# Patient Record
Sex: Female | Born: 1945 | Race: White | Hispanic: No | Marital: Married | Smoking: Former smoker
Health system: Southern US, Community
[De-identification: ages and names within clinical notes are randomized; demographics above are authoritative.]

## PROBLEM LIST (undated history)

## (undated) ENCOUNTER — Ambulatory Visit (HOSPITAL_COMMUNITY): Admission: EM | Discharge status: Absent without leave | Payer: Medicare Other

## (undated) DIAGNOSIS — L719 Rosacea, unspecified: Secondary | ICD-10-CM

## (undated) DIAGNOSIS — F419 Anxiety disorder, unspecified: Secondary | ICD-10-CM

## (undated) DIAGNOSIS — I499 Cardiac arrhythmia, unspecified: Secondary | ICD-10-CM

## (undated) DIAGNOSIS — E039 Hypothyroidism, unspecified: Secondary | ICD-10-CM

## (undated) DIAGNOSIS — R269 Unspecified abnormalities of gait and mobility: Secondary | ICD-10-CM

## (undated) DIAGNOSIS — D759 Disease of blood and blood-forming organs, unspecified: Secondary | ICD-10-CM

## (undated) DIAGNOSIS — I4891 Unspecified atrial fibrillation: Secondary | ICD-10-CM

## (undated) DIAGNOSIS — I10 Essential (primary) hypertension: Secondary | ICD-10-CM

## (undated) DIAGNOSIS — C50919 Malignant neoplasm of unspecified site of unspecified female breast: Secondary | ICD-10-CM

## (undated) DIAGNOSIS — D649 Anemia, unspecified: Secondary | ICD-10-CM

## (undated) DIAGNOSIS — J45909 Unspecified asthma, uncomplicated: Secondary | ICD-10-CM

## (undated) DIAGNOSIS — J349 Unspecified disorder of nose and nasal sinuses: Secondary | ICD-10-CM

## (undated) DIAGNOSIS — C787 Secondary malignant neoplasm of liver and intrahepatic bile duct: Secondary | ICD-10-CM

## (undated) DIAGNOSIS — J189 Pneumonia, unspecified organism: Secondary | ICD-10-CM

## (undated) DIAGNOSIS — K759 Inflammatory liver disease, unspecified: Secondary | ICD-10-CM

## (undated) DIAGNOSIS — M1990 Unspecified osteoarthritis, unspecified site: Secondary | ICD-10-CM

## (undated) DIAGNOSIS — F32A Depression, unspecified: Secondary | ICD-10-CM

## (undated) HISTORY — PX: COLONOSCOPY: SHX174

## (undated) HISTORY — PX: APPENDECTOMY: SHX54

## (undated) HISTORY — PX: THYROIDECTOMY: SHX17

## (undated) HISTORY — DX: Unspecified abnormalities of gait and mobility: R26.9

## (undated) HISTORY — PX: EYE SURGERY: SHX253

## (undated) HISTORY — DX: Secondary malignant neoplasm of liver and intrahepatic bile duct: C78.7

## (undated) HISTORY — PX: ABDOMINAL HYSTERECTOMY: SHX81

## (undated) HISTORY — PX: BREAST LUMPECTOMY: SHX2

## (undated) HISTORY — DX: Rosacea, unspecified: L71.9

## (undated) HISTORY — PX: TONSILLECTOMY: SUR1361

## (undated) HISTORY — DX: Unspecified atrial fibrillation: I48.91

## (undated) MED FILL — Trastuzumab-anns For IV Soln 150 MG: INTRAVENOUS | Qty: 18 | Status: AC

## (undated) MED FILL — Trastuzumab-anns For IV Soln 420 MG: INTRAVENOUS | Qty: 18 | Status: AC

---

## 1988-01-21 DIAGNOSIS — C541 Malignant neoplasm of endometrium: Secondary | ICD-10-CM

## 1988-01-21 HISTORY — DX: Malignant neoplasm of endometrium: C54.1

## 1997-10-22 DIAGNOSIS — S42409A Unspecified fracture of lower end of unspecified humerus, initial encounter for closed fracture: Secondary | ICD-10-CM | POA: Insufficient documentation

## 1997-10-22 HISTORY — DX: Unspecified fracture of lower end of unspecified humerus, initial encounter for closed fracture: S42.409A

## 2001-02-16 ENCOUNTER — Encounter: Admission: RE | Admit: 2001-02-16 | Discharge: 2001-02-16 | Payer: Self-pay | Admitting: Specialist

## 2001-02-16 ENCOUNTER — Encounter: Payer: Self-pay | Admitting: Specialist

## 2002-08-30 ENCOUNTER — Encounter (INDEPENDENT_AMBULATORY_CARE_PROVIDER_SITE_OTHER): Payer: Self-pay | Admitting: *Deleted

## 2002-08-30 ENCOUNTER — Encounter: Admission: RE | Admit: 2002-08-30 | Discharge: 2002-08-30 | Payer: Self-pay | Admitting: *Deleted

## 2002-08-30 ENCOUNTER — Other Ambulatory Visit: Admission: RE | Admit: 2002-08-30 | Discharge: 2002-08-30 | Payer: Self-pay | Admitting: Radiology

## 2002-09-13 ENCOUNTER — Encounter: Admission: RE | Admit: 2002-09-13 | Discharge: 2002-09-13 | Payer: Self-pay | Admitting: *Deleted

## 2002-09-13 ENCOUNTER — Encounter (INDEPENDENT_AMBULATORY_CARE_PROVIDER_SITE_OTHER): Payer: Self-pay | Admitting: *Deleted

## 2002-09-13 ENCOUNTER — Ambulatory Visit (HOSPITAL_BASED_OUTPATIENT_CLINIC_OR_DEPARTMENT_OTHER): Admission: RE | Admit: 2002-09-13 | Discharge: 2002-09-13 | Payer: Self-pay | Admitting: *Deleted

## 2002-09-27 ENCOUNTER — Ambulatory Visit: Admission: RE | Admit: 2002-09-27 | Discharge: 2002-10-08 | Payer: Self-pay | Admitting: Radiation Oncology

## 2002-10-10 ENCOUNTER — Encounter (HOSPITAL_COMMUNITY): Payer: Self-pay | Admitting: Oncology

## 2002-10-10 ENCOUNTER — Ambulatory Visit (HOSPITAL_COMMUNITY): Admission: RE | Admit: 2002-10-10 | Discharge: 2002-10-10 | Payer: Self-pay | Admitting: Oncology

## 2002-10-16 ENCOUNTER — Encounter (INDEPENDENT_AMBULATORY_CARE_PROVIDER_SITE_OTHER): Payer: Self-pay | Admitting: Cardiology

## 2002-10-16 ENCOUNTER — Ambulatory Visit: Admission: RE | Admit: 2002-10-16 | Discharge: 2002-10-16 | Payer: Self-pay | Admitting: Oncology

## 2002-10-17 ENCOUNTER — Encounter (HOSPITAL_COMMUNITY): Payer: Self-pay | Admitting: Oncology

## 2002-10-17 ENCOUNTER — Ambulatory Visit (HOSPITAL_COMMUNITY): Admission: RE | Admit: 2002-10-17 | Discharge: 2002-10-17 | Payer: Self-pay | Admitting: Oncology

## 2002-10-24 DIAGNOSIS — C50919 Malignant neoplasm of unspecified site of unspecified female breast: Secondary | ICD-10-CM | POA: Insufficient documentation

## 2003-01-03 ENCOUNTER — Ambulatory Visit: Admission: RE | Admit: 2003-01-03 | Discharge: 2003-02-12 | Payer: Self-pay | Admitting: Radiation Oncology

## 2003-01-29 ENCOUNTER — Encounter: Admission: RE | Admit: 2003-01-29 | Discharge: 2003-01-29 | Payer: Self-pay | Admitting: Radiation Oncology

## 2004-02-26 ENCOUNTER — Encounter: Admission: RE | Admit: 2004-02-26 | Discharge: 2004-02-26 | Payer: Self-pay | Admitting: *Deleted

## 2004-05-05 ENCOUNTER — Ambulatory Visit (HOSPITAL_COMMUNITY): Admission: RE | Admit: 2004-05-05 | Discharge: 2004-05-05 | Payer: Self-pay | Admitting: Oncology

## 2004-07-13 ENCOUNTER — Ambulatory Visit: Payer: Self-pay | Admitting: Oncology

## 2004-09-10 ENCOUNTER — Ambulatory Visit: Payer: Self-pay | Admitting: Oncology

## 2004-11-16 ENCOUNTER — Ambulatory Visit: Payer: Self-pay | Admitting: Oncology

## 2005-01-14 ENCOUNTER — Ambulatory Visit: Payer: Self-pay | Admitting: Oncology

## 2005-03-08 ENCOUNTER — Encounter: Admission: RE | Admit: 2005-03-08 | Discharge: 2005-03-08 | Payer: Self-pay | Admitting: Internal Medicine

## 2005-03-21 ENCOUNTER — Encounter: Admission: RE | Admit: 2005-03-21 | Discharge: 2005-03-21 | Payer: Self-pay | Admitting: Specialist

## 2005-03-21 ENCOUNTER — Ambulatory Visit: Payer: Self-pay | Admitting: Oncology

## 2005-05-24 ENCOUNTER — Ambulatory Visit: Payer: Self-pay | Admitting: Oncology

## 2005-07-26 ENCOUNTER — Ambulatory Visit: Payer: Self-pay | Admitting: Oncology

## 2005-09-05 ENCOUNTER — Ambulatory Visit: Payer: Self-pay | Admitting: Oncology

## 2005-09-27 ENCOUNTER — Ambulatory Visit: Payer: Self-pay | Admitting: Oncology

## 2005-11-09 ENCOUNTER — Ambulatory Visit: Payer: Self-pay | Admitting: Oncology

## 2005-11-29 ENCOUNTER — Ambulatory Visit: Payer: Self-pay | Admitting: Oncology

## 2005-12-06 ENCOUNTER — Ambulatory Visit: Payer: Self-pay | Admitting: Oncology

## 2006-02-01 ENCOUNTER — Ambulatory Visit: Payer: Self-pay | Admitting: Oncology

## 2006-02-21 ENCOUNTER — Ambulatory Visit: Payer: Self-pay | Admitting: Oncology

## 2006-03-01 ENCOUNTER — Ambulatory Visit: Payer: Self-pay | Admitting: Oncology

## 2006-03-10 LAB — CBC WITH DIFFERENTIAL/PLATELET
BASO%: 0.2 % (ref 0.0–2.0)
Basophils Absolute: 0 10*3/uL (ref 0.0–0.1)
EOS%: 2.5 % (ref 0.0–7.0)
Eosinophils Absolute: 0.2 10*3/uL (ref 0.0–0.5)
HCT: 39.9 % (ref 34.8–46.6)
HGB: 13.5 g/dL (ref 11.6–15.9)
LYMPH%: 33 % (ref 14.0–48.0)
MCH: 31.3 pg (ref 26.0–34.0)
MCHC: 33.8 g/dL (ref 32.0–36.0)
MCV: 92.8 fL (ref 81.0–101.0)
MONO#: 0.6 10*3/uL (ref 0.1–0.9)
MONO%: 8.9 % (ref 0.0–13.0)
NEUT#: 3.6 10*3/uL (ref 1.5–6.5)
NEUT%: 55.4 % (ref 39.6–76.8)
Platelets: 166 10*3/uL (ref 145–400)
RBC: 4.3 10*6/uL (ref 3.70–5.32)
RDW: 12.9 % (ref 11.3–14.5)
WBC: 6.5 10*3/uL (ref 3.9–10.0)
lymph#: 2.1 10*3/uL (ref 0.9–3.3)

## 2006-03-10 LAB — COMPREHENSIVE METABOLIC PANEL
ALT: 19 U/L (ref 0–40)
AST: 25 U/L (ref 0–37)
Albumin: 4 g/dL (ref 3.5–5.2)
Alkaline Phosphatase: 56 U/L (ref 39–117)
BUN: 10 mg/dL (ref 6–23)
CO2: 29 mEq/L (ref 19–32)
Calcium: 8.5 mg/dL (ref 8.4–10.5)
Chloride: 98 mEq/L (ref 96–112)
Creatinine, Ser: 0.89 mg/dL (ref 0.40–1.20)
Glucose, Bld: 88 mg/dL (ref 70–99)
Potassium: 4.5 mEq/L (ref 3.5–5.3)
Sodium: 137 mEq/L (ref 135–145)
Total Bilirubin: 0.4 mg/dL (ref 0.3–1.2)
Total Protein: 6.4 g/dL (ref 6.0–8.3)

## 2006-03-10 LAB — LACTATE DEHYDROGENASE: LDH: 230 U/L (ref 94–250)

## 2006-03-10 LAB — CEA: CEA: 2.7 ng/mL (ref 0.0–5.0)

## 2006-03-10 LAB — CANCER ANTIGEN 27.29: CA 27.29: 40 U/mL — ABNORMAL HIGH (ref 0–39)

## 2006-03-13 ENCOUNTER — Encounter: Admission: RE | Admit: 2006-03-13 | Discharge: 2006-03-13 | Payer: Self-pay | Admitting: Internal Medicine

## 2006-05-16 ENCOUNTER — Ambulatory Visit: Payer: Self-pay | Admitting: Oncology

## 2006-06-15 ENCOUNTER — Encounter: Admission: RE | Admit: 2006-06-15 | Discharge: 2006-06-15 | Payer: Self-pay | Admitting: Specialist

## 2006-07-18 ENCOUNTER — Ambulatory Visit: Payer: Self-pay | Admitting: Oncology

## 2006-09-22 ENCOUNTER — Ambulatory Visit: Payer: Self-pay | Admitting: Oncology

## 2006-11-07 ENCOUNTER — Ambulatory Visit: Payer: Self-pay | Admitting: Oncology

## 2007-03-21 ENCOUNTER — Encounter: Admission: RE | Admit: 2007-03-21 | Discharge: 2007-03-21 | Payer: Self-pay | Admitting: Internal Medicine

## 2007-04-10 ENCOUNTER — Inpatient Hospital Stay (HOSPITAL_COMMUNITY): Admission: RE | Admit: 2007-04-10 | Discharge: 2007-04-14 | Payer: Self-pay | Admitting: Specialist

## 2007-05-17 ENCOUNTER — Ambulatory Visit: Payer: Self-pay | Admitting: Oncology

## 2007-07-27 ENCOUNTER — Ambulatory Visit: Payer: Self-pay | Admitting: Oncology

## 2007-10-25 ENCOUNTER — Ambulatory Visit: Payer: Self-pay | Admitting: Oncology

## 2008-04-16 ENCOUNTER — Encounter: Admission: RE | Admit: 2008-04-16 | Discharge: 2008-04-16 | Payer: Self-pay | Admitting: Internal Medicine

## 2008-04-30 ENCOUNTER — Ambulatory Visit: Payer: Self-pay | Admitting: Oncology

## 2008-09-19 ENCOUNTER — Encounter: Admission: RE | Admit: 2008-09-19 | Discharge: 2008-09-19 | Payer: Self-pay | Admitting: Specialist

## 2008-10-18 ENCOUNTER — Ambulatory Visit (HOSPITAL_COMMUNITY): Admission: RE | Admit: 2008-10-18 | Discharge: 2008-10-18 | Payer: Self-pay | Admitting: Specialist

## 2009-04-21 ENCOUNTER — Encounter: Admission: RE | Admit: 2009-04-21 | Discharge: 2009-04-21 | Payer: Self-pay | Admitting: Oncology

## 2009-08-22 HISTORY — PX: LUMBAR SPINE SURGERY: SHX701

## 2009-08-22 HISTORY — PX: THORACIC SPINE SURGERY: SHX802

## 2010-04-30 ENCOUNTER — Encounter: Admission: RE | Admit: 2010-04-30 | Discharge: 2010-04-30 | Payer: Self-pay | Admitting: Oncology

## 2010-09-12 ENCOUNTER — Encounter: Payer: Self-pay | Admitting: Internal Medicine

## 2010-09-12 ENCOUNTER — Encounter: Payer: Self-pay | Admitting: Specialist

## 2010-12-31 ENCOUNTER — Other Ambulatory Visit: Payer: Self-pay | Admitting: *Deleted

## 2010-12-31 DIAGNOSIS — M8580 Other specified disorders of bone density and structure, unspecified site: Secondary | ICD-10-CM

## 2011-01-04 ENCOUNTER — Ambulatory Visit
Admission: RE | Admit: 2011-01-04 | Discharge: 2011-01-04 | Disposition: A | Discharge status: Home / Self Care | Payer: Medicare Other | Source: Ambulatory Visit | Attending: *Deleted | Admitting: *Deleted

## 2011-01-04 ENCOUNTER — Other Ambulatory Visit: Payer: Self-pay | Admitting: Internal Medicine

## 2011-01-04 DIAGNOSIS — M8580 Other specified disorders of bone density and structure, unspecified site: Secondary | ICD-10-CM

## 2011-01-04 NOTE — Op Note (Signed)
Karen Allison, WHITEHAIR              ACCOUNT NO.:  [id number]   MEDICAL RECORD NO.:  [id number]          PATIENT TYPE:  INP   LOCATION:  5036                         FACILITY:  [facility]   PHYSICIAN:  Kerrin Champagne, M.D.   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   PROCEDURE:  1. Central laminectomy L4-5 and L5-S1.  2. Right L4 and right L5 foraminotomy.  3. Repair of dural tear right L5-S1 lateral thecal sac using 4-0      Nurolon and Tisseel.   SURGEON:  Kerrin Champagne, M.D.   ASSISTANT:  Boneta Lucks, CRNA   ANESTHESIA:  General via orotracheal intubation.   ANESTHESIOLOGIST:  Kaylyn Layer. Michelle Piper, M.D.   FINDINGS:  Severe lumbar spinal stenosis centrally at L4-5, moderate L5-  S1 with severe right lateral recess stenosis L5-S1 secondary to  hypertrophic changes involving the right L5-S1 facet and pressing upon  the L5 nerve root, neural foramen and lateral recess, and the lateral  aspect of the thecal sac at L5-S1 level.   SPECIMENS:  None.   ESTIMATED BLOOD LOSS:  200 cc.   COMPLICATIONS:  Dural tear, right, L5-S1 lateral aspect of the thecal  sac, primarily a bleb with arachnoid bulging significant, no significant  CSF leak.  Repaired with 4-0 Nurolon and Tisseel.  Location was just  below the right L5 neural foramen within the lateral recess entry point.  The patient returned to the PACU in good condition.   HISTORY OF PRESENT ILLNESS:  The patient is a 65 year old female whom I  have been followed for several years for the problem of worsening  neurogenic claudication associated with severe lumbar spinal stenosis at  L4-5 and L5-S1 at the lower end of a collapsing degenerative scoliosis.  She has been seen by several surgeons who have evaluated her and have  suggested long segment thoracolumbar fusion with decompression.  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  The primary portion of her  spinal stenosis and collapsing degenerative curve is in the mid to upper  lumbar segments so that a decompression is to be carried out primarily  with the lower 2 lumbar segments without fusion in the hopes of allowing  return of function and decreasing morbidity associated with the  procedure.   INTRAOPERATIVE FINDINGS:  As above, the patient did  have a dural tear on  the right side while decompressing the lateral recess.  This occurred  over the lateral aspect of the thecal sac, primarily a bleb in its  configuration repaired with 4-0 Nurolon and Tisseel.   PROCEDURE:  After adequate general anesthesia with the patient in a  prone position, and the Wilson frame was used as well as standard  reverse Skytron.  All pressure points were well padded.  She had a Foley  catheter placed prior to being turned into the prone position.  Intubation was achieved without difficulty.  TED hose and PAS stockings  were used to try to prevent deep venous thrombosis.  Standard prep with  DuraPrep solution from the mid-thoracic level to the mid-sacral segment.  Draped in the usual manner.  A Vi-drape was used.  Standard preoperative  antibiotics and Ancef given.   The patient underwent placement of the spinal  needles at the expected L4  and L5 levels.  The spinal needles were found to be present at the L3  level and at the L4 level.  The incision was then varied inferiorly  slightly, and the incision was a total of 3 to 3-1/2 inches in length  through the skin and subcutaneous layers down to the lumbodorsal fascia  incising both sides of the spinous processes of L3, L4, L5 and S1.  Clamps then were placed on the spinous process of L3 and L4.  Intraoperative lateral radiograph identified these levels.  They were  marked subscapularly with cautery to continue with our identification  throughout the remainder of the case.  Electrocautery and Cobbs then  used to elevate the paralumbar muscles off the posterior aspect of the  posterior elements extending from L3 to L4 to L5 and S1 both sides.  Bleeders were controlled using electrocautery.   A Boss-McCullough retractor inserted.  Leksell rongeur then used to  remove inferior 1/3 of the spinous process of L3, the entire spinous  process of L4 and of L5, thinning the central portions of the lamina to  L4 and L5 and resecting inferior portions bilaterally of the lamina of  L5 and of L4 on both sides.  A high-speed bur was used to further thin  the posterior elements as the lamina was quite thick on both sides.  This was then thinned and a 3-mm Kerrison was then introduced beneath  the lamina of L5 bilaterally and used to perform an opening in the  central lamina.  This was done using loupe magnification and a head  lamp.   We continued superiorly to the superior aspect of the L5 lamina and then  to the L4-5 level, to the L3-4 level.  Care was taken during this  portion of the procedure not to use large Kerrisons.  Three mm and 2-mm  Kerrisons were used for the initial debridement and the initial central  laminectomy.  The residual portions of the ligamentum flavum at the L5-  S1 level were then resected centrally on both sides off the medial   aspect of the facets.  Osteotomes were then used to perform partial  facetectomy on both the right and the left side in order to further  decompress the lateral recess on the right side and left side.  This was  continued superiorly to the L4 level.  Again central laminectomy was  carried superiorly using 3 and 2-mm Kerrisons.  The ligamentum flavum  was then debrided at the L4-5 level off the medial aspects of the  facets  on both sides, found to be quite hypertrophic and pressing upon the  thecal sac centrally.  At the L3-4 level the central laminectomy was  continued upwards, and the ligamentum flavum was debrided medially off  the medial aspect of the facet at the L3-4 level on both sides  completing the decompression centrally.  Gelfoam thrombin-soaked was  used for hemostasis as well as bone wax applied to the cancellous bone  surfaces.   Foraminotomy was then performed on the right side at the L3-4 level, at  the L4-5 level and L5-S1.  Foraminotomy was performed over the S1 nerve  root and then the lateral recess decompressed on the right side at L5-S1  during the process of this removing the medial aspect of the facet and  hypertrophic ligamentum flavum a dural rent occurred involving primarily  the dura mater along the right side between both the L5 and the S1 nerve  roots.  This involved primarily the outer layers of the dura.  It did  not appear to involve any neural elements.  This was repaired by  bringing in the operating room microscope.  Nurolon 4-0 stay stitches  were then placed distal and proximal to the dural opening and then  sutures in individual simple fashion using 4-0 Nurolon were used to  close the dural tear in the dura mater.  This completed then, Valsalva  was performed at 30 mmHg and no leakage was found to be present.   The continuation of the foraminotomy was then carried out on the right  side using the 2-mm and 3-mm Kerrison over the right L5 nerve root   resecting the superior articular process of S1 as it impinged on the L5  nerve root.  When decompression was completed, a hockey stick neural  probe could be passed out over the posterior aspect of the L5 nerve root  indicating that it was well decompressed.  Continuing up the right side  to the L4-5 level then, the foraminotomy was performed over the right L4  nerve root decompressing the lateral recess at the L4-5 level and then  resecting the superior articular process of L5 with the neural foramen  decompressing the right L4 nerve root such that a hockey stick neural  probe could be easily passed out the neural foramen over the L4 nerve  root indicating patency of the neural foramen well decompressed.  At L3-4 the lateral recess was further decompressed.  Bone wax was  applied to the cancellous bone surfaces.  Excess bone wax was removed.  Gelfoam irrigation was performed carefully.   Careful hemostasis was obtained using thrombin-soaked Gelfoam.  All  Gelfoam was then removed.  There was no bleeding evident.  Tisseel was  then applied to the posterior right side of the thecal sac at the area  of the dural tear extending from the neural foramen of L5 to the mid-  area between the S1 nerve root and the L5 nerve root, and over the  posterior aspect of the dural sac.  Note that normal pulsation was  occurring in the CSF fluid at this point.  Following application of  Tisseel, again Valsalva was performed and there was no evidence of leak  present.   This completed the surgical decompression and repair of the dural tear  present.  The Boss- McCullough retractor was removed.  Bleeding was  controlled using regular electrocautery.  When the area was totally dry,  then the paralumbar muscles were reapproximated in  the midline with  interrupted #1 Vicryl sutures.  The lumbodorsal fascia was approximated  in the midline with interrupted simple and figure-of-eight sutures of #1  Vicryl.  The  deep subcutaneous layers were approximated with interrupted  #1 and #0 Vicryl sutures, the more superficial layers with interrupted 2-  0 Vicryl sutures, and the skin was closed with a running subcuticular  stitch of 4-0 Vicryl.  Dermabond was then applied, 4 x 4's and ABD pad  fixed to the skin with Ibufix tape.  The patient was then returned to a  supine position, reactivated, extubated and returned to the recovery  room in satisfactory condition.   Note that all instrument and sponge counts were correct.  Note the  operating room microscope was used during the final portions of the  procedure including foraminotomy as well as repair of dural tear of  right L5-S1.      Kerrin Champagne, M.D.  Electronically Signed     JEN/MEDQ  D:  04/10/2007  T:  04/11/2007  Job:  [id number]

## 2011-01-07 NOTE — Discharge Summary (Signed)
Karen Allison, Karen Allison              ACCOUNT NO.:  [id number]   MEDICAL RECORD NO.:  [id number]          PATIENT TYPE:  INP   LOCATION:  5036                         FACILITY:  [facility]   PHYSICIAN:  Kerrin Champagne, M.D.   DATE OF BIRTH:  1945/10/02   DATE OF ADMISSION:  04/10/2007  DATE OF DISCHARGE:  04/14/2007                               DISCHARGE SUMMARY   ADMISSION DIAGNOSES:  1. Severe lumbar spinal stenosis, L4-5, L5-S1 with right L4 and right      L5 neural foraminal stenosis.  2. Breast cancer status post lumpectomy with metastatic disease of the      liver.  3. Uterine cancer status post hysterectomy.  4. Status post stab total thyroidectomy.  5. Hypertension.  6. Chronic pain syndrome  7. Migraine headaches.  8. Depression  9. Osteopenia.  10.Gastroesophageal reflux disease.  11.Urinary retention.   DISCHARGE DIAGNOSES:  1. Severe lumbar spinal stenosis, L4-5 and L5-S1, with right L4 and L5      neural foraminal stenosis with lateral recess  Stenosis, severe, with right-sided L5-S1 dural tear, right lateral L5-S1  thecal sac just below the L5 neural foramen, lateral aspect of the  thecal sac involving primarily the dura mater not the arachnoid layer.  1. Breast cancer status post lumpectomy with metastatic disease of the      liver.  2. Uterine cancer status post hysterectomy.  3. Status post stab total thyroidectomy.  4. Hypertension.  5. Chronic pain syndrome  6. Migraine headaches.  7. Depression  8. Osteopenia.  9. Gastroesophageal reflux disease.  10.Urinary retention.  11.Posthemorrhagic anemia.   PROCEDURE:  On April 10, 2007, the patient underwent central  laminectomy, L4-5 and L5-S1.  Right L4 and right L5 foraminotomy.  Repair of dural tear, right L5-S1, lateral thecal sac using 4-0 Nurolon  and Tisseel, performed by Dr. Otelia Sergeant under general anesthesia.   CONSULTATIONS:  None.   BRIEF HISTORY:  The patient is a 65 year old female with worsening  neurogenic claudication associated with severe lumbar spinal stenosis at  L4-5 and L5-S1 at the lower end of a collapsing degenerative scoliosis.  The patient has been evaluated and has also been advised that a long  segment lower columnar fusion with decompression may be helpful for her  problems.  However, she did not desire long segment fusion.  She has  been experiencing problems with incontinence and increasing severe right  leg pain with weakness in the right leg and foot.  It was felt that she  would benefit from a decompression primarily at the lower two lumbar  segments without fusion in hopes of allowing return of function and  decreasing morbidity associated with the procedure.  The patient wished  to proceed and was admitted for this procedure.   BRIEF HOSPITAL COURSE:  The patient tolerated the procedure under  general anesthesia without complications.  The patient was started on  physical therapy for ambulation and gait training on the first  postoperative day.  She also received occupational therapies for ADLs.  The patient's hemoglobin and hematocrit postoperatively were 10 and 30,  respectively.  Values continued to drift downward to the lowest value of  9.2 and 29, respectively.  She was placed on Trinsicon and did not  require blood transfusion.  The patient's dressing was changed on the  second postoperative day, and her wound was found to be healing without  drainage, erythema, or edema throughout the hospital stay.  The patient  was able to have a bowel movement on the first postoperative day, and  her diet was advanced after this.  No nausea or vomiting during the  hospital stay, and she did tolerate a regular diet.  Foley catheter was  discontinued, and the patient was able to void without difficulty.  Prior to discharge home, the patient was seen by the physical therapist  for ambulating stairs.  She was able to do so independently.  All goals  were met from  occupational and physical therapy prior to discharge.  She  was discharged to home in stable condition on April 14, 2007.   PERTINENT LABORATORY VALUES:  Hemoglobin and hematocrit, as stated  above.  Chemistry studies on admission were within normal limits.  Postoperatively, BUN 5, remaining values normal.  On April 12, 2007,  CO2 33, BUN 3, remaining values within normal limits.   Chest x-ray on admission with no active cardiopulmonary disease,  thoracic spondylosis noted.   PLAN:  The patient was discharged to home.  She had all of the necessary  DME available at her home and therefore did not need any further items  ordered.  She was instructed to change her dressing daily.  She will be  allowed to shower.  No lifting for 6 weeks, no driving for 2 weeks.  She  is to avoid bending, lifting, or twisting.  She will resume a regular  diet.  Follow up with Dr. Otelia Sergeant 2 weeks from the date of surgery.   DISCHARGE MEDICATIONS:  1. OxyContin 20 mg p.o. q.12h., #60.  2. OxyIR 5 mg 1 every 4-6 hours as needed for breakthrough pain, #60.  3. Robaxin 500 mg 1 every 8 hours as needed for spasm, #90 with 3      refills.   The patient received a medication reconciliation form and will continue  on all of her home medications as taken prior to admission.  She also  received a prescription for Trinsicon 1 p.o. daily.  All questions  encouraged and answered.      Wende Neighbors, P.A.      Kerrin Champagne, M.D.  Electronically Signed   SMV/MEDQ  D:  06/25/2007  T:  06/26/2007  Job:  [id number]

## 2011-03-18 ENCOUNTER — Other Ambulatory Visit: Payer: Self-pay | Admitting: Orthopedic Surgery

## 2011-03-18 DIAGNOSIS — M4712 Other spondylosis with myelopathy, cervical region: Secondary | ICD-10-CM

## 2011-03-18 DIAGNOSIS — M48061 Spinal stenosis, lumbar region without neurogenic claudication: Secondary | ICD-10-CM

## 2011-03-24 ENCOUNTER — Ambulatory Visit
Admission: RE | Admit: 2011-03-24 | Discharge: 2011-03-24 | Disposition: A | Discharge status: Home / Self Care | Payer: BLUE CROSS/BLUE SHIELD | Source: Ambulatory Visit | Attending: Orthopedic Surgery | Admitting: Orthopedic Surgery

## 2011-03-24 ENCOUNTER — Ambulatory Visit
Admission: RE | Admit: 2011-03-24 | Discharge: 2011-03-24 | Disposition: A | Discharge status: Home / Self Care | Payer: Medicare Other | Source: Ambulatory Visit | Attending: Orthopedic Surgery | Admitting: Orthopedic Surgery

## 2011-03-24 DIAGNOSIS — M4712 Other spondylosis with myelopathy, cervical region: Secondary | ICD-10-CM

## 2011-03-24 DIAGNOSIS — M48061 Spinal stenosis, lumbar region without neurogenic claudication: Secondary | ICD-10-CM

## 2011-05-16 ENCOUNTER — Other Ambulatory Visit: Payer: Self-pay | Admitting: Oncology

## 2011-05-16 DIAGNOSIS — Z1231 Encounter for screening mammogram for malignant neoplasm of breast: Secondary | ICD-10-CM

## 2011-05-17 ENCOUNTER — Ambulatory Visit: Payer: Medicare Other

## 2011-06-03 LAB — BASIC METABOLIC PANEL
BUN: 3 — ABNORMAL LOW
BUN: 5 — ABNORMAL LOW
CO2: 27
CO2: 33 — ABNORMAL HIGH
Calcium: 7.9 — ABNORMAL LOW
Calcium: 8.1 — ABNORMAL LOW
Chloride: 102
Chloride: 104
Creatinine, Ser: 0.69
Creatinine, Ser: 0.77
GFR calc Af Amer: >60
GFR calc Af Amer: >60
GFR calc non Af Amer: >60
GFR calc non Af Amer: >60
Glucose, Bld: 117 — ABNORMAL HIGH
Glucose, Bld: 129 — ABNORMAL HIGH
Potassium: 3.9
Potassium: 4.1
Sodium: 135
Sodium: 138

## 2011-06-03 LAB — HEMOGLOBIN AND HEMATOCRIT, BLOOD
HCT: 26.7 — ABNORMAL LOW
HCT: 26.9 — ABNORMAL LOW
HCT: 30 — ABNORMAL LOW
HCT: 33.4 — ABNORMAL LOW
Hemoglobin: 10.2 — ABNORMAL LOW
Hemoglobin: 11.3 — ABNORMAL LOW
Hemoglobin: 9.2 — ABNORMAL LOW
Hemoglobin: 9.3 — ABNORMAL LOW

## 2011-06-06 ENCOUNTER — Ambulatory Visit
Admission: RE | Admit: 2011-06-06 | Discharge: 2011-06-06 | Disposition: A | Discharge status: Home / Self Care | Payer: Medicare Other | Source: Ambulatory Visit | Attending: Oncology | Admitting: Oncology

## 2011-06-06 DIAGNOSIS — Z1231 Encounter for screening mammogram for malignant neoplasm of breast: Secondary | ICD-10-CM

## 2011-06-06 LAB — CBC
HCT: 40.1
Hemoglobin: 13.6
MCHC: 33.9
MCV: 90.6
Platelets: 187
RBC: 4.43
RDW: 12.7
WBC: 6

## 2011-06-06 LAB — BASIC METABOLIC PANEL
BUN: 8
CO2: 31
Calcium: 9.3
Chloride: 104
Creatinine, Ser: 0.79
GFR calc Af Amer: >60
GFR calc non Af Amer: >60
Glucose, Bld: 90
Potassium: 5
Sodium: 140

## 2011-06-06 LAB — HEPATIC FUNCTION PANEL
ALT: 21
AST: 31
Albumin: 3.8
Alkaline Phosphatase: 75
Bilirubin, Direct: <0.1
Total Bilirubin: 0.7
Total Protein: 6.7

## 2011-06-06 LAB — TYPE AND SCREEN
ABO/RH(D): O NEG
Antibody Screen: NEGATIVE

## 2011-06-06 LAB — ABO/RH: ABO/RH(D): O NEG

## 2011-06-20 ENCOUNTER — Other Ambulatory Visit: Payer: Self-pay | Admitting: Neurology

## 2011-06-20 DIAGNOSIS — Z853 Personal history of malignant neoplasm of breast: Secondary | ICD-10-CM

## 2011-06-20 DIAGNOSIS — R269 Unspecified abnormalities of gait and mobility: Secondary | ICD-10-CM

## 2011-06-20 DIAGNOSIS — M545 Low back pain, unspecified: Secondary | ICD-10-CM

## 2011-06-23 ENCOUNTER — Ambulatory Visit
Admission: RE | Admit: 2011-06-23 | Discharge: 2011-06-23 | Disposition: A | Discharge status: Home / Self Care | Payer: Medicare Other | Source: Ambulatory Visit | Attending: Neurology | Admitting: Neurology

## 2011-06-23 DIAGNOSIS — R269 Unspecified abnormalities of gait and mobility: Secondary | ICD-10-CM

## 2011-06-23 DIAGNOSIS — M545 Low back pain, unspecified: Secondary | ICD-10-CM

## 2011-06-23 DIAGNOSIS — Z853 Personal history of malignant neoplasm of breast: Secondary | ICD-10-CM

## 2011-08-23 HISTORY — PX: ORIF HUMERUS FRACTURE: SHX2126

## 2011-08-26 DIAGNOSIS — C50919 Malignant neoplasm of unspecified site of unspecified female breast: Secondary | ICD-10-CM | POA: Diagnosis not present

## 2011-08-26 DIAGNOSIS — C787 Secondary malignant neoplasm of liver and intrahepatic bile duct: Secondary | ICD-10-CM | POA: Diagnosis not present

## 2011-08-26 DIAGNOSIS — Z5111 Encounter for antineoplastic chemotherapy: Secondary | ICD-10-CM | POA: Diagnosis not present

## 2011-08-30 DIAGNOSIS — N390 Urinary tract infection, site not specified: Secondary | ICD-10-CM | POA: Diagnosis not present

## 2011-08-30 DIAGNOSIS — E0789 Other specified disorders of thyroid: Secondary | ICD-10-CM | POA: Diagnosis not present

## 2011-08-30 DIAGNOSIS — IMO0002 Reserved for concepts with insufficient information to code with codable children: Secondary | ICD-10-CM | POA: Diagnosis not present

## 2011-08-30 DIAGNOSIS — I10 Essential (primary) hypertension: Secondary | ICD-10-CM | POA: Diagnosis not present

## 2011-08-30 DIAGNOSIS — Z79899 Other long term (current) drug therapy: Secondary | ICD-10-CM | POA: Diagnosis not present

## 2011-08-30 DIAGNOSIS — F329 Major depressive disorder, single episode, unspecified: Secondary | ICD-10-CM | POA: Diagnosis not present

## 2011-09-12 DIAGNOSIS — C549 Malignant neoplasm of corpus uteri, unspecified: Secondary | ICD-10-CM | POA: Diagnosis not present

## 2011-09-16 DIAGNOSIS — C50919 Malignant neoplasm of unspecified site of unspecified female breast: Secondary | ICD-10-CM | POA: Diagnosis not present

## 2011-09-16 DIAGNOSIS — Z5111 Encounter for antineoplastic chemotherapy: Secondary | ICD-10-CM | POA: Diagnosis not present

## 2011-09-16 DIAGNOSIS — C787 Secondary malignant neoplasm of liver and intrahepatic bile duct: Secondary | ICD-10-CM | POA: Diagnosis not present

## 2011-09-21 DIAGNOSIS — M48061 Spinal stenosis, lumbar region without neurogenic claudication: Secondary | ICD-10-CM | POA: Diagnosis not present

## 2011-09-21 DIAGNOSIS — M545 Low back pain, unspecified: Secondary | ICD-10-CM | POA: Diagnosis not present

## 2011-09-21 DIAGNOSIS — M542 Cervicalgia: Secondary | ICD-10-CM | POA: Diagnosis not present

## 2011-09-21 DIAGNOSIS — IMO0002 Reserved for concepts with insufficient information to code with codable children: Secondary | ICD-10-CM | POA: Diagnosis not present

## 2011-09-29 DIAGNOSIS — E0789 Other specified disorders of thyroid: Secondary | ICD-10-CM | POA: Diagnosis not present

## 2011-10-07 DIAGNOSIS — C787 Secondary malignant neoplasm of liver and intrahepatic bile duct: Secondary | ICD-10-CM | POA: Diagnosis not present

## 2011-10-07 DIAGNOSIS — C50919 Malignant neoplasm of unspecified site of unspecified female breast: Secondary | ICD-10-CM | POA: Diagnosis not present

## 2011-10-07 DIAGNOSIS — Z5111 Encounter for antineoplastic chemotherapy: Secondary | ICD-10-CM | POA: Diagnosis not present

## 2011-10-11 DIAGNOSIS — N3020 Other chronic cystitis without hematuria: Secondary | ICD-10-CM | POA: Diagnosis not present

## 2011-10-11 DIAGNOSIS — N319 Neuromuscular dysfunction of bladder, unspecified: Secondary | ICD-10-CM | POA: Diagnosis not present

## 2011-10-11 DIAGNOSIS — N393 Stress incontinence (female) (male): Secondary | ICD-10-CM | POA: Diagnosis not present

## 2011-10-14 DIAGNOSIS — Z5111 Encounter for antineoplastic chemotherapy: Secondary | ICD-10-CM | POA: Diagnosis not present

## 2011-10-14 DIAGNOSIS — M47817 Spondylosis without myelopathy or radiculopathy, lumbosacral region: Secondary | ICD-10-CM | POA: Diagnosis not present

## 2011-10-14 DIAGNOSIS — C787 Secondary malignant neoplasm of liver and intrahepatic bile duct: Secondary | ICD-10-CM | POA: Diagnosis not present

## 2011-10-14 DIAGNOSIS — M949 Disorder of cartilage, unspecified: Secondary | ICD-10-CM | POA: Diagnosis not present

## 2011-10-14 DIAGNOSIS — Z981 Arthrodesis status: Secondary | ICD-10-CM | POA: Diagnosis not present

## 2011-10-14 DIAGNOSIS — M549 Dorsalgia, unspecified: Secondary | ICD-10-CM | POA: Diagnosis not present

## 2011-10-14 DIAGNOSIS — M899 Disorder of bone, unspecified: Secondary | ICD-10-CM | POA: Diagnosis not present

## 2011-10-14 DIAGNOSIS — C50919 Malignant neoplasm of unspecified site of unspecified female breast: Secondary | ICD-10-CM | POA: Diagnosis not present

## 2011-10-21 DIAGNOSIS — C50919 Malignant neoplasm of unspecified site of unspecified female breast: Secondary | ICD-10-CM | POA: Diagnosis not present

## 2011-10-21 DIAGNOSIS — Z5111 Encounter for antineoplastic chemotherapy: Secondary | ICD-10-CM | POA: Diagnosis not present

## 2011-10-21 DIAGNOSIS — C787 Secondary malignant neoplasm of liver and intrahepatic bile duct: Secondary | ICD-10-CM | POA: Diagnosis not present

## 2011-10-28 DIAGNOSIS — I369 Nonrheumatic tricuspid valve disorder, unspecified: Secondary | ICD-10-CM | POA: Diagnosis not present

## 2011-10-28 DIAGNOSIS — I059 Rheumatic mitral valve disease, unspecified: Secondary | ICD-10-CM | POA: Diagnosis not present

## 2011-10-28 DIAGNOSIS — C50919 Malignant neoplasm of unspecified site of unspecified female breast: Secondary | ICD-10-CM | POA: Diagnosis not present

## 2011-10-28 DIAGNOSIS — Z01810 Encounter for preprocedural cardiovascular examination: Secondary | ICD-10-CM | POA: Diagnosis not present

## 2011-11-11 DIAGNOSIS — Z5111 Encounter for antineoplastic chemotherapy: Secondary | ICD-10-CM | POA: Diagnosis not present

## 2011-11-11 DIAGNOSIS — C787 Secondary malignant neoplasm of liver and intrahepatic bile duct: Secondary | ICD-10-CM | POA: Diagnosis not present

## 2011-11-11 DIAGNOSIS — C50919 Malignant neoplasm of unspecified site of unspecified female breast: Secondary | ICD-10-CM | POA: Diagnosis not present

## 2011-11-16 DIAGNOSIS — D1801 Hemangioma of skin and subcutaneous tissue: Secondary | ICD-10-CM | POA: Diagnosis not present

## 2011-11-16 DIAGNOSIS — L570 Actinic keratosis: Secondary | ICD-10-CM | POA: Diagnosis not present

## 2011-11-16 DIAGNOSIS — B351 Tinea unguium: Secondary | ICD-10-CM | POA: Diagnosis not present

## 2011-12-02 DIAGNOSIS — Z5111 Encounter for antineoplastic chemotherapy: Secondary | ICD-10-CM | POA: Diagnosis not present

## 2011-12-02 DIAGNOSIS — C50919 Malignant neoplasm of unspecified site of unspecified female breast: Secondary | ICD-10-CM | POA: Diagnosis not present

## 2011-12-02 DIAGNOSIS — C787 Secondary malignant neoplasm of liver and intrahepatic bile duct: Secondary | ICD-10-CM | POA: Diagnosis not present

## 2011-12-23 DIAGNOSIS — C50919 Malignant neoplasm of unspecified site of unspecified female breast: Secondary | ICD-10-CM | POA: Diagnosis not present

## 2011-12-23 DIAGNOSIS — Z5111 Encounter for antineoplastic chemotherapy: Secondary | ICD-10-CM | POA: Diagnosis not present

## 2011-12-23 DIAGNOSIS — C787 Secondary malignant neoplasm of liver and intrahepatic bile duct: Secondary | ICD-10-CM | POA: Diagnosis not present

## 2011-12-29 DIAGNOSIS — IMO0002 Reserved for concepts with insufficient information to code with codable children: Secondary | ICD-10-CM | POA: Diagnosis not present

## 2011-12-29 DIAGNOSIS — I10 Essential (primary) hypertension: Secondary | ICD-10-CM | POA: Diagnosis not present

## 2011-12-29 DIAGNOSIS — F329 Major depressive disorder, single episode, unspecified: Secondary | ICD-10-CM | POA: Diagnosis not present

## 2011-12-29 DIAGNOSIS — E039 Hypothyroidism, unspecified: Secondary | ICD-10-CM | POA: Diagnosis not present

## 2012-01-04 DIAGNOSIS — R1011 Right upper quadrant pain: Secondary | ICD-10-CM | POA: Diagnosis not present

## 2012-01-04 DIAGNOSIS — R110 Nausea: Secondary | ICD-10-CM | POA: Diagnosis not present

## 2012-01-04 DIAGNOSIS — N390 Urinary tract infection, site not specified: Secondary | ICD-10-CM | POA: Diagnosis not present

## 2012-01-16 DIAGNOSIS — C787 Secondary malignant neoplasm of liver and intrahepatic bile duct: Secondary | ICD-10-CM | POA: Diagnosis not present

## 2012-01-16 DIAGNOSIS — Z5111 Encounter for antineoplastic chemotherapy: Secondary | ICD-10-CM | POA: Diagnosis not present

## 2012-01-16 DIAGNOSIS — C50919 Malignant neoplasm of unspecified site of unspecified female breast: Secondary | ICD-10-CM | POA: Diagnosis not present

## 2012-01-25 DIAGNOSIS — Z09 Encounter for follow-up examination after completed treatment for conditions other than malignant neoplasm: Secondary | ICD-10-CM | POA: Diagnosis not present

## 2012-01-25 DIAGNOSIS — C800 Disseminated malignant neoplasm, unspecified: Secondary | ICD-10-CM | POA: Diagnosis not present

## 2012-01-25 DIAGNOSIS — M4802 Spinal stenosis, cervical region: Secondary | ICD-10-CM | POA: Diagnosis not present

## 2012-01-25 DIAGNOSIS — C7800 Secondary malignant neoplasm of unspecified lung: Secondary | ICD-10-CM | POA: Diagnosis not present

## 2012-01-25 DIAGNOSIS — C50919 Malignant neoplasm of unspecified site of unspecified female breast: Secondary | ICD-10-CM | POA: Diagnosis not present

## 2012-02-03 DIAGNOSIS — C787 Secondary malignant neoplasm of liver and intrahepatic bile duct: Secondary | ICD-10-CM | POA: Diagnosis not present

## 2012-02-03 DIAGNOSIS — Z5111 Encounter for antineoplastic chemotherapy: Secondary | ICD-10-CM | POA: Diagnosis not present

## 2012-02-03 DIAGNOSIS — C50919 Malignant neoplasm of unspecified site of unspecified female breast: Secondary | ICD-10-CM | POA: Diagnosis not present

## 2012-02-24 DIAGNOSIS — Z5111 Encounter for antineoplastic chemotherapy: Secondary | ICD-10-CM | POA: Diagnosis not present

## 2012-02-24 DIAGNOSIS — C787 Secondary malignant neoplasm of liver and intrahepatic bile duct: Secondary | ICD-10-CM | POA: Diagnosis not present

## 2012-02-24 DIAGNOSIS — C50919 Malignant neoplasm of unspecified site of unspecified female breast: Secondary | ICD-10-CM | POA: Diagnosis not present

## 2012-03-16 DIAGNOSIS — C50919 Malignant neoplasm of unspecified site of unspecified female breast: Secondary | ICD-10-CM | POA: Diagnosis not present

## 2012-03-16 DIAGNOSIS — Z5111 Encounter for antineoplastic chemotherapy: Secondary | ICD-10-CM | POA: Diagnosis not present

## 2012-03-16 DIAGNOSIS — C787 Secondary malignant neoplasm of liver and intrahepatic bile duct: Secondary | ICD-10-CM | POA: Diagnosis not present

## 2012-04-03 DIAGNOSIS — E039 Hypothyroidism, unspecified: Secondary | ICD-10-CM | POA: Diagnosis not present

## 2012-04-03 DIAGNOSIS — R5381 Other malaise: Secondary | ICD-10-CM | POA: Diagnosis not present

## 2012-04-03 DIAGNOSIS — I10 Essential (primary) hypertension: Secondary | ICD-10-CM | POA: Diagnosis not present

## 2012-04-03 DIAGNOSIS — F329 Major depressive disorder, single episode, unspecified: Secondary | ICD-10-CM | POA: Diagnosis not present

## 2012-04-03 DIAGNOSIS — R5383 Other fatigue: Secondary | ICD-10-CM | POA: Diagnosis not present

## 2012-04-03 DIAGNOSIS — E785 Hyperlipidemia, unspecified: Secondary | ICD-10-CM | POA: Diagnosis not present

## 2012-04-06 DIAGNOSIS — C787 Secondary malignant neoplasm of liver and intrahepatic bile duct: Secondary | ICD-10-CM | POA: Diagnosis not present

## 2012-04-06 DIAGNOSIS — C50919 Malignant neoplasm of unspecified site of unspecified female breast: Secondary | ICD-10-CM | POA: Diagnosis not present

## 2012-04-06 DIAGNOSIS — Z5111 Encounter for antineoplastic chemotherapy: Secondary | ICD-10-CM | POA: Diagnosis not present

## 2012-04-27 DIAGNOSIS — C50919 Malignant neoplasm of unspecified site of unspecified female breast: Secondary | ICD-10-CM | POA: Diagnosis not present

## 2012-04-27 DIAGNOSIS — C787 Secondary malignant neoplasm of liver and intrahepatic bile duct: Secondary | ICD-10-CM | POA: Diagnosis not present

## 2012-05-07 DIAGNOSIS — R040 Epistaxis: Secondary | ICD-10-CM | POA: Diagnosis not present

## 2012-05-07 DIAGNOSIS — I10 Essential (primary) hypertension: Secondary | ICD-10-CM | POA: Diagnosis not present

## 2012-05-16 DIAGNOSIS — Z5111 Encounter for antineoplastic chemotherapy: Secondary | ICD-10-CM | POA: Diagnosis not present

## 2012-05-16 DIAGNOSIS — C787 Secondary malignant neoplasm of liver and intrahepatic bile duct: Secondary | ICD-10-CM | POA: Diagnosis not present

## 2012-05-16 DIAGNOSIS — C50919 Malignant neoplasm of unspecified site of unspecified female breast: Secondary | ICD-10-CM | POA: Diagnosis not present

## 2012-05-29 DIAGNOSIS — Z23 Encounter for immunization: Secondary | ICD-10-CM | POA: Diagnosis not present

## 2012-05-31 DIAGNOSIS — R040 Epistaxis: Secondary | ICD-10-CM | POA: Diagnosis not present

## 2012-06-11 DIAGNOSIS — E039 Hypothyroidism, unspecified: Secondary | ICD-10-CM | POA: Diagnosis not present

## 2012-06-11 DIAGNOSIS — I10 Essential (primary) hypertension: Secondary | ICD-10-CM | POA: Diagnosis not present

## 2012-06-11 DIAGNOSIS — F329 Major depressive disorder, single episode, unspecified: Secondary | ICD-10-CM | POA: Diagnosis not present

## 2012-06-11 DIAGNOSIS — M5137 Other intervertebral disc degeneration, lumbosacral region: Secondary | ICD-10-CM | POA: Diagnosis not present

## 2012-06-12 DIAGNOSIS — C50919 Malignant neoplasm of unspecified site of unspecified female breast: Secondary | ICD-10-CM | POA: Diagnosis not present

## 2012-06-12 DIAGNOSIS — C787 Secondary malignant neoplasm of liver and intrahepatic bile duct: Secondary | ICD-10-CM | POA: Diagnosis not present

## 2012-06-12 DIAGNOSIS — Z5111 Encounter for antineoplastic chemotherapy: Secondary | ICD-10-CM | POA: Diagnosis not present

## 2012-06-19 DIAGNOSIS — H18419 Arcus senilis, unspecified eye: Secondary | ICD-10-CM | POA: Diagnosis not present

## 2012-06-19 DIAGNOSIS — H2510 Age-related nuclear cataract, unspecified eye: Secondary | ICD-10-CM | POA: Diagnosis not present

## 2012-06-26 ENCOUNTER — Other Ambulatory Visit: Payer: Self-pay | Admitting: Internal Medicine

## 2012-06-26 DIAGNOSIS — Z1231 Encounter for screening mammogram for malignant neoplasm of breast: Secondary | ICD-10-CM

## 2012-07-06 DIAGNOSIS — Z5111 Encounter for antineoplastic chemotherapy: Secondary | ICD-10-CM | POA: Diagnosis not present

## 2012-07-06 DIAGNOSIS — C787 Secondary malignant neoplasm of liver and intrahepatic bile duct: Secondary | ICD-10-CM | POA: Diagnosis not present

## 2012-07-06 DIAGNOSIS — C50919 Malignant neoplasm of unspecified site of unspecified female breast: Secondary | ICD-10-CM | POA: Diagnosis not present

## 2012-07-08 ENCOUNTER — Emergency Department (HOSPITAL_COMMUNITY)
Admission: EM | Admit: 2012-07-08 | Discharge: 2012-07-08 | Disposition: A | Discharge status: Home Health | Payer: Medicare Other | Attending: Emergency Medicine | Admitting: Emergency Medicine

## 2012-07-08 ENCOUNTER — Encounter (HOSPITAL_COMMUNITY): Payer: Self-pay | Admitting: *Deleted

## 2012-07-08 ENCOUNTER — Emergency Department (HOSPITAL_COMMUNITY): Payer: Medicare Other

## 2012-07-08 DIAGNOSIS — E0500 Thyrotoxicosis with diffuse goiter without thyrotoxic crisis or storm: Secondary | ICD-10-CM | POA: Diagnosis not present

## 2012-07-08 DIAGNOSIS — Z853 Personal history of malignant neoplasm of breast: Secondary | ICD-10-CM | POA: Insufficient documentation

## 2012-07-08 DIAGNOSIS — W1809XA Striking against other object with subsequent fall, initial encounter: Secondary | ICD-10-CM | POA: Insufficient documentation

## 2012-07-08 DIAGNOSIS — S42213A Unspecified displaced fracture of surgical neck of unspecified humerus, initial encounter for closed fracture: Secondary | ICD-10-CM | POA: Insufficient documentation

## 2012-07-08 DIAGNOSIS — Z79899 Other long term (current) drug therapy: Secondary | ICD-10-CM | POA: Insufficient documentation

## 2012-07-08 DIAGNOSIS — S59909A Unspecified injury of unspecified elbow, initial encounter: Secondary | ICD-10-CM | POA: Diagnosis not present

## 2012-07-08 DIAGNOSIS — S42209A Unspecified fracture of upper end of unspecified humerus, initial encounter for closed fracture: Secondary | ICD-10-CM | POA: Diagnosis not present

## 2012-07-08 DIAGNOSIS — Y929 Unspecified place or not applicable: Secondary | ICD-10-CM | POA: Insufficient documentation

## 2012-07-08 DIAGNOSIS — I10 Essential (primary) hypertension: Secondary | ICD-10-CM | POA: Insufficient documentation

## 2012-07-08 DIAGNOSIS — M25539 Pain in unspecified wrist: Secondary | ICD-10-CM | POA: Diagnosis not present

## 2012-07-08 DIAGNOSIS — S6990XA Unspecified injury of unspecified wrist, hand and finger(s), initial encounter: Secondary | ICD-10-CM | POA: Diagnosis not present

## 2012-07-08 DIAGNOSIS — S4980XA Other specified injuries of shoulder and upper arm, unspecified arm, initial encounter: Secondary | ICD-10-CM | POA: Diagnosis not present

## 2012-07-08 DIAGNOSIS — S59919A Unspecified injury of unspecified forearm, initial encounter: Secondary | ICD-10-CM | POA: Diagnosis not present

## 2012-07-08 DIAGNOSIS — Y9301 Activity, walking, marching and hiking: Secondary | ICD-10-CM | POA: Insufficient documentation

## 2012-07-08 HISTORY — DX: Essential (primary) hypertension: I10

## 2012-07-08 HISTORY — DX: Malignant neoplasm of unspecified site of unspecified female breast: C50.919

## 2012-07-08 MED ORDER — IBUPROFEN 400 MG PO TABS
600.0000 mg | ORAL_TABLET | Freq: Once | ORAL | Status: AC
  Administered 2012-07-08: 600 mg via ORAL
  Filled 2012-07-08: qty 1

## 2012-07-08 MED ORDER — ONDANSETRON 4 MG PO TBDP
ORAL_TABLET | ORAL | Status: AC
  Administered 2012-07-08: 4 mg
  Filled 2012-07-08: qty 1

## 2012-07-08 MED ORDER — DOCUSATE SODIUM 100 MG PO CAPS: 100.0000 mg | ORAL_CAPSULE | Freq: Two times a day (BID) | ORAL | Status: DC

## 2012-07-08 MED ORDER — OXYCODONE-ACETAMINOPHEN 5-325 MG PO TABS: 1.0000 | ORAL_TABLET | ORAL | Status: DC | PRN

## 2012-07-08 MED ORDER — OXYCODONE-ACETAMINOPHEN 5-325 MG PO TABS
2.0000 | ORAL_TABLET | Freq: Once | ORAL | Status: AC
  Administered 2012-07-08: 2 via ORAL
  Filled 2012-07-08: qty 2

## 2012-07-08 NOTE — ED Notes (Signed)
Pt back to xray for wrist film. Ice pack applied to left shoulder

## 2012-07-08 NOTE — ED Notes (Signed)
Assisted to [facility]. Pt states she feels much better.

## 2012-07-08 NOTE — Progress Notes (Signed)
Orthopedic Tech Progress Note Patient Details:  Karen Allison Mar 15, 1946 [id number]  Ortho Devices Type of Ortho Device: Sling immobilizer Ortho Device/Splint Location: LEFT SLING IMMOBILIZER Ortho Device/Splint Interventions: Application   Cammer, Mickie Bail 07/08/2012, 1:23 PM

## 2012-07-08 NOTE — ED Notes (Signed)
Pt reports falling this am and hitting left shoulder on the door, having left arm and shoulder pain. Cms intact at triage.

## 2012-07-08 NOTE — ED Provider Notes (Signed)
History  This chart was scribed for Raeford Razor, MD by Ladona Ridgel Day, ED scribe. This patient was seen in room TR05C/TR05C and the patient's care was started at 1203.   CSN: [id number]  Arrival date & time 07/08/12  1203   First MD Initiated Contact with Patient 07/08/12 1302      Chief Complaint  Patient presents with  . Fall  . Shoulder Pain   Patient is a 66 y.o. female presenting with fall, shoulder pain, and shoulder injury. The history is provided by the patient. No language interpreter was used.  Fall The accident occurred 1 to 2 hours ago. The fall occurred while walking. She landed on a hard floor. There was no blood loss. The point of impact was the left knee, right knee and left shoulder. The pain is present in the left shoulder. The pain is moderate. She was ambulatory at the scene. There was no entrapment after the fall. Associated symptoms include numbness (numbness/tingling distal of left shoulder). Pertinent negatives include no fever, no abdominal pain, no nausea, no vomiting, no headaches and no loss of consciousness. The symptoms are aggravated by use of the injured limb. She has tried nothing for the symptoms.  Shoulder Pain Pertinent negatives include no chest pain, no abdominal pain, no headaches and no shortness of breath.  Shoulder Injury This is a new problem. The current episode started 1 to 2 hours ago. The problem occurs constantly. The problem has not changed since onset.Pertinent negatives include no chest pain, no abdominal pain, no headaches and no shortness of breath. Exacerbated by: use of injured limb. Nothing relieves the symptoms. She has tried nothing for the symptoms.    Past Medical History  Diagnosis Date  . Breast cancer   . Graves disease   . Hypertension     Past Surgical History  Procedure Date  . Lumpectomy   . Appendectomy   . Tonsillectomy   . Thyroidectomy   . Back surgery     History reviewed. No pertinent family  history.  History  Substance Use Topics  . Smoking status: Not on file  . Smokeless tobacco: Not on file  . Alcohol Use: No    OB History    Grav Para Term Preterm Abortions TAB SAB Ect Mult Living                  Review of Systems  Constitutional: Negative for fever and chills.  HENT: Negative for neck pain.   Respiratory: Negative for shortness of breath.   Cardiovascular: Negative for chest pain.  Gastrointestinal: Negative for nausea, vomiting and abdominal pain.  Musculoskeletal: Negative for back pain.       Left upper arm pain, bruising and splinting. Left wrist pain  Neurological: Positive for numbness (numbness/tingling distal of left shoulder). Negative for loss of consciousness, syncope, weakness and headaches.  All other systems reviewed and are negative.    Allergies  Review of patient's allergies indicates no known allergies.  Home Medications   Current Outpatient Rx  Name  Route  Sig  Dispense  Refill  . ATENOLOL 25 MG PO TABS   Oral   Take 25 mg by mouth daily.         . B COMPLEX PO   Oral   Take 1 capsule by mouth daily.         . BUPROPION HCL ER (XL) 150 MG PO TB24   Oral   Take 150 mg by mouth daily.         Marland Kitchen  CALCIUM PO   Oral   Take 2 tablets by mouth daily.         Marland Kitchen VITAMIN D 2000 UNITS PO CAPS   Oral   Take 1 capsule by mouth daily.         Marland Kitchen DIAZEPAM 10 MG PO TABS   Oral   Take 10 mg by mouth at bedtime as needed. For sleep         . OMEGA-3 FATTY ACIDS 1000 MG PO CAPS   Oral   Take 1 g by mouth daily.         Marland Kitchen FLUOXETINE HCL 20 MG PO CAPS   Oral   Take 20 mg by mouth daily.         Marland Kitchen GABAPENTIN 600 MG PO TABS   Oral   Take 600 mg by mouth 4 (four) times daily - after meals and at bedtime.         Marland Kitchen HYDROCODONE-ACETAMINOPHEN 7.5-325 MG PO TABS   Oral   Take 1 tablet by mouth every 6 (six) hours as needed. For pain         . KRILL OIL PO   Oral   Take 1 capsule by mouth daily.         Marland Kitchen  LEVOTHYROXINE SODIUM 50 MCG PO TABS   Oral   Take 50 mcg by mouth daily.         Marland Kitchen METHOCARBAMOL 500 MG PO TABS   Oral   Take 500 mg by mouth 4 (four) times daily.         . ADULT MULTIVITAMIN W/MINERALS CH   Oral   Take 1 tablet by mouth daily.           Triage Vitals: BP 140/62  Pulse 67  Temp 98.4 F (36.9 C) (Oral)  Resp 18  SpO2 97%  Physical Exam  Nursing note and vitals reviewed. Constitutional: She appears well-developed and well-nourished. No distress.  HENT:  Head: Normocephalic and atraumatic.  Eyes: Conjunctivae normal are normal. Right eye exhibits no discharge. Left eye exhibits no discharge.  Neck: Neck supple.  Cardiovascular: Normal rate, regular rhythm and normal heart sounds.  Exam reveals no gallop and no friction rub.   No murmur heard. Pulmonary/Chest: Effort normal and breath sounds normal. No respiratory distress.  Abdominal: Soft. She exhibits no distension. There is no tenderness.  Musculoskeletal: She exhibits tenderness. She exhibits no edema.       Deformity to proximal left humerus/shoulder with ecchymosis to left anterior shoulder. NVI distally. Tenderness to her left wrist. Right knee with no bony pain and no pain with ROM. Right calf is smaller in appearance than left calf.   Neurological: She is alert.  Skin: Skin is warm and dry.  Psychiatric: She has a normal mood and affect. Her behavior is normal. Thought content normal.    ED Course  Procedures (including critical care time) DIAGNOSTIC STUDIES: Oxygen Saturation is 97% on room air, normal by my interpretation.    COORDINATION OF CARE: At 115 PM Discussed treatment plan with patient which includes left shoulder X-ray, percocet, sling immobilizer. Patient agrees.   Labs Reviewed - No data to display Dg Wrist Complete Left  07/08/2012  *RADIOLOGY REPORT*  Clinical Data: Fall and left wrist pain.  LEFT WRIST - COMPLETE 3+ VIEW  Comparison: None.  Findings: Four views of the  wrist demonstrate severe joint space narrowing and degenerative changes at the first carpometacarpal joint.  There are also  extensive degenerative changes involving the thumb MCP joint and IP joint.  The carpal bones are intact.  No evidence for a wrist fracture.  IMPRESSION: No acute bony abnormality involving the wrist.  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   Original Report Authenticated By: Richarda Overlie, M.D.    Dg Shoulder Left  07/08/2012  *RADIOLOGY REPORT*  Clinical Data: Fall.  Left shoulder pain.  Bruising.  LEFT SHOULDER - 2+ VIEW  Comparison: None.  Findings: Comminuted proximal left humerus fracture is present. This is probably a four part fracture.  The shoulders internally rotated at the time of imaging.  The humeral head appears located on the scapular Y view.  Distal clavicle is intact.  Scapula grossly appears normal.  IMPRESSION: Comminuted proximal left humerus fracture, likely 4 part.   Original Report Authenticated By: Andreas Newport, M.D.      1. Fx humeral neck       MDM  66yF with L shoulder pain. Imaging significant for proximal L humerus fx. Closed injury. NVI intact. Plan sling and ortho FU.   I personally preformed the services scribed in my presence. The recorded information has been reviewed & is accurate. Raeford Razor, MD.          Raeford Razor, MD 07/08/12 [phone]

## 2012-07-08 NOTE — ED Notes (Signed)
Med given for c/o nausea during application of sling. States pain is better unless she is moving

## 2012-07-12 DIAGNOSIS — S42209A Unspecified fracture of upper end of unspecified humerus, initial encounter for closed fracture: Secondary | ICD-10-CM | POA: Diagnosis not present

## 2012-07-12 DIAGNOSIS — S42253A Displaced fracture of greater tuberosity of unspecified humerus, initial encounter for closed fracture: Secondary | ICD-10-CM | POA: Diagnosis not present

## 2012-07-12 DIAGNOSIS — M48061 Spinal stenosis, lumbar region without neurogenic claudication: Secondary | ICD-10-CM | POA: Diagnosis not present

## 2012-07-23 DIAGNOSIS — S42209A Unspecified fracture of upper end of unspecified humerus, initial encounter for closed fracture: Secondary | ICD-10-CM | POA: Diagnosis not present

## 2012-07-23 DIAGNOSIS — S42253A Displaced fracture of greater tuberosity of unspecified humerus, initial encounter for closed fracture: Secondary | ICD-10-CM | POA: Diagnosis not present

## 2012-07-23 DIAGNOSIS — M25539 Pain in unspecified wrist: Secondary | ICD-10-CM | POA: Diagnosis not present

## 2012-07-26 DIAGNOSIS — F329 Major depressive disorder, single episode, unspecified: Secondary | ICD-10-CM | POA: Diagnosis not present

## 2012-07-26 DIAGNOSIS — IMO0002 Reserved for concepts with insufficient information to code with codable children: Secondary | ICD-10-CM | POA: Diagnosis not present

## 2012-07-26 DIAGNOSIS — C50919 Malignant neoplasm of unspecified site of unspecified female breast: Secondary | ICD-10-CM | POA: Diagnosis not present

## 2012-07-26 DIAGNOSIS — S42309D Unspecified fracture of shaft of humerus, unspecified arm, subsequent encounter for fracture with routine healing: Secondary | ICD-10-CM | POA: Diagnosis not present

## 2012-07-26 DIAGNOSIS — IMO0001 Reserved for inherently not codable concepts without codable children: Secondary | ICD-10-CM | POA: Diagnosis not present

## 2012-07-27 DIAGNOSIS — C50919 Malignant neoplasm of unspecified site of unspecified female breast: Secondary | ICD-10-CM | POA: Diagnosis not present

## 2012-07-27 DIAGNOSIS — C787 Secondary malignant neoplasm of liver and intrahepatic bile duct: Secondary | ICD-10-CM | POA: Diagnosis not present

## 2012-07-27 DIAGNOSIS — Z5111 Encounter for antineoplastic chemotherapy: Secondary | ICD-10-CM | POA: Diagnosis not present

## 2012-07-30 DIAGNOSIS — F329 Major depressive disorder, single episode, unspecified: Secondary | ICD-10-CM | POA: Diagnosis not present

## 2012-07-30 DIAGNOSIS — C50919 Malignant neoplasm of unspecified site of unspecified female breast: Secondary | ICD-10-CM | POA: Diagnosis not present

## 2012-07-30 DIAGNOSIS — IMO0002 Reserved for concepts with insufficient information to code with codable children: Secondary | ICD-10-CM | POA: Diagnosis not present

## 2012-07-30 DIAGNOSIS — S42309D Unspecified fracture of shaft of humerus, unspecified arm, subsequent encounter for fracture with routine healing: Secondary | ICD-10-CM | POA: Diagnosis not present

## 2012-07-30 DIAGNOSIS — IMO0001 Reserved for inherently not codable concepts without codable children: Secondary | ICD-10-CM | POA: Diagnosis not present

## 2012-08-01 DIAGNOSIS — IMO0001 Reserved for inherently not codable concepts without codable children: Secondary | ICD-10-CM | POA: Diagnosis not present

## 2012-08-01 DIAGNOSIS — C50919 Malignant neoplasm of unspecified site of unspecified female breast: Secondary | ICD-10-CM | POA: Diagnosis not present

## 2012-08-01 DIAGNOSIS — S42309D Unspecified fracture of shaft of humerus, unspecified arm, subsequent encounter for fracture with routine healing: Secondary | ICD-10-CM | POA: Diagnosis not present

## 2012-08-01 DIAGNOSIS — F329 Major depressive disorder, single episode, unspecified: Secondary | ICD-10-CM | POA: Diagnosis not present

## 2012-08-01 DIAGNOSIS — IMO0002 Reserved for concepts with insufficient information to code with codable children: Secondary | ICD-10-CM | POA: Diagnosis not present

## 2012-08-02 DIAGNOSIS — S42309D Unspecified fracture of shaft of humerus, unspecified arm, subsequent encounter for fracture with routine healing: Secondary | ICD-10-CM | POA: Diagnosis not present

## 2012-08-02 DIAGNOSIS — F329 Major depressive disorder, single episode, unspecified: Secondary | ICD-10-CM | POA: Diagnosis not present

## 2012-08-02 DIAGNOSIS — C50919 Malignant neoplasm of unspecified site of unspecified female breast: Secondary | ICD-10-CM | POA: Diagnosis not present

## 2012-08-02 DIAGNOSIS — IMO0002 Reserved for concepts with insufficient information to code with codable children: Secondary | ICD-10-CM | POA: Diagnosis not present

## 2012-08-02 DIAGNOSIS — IMO0001 Reserved for inherently not codable concepts without codable children: Secondary | ICD-10-CM | POA: Diagnosis not present

## 2012-08-06 ENCOUNTER — Ambulatory Visit: Payer: Medicare Other

## 2012-08-06 DIAGNOSIS — S42209A Unspecified fracture of upper end of unspecified humerus, initial encounter for closed fracture: Secondary | ICD-10-CM | POA: Diagnosis not present

## 2012-08-07 DIAGNOSIS — F329 Major depressive disorder, single episode, unspecified: Secondary | ICD-10-CM | POA: Diagnosis not present

## 2012-08-07 DIAGNOSIS — IMO0002 Reserved for concepts with insufficient information to code with codable children: Secondary | ICD-10-CM | POA: Diagnosis not present

## 2012-08-07 DIAGNOSIS — IMO0001 Reserved for inherently not codable concepts without codable children: Secondary | ICD-10-CM | POA: Diagnosis not present

## 2012-08-07 DIAGNOSIS — C50919 Malignant neoplasm of unspecified site of unspecified female breast: Secondary | ICD-10-CM | POA: Diagnosis not present

## 2012-08-07 DIAGNOSIS — S42309D Unspecified fracture of shaft of humerus, unspecified arm, subsequent encounter for fracture with routine healing: Secondary | ICD-10-CM | POA: Diagnosis not present

## 2012-08-09 DIAGNOSIS — S42309D Unspecified fracture of shaft of humerus, unspecified arm, subsequent encounter for fracture with routine healing: Secondary | ICD-10-CM | POA: Diagnosis not present

## 2012-08-09 DIAGNOSIS — IMO0001 Reserved for inherently not codable concepts without codable children: Secondary | ICD-10-CM | POA: Diagnosis not present

## 2012-08-09 DIAGNOSIS — IMO0002 Reserved for concepts with insufficient information to code with codable children: Secondary | ICD-10-CM | POA: Diagnosis not present

## 2012-08-09 DIAGNOSIS — C50919 Malignant neoplasm of unspecified site of unspecified female breast: Secondary | ICD-10-CM | POA: Diagnosis not present

## 2012-08-09 DIAGNOSIS — F329 Major depressive disorder, single episode, unspecified: Secondary | ICD-10-CM | POA: Diagnosis not present

## 2012-08-10 DIAGNOSIS — C50919 Malignant neoplasm of unspecified site of unspecified female breast: Secondary | ICD-10-CM | POA: Diagnosis not present

## 2012-08-10 DIAGNOSIS — F329 Major depressive disorder, single episode, unspecified: Secondary | ICD-10-CM | POA: Diagnosis not present

## 2012-08-10 DIAGNOSIS — IMO0001 Reserved for inherently not codable concepts without codable children: Secondary | ICD-10-CM | POA: Diagnosis not present

## 2012-08-10 DIAGNOSIS — IMO0002 Reserved for concepts with insufficient information to code with codable children: Secondary | ICD-10-CM | POA: Diagnosis not present

## 2012-08-10 DIAGNOSIS — S42309D Unspecified fracture of shaft of humerus, unspecified arm, subsequent encounter for fracture with routine healing: Secondary | ICD-10-CM | POA: Diagnosis not present

## 2012-08-14 DIAGNOSIS — S42309D Unspecified fracture of shaft of humerus, unspecified arm, subsequent encounter for fracture with routine healing: Secondary | ICD-10-CM | POA: Diagnosis not present

## 2012-08-14 DIAGNOSIS — F329 Major depressive disorder, single episode, unspecified: Secondary | ICD-10-CM | POA: Diagnosis not present

## 2012-08-14 DIAGNOSIS — C50919 Malignant neoplasm of unspecified site of unspecified female breast: Secondary | ICD-10-CM | POA: Diagnosis not present

## 2012-08-14 DIAGNOSIS — IMO0002 Reserved for concepts with insufficient information to code with codable children: Secondary | ICD-10-CM | POA: Diagnosis not present

## 2012-08-14 DIAGNOSIS — IMO0001 Reserved for inherently not codable concepts without codable children: Secondary | ICD-10-CM | POA: Diagnosis not present

## 2012-08-16 DIAGNOSIS — IMO0002 Reserved for concepts with insufficient information to code with codable children: Secondary | ICD-10-CM | POA: Diagnosis not present

## 2012-08-16 DIAGNOSIS — C50919 Malignant neoplasm of unspecified site of unspecified female breast: Secondary | ICD-10-CM | POA: Diagnosis not present

## 2012-08-16 DIAGNOSIS — F329 Major depressive disorder, single episode, unspecified: Secondary | ICD-10-CM | POA: Diagnosis not present

## 2012-08-16 DIAGNOSIS — IMO0001 Reserved for inherently not codable concepts without codable children: Secondary | ICD-10-CM | POA: Diagnosis not present

## 2012-08-16 DIAGNOSIS — S42309D Unspecified fracture of shaft of humerus, unspecified arm, subsequent encounter for fracture with routine healing: Secondary | ICD-10-CM | POA: Diagnosis not present

## 2012-08-17 DIAGNOSIS — C787 Secondary malignant neoplasm of liver and intrahepatic bile duct: Secondary | ICD-10-CM | POA: Diagnosis not present

## 2012-08-17 DIAGNOSIS — Z5111 Encounter for antineoplastic chemotherapy: Secondary | ICD-10-CM | POA: Diagnosis not present

## 2012-08-17 DIAGNOSIS — C50919 Malignant neoplasm of unspecified site of unspecified female breast: Secondary | ICD-10-CM | POA: Diagnosis not present

## 2012-08-20 DIAGNOSIS — S42309D Unspecified fracture of shaft of humerus, unspecified arm, subsequent encounter for fracture with routine healing: Secondary | ICD-10-CM | POA: Diagnosis not present

## 2012-08-20 DIAGNOSIS — IMO0002 Reserved for concepts with insufficient information to code with codable children: Secondary | ICD-10-CM | POA: Diagnosis not present

## 2012-08-20 DIAGNOSIS — IMO0001 Reserved for inherently not codable concepts without codable children: Secondary | ICD-10-CM | POA: Diagnosis not present

## 2012-08-20 DIAGNOSIS — C50919 Malignant neoplasm of unspecified site of unspecified female breast: Secondary | ICD-10-CM | POA: Diagnosis not present

## 2012-08-20 DIAGNOSIS — F329 Major depressive disorder, single episode, unspecified: Secondary | ICD-10-CM | POA: Diagnosis not present

## 2012-08-24 DIAGNOSIS — F329 Major depressive disorder, single episode, unspecified: Secondary | ICD-10-CM | POA: Diagnosis not present

## 2012-08-24 DIAGNOSIS — S42309D Unspecified fracture of shaft of humerus, unspecified arm, subsequent encounter for fracture with routine healing: Secondary | ICD-10-CM | POA: Diagnosis not present

## 2012-08-24 DIAGNOSIS — IMO0001 Reserved for inherently not codable concepts without codable children: Secondary | ICD-10-CM | POA: Diagnosis not present

## 2012-08-24 DIAGNOSIS — IMO0002 Reserved for concepts with insufficient information to code with codable children: Secondary | ICD-10-CM | POA: Diagnosis not present

## 2012-08-24 DIAGNOSIS — C50919 Malignant neoplasm of unspecified site of unspecified female breast: Secondary | ICD-10-CM | POA: Diagnosis not present

## 2012-08-27 DIAGNOSIS — IMO0001 Reserved for inherently not codable concepts without codable children: Secondary | ICD-10-CM | POA: Diagnosis not present

## 2012-08-27 DIAGNOSIS — S42309D Unspecified fracture of shaft of humerus, unspecified arm, subsequent encounter for fracture with routine healing: Secondary | ICD-10-CM | POA: Diagnosis not present

## 2012-08-27 DIAGNOSIS — C50919 Malignant neoplasm of unspecified site of unspecified female breast: Secondary | ICD-10-CM | POA: Diagnosis not present

## 2012-08-27 DIAGNOSIS — IMO0002 Reserved for concepts with insufficient information to code with codable children: Secondary | ICD-10-CM | POA: Diagnosis not present

## 2012-08-27 DIAGNOSIS — F329 Major depressive disorder, single episode, unspecified: Secondary | ICD-10-CM | POA: Diagnosis not present

## 2012-08-29 DIAGNOSIS — S42209A Unspecified fracture of upper end of unspecified humerus, initial encounter for closed fracture: Secondary | ICD-10-CM | POA: Diagnosis not present

## 2012-08-29 DIAGNOSIS — S42253A Displaced fracture of greater tuberosity of unspecified humerus, initial encounter for closed fracture: Secondary | ICD-10-CM | POA: Diagnosis not present

## 2012-08-30 DIAGNOSIS — IMO0001 Reserved for inherently not codable concepts without codable children: Secondary | ICD-10-CM | POA: Diagnosis not present

## 2012-08-30 DIAGNOSIS — IMO0002 Reserved for concepts with insufficient information to code with codable children: Secondary | ICD-10-CM | POA: Diagnosis not present

## 2012-08-30 DIAGNOSIS — C50919 Malignant neoplasm of unspecified site of unspecified female breast: Secondary | ICD-10-CM | POA: Diagnosis not present

## 2012-08-30 DIAGNOSIS — S42309D Unspecified fracture of shaft of humerus, unspecified arm, subsequent encounter for fracture with routine healing: Secondary | ICD-10-CM | POA: Diagnosis not present

## 2012-08-30 DIAGNOSIS — F329 Major depressive disorder, single episode, unspecified: Secondary | ICD-10-CM | POA: Diagnosis not present

## 2012-09-04 DIAGNOSIS — C50919 Malignant neoplasm of unspecified site of unspecified female breast: Secondary | ICD-10-CM | POA: Diagnosis not present

## 2012-09-04 DIAGNOSIS — S42309D Unspecified fracture of shaft of humerus, unspecified arm, subsequent encounter for fracture with routine healing: Secondary | ICD-10-CM | POA: Diagnosis not present

## 2012-09-04 DIAGNOSIS — IMO0001 Reserved for inherently not codable concepts without codable children: Secondary | ICD-10-CM | POA: Diagnosis not present

## 2012-09-04 DIAGNOSIS — IMO0002 Reserved for concepts with insufficient information to code with codable children: Secondary | ICD-10-CM | POA: Diagnosis not present

## 2012-09-04 DIAGNOSIS — F329 Major depressive disorder, single episode, unspecified: Secondary | ICD-10-CM | POA: Diagnosis not present

## 2012-09-05 DIAGNOSIS — M4120 Other idiopathic scoliosis, site unspecified: Secondary | ICD-10-CM | POA: Diagnosis not present

## 2012-09-05 DIAGNOSIS — C787 Secondary malignant neoplasm of liver and intrahepatic bile duct: Secondary | ICD-10-CM | POA: Diagnosis not present

## 2012-09-05 DIAGNOSIS — IMO0002 Reserved for concepts with insufficient information to code with codable children: Secondary | ICD-10-CM | POA: Diagnosis not present

## 2012-09-05 DIAGNOSIS — Z981 Arthrodesis status: Secondary | ICD-10-CM | POA: Diagnosis not present

## 2012-09-05 DIAGNOSIS — R52 Pain, unspecified: Secondary | ICD-10-CM | POA: Diagnosis not present

## 2012-09-05 DIAGNOSIS — C50919 Malignant neoplasm of unspecified site of unspecified female breast: Secondary | ICD-10-CM | POA: Diagnosis not present

## 2012-09-05 DIAGNOSIS — C801 Malignant (primary) neoplasm, unspecified: Secondary | ICD-10-CM | POA: Diagnosis not present

## 2012-09-06 DIAGNOSIS — IMO0001 Reserved for inherently not codable concepts without codable children: Secondary | ICD-10-CM | POA: Diagnosis not present

## 2012-09-06 DIAGNOSIS — F329 Major depressive disorder, single episode, unspecified: Secondary | ICD-10-CM | POA: Diagnosis not present

## 2012-09-06 DIAGNOSIS — IMO0002 Reserved for concepts with insufficient information to code with codable children: Secondary | ICD-10-CM | POA: Diagnosis not present

## 2012-09-06 DIAGNOSIS — C50919 Malignant neoplasm of unspecified site of unspecified female breast: Secondary | ICD-10-CM | POA: Diagnosis not present

## 2012-09-06 DIAGNOSIS — S42309D Unspecified fracture of shaft of humerus, unspecified arm, subsequent encounter for fracture with routine healing: Secondary | ICD-10-CM | POA: Diagnosis not present

## 2012-09-07 DIAGNOSIS — Z5111 Encounter for antineoplastic chemotherapy: Secondary | ICD-10-CM | POA: Diagnosis not present

## 2012-09-07 DIAGNOSIS — C50919 Malignant neoplasm of unspecified site of unspecified female breast: Secondary | ICD-10-CM | POA: Diagnosis not present

## 2012-09-07 DIAGNOSIS — C787 Secondary malignant neoplasm of liver and intrahepatic bile duct: Secondary | ICD-10-CM | POA: Diagnosis not present

## 2012-09-11 DIAGNOSIS — S42293A Other displaced fracture of upper end of unspecified humerus, initial encounter for closed fracture: Secondary | ICD-10-CM | POA: Diagnosis not present

## 2012-09-11 DIAGNOSIS — M6281 Muscle weakness (generalized): Secondary | ICD-10-CM | POA: Diagnosis not present

## 2012-09-13 DIAGNOSIS — M6281 Muscle weakness (generalized): Secondary | ICD-10-CM | POA: Diagnosis not present

## 2012-09-13 DIAGNOSIS — S42293A Other displaced fracture of upper end of unspecified humerus, initial encounter for closed fracture: Secondary | ICD-10-CM | POA: Diagnosis not present

## 2012-09-14 DIAGNOSIS — C50919 Malignant neoplasm of unspecified site of unspecified female breast: Secondary | ICD-10-CM | POA: Diagnosis not present

## 2012-09-14 DIAGNOSIS — C801 Malignant (primary) neoplasm, unspecified: Secondary | ICD-10-CM | POA: Diagnosis not present

## 2012-09-14 DIAGNOSIS — C787 Secondary malignant neoplasm of liver and intrahepatic bile duct: Secondary | ICD-10-CM | POA: Diagnosis not present

## 2012-09-20 DIAGNOSIS — M6281 Muscle weakness (generalized): Secondary | ICD-10-CM | POA: Diagnosis not present

## 2012-09-20 DIAGNOSIS — S42293A Other displaced fracture of upper end of unspecified humerus, initial encounter for closed fracture: Secondary | ICD-10-CM | POA: Diagnosis not present

## 2012-09-21 DIAGNOSIS — S42253A Displaced fracture of greater tuberosity of unspecified humerus, initial encounter for closed fracture: Secondary | ICD-10-CM | POA: Diagnosis not present

## 2012-09-21 DIAGNOSIS — S42209A Unspecified fracture of upper end of unspecified humerus, initial encounter for closed fracture: Secondary | ICD-10-CM | POA: Diagnosis not present

## 2012-09-24 DIAGNOSIS — S42293A Other displaced fracture of upper end of unspecified humerus, initial encounter for closed fracture: Secondary | ICD-10-CM | POA: Diagnosis not present

## 2012-09-24 DIAGNOSIS — M6281 Muscle weakness (generalized): Secondary | ICD-10-CM | POA: Diagnosis not present

## 2012-09-25 DIAGNOSIS — M549 Dorsalgia, unspecified: Secondary | ICD-10-CM | POA: Diagnosis not present

## 2012-09-25 DIAGNOSIS — R300 Dysuria: Secondary | ICD-10-CM | POA: Diagnosis not present

## 2012-09-25 DIAGNOSIS — E039 Hypothyroidism, unspecified: Secondary | ICD-10-CM | POA: Diagnosis not present

## 2012-09-25 DIAGNOSIS — F329 Major depressive disorder, single episode, unspecified: Secondary | ICD-10-CM | POA: Diagnosis not present

## 2012-09-26 DIAGNOSIS — S42293A Other displaced fracture of upper end of unspecified humerus, initial encounter for closed fracture: Secondary | ICD-10-CM | POA: Diagnosis not present

## 2012-09-26 DIAGNOSIS — M6281 Muscle weakness (generalized): Secondary | ICD-10-CM | POA: Diagnosis not present

## 2012-09-28 DIAGNOSIS — C787 Secondary malignant neoplasm of liver and intrahepatic bile duct: Secondary | ICD-10-CM | POA: Diagnosis not present

## 2012-09-28 DIAGNOSIS — C50919 Malignant neoplasm of unspecified site of unspecified female breast: Secondary | ICD-10-CM | POA: Diagnosis not present

## 2012-09-28 DIAGNOSIS — Z5111 Encounter for antineoplastic chemotherapy: Secondary | ICD-10-CM | POA: Diagnosis not present

## 2012-10-01 DIAGNOSIS — M6281 Muscle weakness (generalized): Secondary | ICD-10-CM | POA: Diagnosis not present

## 2012-10-01 DIAGNOSIS — S42293A Other displaced fracture of upper end of unspecified humerus, initial encounter for closed fracture: Secondary | ICD-10-CM | POA: Diagnosis not present

## 2012-10-03 DIAGNOSIS — R339 Retention of urine, unspecified: Secondary | ICD-10-CM | POA: Diagnosis not present

## 2012-10-03 DIAGNOSIS — N3946 Mixed incontinence: Secondary | ICD-10-CM | POA: Diagnosis not present

## 2012-10-03 DIAGNOSIS — R3911 Hesitancy of micturition: Secondary | ICD-10-CM | POA: Diagnosis not present

## 2012-10-08 DIAGNOSIS — S42293A Other displaced fracture of upper end of unspecified humerus, initial encounter for closed fracture: Secondary | ICD-10-CM | POA: Diagnosis not present

## 2012-10-08 DIAGNOSIS — M6281 Muscle weakness (generalized): Secondary | ICD-10-CM | POA: Diagnosis not present

## 2012-10-09 DIAGNOSIS — S42293A Other displaced fracture of upper end of unspecified humerus, initial encounter for closed fracture: Secondary | ICD-10-CM | POA: Diagnosis not present

## 2012-10-09 DIAGNOSIS — M6281 Muscle weakness (generalized): Secondary | ICD-10-CM | POA: Diagnosis not present

## 2012-10-15 DIAGNOSIS — M6281 Muscle weakness (generalized): Secondary | ICD-10-CM | POA: Diagnosis not present

## 2012-10-15 DIAGNOSIS — S42293A Other displaced fracture of upper end of unspecified humerus, initial encounter for closed fracture: Secondary | ICD-10-CM | POA: Diagnosis not present

## 2012-10-17 DIAGNOSIS — M6281 Muscle weakness (generalized): Secondary | ICD-10-CM | POA: Diagnosis not present

## 2012-10-17 DIAGNOSIS — S42293A Other displaced fracture of upper end of unspecified humerus, initial encounter for closed fracture: Secondary | ICD-10-CM | POA: Diagnosis not present

## 2012-10-18 DIAGNOSIS — N3946 Mixed incontinence: Secondary | ICD-10-CM | POA: Diagnosis not present

## 2012-10-18 DIAGNOSIS — R3911 Hesitancy of micturition: Secondary | ICD-10-CM | POA: Diagnosis not present

## 2012-10-18 DIAGNOSIS — R339 Retention of urine, unspecified: Secondary | ICD-10-CM | POA: Diagnosis not present

## 2012-10-19 DIAGNOSIS — C787 Secondary malignant neoplasm of liver and intrahepatic bile duct: Secondary | ICD-10-CM | POA: Diagnosis not present

## 2012-10-19 DIAGNOSIS — Z5111 Encounter for antineoplastic chemotherapy: Secondary | ICD-10-CM | POA: Diagnosis not present

## 2012-10-19 DIAGNOSIS — C50919 Malignant neoplasm of unspecified site of unspecified female breast: Secondary | ICD-10-CM | POA: Diagnosis not present

## 2012-10-22 DIAGNOSIS — H269 Unspecified cataract: Secondary | ICD-10-CM | POA: Diagnosis not present

## 2012-10-22 DIAGNOSIS — H2510 Age-related nuclear cataract, unspecified eye: Secondary | ICD-10-CM | POA: Diagnosis not present

## 2012-10-29 DIAGNOSIS — R339 Retention of urine, unspecified: Secondary | ICD-10-CM | POA: Diagnosis not present

## 2012-10-29 DIAGNOSIS — N3946 Mixed incontinence: Secondary | ICD-10-CM | POA: Diagnosis not present

## 2012-10-29 DIAGNOSIS — R3911 Hesitancy of micturition: Secondary | ICD-10-CM | POA: Diagnosis not present

## 2012-10-30 DIAGNOSIS — H2510 Age-related nuclear cataract, unspecified eye: Secondary | ICD-10-CM | POA: Diagnosis not present

## 2012-11-09 DIAGNOSIS — Z5111 Encounter for antineoplastic chemotherapy: Secondary | ICD-10-CM | POA: Diagnosis not present

## 2012-11-09 DIAGNOSIS — I059 Rheumatic mitral valve disease, unspecified: Secondary | ICD-10-CM | POA: Diagnosis not present

## 2012-11-09 DIAGNOSIS — I369 Nonrheumatic tricuspid valve disorder, unspecified: Secondary | ICD-10-CM | POA: Diagnosis not present

## 2012-11-09 DIAGNOSIS — C50919 Malignant neoplasm of unspecified site of unspecified female breast: Secondary | ICD-10-CM | POA: Diagnosis not present

## 2012-11-09 DIAGNOSIS — C787 Secondary malignant neoplasm of liver and intrahepatic bile duct: Secondary | ICD-10-CM | POA: Diagnosis not present

## 2012-11-12 DIAGNOSIS — H2510 Age-related nuclear cataract, unspecified eye: Secondary | ICD-10-CM | POA: Diagnosis not present

## 2012-11-12 DIAGNOSIS — H269 Unspecified cataract: Secondary | ICD-10-CM | POA: Diagnosis not present

## 2012-11-22 ENCOUNTER — Other Ambulatory Visit: Payer: Self-pay | Admitting: Oncology

## 2012-11-22 DIAGNOSIS — M8580 Other specified disorders of bone density and structure, unspecified site: Secondary | ICD-10-CM

## 2012-11-26 DIAGNOSIS — R339 Retention of urine, unspecified: Secondary | ICD-10-CM | POA: Diagnosis not present

## 2012-11-26 DIAGNOSIS — R3911 Hesitancy of micturition: Secondary | ICD-10-CM | POA: Diagnosis not present

## 2012-11-26 DIAGNOSIS — N3946 Mixed incontinence: Secondary | ICD-10-CM | POA: Diagnosis not present

## 2012-11-30 DIAGNOSIS — C50919 Malignant neoplasm of unspecified site of unspecified female breast: Secondary | ICD-10-CM | POA: Diagnosis not present

## 2012-11-30 DIAGNOSIS — C787 Secondary malignant neoplasm of liver and intrahepatic bile duct: Secondary | ICD-10-CM | POA: Diagnosis not present

## 2012-11-30 DIAGNOSIS — Z5111 Encounter for antineoplastic chemotherapy: Secondary | ICD-10-CM | POA: Diagnosis not present

## 2012-12-03 DIAGNOSIS — K921 Melena: Secondary | ICD-10-CM | POA: Diagnosis not present

## 2012-12-21 DIAGNOSIS — C50919 Malignant neoplasm of unspecified site of unspecified female breast: Secondary | ICD-10-CM | POA: Diagnosis not present

## 2012-12-21 DIAGNOSIS — C787 Secondary malignant neoplasm of liver and intrahepatic bile duct: Secondary | ICD-10-CM | POA: Diagnosis not present

## 2012-12-21 DIAGNOSIS — Z5111 Encounter for antineoplastic chemotherapy: Secondary | ICD-10-CM | POA: Diagnosis not present

## 2012-12-24 ENCOUNTER — Ambulatory Visit: Payer: Medicare Other

## 2012-12-24 ENCOUNTER — Other Ambulatory Visit: Payer: Medicare Other

## 2013-01-08 DIAGNOSIS — E079 Disorder of thyroid, unspecified: Secondary | ICD-10-CM | POA: Diagnosis not present

## 2013-01-08 DIAGNOSIS — F329 Major depressive disorder, single episode, unspecified: Secondary | ICD-10-CM | POA: Diagnosis not present

## 2013-01-08 DIAGNOSIS — K5900 Constipation, unspecified: Secondary | ICD-10-CM | POA: Diagnosis not present

## 2013-01-08 DIAGNOSIS — K648 Other hemorrhoids: Secondary | ICD-10-CM | POA: Diagnosis not present

## 2013-01-08 DIAGNOSIS — Z79899 Other long term (current) drug therapy: Secondary | ICD-10-CM | POA: Diagnosis not present

## 2013-01-08 DIAGNOSIS — Z1211 Encounter for screening for malignant neoplasm of colon: Secondary | ICD-10-CM | POA: Diagnosis not present

## 2013-01-08 DIAGNOSIS — K921 Melena: Secondary | ICD-10-CM | POA: Diagnosis not present

## 2013-01-08 DIAGNOSIS — K6389 Other specified diseases of intestine: Secondary | ICD-10-CM | POA: Diagnosis not present

## 2013-01-11 DIAGNOSIS — C787 Secondary malignant neoplasm of liver and intrahepatic bile duct: Secondary | ICD-10-CM | POA: Diagnosis not present

## 2013-01-11 DIAGNOSIS — C50919 Malignant neoplasm of unspecified site of unspecified female breast: Secondary | ICD-10-CM | POA: Diagnosis not present

## 2013-01-11 DIAGNOSIS — Z5111 Encounter for antineoplastic chemotherapy: Secondary | ICD-10-CM | POA: Diagnosis not present

## 2013-01-28 DIAGNOSIS — S42253A Displaced fracture of greater tuberosity of unspecified humerus, initial encounter for closed fracture: Secondary | ICD-10-CM | POA: Diagnosis not present

## 2013-01-28 DIAGNOSIS — S42209A Unspecified fracture of upper end of unspecified humerus, initial encounter for closed fracture: Secondary | ICD-10-CM | POA: Diagnosis not present

## 2013-01-29 ENCOUNTER — Ambulatory Visit
Admission: RE | Admit: 2013-01-29 | Discharge: 2013-01-29 | Disposition: A | Discharge status: Home / Self Care | Payer: Medicare Other | Source: Ambulatory Visit | Attending: Oncology | Admitting: Oncology

## 2013-01-29 ENCOUNTER — Ambulatory Visit
Admission: RE | Admit: 2013-01-29 | Discharge: 2013-01-29 | Disposition: A | Discharge status: Home / Self Care | Payer: Medicare Other | Source: Ambulatory Visit | Attending: Internal Medicine | Admitting: Internal Medicine

## 2013-01-29 DIAGNOSIS — M8580 Other specified disorders of bone density and structure, unspecified site: Secondary | ICD-10-CM

## 2013-01-29 DIAGNOSIS — Z1231 Encounter for screening mammogram for malignant neoplasm of breast: Secondary | ICD-10-CM

## 2013-01-29 DIAGNOSIS — M949 Disorder of cartilage, unspecified: Secondary | ICD-10-CM | POA: Diagnosis not present

## 2013-01-29 DIAGNOSIS — M899 Disorder of bone, unspecified: Secondary | ICD-10-CM | POA: Diagnosis not present

## 2013-01-29 DIAGNOSIS — M810 Age-related osteoporosis without current pathological fracture: Secondary | ICD-10-CM | POA: Diagnosis not present

## 2013-02-01 DIAGNOSIS — C787 Secondary malignant neoplasm of liver and intrahepatic bile duct: Secondary | ICD-10-CM | POA: Diagnosis not present

## 2013-02-01 DIAGNOSIS — Z5111 Encounter for antineoplastic chemotherapy: Secondary | ICD-10-CM | POA: Diagnosis not present

## 2013-02-01 DIAGNOSIS — C50919 Malignant neoplasm of unspecified site of unspecified female breast: Secondary | ICD-10-CM | POA: Diagnosis not present

## 2013-02-05 ENCOUNTER — Other Ambulatory Visit: Payer: Self-pay | Admitting: Oncology

## 2013-02-21 DIAGNOSIS — Z5111 Encounter for antineoplastic chemotherapy: Secondary | ICD-10-CM | POA: Diagnosis not present

## 2013-02-21 DIAGNOSIS — C50919 Malignant neoplasm of unspecified site of unspecified female breast: Secondary | ICD-10-CM | POA: Diagnosis not present

## 2013-02-21 DIAGNOSIS — C787 Secondary malignant neoplasm of liver and intrahepatic bile duct: Secondary | ICD-10-CM | POA: Diagnosis not present

## 2013-02-21 DIAGNOSIS — M810 Age-related osteoporosis without current pathological fracture: Secondary | ICD-10-CM | POA: Diagnosis not present

## 2013-03-05 DIAGNOSIS — Z09 Encounter for follow-up examination after completed treatment for conditions other than malignant neoplasm: Secondary | ICD-10-CM | POA: Diagnosis not present

## 2013-03-05 DIAGNOSIS — C50919 Malignant neoplasm of unspecified site of unspecified female breast: Secondary | ICD-10-CM | POA: Diagnosis not present

## 2013-03-05 DIAGNOSIS — Z789 Other specified health status: Secondary | ICD-10-CM | POA: Diagnosis not present

## 2013-03-05 DIAGNOSIS — C801 Malignant (primary) neoplasm, unspecified: Secondary | ICD-10-CM | POA: Diagnosis not present

## 2013-03-05 DIAGNOSIS — S42309D Unspecified fracture of shaft of humerus, unspecified arm, subsequent encounter for fracture with routine healing: Secondary | ICD-10-CM | POA: Diagnosis not present

## 2013-03-05 DIAGNOSIS — C229 Malignant neoplasm of liver, not specified as primary or secondary: Secondary | ICD-10-CM | POA: Diagnosis not present

## 2013-03-05 DIAGNOSIS — C787 Secondary malignant neoplasm of liver and intrahepatic bile duct: Secondary | ICD-10-CM | POA: Diagnosis not present

## 2013-03-05 DIAGNOSIS — C7800 Secondary malignant neoplasm of unspecified lung: Secondary | ICD-10-CM | POA: Diagnosis not present

## 2013-03-05 DIAGNOSIS — S42309A Unspecified fracture of shaft of humerus, unspecified arm, initial encounter for closed fracture: Secondary | ICD-10-CM | POA: Diagnosis not present

## 2013-03-06 DIAGNOSIS — S42209A Unspecified fracture of upper end of unspecified humerus, initial encounter for closed fracture: Secondary | ICD-10-CM | POA: Diagnosis not present

## 2013-03-11 DIAGNOSIS — Z9189 Other specified personal risk factors, not elsewhere classified: Secondary | ICD-10-CM | POA: Insufficient documentation

## 2013-03-13 DIAGNOSIS — Z124 Encounter for screening for malignant neoplasm of cervix: Secondary | ICD-10-CM | POA: Diagnosis not present

## 2013-03-13 DIAGNOSIS — Z01419 Encounter for gynecological examination (general) (routine) without abnormal findings: Secondary | ICD-10-CM | POA: Diagnosis not present

## 2013-03-15 DIAGNOSIS — C50919 Malignant neoplasm of unspecified site of unspecified female breast: Secondary | ICD-10-CM | POA: Diagnosis not present

## 2013-03-15 DIAGNOSIS — M810 Age-related osteoporosis without current pathological fracture: Secondary | ICD-10-CM | POA: Diagnosis not present

## 2013-03-15 DIAGNOSIS — C787 Secondary malignant neoplasm of liver and intrahepatic bile duct: Secondary | ICD-10-CM | POA: Diagnosis not present

## 2013-03-15 DIAGNOSIS — Z5111 Encounter for antineoplastic chemotherapy: Secondary | ICD-10-CM | POA: Diagnosis not present

## 2013-03-25 DIAGNOSIS — E039 Hypothyroidism, unspecified: Secondary | ICD-10-CM | POA: Diagnosis not present

## 2013-03-25 DIAGNOSIS — I10 Essential (primary) hypertension: Secondary | ICD-10-CM | POA: Diagnosis not present

## 2013-03-25 DIAGNOSIS — F329 Major depressive disorder, single episode, unspecified: Secondary | ICD-10-CM | POA: Diagnosis not present

## 2013-04-05 DIAGNOSIS — Z5111 Encounter for antineoplastic chemotherapy: Secondary | ICD-10-CM | POA: Diagnosis not present

## 2013-04-05 DIAGNOSIS — C787 Secondary malignant neoplasm of liver and intrahepatic bile duct: Secondary | ICD-10-CM | POA: Diagnosis not present

## 2013-04-05 DIAGNOSIS — M810 Age-related osteoporosis without current pathological fracture: Secondary | ICD-10-CM | POA: Diagnosis not present

## 2013-04-05 DIAGNOSIS — C50919 Malignant neoplasm of unspecified site of unspecified female breast: Secondary | ICD-10-CM | POA: Diagnosis not present

## 2013-04-15 DIAGNOSIS — IMO0002 Reserved for concepts with insufficient information to code with codable children: Secondary | ICD-10-CM | POA: Diagnosis not present

## 2013-04-26 DIAGNOSIS — C50919 Malignant neoplasm of unspecified site of unspecified female breast: Secondary | ICD-10-CM | POA: Diagnosis not present

## 2013-04-26 DIAGNOSIS — C787 Secondary malignant neoplasm of liver and intrahepatic bile duct: Secondary | ICD-10-CM | POA: Diagnosis not present

## 2013-04-26 DIAGNOSIS — Z5111 Encounter for antineoplastic chemotherapy: Secondary | ICD-10-CM | POA: Diagnosis not present

## 2013-04-26 DIAGNOSIS — M810 Age-related osteoporosis without current pathological fracture: Secondary | ICD-10-CM | POA: Diagnosis not present

## 2013-05-03 DIAGNOSIS — E2749 Other adrenocortical insufficiency: Secondary | ICD-10-CM

## 2013-05-03 DIAGNOSIS — E039 Hypothyroidism, unspecified: Secondary | ICD-10-CM

## 2013-05-03 DIAGNOSIS — R5381 Other malaise: Secondary | ICD-10-CM | POA: Diagnosis not present

## 2013-05-03 HISTORY — DX: Other adrenocortical insufficiency: E27.49

## 2013-05-03 HISTORY — DX: Hypothyroidism, unspecified: E03.9

## 2013-05-08 DIAGNOSIS — I10 Essential (primary) hypertension: Secondary | ICD-10-CM | POA: Diagnosis not present

## 2013-05-08 DIAGNOSIS — R040 Epistaxis: Secondary | ICD-10-CM | POA: Diagnosis not present

## 2013-05-09 DIAGNOSIS — Z23 Encounter for immunization: Secondary | ICD-10-CM | POA: Diagnosis not present

## 2013-05-17 DIAGNOSIS — C50919 Malignant neoplasm of unspecified site of unspecified female breast: Secondary | ICD-10-CM | POA: Diagnosis not present

## 2013-05-17 DIAGNOSIS — C787 Secondary malignant neoplasm of liver and intrahepatic bile duct: Secondary | ICD-10-CM | POA: Diagnosis not present

## 2013-05-17 DIAGNOSIS — M810 Age-related osteoporosis without current pathological fracture: Secondary | ICD-10-CM | POA: Diagnosis not present

## 2013-05-28 DIAGNOSIS — N390 Urinary tract infection, site not specified: Secondary | ICD-10-CM | POA: Diagnosis not present

## 2013-06-07 DIAGNOSIS — M810 Age-related osteoporosis without current pathological fracture: Secondary | ICD-10-CM | POA: Diagnosis not present

## 2013-06-07 DIAGNOSIS — C787 Secondary malignant neoplasm of liver and intrahepatic bile duct: Secondary | ICD-10-CM | POA: Diagnosis not present

## 2013-06-07 DIAGNOSIS — Z0389 Encounter for observation for other suspected diseases and conditions ruled out: Secondary | ICD-10-CM | POA: Diagnosis not present

## 2013-06-07 DIAGNOSIS — I369 Nonrheumatic tricuspid valve disorder, unspecified: Secondary | ICD-10-CM | POA: Diagnosis not present

## 2013-06-07 DIAGNOSIS — Z5111 Encounter for antineoplastic chemotherapy: Secondary | ICD-10-CM | POA: Diagnosis not present

## 2013-06-07 DIAGNOSIS — I495 Sick sinus syndrome: Secondary | ICD-10-CM | POA: Diagnosis not present

## 2013-06-07 DIAGNOSIS — C50919 Malignant neoplasm of unspecified site of unspecified female breast: Secondary | ICD-10-CM | POA: Diagnosis not present

## 2013-06-11 ENCOUNTER — Other Ambulatory Visit: Payer: Self-pay | Admitting: Neurosurgery

## 2013-06-11 DIAGNOSIS — M549 Dorsalgia, unspecified: Secondary | ICD-10-CM | POA: Diagnosis not present

## 2013-06-11 DIAGNOSIS — M542 Cervicalgia: Secondary | ICD-10-CM

## 2013-06-11 DIAGNOSIS — M546 Pain in thoracic spine: Secondary | ICD-10-CM

## 2013-06-21 ENCOUNTER — Ambulatory Visit
Admission: RE | Admit: 2013-06-21 | Discharge: 2013-06-21 | Disposition: A | Discharge status: Home / Self Care | Payer: Medicare Other | Source: Ambulatory Visit | Attending: Neurosurgery | Admitting: Neurosurgery

## 2013-06-21 DIAGNOSIS — M546 Pain in thoracic spine: Secondary | ICD-10-CM

## 2013-06-21 DIAGNOSIS — M549 Dorsalgia, unspecified: Secondary | ICD-10-CM | POA: Diagnosis not present

## 2013-06-21 DIAGNOSIS — R209 Unspecified disturbances of skin sensation: Secondary | ICD-10-CM | POA: Diagnosis not present

## 2013-06-21 DIAGNOSIS — M542 Cervicalgia: Secondary | ICD-10-CM

## 2013-06-25 DIAGNOSIS — I499 Cardiac arrhythmia, unspecified: Secondary | ICD-10-CM | POA: Diagnosis not present

## 2013-06-28 DIAGNOSIS — M810 Age-related osteoporosis without current pathological fracture: Secondary | ICD-10-CM | POA: Diagnosis not present

## 2013-06-28 DIAGNOSIS — C787 Secondary malignant neoplasm of liver and intrahepatic bile duct: Secondary | ICD-10-CM | POA: Diagnosis not present

## 2013-06-28 DIAGNOSIS — C50919 Malignant neoplasm of unspecified site of unspecified female breast: Secondary | ICD-10-CM | POA: Diagnosis not present

## 2013-06-28 DIAGNOSIS — Z5111 Encounter for antineoplastic chemotherapy: Secondary | ICD-10-CM | POA: Diagnosis not present

## 2013-07-08 ENCOUNTER — Other Ambulatory Visit: Payer: Self-pay | Admitting: Neurosurgery

## 2013-07-08 DIAGNOSIS — M549 Dorsalgia, unspecified: Secondary | ICD-10-CM

## 2013-07-08 DIAGNOSIS — E663 Overweight: Secondary | ICD-10-CM | POA: Diagnosis not present

## 2013-07-09 DIAGNOSIS — N390 Urinary tract infection, site not specified: Secondary | ICD-10-CM | POA: Diagnosis not present

## 2013-07-19 DIAGNOSIS — C50919 Malignant neoplasm of unspecified site of unspecified female breast: Secondary | ICD-10-CM | POA: Diagnosis not present

## 2013-07-19 DIAGNOSIS — M810 Age-related osteoporosis without current pathological fracture: Secondary | ICD-10-CM | POA: Diagnosis not present

## 2013-07-19 DIAGNOSIS — C787 Secondary malignant neoplasm of liver and intrahepatic bile duct: Secondary | ICD-10-CM | POA: Diagnosis not present

## 2013-07-19 DIAGNOSIS — Z5111 Encounter for antineoplastic chemotherapy: Secondary | ICD-10-CM | POA: Diagnosis not present

## 2013-07-22 ENCOUNTER — Ambulatory Visit
Admission: RE | Admit: 2013-07-22 | Discharge: 2013-07-22 | Disposition: A | Discharge status: Home / Self Care | Payer: Medicare Other | Source: Ambulatory Visit | Attending: Neurosurgery | Admitting: Neurosurgery

## 2013-07-22 DIAGNOSIS — M549 Dorsalgia, unspecified: Secondary | ICD-10-CM | POA: Diagnosis not present

## 2013-07-22 MED ORDER — GADOBENATE DIMEGLUMINE 529 MG/ML IV SOLN
15.0000 mL | Freq: Once | INTRAVENOUS | Status: AC | PRN
  Administered 2013-07-22: 15 mL via INTRAVENOUS

## 2013-08-06 DIAGNOSIS — Z23 Encounter for immunization: Secondary | ICD-10-CM | POA: Diagnosis not present

## 2013-08-07 DIAGNOSIS — J189 Pneumonia, unspecified organism: Secondary | ICD-10-CM | POA: Diagnosis not present

## 2013-08-07 DIAGNOSIS — M549 Dorsalgia, unspecified: Secondary | ICD-10-CM | POA: Diagnosis not present

## 2013-08-20 DIAGNOSIS — J189 Pneumonia, unspecified organism: Secondary | ICD-10-CM | POA: Diagnosis not present

## 2013-08-20 DIAGNOSIS — R1011 Right upper quadrant pain: Secondary | ICD-10-CM | POA: Diagnosis not present

## 2013-08-20 DIAGNOSIS — M549 Dorsalgia, unspecified: Secondary | ICD-10-CM | POA: Diagnosis not present

## 2013-08-23 DIAGNOSIS — C787 Secondary malignant neoplasm of liver and intrahepatic bile duct: Secondary | ICD-10-CM | POA: Diagnosis not present

## 2013-08-23 DIAGNOSIS — J189 Pneumonia, unspecified organism: Secondary | ICD-10-CM | POA: Diagnosis not present

## 2013-08-23 DIAGNOSIS — Z5111 Encounter for antineoplastic chemotherapy: Secondary | ICD-10-CM | POA: Diagnosis not present

## 2013-08-23 DIAGNOSIS — R079 Chest pain, unspecified: Secondary | ICD-10-CM | POA: Diagnosis not present

## 2013-08-23 DIAGNOSIS — C50919 Malignant neoplasm of unspecified site of unspecified female breast: Secondary | ICD-10-CM | POA: Diagnosis not present

## 2013-08-23 DIAGNOSIS — M542 Cervicalgia: Secondary | ICD-10-CM | POA: Diagnosis not present

## 2013-08-23 DIAGNOSIS — E669 Obesity, unspecified: Secondary | ICD-10-CM | POA: Diagnosis not present

## 2013-09-13 DIAGNOSIS — C787 Secondary malignant neoplasm of liver and intrahepatic bile duct: Secondary | ICD-10-CM | POA: Diagnosis not present

## 2013-09-13 DIAGNOSIS — Z5111 Encounter for antineoplastic chemotherapy: Secondary | ICD-10-CM | POA: Diagnosis not present

## 2013-09-13 DIAGNOSIS — C50919 Malignant neoplasm of unspecified site of unspecified female breast: Secondary | ICD-10-CM | POA: Diagnosis not present

## 2013-09-13 DIAGNOSIS — M810 Age-related osteoporosis without current pathological fracture: Secondary | ICD-10-CM | POA: Diagnosis not present

## 2013-10-02 DIAGNOSIS — Z09 Encounter for follow-up examination after completed treatment for conditions other than malignant neoplasm: Secondary | ICD-10-CM | POA: Diagnosis not present

## 2013-10-02 DIAGNOSIS — C50919 Malignant neoplasm of unspecified site of unspecified female breast: Secondary | ICD-10-CM | POA: Diagnosis not present

## 2013-10-02 DIAGNOSIS — M810 Age-related osteoporosis without current pathological fracture: Secondary | ICD-10-CM | POA: Diagnosis not present

## 2013-10-02 DIAGNOSIS — C779 Secondary and unspecified malignant neoplasm of lymph node, unspecified: Secondary | ICD-10-CM | POA: Diagnosis not present

## 2013-10-02 DIAGNOSIS — C787 Secondary malignant neoplasm of liver and intrahepatic bile duct: Secondary | ICD-10-CM | POA: Diagnosis not present

## 2013-10-04 DIAGNOSIS — Z5111 Encounter for antineoplastic chemotherapy: Secondary | ICD-10-CM | POA: Diagnosis not present

## 2013-10-04 DIAGNOSIS — C787 Secondary malignant neoplasm of liver and intrahepatic bile duct: Secondary | ICD-10-CM | POA: Diagnosis not present

## 2013-10-04 DIAGNOSIS — C50919 Malignant neoplasm of unspecified site of unspecified female breast: Secondary | ICD-10-CM | POA: Diagnosis not present

## 2013-10-04 DIAGNOSIS — M810 Age-related osteoporosis without current pathological fracture: Secondary | ICD-10-CM | POA: Diagnosis not present

## 2013-10-25 DIAGNOSIS — M810 Age-related osteoporosis without current pathological fracture: Secondary | ICD-10-CM | POA: Diagnosis not present

## 2013-10-25 DIAGNOSIS — C787 Secondary malignant neoplasm of liver and intrahepatic bile duct: Secondary | ICD-10-CM | POA: Diagnosis not present

## 2013-10-25 DIAGNOSIS — Z5111 Encounter for antineoplastic chemotherapy: Secondary | ICD-10-CM | POA: Diagnosis not present

## 2013-10-25 DIAGNOSIS — C50919 Malignant neoplasm of unspecified site of unspecified female breast: Secondary | ICD-10-CM | POA: Diagnosis not present

## 2013-11-07 DIAGNOSIS — F329 Major depressive disorder, single episode, unspecified: Secondary | ICD-10-CM | POA: Diagnosis not present

## 2013-11-07 DIAGNOSIS — IMO0002 Reserved for concepts with insufficient information to code with codable children: Secondary | ICD-10-CM | POA: Diagnosis not present

## 2013-11-07 DIAGNOSIS — F3289 Other specified depressive episodes: Secondary | ICD-10-CM | POA: Diagnosis not present

## 2013-11-07 DIAGNOSIS — R079 Chest pain, unspecified: Secondary | ICD-10-CM | POA: Diagnosis not present

## 2013-11-15 DIAGNOSIS — C787 Secondary malignant neoplasm of liver and intrahepatic bile duct: Secondary | ICD-10-CM | POA: Diagnosis not present

## 2013-11-15 DIAGNOSIS — M810 Age-related osteoporosis without current pathological fracture: Secondary | ICD-10-CM | POA: Diagnosis not present

## 2013-11-15 DIAGNOSIS — C50919 Malignant neoplasm of unspecified site of unspecified female breast: Secondary | ICD-10-CM | POA: Diagnosis not present

## 2013-11-15 DIAGNOSIS — Z5111 Encounter for antineoplastic chemotherapy: Secondary | ICD-10-CM | POA: Diagnosis not present

## 2013-11-19 DIAGNOSIS — R030 Elevated blood-pressure reading, without diagnosis of hypertension: Secondary | ICD-10-CM | POA: Diagnosis not present

## 2013-11-19 DIAGNOSIS — Z6829 Body mass index (BMI) 29.0-29.9, adult: Secondary | ICD-10-CM | POA: Diagnosis not present

## 2013-11-19 DIAGNOSIS — M542 Cervicalgia: Secondary | ICD-10-CM | POA: Diagnosis not present

## 2013-11-19 DIAGNOSIS — M549 Dorsalgia, unspecified: Secondary | ICD-10-CM | POA: Diagnosis not present

## 2013-12-06 DIAGNOSIS — M810 Age-related osteoporosis without current pathological fracture: Secondary | ICD-10-CM | POA: Diagnosis not present

## 2013-12-06 DIAGNOSIS — C50919 Malignant neoplasm of unspecified site of unspecified female breast: Secondary | ICD-10-CM | POA: Diagnosis not present

## 2013-12-06 DIAGNOSIS — C787 Secondary malignant neoplasm of liver and intrahepatic bile duct: Secondary | ICD-10-CM | POA: Diagnosis not present

## 2013-12-06 DIAGNOSIS — Z5111 Encounter for antineoplastic chemotherapy: Secondary | ICD-10-CM | POA: Diagnosis not present

## 2013-12-24 ENCOUNTER — Other Ambulatory Visit: Payer: Self-pay

## 2013-12-27 DIAGNOSIS — C787 Secondary malignant neoplasm of liver and intrahepatic bile duct: Secondary | ICD-10-CM | POA: Diagnosis not present

## 2013-12-27 DIAGNOSIS — Z5111 Encounter for antineoplastic chemotherapy: Secondary | ICD-10-CM | POA: Diagnosis not present

## 2013-12-27 DIAGNOSIS — M810 Age-related osteoporosis without current pathological fracture: Secondary | ICD-10-CM | POA: Diagnosis not present

## 2013-12-27 DIAGNOSIS — C50919 Malignant neoplasm of unspecified site of unspecified female breast: Secondary | ICD-10-CM | POA: Diagnosis not present

## 2014-01-17 DIAGNOSIS — M810 Age-related osteoporosis without current pathological fracture: Secondary | ICD-10-CM | POA: Diagnosis not present

## 2014-01-17 DIAGNOSIS — C787 Secondary malignant neoplasm of liver and intrahepatic bile duct: Secondary | ICD-10-CM | POA: Diagnosis not present

## 2014-01-17 DIAGNOSIS — Z5111 Encounter for antineoplastic chemotherapy: Secondary | ICD-10-CM | POA: Diagnosis not present

## 2014-01-17 DIAGNOSIS — C50919 Malignant neoplasm of unspecified site of unspecified female breast: Secondary | ICD-10-CM | POA: Diagnosis not present

## 2014-02-06 ENCOUNTER — Other Ambulatory Visit: Payer: Self-pay

## 2014-02-07 DIAGNOSIS — Z5111 Encounter for antineoplastic chemotherapy: Secondary | ICD-10-CM | POA: Diagnosis not present

## 2014-02-07 DIAGNOSIS — C50919 Malignant neoplasm of unspecified site of unspecified female breast: Secondary | ICD-10-CM | POA: Diagnosis not present

## 2014-02-07 DIAGNOSIS — C787 Secondary malignant neoplasm of liver and intrahepatic bile duct: Secondary | ICD-10-CM | POA: Diagnosis not present

## 2014-02-07 DIAGNOSIS — M810 Age-related osteoporosis without current pathological fracture: Secondary | ICD-10-CM | POA: Diagnosis not present

## 2014-02-11 ENCOUNTER — Other Ambulatory Visit: Payer: Self-pay

## 2014-02-11 DIAGNOSIS — Z1231 Encounter for screening mammogram for malignant neoplasm of breast: Secondary | ICD-10-CM

## 2014-02-11 DIAGNOSIS — M810 Age-related osteoporosis without current pathological fracture: Secondary | ICD-10-CM

## 2014-02-13 ENCOUNTER — Other Ambulatory Visit: Payer: Self-pay | Admitting: Oncology

## 2014-02-13 DIAGNOSIS — F3289 Other specified depressive episodes: Secondary | ICD-10-CM | POA: Diagnosis not present

## 2014-02-13 DIAGNOSIS — I499 Cardiac arrhythmia, unspecified: Secondary | ICD-10-CM | POA: Diagnosis not present

## 2014-02-13 DIAGNOSIS — E039 Hypothyroidism, unspecified: Secondary | ICD-10-CM | POA: Diagnosis not present

## 2014-02-13 DIAGNOSIS — F329 Major depressive disorder, single episode, unspecified: Secondary | ICD-10-CM | POA: Diagnosis not present

## 2014-02-13 DIAGNOSIS — M810 Age-related osteoporosis without current pathological fracture: Secondary | ICD-10-CM

## 2014-02-13 DIAGNOSIS — IMO0002 Reserved for concepts with insufficient information to code with codable children: Secondary | ICD-10-CM | POA: Diagnosis not present

## 2014-02-19 ENCOUNTER — Other Ambulatory Visit: Payer: Medicare Other

## 2014-02-19 ENCOUNTER — Ambulatory Visit: Payer: Medicare Other

## 2014-02-28 DIAGNOSIS — C50919 Malignant neoplasm of unspecified site of unspecified female breast: Secondary | ICD-10-CM | POA: Diagnosis not present

## 2014-02-28 DIAGNOSIS — Z5111 Encounter for antineoplastic chemotherapy: Secondary | ICD-10-CM | POA: Diagnosis not present

## 2014-02-28 DIAGNOSIS — M810 Age-related osteoporosis without current pathological fracture: Secondary | ICD-10-CM | POA: Diagnosis not present

## 2014-02-28 DIAGNOSIS — C787 Secondary malignant neoplasm of liver and intrahepatic bile duct: Secondary | ICD-10-CM | POA: Diagnosis not present

## 2014-03-05 DIAGNOSIS — R935 Abnormal findings on diagnostic imaging of other abdominal regions, including retroperitoneum: Secondary | ICD-10-CM | POA: Diagnosis not present

## 2014-03-05 DIAGNOSIS — C787 Secondary malignant neoplasm of liver and intrahepatic bile duct: Secondary | ICD-10-CM | POA: Diagnosis not present

## 2014-03-05 DIAGNOSIS — C50919 Malignant neoplasm of unspecified site of unspecified female breast: Secondary | ICD-10-CM | POA: Diagnosis not present

## 2014-03-05 DIAGNOSIS — R918 Other nonspecific abnormal finding of lung field: Secondary | ICD-10-CM | POA: Diagnosis not present

## 2014-03-06 ENCOUNTER — Encounter (INDEPENDENT_AMBULATORY_CARE_PROVIDER_SITE_OTHER): Payer: Self-pay

## 2014-03-06 ENCOUNTER — Ambulatory Visit
Admission: RE | Admit: 2014-03-06 | Discharge: 2014-03-06 | Disposition: A | Discharge status: Home / Self Care | Payer: Medicare Other | Source: Ambulatory Visit | Attending: Oncology | Admitting: Oncology

## 2014-03-06 ENCOUNTER — Ambulatory Visit
Admission: RE | Admit: 2014-03-06 | Discharge: 2014-03-06 | Disposition: A | Discharge status: Home / Self Care | Payer: Medicare Other | Source: Ambulatory Visit

## 2014-03-06 DIAGNOSIS — M5020 Other cervical disc displacement, unspecified cervical region: Secondary | ICD-10-CM | POA: Diagnosis not present

## 2014-03-06 DIAGNOSIS — M48061 Spinal stenosis, lumbar region without neurogenic claudication: Secondary | ICD-10-CM | POA: Diagnosis not present

## 2014-03-06 DIAGNOSIS — M810 Age-related osteoporosis without current pathological fracture: Secondary | ICD-10-CM

## 2014-03-06 DIAGNOSIS — Z1231 Encounter for screening mammogram for malignant neoplasm of breast: Secondary | ICD-10-CM

## 2014-03-07 ENCOUNTER — Other Ambulatory Visit: Payer: Self-pay | Admitting: Neurosurgery

## 2014-03-19 ENCOUNTER — Ambulatory Visit
Admission: RE | Admit: 2014-03-19 | Discharge: 2014-03-19 | Disposition: A | Discharge status: Home / Self Care | Payer: Medicare Other | Source: Ambulatory Visit | Attending: Oncology | Admitting: Oncology

## 2014-03-19 ENCOUNTER — Other Ambulatory Visit (HOSPITAL_COMMUNITY): Payer: Medicare Other

## 2014-03-19 DIAGNOSIS — M949 Disorder of cartilage, unspecified: Secondary | ICD-10-CM | POA: Diagnosis not present

## 2014-03-19 DIAGNOSIS — M48061 Spinal stenosis, lumbar region without neurogenic claudication: Secondary | ICD-10-CM | POA: Diagnosis not present

## 2014-03-19 DIAGNOSIS — M899 Disorder of bone, unspecified: Secondary | ICD-10-CM | POA: Diagnosis not present

## 2014-03-21 DIAGNOSIS — M810 Age-related osteoporosis without current pathological fracture: Secondary | ICD-10-CM | POA: Diagnosis not present

## 2014-03-21 DIAGNOSIS — C787 Secondary malignant neoplasm of liver and intrahepatic bile duct: Secondary | ICD-10-CM | POA: Diagnosis not present

## 2014-03-21 DIAGNOSIS — Z5111 Encounter for antineoplastic chemotherapy: Secondary | ICD-10-CM | POA: Diagnosis not present

## 2014-03-21 DIAGNOSIS — C50919 Malignant neoplasm of unspecified site of unspecified female breast: Secondary | ICD-10-CM | POA: Diagnosis not present

## 2014-03-27 ENCOUNTER — Ambulatory Visit (HOSPITAL_COMMUNITY): Admission: RE | Admit: 2014-03-27 | Payer: Medicare Other | Source: Ambulatory Visit | Admitting: Neurosurgery

## 2014-03-27 ENCOUNTER — Encounter (HOSPITAL_COMMUNITY): Admission: RE | Payer: Self-pay | Source: Ambulatory Visit

## 2014-03-27 SURGERY — ANTERIOR CERVICAL DECOMPRESSION/DISCECTOMY FUSION 1 LEVEL
Anesthesia: General

## 2014-03-28 DIAGNOSIS — I10 Essential (primary) hypertension: Secondary | ICD-10-CM | POA: Diagnosis not present

## 2014-03-28 DIAGNOSIS — R040 Epistaxis: Secondary | ICD-10-CM | POA: Diagnosis not present

## 2014-03-28 DIAGNOSIS — J342 Deviated nasal septum: Secondary | ICD-10-CM | POA: Diagnosis not present

## 2014-04-07 DIAGNOSIS — N390 Urinary tract infection, site not specified: Secondary | ICD-10-CM | POA: Diagnosis not present

## 2014-04-07 DIAGNOSIS — E039 Hypothyroidism, unspecified: Secondary | ICD-10-CM | POA: Diagnosis not present

## 2014-04-07 DIAGNOSIS — Z01818 Encounter for other preprocedural examination: Secondary | ICD-10-CM | POA: Diagnosis not present

## 2014-04-07 DIAGNOSIS — I10 Essential (primary) hypertension: Secondary | ICD-10-CM | POA: Diagnosis not present

## 2014-04-07 DIAGNOSIS — IMO0002 Reserved for concepts with insufficient information to code with codable children: Secondary | ICD-10-CM | POA: Diagnosis not present

## 2014-04-11 DIAGNOSIS — I369 Nonrheumatic tricuspid valve disorder, unspecified: Secondary | ICD-10-CM | POA: Diagnosis not present

## 2014-04-11 DIAGNOSIS — I359 Nonrheumatic aortic valve disorder, unspecified: Secondary | ICD-10-CM | POA: Diagnosis not present

## 2014-04-11 DIAGNOSIS — C787 Secondary malignant neoplasm of liver and intrahepatic bile duct: Secondary | ICD-10-CM | POA: Diagnosis not present

## 2014-04-11 DIAGNOSIS — Z5111 Encounter for antineoplastic chemotherapy: Secondary | ICD-10-CM | POA: Diagnosis not present

## 2014-04-11 DIAGNOSIS — C50919 Malignant neoplasm of unspecified site of unspecified female breast: Secondary | ICD-10-CM | POA: Diagnosis not present

## 2014-04-11 DIAGNOSIS — N390 Urinary tract infection, site not specified: Secondary | ICD-10-CM | POA: Diagnosis not present

## 2014-04-21 ENCOUNTER — Other Ambulatory Visit (HOSPITAL_COMMUNITY): Payer: Self-pay | Admitting: Specialist

## 2014-04-22 ENCOUNTER — Encounter (HOSPITAL_COMMUNITY): Payer: Self-pay | Admitting: Pharmacy Technician

## 2014-04-22 NOTE — Pre-Procedure Instructions (Signed)
ALAZIA CROCKET  04/22/2014   Your procedure is scheduled on:  Tuesday, September 15th  Report to [facility] Admitting at 530 AM.  Call this number if you have problems the morning of surgery: [phone]   Remember:   Do not eat food or drink liquids after midnight.   Take these medicines the morning of surgery with A SIP OF WATER: neurontin, synthroid, hydrocodone if needed, atenolol if needed   Do not wear jewelry, make-up or nail polish.  Do not wear lotions, powders, or perfumes. You may wear deodorant.  Do not shave 48 hours prior to surgery. Men may shave face and neck.  Do not bring valuables to the hospital.  [facility] is not responsible  for any belongings or valuables.               Contacts, dentures or bridgework may not be worn into surgery.  Leave suitcase in the car. After surgery it may be brought to your room.  For patients admitted to the hospital, discharge time is determined by your  treatment team.               Patients discharged the day of surgery will not be allowed to drive home.  Please read over the following fact sheets that you were given: Pain Booklet, Coughing and Deep Breathing, MRSA Information and Surgical Site Infection Prevention [facility] - Preparing for Surgery  Before surgery, you can play an important role.  Because skin is not sterile, your skin needs to be as free of germs as possible.  You can reduce the number of germs on you skin by washing with CHG (chlorahexidine gluconate) soap before surgery.  CHG is an antiseptic cleaner which kills germs and bonds with the skin to continue killing germs even after washing.  Please DO NOT use if you have an allergy to CHG or antibacterial soaps.  If your skin becomes reddened/irritated stop using the CHG and inform your nurse when you arrive at Short Stay.  Do not shave (including legs and underarms) for at least 48 hours prior to the first CHG shower.  You may shave your  face.  Please follow these instructions carefully:   1.  Shower with CHG Soap the night before surgery and the morning of Surgery.  2.  If you choose to wash your hair, wash your hair first as usual with your normal shampoo.  3.  After you shampoo, rinse your hair and body thoroughly to remove the shampoo.  4.  Use CHG as you would any other liquid soap.  You can apply CHG directly to the skin and wash gently with scrungie or a clean washcloth.  5.  Apply the CHG Soap to your body ONLY FROM THE NECK DOWN.  Do not use on open wounds or open sores.  Avoid contact with your eyes, ears, mouth and genitals (private parts).  Wash genitals (private parts) with your normal soap.  6.  Wash thoroughly, paying special attention to the area where your surgery will be performed.  7.  Thoroughly rinse your body with warm water from the neck down.  8.  DO NOT shower/wash with your normal soap after using and rinsing off the CHG Soap.  9.  Pat yourself dry with a clean towel.            10.  Wear clean pajamas.            11.  Place clean sheets  on your bed the night of your first shower and do not sleep with pets.  Day of Surgery  Do not apply any lotions/deoderants the morning of surgery.  Please wear clean clothes to the hospital/surgery center.

## 2014-04-23 ENCOUNTER — Encounter (HOSPITAL_COMMUNITY)
Admission: RE | Admit: 2014-04-23 | Discharge: 2014-04-23 | Disposition: A | Discharge status: Home / Self Care | Payer: Medicare Other | Source: Ambulatory Visit | Attending: Specialist | Admitting: Specialist

## 2014-04-23 ENCOUNTER — Ambulatory Visit (HOSPITAL_COMMUNITY)
Admission: RE | Admit: 2014-04-23 | Discharge: 2014-04-23 | Disposition: A | Discharge status: Home / Self Care | Payer: Medicare Other | Source: Ambulatory Visit | Attending: Anesthesiology | Admitting: Anesthesiology

## 2014-04-23 ENCOUNTER — Encounter (HOSPITAL_COMMUNITY): Payer: Self-pay

## 2014-04-23 DIAGNOSIS — Z01818 Encounter for other preprocedural examination: Secondary | ICD-10-CM | POA: Diagnosis not present

## 2014-04-23 HISTORY — DX: Pneumonia, unspecified organism: J18.9

## 2014-04-23 HISTORY — DX: Inflammatory liver disease, unspecified: K75.9

## 2014-04-23 HISTORY — DX: Hypothyroidism, unspecified: E03.9

## 2014-04-23 HISTORY — DX: Disease of blood and blood-forming organs, unspecified: D75.9

## 2014-04-23 HISTORY — DX: Unspecified osteoarthritis, unspecified site: M19.90

## 2014-04-23 LAB — COMPREHENSIVE METABOLIC PANEL
ALT: 16 U/L (ref 0–35)
AST: 25 U/L (ref 0–37)
Albumin: 3.9 g/dL (ref 3.5–5.2)
Alkaline Phosphatase: 68 U/L (ref 39–117)
Anion gap: 11 (ref 5–15)
BUN: 11 mg/dL (ref 6–23)
CO2: 28 mEq/L (ref 19–32)
Calcium: 9 mg/dL (ref 8.4–10.5)
Chloride: 101 mEq/L (ref 96–112)
Creatinine, Ser: 0.78 mg/dL (ref 0.50–1.10)
GFR calc Af Amer: >90 mL/min (ref >90)
GFR calc non Af Amer: 83 mL/min — ABNORMAL LOW (ref >90)
Glucose, Bld: 96 mg/dL (ref 70–99)
Potassium: 4.4 mEq/L (ref 3.7–5.3)
Sodium: 140 mEq/L (ref 137–147)
Total Bilirubin: 0.3 mg/dL (ref 0.3–1.2)
Total Protein: 7.1 g/dL (ref 6.0–8.3)

## 2014-04-23 LAB — URINALYSIS, ROUTINE W REFLEX MICROSCOPIC
Bilirubin Urine: NEGATIVE
Glucose, UA: NEGATIVE mg/dL
Hgb urine dipstick: NEGATIVE
Ketones, ur: NEGATIVE mg/dL
Leukocytes, UA: NEGATIVE
Nitrite: NEGATIVE
Protein, ur: NEGATIVE mg/dL
Specific Gravity, Urine: 1.012 (ref 1.005–1.030)
Urobilinogen, UA: 0.2 mg/dL (ref 0.0–1.0)
pH: 5.5 (ref 5.0–8.0)

## 2014-04-23 LAB — CBC
HCT: 39.4 % (ref 36.0–46.0)
Hemoglobin: 13.2 g/dL (ref 12.0–15.0)
MCH: 30.4 pg (ref 26.0–34.0)
MCHC: 33.5 g/dL (ref 30.0–36.0)
MCV: 90.8 fL (ref 78.0–100.0)
Platelets: 184 10*3/uL (ref 150–400)
RBC: 4.34 MIL/uL (ref 3.87–5.11)
RDW: 12.6 % (ref 11.5–15.5)
WBC: 7.7 10*3/uL (ref 4.0–10.5)

## 2014-04-23 LAB — SURGICAL PCR SCREEN
MRSA, PCR: POSITIVE — AB
Staphylococcus aureus: POSITIVE — AB

## 2014-04-23 NOTE — Progress Notes (Signed)
Dr Otho Ket office made aware that patient's nasal swab was positive for MRSA and Staph. Patient notified and that a script was called to [phone]. Patient verbalized instructions

## 2014-04-23 NOTE — Progress Notes (Signed)
Dr Otho Ket office made aware that orders need to be signed in Epic for patient's PAT appointment.

## 2014-04-29 DIAGNOSIS — N390 Urinary tract infection, site not specified: Secondary | ICD-10-CM | POA: Diagnosis not present

## 2014-05-05 MED ORDER — CEFAZOLIN SODIUM-DEXTROSE 2-3 GM-% IV SOLR
2.0000 g | INTRAVENOUS | Status: AC
  Administered 2014-05-06: 2 g via INTRAVENOUS
  Filled 2014-05-05: qty 50

## 2014-05-06 ENCOUNTER — Inpatient Hospital Stay (HOSPITAL_COMMUNITY): Payer: Medicare Other | Admitting: Anesthesiology

## 2014-05-06 ENCOUNTER — Encounter (HOSPITAL_COMMUNITY): Payer: Medicare Other | Admitting: Anesthesiology

## 2014-05-06 ENCOUNTER — Encounter (HOSPITAL_COMMUNITY): Payer: Self-pay | Admitting: *Deleted

## 2014-05-06 ENCOUNTER — Inpatient Hospital Stay (HOSPITAL_COMMUNITY): Payer: Medicare Other

## 2014-05-06 ENCOUNTER — Encounter (HOSPITAL_COMMUNITY)
Admission: RE | Disposition: A | Discharge status: Home / Self Care | Payer: Medicare Other | Source: Ambulatory Visit | Attending: Specialist

## 2014-05-06 ENCOUNTER — Inpatient Hospital Stay (HOSPITAL_COMMUNITY)
Admission: RE | Admit: 2014-05-06 | Discharge: 2014-05-07 | DRG: 473 | Disposition: A | Discharge status: Home / Self Care | Payer: Medicare Other | Source: Ambulatory Visit | Attending: Specialist | Admitting: Specialist

## 2014-05-06 DIAGNOSIS — M4802 Spinal stenosis, cervical region: Secondary | ICD-10-CM

## 2014-05-06 DIAGNOSIS — R21 Rash and other nonspecific skin eruption: Secondary | ICD-10-CM | POA: Diagnosis present

## 2014-05-06 DIAGNOSIS — Z79899 Other long term (current) drug therapy: Secondary | ICD-10-CM

## 2014-05-06 DIAGNOSIS — M5020 Other cervical disc displacement, unspecified cervical region: Secondary | ICD-10-CM

## 2014-05-06 DIAGNOSIS — M4180 Other forms of scoliosis, site unspecified: Secondary | ICD-10-CM | POA: Diagnosis present

## 2014-05-06 DIAGNOSIS — M1990 Unspecified osteoarthritis, unspecified site: Secondary | ICD-10-CM | POA: Diagnosis not present

## 2014-05-06 DIAGNOSIS — IMO0002 Reserved for concepts with insufficient information to code with codable children: Secondary | ICD-10-CM | POA: Diagnosis not present

## 2014-05-06 DIAGNOSIS — Z853 Personal history of malignant neoplasm of breast: Secondary | ICD-10-CM

## 2014-05-06 DIAGNOSIS — Z87891 Personal history of nicotine dependence: Secondary | ICD-10-CM | POA: Diagnosis not present

## 2014-05-06 DIAGNOSIS — M5380 Other specified dorsopathies, site unspecified: Secondary | ICD-10-CM | POA: Diagnosis present

## 2014-05-06 DIAGNOSIS — M48061 Spinal stenosis, lumbar region without neurogenic claudication: Secondary | ICD-10-CM | POA: Diagnosis not present

## 2014-05-06 DIAGNOSIS — E039 Hypothyroidism, unspecified: Secondary | ICD-10-CM | POA: Diagnosis present

## 2014-05-06 DIAGNOSIS — R269 Unspecified abnormalities of gait and mobility: Secondary | ICD-10-CM | POA: Diagnosis not present

## 2014-05-06 DIAGNOSIS — I129 Hypertensive chronic kidney disease with stage 1 through stage 4 chronic kidney disease, or unspecified chronic kidney disease: Secondary | ICD-10-CM | POA: Diagnosis present

## 2014-05-06 DIAGNOSIS — N882 Stricture and stenosis of cervix uteri: Secondary | ICD-10-CM | POA: Diagnosis present

## 2014-05-06 DIAGNOSIS — M539 Dorsopathy, unspecified: Secondary | ICD-10-CM | POA: Diagnosis not present

## 2014-05-06 DIAGNOSIS — N189 Chronic kidney disease, unspecified: Secondary | ICD-10-CM | POA: Diagnosis present

## 2014-05-06 DIAGNOSIS — C50919 Malignant neoplasm of unspecified site of unspecified female breast: Secondary | ICD-10-CM | POA: Diagnosis not present

## 2014-05-06 DIAGNOSIS — M47812 Spondylosis without myelopathy or radiculopathy, cervical region: Secondary | ICD-10-CM

## 2014-05-06 HISTORY — PX: ANTERIOR CERVICAL DECOMP/DISCECTOMY FUSION: SHX1161

## 2014-05-06 HISTORY — DX: Unspecified disorder of nose and nasal sinuses: J34.9

## 2014-05-06 HISTORY — DX: Spondylosis without myelopathy or radiculopathy, cervical region: M47.812

## 2014-05-06 HISTORY — DX: Spinal stenosis, cervical region: M48.02

## 2014-05-06 HISTORY — DX: Other cervical disc displacement, unspecified cervical region: M50.20

## 2014-05-06 SURGERY — ANTERIOR CERVICAL DECOMPRESSION/DISCECTOMY FUSION 1 LEVEL
Anesthesia: General | Site: Spine Cervical

## 2014-05-06 MED ORDER — GELATIN ABSORBABLE 100 EX MISC
CUTANEOUS | Status: DC | PRN
  Administered 2014-05-06: 09:00:00 via TOPICAL

## 2014-05-06 MED ORDER — SODIUM CHLORIDE 0.9 % IJ SOLN
3.0000 mL | INTRAMUSCULAR | Status: DC | PRN
  Administered 2014-05-07: 3 mL via INTRAVENOUS

## 2014-05-06 MED ORDER — HYDROCODONE-ACETAMINOPHEN 5-325 MG PO TABS
1.0000 | ORAL_TABLET | ORAL | Status: DC | PRN
  Administered 2014-05-06 – 2014-05-07 (×2): 2 via ORAL
  Filled 2014-05-06 (×2): qty 2

## 2014-05-06 MED ORDER — METHOCARBAMOL 1000 MG/10ML IJ SOLN: 500.0000 mg | Freq: Four times a day (QID) | INTRAVENOUS | Status: DC | PRN

## 2014-05-06 MED ORDER — SCOPOLAMINE 1 MG/3DAYS TD PT72
1.0000 | MEDICATED_PATCH | TRANSDERMAL | Status: DC
  Administered 2014-05-06: 1.5 mg via TRANSDERMAL
  Filled 2014-05-06: qty 1

## 2014-05-06 MED ORDER — CELECOXIB 200 MG PO CAPS
200.0000 mg | ORAL_CAPSULE | Freq: Every day | ORAL | Status: DC
  Administered 2014-05-06 – 2014-05-07 (×2): 200 mg via ORAL
  Filled 2014-05-06 (×2): qty 1

## 2014-05-06 MED ORDER — ONDANSETRON HCL 4 MG/2ML IJ SOLN
INTRAMUSCULAR | Status: AC
  Administered 2014-05-06: 12:00:00
  Filled 2014-05-06: qty 2

## 2014-05-06 MED ORDER — DEXMEDETOMIDINE HCL IN NACL 200 MCG/50ML IV SOLN
INTRAVENOUS | Status: AC
  Filled 2014-05-06: qty 50

## 2014-05-06 MED ORDER — GLYCOPYRROLATE 0.2 MG/ML IJ SOLN
INTRAMUSCULAR | Status: AC
  Filled 2014-05-06: qty 1

## 2014-05-06 MED ORDER — 0.9 % SODIUM CHLORIDE (POUR BTL) OPTIME
TOPICAL | Status: DC | PRN
  Administered 2014-05-06: 1000 mL

## 2014-05-06 MED ORDER — LIDOCAINE HCL (CARDIAC) 20 MG/ML IV SOLN
INTRAVENOUS | Status: DC | PRN
  Administered 2014-05-06: 100 mg via INTRAVENOUS

## 2014-05-06 MED ORDER — SODIUM CHLORIDE 0.9 % IJ SOLN: 3.0000 mL | Freq: Two times a day (BID) | INTRAMUSCULAR | Status: DC

## 2014-05-06 MED ORDER — BISACODYL 10 MG RE SUPP: 10.0000 mg | Freq: Every day | RECTAL | Status: DC | PRN

## 2014-05-06 MED ORDER — LEVOTHYROXINE SODIUM 50 MCG PO TABS
50.0000 ug | ORAL_TABLET | Freq: Every day | ORAL | Status: DC
  Administered 2014-05-07: 50 ug via ORAL
  Filled 2014-05-06 (×2): qty 1

## 2014-05-06 MED ORDER — METHOCARBAMOL 500 MG PO TABS
ORAL_TABLET | ORAL | Status: AC
  Administered 2014-05-06: 13:00:00
  Filled 2014-05-06: qty 1

## 2014-05-06 MED ORDER — ACETAMINOPHEN 650 MG RE SUPP: 650.0000 mg | RECTAL | Status: DC | PRN

## 2014-05-06 MED ORDER — NEOSTIGMINE METHYLSULFATE 10 MG/10ML IV SOLN
INTRAVENOUS | Status: DC | PRN
  Administered 2014-05-06: 3 mg via INTRAVENOUS

## 2014-05-06 MED ORDER — DEXAMETHASONE SODIUM PHOSPHATE 4 MG/ML IJ SOLN
INTRAMUSCULAR | Status: DC | PRN
  Administered 2014-05-06: 8 mg via INTRAVENOUS

## 2014-05-06 MED ORDER — ZOLPIDEM TARTRATE 5 MG PO TABS: 5.0000 mg | ORAL_TABLET | Freq: Every evening | ORAL | Status: DC | PRN

## 2014-05-06 MED ORDER — CEFAZOLIN SODIUM 1-5 GM-% IV SOLN
1.0000 g | Freq: Three times a day (TID) | INTRAVENOUS | Status: AC
  Administered 2014-05-06 (×2): 1 g via INTRAVENOUS
  Filled 2014-05-06 (×3): qty 50

## 2014-05-06 MED ORDER — GABAPENTIN 600 MG PO TABS: 600.0000 mg | ORAL_TABLET | Freq: Three times a day (TID) | ORAL | Status: DC

## 2014-05-06 MED ORDER — MIDAZOLAM HCL 2 MG/2ML IJ SOLN
INTRAMUSCULAR | Status: AC
  Filled 2014-05-06: qty 2

## 2014-05-06 MED ORDER — FENTANYL CITRATE 0.05 MG/ML IJ SOLN
INTRAMUSCULAR | Status: AC
  Filled 2014-05-06: qty 5

## 2014-05-06 MED ORDER — OXYCODONE-ACETAMINOPHEN 5-325 MG PO TABS
1.0000 | ORAL_TABLET | ORAL | Status: DC | PRN
  Administered 2014-05-06 (×2): 2 via ORAL
  Administered 2014-05-07: 1 via ORAL
  Administered 2014-05-07: 2 via ORAL
  Administered 2014-05-07: 1 via ORAL
  Filled 2014-05-06: qty 1
  Filled 2014-05-06 (×2): qty 2
  Filled 2014-05-06: qty 1

## 2014-05-06 MED ORDER — EPHEDRINE SULFATE 50 MG/ML IJ SOLN
INTRAMUSCULAR | Status: AC
Start: 2014-05-06 — End: 2014-05-06
  Filled 2014-05-06: qty 1

## 2014-05-06 MED ORDER — OXYCODONE-ACETAMINOPHEN 5-325 MG PO TABS: 1.0000 | ORAL_TABLET | ORAL | Status: DC | PRN

## 2014-05-06 MED ORDER — FENTANYL CITRATE 0.05 MG/ML IJ SOLN
INTRAMUSCULAR | Status: DC | PRN
  Administered 2014-05-06 (×4): 50 ug via INTRAVENOUS
  Administered 2014-05-06: 100 ug via INTRAVENOUS

## 2014-05-06 MED ORDER — ONDANSETRON HCL 4 MG/2ML IJ SOLN
4.0000 mg | INTRAMUSCULAR | Status: DC | PRN
  Administered 2014-05-06: 4 mg via INTRAVENOUS

## 2014-05-06 MED ORDER — PHENOL 1.4 % MT LIQD: 1.0000 | OROMUCOSAL | Status: DC | PRN

## 2014-05-06 MED ORDER — PROPOFOL 10 MG/ML IV BOLUS
INTRAVENOUS | Status: DC | PRN
  Administered 2014-05-06: 110 mg via INTRAVENOUS

## 2014-05-06 MED ORDER — CEFAZOLIN SODIUM-DEXTROSE 2-3 GM-% IV SOLR: 2.0000 g | INTRAVENOUS | Status: DC

## 2014-05-06 MED ORDER — METHOCARBAMOL 500 MG PO TABS
500.0000 mg | ORAL_TABLET | Freq: Four times a day (QID) | ORAL | Status: DC
  Administered 2014-05-06 – 2014-05-07 (×2): 500 mg via ORAL
  Filled 2014-05-06 (×5): qty 1

## 2014-05-06 MED ORDER — SODIUM CHLORIDE 0.9 % IV SOLN: 250.0000 mL | INTRAVENOUS | Status: DC

## 2014-05-06 MED ORDER — PROMETHAZINE HCL 25 MG PO TABS: 25.0000 mg | ORAL_TABLET | Freq: Four times a day (QID) | ORAL | Status: DC | PRN

## 2014-05-06 MED ORDER — KCL IN DEXTROSE-NACL 20-5-0.45 MEQ/L-%-% IV SOLN
INTRAVENOUS | Status: DC
  Filled 2014-05-06 (×3): qty 1000

## 2014-05-06 MED ORDER — DOCUSATE SODIUM 100 MG PO CAPS: 100.0000 mg | ORAL_CAPSULE | Freq: Two times a day (BID) | ORAL | Status: DC | PRN

## 2014-05-06 MED ORDER — GABAPENTIN 600 MG PO TABS
600.0000 mg | ORAL_TABLET | Freq: Three times a day (TID) | ORAL | Status: DC
  Administered 2014-05-06 – 2014-05-07 (×4): 600 mg via ORAL
  Filled 2014-05-06 (×7): qty 1

## 2014-05-06 MED ORDER — FENTANYL CITRATE 0.05 MG/ML IJ SOLN
25.0000 ug | INTRAMUSCULAR | Status: DC | PRN
  Administered 2014-05-06 (×3): 50 ug via INTRAVENOUS

## 2014-05-06 MED ORDER — MEPERIDINE HCL 25 MG/ML IJ SOLN: 6.2500 mg | INTRAMUSCULAR | Status: DC | PRN

## 2014-05-06 MED ORDER — VANCOMYCIN HCL IN DEXTROSE 1-5 GM/200ML-% IV SOLN
1000.0000 mg | Freq: Once | INTRAVENOUS | Status: AC
  Administered 2014-05-06: 1000 mg via INTRAVENOUS
  Filled 2014-05-06 (×2): qty 200

## 2014-05-06 MED ORDER — PROMETHAZINE HCL 25 MG/ML IJ SOLN: 6.2500 mg | INTRAMUSCULAR | Status: DC | PRN

## 2014-05-06 MED ORDER — THROMBIN 20000 UNITS EX SOLR
CUTANEOUS | Status: AC
  Filled 2014-05-06: qty 20000

## 2014-05-06 MED ORDER — KCL IN DEXTROSE-NACL 20-5-0.45 MEQ/L-%-% IV SOLN
INTRAVENOUS | Status: AC
Start: 2014-05-06 — End: 2014-05-07
  Filled 2014-05-06: qty 1000

## 2014-05-06 MED ORDER — ADULT MULTIVITAMIN W/MINERALS CH
1.0000 | ORAL_TABLET | Freq: Every day | ORAL | Status: DC
  Filled 2014-05-06: qty 1

## 2014-05-06 MED ORDER — HYDROCODONE-ACETAMINOPHEN 7.5-325 MG PO TABS: 1.0000 | ORAL_TABLET | Freq: Four times a day (QID) | ORAL | Status: DC | PRN

## 2014-05-06 MED ORDER — PANTOPRAZOLE SODIUM 40 MG IV SOLR
40.0000 mg | Freq: Every day | INTRAVENOUS | Status: DC
  Administered 2014-05-06: 40 mg via INTRAVENOUS
  Filled 2014-05-06 (×2): qty 40

## 2014-05-06 MED ORDER — FENTANYL CITRATE 0.05 MG/ML IJ SOLN
INTRAMUSCULAR | Status: AC
  Administered 2014-05-06: 13:00:00
  Filled 2014-05-06: qty 2

## 2014-05-06 MED ORDER — BUPIVACAINE HCL 0.5 % IJ SOLN
INTRAMUSCULAR | Status: DC | PRN
  Administered 2014-05-06: 6 mL

## 2014-05-06 MED ORDER — MORPHINE SULFATE 2 MG/ML IJ SOLN
1.0000 mg | INTRAMUSCULAR | Status: DC | PRN
  Administered 2014-05-06: 2 mg via INTRAVENOUS
  Filled 2014-05-06: qty 1

## 2014-05-06 MED ORDER — PHENYLEPHRINE 40 MCG/ML (10ML) SYRINGE FOR IV PUSH (FOR BLOOD PRESSURE SUPPORT)
PREFILLED_SYRINGE | INTRAVENOUS | Status: AC
  Filled 2014-05-06: qty 10

## 2014-05-06 MED ORDER — SENNOSIDES-DOCUSATE SODIUM 8.6-50 MG PO TABS
1.0000 | ORAL_TABLET | Freq: Every evening | ORAL | Status: DC | PRN
Start: 2014-05-06 — End: 2014-05-07

## 2014-05-06 MED ORDER — FLEET ENEMA 7-19 GM/118ML RE ENEM: 1.0000 | ENEMA | Freq: Once | RECTAL | Status: AC | PRN

## 2014-05-06 MED ORDER — ONDANSETRON HCL 4 MG/2ML IJ SOLN
INTRAMUSCULAR | Status: AC
  Filled 2014-05-06: qty 2

## 2014-05-06 MED ORDER — ACETAMINOPHEN 325 MG PO TABS: 650.0000 mg | ORAL_TABLET | ORAL | Status: DC | PRN

## 2014-05-06 MED ORDER — LACTATED RINGERS IV SOLN
INTRAVENOUS | Status: DC | PRN
  Administered 2014-05-06 (×2): via INTRAVENOUS

## 2014-05-06 MED ORDER — CHLORHEXIDINE GLUCONATE 4 % EX LIQD
60.0000 mL | Freq: Once | CUTANEOUS | Status: DC
  Filled 2014-05-06: qty 60

## 2014-05-06 MED ORDER — LIDOCAINE HCL (CARDIAC) 20 MG/ML IV SOLN
INTRAVENOUS | Status: AC
  Filled 2014-05-06: qty 5

## 2014-05-06 MED ORDER — SODIUM CHLORIDE 0.9 % IJ SOLN
INTRAMUSCULAR | Status: AC
  Filled 2014-05-06: qty 10

## 2014-05-06 MED ORDER — DIAZEPAM 5 MG PO TABS: 10.0000 mg | ORAL_TABLET | Freq: Every evening | ORAL | Status: DC | PRN

## 2014-05-06 MED ORDER — EPHEDRINE SULFATE 50 MG/ML IJ SOLN
INTRAMUSCULAR | Status: DC | PRN
  Administered 2014-05-06: 5 mg via INTRAVENOUS
  Administered 2014-05-06: 10 mg via INTRAVENOUS
  Administered 2014-05-06 (×2): 5 mg via INTRAVENOUS

## 2014-05-06 MED ORDER — ATENOLOL 12.5 MG HALF TABLET
12.5000 mg | ORAL_TABLET | Freq: Every day | ORAL | Status: DC | PRN
  Filled 2014-05-06: qty 1

## 2014-05-06 MED ORDER — MENTHOL 3 MG MT LOZG
1.0000 | LOZENGE | OROMUCOSAL | Status: DC | PRN
  Administered 2014-05-06: 3 mg via ORAL
  Filled 2014-05-06: qty 9

## 2014-05-06 MED ORDER — ONDANSETRON HCL 4 MG/2ML IJ SOLN
INTRAMUSCULAR | Status: DC | PRN
  Administered 2014-05-06: 4 mg via INTRAVENOUS

## 2014-05-06 MED ORDER — ROCURONIUM BROMIDE 100 MG/10ML IV SOLN
INTRAVENOUS | Status: DC | PRN
  Administered 2014-05-06: 10 mg via INTRAVENOUS
  Administered 2014-05-06: 40 mg via INTRAVENOUS

## 2014-05-06 MED ORDER — ROCURONIUM BROMIDE 50 MG/5ML IV SOLN
INTRAVENOUS | Status: AC
  Filled 2014-05-06: qty 1

## 2014-05-06 MED ORDER — SUCCINYLCHOLINE CHLORIDE 20 MG/ML IJ SOLN
INTRAMUSCULAR | Status: AC
  Filled 2014-05-06: qty 1

## 2014-05-06 MED ORDER — FENTANYL CITRATE 0.05 MG/ML IJ SOLN
INTRAMUSCULAR | Status: AC
  Administered 2014-05-06: 12:00:00
  Filled 2014-05-06: qty 2

## 2014-05-06 MED ORDER — OXYCODONE-ACETAMINOPHEN 5-325 MG PO TABS
ORAL_TABLET | ORAL | Status: AC
  Administered 2014-05-06: 13:00:00
  Filled 2014-05-06: qty 2

## 2014-05-06 MED ORDER — METHOCARBAMOL 500 MG PO TABS
500.0000 mg | ORAL_TABLET | Freq: Four times a day (QID) | ORAL | Status: DC | PRN
  Administered 2014-05-06: 500 mg via ORAL

## 2014-05-06 MED ORDER — PROPOFOL 10 MG/ML IV BOLUS
INTRAVENOUS | Status: AC
  Filled 2014-05-06: qty 20

## 2014-05-06 MED ORDER — ACETAMINOPHEN 10 MG/ML IV SOLN
1000.0000 mg | INTRAVENOUS | Status: AC
  Administered 2014-05-06: 1000 mg via INTRAVENOUS
  Filled 2014-05-06: qty 100

## 2014-05-06 MED ORDER — GLYCOPYRROLATE 0.2 MG/ML IJ SOLN
INTRAMUSCULAR | Status: DC | PRN
  Administered 2014-05-06: 0.4 mg via INTRAVENOUS

## 2014-05-06 MED ORDER — DEXMEDETOMIDINE HCL 200 MCG/2ML IV SOLN
INTRAVENOUS | Status: DC | PRN
  Administered 2014-05-06: 4 ug via INTRAVENOUS

## 2014-05-06 MED ORDER — GABAPENTIN 600 MG PO TABS
1200.0000 mg | ORAL_TABLET | Freq: Every day | ORAL | Status: DC
  Administered 2014-05-06: 1200 mg via ORAL
  Filled 2014-05-06 (×3): qty 2

## 2014-05-06 MED ORDER — MIDAZOLAM HCL 5 MG/5ML IJ SOLN
INTRAMUSCULAR | Status: DC | PRN
  Administered 2014-05-06: 1 mg via INTRAVENOUS

## 2014-05-06 MED ORDER — BUPIVACAINE-EPINEPHRINE (PF) 0.5% -1:200000 IJ SOLN
INTRAMUSCULAR | Status: AC
  Filled 2014-05-06: qty 30

## 2014-05-06 MED ORDER — DEXTROSE 5 % IV SOLN
INTRAVENOUS | Status: DC | PRN
  Administered 2014-05-06: 07:00:00 via INTRAVENOUS

## 2014-05-06 SURGICAL SUPPLY — 59 items
BIT DRILL SRG 14X2.2XFLT CHK (BIT) ×1 IMPLANT
BIT DRL SRG 14X2.2XFLT CHK (BIT) ×1
BLADE SURG ROTATE 9660 (MISCELLANEOUS) IMPLANT
BUR ROUND FLUTED 4 SOFT TCH (BURR) IMPLANT
BUR SABER RD CUTTING 3.0 (BURR) ×2 IMPLANT
CANISTER SUCTION 2500CC (MISCELLANEOUS) ×2 IMPLANT
COLLAR CERV LO CONTOUR FIRM DE (SOFTGOODS) IMPLANT
CORDS BIPOLAR (ELECTRODE) ×2 IMPLANT
COVER SURGICAL LIGHT HANDLE (MISCELLANEOUS) ×2 IMPLANT
DERMABOND ADVANCED (GAUZE/BANDAGES/DRESSINGS) ×1
DERMABOND ADVANCED .7 DNX12 (GAUZE/BANDAGES/DRESSINGS) ×1 IMPLANT
DRAIN TLS ROUND 10FR (DRAIN) IMPLANT
DRAPE C-ARM 42X72 X-RAY (DRAPES) IMPLANT
DRAPE MICROSCOPE LEICA (MISCELLANEOUS) ×2 IMPLANT
DRAPE POUCH INSTRU U-SHP 10X18 (DRAPES) ×2 IMPLANT
DRAPE SURG 17X23 STRL (DRAPES) ×6 IMPLANT
DRILL BIT SKYLINE 14MM (BIT) ×1
DRSG MEPILEX BORDER 4X4 (GAUZE/BANDAGES/DRESSINGS) ×2 IMPLANT
DURAPREP 6ML APPLICATOR 50/CS (WOUND CARE) ×2 IMPLANT
ELECT BLADE 4.0 EZ CLEAN MEGAD (MISCELLANEOUS)
ELECT COATED BLADE 2.86 ST (ELECTRODE) ×2 IMPLANT
ELECT REM PT RETURN 9FT ADLT (ELECTROSURGICAL) ×2
ELECTRODE BLDE 4.0 EZ CLN MEGD (MISCELLANEOUS) IMPLANT
ELECTRODE REM PT RTRN 9FT ADLT (ELECTROSURGICAL) ×1 IMPLANT
GLOVE BIOGEL PI IND STRL 7.5 (GLOVE) ×1 IMPLANT
GLOVE BIOGEL PI INDICATOR 7.5 (GLOVE) ×1
GLOVE ECLIPSE 7.0 STRL STRAW (GLOVE) ×2 IMPLANT
GLOVE ECLIPSE 9.0 STRL (GLOVE) ×2 IMPLANT
GLOVE SURG 8.5 LATEX PF (GLOVE) ×2 IMPLANT
GOWN STRL REUS W/ TWL LRG LVL3 (GOWN DISPOSABLE) ×2 IMPLANT
GOWN STRL REUS W/TWL 2XL LVL3 (GOWN DISPOSABLE) ×2 IMPLANT
GOWN STRL REUS W/TWL LRG LVL3 (GOWN DISPOSABLE) ×2
HEAD HALTER (SOFTGOODS) ×2 IMPLANT
KIT BASIN OR (CUSTOM PROCEDURE TRAY) ×2 IMPLANT
KIT ROOM TURNOVER OR (KITS) ×2 IMPLANT
NEEDLE SPNL 20GX3.5 QUINCKE YW (NEEDLE) ×4 IMPLANT
NS IRRIG 1000ML POUR BTL (IV SOLUTION) ×2 IMPLANT
PACK ORTHO CERVICAL (CUSTOM PROCEDURE TRAY) ×2 IMPLANT
PAD ARMBOARD 7.5X6 YLW CONV (MISCELLANEOUS) ×4 IMPLANT
PATTIES SURGICAL .5 X.5 (GAUZE/BANDAGES/DRESSINGS) IMPLANT
PIN DISTRACTION 14 (PIN) ×4 IMPLANT
PLATE SKYLINE 12MM (Plate) ×2 IMPLANT
SCREW SKYLINE VARIABLE LG (Screw) ×2 IMPLANT
SCREW VAR SELF TAP SKYLINE 14M (Screw) ×6 IMPLANT
SPACER ACF 7MM (Bone Implant) ×2 IMPLANT
SPONGE INTESTINAL PEANUT (DISPOSABLE) IMPLANT
SPONGE LAP 4X18 X RAY DECT (DISPOSABLE) IMPLANT
SPONGE SURGIFOAM ABS GEL 100 (HEMOSTASIS) ×2 IMPLANT
SUT ETHILON 4 0 PS 2 18 (SUTURE) ×2 IMPLANT
SUT VIC AB 2-0 CT1 27 (SUTURE) ×2
SUT VIC AB 2-0 CT1 TAPERPNT 27 (SUTURE) ×2 IMPLANT
SUT VIC AB 3-0 FS2 27 (SUTURE) IMPLANT
SUT VICRYL 4-0 PS2 18IN ABS (SUTURE) ×2 IMPLANT
SYR 20CC LL (SYRINGE) ×2 IMPLANT
SYR CONTROL 10ML LL (SYRINGE) ×2 IMPLANT
SYSTEM CHEST DRAIN TLS 7FR (DRAIN) ×2 IMPLANT
TOWEL OR 17X24 6PK STRL BLUE (TOWEL DISPOSABLE) ×2 IMPLANT
TOWEL OR 17X26 10 PK STRL BLUE (TOWEL DISPOSABLE) ×2 IMPLANT
WATER STERILE IRR 1000ML POUR (IV SOLUTION) ×2 IMPLANT

## 2014-05-06 NOTE — Discharge Instructions (Signed)
No lifting greater than 10 lbs. No overhead use of arms. °Avoid bending,and twisting neck. °Walk in house for first week them may start to get out slowly increasing distance up to one mile by 3 weeks post op. °Keep incision dry for 3 days, may then bathe and wet incision using a [place] collar when showering. °Call if any fevers >101, chills, or increasing numbness or weakness or increased swelling or drainage. ° °

## 2014-05-06 NOTE — Transfer of Care (Signed)
Immediate Anesthesia Transfer of Care Note  Patient: Karen Allison  Procedure(s) Performed: Procedure(s): ANTERIOR CERVICAL DISCECTOMY FUSION C3-4 with transgraft, local bone graft, plate and screws (N/A)  Patient Location: PACU  Anesthesia Type:General  Level of Consciousness: awake, oriented and patient cooperative  Airway & Oxygen Therapy: Patient Spontanous Breathing and Patient connected to nasal cannula oxygen  Post-op Assessment: Report given to PACU RN and Post -op Vital signs reviewed and stable  Post vital signs: Reviewed  Complications: No apparent anesthesia complications

## 2014-05-06 NOTE — H&P (Signed)
Karen Allison is an 68 y.o. female.   Chief Complaint: Increasing falls and abnormal gait. 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 The patient has urinary frequency due to recent urinary tract infection has been treated with Cipro. 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 Her examination demonstrates positive Hoffman sign the upper extremities with hyperreflexia in the biceps jerk and triceps,  knee with diminished reflex as well as diminished ankle reflexes to changes on the lower spine from severe lumbar stenosis and scoliosis with post surgical Changes, complicated lumbar surgery with hematoma causing cauda equina syndrome. Her course has been one of worsening symptoms of ataxia and loss of coordination of fine motor in the upper extremities due to cervical stenosis. She is able to control Voiding with occasional accidents, can not ambulate greater than 150 feet without assistance.  Past Medical History  Diagnosis Date  . Breast cancer   . Hypertension   . Pneumonia   . Hypothyroidism   . Chronic kidney disease     UTI off Cipro 7 days  . Arthritis   . Blood dyscrasia     bleeds and bruises easily  . Hepatitis      Hep A years ago  . Nose trouble     right nostril will hemorrhage at times    Past Surgical History  Procedure Laterality Date  . Lumpectomy    . Appendectomy    . Tonsillectomy    . Thyroidectomy    . Back surgery    . Abdominal hysterectomy    . Eye surgery Bilateral     cataracts  . Colonoscopy    . Orif humerus fracture Left 2013    History reviewed. No pertinent family history. Social History:  reports that she quit smoking about 52 years ago. Her smoking  use included Cigarettes. She smoked 0.00 packs per day. She has never used smokeless tobacco. She reports that she does not drink alcohol or use illicit drugs.  Allergies: No Known Allergies  Medications Prior to Admission  Medication Sig Dispense Refill  . atenolol (TENORMIN) 50 MG tablet Take 12.5 mg by mouth daily as needed (palpitations).       . Calcium Carbonate-Vitamin D (CALTRATE 600+D PO) Take 1 tablet by mouth 2 (two) times daily.      . celecoxib (CELEBREX) 200 MG capsule Take 200 mg by mouth daily.       . cholecalciferol (VITAMIN D) 1000 UNITS tablet Take 1,000 Units by mouth daily.      Marland Kitchen conjugated estrogens (PREMARIN) vaginal cream Place 0.5 Applicatorfuls vaginally 2 (two) times a week. 1/2 application vaginally two times a week.      . diazepam (VALIUM) 10 MG tablet Take 10 mg by mouth at bedtime.       . docusate sodium (COLACE) 100 MG capsule Take 100 mg by mouth 2 (two) times daily as needed for mild constipation.      . gabapentin (NEURONTIN) 600 MG tablet Take 600-1,200 mg by mouth 4 (four) times daily - after meals and at bedtime. 1 tablet (600 mg) 3 times a day and 2 tablets(1200mg ) at bedtime      . HYDROcodone-acetaminophen (NORCO) 7.5-325 MG per tablet Take 1  tablet by mouth every 6 (six) hours as needed (pain). For pain      . levothyroxine (SYNTHROID, LEVOTHROID) 50 MCG tablet Take 50 mcg by mouth daily.      . methocarbamol (ROBAXIN) 500 MG tablet Take 500 mg by mouth 4 (four) times daily.      . Multiple Vitamin (MULTIVITAMIN WITH MINERALS) TABS Take 1 tablet by mouth daily.      . promethazine (PHENERGAN) 25 MG tablet Take 25 mg by mouth every 6 (six) hours as needed for nausea or vomiting.      . trastuzumab (HERCEPTIN) 440 MG injection 440 mg by Intravenous (Continuous Infusion) route every 21 ( twenty-one) days. infusion every 3 weeks      . zolendronic acid (ZOMETA) 4 MG/5ML injection Inject 4 mg into the vein every 6 (six) months.         No results found  for this or any previous visit (from the past 48 hour(s)). No results found.  Review of Systems  Constitutional: Negative.  Negative for fever, chills, weight loss, malaise/fatigue and diaphoresis.  HENT: Negative for congestion, ear discharge, ear pain, hearing loss, nosebleeds, sore throat and tinnitus.   Eyes: Negative for blurred vision, double vision, photophobia, pain, discharge and redness.  Respiratory: Negative for cough, hemoptysis, sputum production, shortness of breath, wheezing and stridor.   Cardiovascular: Positive for leg swelling. Negative for chest pain, palpitations, orthopnea, claudication and PND.  Gastrointestinal: Negative for heartburn, nausea, vomiting, abdominal pain, diarrhea, constipation, blood in stool and melena.  Genitourinary: Positive for dysuria, urgency and frequency. Negative for hematuria and flank pain.  Musculoskeletal: Positive for falls and neck pain. Negative for back pain and joint pain.  Neurological: Positive for dizziness, tingling and sensory change. Negative for speech change, focal weakness, seizures, loss of consciousness, weakness and headaches.  Endo/Heme/Allergies: Negative for environmental allergies and polydipsia. Does not bruise/bleed easily.  Psychiatric/Behavioral: Negative for depression, suicidal ideas, hallucinations, memory loss and substance abuse. The patient is not nervous/anxious and does not have insomnia.     Blood pressure 125/42, pulse 54, temperature 97.7 F (36.5 C), resp. rate 18, height 5\' 1"  (1.549 m), weight 67.586 kg (149 lb), SpO2 98.00%. Physical Exam  Constitutional: She is oriented to person, place, and time. She appears well-developed and well-nourished. She appears distressed.  HENT:  Head: Normocephalic and atraumatic.  Right Ear: External ear normal.  Left Ear: External ear normal.  Nose: Nose normal.  Mouth/Throat: Oropharynx is clear and moist. No oropharyngeal exudate.  Eyes: Conjunctivae and EOM are  normal. Pupils are equal, round, and reactive to light. Right eye exhibits no discharge. Left eye exhibits no discharge.  Neck: No JVD present. No tracheal deviation present. No thyromegaly present.  Cardiovascular: Normal rate, regular rhythm, normal heart sounds and intact distal pulses.  Exam reveals no gallop and no friction rub.   No murmur heard. Respiratory: Effort normal and breath sounds normal. No stridor. No respiratory distress. She has no wheezes. She has no rales. She exhibits no tenderness.  GI: Soft. Bowel sounds are normal. She exhibits no distension and no mass. There is no tenderness. There is no rebound and no guarding.  Musculoskeletal: She exhibits no edema and no tenderness.  Lymphadenopathy:    She has no cervical adenopathy.  Neurological: She is alert and oriented to person, place, and time. She displays abnormal reflex. No cranial nerve deficit. She exhibits abnormal muscle tone. Coordination abnormal.  Skin: Skin is warm and dry. No rash noted.  She is not diaphoretic. No erythema. No pallor.  Psychiatric: She has a normal mood and affect. Her behavior is normal. Judgment and thought content normal.    Orthopedic examination:  68 year old right-handed female she is awake alert and oriented x4. Clinical exam shows diminished range of motion of the cervical spine lateral bending rotation by about 40% both sides. Extension of the neck is nil for impending chin 2-1/2 finger breaths and sternum. Upper sham the motor shows some weakness and shoulder abduction is 5 minus of 5 bilaterally biceps strength also slightly decreased at 5-5 left side greater than right. She has a positive Hoffman sign both upper extremities. Hyperreflexia is present at the biceps triceps and brachial radialis. Patient has hyperreflexia secondary to C3-4 stenosis. Hyperreflexia in the lower extremities is noted. MRI scan was done October of 2014 that demonstrated cervical stenosis localizing to the C3-4  level with myelomalacia changes. 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 Breast Cancer, stage IV, has undergone chemotherapy and radiation therapy. Kidney insufficiency. 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 MRSA positive nasal swab.  Plan: OR for C3-4 ACDF.Marland Kitchen  Plan: OR for C3-4 ACDF for cervical stenosis.  Loa Idler E 05/06/2014, 7:48 AM

## 2014-05-06 NOTE — Anesthesia Postprocedure Evaluation (Signed)
  Anesthesia Post-op Note  Patient: Karen Allison  Procedure(s) Performed: Procedure(s): ANTERIOR CERVICAL DISCECTOMY FUSION C3-4 with transgraft, local bone graft, plate and screws (N/A)  Patient Location: PACU  Anesthesia Type:General  Level of Consciousness: awake and alert   Airway and Oxygen Therapy: Patient Spontanous Breathing  Post-op Pain: mild  Post-op Assessment: Post-op Vital signs reviewed  Post-op Vital Signs: Reviewed and stable  Last Vitals:  Filed Vitals:   05/06/14 0624  BP: 125/42  Pulse: 54  Temp: 36.5 C  Resp: 18    Complications: No apparent anesthesia complications

## 2014-05-06 NOTE — Anesthesia Procedure Notes (Signed)
Procedure Name: Intubation Date/Time: 05/06/2014 7:48 AM Performed by: Jenne Campus Pre-anesthesia Checklist: Patient identified, Emergency Drugs available, Suction available, Patient being monitored and Timeout performed Patient Re-evaluated:Patient Re-evaluated prior to inductionOxygen Delivery Method: Circle system utilized Preoxygenation: Pre-oxygenation with 100% oxygen Intubation Type: IV induction Ventilation: Mask ventilation without difficulty Tube type: Oral Tube size: 7.0 mm Number of attempts: 1 Airway Equipment and Method: Stylet and Video-laryngoscopy Placement Confirmation: positive ETCO2,  CO2 detector and breath sounds checked- equal and bilateral Secured at: 21 cm Tube secured with: Tape Dental Injury: Teeth and Oropharynx as per pre-operative assessment

## 2014-05-06 NOTE — Anesthesia Preprocedure Evaluation (Addendum)
Anesthesia Evaluation  Patient identified by MRN, date of birth, ID band Patient awake    Reviewed: Allergy & Precautions, H&P , NPO status , Patient's Chart, lab work & pertinent test results  Airway Mallampati: II  Neck ROM: Full    Dental  (+) Dental Advisory Given, Teeth Intact   Pulmonary former smoker (quit 1963),  breath sounds clear to auscultation        Cardiovascular hypertension, Pt. on medications Rhythm:Regular Rate:Normal  ECHO EF 55%10 years ago   Neuro/Psych    GI/Hepatic   Endo/Other    Renal/GU      Musculoskeletal Osteoporosis   Abdominal (+)  Abdomen: soft.    Peds  Hematology   Anesthesia Other Findings   Reproductive/Obstetrics                         Anesthesia Physical Anesthesia Plan  ASA: III  Anesthesia Plan: General   Post-op Pain Management:    Induction: Intravenous  Airway Management Planned: Oral ETT and Video Laryngoscope Planned  Additional Equipment:   Intra-op Plan:   Post-operative Plan: Extubation in OR  Informed Consent: I have reviewed the patients History and Physical, chart, labs and discussed the procedure including the risks, benefits and alternatives for the proposed anesthesia with the patient or authorized representative who has indicated his/her understanding and acceptance.     Plan Discussed with:   Anesthesia Plan Comments:         Anesthesia Quick Evaluation

## 2014-05-06 NOTE — Brief Op Note (Signed)
05/06/2014  10:06 AM  PATIENT:  Georgia Dom  68 y.o. female  PRE-OPERATIVE DIAGNOSIS:  C3-4 Cervical Stenosis  POST-OPERATIVE DIAGNOSIS:  C3-4 Cervical Stenosis  PROCEDURE:  Procedure(s): ANTERIOR CERVICAL DISCECTOMY FUSION C3-4 with transgraft, local bone graft, plate and screws (N/A)  SURGEON:  Surgeon(s) and Role:    * Jessy Oto, MD - Primary  PHYSICIAN ASSISTANT:Sheila Lynnae Sandhoff, PA-C   ANESTHESIA:   local and general  EBL:  Total I/O In: 1200 [I.V.:1200] Out: -   BLOOD ADMINISTERED:none  DRAINS: (7 french) TLS Drain(s) to suction in the left anterior neck   LOCAL MEDICATIONS USED:  MARCAINE    and Amount: 10 ml  SPECIMEN:  No Specimen  DISPOSITION OF SPECIMEN:  N/A  COUNTS:  YES  TOURNIQUET:  * No tourniquets in log *  DICTATION: .Dragon Dictation  PLAN OF CARE: Admit for overnight observation  PATIENT DISPOSITION:  PACU - hemodynamically stable.   Delay start of Pharmacological VTE agent (>24hrs) due to surgical blood loss or risk of bleeding: yes

## 2014-05-06 NOTE — Progress Notes (Signed)
Patient states her right upper lip feels sore. It is a little puffy. Cold compress was applied.

## 2014-05-06 NOTE — Progress Notes (Signed)
Orthopedic Tech Progress Note Patient Details:  Karen Allison Apr 21, 1946 [id number]  Ortho Devices Type of Ortho Device: Soft collar Ortho Device/Splint Interventions: Application   Irish Elders 05/06/2014, 11:08 AM

## 2014-05-06 NOTE — Op Note (Signed)
05/06/2014  10:11 AM  PATIENT:  Karen Allison  68 y.o. 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 Tresa Moore.    COMPLICATIONS:  None.  EBL: <50cc  DRAINS: 7 French TLS left anterior neck, sewn in place.     COMPONENTS: 4m Depuy slimline cervical locking plate and 3 x 126RSscrews, left L4 134mrevision screw. 56m42mordotic ACDF allograft bone graft.  PROCEDURE:The patient was met in the holding area, and the appropriate left C3-4 cervical level identified and marked with an "x" and my initials. The patient was then transported to OR and was placed on the operative table in a supine position head supported on the well padded [place]field horseshoe. The patient was then placed under general anesthesia without difficulty intubated atraumaticly.  Cervical spine was positioned with a [place] horseshoe and 5 pound cervical halter traction. All pressure points well padded and semi-beach chair position. Arm holder both arms. Standard prep with DuraPrep solution the anterior cervical spine chest. Draped in the usual manner. Iodine vi drape was used. Standard timeout protocol was carried out identifying the patient procedure side of the procedure and level. The skin the left neck was infiltrated with Marcaine with epinephrine total of 10cc. This at the level of expected C3-4 incision and also along a skin crease in line with the patients lines of Langer. Incision transverse at the C3-4 level and carried down to the level of the platysma. Then was  carried down to the anterior aspect of the sternocleidomastoid muscle. The interval between the trachea and esophagus medially and the carotid sheath laterally was developed as a Metzenbaum scissors and blunt dissection exposing the anterior aspect of cervical spine at the C3-4 level. The prevertebral fascia anterior to the cervical was cauterized with bipolar and teased across the midline with a KitArt therapistn 18-gauge spinal needle was placed with sheath intact allowing only a centimeter to extend into the C3-4 disc and observed on lateral C arm imaging at the C3-4 level. Handheld Cloward retraction of the soft tissues while identifying the level at C3-4 and also while removing a portion of the anterior aspect of the disc with15 blade scalpel and pituitary.  Medial border of the longus collie muscles was carefully elevated bilaterally and self-retaining retractors were introduced the foot of the blade beneath the medial border of the longus colli muscles. Soft tissue overlying the anterior borders of the disc space at C3-4 level carefully debridement of soft tissue back to bony edges. The anterior lip osteophytes were then resected using rongeur. This bone was preserved for later bone grafting purposes. The disc space was then first prepared using loupe magnification and headlight with resection of degenerative disc annulus anteriorly and posteriorly and cartilaginous endplates using micro-curettes pituitaries and a high-speed bur. The operating room microscope was draped sterilely and brought into the field. Under the operating room microscope and posterior aspect of the disc was excised using micro-curettes pituitary rongeur and times per. Posterior lip osteophytes were drilled using a high-speed bur and a carefully  resected with 1 and 2 mm Kerrison foraminotomy performed over both C4 nerve roots using 1 and 2 mm Kerrisons disc herniation material was noted centrally and into the right foramen some of  which represented reflected portion of the patient's annulus into the neuroforamen most of the disc appeared to be calcified annulus and disc osteophyte. Following decompression of the spinal cord and both C4 nerve roots, irrigation was carried out. The endplates of the inferior aspect of C3 and superior aspect of C4 were carefully prepared using a high-speed bur to parallel. The disc space was then sounded utilized and a precontoured sounder for the transgraft implant. Surrounding was carried up to a 29m implant. 7 mm lordotic ACDF spacer was felt to provide best fit for the C3-4 disc space. The ACDF permanent lordotic allograft implant was then brought onto the field. It was then packed with local bone graft that had been harvested previously. The implant was then carefully placed over the intervertebral disc space at C3-4 level. Care taken to ensure that no bone or soft tissue debris within the disc space that could be retropulsed with insertion of the cage and bone graft. The implant was then impacted into place with the head placed in longitudinal cervical traction. The implant was felt to be in excellent position alignment. Cervical distraction instrumentation was removed and the screw post holes coated with wax for hemostasis. Length of the plate was then chosen using plate applied to a from the edge of the lower cervical vertebral border of C4 the upper cervical border of C3.  A 151mlimeter plate was chosen. 14 mm screws were then placed into the C3 locking the plate in place. Additional 14 mm screws were then placed in the C4 level cervical traction had been released prior to screw placement. Intraoperative lateral radiograph demonstrated the plates and screws in good position  With the left C4 screw 2 mm long impinging on the ventral cervical canal. This left C4 screw was exchanged for a 1258mevision screw and repeat C-arm demonstrated the plates and screws in good alignment with no impingment on the  cervical canal. The graft appeared in good position alignment.   This point irrigation was carried out at cervical incision site.The esophagus examined at the cervical level and found to be normal. Irrigation was again carried out there was no active bleeding present. A 7 french TLS drain placed over the left side of the cervical plate and anterior cervical spine exiting inferior to the incision and sewn in place with a 4-O Nylon suture.The incisions were then closed by approximating the deep subcutaneous layers the platysma layer with interrupted 3-0 Vicryl suture and the superficial fascia overlying the sternocleidomastoid muscle with interrupted 3-0 Vicryl sutures. The subcutaneous layers were approximated with interrupted 3-0 Vicryl sutures as were the superficial layers. The skin was closed with a running subcutaneous stitch of 4-0 Vicryl at the operative C3-4 transverse incision site. Skin was approximated with Dermabond. Mepilex bandage was applied. A soft cervical collar was then applied to the cervical traction released on the cervical spine and drapes were removed. Patient was then reactivated extubated and returned to recovery room in satisfactory condition.   ShePhillips Hay-C perform the duties of assistant surgeon during this case. She was present from the beginning of the case to the end of the case assisting in transfer the patient from his stretcher to the OR table and back to the stretcher at the end of the case. Assisted in careful  suction of the anterior cervical discectomy site and delicate neural structures operating under the operating room microscope. She performed closure of the incision from the fascia to the skin applying the dressing.    Karen Allison E 05/06/2014, 10:11 AM

## 2014-05-06 NOTE — Interval H&P Note (Signed)
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 After consideration of risks, benefits and other options for treatment, the patient has consented to  Procedure(s): ANTERIOR CERVICAL DISCECTOMY FUSION C3-4 with transgraft, local bone graft, plate and screws (N/A) as a surgical intervention .  The patient's history has been reviewed, patient examined, no change in status, stable for surgery.  I have reviewed the patient's chart and labs.  Questions were answered to the patient's satisfaction.     Karen Allison

## 2014-05-07 DIAGNOSIS — M47812 Spondylosis without myelopathy or radiculopathy, cervical region: Secondary | ICD-10-CM | POA: Diagnosis present

## 2014-05-07 DIAGNOSIS — Z87891 Personal history of nicotine dependence: Secondary | ICD-10-CM | POA: Diagnosis not present

## 2014-05-07 DIAGNOSIS — M5380 Other specified dorsopathies, site unspecified: Secondary | ICD-10-CM | POA: Diagnosis present

## 2014-05-07 DIAGNOSIS — Z853 Personal history of malignant neoplasm of breast: Secondary | ICD-10-CM | POA: Diagnosis not present

## 2014-05-07 DIAGNOSIS — M4180 Other forms of scoliosis, site unspecified: Secondary | ICD-10-CM | POA: Diagnosis present

## 2014-05-07 DIAGNOSIS — I129 Hypertensive chronic kidney disease with stage 1 through stage 4 chronic kidney disease, or unspecified chronic kidney disease: Secondary | ICD-10-CM | POA: Diagnosis present

## 2014-05-07 DIAGNOSIS — R21 Rash and other nonspecific skin eruption: Secondary | ICD-10-CM | POA: Diagnosis present

## 2014-05-07 DIAGNOSIS — R269 Unspecified abnormalities of gait and mobility: Secondary | ICD-10-CM | POA: Diagnosis present

## 2014-05-07 DIAGNOSIS — N189 Chronic kidney disease, unspecified: Secondary | ICD-10-CM | POA: Diagnosis present

## 2014-05-07 DIAGNOSIS — N882 Stricture and stenosis of cervix uteri: Secondary | ICD-10-CM | POA: Diagnosis present

## 2014-05-07 DIAGNOSIS — Z79899 Other long term (current) drug therapy: Secondary | ICD-10-CM | POA: Diagnosis not present

## 2014-05-07 DIAGNOSIS — E039 Hypothyroidism, unspecified: Secondary | ICD-10-CM | POA: Diagnosis present

## 2014-05-07 MED FILL — Thrombin For Soln 20000 Unit: CUTANEOUS | Qty: 1 | Status: AC

## 2014-05-07 NOTE — Progress Notes (Signed)
Patient provided with discharge instructions and follow up information. She is going home with her husband at this time. No equipment or further needs at this time.

## 2014-05-07 NOTE — Discharge Summary (Signed)
Physician Discharge Summary  Patient ID: Karen Allison MRN: [id number] DOB/AGE: [birth year] 68 y.o.  Admit date: 05/06/2014 Discharge date: 05/07/2014  Admission Diagnoses:  Spinal stenosis in cervical region C3-4  Discharge Diagnoses:  Principal Problem:   Spinal stenosis in cervical region Active Problems:   Cervical spondylosis without myelopathy   HNP (herniated nucleus pulposus), cervical at C3-4  Past Medical History  Diagnosis Date  . Breast cancer   . Hypertension   . Pneumonia   . Hypothyroidism   . Chronic kidney disease     UTI off Cipro 7 days  . Arthritis   . Blood dyscrasia     bleeds and bruises easily  . Hepatitis      Hep A years ago  . Nose trouble     right nostril will hemorrhage at times    Surgeries: Procedure(s): ANTERIOR CERVICAL DISCECTOMY FUSION C3-4 with transgraft, local bone graft, plate and screws on 8/67/6720   Consultants (if any):  none  Discharged Condition: Improved  Hospital Course: Karen Allison is an 68 y.o. female who was admitted 05/06/2014 with a diagnosis of Spinal stenosis in cervical region and went to the operating room on 05/06/2014 and underwent the above named procedures.    She was given perioperative antibiotics:  Anti-infectives   Start     Dose/Rate Route Frequency Ordered Stop   05/06/14 1530  ceFAZolin (ANCEF) IVPB 1 g/50 mL premix     1 g 100 mL/hr over 30 Minutes Intravenous Every 8 hours 05/06/14 1506 05/07/14 0000   05/06/14 0630  vancomycin (VANCOCIN) IVPB 1000 mg/200 mL premix     1,000 mg 200 mL/hr over 60 Minutes Intravenous  Once 05/06/14 0550 05/06/14 0825   05/06/14 0600  ceFAZolin (ANCEF) IVPB 2 g/50 mL premix     2 g 100 mL/hr over 30 Minutes Intravenous On call to O.R. 05/05/14 1409 05/06/14 0746   05/06/14 0552  ceFAZolin (ANCEF) IVPB 2 g/50 mL premix  Status:  Discontinued     2 g 100 mL/hr over 30 Minutes Intravenous On call to O.R. 05/06/14 0552 05/06/14 1459    Pt had positive nasal  swab for MRSA pre op.  Above antibiotics utilized.  Contact precautions utilized post op. Drain removed and no active bleeding from wound.  Pt ambulatory and eating a regular diet.   Discharged to home in stable condition with husband. Soft cervical collar fit appropriately and pt will wear at all times.  [place] collar given for use in shower. Pain well controlled with po meds.  Mild sore throat treated and relieved with Cepacol lozenges.    She was given sequential compression devices, early ambulation DVT prophylaxis.  She benefited maximally from the hospital stay and there were no complications.    Recent vital signs:  Filed Vitals:   05/07/14 0615  BP: 104/53  Pulse: 63  Temp: 97.6 F (36.4 C)  Resp: 16    Recent laboratory studies:  Lab Results  Component Value Date   HGB 13.2 04/23/2014   HGB 9.3* 04/13/2007   HGB 9.2* 04/12/2007   Lab Results  Component Value Date   WBC 7.7 04/23/2014   PLT 184 04/23/2014   No results found for this basename: INR   Lab Results  Component Value Date   NA 140 04/23/2014   K 4.4 04/23/2014   CL 101 04/23/2014   CO2 28 04/23/2014   BUN 11 04/23/2014   CREATININE 0.78 04/23/2014   GLUCOSE 96 04/23/2014  Discharge Medications:     Medication List         atenolol 50 MG tablet  Commonly known as:  TENORMIN  Take 12.5 mg by mouth daily as needed (palpitations).     CALTRATE 600+D PO  Take 1 tablet by mouth 2 (two) times daily.     CELEBREX 200 MG capsule  Generic drug:  celecoxib  Take 200 mg by mouth daily.     cholecalciferol 1000 UNITS tablet  Commonly known as:  VITAMIN D  Take 1,000 Units by mouth daily.     conjugated estrogens vaginal cream  Commonly known as:  PREMARIN  Place 0.5 Applicatorfuls vaginally 2 (two) times a week. 1/2 application vaginally two times a week.     diazepam 10 MG tablet  Commonly known as:  VALIUM  Take 10 mg by mouth at bedtime.     docusate sodium 100 MG capsule  Commonly known as:  COLACE   Take 100 mg by mouth 2 (two) times daily as needed for mild constipation.     gabapentin 600 MG tablet  Commonly known as:  NEURONTIN  Take 600-1,200 mg by mouth 4 (four) times daily - after meals and at bedtime. 1 tablet (600 mg) 3 times a day and 2 tablets(1200mg ) at bedtime     HERCEPTIN 440 MG injection  Generic drug:  trastuzumab  440 mg by Intravenous (Continuous Infusion) route every 21 ( twenty-one) days. infusion every 3 weeks     HYDROcodone-acetaminophen 7.5-325 MG per tablet  Commonly known as:  NORCO  Take 1 tablet by mouth every 6 (six) hours as needed (pain). For pain     levothyroxine 50 MCG tablet  Commonly known as:  SYNTHROID, LEVOTHROID  Take 50 mcg by mouth daily.     methocarbamol 500 MG tablet  Commonly known as:  ROBAXIN  Take 500 mg by mouth 4 (four) times daily.     multivitamin with minerals Tabs tablet  Take 1 tablet by mouth daily.     oxyCODONE-acetaminophen 5-325 MG per tablet  Commonly known as:  ROXICET  Take 1-2 tablets by mouth every 4 (four) hours as needed for severe pain.     promethazine 25 MG tablet  Commonly known as:  PHENERGAN  Take 25 mg by mouth every 6 (six) hours as needed for nausea or vomiting.     zolendronic acid 4 MG/5ML injection  Commonly known as:  ZOMETA  Inject 4 mg into the vein every 6 (six) months.        Diagnostic Studies: Dg Chest 2 View  04/23/2014   CLINICAL DATA:  68 year old female preoperative study for cervical spine surgery. Initial encounter.  EXAM: CHEST  2 VIEW  COMPARISON:  04/10/2007.  FINDINGS: Interval lumbar fusion hardware placement. Stable lung volumes, mild eventration of the right hemidiaphragm. Stable cardiac size at the upper limits of normal. Other mediastinal contours are within normal limits. Visualized tracheal air column is within normal limits. Chronic postoperative changes to the left breast. No pneumothorax, pulmonary edema, pleural effusion or acute pulmonary opacity. Very advanced  and progressed degenerative changes at the left glenohumeral joint.  IMPRESSION: No acute cardiopulmonary abnormality.   Electronically Signed   By: Lars Pinks M.D.   On: 04/23/2014 14:45   Dg Cervical Spine 2-3 Views  05/06/2014   CLINICAL DATA:  Anterior cervical disc fusion of C3-4.  EXAM: DG C-ARM 61-120 MIN; CERVICAL SPINE - 2-3 VIEW  COMPARISON:  MRI of June 21, 2013.  FINDINGS: Two intraoperative fluoroscopic images of the cervical spine were submitted for review. These demonstrate the patient to be status post surgical anterior fusion of C3-4. Good alignment of vertebral bodies is noted.  IMPRESSION: Status post surgical anterior fusion of C3-4.   Electronically Signed   By: Sabino Dick M.D.   On: 05/06/2014 10:16   Dg C-arm 1-60 Min  05/06/2014   CLINICAL DATA:  Anterior cervical disc fusion of C3-4.  EXAM: DG C-ARM 61-120 MIN; CERVICAL SPINE - 2-3 VIEW  COMPARISON:  MRI of June 21, 2013.  FINDINGS: Two intraoperative fluoroscopic images of the cervical spine were submitted for review. These demonstrate the patient to be status post surgical anterior fusion of C3-4. Good alignment of vertebral bodies is noted.  IMPRESSION: Status post surgical anterior fusion of C3-4.   Electronically Signed   By: Sabino Dick M.D.   On: 05/06/2014 10:16    Disposition: 06-Home-Health Care Svc      Discharge Instructions   Call MD / Call 911    Complete by:  As directed   If you experience chest pain or shortness of breath, CALL 911 and be transported to the hospital emergency room.  If you develope a fever above 101 F, pus (white drainage) or increased drainage or redness at the wound, or calf pain, call your surgeon's office.     Constipation Prevention    Complete by:  As directed   Drink plenty of fluids.  Prune juice may be helpful.  You may use a stool softener, such as Colace (over the counter) 100 mg twice a day.  Use MiraLax (over the counter) for constipation as needed.     Diet - low  sodium heart healthy    Complete by:  As directed      Discharge instructions    Complete by:  As directed   No lifting greater than 10 lbs. No overhead use of arms. Avoid bending,and twisting neck. Walk in house for first week them may start to get out slowly increasing distance up to one mile by 3 weeks post op. Keep incision dry for 3 days, may then bathe and wet incision using a [place] collar when showering. Call if any fevers >101, chills, or increasing numbness or weakness or increased swelling or drainage. Wear soft collar at all times. Plastic collar for showers     Driving restrictions    Complete by:  As directed   No driving     Increase activity slowly as tolerated    Complete by:  As directed      Lifting restrictions    Complete by:  As directed   No lifting for 12 weeks           Follow-up Information   Follow up with NITKA,JAMES E, MD. Schedule an appointment as soon as possible for a visit in 2 weeks.   Specialty:  Orthopedic Surgery   Contact information:   [place] [address] [phone]        Signed: Epimenio Foot 05/07/2014, 8:42 AM

## 2014-05-07 NOTE — Progress Notes (Signed)
Subjective: 1 Day Post-Op Procedure(s) (LRB): ANTERIOR CERVICAL DISCECTOMY FUSION C3-4 with transgraft, local bone graft, plate and screws (N/A) Patient reports pain as mild.  Mild sore throat.  Ate breakfast.  Voiding well.  OOB to bathroom.  Objective: Vital signs in last 24 hours: Temp:  [97.4 F (36.3 C)-98.1 F (36.7 C)] 97.6 F (36.4 C) (09/16 0615) Pulse Rate:  [51-73] 63 (09/16 0615) Resp:  [10-20] 16 (09/16 0615) BP: (104-140)/(48-69) 104/53 mmHg (09/16 0615) SpO2:  [91 %-100 %] 100 % (09/16 0615)  Intake/Output from previous day: 09/15 0701 - 09/16 0700 In: 3390 [P.O.:480; I.V.:2910] Out: 1000 [Urine:1000] Intake/Output this shift: Total I/O In: 175 [I.V.:175] Out: -   No results found for this basename: HGB,  in the last 72 hours No results found for this basename: WBC, RBC, HCT, PLT,  in the last 72 hours No results found for this basename: NA, K, CL, CO2, BUN, CREATININE, GLUCOSE, CALCIUM,  in the last 72 hours No results found for this basename: LABPT, INR,  in the last 72 hours  Neurovascular intact Sensation intact distally Incision: no drainage from incision.  drain removed without difficulty.  small amount of bloody drainage from drain site.  Assessment/Plan: 1 Day Post-Op Procedure(s) (LRB): ANTERIOR CERVICAL DISCECTOMY FUSION C3-4 with transgraft, local bone graft, plate and screws (N/A) Up with therapy then home with husband. Soft collar at all times rx on chart. OV 2 weeks.  Angelik Walls M 05/07/2014, 8:27 AM

## 2014-05-07 NOTE — Evaluation (Signed)
Occupational Therapy Evaluation Patient Details Name: Karen Allison MRN: [id number] DOB: May 21, 1946 Today's Date: 05/07/2014    History of Present Illness 68 y.o. female s/p ANTERIOR CERVICAL DISCECTOMY FUSION C3-4.   Clinical Impression   Pt admitted with the above diagnoses and presents with below problem list. Pt will benefit from continued acute OT to address the below listed deficits and maximize independence with basic ADLs prior to d/c home with spouse. PTA pt was mod I with ADLs. Pt currently at min guard level for LB ADLs.       Follow Up Recommendations  Supervision/Assistance - 24 hour;No OT follow up    Equipment Recommendations  None recommended by OT;Other (comment) (Pt has recommended equipment)    Recommendations for Other Services       Precautions / Restrictions Precautions Precautions: Cervical Precaution Comments: Reviewed cervical precautions Required Braces or Orthoses: Cervical Brace Cervical Brace: Soft collar;At all times Restrictions Weight Bearing Restrictions: Yes      Mobility Bed Mobility Overal bed mobility: Modified Independent             General bed mobility comments: HOB elevated, used rails; has adjustable bed with rail at home  Transfers Overall transfer level: Needs assistance Equipment used: Rolling walker (2 wheeled) Transfers: Sit to/from Stand Sit to Stand: Min guard         General transfer comment: cues for hand placement and to keep rw in front of her at all times including while pivoting so rw is in fromt of her when she goes to sit down     Balance Overall balance assessment: Needs assistance Sitting-balance support: No upper extremity supported;Feet supported Sitting balance-Leahy Scale: Fair     Standing balance support: Bilateral upper extremity supported;During functional activity Standing balance-Leahy Scale: Fair                              ADL Overall ADL's : Needs  assistance/impaired Eating/Feeding: Set up;Sitting   Grooming: Set up;Sitting;Standing   Upper Body Bathing: Sitting;Set up   Lower Body Bathing: Min guard;With adaptive equipment;Sit to/from stand   Upper Body Dressing : Set up;Sitting   Lower Body Dressing: Min guard;With adaptive equipment;Sit to/from stand   Toilet Transfer: Min guard;Ambulation;RW;Comfort height toilet;Grab bars   Toileting- Clothing Manipulation and Hygiene: Min guard;Sit to/from stand   Tub/ Shower Transfer: Min guard;Ambulation;3 in 1;Shower seat;Rolling walker   Functional mobility during ADLs: Min guard;Rolling walker General ADL Comments: Educated on techniques and AE for safe completion of ADLs with cervical precautions. Educated on safety tips for home to prevent falls (no throw rugs, put up dogs while ambulating or put bell on dog collar, bag on rw). Educated on use of reacher to compensate for no bending and overhead reach.     Vision                     Perception     Praxis      Pertinent Vitals/Pain Pain Assessment: 0-10 Pain Score: 2  Pain Location: neck Pain Descriptors / Indicators: Aching Pain Intervention(s): Limited activity within patient's tolerance;Monitored during session     Hand Dominance Right   Extremity/Trunk Assessment Upper Extremity Assessment Upper Extremity Assessment: Overall WFL for tasks assessed   Lower Extremity Assessment Lower Extremity Assessment: Defer to PT evaluation       Communication Communication Communication: No difficulties   Cognition Arousal/Alertness: Awake/alert Behavior During Therapy: [facility] for tasks  assessed/performed Overall Cognitive Status: Within Functional Limits for tasks assessed                     General Comments       Exercises       Shoulder Instructions      Home Living Family/patient expects to be discharged to:: Private residence Living Arrangements: Spouse/significant other Available Help at  Discharge: Family;Available 24 hours/day Type of Home: House Home Access: Stairs to enter [employer] of Steps: 2 Entrance Stairs-Rails: Left Home Layout: Two level;Able to live on main level with bedroom/bathroom     Bathroom Shower/Tub: Occupational psychologist: Standard Bathroom Accessibility: Yes How Accessible: Accessible via walker Home Equipment: [place] - 4 wheels;Walker - 2 wheels;Cane - quad;Bedside commode;Shower seat;Shower seat - built Hotel manager: Reacher        Prior Functioning/Environment Level of Independence: Independent with assistive device(s)        Comments: rollator for ambulation    OT Diagnosis: Acute pain   OT Problem List: Decreased activity tolerance;Impaired balance (sitting and/or standing);Decreased knowledge of use of DME or AE;Decreased knowledge of precautions;Pain   OT Treatment/Interventions: Self-care/ADL training;Therapeutic exercise;DME and/or AE instruction;Therapeutic activities;Patient/family education;Balance training    OT Goals(Current goals can be found in the care plan section) Acute Rehab OT Goals Patient Stated Goal: not stated OT Goal Formulation: With patient Time For Goal Achievement: 05/14/14 Potential to Achieve Goals: Good ADL Goals Pt Will Perform Grooming: with modified independence;sitting;standing Pt Will Perform Lower Body Bathing: with modified independence;sit to/from stand Pt Will Perform Lower Body Dressing: with modified independence;sit to/from stand;with adaptive equipment Pt Will Transfer to Toilet: with modified independence;ambulating;bedside commode Pt Will Perform Toileting - Clothing Manipulation and hygiene: with modified independence;sit to/from stand  OT Frequency: Min 2X/week   Barriers to D/C:            Co-evaluation              End of Session Equipment Utilized During Treatment: Rolling walker;Cervical collar  Activity Tolerance:  Patient tolerated treatment well Patient left: in bed;with call bell/phone within reach   Time: 1211-1235 OT Time Calculation (min): 24 min Charges:  OT General Charges $OT Visit: 1 Procedure OT Evaluation $Initial OT Evaluation Tier I: 1 Procedure OT Treatments $Self Care/Home Management : 8-22 mins G-Codes:    Hortencia Pilar 06/03/14, 1:24 PM

## 2014-05-07 NOTE — Evaluation (Signed)
Physical Therapy Evaluation Patient Details Name: Karen Allison MRN: [id number] DOB: 06/03/1946 Today's Date: 05/07/2014   History of Present Illness  68 y.o. female s/p ANTERIOR CERVICAL DISCECTOMY FUSION C3-4.  Clinical Impression  Patient evaluated by Physical Therapy with no further acute PT needs identified. All education has been completed and the patient has no further questions. Recommend outpatient neuro rehab due to history of frequent falls with LE weakness and diminished sensation bilaterally. She will have 24 hour care at home from family as needed. See below for any follow-up Physial Therapy or equipment needs. PT is signing off. Thank you for this referral.     Follow Up Recommendations Outpatient PT;Other (comment) (Neuro rehab)    Equipment Recommendations  None recommended by PT    Recommendations for Other Services OT consult     Precautions / Restrictions Precautions Precautions: Cervical Precaution Comments: Reviewed cervical precautions Required Braces or Orthoses: Cervical Brace Cervical Brace: Soft collar;At all times Restrictions Weight Bearing Restrictions: Yes      Mobility  Bed Mobility Overal bed mobility: Modified Independent                Transfers Overall transfer level: Needs assistance Equipment used: Rolling walker (2 wheeled) Transfers: Sit to/from Stand Sit to Stand: Min guard         General transfer comment: Min guard for safety with VC for hand placement. Performed from lowest bed setting and low table mat. Pt using posterior knees for support on mat table and required several attempts to safely perform this task; Practiced x 5 with instructions for positioning and hand placement.  Ambulation/Gait Ambulation/Gait assistance: Min guard Ambulation Distance (Feet): 65 Feet (x2) Assistive device: Rolling walker (2 wheeled) Gait Pattern/deviations: Step-through pattern;Decreased stride length;Ataxic;Trendelenburg;Narrow  base of support   Gait velocity interpretation: Below normal speed for age/gender General Gait Details: Educated on safe DME use with a rolling walker. Prominent left trendelenburg present with intermittent cues to correct this. No loss of balance noted with use of rolling walker.  Stairs Stairs: Yes Stairs assistance: Min guard Stair Management: One rail Left;Step to pattern;Sideways;Forwards Number of Stairs: 3 (x2) General stair comments: Educated on safe stair navigation with rail on left similar to home environment. She was able to safely complete this task with extra time. Practiced forwards and sideways approach. She has no further questions.  Wheelchair Mobility    Modified Rankin (Stroke Patients Only)       Balance Overall balance assessment: Needs assistance;History of Falls Sitting-balance support: No upper extremity supported;Feet supported Sitting balance-Leahy Scale: Fair     Standing balance support: No upper extremity supported;During functional activity Standing balance-Leahy Scale: Fair                               Pertinent Vitals/Pain Pain Assessment: 0-10 Pain Score: 2  Pain Location: "Nerve pain that goes to my feet" Pain Descriptors / Indicators: Constant Pain Intervention(s): Limited activity within patient's tolerance;Monitored during session;Repositioned    Home Living Family/patient expects to be discharged to:: Private residence Living Arrangements: Spouse/significant other Available Help at Discharge: Family;Available 24 hours/day Type of Home: House Home Access: Stairs to enter Entrance Stairs-Rails: Left Entrance Stairs-Number of Steps: 2 Home Layout: Two level;Able to live on main level with bedroom/bathroom Home Equipment: Walker - 4 wheels;Walker - 2 wheels;Cane - quad;Bedside commode;Shower seat;Shower seat - built in (3-in-1)      Prior Function Level of Independence:  Independent with assistive device(s)          Comments: rollator for ambulation     Hand Dominance   Dominant Hand: Right    Extremity/Trunk Assessment   Upper Extremity Assessment: Defer to OT evaluation           Lower Extremity Assessment: LLE deficits/detail   LLE Deficits / Details: MMT: hip flexion 4/5, knee extension 4-/5, knee flexion 4+/5, dorsiflexion 4-/5, hallux extension 4-/5.  Cervical / Trunk Assessment: Other exceptions (In soft collar)  Communication   Communication: No difficulties  Cognition Arousal/Alertness: Awake/alert Behavior During Therapy: WFL for tasks assessed/performed Overall Cognitive Status: Within Functional Limits for tasks assessed                      General Comments General comments (skin integrity, edema, etc.): Pt reports long standing weakness and decreased sensation in bilateral LEs Lt>Rt. Reports these symptoms worsened just prior to cervical surgery. Pts LLE mildy edematous and pt reports this is baseline although slightly more edematous since surgery. Reviewed cervical precautions and required cues intermittently throughout therapy session. Time spent discussing d/c planning and role of PT for balance deficits.    Exercises        Assessment/Plan    PT Assessment All further PT needs can be met in the next venue of care  PT Diagnosis Difficulty walking;Abnormality of gait;Generalized weakness;Acute pain   PT Problem List Decreased strength;Decreased range of motion;Decreased activity tolerance;Decreased balance;Decreased mobility;Decreased coordination;Decreased knowledge of use of DME;Decreased knowledge of precautions;Impaired sensation;Pain  PT Treatment Interventions     PT Goals (Current goals can be found in the Care Plan section) Acute Rehab PT Goals Patient Stated Goal: Return home and get back to her water aerobics PT Goal Formulation: No goals set, d/c therapy    Frequency     Barriers to discharge        Co-evaluation                End of Session Equipment Utilized During Treatment: Cervical collar Activity Tolerance: Patient tolerated treatment well Patient left: in bed;with call bell/phone within reach Nurse Communication: Mobility status    Functional Assessment Tool Used: Clinical observation Functional Limitation: Mobility: Walking and moving around Mobility: Walking and Moving Around Current Status ([id number]): At least 20 percent but less than 40 percent impaired, limited or restricted Mobility: Walking and Moving Around Goal Status [phone]): At least 20 percent but less than 40 percent impaired, limited or restricted Mobility: Walking and Moving Around Discharge Status [phone]): At least 20 percent but less than 40 percent impaired, limited or restricted    Time: 0937-1014 PT Time Calculation (min): 37 min   Charges:   PT Evaluation $Initial PT Evaluation Tier I: 1 Procedure PT Treatments $Gait Training: 8-22 mins $Self Care/Home Management: 8-22   PT G Codes:   Functional Assessment Tool Used: Clinical observation Functional Limitation: Mobility: Walking and moving around   [place], [place]  Ellouise Newer 05/07/2014, 10:49 AM

## 2014-05-07 NOTE — Progress Notes (Signed)
Utilization Review Completed.Donne Anon T9/16/2015

## 2014-05-08 ENCOUNTER — Encounter (HOSPITAL_COMMUNITY): Payer: Self-pay | Admitting: Specialist

## 2014-05-08 DIAGNOSIS — R0902 Hypoxemia: Secondary | ICD-10-CM | POA: Diagnosis not present

## 2014-05-08 DIAGNOSIS — A419 Sepsis, unspecified organism: Secondary | ICD-10-CM | POA: Diagnosis not present

## 2014-05-08 DIAGNOSIS — I319 Disease of pericardium, unspecified: Secondary | ICD-10-CM | POA: Diagnosis not present

## 2014-05-08 DIAGNOSIS — R6520 Severe sepsis without septic shock: Secondary | ICD-10-CM | POA: Diagnosis not present

## 2014-05-08 DIAGNOSIS — I517 Cardiomegaly: Secondary | ICD-10-CM | POA: Diagnosis not present

## 2014-05-08 DIAGNOSIS — J189 Pneumonia, unspecified organism: Secondary | ICD-10-CM | POA: Diagnosis not present

## 2014-05-08 DIAGNOSIS — R918 Other nonspecific abnormal finding of lung field: Secondary | ICD-10-CM | POA: Diagnosis not present

## 2014-05-08 DIAGNOSIS — R0602 Shortness of breath: Secondary | ICD-10-CM | POA: Diagnosis not present

## 2014-05-08 DIAGNOSIS — I2510 Atherosclerotic heart disease of native coronary artery without angina pectoris: Secondary | ICD-10-CM | POA: Diagnosis not present

## 2014-05-08 NOTE — Plan of Care (Signed)
Name: Georgia Dom PCP: [name], MD Date: 05/08/2014 MRN: [id number]  Brief HPI: Ms. Mallette is a 68 year old female with history of breast cancer, hypertension, hypothyroidism, chronic kidney disease, hepatitis and arthritis who underwent anterior cervical discectomy fusion C3-4 on 05/06/2014 and subsequently discharged yesterday 05/07/2014 with soft cervical collar.  Presented to [facility] with cough and shortness of breath.  Imaging showed left lower lobe pneumonia, she also had a CT which per ED physician was negative for PE.  She was slightly hypoxic which improved on 2 L of oxygen.  Vitals: 98.4, 93, 18, 122/60, 97% on 2 L  Labs showed a white count of 20.8, otherwise electrolytes normal.  Bed request: Medical bed, inpatient, [facility].  Breia Ocampo A, MD 05/08/2014, 11:54 PM

## 2014-05-08 NOTE — Discharge Summary (Signed)
Patient d/c summary note and lab reviewed.  

## 2014-05-08 NOTE — Progress Notes (Signed)
Patient examined and lab reviewed with Vernon PA-C. 

## 2014-05-09 ENCOUNTER — Inpatient Hospital Stay (HOSPITAL_COMMUNITY)
Admission: EM | Admit: 2014-05-09 | Discharge: 2014-05-13 | DRG: 193 | Disposition: A | Discharge status: Home / Self Care | Payer: Medicare Other | Source: Other Acute Inpatient Hospital | Attending: Internal Medicine | Admitting: Internal Medicine

## 2014-05-09 ENCOUNTER — Encounter (HOSPITAL_COMMUNITY): Payer: Self-pay | Admitting: Internal Medicine

## 2014-05-09 DIAGNOSIS — Z87891 Personal history of nicotine dependence: Secondary | ICD-10-CM

## 2014-05-09 DIAGNOSIS — I10 Essential (primary) hypertension: Secondary | ICD-10-CM | POA: Diagnosis not present

## 2014-05-09 DIAGNOSIS — Z923 Personal history of irradiation: Secondary | ICD-10-CM

## 2014-05-09 DIAGNOSIS — Z981 Arthrodesis status: Secondary | ICD-10-CM | POA: Diagnosis not present

## 2014-05-09 DIAGNOSIS — N882 Stricture and stenosis of cervix uteri: Secondary | ICD-10-CM

## 2014-05-09 DIAGNOSIS — M5020 Other cervical disc displacement, unspecified cervical region: Secondary | ICD-10-CM

## 2014-05-09 DIAGNOSIS — Z9221 Personal history of antineoplastic chemotherapy: Secondary | ICD-10-CM | POA: Diagnosis not present

## 2014-05-09 DIAGNOSIS — Z853 Personal history of malignant neoplasm of breast: Secondary | ICD-10-CM

## 2014-05-09 DIAGNOSIS — M129 Arthropathy, unspecified: Secondary | ICD-10-CM | POA: Diagnosis present

## 2014-05-09 DIAGNOSIS — J9601 Acute respiratory failure with hypoxia: Secondary | ICD-10-CM | POA: Diagnosis present

## 2014-05-09 DIAGNOSIS — M47812 Spondylosis without myelopathy or radiculopathy, cervical region: Secondary | ICD-10-CM

## 2014-05-09 DIAGNOSIS — I509 Heart failure, unspecified: Secondary | ICD-10-CM | POA: Diagnosis present

## 2014-05-09 DIAGNOSIS — J9600 Acute respiratory failure, unspecified whether with hypoxia or hypercapnia: Secondary | ICD-10-CM | POA: Diagnosis present

## 2014-05-09 DIAGNOSIS — J189 Pneumonia, unspecified organism: Principal | ICD-10-CM | POA: Diagnosis present

## 2014-05-09 DIAGNOSIS — R509 Fever, unspecified: Secondary | ICD-10-CM | POA: Diagnosis not present

## 2014-05-09 DIAGNOSIS — E039 Hypothyroidism, unspecified: Secondary | ICD-10-CM | POA: Diagnosis not present

## 2014-05-09 DIAGNOSIS — M4802 Spinal stenosis, cervical region: Secondary | ICD-10-CM | POA: Diagnosis present

## 2014-05-09 DIAGNOSIS — C50919 Malignant neoplasm of unspecified site of unspecified female breast: Secondary | ICD-10-CM | POA: Diagnosis present

## 2014-05-09 DIAGNOSIS — Z79899 Other long term (current) drug therapy: Secondary | ICD-10-CM | POA: Diagnosis not present

## 2014-05-09 HISTORY — DX: Pneumonia, unspecified organism: J18.9

## 2014-05-09 HISTORY — DX: Acute respiratory failure with hypoxia: J96.01

## 2014-05-09 LAB — COMPREHENSIVE METABOLIC PANEL
ALT: 16 U/L (ref 0–35)
AST: 29 U/L (ref 0–37)
Albumin: 3.3 g/dL — ABNORMAL LOW (ref 3.5–5.2)
Alkaline Phosphatase: 64 U/L (ref 39–117)
Anion gap: 13 (ref 5–15)
BUN: 7 mg/dL (ref 6–23)
CO2: 28 mEq/L (ref 19–32)
Calcium: 8.7 mg/dL (ref 8.4–10.5)
Chloride: 99 mEq/L (ref 96–112)
Creatinine, Ser: 0.65 mg/dL (ref 0.50–1.10)
GFR calc Af Amer: >90 mL/min (ref >90)
GFR calc non Af Amer: 88 mL/min — ABNORMAL LOW (ref >90)
Glucose, Bld: 94 mg/dL (ref 70–99)
Potassium: 3.8 mEq/L (ref 3.7–5.3)
Sodium: 140 mEq/L (ref 137–147)
Total Bilirubin: 0.4 mg/dL (ref 0.3–1.2)
Total Protein: 6.6 g/dL (ref 6.0–8.3)

## 2014-05-09 LAB — EXPECTORATED SPUTUM ASSESSMENT W GRAM STAIN, RFLX TO RESP C

## 2014-05-09 LAB — CBC
HCT: 35.5 % — ABNORMAL LOW (ref 36.0–46.0)
Hemoglobin: 12 g/dL (ref 12.0–15.0)
MCH: 30.7 pg (ref 26.0–34.0)
MCHC: 33.8 g/dL (ref 30.0–36.0)
MCV: 90.8 fL (ref 78.0–100.0)
Platelets: 151 10*3/uL (ref 150–400)
RBC: 3.91 MIL/uL (ref 3.87–5.11)
RDW: 13.4 % (ref 11.5–15.5)
WBC: 13.6 10*3/uL — ABNORMAL HIGH (ref 4.0–10.5)

## 2014-05-09 LAB — HIV ANTIBODY (ROUTINE TESTING W REFLEX): HIV 1&2 Ab, 4th Generation: NONREACTIVE

## 2014-05-09 LAB — PROTIME-INR
INR: 1.26 (ref 0.00–1.49)
Prothrombin Time: 15.8 seconds — ABNORMAL HIGH (ref 11.6–15.2)

## 2014-05-09 LAB — STREP PNEUMONIAE URINARY ANTIGEN: Strep Pneumo Urinary Antigen: NEGATIVE

## 2014-05-09 MED ORDER — GABAPENTIN 600 MG PO TABS
600.0000 mg | ORAL_TABLET | Freq: Three times a day (TID) | ORAL | Status: DC
  Administered 2014-05-09 – 2014-05-13 (×13): 600 mg via ORAL
  Filled 2014-05-09 (×16): qty 1

## 2014-05-09 MED ORDER — IPRATROPIUM-ALBUTEROL 0.5-2.5 (3) MG/3ML IN SOLN
3.0000 mL | RESPIRATORY_TRACT | Status: DC | PRN
  Administered 2014-05-09 – 2014-05-11 (×10): 3 mL via RESPIRATORY_TRACT
  Filled 2014-05-09 (×10): qty 3

## 2014-05-09 MED ORDER — LEVOTHYROXINE SODIUM 50 MCG PO TABS
50.0000 ug | ORAL_TABLET | Freq: Every day | ORAL | Status: DC
  Administered 2014-05-09 – 2014-05-13 (×5): 50 ug via ORAL
  Filled 2014-05-09 (×6): qty 1

## 2014-05-09 MED ORDER — METHOCARBAMOL 500 MG PO TABS
500.0000 mg | ORAL_TABLET | Freq: Four times a day (QID) | ORAL | Status: DC
  Administered 2014-05-09 – 2014-05-13 (×16): 500 mg via ORAL
  Filled 2014-05-09 (×25): qty 1

## 2014-05-09 MED ORDER — CELECOXIB 200 MG PO CAPS
200.0000 mg | ORAL_CAPSULE | Freq: Every day | ORAL | Status: DC
  Filled 2014-05-09: qty 1

## 2014-05-09 MED ORDER — SENNOSIDES-DOCUSATE SODIUM 8.6-50 MG PO TABS
1.0000 | ORAL_TABLET | Freq: Two times a day (BID) | ORAL | Status: DC
  Administered 2014-05-09 – 2014-05-13 (×9): 1 via ORAL
  Filled 2014-05-09 (×13): qty 1

## 2014-05-09 MED ORDER — ATENOLOL 12.5 MG HALF TABLET: 12.5000 mg | ORAL_TABLET | Freq: Every day | ORAL | Status: DC | PRN

## 2014-05-09 MED ORDER — MUPIROCIN 2 % EX OINT
1.0000 "application " | TOPICAL_OINTMENT | Freq: Two times a day (BID) | CUTANEOUS | Status: DC
  Administered 2014-05-09: 1 via NASAL
  Filled 2014-05-09: qty 22

## 2014-05-09 MED ORDER — VANCOMYCIN HCL 500 MG IV SOLR
500.0000 mg | Freq: Two times a day (BID) | INTRAVENOUS | Status: DC
  Administered 2014-05-09 – 2014-05-13 (×9): 500 mg via INTRAVENOUS
  Filled 2014-05-09 (×12): qty 500

## 2014-05-09 MED ORDER — DIAZEPAM 5 MG PO TABS
10.0000 mg | ORAL_TABLET | Freq: Every day | ORAL | Status: DC
  Administered 2014-05-09 – 2014-05-12 (×4): 10 mg via ORAL
  Filled 2014-05-09 (×4): qty 2

## 2014-05-09 MED ORDER — GABAPENTIN 600 MG PO TABS
1200.0000 mg | ORAL_TABLET | Freq: Every day | ORAL | Status: DC
  Administered 2014-05-09 – 2014-05-12 (×4): 1200 mg via ORAL
  Filled 2014-05-09 (×6): qty 2

## 2014-05-09 MED ORDER — OXYCODONE-ACETAMINOPHEN 5-325 MG PO TABS
1.0000 | ORAL_TABLET | ORAL | Status: DC | PRN
  Administered 2014-05-09 – 2014-05-11 (×8): 1 via ORAL
  Filled 2014-05-09 (×6): qty 1
  Filled 2014-05-09: qty 2
  Filled 2014-05-09 (×2): qty 1

## 2014-05-09 MED ORDER — DOCUSATE SODIUM 100 MG PO CAPS: 100.0000 mg | ORAL_CAPSULE | Freq: Two times a day (BID) | ORAL | Status: DC | PRN

## 2014-05-09 MED ORDER — CHLORHEXIDINE GLUCONATE CLOTH 2 % EX PADS
6.0000 | MEDICATED_PAD | Freq: Every day | CUTANEOUS | Status: DC
  Administered 2014-05-09: 6 via TOPICAL

## 2014-05-09 MED ORDER — PROMETHAZINE HCL 25 MG PO TABS: 25.0000 mg | ORAL_TABLET | Freq: Four times a day (QID) | ORAL | Status: DC | PRN

## 2014-05-09 MED ORDER — VITAMIN D3 25 MCG (1000 UNIT) PO TABS
1000.0000 [IU] | ORAL_TABLET | Freq: Every day | ORAL | Status: DC
  Administered 2014-05-09 – 2014-05-13 (×5): 1000 [IU] via ORAL
  Filled 2014-05-09 (×5): qty 1

## 2014-05-09 MED ORDER — ADULT MULTIVITAMIN W/MINERALS CH
1.0000 | ORAL_TABLET | Freq: Every day | ORAL | Status: DC
  Administered 2014-05-09 – 2014-05-12 (×4): 1 via ORAL
  Filled 2014-05-09 (×4): qty 1

## 2014-05-09 MED ORDER — DEXTROSE 5 % IV SOLN
1.0000 g | Freq: Three times a day (TID) | INTRAVENOUS | Status: DC
  Administered 2014-05-09 – 2014-05-12 (×12): 1 g via INTRAVENOUS
  Administered 2014-05-13: 06:00:00 via INTRAVENOUS
  Filled 2014-05-09 (×17): qty 1

## 2014-05-09 MED ORDER — PANTOPRAZOLE SODIUM 40 MG PO TBEC
40.0000 mg | DELAYED_RELEASE_TABLET | Freq: Every day | ORAL | Status: DC
  Administered 2014-05-09 – 2014-05-13 (×5): 40 mg via ORAL
  Filled 2014-05-09 (×4): qty 1

## 2014-05-09 MED ORDER — ENOXAPARIN SODIUM 40 MG/0.4ML ~~LOC~~ SOLN
40.0000 mg | SUBCUTANEOUS | Status: DC
  Filled 2014-05-09: qty 0.4

## 2014-05-09 MED ORDER — ASPIRIN 325 MG PO TABS
325.0000 mg | ORAL_TABLET | Freq: Two times a day (BID) | ORAL | Status: DC
  Administered 2014-05-09 – 2014-05-13 (×9): 325 mg via ORAL
  Filled 2014-05-09 (×10): qty 1

## 2014-05-09 NOTE — Progress Notes (Addendum)
[facility] PROGRESS NOTE  TSURUKO MURTHA QVZ:[id number] DOB: 13-May-1946 DOA: 05/09/2014 PCP: [name], MD  Assessment/Plan: 68 y.o. 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   1. HCAP; CT: BL pneumonia; -cont IV atx, f/u cultures; IVF as needed  2. Acute respiratory failure with hypoxia due to healthcare associated pneumonia  -hypoxia resolved with oxygen, Rx pneumonia, inhalers, NiPPV prn  3. Cervical spinal stenosis status post anterior fusion on 05/06/2014  -per neurosurgery   4. Hypothyroidism Continue Synthroid.  5. History of breast cancer  -Resume Herceptin after patient recovers from acute illness.  6. Patient denies having HTN; she also reports recent echo which was normal;     DVT Prophylaxis; d/w patient, neurosurgery at length; Pt had h/o spinal hematoma due to anticoagulation lovenox;  -due to high risk of re bleeding neurosurgery recommended ASA 325 BID, added PPI for GI prophylaxis; cotn SCD, ambulate   Code Status: full Family Communication: d/w patient  (indicate person spoken with, relationship, and if by phone, the number) Disposition Plan: home pend clinical improvement    Consultants:  Neurosurgery   Procedures:  none  Antibiotics:  vanc 9/18<<<<  Cefepime 9/18<<<   (indicate start date, and stop date if known)  HPI/Subjective: alert  Objective: Filed Vitals:   05/09/14 0523  BP: 115/48  Pulse: 94  Temp: 98.6 F (37 C)  Resp: 17    Intake/Output Summary (Last 24 hours) at 05/09/14 0938 Last data filed  at 05/09/14 0253  Gross per 24 hour  Intake    240 ml  Output    500 ml  Net   -260 ml   Filed Weights   05/09/14 0225  Weight: 69.1 kg (152 lb 5.4 oz)    Exam:   General:  alert  Cardiovascular: s1,s2 rrr  Respiratory: BL rhonchi   Abdomen: soft, nt,nd   Musculoskeletal: no LE edema   Data Reviewed: Basic Metabolic Panel:  Recent Labs Lab 05/09/14 0312  NA 140  K 3.8  CL 99  CO2 28  GLUCOSE 94  BUN 7  CREATININE 0.65  CALCIUM 8.7   Liver Function Tests:  Recent Labs Lab 05/09/14 0312  AST 29  ALT 16  ALKPHOS 64  BILITOT 0.4  PROT 6.6  ALBUMIN 3.3*   No results found for this basename: LIPASE, AMYLASE,  in the last 168 hours No results found for this basename: AMMONIA,  in the last 168 hours CBC:  Recent Labs Lab 05/09/14 0312  WBC 13.6*  HGB 12.0  HCT 35.5*  MCV 90.8  PLT 151   Cardiac Enzymes: No results found for this basename: CKTOTAL, CKMB, CKMBINDEX, TROPONINI,  in the last 168 hours BNP (last 3 results) No results found for this basename: PROBNP,  in the last 8760 hours CBG: No results found for this basename: GLUCAP,  in the last 168 hours  Recent Results (from the past 240 hour(s))  CULTURE, EXPECTORATED SPUTUM-ASSESSMENT     Status: None   Collection Time    05/09/14  3:10 AM      Result Value Ref Range Status   Specimen Description SPUTUM   Final  Special Requests NONE   Final   Sputum evaluation     Final   Value: THIS SPECIMEN IS ACCEPTABLE. RESPIRATORY CULTURE REPORT TO FOLLOW.   Report Status 05/09/2014 FINAL   Final     Studies: No results found.  Scheduled Meds: . aspirin  325 mg Oral BID  . ceFEPime (MAXIPIME) IV  1 g Intravenous 3 times per day  . Chlorhexidine Gluconate Cloth  6 each Topical Q0600  . cholecalciferol  1,000 Units Oral Daily  . diazepam  10 mg Oral QHS  . gabapentin  1,200 mg Oral QHS  . gabapentin  600 mg Oral TID PC  . levothyroxine  50 mcg Oral QAC breakfast  . methocarbamol  500 mg  Oral QID  . multivitamin with minerals  1 tablet Oral Daily  . mupirocin ointment  1 application Nasal BID  . pantoprazole  40 mg Oral Daily  . vancomycin  500 mg Intravenous Q12H   Continuous Infusions:   Principal Problem:   Acute respiratory failure with hypoxia Active Problems:   Spinal stenosis in cervical region   HCAP (healthcare-associated pneumonia)   Hypertension   Hypothyroidism   Breast cancer   Pneumonia    Time spent: >35 minutes     Kinnie Feil  [facility] Pager [phone]. If 7PM-7AM, please contact night-coverage at [email or web address], password [email or web address] 05/09/2014, 9:38 AM  LOS: 0 days

## 2014-05-09 NOTE — Progress Notes (Signed)
Utilization review completed.  

## 2014-05-09 NOTE — Evaluation (Signed)
Physical Therapy Evaluation Patient Details Name: Karen Allison MRN: [id number] DOB: 06-05-1946 Today's Date: 05/09/2014   History of Present Illness  Patient is a 68 y/o female with recent discharge on 9/16 s/p ACDF C3-4 presents with worsening SOB, cough and fever of 101 during the night with chills. Pt admitted with acute respiratory failure with hypoxia due to healthcare associated pneumonia.   Clinical Impression  Patient presents with functional limitations due to deficits listed in PT problem list (see below). Pt with balance deficits, generalized weakness/fatigue and impulsivity limiting safe mobility. Pt requires constant cues for safety throughout evaluation. 1 LOB without use of RW in bathroom. Reviewed cervical precautions and importance of sitting up in chair to improve ventilation. Pt would benefit from acute PT to improve transfers, gait, balance and overall safe mobility so pt can maximize independence and return to PLOF. Pt may need HHPT vs OPPT (neuro rehab) pending progress in therapy in acute setting.    Follow Up Recommendations Supervision/Assistance - 24 hour;Home health PT (vs OPPT pending progress.)    Equipment Recommendations  None recommended by PT    Recommendations for Other Services OT consult     Precautions / Restrictions Precautions Precautions: Cervical Precaution Comments: Reviewed cervical precautions Required Braces or Orthoses: Cervical Brace Cervical Brace: Soft collar;At all times      Mobility  Bed Mobility Overal bed mobility: Modified Independent             General bed mobility comments: HOB elevated, used rails; has adjustable bed with rail at home  Transfers Overall transfer level: Needs assistance Equipment used: Rolling walker (2 wheeled) Transfers: Sit to/from Stand Sit to Stand: Min guard         General transfer comment: VC for hand placement and RW safety as pt with tendency to push RW out of the way prior to  finding surface to sit on. Ambulated to bathroom and left RW outside door, impulsive and poor safety awareness.  Ambulation/Gait Ambulation/Gait assistance: Min assist Ambulation Distance (Feet): 75 Feet Assistive device: Rolling walker (2 wheeled) Gait Pattern/deviations: Step-through pattern;Decreased stride length;Trendelenburg;Narrow base of support     General Gait Details: VC for RW proximity/safety. Unsteady and impulsive today during gait training with VC to slow down. No overt LOB during gait however in bathroom during turning pt with LOB into wall.  Stairs            Wheelchair Mobility    Modified Rankin (Stroke Patients Only)       Balance Overall balance assessment: Needs assistance   Sitting balance-Leahy Scale: Fair     Standing balance support: During functional activity Standing balance-Leahy Scale: Poor Standing balance comment: Requires UE support during pericare and pulling up underwear as pt with LOB in bathroom without UE support into wall with therapist providing Min A to prevent fall. Requires UE support for stability. Able to wash hands without LOB reaching outside BoS. Unpredictable and impulsive.                              Pertinent Vitals/Pain Pain Assessment: No/denies pain    Home Living Family/patient expects to be discharged to:: Private residence Living Arrangements: Spouse/significant other Available Help at Discharge: Family;Available 24 hours/day Type of Home: House Home Access: Stairs to enter Entrance Stairs-Rails: Left Entrance Stairs-Number of Steps: 2 Home Layout: Two level;Able to live on main level with bedroom/bathroom Home Equipment: Walker - 4 wheels;Walker - 2  wheels;Cane - quad;Bedside commode;Shower seat;Shower seat - built Investment banker, operational      Prior Function Level of Independence: Independent with assistive device(s)         Comments: rollator for ambulation     Hand Dominance    Dominant Hand: Right    Extremity/Trunk Assessment   Upper Extremity Assessment: Overall WFL for tasks assessed           Lower Extremity Assessment: Generalized weakness      Cervical / Trunk Assessment: Other exceptions  Communication   Communication: No difficulties  Cognition Arousal/Alertness: Awake/alert Behavior During Therapy: Impulsive Overall Cognitive Status: Within Functional Limits for tasks assessed                      General Comments General comments (skin integrity, edema, etc.): Pt removed 02 prior to ambulation however not able to obtain accurate Sa02 post ambulation to bathroom and pt reporting dizziness. Donned 02 for gait training, Sa02 remained in mid 90s.    Exercises        Assessment/Plan    PT Assessment Patient needs continued PT services  PT Diagnosis Generalized weakness;Abnormality of gait   PT Problem List Decreased strength;Cardiopulmonary status limiting activity;Decreased cognition;Decreased activity tolerance;Decreased balance;Decreased safety awareness;Decreased mobility;Decreased knowledge of precautions;Decreased knowledge of use of DME  PT Treatment Interventions DME instruction;Balance training;Gait training;Cognitive remediation;Stair training;Patient/family education;Functional mobility training;Therapeutic activities;Therapeutic exercise   PT Goals (Current goals can be found in the Care Plan section) Acute Rehab PT Goals Patient Stated Goal: to feel better PT Goal Formulation: With patient Time For Goal Achievement: 05/23/14 Potential to Achieve Goals: Good    Frequency Min 3X/week   Barriers to discharge        Co-evaluation               End of Session Equipment Utilized During Treatment: Cervical collar;Gait belt Activity Tolerance: Patient limited by fatigue Patient left: in bed;with call bell/phone within reach;with bed alarm set;with family/visitor present Nurse Communication: Mobility status          Time: 1640-1706 PT Time Calculation (min): 26 min   Charges:   PT Evaluation $Initial PT Evaluation Tier I: 1 Procedure PT Treatments $Gait Training: 8-22 mins   PT G CodesCandy Sledge A 05/09/2014, 5:20 PM Candy Sledge, [place], DPT [phone]

## 2014-05-09 NOTE — H&P (Addendum)
Patient's PCP: [name], MD Patient's orthopedic physician: Dr. Louanne Skye  Chief Complaint: Cough and shortness of breath  History of Present Illness: Karen Allison is a 68 y.o. 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  Patient was hospitalized for surgery on 05/06/2014 and was discharged on 05/07/2014.  Subsequently after discharge patient noted that at 3 p.m. the same day she started coughing.  She also had a fever of 101 during the night with chills.  She contacted her orthopedic physician and was given azithromycin.  She also checked her oxygenation at home which was 85% on room air and subsequently used her daughter's oxygen.  Due to ongoing shortness of breath, she initially presented to urgent care and was eventually transferred to [facility] emergency department for further evaluation.  Due to her surgery at [facility], hospitalist service was asked to admit the patient for further care and management.  Patient denies any nausea, vomiting, chest pain, abdominal pain, diarrhea, headaches or vision changes.  She indicates that her breathing has significantly improved.  Review of Systems: All systems reviewed with the patient and positive as per history of present illness, otherwise all other systems are negative.  Past Medical History  Diagnosis Date  . Breast cancer   . Hypertension   . Pneumonia   . Hypothyroidism   . Arthritis   . Blood dyscrasia     bleeds and bruises easily  . Hepatitis      Hep A years ago  . Nose trouble     right nostril will hemorrhage at times   Past Surgical History  Procedure Laterality Date  . Lumpectomy    . Appendectomy    . Tonsillectomy    . Thyroidectomy    . Back surgery    . Abdominal hysterectomy    . Eye surgery  Bilateral     cataracts  . Colonoscopy    . Orif humerus fracture Left 2013  . Anterior cervical decomp/discectomy fusion N/A 05/06/2014    Procedure: ANTERIOR CERVICAL DISCECTOMY FUSION C3-4 with transgraft, local bone graft, plate and screws;  Surgeon: Jessy Oto, MD;  Location: [facility];  Service: Orthopedics;  Laterality: N/A;   Family History  Problem Relation Age of Onset  . Congestive Heart Failure Mother   . Heart disease Father    History   Social History  . Marital Status: Married    Spouse Name: N/A    Number of Children: N/A  . Years of Education: N/A   Occupational History  . Not on file.   Social History Main Topics  . Smoking status: Former Smoker    Types: Cigarettes    Quit date: 04/23/1962  . Smokeless tobacco: Never Used     Comment: as teenager  . Alcohol Use: No  . Drug Use: No  . Sexual Activity: Not on file   Other Topics Concern  . Not on file   Social History Narrative  . No narrative on file   Allergies: Review of patient's allergies indicates no known allergies.  Home Meds: Prior to Admission medications   Medication Sig Start Date End Date Taking? Authorizing Provider  atenolol (TENORMIN) 50 MG tablet Take 12.5 mg by mouth daily as needed (palpitations).  01/19/13   Historical Provider, MD  Calcium Carbonate-Vitamin D (CALTRATE 600+D PO) Take 1 tablet by mouth 2 (  two) times daily.    Historical Provider, MD  celecoxib (CELEBREX) 200 MG capsule Take 200 mg by mouth daily.  12/31/10   Historical Provider, MD  cholecalciferol (VITAMIN D) 1000 UNITS tablet Take 1,000 Units by mouth daily.    Historical Provider, MD  conjugated estrogens (PREMARIN) vaginal cream Place 0.5 Applicatorfuls vaginally 2 (two) times a week. 1/2 application vaginally two times a week. 01/19/10   Historical Provider, MD  diazepam (VALIUM) 10 MG tablet Take 10 mg by mouth at bedtime.     Historical Provider, MD  docusate sodium (COLACE) 100 MG capsule Take 100 mg by mouth  2 (two) times daily as needed for mild constipation.    Historical Provider, MD  gabapentin (NEURONTIN) 600 MG tablet Take 600-1,200 mg by mouth 4 (four) times daily - after meals and at bedtime. 1 tablet (600 mg) 3 times a day and 2 tablets(1200mg ) at bedtime    Historical Provider, MD  HYDROcodone-acetaminophen (NORCO) 7.5-325 MG per tablet Take 1 tablet by mouth every 6 (six) hours as needed (pain). For pain    Historical Provider, MD  levothyroxine (SYNTHROID, LEVOTHROID) 50 MCG tablet Take 50 mcg by mouth daily.    Historical Provider, MD  methocarbamol (ROBAXIN) 500 MG tablet Take 500 mg by mouth 4 (four) times daily.    Historical Provider, MD  Multiple Vitamin (MULTIVITAMIN WITH MINERALS) TABS Take 1 tablet by mouth daily.    Historical Provider, MD  oxyCODONE-acetaminophen (ROXICET) 5-325 MG per tablet Take 1-2 tablets by mouth every 4 (four) hours as needed for severe pain. 05/06/14   Epimenio Foot, PA-C  promethazine (PHENERGAN) 25 MG tablet Take 25 mg by mouth every 6 (six) hours as needed for nausea or vomiting.    Historical Provider, MD  trastuzumab (HERCEPTIN) 440 MG injection 440 mg by Intravenous (Continuous Infusion) route every 21 ( twenty-one) days. infusion every 3 weeks 03/19/09   Historical Provider, MD  zolendronic acid (ZOMETA) 4 MG/5ML injection Inject 4 mg into the vein every 6 (six) months.     Historical Provider, MD    Physical Exam: Blood pressure 96/55, pulse 83, temperature 98.2 F (36.8 C), temperature source Oral, resp. rate 17, height 5' 1.5" (1.562 m), weight 69.1 kg (152 lb 5.4 oz), SpO2 99.00%. General: Awake, Oriented x3, No acute distress. HEENT: EOMI, Moist mucous membranes Neck: Supple, surgical wound covered in bandage, soft cervical collar in place. CV: S1 and S2 Lungs: Scattered wheezing, crackles to mid way lungs bilaterally worse on the left. Abdomen: Soft, Nontender, Nondistended, +bowel sounds. Ext: Good pulses.  1+ lower extremity edema  bilaterally. No clubbing or cyanosis noted. Neuro: Cranial Nerves II-XII grossly intact. Has 5/5 motor strength in upper and lower extremities.  Lab results: Labs from [facility]: WBC 20.8, hemoglobin 12.7, hematocrit 38.6, platelet count 151, ABG pH 7.43, PCO2 41, PO2 49, on 21% FiO2, sodium 137, potassium 3.6, chloride 97, CO2 28, glucose 118, BUN 7, creatinine 0.6.  Imaging results:  Dg Chest 2 View  04/23/2014   CLINICAL DATA:  68 year old female preoperative study for cervical spine surgery. Initial encounter.  EXAM: CHEST  2 VIEW  COMPARISON:  04/10/2007.  FINDINGS: Interval lumbar fusion hardware placement. Stable lung volumes, mild eventration of the right hemidiaphragm. Stable cardiac size at the upper limits of normal. Other mediastinal contours are within normal limits. Visualized tracheal air column is within normal limits. Chronic postoperative changes to the left breast. No pneumothorax, pulmonary edema, pleural effusion or acute pulmonary opacity.  Very advanced and progressed degenerative changes at the left glenohumeral joint.  IMPRESSION: No acute cardiopulmonary abnormality.   Electronically Signed   By: Lars Pinks M.D.   On: 04/23/2014 14:45   Dg Cervical Spine 2-3 Views  05/06/2014   CLINICAL DATA:  Anterior cervical disc fusion of C3-4.  EXAM: DG C-ARM 61-120 MIN; CERVICAL SPINE - 2-3 VIEW  COMPARISON:  MRI of June 21, 2013.  FINDINGS: Two intraoperative fluoroscopic images of the cervical spine were submitted for review. These demonstrate the patient to be status post surgical anterior fusion of C3-4. Good alignment of vertebral bodies is noted.  IMPRESSION: Status post surgical anterior fusion of C3-4.   Electronically Signed   By: Sabino Dick M.D.   On: 05/06/2014 10:16   Dg C-arm 1-60 Min  05/06/2014   CLINICAL DATA:  Anterior cervical disc fusion of C3-4.  EXAM: DG C-ARM 61-120 MIN; CERVICAL SPINE - 2-3 VIEW  COMPARISON:  MRI of June 21, 2013.  FINDINGS: Two  intraoperative fluoroscopic images of the cervical spine were submitted for review. These demonstrate the patient to be status post surgical anterior fusion of C3-4. Good alignment of vertebral bodies is noted.  IMPRESSION: Status post surgical anterior fusion of C3-4.   Electronically Signed   By: Sabino Dick M.D.   On: 05/06/2014 10:16   CT angiography of the chest with contrast at [facility] on 05/08/2014 No pulmonary emboli, patchy bilateral groundglass opacities greatest in the left lower lobe suspicious for aspiration or infection.  Assessment & Plan by Problem: Acute respiratory failure with hypoxia due to healthcare associated pneumonia Given patient's recent surgery and hospitalization will treat as healthcare associated pneumonia.  Start patient on vancomycin and cefepime.  Sent for blood cultures x2.  Send sputum culture.  Send for urine Legionella and strep pneumo.  Cervical spinal stenosis status post anterior fusion on 05/06/2014 Patient's orthopedic physician will need to be notified in the morning.  Wound covered in bandages.  Patient to wear soft cervical collar at all times, except philadelphia collar for use in shower.  Hypertension Continue atenolol with hold parameters.  Hypothyroidism Continue Synthroid.  History of breast cancer Resume Herceptin after patient recovers from acute illness.  Prophylaxis Lovenox.  CODE STATUS Full code.  Disposition Admit the patient to medical bed as inpatient.  Time spent on admission, talking to the patient, and coordinating care was: 50 mins.  Vickki Igou A, MD 05/09/2014, 2:40 AM

## 2014-05-09 NOTE — Progress Notes (Signed)
ANTIBIOTIC CONSULT NOTE - INITIAL  Pharmacy Consult for Vancomycin  Indication: rule out pneumonia  No Known Allergies  Patient Measurements: Height: 5' 1.5" (156.2 cm) Weight: 152 lb 5.4 oz (69.1 kg) IBW/kg (Calculated) : 48.95  Vital Signs: Temp: 98.6 F (37 C) (09/18 0523) Temp src: Oral (09/18 0523) BP: 115/48 mmHg (09/18 0523) Pulse Rate: 94 (09/18 0523)  Labs:  Recent Labs  05/09/14 0312  WBC 13.6*  HGB 12.0  PLT 151  CREATININE 0.65   Estimated Creatinine Clearance: 58.9 ml/min (by C-G formula based on Cr of 0.65).   Microbiology: Recent Results (from the past 720 hour(s))  SURGICAL PCR SCREEN     Status: Abnormal   Collection Time    04/23/14  2:09 PM      Result Value Ref Range Status   MRSA, PCR POSITIVE (*) NEGATIVE Final   Staphylococcus aureus POSITIVE (*) NEGATIVE Final   Comment:            The Xpert SA Assay (FDA     approved for NASAL specimens     in patients over 104 years of age),     is one component of     a comprehensive surveillance     program.  Test performance has     been validated by [employer] for patients greater     than or equal to 68 year old.     It is not intended     to diagnose infection nor to     guide or monitor treatment.  CULTURE, EXPECTORATED SPUTUM-ASSESSMENT     Status: None   Collection Time    05/09/14  3:10 AM      Result Value Ref Range Status   Specimen Description SPUTUM   Final   Special Requests NONE   Final   Sputum evaluation     Final   Value: THIS SPECIMEN IS ACCEPTABLE. RESPIRATORY CULTURE REPORT TO FOLLOW.   Report Status 05/09/2014 FINAL   Final    Medical History: Past Medical History  Diagnosis Date  . Breast cancer   . Hypertension   . Pneumonia   . Hypothyroidism   . Arthritis   . Blood dyscrasia     bleeds and bruises easily  . Hepatitis      Hep A years ago  . Nose trouble     right nostril will hemorrhage at times    Assessment: Transfer from [facility] with possible  PNA, leukocytosis, renal function ok, other labs as above. No documented antibiotics at [facility].   Goal of Therapy:  Vancomycin trough level 15-20 mcg/ml  Plan:  -Vancomycin 500 mg IV q12h -Cefepime per MD -Trend WBC, temp, renal function  -Drug levels as indicated   Narda Bonds 05/09/2014,5:42 AM

## 2014-05-09 NOTE — Progress Notes (Signed)
Pt admitted to the unit from [facility]. Pt is alert and oriented. Pt oriented to room, staff, and call bell. Educated on fall safety plan. Bed in lowest position. Page to Dr. Reece Levy for notification of arrival. Full assessment to Epic. Call bell with in reach. Educated to call for assists. Will continue to monitor. Mady Gemma, RN

## 2014-05-09 NOTE — Progress Notes (Signed)
Patient ID: Karen Allison, female   DOB: [birth year], 68 y.o.   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  Discharged home on 05/07/2014. Began experiencing congestion and productive cough the afternoon 9/16. Called office and a Zithramycin 5 day pack Rx was called to her pharmacy and she was advised to see her primary care MD for eval and possible CXR. 05/08/2014 called my office and advised that she was having low grade fever 101. Recommended to use incentive spirometry. She told us that her O2 saturations were 85% and she was having difficulty reaching her primary care physicians office, she was advised to go to [facility]. There she was evaluated and transferred to [facility] for further care of respiratory decompensation and likely pneumonia. CT angiogram  last night negative for pulmonary embolism. CT shows opacities in the right lower lobe consistent with pneumonic infiltrate. She does have history of breast ca stage IV with 02/2014 studies at [facility] indicating some pulmonary changes. Not experiencing neck pain or worsening discomfort associated with the left neck incision.   Patient reports pain as mild.    Objective:   VITALS:  Temp:  [98.2 F (36.8 C)-98.6 F (37 C)] 98.6 F (37 C) (09/18 0523) Pulse Rate:  [83-94] 94 (09/18 0523) Resp:  [17] 17 (09/18 0523) BP: (96-115)/(48-55) 115/48 mmHg (09/18 0523) SpO2:  [94 %-99 %] 94 % (09/18 0523) Weight:  [69.1 kg (152 lb 5.4 oz)] 69.1 kg (152 lb 5.4 oz) (09/18 0225)  Neurologically intact ABD soft Neurovascular intact Incision: no drainage No cellulitis present mild swelling left anterior neck, not warm, no erythrema.   LABS  Recent Labs  05/09/14 0312  HGB 12.0  WBC 13.6*  PLT 151    Recent Labs  05/09/14 0312  NA 140  K 3.8  CL 99  CO2 28  BUN 7  CREATININE 0.65  GLUCOSE 94     Recent Labs  05/09/14 0312  INR 1.26     Assessment/Plan:     Perioperative pneumonia, history of MRSA. Recent ACDF, C3-4 for cervical stenosis with myelomalacia mild swelling consistent with recent surgery but is at risk for infection. Breast Ca stage IV post chemotherapy and radiation treatments. CHF history Lumbar degenerative scoliosis with stenosis, post T10-S1 fusion with perioperative hematoma, mild chronic LE radiculopathy.  Plan: Appreciate the excellent care given Karen Allison. Her incision and left neck swelling is consistent with recent surgery. Will Follow while she is undergoing treatment for her pneumonia. Mucinex? Continue ABX therapy due to Post-op infection. If possible consider aspirin and SCDs in order to decrease risk of perioperative bleeding at the cervical spine incision and surgical site.  Colinda Barth E 05/09/2014, 8:18 AM

## 2014-05-10 LAB — LEGIONELLA ANTIGEN, URINE: Legionella Antigen, Urine: NEGATIVE

## 2014-05-10 MED ORDER — SODIUM CHLORIDE 0.9 % IV SOLN
INTRAVENOUS | Status: DC
  Administered 2014-05-10: 50 mL/h via INTRAVENOUS
  Administered 2014-05-11: 07:00:00 via INTRAVENOUS

## 2014-05-10 NOTE — Progress Notes (Signed)
[facility] PROGRESS NOTE  Karen Allison UVO:[id number] DOB: Dec 07, 1945 DOA: 05/09/2014 PCP: [name], MD  Assessment/Plan: 68 y.o. 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   1. HCAP; CT: BL pneumonia; -cont IV atx, f/u cultures; IVF as needed  2. Acute respiratory failure with hypoxia due to healthcare associated pneumonia  -hypoxia resolved with oxygen, Rx pneumonia, inhalers, NiPPV prn; desat study prior to discharge  3. Cervical spinal stenosis status post anterior fusion on 05/06/2014  -per neurosurgery   4. Hypothyroidism Continue Synthroid.  5. History of breast cancer  -Resume Herceptin after patient recovers from acute illness.  6. Patient denies having HTN; she also reports recent echo which was normal;     DVT Prophylaxis; d/w patient, neurosurgery at length; Pt had h/o spinal hematoma due to anticoagulation lovenox;  -due to high risk of re bleeding neurosurgery recommended ASA 325 BID, added PPI for GI prophylaxis; cotn SCD, ambulate   Code Status: full Family Communication: d/w patient, called Karen, [name] [phone] no answer; called updated Karen Allison Daughter [phone]    (indicate person spoken with, relationship, and if by phone, the number) Disposition Plan: home pend clinical improvement    Consultants:  Neurosurgery   Procedures:  none  Antibiotics:  vanc 9/18<<<<  Cefepime 9/18<<<   (indicate start date, and stop date if known)  HPI/Subjective: alert  Objective: Filed  Vitals:   05/10/14 0953  BP: 125/55  Pulse: 89  Temp: 98.2 F (36.8 C)  Resp: 17    Intake/Output Summary (Last 24 hours) at 05/10/14 1125 Last data filed at 05/10/14 1101  Gross per 24 hour  Intake    360 ml  Output    750 ml  Net   -390 ml   Filed Weights   05/09/14 0225 05/09/14 2102  Weight: 69.1 kg (152 lb 5.4 oz) 70.035 kg (154 lb 6.4 oz)    Exam:   General:  alert  Cardiovascular: s1,s2 rrr  Respiratory: BL rhonchi   Abdomen: soft, nt,nd   Musculoskeletal: no LE edema   Data Reviewed: Basic Metabolic Panel:  Recent Labs Lab 05/09/14 0312  NA 140  K 3.8  CL 99  CO2 28  GLUCOSE 94  BUN 7  CREATININE 0.65  CALCIUM 8.7   Liver Function Tests:  Recent Labs Lab 05/09/14 0312  AST 29  ALT 16  ALKPHOS 64  BILITOT 0.4  PROT 6.6  ALBUMIN 3.3*   No results found for this basename: LIPASE, AMYLASE,  in the last 168 hours No results found for this basename: AMMONIA,  in the last 168 hours CBC:  Recent Labs Lab 05/09/14 0312  WBC 13.6*  HGB 12.0  HCT 35.5*  MCV 90.8  PLT 151   Cardiac Enzymes: No results found for this basename: CKTOTAL, CKMB, CKMBINDEX, TROPONINI,  in the last 168 hours BNP (last 3 results) No results found for this basename: PROBNP,  in the last 8760 hours CBG: No results found for this basename: GLUCAP,  in the last 168 hours  Recent Results (from the past 240 hour(s))  CULTURE, EXPECTORATED SPUTUM-ASSESSMENT     Status: None   Collection  Time    05/09/14  3:10 AM      Result Value Ref Range Status   Specimen Description SPUTUM   Final   Special Requests NONE   Final   Sputum evaluation     Final   Value: THIS SPECIMEN IS ACCEPTABLE. RESPIRATORY CULTURE REPORT TO FOLLOW.   Report Status 05/09/2014 FINAL   Final  CULTURE, RESPIRATORY (NON-EXPECTORATED)     Status: None   Collection Time    05/09/14  3:10 AM      Result Value Ref Range Status   Specimen Description SPUTUM   Final   Special Requests NONE   Final    Gram Stain     Final   Value: FEW WBC PRESENT,BOTH PMN AND MONONUCLEAR     NO SQUAMOUS EPITHELIAL CELLS SEEN     NO ORGANISMS SEEN     Performed at [facility]   Culture PENDING   Incomplete   Report Status PENDING   Incomplete     Studies: No results found.  Scheduled Meds: . aspirin  325 mg Oral BID  . ceFEPime (MAXIPIME) IV  1 g Intravenous 3 times per day  . cholecalciferol  1,000 Units Oral Daily  . diazepam  10 mg Oral QHS  . gabapentin  1,200 mg Oral QHS  . gabapentin  600 mg Oral TID PC  . levothyroxine  50 mcg Oral QAC breakfast  . methocarbamol  500 mg Oral QID  . multivitamin with minerals  1 tablet Oral Daily  . pantoprazole  40 mg Oral Daily  . senna-docusate  1 tablet Oral BID  . vancomycin  500 mg Intravenous Q12H   Continuous Infusions:   Principal Problem:   Acute respiratory failure with hypoxia Active Problems:   Spinal stenosis in cervical region   HCAP (healthcare-associated pneumonia)   Hypertension   Hypothyroidism   Breast cancer   Pneumonia    Time spent: >35 minutes     Kinnie Feil  [facility] Pager [phone]. If 7PM-7AM, please contact night-coverage at [email or web address], password [facility] 05/10/2014, 11:25 AM  LOS: 1 day

## 2014-05-11 LAB — CBC
HCT: 32 % — ABNORMAL LOW (ref 36.0–46.0)
Hemoglobin: 10.8 g/dL — ABNORMAL LOW (ref 12.0–15.0)
MCH: 30.6 pg (ref 26.0–34.0)
MCHC: 33.8 g/dL (ref 30.0–36.0)
MCV: 90.7 fL (ref 78.0–100.0)
Platelets: 166 10*3/uL (ref 150–400)
RBC: 3.53 MIL/uL — ABNORMAL LOW (ref 3.87–5.11)
RDW: 13.6 % (ref 11.5–15.5)
WBC: 6.4 10*3/uL (ref 4.0–10.5)

## 2014-05-11 LAB — CULTURE, RESPIRATORY W GRAM STAIN: Culture: NORMAL

## 2014-05-11 MED ORDER — IPRATROPIUM-ALBUTEROL 0.5-2.5 (3) MG/3ML IN SOLN
3.0000 mL | RESPIRATORY_TRACT | Status: DC
  Administered 2014-05-11 – 2014-05-13 (×13): 3 mL via RESPIRATORY_TRACT
  Filled 2014-05-11 (×13): qty 3

## 2014-05-11 NOTE — Progress Notes (Signed)
[facility] PROGRESS NOTE  Karen HOVSEPIAN PYK:[id number] DOB: [birth year] DOA: 05/09/2014 PCP: [name], MD  Assessment/Plan: 68 y.o. 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   1. HCAP; CT: BL pneumonia; -leukocytosis resolved, afebrile; cont IV atx, blood cultures: NGTD; IVF as needed  2. Acute respiratory failure with hypoxia due to healthcare associated pneumonia  -hypoxia resolved with oxygen, Rx pneumonia, inhalers, NiPPV prn; desat study prior to discharge  3. Cervical spinal stenosis status post anterior fusion on 05/06/2014  -per neurosurgery   4. Hypothyroidism Continue Synthroid.  5. History of breast cancer  -Resume Herceptin after patient recovers from acute illness.  6. Patient denies having HTN; she also reports recent echo which was normal;     DVT Prophylaxis; d/w patient, neurosurgery at length; Pt had h/o spinal hematoma due to anticoagulation lovenox;  -due to high risk of re bleeding neurosurgery recommended ASA 325 BID, added PPI for GI prophylaxis; cotn SCD, ambulate   Code Status: full Family Communication: d/w patient, recently called updated [facility] Daughter [phone]; Myli, Pae Spouse [phone] no answer;     (indicate person spoken with, relationship, and if by phone, the number) Disposition Plan: home pend clinical improvement    Consultants:  Neurosurgery   Procedures:  none  Antibiotics:  vanc 9/18<<<<  Cefepime 9/18<<<   (indicate start date, and stop date if  known)  HPI/Subjective: alert  Objective: Filed Vitals:   05/11/14 0858  BP: 132/53  Pulse: 89  Temp: 98.4 F (36.9 C)  Resp: 18    Intake/Output Summary (Last 24 hours) at 05/11/14 1007 Last data filed at 05/11/14 0858  Gross per 24 hour  Intake   2190 ml  Output   5350 ml  Net  -3160 ml   Filed Weights   05/09/14 0225 05/09/14 2102 05/10/14 2024  Weight: 69.1 kg (152 lb 5.4 oz) 70.035 kg (154 lb 6.4 oz) 68.675 kg (151 lb 6.4 oz)    Exam:   General:  alert  Cardiovascular: s1,s2 rrr  Respiratory: BL rhonchi   Abdomen: soft, nt,nd   Musculoskeletal: no LE edema   Data Reviewed: Basic Metabolic Panel:  Recent Labs Lab 05/09/14 0312  NA 140  K 3.8  CL 99  CO2 28  GLUCOSE 94  BUN 7  CREATININE 0.65  CALCIUM 8.7   Liver Function Tests:  Recent Labs Lab 05/09/14 0312  AST 29  ALT 16  ALKPHOS 64  BILITOT 0.4  PROT 6.6  ALBUMIN 3.3*   No results found for this basename: LIPASE, AMYLASE,  in the last 168 hours No results found for this basename: AMMONIA,  in the last 168 hours CBC:  Recent Labs Lab 05/09/14 0312 05/11/14 0600  WBC 13.6* 6.4  HGB 12.0 10.8*  HCT 35.5* 32.0*  MCV 90.8 90.7  PLT 151 166   Cardiac Enzymes: No results found for this basename: CKTOTAL, CKMB, CKMBINDEX, TROPONINI,  in the last 168 hours BNP (last 3 results) No results found for this basename: PROBNP,  in the last 8760 hours CBG: No results found for this basename: GLUCAP,  in the last 168 hours  Recent Results (from  the past 240 hour(s))  CULTURE, EXPECTORATED SPUTUM-ASSESSMENT     Status: None   Collection Time    05/09/14  3:10 AM      Result Value Ref Range Status   Specimen Description SPUTUM   Final   Special Requests NONE   Final   Sputum evaluation     Final   Value: THIS SPECIMEN IS ACCEPTABLE. RESPIRATORY CULTURE REPORT TO FOLLOW.   Report Status 05/09/2014 FINAL   Final  CULTURE, RESPIRATORY (NON-EXPECTORATED)     Status: None   Collection  Time    05/09/14  3:10 AM      Result Value Ref Range Status   Specimen Description SPUTUM   Final   Special Requests NONE   Final   Gram Stain     Final   Value: FEW WBC PRESENT,BOTH PMN AND MONONUCLEAR     NO SQUAMOUS EPITHELIAL CELLS SEEN     NO ORGANISMS SEEN     Performed at [facility]   Culture     Final   Value: NORMAL OROPHARYNGEAL FLORA     Performed at [facility]   Report Status PENDING   Incomplete  CULTURE, BLOOD (ROUTINE X 2)     Status: None   Collection Time    05/09/14  5:40 AM      Result Value Ref Range Status   Specimen Description BLOOD RIGHT ANTECUBITAL   Final   Special Requests BOTTLES DRAWN AEROBIC ONLY 5CC   Final   Culture  Setup Time     Final   Value: 05/09/2014 14:34     Performed at [facility]   Culture     Final   Value:        BLOOD CULTURE RECEIVED NO GROWTH TO DATE CULTURE WILL BE HELD FOR 5 DAYS BEFORE ISSUING A FINAL NEGATIVE REPORT     Performed at [facility]   Report Status PENDING   Incomplete  CULTURE, BLOOD (ROUTINE X 2)     Status: None   Collection Time    05/09/14  5:45 AM      Result Value Ref Range Status   Specimen Description BLOOD RIGHT HAND   Final   Special Requests BOTTLES DRAWN AEROBIC ONLY 5CC   Final   Culture  Setup Time     Final   Value: 05/09/2014 14:34     Performed at [facility]   Culture     Final   Value:        BLOOD CULTURE RECEIVED NO GROWTH TO DATE CULTURE WILL BE HELD FOR 5 DAYS BEFORE ISSUING A FINAL NEGATIVE REPORT     Performed at [facility]   Report Status PENDING   Incomplete     Studies: No results found.  Scheduled Meds: . aspirin  325 mg Oral BID  . ceFEPime (MAXIPIME) IV  1 g Intravenous 3 times per day  . cholecalciferol  1,000 Units Oral Daily  . diazepam  10 mg Oral QHS  . gabapentin  1,200 mg Oral QHS  . gabapentin  600 mg Oral TID PC  . levothyroxine  50 mcg Oral QAC breakfast  . methocarbamol  500 mg Oral QID  .  multivitamin with minerals  1 tablet Oral Daily  . pantoprazole  40 mg Oral Daily  . senna-docusate  1 tablet Oral BID  . vancomycin  500 mg Intravenous Q12H   Continuous Infusions: . sodium chloride 50 mL/hr at 05/11/14  0634    Principal Problem:   Acute respiratory failure with hypoxia Active Problems:   Spinal stenosis in cervical region   HCAP (healthcare-associated pneumonia)   Hypertension   Hypothyroidism   Breast cancer   Pneumonia    Time spent: >35 minutes     Kinnie Feil  [facility] Pager [phone]. If 7PM-7AM, please contact night-coverage at [email or web address], password [facility] 05/11/2014, 10:07 AM  LOS: 2 days

## 2014-05-12 LAB — CREATININE, SERUM
Creatinine, Ser: 0.56 mg/dL (ref 0.50–1.10)
GFR calc Af Amer: >90 mL/min (ref >90)
GFR calc non Af Amer: >90 mL/min (ref >90)

## 2014-05-12 MED ORDER — HYDROCODONE-ACETAMINOPHEN 7.5-325 MG PO TABS
1.0000 | ORAL_TABLET | Freq: Four times a day (QID) | ORAL | Status: DC | PRN
  Administered 2014-05-12 – 2014-05-13 (×3): 1 via ORAL
  Filled 2014-05-12 (×6): qty 1

## 2014-05-12 MED ORDER — BISACODYL 10 MG RE SUPP
10.0000 mg | Freq: Once | RECTAL | Status: DC
  Filled 2014-05-12: qty 1

## 2014-05-12 MED ORDER — ASPIRIN 81 MG PO CHEW
CHEWABLE_TABLET | ORAL | Status: AC
  Filled 2014-05-12: qty 1

## 2014-05-12 NOTE — Progress Notes (Addendum)
ANTIBIOTIC CONSULT NOTE - FOLLOW UP  Pharmacy Consult for Vancomycin + Cefepime Indication: r/o HCAP  No Known Allergies  Patient Measurements: Height: 5' 1.5" (156.2 cm) Weight: 151 lb 6.4 oz (68.675 kg) IBW/kg (Calculated) : 48.95  Vital Signs: Temp: 98.6 F (37 C) (09/21 1006) Temp src: Oral (09/21 1006) BP: 142/65 mmHg (09/21 1006) Pulse Rate: 85 (09/21 1006) Intake/Output from previous day: 09/20 0701 - 09/21 0700 In: 6144 [P.O.:1440; IV Piggyback:150] Out: 1000 [Urine:1000] Intake/Output from this shift: Total I/O In: 540 [P.O.:480; Other:60] Out: -   Labs:  Recent Labs  05/11/14 0600  WBC 6.4  HGB 10.8*  PLT 166   Estimated Creatinine Clearance: 58.8 ml/min (by C-G formula based on Cr of 0.65). No results found for this basename: VANCOTROUGH, Corlis Leak, VANCORANDOM, GENTTROUGH, GENTPEAK, GENTRANDOM, TOBRATROUGH, TOBRAPEAK, TOBRARND, AMIKACINPEAK, AMIKACINTROU, AMIKACIN,  in the last 72 hours   Microbiology: Recent Results (from the past 720 hour(s))  SURGICAL PCR SCREEN     Status: Abnormal   Collection Time    04/23/14  2:09 PM      Result Value Ref Range Status   MRSA, PCR POSITIVE (*) NEGATIVE Final   Staphylococcus aureus POSITIVE (*) NEGATIVE Final   Comment:            The Xpert SA Assay (FDA     approved for NASAL specimens     in patients over 27 years of age),     is one component of     a comprehensive surveillance     program.  Test performance has     been validated by [employer] for patients greater     than or equal to 41 year old.     It is not intended     to diagnose infection nor to     guide or monitor treatment.  CULTURE, EXPECTORATED SPUTUM-ASSESSMENT     Status: None   Collection Time    05/09/14  3:10 AM      Result Value Ref Range Status   Specimen Description SPUTUM   Final   Special Requests NONE   Final   Sputum evaluation     Final   Value: THIS SPECIMEN IS ACCEPTABLE. RESPIRATORY CULTURE REPORT TO FOLLOW.   Report Status 05/09/2014 FINAL   Final  CULTURE, RESPIRATORY (NON-EXPECTORATED)     Status: None   Collection Time    05/09/14  3:10 AM      Result Value Ref Range Status   Specimen Description SPUTUM   Final   Special Requests NONE   Final   Gram Stain     Final   Value: FEW WBC PRESENT,BOTH PMN AND MONONUCLEAR     NO SQUAMOUS EPITHELIAL CELLS SEEN     NO ORGANISMS SEEN     Performed at [facility]   Culture     Final   Value: NORMAL OROPHARYNGEAL FLORA     Performed at [facility]   Report Status 05/11/2014 FINAL   Final  CULTURE, BLOOD (ROUTINE X 2)     Status: None   Collection Time    05/09/14  5:40 AM      Result Value Ref Range Status   Specimen Description BLOOD RIGHT ANTECUBITAL   Final   Special Requests BOTTLES DRAWN AEROBIC ONLY 5CC   Final   Culture  Setup Time     Final   Value: 05/09/2014 14:34     Performed at [facility]  Culture     Final   Value:        BLOOD CULTURE RECEIVED NO GROWTH TO DATE CULTURE WILL BE HELD FOR 5 DAYS BEFORE ISSUING A FINAL NEGATIVE REPORT     Performed at [facility]   Report Status PENDING   Incomplete  CULTURE, BLOOD (ROUTINE X 2)     Status: None   Collection Time    05/09/14  5:45 AM      Result Value Ref Range Status   Specimen Description BLOOD RIGHT HAND   Final   Special Requests BOTTLES DRAWN AEROBIC ONLY 5CC   Final   Culture  Setup Time     Final   Value: 05/09/2014 14:34     Performed at [facility]   Culture     Final   Value:        BLOOD CULTURE RECEIVED NO GROWTH TO DATE CULTURE WILL BE HELD FOR 5 DAYS BEFORE ISSUING A FINAL NEGATIVE REPORT     Performed at [facility]   Report Status PENDING   Incomplete    Anti-infectives   Start     Dose/Rate Route Frequency Ordered Stop   05/09/14 0615  vancomycin (VANCOCIN) 500 mg in sodium chloride 0.9 % 100 mL IVPB     500 mg 100 mL/hr over 60 Minutes Intravenous Every 12 hours 05/09/14 0605     05/09/14 0500   ceFEPIme (MAXIPIME) 1 g in dextrose 5 % 50 mL IVPB     1 g 100 mL/hr over 30 Minutes Intravenous 3 times per day 05/09/14 0245 05/17/14 0559      Assessment: 68 YOF who continues on Vancomycin + Cefepime for r/o HCAP. Afebrile, WBC wnl, SCr 0.65 from 9/19. CrCl~50-60 ml/min. UOP down slightly today - per MD possible discharge tomorrow with transition to po antibiotics. Will recheck a SCr this evening to ensure doses appropriate since it has been ~72 hours. Will adjust dose if lab noted to be elevated.  Goal of Therapy:  Vancomycin trough level 15-20 mcg/ml  Plan:  1. Continue Vancomycin 500 mg IV every 12 hours 2. Continue Cefepime 1g IV every 8 hours 3. Will f/u with a SCr this afternoon to ensure doses still appropraite 4. Will continue to follow renal function, culture results, LOT, and antibiotic de-escalation plans   Alycia Rossetti, PharmD, BCPS Clinical Pharmacist Pager: [phone] 05/12/2014 3:23 PM     PM f/u: Scr stable, will continue same vancomycin dosing.  Uvaldo Rising, BCPS  Clinical Pharmacist Pager [phone]  05/12/2014 5:55 PM

## 2014-05-12 NOTE — Progress Notes (Signed)
[facility] PROGRESS NOTE  Karen Allison WER:[id number] DOB: [birth year] DOA: 05/09/2014 PCP: [name], MD  Assessment/Plan: 68 y.o. Caucasian female with history of breast cancer, pneumonia, hypothyroidism, arthritis, and spinal stenosis in the cervical region C3-4 who underwent anterior cervical discectomy fusion C3-4 on 05/06/2014 presented with cough, fever found to have pneumonia   -CT angiography of the chest with contrast at [facility] on 05/08/2014: No pulmonary emboli, patchy bilateral groundglass opacities greatest in the left lower lobe suspicious for aspiration or infection.   1. HCAP; CT: BL pneumonia; -clinically improving, leukocytosis resolved, afebrile; cont IV atx, blood cultures: NGTD; IVF as needed  2. Acute respiratory failure with hypoxia due to healthcare associated pneumonia  -hypoxia resolved with oxygen, Rx pneumonia, inhalers, NiPPV prn; desat study prior to discharge  3. Cervical spinal stenosis status post anterior fusion on 05/06/2014  -per neurosurgery   4. Hypothyroidism Continue Synthroid.  5. History of breast cancer  -Resume Herceptin after patient recovers from acute illness.  6. Patient denies having HTN; she also reports recent echo which was normal;   Possible d/c in AM if stable    DVT Prophylaxis; d/w patient, neurosurgery at length; Pt had h/o spinal hematoma due to anticoagulation lovenox;  -due to high risk of re bleeding neurosurgery recommended ASA 325 BID, added PPI for GI prophylaxis; cotn SCD, ambulate   Code Status: full Family Communication: d/w patient, recently called updated [facility] Daughter [phone]; [name], [name] Spouse [phone] no answer;     (indicate person spoken with, relationship, and if by phone, the number) Disposition Plan: home pend clinical improvement    Consultants:  Neurosurgery   Procedures:  none  Antibiotics:  vanc 9/18<<<<  Cefepime 9/18<<<    (indicate start date, and stop date if known)  HPI/Subjective: alert  Objective: Filed Vitals:   05/12/14 1006  BP: 142/65  Pulse: 85  Temp: 98.6 F (37 C)  Resp: 21    Intake/Output Summary (Last 24 hours) at 05/12/14 1206 Last data filed at 05/12/14 1100  Gross per 24 hour  Intake   1770 ml  Output      0 ml  Net   1770 ml   Filed Weights   05/09/14 0225 05/09/14 2102 05/10/14 2024  Weight: 69.1 kg (152 lb 5.4 oz) 70.035 kg (154 lb 6.4 oz) 68.675 kg (151 lb 6.4 oz)    Exam:   General:  alert  Cardiovascular: s1,s2 rrr  Respiratory: BL rhonchi   Abdomen: soft, nt,nd   Musculoskeletal: no LE edema   Data Reviewed: Basic Metabolic Panel:  Recent Labs Lab 05/09/14 0312  NA 140  K 3.8  CL 99  CO2 28  GLUCOSE 94  BUN 7  CREATININE 0.65  CALCIUM 8.7   Liver Function Tests:  Recent Labs Lab 05/09/14 0312  AST 29  ALT 16  ALKPHOS 64  BILITOT 0.4  PROT 6.6  ALBUMIN 3.3*   No results found for this basename: LIPASE, AMYLASE,  in the last 168 hours No results found for this basename: AMMONIA,  in the last 168 hours CBC:  Recent Labs Lab 05/09/14 0312 05/11/14 0600  WBC 13.6* 6.4  HGB 12.0 10.8*  HCT 35.5* 32.0*  MCV 90.8 90.7  PLT 151 166   Cardiac Enzymes: No results found for this basename: CKTOTAL, CKMB, CKMBINDEX, TROPONINI,  in the last 168 hours BNP (last 3 results) No results found for this basename: PROBNP,  in the last 8760 hours CBG: No results found for  this basename: GLUCAP,  in the last 168 hours  Recent Results (from the past 240 hour(s))  CULTURE, EXPECTORATED SPUTUM-ASSESSMENT     Status: None   Collection Time    05/09/14  3:10 AM      Result Value Ref Range Status   Specimen Description SPUTUM   Final   Special Requests NONE   Final   Sputum evaluation     Final   Value: THIS SPECIMEN IS ACCEPTABLE. RESPIRATORY CULTURE REPORT TO FOLLOW.   Report Status 05/09/2014 FINAL   Final  CULTURE, RESPIRATORY  (NON-EXPECTORATED)     Status: None   Collection Time    05/09/14  3:10 AM      Result Value Ref Range Status   Specimen Description SPUTUM   Final   Special Requests NONE   Final   Gram Stain     Final   Value: FEW WBC PRESENT,BOTH PMN AND MONONUCLEAR     NO SQUAMOUS EPITHELIAL CELLS SEEN     NO ORGANISMS SEEN     Performed at [facility]   Culture     Final   Value: NORMAL OROPHARYNGEAL FLORA     Performed at [facility]   Report Status 05/11/2014 FINAL   Final  CULTURE, BLOOD (ROUTINE X 2)     Status: None   Collection Time    05/09/14  5:40 AM      Result Value Ref Range Status   Specimen Description BLOOD RIGHT ANTECUBITAL   Final   Special Requests BOTTLES DRAWN AEROBIC ONLY 5CC   Final   Culture  Setup Time     Final   Value: 05/09/2014 14:34     Performed at [facility]   Culture     Final   Value:        BLOOD CULTURE RECEIVED NO GROWTH TO DATE CULTURE WILL BE HELD FOR 5 DAYS BEFORE ISSUING A FINAL NEGATIVE REPORT     Performed at [facility]   Report Status PENDING   Incomplete  CULTURE, BLOOD (ROUTINE X 2)     Status: None   Collection Time    05/09/14  5:45 AM      Result Value Ref Range Status   Specimen Description BLOOD RIGHT HAND   Final   Special Requests BOTTLES DRAWN AEROBIC ONLY 5CC   Final   Culture  Setup Time     Final   Value: 05/09/2014 14:34     Performed at [facility]   Culture     Final   Value:        BLOOD CULTURE RECEIVED NO GROWTH TO DATE CULTURE WILL BE HELD FOR 5 DAYS BEFORE ISSUING A FINAL NEGATIVE REPORT     Performed at [facility]   Report Status PENDING   Incomplete     Studies: No results found.  Scheduled Meds: . aspirin  325 mg Oral BID  . bisacodyl  10 mg Rectal Once  . ceFEPime (MAXIPIME) IV  1 g Intravenous 3 times per day  . cholecalciferol  1,000 Units Oral Daily  . diazepam  10 mg Oral QHS  . gabapentin  1,200 mg Oral QHS  . gabapentin  600 mg Oral TID PC  .  ipratropium-albuterol  3 mL Nebulization Q4H  . levothyroxine  50 mcg Oral QAC breakfast  . methocarbamol  500 mg Oral QID  . pantoprazole  40 mg Oral Daily  . senna-docusate  1 tablet Oral  BID  . vancomycin  500 mg Intravenous Q12H   Continuous Infusions:    Principal Problem:   Acute respiratory failure with hypoxia Active Problems:   Spinal stenosis in cervical region   HCAP (healthcare-associated pneumonia)   Hypertension   Hypothyroidism   Breast cancer   Pneumonia    Time spent: >35 minutes     Kinnie Feil  [facility] Pager [phone]. If 7PM-7AM, please contact night-coverage at [email or web address], password [facility] 05/12/2014, 12:06 PM  LOS: 3 days

## 2014-05-12 NOTE — Progress Notes (Signed)
Physical Therapy Treatment Patient Details Name: BURNADETTE BASKETT MRN: [id number] DOB: 1946/04/24 Today's Date: 05/12/2014    History of Present Illness Patient is a 68 y/o female with recent discharge on 9/16 s/p ACDF C3-4 presents with worsening SOB, cough and fever of 101 during the night with chills. Pt admitted with acute respiratory failure with hypoxia due to healthcare associated pneumonia.    PT Comments    Much improved functional mobility compared to last session; Session conducted on room Air, and O2 sats remained greater than or equal to 90%  Follow Up Recommendations  Outpatient PT;Supervision/Assistance - 24 hour Pt will likely decline HHPT The potential need for Outpatient PT for gait asymetries (history of cauda equina syndrome) can be addressed at MD follow-up appointments.      Equipment Recommendations  None recommended by PT    Recommendations for Other Services       Precautions / Restrictions Precautions Precautions: Cervical Precaution Comments: Reviewed cervical precautions Required Braces or Orthoses: Cervical Brace Cervical Brace: Soft collar;At all times    Mobility  Bed Mobility                  Transfers   Equipment used: Rolling walker (2 wheeled) Transfers: Sit to/from Stand Sit to Stand: Supervision         General transfer comment: Noting improved balance, and less need for close guard for safety  Ambulation/Gait Ambulation/Gait assistance: Supervision Ambulation Distance (Feet): 60 Feet Assistive device: Rolling walker (2 wheeled) Gait Pattern/deviations: Step-through pattern     General Gait Details: Improved gait with 3 wheeled rollator RW today, with no LOB noted with multiple turns in room (did "laps" in room as pt did not want to walk the hallways); Trendelenburg pattern noted last admission is still present   Stairs Stairs:  (Pt declined steps)          Wheelchair Mobility    Modified Rankin (Stroke  Patients Only)       Balance     Sitting balance-Leahy Scale: Good       Standing balance-Leahy Scale: Fair                      Cognition Arousal/Alertness: Awake/alert Behavior During Therapy: WFL for tasks assessed/performed Overall Cognitive Status: Within Functional Limits for tasks assessed                      Exercises      General Comments General comments (skin integrity, edema, etc.): Session conducted on Room Air, and O2 sats remained greater than or equal to 90%      Pertinent Vitals/Pain Pain Assessment: No/denies pain    Home Living                      Prior Function            PT Goals (current goals can now be found in the care plan section) Acute Rehab PT Goals Patient Stated Goal: to feel better PT Goal Formulation: With patient Time For Goal Achievement: 05/23/14 Potential to Achieve Goals: Good Progress towards PT goals: Progressing toward goals    Frequency  Min 3X/week    PT Plan Current plan remains appropriate    Co-evaluation             End of Session Equipment Utilized During Treatment: Cervical collar Activity Tolerance: Patient tolerated treatment well Patient left: in chair;with call bell/phone within reach  Time: 3662-9476 (minus 10 minutes for bathroom, Dr. Louanne Skye visit) PT Time Calculation (min): 38 min  Charges:  $Gait Training: 8-22 mins $Therapeutic Activity: 8-22 mins                    G Codes:      Quin Hoop 05/12/2014, 1:22 PM  Roney Marion, [place] Pager [phone] Office [phone]

## 2014-05-13 MED ORDER — LEVOFLOXACIN 750 MG PO TABS: 750.0000 mg | ORAL_TABLET | Freq: Every day | ORAL | Status: DC

## 2014-05-13 MED ORDER — ALBUTEROL SULFATE (2.5 MG/3ML) 0.083% IN NEBU: 2.5000 mg | INHALATION_SOLUTION | Freq: Four times a day (QID) | RESPIRATORY_TRACT | Status: DC | PRN

## 2014-05-13 MED ORDER — NYSTATIN 100000 UNIT/ML MT SUSP: 5.0000 mL | Freq: Four times a day (QID) | OROMUCOSAL | Status: DC

## 2014-05-13 NOTE — Discharge Instructions (Signed)
Please follow up with primary care doctor in 1 week

## 2014-05-13 NOTE — Care Management Note (Signed)
CARE MANAGEMENT NOTE 05/13/2014  Patient:  Karen Allison, Karen Allison   Account Number:  [id number]  Date Initiated:  05/09/2014  Documentation initiated by:  Lizabeth Leyden  Subjective/Objective Assessment:   admitted with HCAP  inpt 05/06/2014 cervical disectomy C3C4     Action/Plan:   progression of care and discharge planning   Anticipated DC Date:  05/13/2014   Anticipated DC Plan:  HOME/SELF CARE         Choice offered to / List presented to:     DME arranged  [facility]UL[facility]      DME agency  [facility].        Status of service:  Completed, signed off Medicare Important Message given?  YES (If response is "NO", the following Medicare IM given date fields will be blank) Date Medicare IM given:  05/13/2014 Medicare IM given by:  Jaeden Messer Date Additional Medicare IM given:   Additional Medicare IM given by:    Discharge Disposition:  HOME/SELF CARE  Per UR Regulation:    If discussed at Long Length of Stay Meetings, dates discussed:    Comments:

## 2014-05-13 NOTE — Care Management Note (Signed)
CARE MANAGEMENT NOTE 05/13/2014  Patient:  Karen Allison, Karen Allison   Account Number:  [id number]  Date Initiated:  05/09/2014  Documentation initiated by:  Lizabeth Leyden  Subjective/Objective Assessment:   admitted with HCAP  inpt 05/06/2014 cervical disectomy C3C4     Action/Plan:   progression of care and discharge planning   Anticipated DC Date:  05/13/2014   Anticipated DC Plan:  HOME/SELF CARE         Choice offered to / List presented to:             Status of service:  Completed, signed off Medicare Important Message given?  YES (If response is "NO", the following Medicare IM given date fields will be blank) Date Medicare IM given:  05/13/2014 Medicare IM given by:  Larrie Lucia Date Additional Medicare IM given:   Additional Medicare IM given by:    Discharge Disposition:  HOME/SELF CARE  Per UR Regulation:    If discussed at Long Length of Stay Meetings, dates discussed:    Comments:

## 2014-05-13 NOTE — Discharge Summary (Signed)
Physician Discharge Summary  Karen Allison:[id number] DOB: 06-Jun-1946 DOA: 05/09/2014  PCP: [name], MD  Admit date: 05/09/2014 Discharge date: 05/13/2014  Time spent: >35 minutes  Recommendations for Outpatient Follow-up:  F/u with PCP 1 week F/u with Dr. Louanne Skye as scheduled   Discharge Diagnoses:  Principal Problem:   Acute respiratory failure with hypoxia Active Problems:   Spinal stenosis in cervical region   HCAP (healthcare-associated pneumonia)   Hypertension   Hypothyroidism   Breast cancer   Pneumonia   Discharge Condition: stable   Diet recommendation: low sodium  Filed Weights   05/09/14 2102 05/10/14 2024 05/12/14 2136  Weight: 70.035 kg (154 lb 6.4 oz) 68.675 kg (151 lb 6.4 oz) 68.19 kg (150 lb 5.3 oz)    History of present illness:  68 y.o. Caucasian female with history of breast cancer, pneumonia, hypothyroidism, arthritis, and spinal stenosis in the cervical region C3-4 who underwent anterior cervical discectomy fusion C3-4 on 05/06/2014 presented with cough, fever found to have pneumonia  -CT angiography of the chest with contrast at [facility] on 05/08/2014: No pulmonary emboli, patchy bilateral groundglass opacities greatest in the left lower lobe suspicious for aspiration or infection.   Hospital Course:  1. HCAP; CT: BL pneumonia;  -clinically improved on IV atx (Cefepime+vanc for 5 days), leukocytosis resolved, afebrile; blood cultures: NGTD; atx changed to PO to complete treatment course; f/u with CXR in 4-6 weeks  2. Acute respiratory failure with hypoxia due to healthcare associated pneumonia  -hypoxia resolved 3. Cervical spinal stenosis status post anterior fusion on 05/06/2014  -per neurosurgery  4. Hypothyroidism Continue Synthroid.  5. History of breast cancer  -Resume Herceptin after patient recovers from acute illness.  6. Patient denies having HTN; she also reports recent echo which was normal;        Procedures:  none (i.e. Studies not automatically included, echos, thoracentesis, etc; not x-rays)  Consultations:  Neurosurgery   Discharge Exam: Filed Vitals:   05/13/14 1010  BP: 164/82  Pulse: 83  Temp:   Resp:     General: alert Cardiovascular: s1,s2 rrr Respiratory: CTA BL  Discharge Instructions  Discharge Instructions   DME Nebulizer machine    Complete by:  As directed      DME Nebulizer/meds    Complete by:  As directed      Diet - low sodium heart healthy    Complete by:  As directed      Discharge instructions    Complete by:  As directed   Please follow up with primary care doctor in 1 week     For home use only DME Nebulizer machine    Complete by:  As directed      Increase activity slowly    Complete by:  As directed             Medication List    STOP taking these medications       azithromycin 250 MG tablet  Commonly known as:  ZITHROMAX      TAKE these medications       albuterol (2.5 MG/3ML) 0.083% nebulizer solution  Commonly known as:  PROVENTIL  Take 3 mLs (2.5 mg total) by nebulization every 6 (six) hours as needed for wheezing or shortness of breath.     atenolol 50 MG tablet  Commonly known as:  TENORMIN  Take 12.5 mg by mouth daily as needed (palpitations).     CALTRATE 600+D PO  Take 1 tablet by mouth 2 (  two) times daily.     CELEBREX 200 MG capsule  Generic drug:  celecoxib  Take 200 mg by mouth daily.     cholecalciferol 1000 UNITS tablet  Commonly known as:  VITAMIN D  Take 1,000 Units by mouth daily.     conjugated estrogens vaginal cream  Commonly known as:  PREMARIN  Place 0.5 Applicatorfuls vaginally 2 (two) times a week. Rubs on outside.     diazepam 10 MG tablet  Commonly known as:  VALIUM  Take 10 mg by mouth at bedtime.     docusate sodium 100 MG capsule  Commonly known as:  COLACE  Take 200 mg by mouth 2 (two) times daily as needed for mild constipation.     gabapentin 600 MG tablet   Commonly known as:  NEURONTIN  Take 600-1,200 mg by mouth 4 (four) times daily - after meals and at bedtime. 1 tablet (600 mg) 3 times a day and 2 tablets(1200mg ) at bedtime     HERCEPTIN 440 MG injection  Generic drug:  trastuzumab  440 mg by Intravenous (Continuous Infusion) route every 21 ( twenty-one) days. infusion every 3 weeks     HYDROcodone-acetaminophen 7.5-325 MG per tablet  Commonly known as:  NORCO  Take 2 tablets by mouth 2 (two) times daily as needed for moderate pain.     levofloxacin 750 MG tablet  Commonly known as:  LEVAQUIN  Take 1 tablet (750 mg total) by mouth daily.     levothyroxine 50 MCG tablet  Commonly known as:  SYNTHROID, LEVOTHROID  Take 50 mcg by mouth daily.     methocarbamol 500 MG tablet  Commonly known as:  ROBAXIN  Take 500 mg by mouth 4 (four) times daily.     multivitamin with minerals Tabs tablet  Take 1 tablet by mouth daily.     nystatin 100000 UNIT/ML suspension  Commonly known as:  MYCOSTATIN  Take 5 mLs (500,000 Units total) by mouth 4 (four) times daily.     oxyCODONE-acetaminophen 5-325 MG per tablet  Commonly known as:  ROXICET  Take 1-2 tablets by mouth every 4 (four) hours as needed for severe pain.     promethazine 25 MG tablet  Commonly known as:  PHENERGAN  Take 25 mg by mouth every 6 (six) hours as needed for nausea or vomiting.     senna 8.6 MG tablet  Commonly known as:  SENOKOT  Take 3-4 tablets by mouth at bedtime.     zolendronic acid 4 MG/5ML injection  Commonly known as:  ZOMETA  Inject 4 mg into the vein every 6 (six) months.       No Known Allergies     Follow-up Information   Follow up with Jessy Oto, MD.   Specialty:  Orthopedic Surgery   Contact information:   [place] [address] [phone]       Follow up with PROCHNAU,CAROLINE, MD In 1 week.   Specialty:  Internal Medicine   Contact information:   [place] [place] [address] [phone]         The results of significant diagnostics from this hospitalization (including imaging, microbiology, ancillary and laboratory) are listed below for reference.    Significant Diagnostic Studies: Dg Chest 2 View  04/23/2014   CLINICAL DATA:  68 year old female preoperative study for cervical spine surgery. Initial encounter.  EXAM: CHEST  2 VIEW  COMPARISON:  04/10/2007.  FINDINGS: Interval lumbar fusion hardware placement. Stable lung volumes,  mild eventration of the right hemidiaphragm. Stable cardiac size at the upper limits of normal. Other mediastinal contours are within normal limits. Visualized tracheal air column is within normal limits. Chronic postoperative changes to the left breast. No pneumothorax, pulmonary edema, pleural effusion or acute pulmonary opacity. Very advanced and progressed degenerative changes at the left glenohumeral joint.  IMPRESSION: No acute cardiopulmonary abnormality.   Electronically Signed   By: Lars Pinks M.D.   On: 04/23/2014 14:45   Dg Cervical Spine 2-3 Views  05/06/2014   CLINICAL DATA:  Anterior cervical disc fusion of C3-4.  EXAM: DG C-ARM 61-120 MIN; CERVICAL SPINE - 2-3 VIEW  COMPARISON:  MRI of June 21, 2013.  FINDINGS: Two intraoperative fluoroscopic images of the cervical spine were submitted for review. These demonstrate the patient to be status post surgical anterior fusion of C3-4. Good alignment of vertebral bodies is noted.  IMPRESSION: Status post surgical anterior fusion of C3-4.   Electronically Signed   By: Sabino Dick M.D.   On: 05/06/2014 10:16   Dg C-arm 1-60 Min  05/06/2014   CLINICAL DATA:  Anterior cervical disc fusion of C3-4.  EXAM: DG C-ARM 61-120 MIN; CERVICAL SPINE - 2-3 VIEW  COMPARISON:  MRI of June 21, 2013.  FINDINGS: Two intraoperative fluoroscopic images of the cervical spine were submitted for review. These demonstrate the patient to be status post surgical anterior fusion of C3-4. Good alignment of vertebral bodies is  noted.  IMPRESSION: Status post surgical anterior fusion of C3-4.   Electronically Signed   By: Sabino Dick M.D.   On: 05/06/2014 10:16    Microbiology: Recent Results (from the past 240 hour(s))  CULTURE, EXPECTORATED SPUTUM-ASSESSMENT     Status: None   Collection Time    05/09/14  3:10 AM      Result Value Ref Range Status   Specimen Description SPUTUM   Final   Special Requests NONE   Final   Sputum evaluation     Final   Value: THIS SPECIMEN IS ACCEPTABLE. RESPIRATORY CULTURE REPORT TO FOLLOW.   Report Status 05/09/2014 FINAL   Final  CULTURE, RESPIRATORY (NON-EXPECTORATED)     Status: None   Collection Time    05/09/14  3:10 AM      Result Value Ref Range Status   Specimen Description SPUTUM   Final   Special Requests NONE   Final   Gram Stain     Final   Value: FEW WBC PRESENT,BOTH PMN AND MONONUCLEAR     NO SQUAMOUS EPITHELIAL CELLS SEEN     NO ORGANISMS SEEN     Performed at [facility]   Culture     Final   Value: NORMAL OROPHARYNGEAL FLORA     Performed at [facility]   Report Status 05/11/2014 FINAL   Final  CULTURE, BLOOD (ROUTINE X 2)     Status: None   Collection Time    05/09/14  5:40 AM      Result Value Ref Range Status   Specimen Description BLOOD RIGHT ANTECUBITAL   Final   Special Requests BOTTLES DRAWN AEROBIC ONLY 5CC   Final   Culture  Setup Time     Final   Value: 05/09/2014 14:34     Performed at [facility]   Culture     Final   Value:        BLOOD CULTURE RECEIVED NO GROWTH TO DATE CULTURE WILL BE HELD FOR 5 DAYS BEFORE ISSUING  A FINAL NEGATIVE REPORT     Performed at [facility]   Report Status PENDING   Incomplete  CULTURE, BLOOD (ROUTINE X 2)     Status: None   Collection Time    05/09/14  5:45 AM      Result Value Ref Range Status   Specimen Description BLOOD RIGHT HAND   Final   Special Requests BOTTLES DRAWN AEROBIC ONLY 5CC   Final   Culture  Setup Time     Final   Value: 05/09/2014 14:34      Performed at [facility]   Culture     Final   Value:        BLOOD CULTURE RECEIVED NO GROWTH TO DATE CULTURE WILL BE HELD FOR 5 DAYS BEFORE ISSUING A FINAL NEGATIVE REPORT     Performed at [facility]   Report Status PENDING   Incomplete     Labs: Basic Metabolic Panel:  Recent Labs Lab 05/09/14 0312 05/12/14 1610  NA 140  --   K 3.8  --   CL 99  --   CO2 28  --   GLUCOSE 94  --   BUN 7  --   CREATININE 0.65 0.56  CALCIUM 8.7  --    Liver Function Tests:  Recent Labs Lab 05/09/14 0312  AST 29  ALT 16  ALKPHOS 64  BILITOT 0.4  PROT 6.6  ALBUMIN 3.3*   No results found for this basename: LIPASE, AMYLASE,  in the last 168 hours No results found for this basename: AMMONIA,  in the last 168 hours CBC:  Recent Labs Lab 05/09/14 0312 05/11/14 0600  WBC 13.6* 6.4  HGB 12.0 10.8*  HCT 35.5* 32.0*  MCV 90.8 90.7  PLT 151 166   Cardiac Enzymes: No results found for this basename: CKTOTAL, CKMB, CKMBINDEX, TROPONINI,  in the last 168 hours BNP: BNP (last 3 results) No results found for this basename: PROBNP,  in the last 8760 hours CBG: No results found for this basename: GLUCAP,  in the last 168 hours     Signed:  Rowe Clack N  [facility] 05/13/2014, 1:15 PM

## 2014-05-13 NOTE — Progress Notes (Signed)
PT Cancellation Note  Patient Details Name: Karen Allison MRN: [id number] DOB: April 08, 1946   Cancelled Treatment:    Reason Eval/Treat Not Completed: Other (comment) (politely declined)   Roney Marion [facility] 05/13/2014, 4:18 PM

## 2014-05-15 LAB — CULTURE, BLOOD (ROUTINE X 2)
Culture: NO GROWTH
Culture: NO GROWTH

## 2014-05-22 DIAGNOSIS — M549 Dorsalgia, unspecified: Secondary | ICD-10-CM | POA: Diagnosis not present

## 2014-05-22 DIAGNOSIS — J189 Pneumonia, unspecified organism: Secondary | ICD-10-CM | POA: Diagnosis not present

## 2014-05-22 DIAGNOSIS — E039 Hypothyroidism, unspecified: Secondary | ICD-10-CM | POA: Diagnosis not present

## 2014-05-28 DIAGNOSIS — M5020 Other cervical disc displacement, unspecified cervical region: Secondary | ICD-10-CM | POA: Diagnosis not present

## 2014-05-28 DIAGNOSIS — M4712 Other spondylosis with myelopathy, cervical region: Secondary | ICD-10-CM | POA: Diagnosis not present

## 2014-05-29 DIAGNOSIS — C787 Secondary malignant neoplasm of liver and intrahepatic bile duct: Secondary | ICD-10-CM | POA: Diagnosis not present

## 2014-05-29 DIAGNOSIS — Z5111 Encounter for antineoplastic chemotherapy: Secondary | ICD-10-CM | POA: Diagnosis not present

## 2014-05-29 DIAGNOSIS — C50912 Malignant neoplasm of unspecified site of left female breast: Secondary | ICD-10-CM | POA: Diagnosis not present

## 2014-05-29 DIAGNOSIS — M810 Age-related osteoporosis without current pathological fracture: Secondary | ICD-10-CM | POA: Diagnosis not present

## 2014-06-04 DIAGNOSIS — Z23 Encounter for immunization: Secondary | ICD-10-CM | POA: Diagnosis not present

## 2014-06-18 DIAGNOSIS — M4712 Other spondylosis with myelopathy, cervical region: Secondary | ICD-10-CM | POA: Diagnosis not present

## 2014-06-18 DIAGNOSIS — M5020 Other cervical disc displacement, unspecified cervical region: Secondary | ICD-10-CM | POA: Diagnosis not present

## 2014-06-20 DIAGNOSIS — Z5111 Encounter for antineoplastic chemotherapy: Secondary | ICD-10-CM | POA: Diagnosis not present

## 2014-06-20 DIAGNOSIS — C50912 Malignant neoplasm of unspecified site of left female breast: Secondary | ICD-10-CM | POA: Diagnosis not present

## 2014-06-20 DIAGNOSIS — C787 Secondary malignant neoplasm of liver and intrahepatic bile duct: Secondary | ICD-10-CM | POA: Diagnosis not present

## 2014-06-20 DIAGNOSIS — M810 Age-related osteoporosis without current pathological fracture: Secondary | ICD-10-CM | POA: Diagnosis not present

## 2014-07-11 DIAGNOSIS — Z09 Encounter for follow-up examination after completed treatment for conditions other than malignant neoplasm: Secondary | ICD-10-CM | POA: Diagnosis not present

## 2014-07-11 DIAGNOSIS — Z5111 Encounter for antineoplastic chemotherapy: Secondary | ICD-10-CM | POA: Diagnosis not present

## 2014-07-11 DIAGNOSIS — C787 Secondary malignant neoplasm of liver and intrahepatic bile duct: Secondary | ICD-10-CM | POA: Diagnosis not present

## 2014-07-11 DIAGNOSIS — C50912 Malignant neoplasm of unspecified site of left female breast: Secondary | ICD-10-CM | POA: Diagnosis not present

## 2014-07-11 DIAGNOSIS — J189 Pneumonia, unspecified organism: Secondary | ICD-10-CM | POA: Diagnosis not present

## 2014-07-23 DIAGNOSIS — M5020 Other cervical disc displacement, unspecified cervical region: Secondary | ICD-10-CM | POA: Diagnosis not present

## 2014-07-23 DIAGNOSIS — M4806 Spinal stenosis, lumbar region: Secondary | ICD-10-CM | POA: Diagnosis not present

## 2014-08-01 DIAGNOSIS — M810 Age-related osteoporosis without current pathological fracture: Secondary | ICD-10-CM | POA: Diagnosis not present

## 2014-08-01 DIAGNOSIS — C787 Secondary malignant neoplasm of liver and intrahepatic bile duct: Secondary | ICD-10-CM | POA: Diagnosis not present

## 2014-08-01 DIAGNOSIS — C50912 Malignant neoplasm of unspecified site of left female breast: Secondary | ICD-10-CM | POA: Diagnosis not present

## 2014-08-11 DIAGNOSIS — M5137 Other intervertebral disc degeneration, lumbosacral region: Secondary | ICD-10-CM | POA: Diagnosis not present

## 2014-08-11 DIAGNOSIS — E039 Hypothyroidism, unspecified: Secondary | ICD-10-CM | POA: Diagnosis not present

## 2014-08-11 DIAGNOSIS — F329 Major depressive disorder, single episode, unspecified: Secondary | ICD-10-CM | POA: Diagnosis not present

## 2014-08-29 DIAGNOSIS — M545 Low back pain: Secondary | ICD-10-CM | POA: Diagnosis not present

## 2014-08-29 DIAGNOSIS — C50912 Malignant neoplasm of unspecified site of left female breast: Secondary | ICD-10-CM | POA: Diagnosis not present

## 2014-08-29 DIAGNOSIS — M810 Age-related osteoporosis without current pathological fracture: Secondary | ICD-10-CM | POA: Diagnosis not present

## 2014-08-29 DIAGNOSIS — R209 Unspecified disturbances of skin sensation: Secondary | ICD-10-CM | POA: Diagnosis not present

## 2014-08-29 DIAGNOSIS — C787 Secondary malignant neoplasm of liver and intrahepatic bile duct: Secondary | ICD-10-CM | POA: Diagnosis not present

## 2014-09-05 DIAGNOSIS — R040 Epistaxis: Secondary | ICD-10-CM | POA: Diagnosis not present

## 2014-09-05 DIAGNOSIS — J342 Deviated nasal septum: Secondary | ICD-10-CM | POA: Diagnosis not present

## 2014-09-19 DIAGNOSIS — M810 Age-related osteoporosis without current pathological fracture: Secondary | ICD-10-CM | POA: Diagnosis not present

## 2014-09-19 DIAGNOSIS — C787 Secondary malignant neoplasm of liver and intrahepatic bile duct: Secondary | ICD-10-CM | POA: Diagnosis not present

## 2014-09-19 DIAGNOSIS — C50912 Malignant neoplasm of unspecified site of left female breast: Secondary | ICD-10-CM | POA: Diagnosis not present

## 2014-10-10 DIAGNOSIS — Z5111 Encounter for antineoplastic chemotherapy: Secondary | ICD-10-CM | POA: Diagnosis not present

## 2014-10-10 DIAGNOSIS — C50912 Malignant neoplasm of unspecified site of left female breast: Secondary | ICD-10-CM | POA: Diagnosis not present

## 2014-10-10 DIAGNOSIS — M810 Age-related osteoporosis without current pathological fracture: Secondary | ICD-10-CM | POA: Diagnosis not present

## 2014-10-10 DIAGNOSIS — C787 Secondary malignant neoplasm of liver and intrahepatic bile duct: Secondary | ICD-10-CM | POA: Diagnosis not present

## 2014-10-31 DIAGNOSIS — M810 Age-related osteoporosis without current pathological fracture: Secondary | ICD-10-CM | POA: Diagnosis not present

## 2014-10-31 DIAGNOSIS — C787 Secondary malignant neoplasm of liver and intrahepatic bile duct: Secondary | ICD-10-CM | POA: Diagnosis not present

## 2014-10-31 DIAGNOSIS — C50912 Malignant neoplasm of unspecified site of left female breast: Secondary | ICD-10-CM | POA: Diagnosis not present

## 2014-10-31 DIAGNOSIS — Z5111 Encounter for antineoplastic chemotherapy: Secondary | ICD-10-CM | POA: Diagnosis not present

## 2014-11-11 DIAGNOSIS — M542 Cervicalgia: Secondary | ICD-10-CM | POA: Diagnosis not present

## 2014-11-11 DIAGNOSIS — S299XXA Unspecified injury of thorax, initial encounter: Secondary | ICD-10-CM | POA: Diagnosis not present

## 2014-11-11 DIAGNOSIS — M546 Pain in thoracic spine: Secondary | ICD-10-CM | POA: Diagnosis not present

## 2014-11-11 DIAGNOSIS — S4992XA Unspecified injury of left shoulder and upper arm, initial encounter: Secondary | ICD-10-CM | POA: Diagnosis not present

## 2014-11-11 DIAGNOSIS — R0789 Other chest pain: Secondary | ICD-10-CM | POA: Diagnosis not present

## 2014-11-11 DIAGNOSIS — S199XXA Unspecified injury of neck, initial encounter: Secondary | ICD-10-CM | POA: Diagnosis not present

## 2014-11-11 DIAGNOSIS — S2242XA Multiple fractures of ribs, left side, initial encounter for closed fracture: Secondary | ICD-10-CM | POA: Diagnosis not present

## 2014-11-11 DIAGNOSIS — M25512 Pain in left shoulder: Secondary | ICD-10-CM | POA: Diagnosis not present

## 2014-11-11 DIAGNOSIS — M47892 Other spondylosis, cervical region: Secondary | ICD-10-CM | POA: Diagnosis not present

## 2014-11-20 DIAGNOSIS — R609 Edema, unspecified: Secondary | ICD-10-CM | POA: Diagnosis not present

## 2014-11-20 DIAGNOSIS — R0602 Shortness of breath: Secondary | ICD-10-CM | POA: Diagnosis not present

## 2014-11-20 DIAGNOSIS — J189 Pneumonia, unspecified organism: Secondary | ICD-10-CM | POA: Diagnosis not present

## 2014-11-21 DIAGNOSIS — Z5111 Encounter for antineoplastic chemotherapy: Secondary | ICD-10-CM | POA: Diagnosis not present

## 2014-11-21 DIAGNOSIS — M810 Age-related osteoporosis without current pathological fracture: Secondary | ICD-10-CM | POA: Diagnosis not present

## 2014-11-21 DIAGNOSIS — C50912 Malignant neoplasm of unspecified site of left female breast: Secondary | ICD-10-CM | POA: Diagnosis not present

## 2014-11-21 DIAGNOSIS — C787 Secondary malignant neoplasm of liver and intrahepatic bile duct: Secondary | ICD-10-CM | POA: Diagnosis not present

## 2014-11-24 DIAGNOSIS — R600 Localized edema: Secondary | ICD-10-CM | POA: Diagnosis not present

## 2014-11-24 DIAGNOSIS — Z853 Personal history of malignant neoplasm of breast: Secondary | ICD-10-CM | POA: Diagnosis not present

## 2014-11-24 DIAGNOSIS — M79604 Pain in right leg: Secondary | ICD-10-CM | POA: Diagnosis not present

## 2014-11-24 DIAGNOSIS — M79605 Pain in left leg: Secondary | ICD-10-CM | POA: Diagnosis not present

## 2014-12-01 DIAGNOSIS — M25462 Effusion, left knee: Secondary | ICD-10-CM | POA: Diagnosis not present

## 2014-12-01 DIAGNOSIS — S8992XA Unspecified injury of left lower leg, initial encounter: Secondary | ICD-10-CM | POA: Diagnosis not present

## 2014-12-01 DIAGNOSIS — M25562 Pain in left knee: Secondary | ICD-10-CM | POA: Diagnosis not present

## 2014-12-12 DIAGNOSIS — Z5111 Encounter for antineoplastic chemotherapy: Secondary | ICD-10-CM | POA: Diagnosis not present

## 2014-12-12 DIAGNOSIS — C787 Secondary malignant neoplasm of liver and intrahepatic bile duct: Secondary | ICD-10-CM | POA: Diagnosis not present

## 2014-12-12 DIAGNOSIS — C50912 Malignant neoplasm of unspecified site of left female breast: Secondary | ICD-10-CM | POA: Diagnosis not present

## 2014-12-12 DIAGNOSIS — M810 Age-related osteoporosis without current pathological fracture: Secondary | ICD-10-CM | POA: Diagnosis not present

## 2014-12-18 DIAGNOSIS — M542 Cervicalgia: Secondary | ICD-10-CM | POA: Diagnosis not present

## 2015-01-02 DIAGNOSIS — C50912 Malignant neoplasm of unspecified site of left female breast: Secondary | ICD-10-CM | POA: Diagnosis not present

## 2015-01-02 DIAGNOSIS — Z5111 Encounter for antineoplastic chemotherapy: Secondary | ICD-10-CM | POA: Diagnosis not present

## 2015-01-02 DIAGNOSIS — M810 Age-related osteoporosis without current pathological fracture: Secondary | ICD-10-CM | POA: Diagnosis not present

## 2015-01-02 DIAGNOSIS — C787 Secondary malignant neoplasm of liver and intrahepatic bile duct: Secondary | ICD-10-CM | POA: Diagnosis not present

## 2015-01-06 DIAGNOSIS — L814 Other melanin hyperpigmentation: Secondary | ICD-10-CM | POA: Diagnosis not present

## 2015-01-06 DIAGNOSIS — D1801 Hemangioma of skin and subcutaneous tissue: Secondary | ICD-10-CM | POA: Diagnosis not present

## 2015-01-06 DIAGNOSIS — L570 Actinic keratosis: Secondary | ICD-10-CM | POA: Diagnosis not present

## 2015-01-23 DIAGNOSIS — C50912 Malignant neoplasm of unspecified site of left female breast: Secondary | ICD-10-CM | POA: Diagnosis not present

## 2015-01-23 DIAGNOSIS — M810 Age-related osteoporosis without current pathological fracture: Secondary | ICD-10-CM | POA: Diagnosis not present

## 2015-01-23 DIAGNOSIS — Z5111 Encounter for antineoplastic chemotherapy: Secondary | ICD-10-CM | POA: Diagnosis not present

## 2015-01-23 DIAGNOSIS — C787 Secondary malignant neoplasm of liver and intrahepatic bile duct: Secondary | ICD-10-CM | POA: Diagnosis not present

## 2015-02-12 DIAGNOSIS — G8929 Other chronic pain: Secondary | ICD-10-CM | POA: Diagnosis not present

## 2015-02-13 DIAGNOSIS — R040 Epistaxis: Secondary | ICD-10-CM | POA: Diagnosis not present

## 2015-02-13 DIAGNOSIS — J342 Deviated nasal septum: Secondary | ICD-10-CM | POA: Diagnosis not present

## 2015-02-13 DIAGNOSIS — M810 Age-related osteoporosis without current pathological fracture: Secondary | ICD-10-CM | POA: Diagnosis not present

## 2015-02-13 DIAGNOSIS — C787 Secondary malignant neoplasm of liver and intrahepatic bile duct: Secondary | ICD-10-CM | POA: Diagnosis not present

## 2015-02-13 DIAGNOSIS — C50912 Malignant neoplasm of unspecified site of left female breast: Secondary | ICD-10-CM | POA: Diagnosis not present

## 2015-02-13 DIAGNOSIS — C7951 Secondary malignant neoplasm of bone: Secondary | ICD-10-CM | POA: Diagnosis not present

## 2015-02-19 DIAGNOSIS — R040 Epistaxis: Secondary | ICD-10-CM | POA: Diagnosis not present

## 2015-03-05 DIAGNOSIS — Z01818 Encounter for other preprocedural examination: Secondary | ICD-10-CM | POA: Diagnosis not present

## 2015-03-05 DIAGNOSIS — C50912 Malignant neoplasm of unspecified site of left female breast: Secondary | ICD-10-CM | POA: Diagnosis not present

## 2015-03-05 DIAGNOSIS — I351 Nonrheumatic aortic (valve) insufficiency: Secondary | ICD-10-CM | POA: Diagnosis not present

## 2015-03-05 DIAGNOSIS — I350 Nonrheumatic aortic (valve) stenosis: Secondary | ICD-10-CM | POA: Diagnosis not present

## 2015-03-06 DIAGNOSIS — Z5111 Encounter for antineoplastic chemotherapy: Secondary | ICD-10-CM | POA: Diagnosis not present

## 2015-03-06 DIAGNOSIS — C787 Secondary malignant neoplasm of liver and intrahepatic bile duct: Secondary | ICD-10-CM | POA: Diagnosis not present

## 2015-03-06 DIAGNOSIS — C50912 Malignant neoplasm of unspecified site of left female breast: Secondary | ICD-10-CM | POA: Diagnosis not present

## 2015-03-11 DIAGNOSIS — R935 Abnormal findings on diagnostic imaging of other abdominal regions, including retroperitoneum: Secondary | ICD-10-CM | POA: Diagnosis not present

## 2015-03-11 DIAGNOSIS — R918 Other nonspecific abnormal finding of lung field: Secondary | ICD-10-CM | POA: Diagnosis not present

## 2015-03-11 DIAGNOSIS — C787 Secondary malignant neoplasm of liver and intrahepatic bile duct: Secondary | ICD-10-CM | POA: Diagnosis not present

## 2015-03-11 DIAGNOSIS — C50912 Malignant neoplasm of unspecified site of left female breast: Secondary | ICD-10-CM | POA: Diagnosis not present

## 2015-03-11 DIAGNOSIS — C50919 Malignant neoplasm of unspecified site of unspecified female breast: Secondary | ICD-10-CM | POA: Diagnosis not present

## 2015-03-11 DIAGNOSIS — Z9189 Other specified personal risk factors, not elsewhere classified: Secondary | ICD-10-CM | POA: Diagnosis not present

## 2015-03-17 IMAGING — CR DG CHEST 2V
2 series · 2 of 2 positions shown · non-contrast
Comparison: 04/10/2007.

CLINICAL DATA: 68-year-old female preoperative study for cervical
spine surgery. Initial encounter.

EXAM:
CHEST  2 VIEW

[w chest pa]
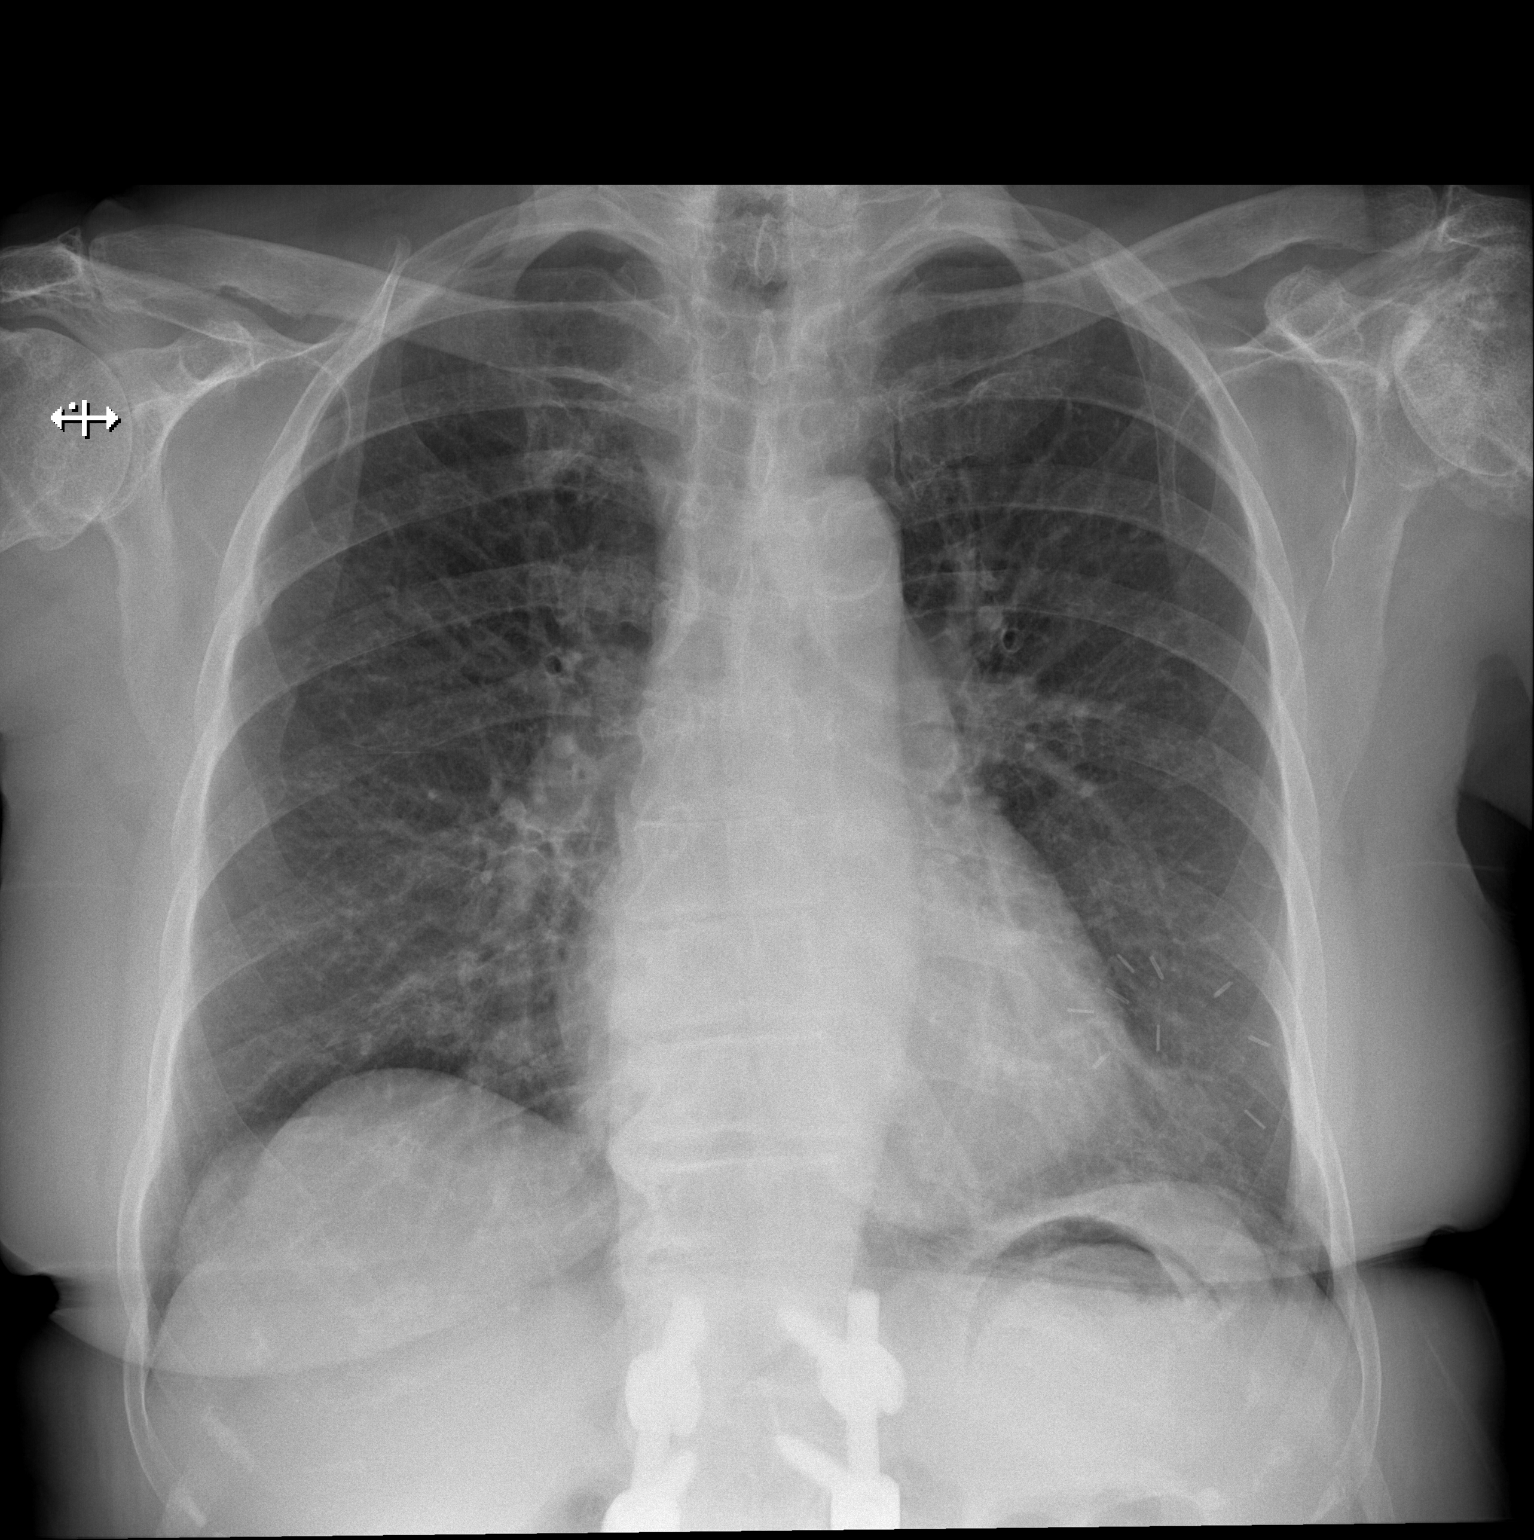

[w chest lat]
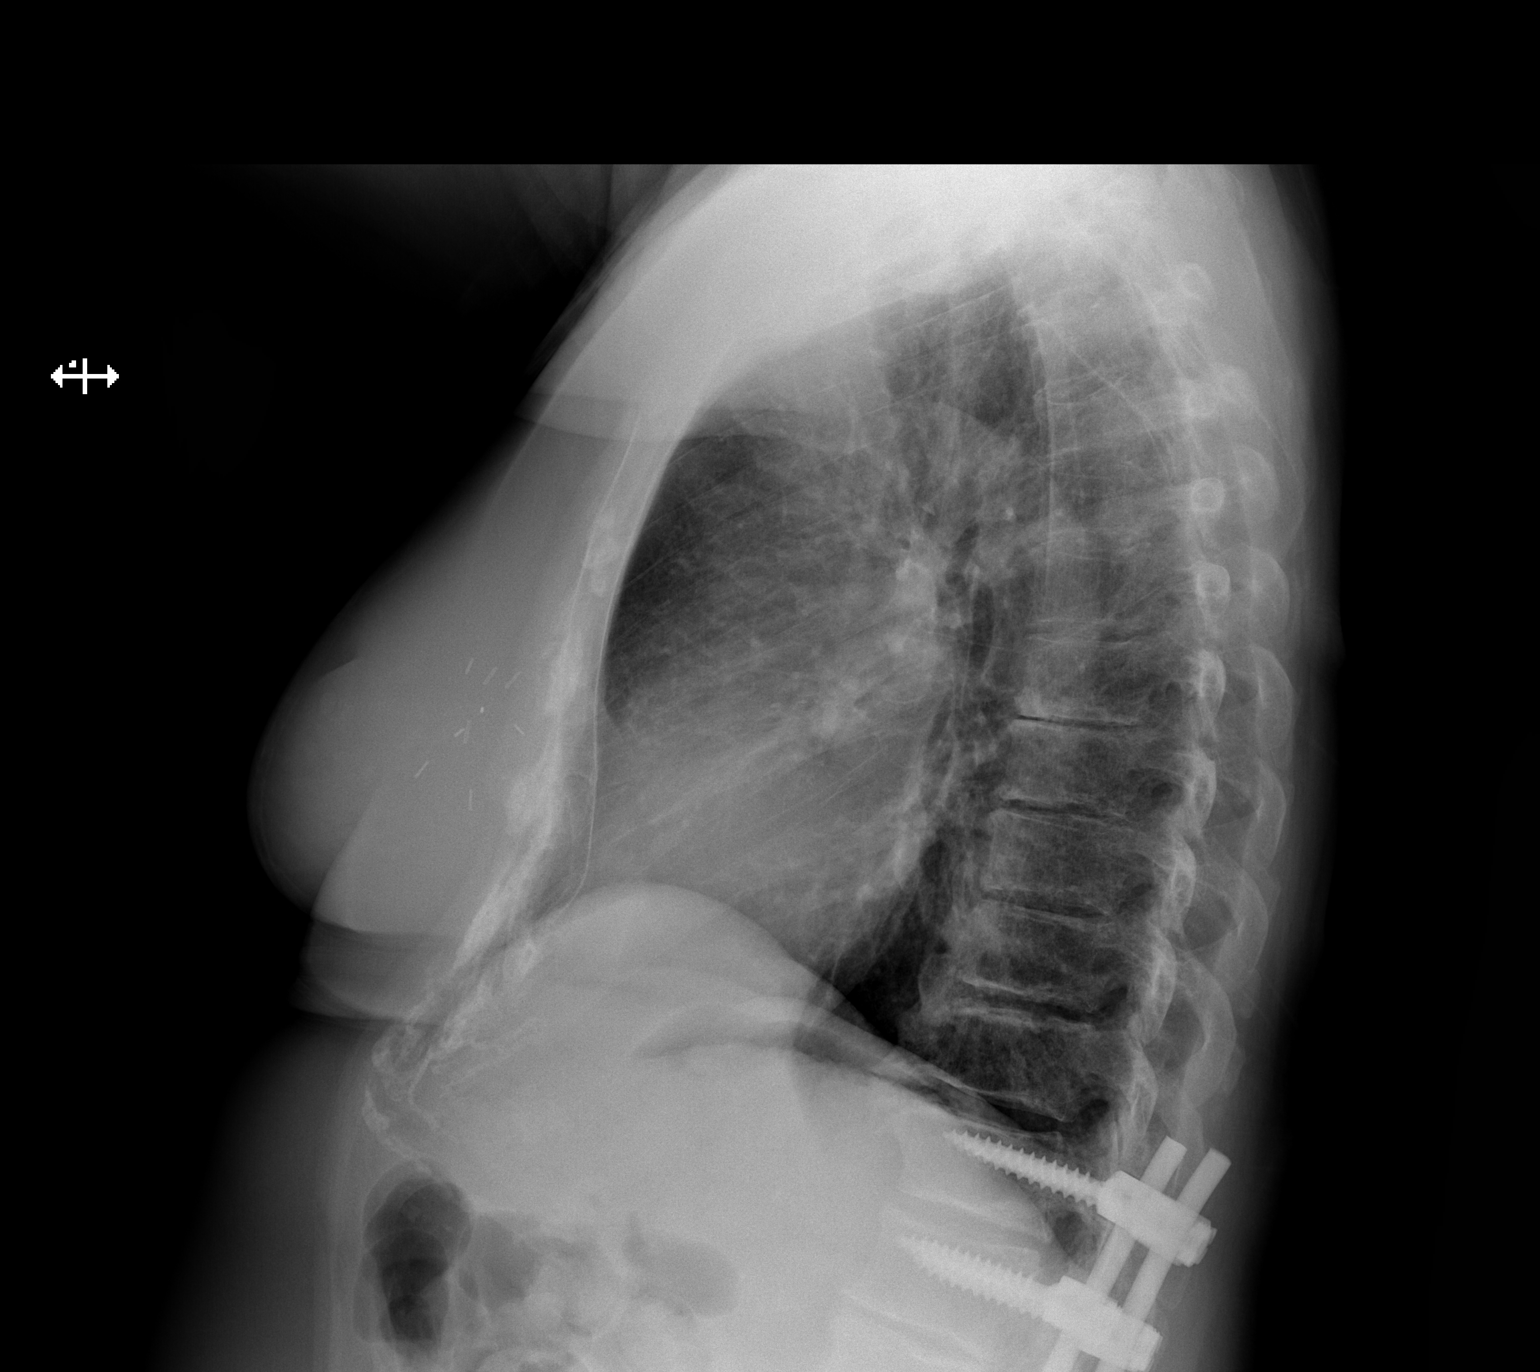

[2 of 2 positions shown; findings below may reference images not displayed]

FINDINGS: Interval lumbar fusion hardware placement. Stable lung volumes, mild
eventration of the right hemidiaphragm. Stable cardiac size at the
upper limits of normal. Other mediastinal contours are within normal
limits. Visualized tracheal air column is within normal limits.
Chronic postoperative changes to the left breast. No pneumothorax,
pulmonary edema, pleural effusion or acute pulmonary opacity. Very
advanced and progressed degenerative changes at the left
glenohumeral joint.
IMPRESSION: No acute cardiopulmonary abnormality.

## 2015-03-27 DIAGNOSIS — Z5111 Encounter for antineoplastic chemotherapy: Secondary | ICD-10-CM | POA: Diagnosis not present

## 2015-03-27 DIAGNOSIS — C787 Secondary malignant neoplasm of liver and intrahepatic bile duct: Secondary | ICD-10-CM | POA: Diagnosis not present

## 2015-03-27 DIAGNOSIS — C50912 Malignant neoplasm of unspecified site of left female breast: Secondary | ICD-10-CM | POA: Diagnosis not present

## 2015-04-17 DIAGNOSIS — C787 Secondary malignant neoplasm of liver and intrahepatic bile duct: Secondary | ICD-10-CM | POA: Diagnosis not present

## 2015-04-17 DIAGNOSIS — Z5111 Encounter for antineoplastic chemotherapy: Secondary | ICD-10-CM | POA: Diagnosis not present

## 2015-04-17 DIAGNOSIS — C50912 Malignant neoplasm of unspecified site of left female breast: Secondary | ICD-10-CM | POA: Diagnosis not present

## 2015-05-05 DIAGNOSIS — H26493 Other secondary cataract, bilateral: Secondary | ICD-10-CM | POA: Diagnosis not present

## 2015-05-07 ENCOUNTER — Other Ambulatory Visit: Payer: Self-pay

## 2015-05-07 DIAGNOSIS — Z1231 Encounter for screening mammogram for malignant neoplasm of breast: Secondary | ICD-10-CM

## 2015-05-08 DIAGNOSIS — C787 Secondary malignant neoplasm of liver and intrahepatic bile duct: Secondary | ICD-10-CM | POA: Diagnosis not present

## 2015-05-08 DIAGNOSIS — Z5111 Encounter for antineoplastic chemotherapy: Secondary | ICD-10-CM | POA: Diagnosis not present

## 2015-05-08 DIAGNOSIS — C50912 Malignant neoplasm of unspecified site of left female breast: Secondary | ICD-10-CM | POA: Diagnosis not present

## 2015-05-14 DIAGNOSIS — M5137 Other intervertebral disc degeneration, lumbosacral region: Secondary | ICD-10-CM | POA: Diagnosis not present

## 2015-05-14 DIAGNOSIS — E039 Hypothyroidism, unspecified: Secondary | ICD-10-CM | POA: Diagnosis not present

## 2015-05-14 DIAGNOSIS — E785 Hyperlipidemia, unspecified: Secondary | ICD-10-CM | POA: Diagnosis not present

## 2015-05-18 ENCOUNTER — Ambulatory Visit: Payer: BLUE CROSS/BLUE SHIELD

## 2015-05-21 DIAGNOSIS — Z23 Encounter for immunization: Secondary | ICD-10-CM | POA: Diagnosis not present

## 2015-05-29 DIAGNOSIS — C787 Secondary malignant neoplasm of liver and intrahepatic bile duct: Secondary | ICD-10-CM | POA: Diagnosis not present

## 2015-05-29 DIAGNOSIS — Z5111 Encounter for antineoplastic chemotherapy: Secondary | ICD-10-CM | POA: Diagnosis not present

## 2015-05-29 DIAGNOSIS — C50912 Malignant neoplasm of unspecified site of left female breast: Secondary | ICD-10-CM | POA: Diagnosis not present

## 2015-06-19 DIAGNOSIS — Z5111 Encounter for antineoplastic chemotherapy: Secondary | ICD-10-CM | POA: Diagnosis not present

## 2015-06-19 DIAGNOSIS — C787 Secondary malignant neoplasm of liver and intrahepatic bile duct: Secondary | ICD-10-CM | POA: Diagnosis not present

## 2015-06-19 DIAGNOSIS — C50912 Malignant neoplasm of unspecified site of left female breast: Secondary | ICD-10-CM | POA: Diagnosis not present

## 2015-06-23 DIAGNOSIS — M25511 Pain in right shoulder: Secondary | ICD-10-CM | POA: Diagnosis not present

## 2015-06-23 DIAGNOSIS — S2231XA Fracture of one rib, right side, initial encounter for closed fracture: Secondary | ICD-10-CM | POA: Diagnosis not present

## 2015-07-01 DIAGNOSIS — Z23 Encounter for immunization: Secondary | ICD-10-CM | POA: Diagnosis not present

## 2015-07-10 DIAGNOSIS — C787 Secondary malignant neoplasm of liver and intrahepatic bile duct: Secondary | ICD-10-CM | POA: Diagnosis not present

## 2015-07-10 DIAGNOSIS — Z5111 Encounter for antineoplastic chemotherapy: Secondary | ICD-10-CM | POA: Diagnosis not present

## 2015-07-10 DIAGNOSIS — C50912 Malignant neoplasm of unspecified site of left female breast: Secondary | ICD-10-CM | POA: Diagnosis not present

## 2015-07-20 ENCOUNTER — Ambulatory Visit
Admission: RE | Admit: 2015-07-20 | Discharge: 2015-07-20 | Disposition: A | Discharge status: Home / Self Care | Payer: Medicare Other | Source: Ambulatory Visit

## 2015-07-20 ENCOUNTER — Ambulatory Visit: Payer: BLUE CROSS/BLUE SHIELD

## 2015-07-20 DIAGNOSIS — Z1231 Encounter for screening mammogram for malignant neoplasm of breast: Secondary | ICD-10-CM

## 2015-07-23 DIAGNOSIS — L853 Xerosis cutis: Secondary | ICD-10-CM | POA: Diagnosis not present

## 2015-07-23 DIAGNOSIS — L219 Seborrheic dermatitis, unspecified: Secondary | ICD-10-CM | POA: Diagnosis not present

## 2015-07-23 DIAGNOSIS — B079 Viral wart, unspecified: Secondary | ICD-10-CM | POA: Diagnosis not present

## 2015-07-24 DIAGNOSIS — S2231XD Fracture of one rib, right side, subsequent encounter for fracture with routine healing: Secondary | ICD-10-CM | POA: Diagnosis not present

## 2015-07-24 DIAGNOSIS — M542 Cervicalgia: Secondary | ICD-10-CM | POA: Diagnosis not present

## 2015-07-24 DIAGNOSIS — M25511 Pain in right shoulder: Secondary | ICD-10-CM | POA: Diagnosis not present

## 2015-07-31 DIAGNOSIS — C50919 Malignant neoplasm of unspecified site of unspecified female breast: Secondary | ICD-10-CM | POA: Diagnosis not present

## 2015-07-31 DIAGNOSIS — C787 Secondary malignant neoplasm of liver and intrahepatic bile duct: Secondary | ICD-10-CM | POA: Diagnosis not present

## 2015-08-06 DIAGNOSIS — E785 Hyperlipidemia, unspecified: Secondary | ICD-10-CM | POA: Diagnosis not present

## 2015-08-06 DIAGNOSIS — E039 Hypothyroidism, unspecified: Secondary | ICD-10-CM | POA: Diagnosis not present

## 2015-08-06 DIAGNOSIS — M5137 Other intervertebral disc degeneration, lumbosacral region: Secondary | ICD-10-CM | POA: Diagnosis not present

## 2015-08-06 DIAGNOSIS — F329 Major depressive disorder, single episode, unspecified: Secondary | ICD-10-CM | POA: Diagnosis not present

## 2015-08-21 DIAGNOSIS — C50912 Malignant neoplasm of unspecified site of left female breast: Secondary | ICD-10-CM | POA: Diagnosis not present

## 2015-08-21 DIAGNOSIS — C787 Secondary malignant neoplasm of liver and intrahepatic bile duct: Secondary | ICD-10-CM | POA: Diagnosis not present

## 2015-08-21 DIAGNOSIS — Z5111 Encounter for antineoplastic chemotherapy: Secondary | ICD-10-CM | POA: Diagnosis not present

## 2015-09-11 DIAGNOSIS — Z803 Family history of malignant neoplasm of breast: Secondary | ICD-10-CM

## 2015-09-11 DIAGNOSIS — C787 Secondary malignant neoplasm of liver and intrahepatic bile duct: Secondary | ICD-10-CM

## 2015-09-11 DIAGNOSIS — Z170 Estrogen receptor positive status [ER+]: Secondary | ICD-10-CM

## 2015-09-11 DIAGNOSIS — Z8542 Personal history of malignant neoplasm of other parts of uterus: Secondary | ICD-10-CM

## 2015-09-11 DIAGNOSIS — M810 Age-related osteoporosis without current pathological fracture: Secondary | ICD-10-CM

## 2015-09-11 DIAGNOSIS — C773 Secondary and unspecified malignant neoplasm of axilla and upper limb lymph nodes: Secondary | ICD-10-CM

## 2015-09-11 DIAGNOSIS — C50919 Malignant neoplasm of unspecified site of unspecified female breast: Secondary | ICD-10-CM

## 2015-09-11 DIAGNOSIS — C7800 Secondary malignant neoplasm of unspecified lung: Secondary | ICD-10-CM | POA: Diagnosis not present

## 2015-09-11 DIAGNOSIS — Z808 Family history of malignant neoplasm of other organs or systems: Secondary | ICD-10-CM

## 2015-10-02 DIAGNOSIS — Z5111 Encounter for antineoplastic chemotherapy: Secondary | ICD-10-CM | POA: Diagnosis not present

## 2015-10-02 DIAGNOSIS — C50919 Malignant neoplasm of unspecified site of unspecified female breast: Secondary | ICD-10-CM | POA: Diagnosis not present

## 2015-10-02 DIAGNOSIS — C787 Secondary malignant neoplasm of liver and intrahepatic bile duct: Secondary | ICD-10-CM | POA: Diagnosis not present

## 2015-10-02 DIAGNOSIS — C7800 Secondary malignant neoplasm of unspecified lung: Secondary | ICD-10-CM | POA: Diagnosis not present

## 2015-10-23 DIAGNOSIS — C787 Secondary malignant neoplasm of liver and intrahepatic bile duct: Secondary | ICD-10-CM | POA: Diagnosis not present

## 2015-10-23 DIAGNOSIS — C50919 Malignant neoplasm of unspecified site of unspecified female breast: Secondary | ICD-10-CM | POA: Diagnosis not present

## 2015-10-23 DIAGNOSIS — Z5111 Encounter for antineoplastic chemotherapy: Secondary | ICD-10-CM | POA: Diagnosis not present

## 2015-11-04 DIAGNOSIS — Z853 Personal history of malignant neoplasm of breast: Secondary | ICD-10-CM | POA: Diagnosis not present

## 2015-11-04 DIAGNOSIS — Z8049 Family history of malignant neoplasm of other genital organs: Secondary | ICD-10-CM | POA: Diagnosis not present

## 2015-11-04 DIAGNOSIS — Z800 Family history of malignant neoplasm of digestive organs: Secondary | ICD-10-CM | POA: Diagnosis not present

## 2015-11-04 DIAGNOSIS — Z8542 Personal history of malignant neoplasm of other parts of uterus: Secondary | ICD-10-CM | POA: Diagnosis not present

## 2015-11-13 DIAGNOSIS — C50912 Malignant neoplasm of unspecified site of left female breast: Secondary | ICD-10-CM | POA: Diagnosis not present

## 2015-11-13 DIAGNOSIS — Z5111 Encounter for antineoplastic chemotherapy: Secondary | ICD-10-CM | POA: Diagnosis not present

## 2015-12-03 DIAGNOSIS — C50912 Malignant neoplasm of unspecified site of left female breast: Secondary | ICD-10-CM | POA: Diagnosis not present

## 2015-12-03 DIAGNOSIS — Z5111 Encounter for antineoplastic chemotherapy: Secondary | ICD-10-CM | POA: Diagnosis not present

## 2015-12-09 ENCOUNTER — Ambulatory Visit: Payer: Medicare Other | Admitting: Sports Medicine

## 2015-12-10 DIAGNOSIS — R5383 Other fatigue: Secondary | ICD-10-CM | POA: Diagnosis not present

## 2015-12-10 DIAGNOSIS — E039 Hypothyroidism, unspecified: Secondary | ICD-10-CM | POA: Diagnosis not present

## 2015-12-10 DIAGNOSIS — M5137 Other intervertebral disc degeneration, lumbosacral region: Secondary | ICD-10-CM | POA: Diagnosis not present

## 2015-12-10 DIAGNOSIS — I10 Essential (primary) hypertension: Secondary | ICD-10-CM | POA: Diagnosis not present

## 2015-12-10 DIAGNOSIS — M810 Age-related osteoporosis without current pathological fracture: Secondary | ICD-10-CM | POA: Diagnosis not present

## 2015-12-21 DIAGNOSIS — Z5111 Encounter for antineoplastic chemotherapy: Secondary | ICD-10-CM | POA: Diagnosis not present

## 2015-12-21 DIAGNOSIS — C50919 Malignant neoplasm of unspecified site of unspecified female breast: Secondary | ICD-10-CM | POA: Diagnosis not present

## 2015-12-21 DIAGNOSIS — J342 Deviated nasal septum: Secondary | ICD-10-CM | POA: Diagnosis not present

## 2015-12-21 DIAGNOSIS — R040 Epistaxis: Secondary | ICD-10-CM | POA: Diagnosis not present

## 2015-12-25 DIAGNOSIS — C787 Secondary malignant neoplasm of liver and intrahepatic bile duct: Secondary | ICD-10-CM | POA: Diagnosis not present

## 2015-12-25 DIAGNOSIS — Z5111 Encounter for antineoplastic chemotherapy: Secondary | ICD-10-CM | POA: Diagnosis not present

## 2015-12-25 DIAGNOSIS — C7800 Secondary malignant neoplasm of unspecified lung: Secondary | ICD-10-CM | POA: Diagnosis not present

## 2015-12-25 DIAGNOSIS — C50919 Malignant neoplasm of unspecified site of unspecified female breast: Secondary | ICD-10-CM | POA: Diagnosis not present

## 2015-12-30 ENCOUNTER — Encounter: Payer: Self-pay | Admitting: Sports Medicine

## 2015-12-30 ENCOUNTER — Ambulatory Visit (INDEPENDENT_AMBULATORY_CARE_PROVIDER_SITE_OTHER): Payer: Medicare Other | Admitting: Sports Medicine

## 2015-12-30 DIAGNOSIS — M79674 Pain in right toe(s): Secondary | ICD-10-CM

## 2015-12-30 DIAGNOSIS — G629 Polyneuropathy, unspecified: Secondary | ICD-10-CM | POA: Diagnosis not present

## 2015-12-30 DIAGNOSIS — B351 Tinea unguium: Secondary | ICD-10-CM

## 2015-12-30 DIAGNOSIS — R2681 Unsteadiness on feet: Secondary | ICD-10-CM | POA: Diagnosis not present

## 2015-12-30 DIAGNOSIS — L84 Corns and callosities: Secondary | ICD-10-CM | POA: Diagnosis not present

## 2015-12-30 DIAGNOSIS — M79675 Pain in left toe(s): Secondary | ICD-10-CM

## 2015-12-30 DIAGNOSIS — L608 Other nail disorders: Secondary | ICD-10-CM | POA: Diagnosis not present

## 2015-12-30 DIAGNOSIS — L603 Nail dystrophy: Secondary | ICD-10-CM | POA: Diagnosis not present

## 2015-12-30 NOTE — Progress Notes (Signed)
Patient ID: NADINE RYLE, female   DOB: 1946/07/07, 70 y.o.   MRN: [id number] Subjective: CAYTLYN EVERS is a 70 y.o. female patient seen today in office with complaint of painful thickened and elongated toenails; unable to trim and calluses that are painful as well. Reports that she tends to trip and fall a lot and she has a history of neuropathy and is concerned mostly of the callus and the nail changes at the right first toe. States that the callus possibly loose and is wondering what she should she do about any possible fungus. Patient denies history of Diabetes or Vascular disease. Patient has no other pedal complaints at this time.   Patient Active Problem List   Diagnosis Date Noted  . HCAP (healthcare-associated pneumonia) 05/09/2014  . Acute respiratory failure with hypoxia ([place]) 05/09/2014  . Pneumonia 05/09/2014  . Hypertension   . Hypothyroidism   . Breast cancer ([place])   . Cervical stenosis (uterine cervix) 05/07/2014  . Spinal stenosis in cervical region 05/06/2014    Class: Chronic  . Cervical spondylosis without myelopathy 05/06/2014  . HNP (herniated nucleus pulposus), cervical 05/06/2014  . Adult hypothyroidism 05/03/2013  . 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 At risk of disease 03/11/2013  . Primary malignant neoplasm of breast ([place]) 10/24/2002    Current Outpatient Prescriptions on File Prior to Visit  Medication Sig Dispense Refill  . albuterol (PROVENTIL) (2.5 MG/3ML) 0.083% nebulizer solution Take 3 mLs (2.5 mg total) by nebulization every 6 (six) hours as needed for wheezing or shortness of breath. 75 mL 12  . atenolol (TENORMIN) 50 MG tablet Take 12.5 mg by mouth daily as needed (palpitations).     . Calcium Carbonate-Vitamin D (CALTRATE 600+D PO) Take 1 tablet by mouth 2 (two) times daily.    . celecoxib (CELEBREX) 200 MG capsule Take 200 mg by mouth daily.     . cholecalciferol (VITAMIN D) 1000 UNITS tablet Take 1,000 Units by mouth daily.    Marland Kitchen  conjugated estrogens (PREMARIN) vaginal cream Place 0.5 Applicatorfuls vaginally 2 (two) times a week. Rubs on outside.    . diazepam (VALIUM) 10 MG tablet Take 10 mg by mouth at bedtime.     . docusate sodium (COLACE) 100 MG capsule Take 200 mg by mouth 2 (two) times daily as needed for mild constipation.     . gabapentin (NEURONTIN) 600 MG tablet Take 600-1,200 mg by mouth 4 (four) times daily - after meals and at bedtime. 1 tablet (600 mg) 3 times a day and 2 tablets('1200mg'$ ) at bedtime    . HYDROcodone-acetaminophen (NORCO) 7.5-325 MG per tablet Take 2 tablets by mouth 2 (two) times daily as needed for moderate pain.     Marland Kitchen levofloxacin (LEVAQUIN) 750 MG tablet Take 1 tablet (750 mg total) by mouth daily. 3 tablet 0  . levothyroxine (SYNTHROID, LEVOTHROID) 50 MCG tablet Take 50 mcg by mouth daily.    . methocarbamol (ROBAXIN) 500 MG tablet Take 500 mg by mouth 4 (four) times daily.    . Multiple Vitamin (MULTIVITAMIN WITH MINERALS) TABS Take 1 tablet by mouth daily.    Marland Kitchen nystatin (MYCOSTATIN) 100000 UNIT/ML suspension Take 5 mLs (500,000 Units total) by mouth 4 (four) times daily. 60 mL 0  . oxyCODONE-acetaminophen (ROXICET) 5-325 MG per tablet Take 1-2 tablets by mouth every 4 (four) hours as needed for severe pain. 60 tablet 0  . promethazine (PHENERGAN) 25 MG tablet Take 25 mg by mouth every 6 (six) hours as needed  for nausea or vomiting.    . senna (SENOKOT) 8.6 MG tablet Take 3-4 tablets by mouth at bedtime.    . trastuzumab (HERCEPTIN) 440 MG injection 440 mg by Intravenous (Continuous Infusion) route every 21 ( twenty-one) days. infusion every 3 weeks    . zolendronic acid (ZOMETA) 4 MG/5ML injection Inject 4 mg into the vein every 6 (six) months.      No current facility-administered medications on file prior to visit.    No Known Allergies  Objective: Physical Exam  General: Well developed, nourished, no acute distress, awake, alert and oriented x 3  Vascular: Dorsalis pedis  artery 1/4 bilateral, Posterior tibial artery 1/4 bilateral, skin temperature warm to Cool proximal to distal bilateral lower extremities, + varicosities, no pedal hair present bilateral.  Neurological: Gross sensation present via light touch bilateral. Protective and epicritic sensation is decreased bilateral. Vibratory sensation is decreased bilateral.  Dermatological: Skin is warm, dry, and supple bilateral, Nails 1-10 are tender, long, thick, and discolored with mild subungal debris with onycholysis and dry blood at the right hallux nail, no webspace macerations present bilateral, no open lesions present bilateral, there is callushyperkeratotic tissue present right first toe distal tuft and left third toe lateral aspect. No signs of infection bilateral.  Musculoskeletal: Hammertoe bony deformities noted bilateral. Muscular strength within normal limits without painon range of motion. No pain with calf compression bilateral.  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  -Discussed treatment options for painful Callus and mycotic nails. -Mechanically debrided callus using sterile chisel blade without incident and reduced mycotic nails with sterile nail nipper and dremel nail file without incident. Fungal culture obtained, right hallux nail and sent to [facility] -Recommend toe cushions and spacers, which was dispensed to patient at today's visit -Recommend good supportive wider shoes to prevent irritation to toes -Continue with rolling walker for stability in gait. Recommended to patient to consider more balance brace in the future -Continue with gabapentin for  nerve pain -Patient to return in 4 weeks for follow up evaluation/discuss fungal culture results or sooner if symptoms worsen.  Landis Martins, DPM

## 2016-01-15 ENCOUNTER — Encounter: Payer: Self-pay | Admitting: Sports Medicine

## 2016-01-15 DIAGNOSIS — C787 Secondary malignant neoplasm of liver and intrahepatic bile duct: Secondary | ICD-10-CM | POA: Diagnosis not present

## 2016-01-15 DIAGNOSIS — C50911 Malignant neoplasm of unspecified site of right female breast: Secondary | ICD-10-CM | POA: Diagnosis not present

## 2016-01-15 DIAGNOSIS — Z5111 Encounter for antineoplastic chemotherapy: Secondary | ICD-10-CM | POA: Diagnosis not present

## 2016-01-27 ENCOUNTER — Ambulatory Visit: Payer: Medicare Other | Admitting: Sports Medicine

## 2016-02-05 DIAGNOSIS — C50912 Malignant neoplasm of unspecified site of left female breast: Secondary | ICD-10-CM | POA: Diagnosis not present

## 2016-02-05 DIAGNOSIS — I081 Rheumatic disorders of both mitral and tricuspid valves: Secondary | ICD-10-CM | POA: Diagnosis not present

## 2016-02-05 DIAGNOSIS — C787 Secondary malignant neoplasm of liver and intrahepatic bile duct: Secondary | ICD-10-CM | POA: Diagnosis not present

## 2016-02-05 DIAGNOSIS — C7800 Secondary malignant neoplasm of unspecified lung: Secondary | ICD-10-CM | POA: Diagnosis not present

## 2016-02-05 DIAGNOSIS — Z5111 Encounter for antineoplastic chemotherapy: Secondary | ICD-10-CM | POA: Diagnosis not present

## 2016-02-10 ENCOUNTER — Telehealth: Payer: Self-pay | Admitting: *Deleted

## 2016-02-10 ENCOUNTER — Ambulatory Visit: Payer: Medicare Other | Admitting: Sports Medicine

## 2016-02-10 NOTE — Telephone Encounter (Addendum)
Pt states she has been viewing MY Chart and she sees her biopsy results are in but can't view them. I told pt the results would have to be released by the doctor, and Dr. Cannon Kettle preferred to discuss the results with pts face-to-face.  Pt states understanding but she's on chemo and doesn't feel like coming in.  I told pt I would inform Dr. Cannon Kettle and call with her decision. Pt states understanding. 02/11/2016-Informed pt of lab results and Dr. Leeanne Rio recommendations. Pt states she'll get the Revitaderm40 from [place] office.

## 2016-02-10 NOTE — Telephone Encounter (Signed)
Can you informed patient that her fungal culture results are negative. The changes to her nails are from microtrauma. The most common explanation for trauma to the nails, most likely can be from the toes hitting the insides of her shoes, causing the nails thicken up and change. There are no good treatment options for microtrauma. However, there is a topical that we could recommend and offer for her to use to the nails, once daily such as nuvail or revitaderm. Advise the patient that it may take over a year of using topicals to the nails to notice any significant difference; topicals only work about 20% of the time. The most important thing for the patient to do is to make sure she is eliminating any type of traumatic forces to her toes. Thus patient should use wider shoes with more space in her toe box and also should consider routine nail trimming and filing the nails down, which can be very beneficial to help nails grow out with a better appearance, especially if she decides to a topical treatment; the thinner the nails are the more likelihood that the medicine will be able to penetrate the nails and make a difference as her nails are growing out thus giving her a better result.  Please prescribe to the patient nuvail or revitaderm if she would like to try this for her nails and she should follow up as needed or after 1 year of treatment.

## 2016-02-26 DIAGNOSIS — C787 Secondary malignant neoplasm of liver and intrahepatic bile duct: Secondary | ICD-10-CM | POA: Diagnosis not present

## 2016-02-26 DIAGNOSIS — C7800 Secondary malignant neoplasm of unspecified lung: Secondary | ICD-10-CM | POA: Diagnosis not present

## 2016-02-26 DIAGNOSIS — C50919 Malignant neoplasm of unspecified site of unspecified female breast: Secondary | ICD-10-CM | POA: Diagnosis not present

## 2016-02-26 DIAGNOSIS — Z5111 Encounter for antineoplastic chemotherapy: Secondary | ICD-10-CM | POA: Diagnosis not present

## 2016-03-04 ENCOUNTER — Other Ambulatory Visit: Payer: Self-pay | Admitting: Oncology

## 2016-03-04 DIAGNOSIS — M810 Age-related osteoporosis without current pathological fracture: Secondary | ICD-10-CM

## 2016-03-11 ENCOUNTER — Inpatient Hospital Stay: Admission: RE | Admit: 2016-03-11 | Payer: Medicare Other | Source: Ambulatory Visit

## 2016-03-15 DIAGNOSIS — Z79899 Other long term (current) drug therapy: Secondary | ICD-10-CM | POA: Diagnosis not present

## 2016-03-15 DIAGNOSIS — R918 Other nonspecific abnormal finding of lung field: Secondary | ICD-10-CM | POA: Diagnosis not present

## 2016-03-15 DIAGNOSIS — E2749 Other adrenocortical insufficiency: Secondary | ICD-10-CM | POA: Diagnosis not present

## 2016-03-15 DIAGNOSIS — M899 Disorder of bone, unspecified: Secondary | ICD-10-CM | POA: Diagnosis not present

## 2016-03-15 DIAGNOSIS — C7800 Secondary malignant neoplasm of unspecified lung: Secondary | ICD-10-CM | POA: Diagnosis not present

## 2016-03-15 DIAGNOSIS — E039 Hypothyroidism, unspecified: Secondary | ICD-10-CM | POA: Diagnosis not present

## 2016-03-15 DIAGNOSIS — K869 Disease of pancreas, unspecified: Secondary | ICD-10-CM | POA: Diagnosis not present

## 2016-03-15 DIAGNOSIS — C787 Secondary malignant neoplasm of liver and intrahepatic bile duct: Secondary | ICD-10-CM | POA: Diagnosis not present

## 2016-03-15 DIAGNOSIS — C50912 Malignant neoplasm of unspecified site of left female breast: Secondary | ICD-10-CM | POA: Diagnosis not present

## 2016-03-18 DIAGNOSIS — C50919 Malignant neoplasm of unspecified site of unspecified female breast: Secondary | ICD-10-CM | POA: Diagnosis not present

## 2016-03-18 DIAGNOSIS — Z5111 Encounter for antineoplastic chemotherapy: Secondary | ICD-10-CM | POA: Diagnosis not present

## 2016-03-18 DIAGNOSIS — C7800 Secondary malignant neoplasm of unspecified lung: Secondary | ICD-10-CM | POA: Diagnosis not present

## 2016-03-18 DIAGNOSIS — C787 Secondary malignant neoplasm of liver and intrahepatic bile duct: Secondary | ICD-10-CM | POA: Diagnosis not present

## 2016-03-21 DIAGNOSIS — M79652 Pain in left thigh: Secondary | ICD-10-CM | POA: Diagnosis not present

## 2016-03-21 DIAGNOSIS — M25552 Pain in left hip: Secondary | ICD-10-CM | POA: Diagnosis not present

## 2016-03-21 DIAGNOSIS — S2231XD Fracture of one rib, right side, subsequent encounter for fracture with routine healing: Secondary | ICD-10-CM | POA: Diagnosis not present

## 2016-03-23 DIAGNOSIS — C50919 Malignant neoplasm of unspecified site of unspecified female breast: Secondary | ICD-10-CM | POA: Diagnosis not present

## 2016-03-23 DIAGNOSIS — Z7901 Long term (current) use of anticoagulants: Secondary | ICD-10-CM | POA: Diagnosis not present

## 2016-03-24 ENCOUNTER — Other Ambulatory Visit: Payer: Self-pay | Admitting: Specialist

## 2016-03-24 DIAGNOSIS — M25552 Pain in left hip: Secondary | ICD-10-CM

## 2016-03-24 DIAGNOSIS — M79652 Pain in left thigh: Secondary | ICD-10-CM

## 2016-04-02 ENCOUNTER — Ambulatory Visit
Admission: RE | Admit: 2016-04-02 | Discharge: 2016-04-02 | Disposition: A | Discharge status: Home / Self Care | Payer: Medicare Other | Source: Ambulatory Visit | Attending: Specialist | Admitting: Specialist

## 2016-04-02 DIAGNOSIS — M25552 Pain in left hip: Secondary | ICD-10-CM

## 2016-04-02 DIAGNOSIS — M79652 Pain in left thigh: Secondary | ICD-10-CM

## 2016-04-04 DIAGNOSIS — M7061 Trochanteric bursitis, right hip: Secondary | ICD-10-CM | POA: Diagnosis not present

## 2016-04-04 DIAGNOSIS — S2231XD Fracture of one rib, right side, subsequent encounter for fracture with routine healing: Secondary | ICD-10-CM | POA: Diagnosis not present

## 2016-04-08 DIAGNOSIS — C50919 Malignant neoplasm of unspecified site of unspecified female breast: Secondary | ICD-10-CM | POA: Diagnosis not present

## 2016-04-08 DIAGNOSIS — C787 Secondary malignant neoplasm of liver and intrahepatic bile duct: Secondary | ICD-10-CM | POA: Diagnosis not present

## 2016-04-08 DIAGNOSIS — Z5111 Encounter for antineoplastic chemotherapy: Secondary | ICD-10-CM | POA: Diagnosis not present

## 2016-04-08 DIAGNOSIS — C7800 Secondary malignant neoplasm of unspecified lung: Secondary | ICD-10-CM | POA: Diagnosis not present

## 2016-04-12 DIAGNOSIS — I10 Essential (primary) hypertension: Secondary | ICD-10-CM | POA: Diagnosis not present

## 2016-04-12 DIAGNOSIS — M5137 Other intervertebral disc degeneration, lumbosacral region: Secondary | ICD-10-CM | POA: Diagnosis not present

## 2016-04-12 DIAGNOSIS — F329 Major depressive disorder, single episode, unspecified: Secondary | ICD-10-CM | POA: Diagnosis not present

## 2016-04-12 DIAGNOSIS — E039 Hypothyroidism, unspecified: Secondary | ICD-10-CM | POA: Diagnosis not present

## 2016-04-13 ENCOUNTER — Ambulatory Visit
Admission: RE | Admit: 2016-04-13 | Discharge: 2016-04-13 | Disposition: A | Discharge status: Home / Self Care | Payer: Medicare Other | Source: Ambulatory Visit | Attending: Oncology | Admitting: Oncology

## 2016-04-13 DIAGNOSIS — M8589 Other specified disorders of bone density and structure, multiple sites: Secondary | ICD-10-CM | POA: Diagnosis not present

## 2016-04-13 DIAGNOSIS — Z780 Asymptomatic menopausal state: Secondary | ICD-10-CM | POA: Diagnosis not present

## 2016-04-13 DIAGNOSIS — M810 Age-related osteoporosis without current pathological fracture: Secondary | ICD-10-CM

## 2016-04-29 DIAGNOSIS — C7800 Secondary malignant neoplasm of unspecified lung: Secondary | ICD-10-CM | POA: Diagnosis not present

## 2016-04-29 DIAGNOSIS — C50912 Malignant neoplasm of unspecified site of left female breast: Secondary | ICD-10-CM | POA: Diagnosis not present

## 2016-04-29 DIAGNOSIS — C787 Secondary malignant neoplasm of liver and intrahepatic bile duct: Secondary | ICD-10-CM | POA: Diagnosis not present

## 2016-05-04 ENCOUNTER — Ambulatory Visit: Payer: Medicare Other | Admitting: Neurology

## 2016-05-10 ENCOUNTER — Ambulatory Visit: Payer: Medicare Other | Admitting: Neurology

## 2016-05-16 ENCOUNTER — Encounter: Payer: Self-pay | Admitting: Neurology

## 2016-05-16 ENCOUNTER — Telehealth: Payer: Self-pay | Admitting: Neurology

## 2016-05-16 NOTE — Telephone Encounter (Signed)
I did not call the patient. Reason for no-show is understandable, but it is still a no-show. OK to waive the rescheduling fee. Second no-show will be a dismissal.

## 2016-05-16 NOTE — Telephone Encounter (Signed)
Pt called in after receiving no show letter in the mail. She states that she missed it due to finding out her grandson was just diagnosed with cancer and it was very traumatic.

## 2016-05-19 DIAGNOSIS — C50911 Malignant neoplasm of unspecified site of right female breast: Secondary | ICD-10-CM | POA: Diagnosis not present

## 2016-05-19 DIAGNOSIS — C787 Secondary malignant neoplasm of liver and intrahepatic bile duct: Secondary | ICD-10-CM | POA: Diagnosis not present

## 2016-05-19 DIAGNOSIS — C7800 Secondary malignant neoplasm of unspecified lung: Secondary | ICD-10-CM | POA: Diagnosis not present

## 2016-05-19 DIAGNOSIS — Z5111 Encounter for antineoplastic chemotherapy: Secondary | ICD-10-CM | POA: Diagnosis not present

## 2016-06-10 DIAGNOSIS — C7800 Secondary malignant neoplasm of unspecified lung: Secondary | ICD-10-CM | POA: Diagnosis not present

## 2016-06-10 DIAGNOSIS — C50911 Malignant neoplasm of unspecified site of right female breast: Secondary | ICD-10-CM | POA: Diagnosis not present

## 2016-06-10 DIAGNOSIS — Z5111 Encounter for antineoplastic chemotherapy: Secondary | ICD-10-CM | POA: Diagnosis not present

## 2016-06-10 DIAGNOSIS — C787 Secondary malignant neoplasm of liver and intrahepatic bile duct: Secondary | ICD-10-CM | POA: Diagnosis not present

## 2016-06-13 DIAGNOSIS — Z23 Encounter for immunization: Secondary | ICD-10-CM | POA: Diagnosis not present

## 2016-06-30 ENCOUNTER — Other Ambulatory Visit: Payer: Self-pay | Admitting: Oncology

## 2016-06-30 DIAGNOSIS — Z1231 Encounter for screening mammogram for malignant neoplasm of breast: Secondary | ICD-10-CM

## 2016-07-01 DIAGNOSIS — C787 Secondary malignant neoplasm of liver and intrahepatic bile duct: Secondary | ICD-10-CM | POA: Diagnosis not present

## 2016-07-01 DIAGNOSIS — C7800 Secondary malignant neoplasm of unspecified lung: Secondary | ICD-10-CM | POA: Diagnosis not present

## 2016-07-01 DIAGNOSIS — C50911 Malignant neoplasm of unspecified site of right female breast: Secondary | ICD-10-CM | POA: Diagnosis not present

## 2016-07-01 DIAGNOSIS — Z5111 Encounter for antineoplastic chemotherapy: Secondary | ICD-10-CM | POA: Diagnosis not present

## 2016-07-05 ENCOUNTER — Ambulatory Visit (INDEPENDENT_AMBULATORY_CARE_PROVIDER_SITE_OTHER): Payer: Medicare Other | Admitting: Neurology

## 2016-07-05 ENCOUNTER — Encounter: Payer: Self-pay | Admitting: Neurology

## 2016-07-05 VITALS — BP 152/68 | HR 48 | Ht 61.5 in | Wt 146.5 lb

## 2016-07-05 DIAGNOSIS — R269 Unspecified abnormalities of gait and mobility: Secondary | ICD-10-CM | POA: Insufficient documentation

## 2016-07-05 DIAGNOSIS — E538 Deficiency of other specified B group vitamins: Secondary | ICD-10-CM

## 2016-07-05 HISTORY — DX: Unspecified abnormalities of gait and mobility: R26.9

## 2016-07-05 NOTE — Patient Instructions (Signed)
   We will get blood work today and get EMG and NCV to evaluate the nerves in the legs. We will get physical therapy to work on the walking.

## 2016-07-05 NOTE — Progress Notes (Signed)
Reason for visit: Gait disorder  Referring physician: Dr. Harvie Bridge is a 70 y.o. female  History of present illness:  [name] is a 70 year old right-handed white female with a history of metastatic breast cancer. The patient has had cervical and lumbar spine surgery in the past. She had surgery she believes around 2012 on her low back at [facility], this was complicated by an epidural hematoma that resulted in a change in her walking that has been persistent since that time. She reports some numbness from the waist level down, she has had some urinary incontinence since the lumbosacral spine surgery. The patient initially had extreme hypersensitivity of the skin in the legs, but this has improved some over time. The patient has more problems with the left leg in the right. She uses a cane for ambulation. At times she may have a tendency to lean backwards. She believes that her ability to walk has worsened slightly over time. The lumbar procedure was from L2-L5 levels. The patient has had a fall in the summer of 2017, she hurt her left hip, but fortunately she did not sustain a fracture. She has fractured a rib secondary to a fall in December 2016. The patient denies any significant numbness or weakness in the arms, she does have some neck discomfort, she denies any headaches. She is sent to this office for an evaluation.  Past Medical History:  Diagnosis Date  . Arthritis   . Blood dyscrasia    bleeds and bruises easily  . Breast cancer ([place])   . Hepatitis     Hep A years ago  . Hypertension   . Hypothyroidism    Graves disease, age 41 yo  . Nose trouble    right nostril will hemorrhage at times  . Pneumonia     Past Surgical History:  Procedure Laterality Date  . ABDOMINAL HYSTERECTOMY     Age 73  . ANTERIOR CERVICAL DECOMP/DISCECTOMY FUSION N/A 05/06/2014   Procedure: ANTERIOR CERVICAL DISCECTOMY FUSION C3-4 with transgraft, local bone graft, plate and  screws;  Surgeon: Jessy Oto, MD;  Location: [facility];  Service: Orthopedics;  Laterality: N/A;  . APPENDECTOMY    . BREAST LUMPECTOMY Left   . COLONOSCOPY    . EYE SURGERY Bilateral    cataracts  . LUMBAR SPINE SURGERY  2011   Removal of hematoma  . ORIF HUMERUS FRACTURE Left 2013  . THORACIC SPINE SURGERY  2011   Bone cages  . THYROIDECTOMY    . TONSILLECTOMY      Family History  Problem Relation Age of Onset  . Congestive Heart Failure Mother   . Heart disease Father   . Tongue cancer Grandchild     Social history:  reports that she quit smoking about 54 years ago. Her smoking use included Cigarettes. She has never used smokeless tobacco. She reports that she drinks alcohol. She reports that she does not use drugs.  Medications:  Prior to Admission medications   Medication Sig Start Date End Date Taking? Authorizing Provider  atenolol (TENORMIN) 25 MG tablet Take 25 mg by mouth every other day.  06/08/16  Yes Historical Provider, MD  B Complex Vitamins (B COMPLEX PO) Take by mouth.   Yes Historical Provider, MD  Calcium Carbonate-Vitamin D (CALTRATE 600+D PO) Take 1 tablet by mouth 2 (two) times daily.   Yes Historical Provider, MD  celecoxib (CELEBREX) 200 MG capsule Take 200 mg by mouth daily.  12/31/10  Yes Historical Provider, MD  conjugated estrogens (PREMARIN) vaginal cream Place 0.5 Applicatorfuls vaginally 2 (two) times a week. Rubs on outside. 01/19/10  Yes Historical Provider, MD  diazepam (VALIUM) 10 MG tablet Take 10 mg by mouth at bedtime.    Yes Historical Provider, MD  docusate sodium (COLACE) 100 MG capsule Take 200 mg by mouth 2 (two) times daily as needed for mild constipation.    Yes Historical Provider, MD  gabapentin (NEURONTIN) 600 MG tablet Take 600-1,200 mg by mouth 4 (four) times daily - after meals and at bedtime. 1 tablet (600 mg) 3 times a day and 2 tablets('1200mg'$ ) at bedtime   Yes Historical Provider, MD  HYDROcodone-acetaminophen (NORCO) 7.5-325 MG  per tablet Take 2 tablets by mouth 2 (two) times daily as needed for moderate pain.    Yes Historical Provider, MD  levofloxacin (LEVAQUIN) 750 MG tablet Take 1 tablet (750 mg total) by mouth daily. 05/13/14  Yes Kinnie Feil, MD  levothyroxine (SYNTHROID, LEVOTHROID) 50 MCG tablet Take 50 mcg by mouth daily.   Yes Historical Provider, MD  methocarbamol (ROBAXIN) 500 MG tablet Take 500 mg by mouth 4 (four) times daily.   Yes Historical Provider, MD  Multiple Vitamin (MULTIVITAMIN WITH MINERALS) TABS Take 1 tablet by mouth daily.   Yes Historical Provider, MD  promethazine (PHENERGAN) 25 MG tablet Take 25 mg by mouth every 6 (six) hours as needed for nausea or vomiting.   Yes Historical Provider, MD  trastuzumab (HERCEPTIN) 440 MG injection 440 mg by Intravenous (Continuous Infusion) route every 21 ( twenty-one) days. infusion every 3 weeks 03/19/09  Yes Historical Provider, MD     No Known Allergies  ROS:  Out of a complete 14 system review of symptoms, the patient complains only of the following symptoms, and all other reviewed systems are negative.  Fatigue Palpitations of the heart, swelling in the legs Itching Urinary incontinence Feeling cold Joint pain, joint swelling, aching muscles Numbness, weakness Depression, anxiety, decreased energy, disinterest in activities  Blood pressure (!) 152/68, pulse (!) 48, height 5' 1.5" (1.562 m), weight 146 lb 8 oz (66.5 kg).  Physical Exam  General: The patient is alert and cooperative at the time of the examination.  Eyes: Pupils are equal, round, and reactive to light. Discs are flat bilaterally. Good venous pulsations are seen bilaterally.  Neck: The neck is supple, no carotid bruits are noted.  Respiratory: The respiratory examination is clear.  Cardiovascular: The cardiovascular examination reveals a regular rate and rhythm, no obvious murmurs or rubs are noted.  Skin: Extremities are without significant edema.  Neurologic  Exam  Mental status: The patient is alert and oriented x 3 at the time of the examination. The patient has apparent normal recent and remote memory, with an apparently normal attention span and concentration ability.  Cranial nerves: Facial symmetry is present. There is good sensation of the face to pinprick and soft touch bilaterally. The strength of the facial muscles and the muscles to head turning and shoulder shrug are normal bilaterally. Speech is well enunciated, no aphasia or dysarthria is noted. Extraocular movements are full. Visual fields are full. The tongue is midline, and the patient has symmetric elevation of the soft palate. No obvious hearing deficits are noted.  Motor: The motor testing reveals 5 over 5 strength of all 4 extremities, with exception of minimal weakness with hip flexion bilaterally, left greater than right. Good symmetric motor tone is noted throughout.  Sensory: Sensory testing  is intact to pinprick, soft touch, vibration sensation, and position sense on all 4 extremities, with exception of some decrease in pinprick sensation just below the left knee laterally. No evidence of extinction is noted.  Coordination: Cerebellar testing reveals good finger-nose-finger and heel-to-shin bilaterally.  Gait and station: Gait is slightly wide-based, unsteady. Tandem gait was not attempted. Romberg is negative. No drift is seen.  Reflexes: Deep tendon reflexes are symmetric, but are depressed at the ankles bilaterally. Toes are downgoing bilaterally.   Assessment/Plan:  1. History of cervical and lumbar spine surgery  2. Chronic gait disorder  The patient has had some residual problems with walking since lumbar spine surgery in the past which left her with numbness in both legs. The patient believes that her walking has gradually changed over time. The patient will be set up for blood work today, she will be set up for physical therapy for gait training. The patient does  not wish to pursue EMG and nerve conduction study evaluation of the legs at this time. She will follow-up in about 4 months.  Jill Alexanders MD 07/05/2016 11:52 AM  [facility] [address] [place], [place] [address]  Phone [phone] Fax [phone]

## 2016-07-06 DIAGNOSIS — N6011 Diffuse cystic mastopathy of right breast: Secondary | ICD-10-CM | POA: Diagnosis not present

## 2016-07-06 DIAGNOSIS — C7800 Secondary malignant neoplasm of unspecified lung: Secondary | ICD-10-CM | POA: Diagnosis not present

## 2016-07-06 DIAGNOSIS — C787 Secondary malignant neoplasm of liver and intrahepatic bile duct: Secondary | ICD-10-CM | POA: Diagnosis not present

## 2016-07-06 DIAGNOSIS — C50919 Malignant neoplasm of unspecified site of unspecified female breast: Secondary | ICD-10-CM | POA: Diagnosis not present

## 2016-07-06 DIAGNOSIS — Z809 Family history of malignant neoplasm, unspecified: Secondary | ICD-10-CM | POA: Diagnosis not present

## 2016-07-06 DIAGNOSIS — M8589 Other specified disorders of bone density and structure, multiple sites: Secondary | ICD-10-CM | POA: Diagnosis not present

## 2016-07-06 DIAGNOSIS — N6311 Unspecified lump in the right breast, upper outer quadrant: Secondary | ICD-10-CM | POA: Diagnosis not present

## 2016-07-07 LAB — COPPER, SERUM: Copper: 117 ug/dL (ref 72–166)

## 2016-07-07 LAB — RPR: RPR Ser Ql: NONREACTIVE

## 2016-07-07 LAB — VITAMIN B12: Vitamin B-12: 331 pg/mL (ref 211–946)

## 2016-07-11 DIAGNOSIS — R269 Unspecified abnormalities of gait and mobility: Secondary | ICD-10-CM | POA: Diagnosis not present

## 2016-07-11 DIAGNOSIS — R531 Weakness: Secondary | ICD-10-CM | POA: Diagnosis not present

## 2016-07-11 DIAGNOSIS — R296 Repeated falls: Secondary | ICD-10-CM | POA: Diagnosis not present

## 2016-07-13 DIAGNOSIS — R928 Other abnormal and inconclusive findings on diagnostic imaging of breast: Secondary | ICD-10-CM | POA: Diagnosis not present

## 2016-07-13 DIAGNOSIS — N6311 Unspecified lump in the right breast, upper outer quadrant: Secondary | ICD-10-CM | POA: Diagnosis not present

## 2016-07-13 DIAGNOSIS — C50912 Malignant neoplasm of unspecified site of left female breast: Secondary | ICD-10-CM | POA: Diagnosis not present

## 2016-07-13 DIAGNOSIS — N6489 Other specified disorders of breast: Secondary | ICD-10-CM | POA: Diagnosis not present

## 2016-07-15 DIAGNOSIS — R296 Repeated falls: Secondary | ICD-10-CM | POA: Diagnosis not present

## 2016-07-15 DIAGNOSIS — R531 Weakness: Secondary | ICD-10-CM | POA: Diagnosis not present

## 2016-07-15 DIAGNOSIS — R269 Unspecified abnormalities of gait and mobility: Secondary | ICD-10-CM | POA: Diagnosis not present

## 2016-07-18 DIAGNOSIS — R269 Unspecified abnormalities of gait and mobility: Secondary | ICD-10-CM | POA: Diagnosis not present

## 2016-07-18 DIAGNOSIS — R296 Repeated falls: Secondary | ICD-10-CM | POA: Diagnosis not present

## 2016-07-18 DIAGNOSIS — R531 Weakness: Secondary | ICD-10-CM | POA: Diagnosis not present

## 2016-07-20 DIAGNOSIS — R296 Repeated falls: Secondary | ICD-10-CM | POA: Diagnosis not present

## 2016-07-20 DIAGNOSIS — R531 Weakness: Secondary | ICD-10-CM | POA: Diagnosis not present

## 2016-07-20 DIAGNOSIS — R269 Unspecified abnormalities of gait and mobility: Secondary | ICD-10-CM | POA: Diagnosis not present

## 2016-07-21 ENCOUNTER — Other Ambulatory Visit: Payer: Self-pay | Admitting: Oncology

## 2016-07-21 DIAGNOSIS — C7800 Secondary malignant neoplasm of unspecified lung: Secondary | ICD-10-CM | POA: Diagnosis not present

## 2016-07-21 DIAGNOSIS — C50912 Malignant neoplasm of unspecified site of left female breast: Secondary | ICD-10-CM | POA: Diagnosis not present

## 2016-07-21 DIAGNOSIS — C787 Secondary malignant neoplasm of liver and intrahepatic bile duct: Secondary | ICD-10-CM | POA: Diagnosis not present

## 2016-07-22 ENCOUNTER — Telehealth (INDEPENDENT_AMBULATORY_CARE_PROVIDER_SITE_OTHER): Payer: Self-pay | Admitting: Specialist

## 2016-07-22 NOTE — Telephone Encounter (Signed)
I called patient per your request and scheduled 10:30am appt on Jan 17th, 2018.  Patient is open to sooner appt if available.  She has chemo  Dec  22nd,2017 And Jan 12th 2018, so these are the only dates she would NOT be able to come.  She asks that we keep her in mind should we have any cancellations.

## 2016-07-22 NOTE — Telephone Encounter (Signed)
Patient would like an injection that she had declined in the past (for R hip) She states that it is Bursitis.  Will Nitka or Ernestina Patches do the injection?  Please advise

## 2016-07-22 NOTE — Telephone Encounter (Signed)
Noted! Thank you

## 2016-07-22 NOTE — Telephone Encounter (Signed)
It is a greater troch injection that is done with Dr. Louanne Skye. Will you call patient and get her scheduled for Dr. Arvil Persons first available.

## 2016-07-25 DIAGNOSIS — M5137 Other intervertebral disc degeneration, lumbosacral region: Secondary | ICD-10-CM | POA: Diagnosis not present

## 2016-07-25 DIAGNOSIS — I10 Essential (primary) hypertension: Secondary | ICD-10-CM | POA: Diagnosis not present

## 2016-07-25 DIAGNOSIS — E039 Hypothyroidism, unspecified: Secondary | ICD-10-CM | POA: Diagnosis not present

## 2016-07-27 DIAGNOSIS — R531 Weakness: Secondary | ICD-10-CM | POA: Diagnosis not present

## 2016-07-27 DIAGNOSIS — R262 Difficulty in walking, not elsewhere classified: Secondary | ICD-10-CM | POA: Diagnosis not present

## 2016-07-27 DIAGNOSIS — R296 Repeated falls: Secondary | ICD-10-CM | POA: Diagnosis not present

## 2016-07-27 DIAGNOSIS — R269 Unspecified abnormalities of gait and mobility: Secondary | ICD-10-CM | POA: Diagnosis not present

## 2016-08-01 DIAGNOSIS — R269 Unspecified abnormalities of gait and mobility: Secondary | ICD-10-CM | POA: Diagnosis not present

## 2016-08-01 DIAGNOSIS — R262 Difficulty in walking, not elsewhere classified: Secondary | ICD-10-CM | POA: Diagnosis not present

## 2016-08-01 DIAGNOSIS — R531 Weakness: Secondary | ICD-10-CM | POA: Diagnosis not present

## 2016-08-01 DIAGNOSIS — R296 Repeated falls: Secondary | ICD-10-CM | POA: Diagnosis not present

## 2016-08-04 ENCOUNTER — Ambulatory Visit: Payer: Medicare Other

## 2016-08-12 DIAGNOSIS — C50912 Malignant neoplasm of unspecified site of left female breast: Secondary | ICD-10-CM | POA: Diagnosis not present

## 2016-08-12 DIAGNOSIS — C787 Secondary malignant neoplasm of liver and intrahepatic bile duct: Secondary | ICD-10-CM | POA: Diagnosis not present

## 2016-08-12 DIAGNOSIS — C7800 Secondary malignant neoplasm of unspecified lung: Secondary | ICD-10-CM | POA: Diagnosis not present

## 2016-08-12 DIAGNOSIS — Z5111 Encounter for antineoplastic chemotherapy: Secondary | ICD-10-CM | POA: Diagnosis not present

## 2016-08-17 DIAGNOSIS — R262 Difficulty in walking, not elsewhere classified: Secondary | ICD-10-CM | POA: Diagnosis not present

## 2016-08-17 DIAGNOSIS — R531 Weakness: Secondary | ICD-10-CM | POA: Diagnosis not present

## 2016-08-17 DIAGNOSIS — R296 Repeated falls: Secondary | ICD-10-CM | POA: Diagnosis not present

## 2016-08-17 DIAGNOSIS — R269 Unspecified abnormalities of gait and mobility: Secondary | ICD-10-CM | POA: Diagnosis not present

## 2016-08-31 DIAGNOSIS — C7800 Secondary malignant neoplasm of unspecified lung: Secondary | ICD-10-CM | POA: Diagnosis not present

## 2016-08-31 DIAGNOSIS — C50912 Malignant neoplasm of unspecified site of left female breast: Secondary | ICD-10-CM | POA: Diagnosis not present

## 2016-08-31 DIAGNOSIS — C787 Secondary malignant neoplasm of liver and intrahepatic bile duct: Secondary | ICD-10-CM | POA: Diagnosis not present

## 2016-08-31 DIAGNOSIS — R938 Abnormal findings on diagnostic imaging of other specified body structures: Secondary | ICD-10-CM | POA: Diagnosis not present

## 2016-08-31 DIAGNOSIS — C50919 Malignant neoplasm of unspecified site of unspecified female breast: Secondary | ICD-10-CM | POA: Diagnosis not present

## 2016-09-02 DIAGNOSIS — C50919 Malignant neoplasm of unspecified site of unspecified female breast: Secondary | ICD-10-CM | POA: Diagnosis not present

## 2016-09-02 DIAGNOSIS — C7800 Secondary malignant neoplasm of unspecified lung: Secondary | ICD-10-CM | POA: Diagnosis not present

## 2016-09-02 DIAGNOSIS — M810 Age-related osteoporosis without current pathological fracture: Secondary | ICD-10-CM | POA: Diagnosis not present

## 2016-09-02 DIAGNOSIS — C50912 Malignant neoplasm of unspecified site of left female breast: Secondary | ICD-10-CM | POA: Diagnosis not present

## 2016-09-02 DIAGNOSIS — C787 Secondary malignant neoplasm of liver and intrahepatic bile duct: Secondary | ICD-10-CM | POA: Diagnosis not present

## 2016-09-07 ENCOUNTER — Ambulatory Visit (INDEPENDENT_AMBULATORY_CARE_PROVIDER_SITE_OTHER): Payer: Medicare Other | Admitting: Specialist

## 2016-09-12 ENCOUNTER — Ambulatory Visit (INDEPENDENT_AMBULATORY_CARE_PROVIDER_SITE_OTHER): Payer: Medicare Other | Admitting: Specialist

## 2016-09-23 DIAGNOSIS — C787 Secondary malignant neoplasm of liver and intrahepatic bile duct: Secondary | ICD-10-CM | POA: Diagnosis not present

## 2016-09-23 DIAGNOSIS — Z5111 Encounter for antineoplastic chemotherapy: Secondary | ICD-10-CM | POA: Diagnosis not present

## 2016-09-23 DIAGNOSIS — C7800 Secondary malignant neoplasm of unspecified lung: Secondary | ICD-10-CM | POA: Diagnosis not present

## 2016-09-23 DIAGNOSIS — C50912 Malignant neoplasm of unspecified site of left female breast: Secondary | ICD-10-CM | POA: Diagnosis not present

## 2016-10-14 DIAGNOSIS — Z5111 Encounter for antineoplastic chemotherapy: Secondary | ICD-10-CM | POA: Diagnosis not present

## 2016-10-14 DIAGNOSIS — C787 Secondary malignant neoplasm of liver and intrahepatic bile duct: Secondary | ICD-10-CM | POA: Diagnosis not present

## 2016-10-14 DIAGNOSIS — C7800 Secondary malignant neoplasm of unspecified lung: Secondary | ICD-10-CM | POA: Diagnosis not present

## 2016-10-14 DIAGNOSIS — C50912 Malignant neoplasm of unspecified site of left female breast: Secondary | ICD-10-CM | POA: Diagnosis not present

## 2016-10-17 DIAGNOSIS — L570 Actinic keratosis: Secondary | ICD-10-CM | POA: Diagnosis not present

## 2016-10-17 DIAGNOSIS — D1801 Hemangioma of skin and subcutaneous tissue: Secondary | ICD-10-CM | POA: Diagnosis not present

## 2016-10-17 DIAGNOSIS — B079 Viral wart, unspecified: Secondary | ICD-10-CM | POA: Diagnosis not present

## 2016-10-19 ENCOUNTER — Ambulatory Visit (INDEPENDENT_AMBULATORY_CARE_PROVIDER_SITE_OTHER): Payer: Medicare Other | Admitting: Specialist

## 2016-10-19 ENCOUNTER — Encounter (INDEPENDENT_AMBULATORY_CARE_PROVIDER_SITE_OTHER): Payer: Self-pay | Admitting: Specialist

## 2016-10-19 VITALS — BP 153/67 | HR 50 | Ht 61.0 in | Wt 142.0 lb

## 2016-10-19 DIAGNOSIS — G8929 Other chronic pain: Secondary | ICD-10-CM | POA: Diagnosis not present

## 2016-10-19 DIAGNOSIS — M7061 Trochanteric bursitis, right hip: Secondary | ICD-10-CM

## 2016-10-19 DIAGNOSIS — M545 Low back pain, unspecified: Secondary | ICD-10-CM

## 2016-10-19 DIAGNOSIS — M7062 Trochanteric bursitis, left hip: Secondary | ICD-10-CM

## 2016-10-19 DIAGNOSIS — M5440 Lumbago with sciatica, unspecified side: Secondary | ICD-10-CM

## 2016-10-19 NOTE — Patient Instructions (Signed)
The main ways of treat osteoarthritis, that are found to be success. Weight loss helps to decrease pain. Exercise is important to maintaining cartilage and thickness and strengthening. NSAIDs like tylenol  are meds decreasing the inflamation. Ice is okay  In afternoon and evening and hot shower in the am Please call a month ahead of follow up appt so we can order new Synvisc One material. We have [facility] a [facility] Ordered MRI with and without contrast of the lumbar spine.

## 2016-10-19 NOTE — Progress Notes (Addendum)
Office Visit Note   Patient: Karen Allison           Date of Birth: 10-10-1945           MRN: [id number] Visit Date: 10/19/2016              Requested by: [name], MD [address] [place], [place] [address] PCP: [name], MD   Assessment & Plan: Visit Diagnoses:  1. Chronic bilateral low back pain without sciatica   2. Acute bilateral low back pain with sciatica, sciatica laterality unspecified   3. Trochanteric bursitis, left hip   4. Trochanteric bursitis, right hip     Plan: The main ways of treat osteoarthritis, that are found to be success. Weight loss helps to decrease pain. Exercise is important to maintaining cartilage and thickness and strengthening. NSAIDs like tylenol  are meds decreasing the inflamation. Ice is okay  In afternoon and evening and hot shower in the am We have sent a [facility] to [facility] Ordered MRI with and without contrast of the lumbar spine.    Follow-Up Instructions: Return in about 3 weeks (around 11/09/2016).   Orders:  Orders Placed This Encounter  Procedures  . MR Lumbar Spine W Wo Contrast  . Ambulatory referral to Physical Therapy   No orders of the defined types were placed in this encounter.     Procedures: No procedures performed   Clinical Data: No additional findings.   Subjective: Chief Complaint  Patient presents with  . Lower Back - Follow-up    Karen Allison is here to follow up on her back.  She states that she is having numbness from mid-thigh down. She states that she is having pain with walking long distances.  She states that she has had a fall since she was here last.  She says that she has been taking a lot of pain medications. She has been doing physical therapy at [facility] for her back. Complains of pain both lateral hips laterally, had a fall prior to last visit, MRI without fracture, there is fluid collection in the left greater trochanteric bursa, inflammation both areas of  the adjacent ITB and gluteal muscles. Going through chemoRx  Every 3 weeks, had CT of the chest recently to assess the rib area right posteriorly that was of concern for a recurrent metastatic process.     Review of Systems  Constitutional: Negative.   HENT: Negative.   Eyes: Negative.   Respiratory: Negative.   Cardiovascular: Negative.   Gastrointestinal: Negative.   Endocrine: Negative.   Genitourinary: Negative.   Musculoskeletal: Negative.   Skin: Negative.   Allergic/Immunologic: Negative.   Neurological: Negative.   Hematological: Negative.   Psychiatric/Behavioral: Negative.      Objective: Vital Signs: BP (!) 153/67 (BP Location: Left Arm, Patient Position: Sitting)   Pulse (!) 50   Ht '5\' 1"'$  (1.549 m)   Wt 142 lb (64.4 kg)   BMI 26.83 kg/m   Physical Exam  Constitutional: She is oriented to person, place, and time. She appears well-developed and well-nourished.  HENT:  Head: Normocephalic and atraumatic.  Eyes: EOM are normal. Pupils are equal, round, and reactive to light.  Neck: Normal range of motion. Neck supple.  Pulmonary/Chest: Effort normal and breath sounds normal.  Abdominal: Soft. Bowel sounds are normal.  Neurological: She is alert and oriented to person, place, and time.  Skin: Skin is warm and dry.  Psychiatric: She has a normal mood  and affect. Her behavior is normal. Judgment and thought content normal.    Right Hip Exam   Tenderness  The patient is experiencing tenderness in the greater trochanter.  Range of Motion  Extension: normal  Flexion: normal  Internal Rotation: abnormal  External Rotation: normal  Abduction: normal  Adduction: normal   Muscle Strength  Abduction: 5/5  Adduction: 5/5  Flexion: 5/5   Tests  Ober: positive  Other  Erythema: absent Scars: present Sensation: decreased Pulse: present   Left Hip Exam   Tenderness  The patient is experiencing tenderness in the greater trochanter.  Range of Motion    Extension: normal  Flexion: normal  Internal Rotation: abnormal  External Rotation: normal  Abduction: normal  Adduction: normal   Muscle Strength  Abduction: 5/5  Adduction: 5/5  Flexion: 5/5   Tests  Ober: positive  Other  Erythema: absent Scars: present Sensation: decreased Pulse: present   Back Exam   Muscle Strength  Right Quadriceps:  5/5  Left Quadriceps:  5/5  Right Hamstrings:  5/5  Left Hamstrings:  5/5   Reflexes  Patellar: abnormal Achilles: abnormal      Specialty Comments:  No specialty comments available.  Imaging: No results found.   PMFS History: Patient Active Problem List   Diagnosis Date Noted  . Spinal stenosis in cervical region 05/06/2014    Priority: High    Class: Chronic  . Cervical spondylosis without myelopathy 05/06/2014    Priority: High  . Gait disorder 07/05/2016  . HCAP (healthcare-associated pneumonia) 05/09/2014  . Acute respiratory failure with hypoxia ([place]) 05/09/2014  . Pneumonia 05/09/2014  . Hypertension   . Hypothyroidism   . Breast cancer ([place])   . Cervical stenosis (uterine cervix) 05/07/2014  . HNP (herniated nucleus pulposus), cervical 05/06/2014  . Adult hypothyroidism 05/03/2013  . 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 Primary malignant neoplasm of breast ([place]) 10/24/2002   Past Medical History:  Diagnosis Date  . Arthritis   . Blood dyscrasia    bleeds and bruises easily  . Breast cancer ([place])   . Gait disorder 07/05/2016  . Hepatitis     Hep A years ago  . Hypertension   . Hypothyroidism    Graves disease, age 56 yo  . Nose trouble    right nostril will hemorrhage at times  . Pneumonia     Family History  Problem Relation Age of Onset  . Congestive Heart Failure Mother   . Heart disease Father   . Tongue cancer Grandchild     Past Surgical History:  Procedure Laterality Date  . ABDOMINAL HYSTERECTOMY     Age 24  . ANTERIOR CERVICAL DECOMP/DISCECTOMY FUSION N/A  05/06/2014   Procedure: ANTERIOR CERVICAL DISCECTOMY FUSION C3-4 with transgraft, local bone graft, plate and screws;  Surgeon: Jessy Oto, MD;  Location: [facility];  Service: Orthopedics;  Laterality: N/A;  . APPENDECTOMY    . BREAST LUMPECTOMY Left   . COLONOSCOPY    . EYE SURGERY Bilateral    cataracts  . LUMBAR SPINE SURGERY  2011   Removal of hematoma  . ORIF HUMERUS FRACTURE Left 2013  . THORACIC SPINE SURGERY  2011   Bone cages  . THYROIDECTOMY    . TONSILLECTOMY     Social History   Occupational History  . Retired    Social History Main Topics  . Smoking status: Former Smoker    Types: Cigarettes    Quit date: 04/23/1962  . Smokeless tobacco: Never  Used     Comment: as teenager  . Alcohol use Yes     Comment: Social  . Drug use: No  . Sexual activity: Not on file

## 2016-10-21 ENCOUNTER — Other Ambulatory Visit (INDEPENDENT_AMBULATORY_CARE_PROVIDER_SITE_OTHER): Payer: Self-pay | Admitting: *Deleted

## 2016-10-21 DIAGNOSIS — G8929 Other chronic pain: Secondary | ICD-10-CM

## 2016-10-21 DIAGNOSIS — M5440 Lumbago with sciatica, unspecified side: Principal | ICD-10-CM

## 2016-10-24 DIAGNOSIS — M545 Low back pain: Secondary | ICD-10-CM | POA: Diagnosis not present

## 2016-10-24 DIAGNOSIS — M25552 Pain in left hip: Secondary | ICD-10-CM | POA: Diagnosis not present

## 2016-10-24 DIAGNOSIS — M25652 Stiffness of left hip, not elsewhere classified: Secondary | ICD-10-CM | POA: Diagnosis not present

## 2016-10-24 DIAGNOSIS — M25551 Pain in right hip: Secondary | ICD-10-CM | POA: Diagnosis not present

## 2016-10-24 DIAGNOSIS — M25651 Stiffness of right hip, not elsewhere classified: Secondary | ICD-10-CM | POA: Diagnosis not present

## 2016-10-24 DIAGNOSIS — M6281 Muscle weakness (generalized): Secondary | ICD-10-CM | POA: Diagnosis not present

## 2016-10-25 DIAGNOSIS — M5125 Other intervertebral disc displacement, thoracolumbar region: Secondary | ICD-10-CM | POA: Diagnosis not present

## 2016-10-25 DIAGNOSIS — M5440 Lumbago with sciatica, unspecified side: Secondary | ICD-10-CM | POA: Diagnosis not present

## 2016-10-25 DIAGNOSIS — Z981 Arthrodesis status: Secondary | ICD-10-CM | POA: Diagnosis not present

## 2016-10-27 ENCOUNTER — Encounter (INDEPENDENT_AMBULATORY_CARE_PROVIDER_SITE_OTHER): Payer: Self-pay | Admitting: Specialist

## 2016-10-27 DIAGNOSIS — M6281 Muscle weakness (generalized): Secondary | ICD-10-CM | POA: Diagnosis not present

## 2016-10-27 DIAGNOSIS — M25651 Stiffness of right hip, not elsewhere classified: Secondary | ICD-10-CM | POA: Diagnosis not present

## 2016-10-27 DIAGNOSIS — M25552 Pain in left hip: Secondary | ICD-10-CM | POA: Diagnosis not present

## 2016-10-27 DIAGNOSIS — M545 Low back pain: Secondary | ICD-10-CM | POA: Diagnosis not present

## 2016-10-27 DIAGNOSIS — M25652 Stiffness of left hip, not elsewhere classified: Secondary | ICD-10-CM | POA: Diagnosis not present

## 2016-10-27 DIAGNOSIS — M25551 Pain in right hip: Secondary | ICD-10-CM | POA: Diagnosis not present

## 2016-11-01 ENCOUNTER — Ambulatory Visit: Payer: Medicare Other | Admitting: Neurology

## 2016-11-02 DIAGNOSIS — M545 Low back pain: Secondary | ICD-10-CM | POA: Diagnosis not present

## 2016-11-02 DIAGNOSIS — M25552 Pain in left hip: Secondary | ICD-10-CM | POA: Diagnosis not present

## 2016-11-02 DIAGNOSIS — M6281 Muscle weakness (generalized): Secondary | ICD-10-CM | POA: Diagnosis not present

## 2016-11-02 DIAGNOSIS — M25651 Stiffness of right hip, not elsewhere classified: Secondary | ICD-10-CM | POA: Diagnosis not present

## 2016-11-02 DIAGNOSIS — M25652 Stiffness of left hip, not elsewhere classified: Secondary | ICD-10-CM | POA: Diagnosis not present

## 2016-11-02 DIAGNOSIS — M25551 Pain in right hip: Secondary | ICD-10-CM | POA: Diagnosis not present

## 2016-11-03 ENCOUNTER — Other Ambulatory Visit (INDEPENDENT_AMBULATORY_CARE_PROVIDER_SITE_OTHER): Payer: Self-pay | Admitting: Specialist

## 2016-11-03 ENCOUNTER — Telehealth (INDEPENDENT_AMBULATORY_CARE_PROVIDER_SITE_OTHER): Payer: Self-pay | Admitting: Specialist

## 2016-11-03 DIAGNOSIS — M4185 Other forms of scoliosis, thoracolumbar region: Secondary | ICD-10-CM | POA: Insufficient documentation

## 2016-11-03 HISTORY — DX: Other forms of scoliosis, thoracolumbar region: M41.85

## 2016-11-03 NOTE — Telephone Encounter (Signed)
Rx for DME for Thoracolumbar corsett in chart please fax to [employer].

## 2016-11-03 NOTE — Telephone Encounter (Signed)
Patient called asked if she can get a brace for her back. Patient asked if the Rx can be faxed to [place]. Attn: Mortimer Fries

## 2016-11-03 NOTE — Telephone Encounter (Signed)
Patient called asked if she can get a brace for her back. Patient asked if the Rx can be faxed to [place]. Attn: Bobby. The number to contact patient is [phone]

## 2016-11-04 DIAGNOSIS — Z5111 Encounter for antineoplastic chemotherapy: Secondary | ICD-10-CM | POA: Diagnosis not present

## 2016-11-04 DIAGNOSIS — C787 Secondary malignant neoplasm of liver and intrahepatic bile duct: Secondary | ICD-10-CM | POA: Diagnosis not present

## 2016-11-04 DIAGNOSIS — C7800 Secondary malignant neoplasm of unspecified lung: Secondary | ICD-10-CM | POA: Diagnosis not present

## 2016-11-04 DIAGNOSIS — C50919 Malignant neoplasm of unspecified site of unspecified female breast: Secondary | ICD-10-CM | POA: Diagnosis not present

## 2016-11-04 NOTE — Telephone Encounter (Signed)
Order faxed to [facility], I called and advised patient that this was done

## 2016-11-07 DIAGNOSIS — M25551 Pain in right hip: Secondary | ICD-10-CM | POA: Diagnosis not present

## 2016-11-07 DIAGNOSIS — M25652 Stiffness of left hip, not elsewhere classified: Secondary | ICD-10-CM | POA: Diagnosis not present

## 2016-11-07 DIAGNOSIS — M6281 Muscle weakness (generalized): Secondary | ICD-10-CM | POA: Diagnosis not present

## 2016-11-07 DIAGNOSIS — M545 Low back pain: Secondary | ICD-10-CM | POA: Diagnosis not present

## 2016-11-07 DIAGNOSIS — M25552 Pain in left hip: Secondary | ICD-10-CM | POA: Diagnosis not present

## 2016-11-07 DIAGNOSIS — M25651 Stiffness of right hip, not elsewhere classified: Secondary | ICD-10-CM | POA: Diagnosis not present

## 2016-11-09 DIAGNOSIS — M25551 Pain in right hip: Secondary | ICD-10-CM | POA: Diagnosis not present

## 2016-11-09 DIAGNOSIS — M545 Low back pain: Secondary | ICD-10-CM | POA: Diagnosis not present

## 2016-11-09 DIAGNOSIS — M25552 Pain in left hip: Secondary | ICD-10-CM | POA: Diagnosis not present

## 2016-11-09 DIAGNOSIS — M25651 Stiffness of right hip, not elsewhere classified: Secondary | ICD-10-CM | POA: Diagnosis not present

## 2016-11-09 DIAGNOSIS — M25652 Stiffness of left hip, not elsewhere classified: Secondary | ICD-10-CM | POA: Diagnosis not present

## 2016-11-09 DIAGNOSIS — M6281 Muscle weakness (generalized): Secondary | ICD-10-CM | POA: Diagnosis not present

## 2016-11-14 DIAGNOSIS — M25551 Pain in right hip: Secondary | ICD-10-CM | POA: Diagnosis not present

## 2016-11-14 DIAGNOSIS — M25651 Stiffness of right hip, not elsewhere classified: Secondary | ICD-10-CM | POA: Diagnosis not present

## 2016-11-14 DIAGNOSIS — M545 Low back pain: Secondary | ICD-10-CM | POA: Diagnosis not present

## 2016-11-14 DIAGNOSIS — M25552 Pain in left hip: Secondary | ICD-10-CM | POA: Diagnosis not present

## 2016-11-14 DIAGNOSIS — M25652 Stiffness of left hip, not elsewhere classified: Secondary | ICD-10-CM | POA: Diagnosis not present

## 2016-11-14 DIAGNOSIS — M6281 Muscle weakness (generalized): Secondary | ICD-10-CM | POA: Diagnosis not present

## 2016-11-17 ENCOUNTER — Ambulatory Visit (INDEPENDENT_AMBULATORY_CARE_PROVIDER_SITE_OTHER): Payer: Medicare Other | Admitting: Specialist

## 2016-11-17 ENCOUNTER — Encounter (INDEPENDENT_AMBULATORY_CARE_PROVIDER_SITE_OTHER): Payer: Self-pay | Admitting: Specialist

## 2016-11-17 VITALS — BP 137/59 | HR 53 | Ht 61.0 in | Wt 142.0 lb

## 2016-11-17 DIAGNOSIS — M5134 Other intervertebral disc degeneration, thoracic region: Secondary | ICD-10-CM

## 2016-11-17 DIAGNOSIS — M7062 Trochanteric bursitis, left hip: Secondary | ICD-10-CM

## 2016-11-17 MED ORDER — ESOMEPRAZOLE MAGNESIUM 20 MG PO PACK: 20.0000 mg | PACK | Freq: Every day | ORAL | 12 refills | Status: DC

## 2016-11-17 MED ORDER — METHYLPREDNISOLONE ACETATE 40 MG/ML IJ SUSP
40.0000 mg | INTRAMUSCULAR | Status: AC | PRN
  Administered 2016-11-17: 40 mg via INTRA_ARTICULAR

## 2016-11-17 MED ORDER — BUPIVACAINE HCL 0.25 % IJ SOLN
4.0000 mL | INTRAMUSCULAR | Status: AC | PRN
  Administered 2016-11-17: 4 mL via INTRA_ARTICULAR

## 2016-11-17 NOTE — Addendum Note (Signed)
Addended by: Basil Dess on: 11/17/2016 04:01 PM   Modules accepted: Orders

## 2016-11-17 NOTE — Patient Instructions (Addendum)
Avoid frequent bending and stooping  No lifting greater than 10 lbs. May use ice or moist heat for pain. Weight loss is of benefit. Handicap license is approved.  Nexium for decreasing risks of ulcer disease or GERD.

## 2016-11-17 NOTE — Progress Notes (Signed)
Office Visit Note   Patient: Karen Allison           Date of Birth: 1946/03/13           MRN: [id number] Visit Date: 11/17/2016              Requested by: [name], MD [address] [place], [place] [address] PCP: [name], MD   Assessment & Plan: Visit Diagnoses:  1. Trochanteric bursitis, left hip   2. Thoracic degenerative disc disease     Plan: Avoid frequent bending and stooping  No lifting greater than 10 lbs. May use ice or moist heat for pain. Weight loss is of benefit. Handicap license is approved.  Work on stretching exercises on the left iliotibial  Band.   Follow-Up Instructions: No Follow-up on file.   Orders:  No orders of the defined types were placed in this encounter.  No orders of the defined types were placed in this encounter.     Procedures: Large Joint Inj Date/Time: 11/17/2016 3:53 PM Performed by: Jessy Oto Authorized by: Jessy Oto   Consent Given by:  Patient Site marked: the procedure site was marked   Indications:  Pain Location:  Hip Site:  L greater trochanter Prep: patient was prepped and draped in usual sterile fashion   Needle Size:  25 G Needle Length:  3.5 inches and 1.5 inches Approach:  Lateral Ultrasound Guidance: No   Fluoroscopic Guidance: No   Arthrogram: No   Medications:  40 mg methylPREDNISolone acetate 40 MG/ML; 4 mL bupivacaine 0.25 % Aspiration Attempted: No   Patient tolerance:  Patient tolerated the procedure well with no immediate complications  bandaid applied     Clinical Data: No additional findings.   Subjective: Chief Complaint  Patient presents with  . Lower Back - Pain    Karen Allison is here to review MRI of her Lumbar with and without contrast..  She states that she has not had any changes in her symptoms.  Having some abdomenal pain and epigastric pain, like stomach upset goes all the way through to the back.     Review of Systems  Constitutional: Negative.     HENT: Negative.   Eyes: Negative.   Respiratory: Negative.   Cardiovascular: Negative.   Gastrointestinal: Negative.   Endocrine: Negative.   Genitourinary: Negative.   Musculoskeletal: Negative.   Skin: Negative.   Allergic/Immunologic: Negative.   Neurological: Negative.   Hematological: Negative.   Psychiatric/Behavioral: Negative.      Objective: Vital Signs: BP (!) 137/59 (BP Location: Left Arm, Patient Position: Sitting)   Pulse (!) 53   Ht '5\' 1"'$  (1.549 m)   Wt 142 lb (64.4 kg)   BMI 26.83 kg/m   Physical Exam  Constitutional: She is oriented to person, place, and time. She appears well-developed and well-nourished.  HENT:  Head: Normocephalic and atraumatic.  Eyes: EOM are normal. Pupils are equal, round, and reactive to light.  Neck: Normal range of motion. Neck supple.  Pulmonary/Chest: Effort normal and breath sounds normal.  Abdominal: Soft. Bowel sounds are normal.  Neurological: She is alert and oriented to person, place, and time.  Skin: Skin is warm and dry.  Psychiatric: She has a normal mood and affect. Her behavior is normal. Judgment and thought content normal.    Back Exam   Tenderness  The patient is experiencing tenderness in the lumbar.  Range of Motion  Extension: abnormal  Flexion: abnormal  Lateral Bend Right:  abnormal  Lateral Bend Left: abnormal  Rotation Right: abnormal  Rotation Left: abnormal   Muscle Strength  Right Quadriceps:  5/5  Left Quadriceps:  5/5  Right Hamstrings:  5/5  Left Hamstrings:  5/5   Tests  Straight leg raise right: negative Straight leg raise left: negative  Reflexes  Patellar: 0/4 Achilles: 0/4 Babinski's sign: normal   Other  Toe Walk: abnormal Heel Walk: abnormal Sensation: decreased Gait: normal       Specialty Comments:  No specialty comments available.  Imaging: No results found.   PMFS History: Patient Active Problem List   Diagnosis Date Noted  . Other forms of  scoliosis, thoracolumbar region 11/03/2016    Priority: High    Class: Chronic  . Spinal stenosis in cervical region 05/06/2014    Priority: High    Class: Chronic  . Cervical spondylosis without myelopathy 05/06/2014    Priority: High  . Gait disorder 07/05/2016  . HCAP (healthcare-associated pneumonia) 05/09/2014  . Acute respiratory failure with hypoxia ([facility]) 05/09/2014  . Pneumonia 05/09/2014  . Hypertension   . Hypothyroidism   . Breast cancer ([facility])   . Cervical stenosis (uterine cervix) 05/07/2014  . HNP (herniated nucleus pulposus), cervical 05/06/2014  . Adult hypothyroidism 05/03/2013  . 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 Primary malignant neoplasm of breast ([facility]) 10/24/2002   Past Medical History:  Diagnosis Date  . Arthritis   . Blood dyscrasia    bleeds and bruises easily  . Breast cancer ([facility])   . Gait disorder 07/05/2016  . Hepatitis     Hep A years ago  . Hypertension   . Hypothyroidism    Graves disease, age 67 yo  . Nose trouble    right nostril will hemorrhage at times  . Pneumonia     Family History  Problem Relation Age of Onset  . Congestive Heart Failure Mother   . Heart disease Father   . Tongue cancer Grandchild     Past Surgical History:  Procedure Laterality Date  . ABDOMINAL HYSTERECTOMY     Age 60  . ANTERIOR CERVICAL DECOMP/DISCECTOMY FUSION N/A 05/06/2014   Procedure: ANTERIOR CERVICAL DISCECTOMY FUSION C3-4 with transgraft, local bone graft, plate and screws;  Surgeon: Jessy Oto, MD;  Location: [facility];  Service: Orthopedics;  Laterality: N/A;  . APPENDECTOMY    . BREAST LUMPECTOMY Left   . COLONOSCOPY    . EYE SURGERY Bilateral    cataracts  . LUMBAR SPINE SURGERY  2011   Removal of hematoma  . ORIF HUMERUS FRACTURE Left 2013  . THORACIC SPINE SURGERY  2011   Bone cages  . THYROIDECTOMY    . TONSILLECTOMY     Social History   Occupational History  . Retired    Social History Main Topics  . Smoking status:  Former Smoker    Types: Cigarettes    Quit date: 04/23/1962  . Smokeless tobacco: Never Used     Comment: as teenager  . Alcohol use Yes     Comment: Social  . Drug use: No  . Sexual activity: Not on file

## 2016-11-23 MED ORDER — ESOMEPRAZOLE MAGNESIUM 20 MG PO CPDR: 20.0000 mg | DELAYED_RELEASE_CAPSULE | Freq: Every day | ORAL | 3 refills | Status: DC

## 2016-11-23 NOTE — Addendum Note (Signed)
Addended by: Basil Dess on: 11/23/2016 06:12 PM   Modules accepted: Orders

## 2016-11-25 DIAGNOSIS — C787 Secondary malignant neoplasm of liver and intrahepatic bile duct: Secondary | ICD-10-CM | POA: Diagnosis not present

## 2016-11-25 DIAGNOSIS — C7800 Secondary malignant neoplasm of unspecified lung: Secondary | ICD-10-CM | POA: Diagnosis not present

## 2016-11-25 DIAGNOSIS — Z5111 Encounter for antineoplastic chemotherapy: Secondary | ICD-10-CM | POA: Diagnosis not present

## 2016-11-25 DIAGNOSIS — C50912 Malignant neoplasm of unspecified site of left female breast: Secondary | ICD-10-CM | POA: Diagnosis not present

## 2016-12-01 ENCOUNTER — Ambulatory Visit (INDEPENDENT_AMBULATORY_CARE_PROVIDER_SITE_OTHER): Payer: Medicare Other | Admitting: Specialist

## 2016-12-06 DIAGNOSIS — Z1389 Encounter for screening for other disorder: Secondary | ICD-10-CM | POA: Diagnosis not present

## 2016-12-06 DIAGNOSIS — Z0001 Encounter for general adult medical examination with abnormal findings: Secondary | ICD-10-CM | POA: Diagnosis not present

## 2016-12-06 DIAGNOSIS — E039 Hypothyroidism, unspecified: Secondary | ICD-10-CM | POA: Diagnosis not present

## 2016-12-06 DIAGNOSIS — E785 Hyperlipidemia, unspecified: Secondary | ICD-10-CM | POA: Diagnosis not present

## 2016-12-06 DIAGNOSIS — R040 Epistaxis: Secondary | ICD-10-CM | POA: Diagnosis not present

## 2016-12-06 DIAGNOSIS — M810 Age-related osteoporosis without current pathological fracture: Secondary | ICD-10-CM | POA: Diagnosis not present

## 2016-12-06 DIAGNOSIS — Z23 Encounter for immunization: Secondary | ICD-10-CM | POA: Diagnosis not present

## 2016-12-06 DIAGNOSIS — Z79899 Other long term (current) drug therapy: Secondary | ICD-10-CM | POA: Diagnosis not present

## 2016-12-06 DIAGNOSIS — I10 Essential (primary) hypertension: Secondary | ICD-10-CM | POA: Diagnosis not present

## 2016-12-15 DIAGNOSIS — C7800 Secondary malignant neoplasm of unspecified lung: Secondary | ICD-10-CM | POA: Diagnosis not present

## 2016-12-15 DIAGNOSIS — Z5111 Encounter for antineoplastic chemotherapy: Secondary | ICD-10-CM | POA: Diagnosis not present

## 2016-12-15 DIAGNOSIS — C787 Secondary malignant neoplasm of liver and intrahepatic bile duct: Secondary | ICD-10-CM | POA: Diagnosis not present

## 2016-12-15 DIAGNOSIS — C50911 Malignant neoplasm of unspecified site of right female breast: Secondary | ICD-10-CM | POA: Diagnosis not present

## 2017-01-06 DIAGNOSIS — C50912 Malignant neoplasm of unspecified site of left female breast: Secondary | ICD-10-CM | POA: Diagnosis not present

## 2017-01-06 DIAGNOSIS — Z5111 Encounter for antineoplastic chemotherapy: Secondary | ICD-10-CM | POA: Diagnosis not present

## 2017-01-06 DIAGNOSIS — C787 Secondary malignant neoplasm of liver and intrahepatic bile duct: Secondary | ICD-10-CM | POA: Diagnosis not present

## 2017-01-18 ENCOUNTER — Ambulatory Visit (INDEPENDENT_AMBULATORY_CARE_PROVIDER_SITE_OTHER): Payer: Medicare Other | Admitting: Specialist

## 2017-01-19 ENCOUNTER — Ambulatory Visit (INDEPENDENT_AMBULATORY_CARE_PROVIDER_SITE_OTHER): Payer: Medicare Other | Admitting: Specialist

## 2017-01-27 DIAGNOSIS — C50912 Malignant neoplasm of unspecified site of left female breast: Secondary | ICD-10-CM | POA: Diagnosis not present

## 2017-01-27 DIAGNOSIS — C787 Secondary malignant neoplasm of liver and intrahepatic bile duct: Secondary | ICD-10-CM | POA: Diagnosis not present

## 2017-01-27 DIAGNOSIS — Z5111 Encounter for antineoplastic chemotherapy: Secondary | ICD-10-CM | POA: Diagnosis not present

## 2017-01-27 DIAGNOSIS — C7800 Secondary malignant neoplasm of unspecified lung: Secondary | ICD-10-CM | POA: Diagnosis not present

## 2017-02-17 DIAGNOSIS — I340 Nonrheumatic mitral (valve) insufficiency: Secondary | ICD-10-CM | POA: Diagnosis not present

## 2017-02-17 DIAGNOSIS — T451X1D Poisoning by antineoplastic and immunosuppressive drugs, accidental (unintentional), subsequent encounter: Secondary | ICD-10-CM | POA: Diagnosis not present

## 2017-02-17 DIAGNOSIS — C50912 Malignant neoplasm of unspecified site of left female breast: Secondary | ICD-10-CM | POA: Diagnosis not present

## 2017-02-17 DIAGNOSIS — Z5111 Encounter for antineoplastic chemotherapy: Secondary | ICD-10-CM | POA: Diagnosis not present

## 2017-02-17 DIAGNOSIS — C7800 Secondary malignant neoplasm of unspecified lung: Secondary | ICD-10-CM | POA: Diagnosis not present

## 2017-02-17 DIAGNOSIS — C787 Secondary malignant neoplasm of liver and intrahepatic bile duct: Secondary | ICD-10-CM | POA: Diagnosis not present

## 2017-02-28 DIAGNOSIS — J984 Other disorders of lung: Secondary | ICD-10-CM | POA: Diagnosis not present

## 2017-02-28 DIAGNOSIS — I700 Atherosclerosis of aorta: Secondary | ICD-10-CM | POA: Diagnosis not present

## 2017-02-28 DIAGNOSIS — Z9889 Other specified postprocedural states: Secondary | ICD-10-CM | POA: Diagnosis not present

## 2017-02-28 DIAGNOSIS — C50919 Malignant neoplasm of unspecified site of unspecified female breast: Secondary | ICD-10-CM | POA: Diagnosis not present

## 2017-02-28 DIAGNOSIS — Z170 Estrogen receptor positive status [ER+]: Secondary | ICD-10-CM | POA: Diagnosis not present

## 2017-02-28 DIAGNOSIS — C7801 Secondary malignant neoplasm of right lung: Secondary | ICD-10-CM | POA: Diagnosis not present

## 2017-02-28 DIAGNOSIS — Z5181 Encounter for therapeutic drug level monitoring: Secondary | ICD-10-CM | POA: Diagnosis not present

## 2017-02-28 DIAGNOSIS — Z79899 Other long term (current) drug therapy: Secondary | ICD-10-CM | POA: Diagnosis not present

## 2017-02-28 DIAGNOSIS — C50912 Malignant neoplasm of unspecified site of left female breast: Secondary | ICD-10-CM | POA: Diagnosis not present

## 2017-02-28 DIAGNOSIS — I2510 Atherosclerotic heart disease of native coronary artery without angina pectoris: Secondary | ICD-10-CM | POA: Diagnosis not present

## 2017-02-28 DIAGNOSIS — K862 Cyst of pancreas: Secondary | ICD-10-CM | POA: Diagnosis not present

## 2017-02-28 DIAGNOSIS — Z90710 Acquired absence of both cervix and uterus: Secondary | ICD-10-CM | POA: Diagnosis not present

## 2017-02-28 DIAGNOSIS — C787 Secondary malignant neoplasm of liver and intrahepatic bile duct: Secondary | ICD-10-CM | POA: Diagnosis not present

## 2017-03-09 ENCOUNTER — Ambulatory Visit (INDEPENDENT_AMBULATORY_CARE_PROVIDER_SITE_OTHER): Payer: Medicare Other | Admitting: Specialist

## 2017-03-09 ENCOUNTER — Encounter (INDEPENDENT_AMBULATORY_CARE_PROVIDER_SITE_OTHER): Payer: Self-pay | Admitting: Specialist

## 2017-03-09 ENCOUNTER — Ambulatory Visit (INDEPENDENT_AMBULATORY_CARE_PROVIDER_SITE_OTHER): Payer: Medicare Other

## 2017-03-09 VITALS — BP 150/73 | HR 66 | Ht 61.0 in | Wt 142.0 lb

## 2017-03-09 DIAGNOSIS — M25562 Pain in left knee: Secondary | ICD-10-CM

## 2017-03-09 DIAGNOSIS — M659 Synovitis and tenosynovitis, unspecified: Secondary | ICD-10-CM

## 2017-03-09 DIAGNOSIS — M7062 Trochanteric bursitis, left hip: Secondary | ICD-10-CM | POA: Diagnosis not present

## 2017-03-09 DIAGNOSIS — M94262 Chondromalacia, left knee: Secondary | ICD-10-CM

## 2017-03-09 MED ORDER — BUPIVACAINE HCL 0.25 % IJ SOLN
6.0000 mL | INTRAMUSCULAR | Status: AC | PRN
  Administered 2017-03-09: 6 mL via INTRA_ARTICULAR

## 2017-03-09 MED ORDER — LIDOCAINE HCL 1 % IJ SOLN
1.0000 mL | INTRAMUSCULAR | Status: AC | PRN
  Administered 2017-03-09: 1 mL

## 2017-03-09 MED ORDER — METHYLPREDNISOLONE ACETATE 40 MG/ML IJ SUSP
40.0000 mg | INTRAMUSCULAR | Status: AC | PRN
  Administered 2017-03-09: 40 mg via INTRA_ARTICULAR

## 2017-03-09 NOTE — Progress Notes (Signed)
Office Visit Note   Patient: Karen Allison           Date of Birth: Sep 13, 1945           MRN: [id number] Visit Date: 03/09/2017              Requested by: [name], MD [address] [place], [place] [address] PCP: [name], MD   Assessment & Plan: Visit Diagnoses:  1. Acute pain of left knee   2. Synovitis of left knee   3. Chondromalacia of left knee     Plan: The try to give patient some relief offered injection. After patient consent left knee was prepped with Betadine and intra-articular Marcaine/Depo-Medrol injection was performed. Tolerated well without any issues. We'll follow-up in 4 weeks for recheck to see her knee is doing. If knee is better but continues to have lateral hip pain we will repeat left hip greater trochanter bursa Marcaine/Depo-Medrol injection that was done March 2018.  Follow-Up Instructions: Return in about 4 weeks (around 04/06/2017).   Orders:  Orders Placed This Encounter  Procedures  . XR Knee 1-2 Views Left   No orders of the defined types were placed in this encounter.     Procedures: Large Joint Inj Date/Time: 03/09/2017 2:28 PM Performed by: Lanae Crumbly Authorized by: Lanae Crumbly   Consent Given by:  Patient Timeout: prior to procedure the correct patient, procedure, and site was verified   Indications:  Pain and joint swelling Location:  Knee Site:  L knee Needle Size:  25 G Needle Length:  1.5 inches Approach:  Anterolateral Ultrasound Guidance: No   Fluoroscopic Guidance: No   Medications:  40 mg methylPREDNISolone acetate 40 MG/ML; 1 mL lidocaine 1 %; 6 mL bupivacaine 0.25 % Aspiration Attempted: No       Clinical Data: No additional findings.   Subjective: Chief Complaint  Patient presents with  . Lower Back - Follow-up  . Left Knee - Injury    Hit knee hard    HPI Patient comes in today with complaints of left knee and left lateral hip pain. States that about 2 months ago she hit her  knee on something but cannot remember exactly what it was. Has been having pain since. Describes having pain at the joint line with some feeling of catching posterolateral corner. No true locking or feelings of instability. Also continues to have pain in the left lateral hip where she has known trochanteric bursitis. This has been seen on previous MRI.  Patient also had CT abdomen and pelvis 02/28/2017 and report read unchanged sclerotic right sixth rib lesion. No new evidence of metastatic disease within the chest, abdomen or pelvis. Incompletely imaged intramuscular rim-enhancing fluid collection adjacent to the left hip redemonstrated. Review of Systems No complaints of cardiac pulmonary gi gu issues.  Objective: Vital Signs: BP (!) 150/73 (BP Location: Left Arm, Patient Position: Sitting)   Pulse 66   Ht 5\' 1"  (1.549 m)   Wt 142 lb (64.4 kg)   BMI 26.83 kg/m   Physical Exam  Constitutional: She is oriented to person, place, and time. No distress.  HENT:  Head: Normocephalic.  Eyes: Pupils are equal, round, and reactive to light. EOM are normal.  Pulmonary/Chest: No respiratory distress.  Musculoskeletal:  Gait is somewhat antalgic. Negative logroll bilateral hips. Negative straight leg raise. She is moderate to markedly tender over the left hip greater trochanter bursa. Left knee good range motion. Positive crepitus. Joint line  tender. She does have some swelling without large effusion. Discomfort with McMurray's testing. Ligaments are stable.  Neurological: She is alert and oriented to person, place, and time.  Skin: Skin is dry.  Psychiatric: She has a normal mood and affect.    Ortho Exam  Specialty Comments:  No specialty comments available.  Imaging: No results found.   PMFS History: Patient Active Problem List   Diagnosis Date Noted  . Other forms of scoliosis, thoracolumbar region 11/03/2016    Class: Chronic  . Gait disorder 07/05/2016  . HCAP  (healthcare-associated pneumonia) 05/09/2014  . Acute respiratory failure with hypoxia ([facility]) 05/09/2014  . Pneumonia 05/09/2014  . Hypertension   . Hypothyroidism   . Breast cancer ([facility])   . Cervical stenosis (uterine cervix) 05/07/2014  . Spinal stenosis in cervical region 05/06/2014    Class: Chronic  . Cervical spondylosis without myelopathy 05/06/2014  . HNP (herniated nucleus pulposus), cervical 05/06/2014  . Adult hypothyroidism 05/03/2013  . 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 Primary malignant neoplasm of breast ([facility]) 10/24/2002   Past Medical History:  Diagnosis Date  . Arthritis   . Blood dyscrasia    bleeds and bruises easily  . Breast cancer ([facility])   . Gait disorder 07/05/2016  . Hepatitis     Hep A years ago  . Hypertension   . Hypothyroidism    Graves disease, age 23 yo  . Nose trouble    right nostril will hemorrhage at times  . Pneumonia     Family History  Problem Relation Age of Onset  . Congestive Heart Failure Mother   . Heart disease Father   . Tongue cancer Grandchild     Past Surgical History:  Procedure Laterality Date  . ABDOMINAL HYSTERECTOMY     Age 59  . ANTERIOR CERVICAL DECOMP/DISCECTOMY FUSION N/A 05/06/2014   Procedure: ANTERIOR CERVICAL DISCECTOMY FUSION C3-4 with transgraft, local bone graft, plate and screws;  Surgeon: Jessy Oto, MD;  Location: [facility];  Service: Orthopedics;  Laterality: N/A;  . APPENDECTOMY    . BREAST LUMPECTOMY Left   . COLONOSCOPY    . EYE SURGERY Bilateral    cataracts  . LUMBAR SPINE SURGERY  2011   Removal of hematoma  . ORIF HUMERUS FRACTURE Left 2013  . THORACIC SPINE SURGERY  2011   Bone cages  . THYROIDECTOMY    . TONSILLECTOMY     Social History   Occupational History  . Retired    Social History Main Topics  . Smoking status: Former Smoker    Types: Cigarettes    Quit date: 04/23/1962  . Smokeless tobacco: Never Used     Comment: as teenager  . Alcohol use Yes     Comment:  Social  . Drug use: No  . Sexual activity: Not on file

## 2017-03-10 DIAGNOSIS — C50912 Malignant neoplasm of unspecified site of left female breast: Secondary | ICD-10-CM | POA: Diagnosis not present

## 2017-03-10 DIAGNOSIS — C7800 Secondary malignant neoplasm of unspecified lung: Secondary | ICD-10-CM | POA: Diagnosis not present

## 2017-03-10 DIAGNOSIS — C787 Secondary malignant neoplasm of liver and intrahepatic bile duct: Secondary | ICD-10-CM | POA: Diagnosis not present

## 2017-03-10 DIAGNOSIS — Z5111 Encounter for antineoplastic chemotherapy: Secondary | ICD-10-CM | POA: Diagnosis not present

## 2017-03-31 DIAGNOSIS — C50912 Malignant neoplasm of unspecified site of left female breast: Secondary | ICD-10-CM | POA: Diagnosis not present

## 2017-03-31 DIAGNOSIS — Z5111 Encounter for antineoplastic chemotherapy: Secondary | ICD-10-CM | POA: Diagnosis not present

## 2017-03-31 DIAGNOSIS — C787 Secondary malignant neoplasm of liver and intrahepatic bile duct: Secondary | ICD-10-CM | POA: Diagnosis not present

## 2017-03-31 DIAGNOSIS — C7800 Secondary malignant neoplasm of unspecified lung: Secondary | ICD-10-CM | POA: Diagnosis not present

## 2017-04-11 DIAGNOSIS — M5137 Other intervertebral disc degeneration, lumbosacral region: Secondary | ICD-10-CM | POA: Diagnosis not present

## 2017-04-11 DIAGNOSIS — E039 Hypothyroidism, unspecified: Secondary | ICD-10-CM | POA: Diagnosis not present

## 2017-04-11 DIAGNOSIS — I10 Essential (primary) hypertension: Secondary | ICD-10-CM | POA: Diagnosis not present

## 2017-04-11 DIAGNOSIS — M810 Age-related osteoporosis without current pathological fracture: Secondary | ICD-10-CM | POA: Diagnosis not present

## 2017-04-11 DIAGNOSIS — F331 Major depressive disorder, recurrent, moderate: Secondary | ICD-10-CM | POA: Diagnosis not present

## 2017-04-21 DIAGNOSIS — C787 Secondary malignant neoplasm of liver and intrahepatic bile duct: Secondary | ICD-10-CM | POA: Diagnosis not present

## 2017-04-21 DIAGNOSIS — C50912 Malignant neoplasm of unspecified site of left female breast: Secondary | ICD-10-CM | POA: Diagnosis not present

## 2017-04-21 DIAGNOSIS — C7800 Secondary malignant neoplasm of unspecified lung: Secondary | ICD-10-CM | POA: Diagnosis not present

## 2017-04-21 DIAGNOSIS — Z5111 Encounter for antineoplastic chemotherapy: Secondary | ICD-10-CM | POA: Diagnosis not present

## 2017-04-27 ENCOUNTER — Ambulatory Visit (INDEPENDENT_AMBULATORY_CARE_PROVIDER_SITE_OTHER): Payer: Medicare Other | Admitting: Specialist

## 2017-05-12 DIAGNOSIS — C50912 Malignant neoplasm of unspecified site of left female breast: Secondary | ICD-10-CM | POA: Diagnosis not present

## 2017-05-12 DIAGNOSIS — C7800 Secondary malignant neoplasm of unspecified lung: Secondary | ICD-10-CM | POA: Diagnosis not present

## 2017-05-12 DIAGNOSIS — Z5111 Encounter for antineoplastic chemotherapy: Secondary | ICD-10-CM | POA: Diagnosis not present

## 2017-05-12 DIAGNOSIS — C787 Secondary malignant neoplasm of liver and intrahepatic bile duct: Secondary | ICD-10-CM | POA: Diagnosis not present

## 2017-05-24 DIAGNOSIS — Z23 Encounter for immunization: Secondary | ICD-10-CM | POA: Diagnosis not present

## 2017-06-02 DIAGNOSIS — C787 Secondary malignant neoplasm of liver and intrahepatic bile duct: Secondary | ICD-10-CM | POA: Diagnosis not present

## 2017-06-02 DIAGNOSIS — C7800 Secondary malignant neoplasm of unspecified lung: Secondary | ICD-10-CM | POA: Diagnosis not present

## 2017-06-02 DIAGNOSIS — Z5111 Encounter for antineoplastic chemotherapy: Secondary | ICD-10-CM | POA: Diagnosis not present

## 2017-06-02 DIAGNOSIS — C50912 Malignant neoplasm of unspecified site of left female breast: Secondary | ICD-10-CM | POA: Diagnosis not present

## 2017-06-08 ENCOUNTER — Encounter (INDEPENDENT_AMBULATORY_CARE_PROVIDER_SITE_OTHER): Payer: Self-pay | Admitting: Specialist

## 2017-06-08 ENCOUNTER — Ambulatory Visit (INDEPENDENT_AMBULATORY_CARE_PROVIDER_SITE_OTHER): Payer: Medicare Other | Admitting: Specialist

## 2017-06-08 VITALS — BP 166/71 | HR 62 | Ht 61.0 in | Wt 142.0 lb

## 2017-06-08 DIAGNOSIS — M47895 Other spondylosis, thoracolumbar region: Secondary | ICD-10-CM

## 2017-06-08 DIAGNOSIS — M7062 Trochanteric bursitis, left hip: Secondary | ICD-10-CM

## 2017-06-08 MED ORDER — METHYLPREDNISOLONE ACETATE 40 MG/ML IJ SUSP
40.0000 mg | INTRAMUSCULAR | Status: AC | PRN
  Administered 2017-06-08: 40 mg via INTRA_ARTICULAR

## 2017-06-08 MED ORDER — BUPIVACAINE HCL 0.25 % IJ SOLN
4.0000 mL | INTRAMUSCULAR | Status: AC | PRN
  Administered 2017-06-08: 4 mL via INTRA_ARTICULAR

## 2017-06-08 NOTE — Progress Notes (Signed)
Office Visit Note   Patient: Karen Allison           Date of Birth: [birth year]           MRN: [id number] Visit Date: 06/08/2017              Requested by: [name], MD [address] [place], [place] [address] PCP: [name], MD   Assessment & Plan: Visit Diagnoses:  1. Greater trochanteric bursitis, left   2. Other spondylosis, thoracolumbar region     Plan: Avoid bending, stooping and avoid lifting weights greater than 10 lbs. Avoid prolong standing and walking. Avoid frequent bending and stooping  No lifting greater than 10 lbs. May use ice or moist heat for pain. Weight loss is of benefit. Handicap license is approved  Follow-Up Instructions: Return in about 6 weeks (around 07/20/2017).   Orders:  Orders Placed This Encounter  Procedures  . Large Joint Injection/Arthrocentesis   No orders of the defined types were placed in this encounter.     Procedures: Large Joint Inj Date/Time: 06/08/2017 3:02 PM Performed by: Jessy Oto Authorized by: Jessy Oto   Consent Given by:  Patient Site marked: the procedure site was marked   Timeout: prior to procedure the correct patient, procedure, and site was verified   Indications:  Pain Location:  Hip Site:  L greater trochanter Prep: patient was prepped and draped in usual sterile fashion   Needle Size:  22 G Needle Length:  3.5 inches Approach:  Superolateral Ultrasound Guidance: No   Fluoroscopic Guidance: No   Arthrogram: No   Medications:  40 mg methylPREDNISolone acetate 40 MG/ML; 4 mL bupivacaine 0.25 % Aspiration Attempted: Yes   Patient tolerance:  Patient tolerated the procedure well with no immediate complications  bandiad applied.     Clinical Data: No additional findings.   Subjective: Chief Complaint  Patient presents with  . Left Leg - Follow-up    71 year old female with left hip pain persists with lying on the left side. She  Is status post lumbar decompression  and fusion for recurring foramenal stenosis and spinal stenosis. Post fusion lumbar spine and an ACDF C3-4. Pain localizes to the left greater trochanter and is present with walking and lying on this side.     Review of Systems  Constitutional: Negative.   HENT: Negative.   Eyes: Negative.   Respiratory: Negative.   Cardiovascular: Negative.   Gastrointestinal: Negative.   Endocrine: Negative.   Genitourinary: Negative.   Musculoskeletal: Negative.   Skin: Negative.   Allergic/Immunologic: Negative.   Neurological: Negative.   Hematological: Negative.   Psychiatric/Behavioral: Negative.      Objective: Vital Signs: BP (!) 166/71 (BP Location: Left Arm, Patient Position: Sitting)   Pulse 62   Ht 5\' 1"  (1.549 m)   Wt 142 lb (64.4 kg)   BMI 26.83 kg/m   Physical Exam  Constitutional: She is oriented to person, place, and time. She appears well-developed and well-nourished.  HENT:  Head: Normocephalic and atraumatic.  Eyes: Pupils are equal, round, and reactive to light. EOM are normal.  Neck: Normal range of motion. Neck supple.  Pulmonary/Chest: Effort normal and breath sounds normal.  Abdominal: Soft. Bowel sounds are normal.  Neurological: She is alert and oriented to person, place, and time.  Skin: Skin is warm and dry.  Psychiatric: She has a normal mood and affect. Her behavior is normal. Judgment and thought content normal.  Right Hip Exam   Tenderness  The patient is experiencing tenderness in the greater trochanter.  Range of Motion  Extension: normal  Flexion: normal  Internal Rotation: normal  External Rotation: normal  Abduction: abnormal  Adduction: normal   Muscle Strength  Abduction: 4/5  Adduction: 5/5  Flexion: 5/5   Tests  Ober: positive  Other  Erythema: absent Scars: absent Sensation: normal Pulse: present   Left Hip Exam   Tenderness  The patient is experiencing tenderness in the greater trochanter.  Range of Motion    Extension: normal  Flexion: normal  Internal Rotation: normal  External Rotation: normal  Adduction: normal   Muscle Strength  Abduction: 4/5  Adduction: 5/5  Flexion: 5/5   Tests  Ober: positive  Other  Erythema: absent Scars: absent Sensation: normal Pulse: present   Back Exam   Tenderness  The patient is experiencing tenderness in the lumbar.  Range of Motion  Extension: abnormal  Flexion: normal  Lateral Bend Right: abnormal  Lateral Bend Left: abnormal  Rotation Right: abnormal  Rotation Left: abnormal   Muscle Strength  Right Quadriceps:  5/5  Left Quadriceps:  5/5  Right Hamstrings:  5/5  Left Hamstrings:  5/5   Tests  Straight leg raise right: negative Straight leg raise left: negative  Other  Toe Walk: abnormal Heel Walk: abnormal Sensation: normal Erythema: no back redness Scars: present      Specialty Comments:  No specialty comments available.  Imaging: No results found.   PMFS History: Patient Active Problem List   Diagnosis Date Noted  . Other forms of scoliosis, thoracolumbar region 11/03/2016    Priority: High    Class: Chronic  . Spinal stenosis in cervical region 05/06/2014    Priority: High    Class: Chronic  . Cervical spondylosis without myelopathy 05/06/2014    Priority: High  . Gait disorder 07/05/2016  . HCAP (healthcare-associated pneumonia) 05/09/2014  . Acute respiratory failure with hypoxia ([facility]) 05/09/2014  . Pneumonia 05/09/2014  . Hypertension   . Hypothyroidism   . Breast cancer ([facility])   . Cervical stenosis (uterine cervix) 05/07/2014  . HNP (herniated nucleus pulposus), cervical 05/06/2014  . Adult hypothyroidism 05/03/2013  . 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 Primary malignant neoplasm of breast ([facility]) 10/24/2002   Past Medical History:  Diagnosis Date  . Arthritis   . Blood dyscrasia    bleeds and bruises easily  . Breast cancer ([facility])   . Gait disorder 07/05/2016  . Hepatitis      Hep A years ago  . Hypertension   . Hypothyroidism    Graves disease, age 64 yo  . Nose trouble    right nostril will hemorrhage at times  . Pneumonia     Family History  Problem Relation Age of Onset  . Congestive Heart Failure Mother   . Heart disease Father   . Tongue cancer Grandchild     Past Surgical History:  Procedure Laterality Date  . ABDOMINAL HYSTERECTOMY     Age 89  . ANTERIOR CERVICAL DECOMP/DISCECTOMY FUSION N/A 05/06/2014   Procedure: ANTERIOR CERVICAL DISCECTOMY FUSION C3-4 with transgraft, local bone graft, plate and screws;  Surgeon: Jessy Oto, MD;  Location: [facility];  Service: Orthopedics;  Laterality: N/A;  . APPENDECTOMY    . BREAST LUMPECTOMY Left   . COLONOSCOPY    . EYE SURGERY Bilateral    cataracts  . LUMBAR SPINE SURGERY  2011   Removal of hematoma  . ORIF  HUMERUS FRACTURE Left 2013  . THORACIC SPINE SURGERY  2011   Bone cages  . THYROIDECTOMY    . TONSILLECTOMY     Social History   Occupational History  . Retired    Social History Main Topics  . Smoking status: Former Smoker    Types: Cigarettes    Quit date: 04/23/1962  . Smokeless tobacco: Never Used     Comment: as teenager  . Alcohol use Yes     Comment: Social  . Drug use: No  . Sexual activity: Not on file

## 2017-06-08 NOTE — Patient Instructions (Signed)
Avoid bending, stooping and avoid lifting weights greater than 10 lbs. Avoid prolong standing and walking. Avoid frequent bending and stooping  No lifting greater than 10 lbs. May use ice or moist heat for pain. Weight loss is of benefit. Handicap license is approved.  

## 2017-06-20 DIAGNOSIS — L299 Pruritus, unspecified: Secondary | ICD-10-CM | POA: Diagnosis not present

## 2017-06-20 DIAGNOSIS — B079 Viral wart, unspecified: Secondary | ICD-10-CM | POA: Diagnosis not present

## 2017-06-20 DIAGNOSIS — L300 Nummular dermatitis: Secondary | ICD-10-CM | POA: Diagnosis not present

## 2017-06-23 DIAGNOSIS — Z5111 Encounter for antineoplastic chemotherapy: Secondary | ICD-10-CM | POA: Diagnosis not present

## 2017-06-23 DIAGNOSIS — C7800 Secondary malignant neoplasm of unspecified lung: Secondary | ICD-10-CM | POA: Diagnosis not present

## 2017-06-23 DIAGNOSIS — C50912 Malignant neoplasm of unspecified site of left female breast: Secondary | ICD-10-CM | POA: Diagnosis not present

## 2017-06-23 DIAGNOSIS — C787 Secondary malignant neoplasm of liver and intrahepatic bile duct: Secondary | ICD-10-CM | POA: Diagnosis not present

## 2017-07-07 DIAGNOSIS — R21 Rash and other nonspecific skin eruption: Secondary | ICD-10-CM | POA: Diagnosis not present

## 2017-07-12 DIAGNOSIS — C7800 Secondary malignant neoplasm of unspecified lung: Secondary | ICD-10-CM | POA: Diagnosis not present

## 2017-07-12 DIAGNOSIS — C787 Secondary malignant neoplasm of liver and intrahepatic bile duct: Secondary | ICD-10-CM | POA: Diagnosis not present

## 2017-07-12 DIAGNOSIS — C50912 Malignant neoplasm of unspecified site of left female breast: Secondary | ICD-10-CM | POA: Diagnosis not present

## 2017-07-12 DIAGNOSIS — Z5111 Encounter for antineoplastic chemotherapy: Secondary | ICD-10-CM | POA: Diagnosis not present

## 2017-07-17 DIAGNOSIS — L719 Rosacea, unspecified: Secondary | ICD-10-CM | POA: Diagnosis not present

## 2017-07-20 ENCOUNTER — Encounter (INDEPENDENT_AMBULATORY_CARE_PROVIDER_SITE_OTHER): Payer: Self-pay | Admitting: Specialist

## 2017-07-20 ENCOUNTER — Ambulatory Visit (INDEPENDENT_AMBULATORY_CARE_PROVIDER_SITE_OTHER): Payer: Medicare Other | Admitting: Specialist

## 2017-07-20 VITALS — BP 145/62 | HR 57 | Ht 61.0 in | Wt 142.0 lb

## 2017-07-20 DIAGNOSIS — M48062 Spinal stenosis, lumbar region with neurogenic claudication: Secondary | ICD-10-CM

## 2017-07-20 DIAGNOSIS — R29818 Other symptoms and signs involving the nervous system: Secondary | ICD-10-CM

## 2017-07-20 NOTE — Progress Notes (Addendum)
Office Visit Note   Patient: Karen Allison           Date of Birth: 1946-08-03           MRN: [id number] Visit Date: 07/20/2017              Requested by: [name], MD [address] [place], [place] [address] PCP: [name], MD   Assessment & Plan: Visit Diagnoses:  1. Spinal stenosis of lumbar region with neurogenic claudication   2. Neurological deficit present   Stable today with very little pain complaints will see her in 3 months or as needed.   Plan:Avoid bending, stooping and avoid lifting weights greater than 10 lbs. Avoid prolong standing and walking. Avoid frequent bending and stooping  No lifting greater than 10 lbs. May use ice or moist heat for pain. Weight loss is of benefit. Handicap license is approved.  Fall Prevention and Home Safety Falls cause injuries and can affect all age groups. It is possible to use preventive measures to significantly decrease the likelihood of falls. There are many simple measures which can make your home safer and prevent falls. OUTDOORS  Repair cracks and edges of walkways and driveways.  Remove high doorway thresholds.  Trim shrubbery on the main path into your home.  Have good outside lighting.  Clear walkways of tools, rocks, debris, and clutter.  Check that handrails are not broken and are securely fastened. Both sides of steps should have handrails.  Have leaves, snow, and ice cleared regularly.  Use sand or salt on walkways during winter months.  In the garage, clean up grease or oil spills. BATHROOM  Install night lights.  Install grab bars by the toilet and in the tub and shower.  Use non-skid mats or decals in the tub or shower.  Place a plastic non-slip stool in the shower to sit on, if needed.  Keep floors dry and clean up all water on the floor immediately.  Remove soap buildup in the tub or shower on a regular basis.  Secure bath mats with non-slip, double-sided rug tape.  Remove  throw rugs and tripping hazards from the floors. BEDROOMS  Install night lights.  Make sure a bedside light is easy to reach.  Do not use oversized bedding.  Keep a telephone by your bedside.  Have a firm chair with side arms to use for getting dressed.  Remove throw rugs and tripping hazards from the floor. KITCHEN  Keep handles on pots and pans turned toward the center of the stove. Use back burners when possible.  Clean up spills quickly and allow time for drying.  Avoid walking on wet floors.  Avoid hot utensils and knives.  Position shelves so they are not too high or low.  Place commonly used objects within easy reach.  If necessary, use a sturdy step stool with a grab bar when reaching.  Keep electrical cables out of the way.  Do not use floor polish or wax that makes floors slippery. If you must use wax, use non-skid floor wax.  Remove throw rugs and tripping hazards from the floor. STAIRWAYS  Never leave objects on stairs.  Place handrails on both sides of stairways and use them. Fix any loose handrails. Make sure handrails on both sides of the stairways are as long as the stairs.  Check carpeting to make sure it is firmly attached along stairs. Make repairs to worn or loose carpet promptly.  Avoid placing throw rugs  at the top or bottom of stairways, or properly secure the rug with carpet tape to prevent slippage. Get rid of throw rugs, if possible.  Have an electrician put in a light switch at the top and bottom of the stairs. OTHER FALL PREVENTION TIPS  Wear low-heel or rubber-soled shoes that are supportive and fit well. Wear closed toe shoes.  When using a stepladder, make sure it is fully opened and both spreaders are firmly locked. Do not climb a closed stepladder.  Add color or contrast paint or tape to grab bars and handrails in your home. Place contrasting color strips on first and last steps.  Learn and use mobility aids as needed. Install an  electrical emergency response system.  Turn on lights to avoid dark areas. Replace light bulbs that burn out immediately. Get light switches that glow.  Arrange furniture to create clear pathways. Keep furniture in the same place.  Firmly attach carpet with non-skid or double-sided tape.  Eliminate uneven floor surfaces.  Select a carpet pattern that does not visually hide the edge of steps.  Be aware of all pets. OTHER HOME SAFETY TIPS  Set the water temperature for 120 F (48.8 C).  Keep emergency numbers on or near the telephone.  Keep smoke detectors on every level of the home and near sleeping areas. Document Released: 07/29/2002 Document Revised: 02/07/2012 Document Reviewed: 10/28/2011 [facility] Patient Information 2014 [place]. Follow-Up Instructions: No Follow-up on file.   Orders:  No orders of the defined types were placed in this encounter.  No orders of the defined types were placed in this encounter.     Procedures: No procedures performed   Clinical Data: No additional findings.   Subjective: Chief Complaint  Patient presents with  . Lower Back - Follow-up  . Left Hip - Follow-up    71 year old female with history of lumbar degenerative disc disease and multiple level foramenal stenosis, post decompression and fusion with post op complication of epidural hematoma. She is standing and walking has a scoliosis, Her pain overall is better still with some residual trochanteric pain, medications are okay. No bowel or bladder difficulty.     Review of Systems  Constitutional: Positive for activity change, appetite change and unexpected weight change.  HENT: Negative.  Negative for congestion, dental problem, drooling, ear discharge, ear pain, facial swelling, hearing loss, mouth sores, rhinorrhea, sinus pressure, sinus pain, tinnitus, trouble swallowing and voice change.   Eyes: Positive for visual disturbance. Negative for photophobia, pain,  discharge, redness and itching.  Respiratory: Negative for chest tightness and shortness of breath.   Cardiovascular: Positive for palpitations. Negative for leg swelling.  Gastrointestinal: Negative.  Negative for abdominal distention, abdominal pain, anal bleeding, blood in stool, constipation, diarrhea and rectal pain.  Endocrine: Negative.  Negative for cold intolerance, heat intolerance, polydipsia, polyphagia and polyuria.  Genitourinary: Negative.  Negative for difficulty urinating, dyspareunia, dysuria, enuresis, flank pain, frequency, genital sores and hematuria.  Musculoskeletal: Negative for arthralgias and back pain.  Skin: Negative.  Negative for color change, pallor, rash and wound.  Allergic/Immunologic: Negative.  Negative for environmental allergies, food allergies and immunocompromised state.  Neurological: Negative for dizziness, seizures, syncope, facial asymmetry, speech difficulty, light-headedness, numbness and headaches.  Hematological: Negative.  Does not bruise/bleed easily.  Psychiatric/Behavioral: Negative.  Negative for confusion, decreased concentration, dysphoric mood, hallucinations, self-injury, sleep disturbance and suicidal ideas. The patient is not nervous/anxious and is not hyperactive.      Objective: Vital Signs: BP Marland Kitchen)  145/62 (BP Location: Left Arm, Patient Position: Sitting)   Pulse (!) 57   Ht 5\' 1"  (1.549 m)   Wt 142 lb (64.4 kg)   BMI 26.83 kg/m   Physical Exam  Constitutional: She is oriented to person, place, and time. She appears well-developed and well-nourished.  HENT:  Head: Normocephalic and atraumatic.  Eyes: EOM are normal. Pupils are equal, round, and reactive to light.  Neck: Normal range of motion. Neck supple.  Pulmonary/Chest: Effort normal and breath sounds normal.  Abdominal: Soft. Bowel sounds are normal.  Neurological: She is alert and oriented to person, place, and time.  Skin: Skin is warm and dry.  Psychiatric: She has  a normal mood and affect. Her behavior is normal. Judgment and thought content normal.    Back Exam   Tenderness  The patient is experiencing tenderness in the lumbar.  Range of Motion  Extension: abnormal  Flexion: abnormal  Lateral bend right: abnormal  Lateral bend left: abnormal  Rotation right: abnormal  Rotation left: abnormal   Muscle Strength  Right Quadriceps:  5/5  Left Quadriceps:  5/5  Right Hamstrings:  5/5  Left Hamstrings:  5/5   Tests  Straight leg raise right: negative Straight leg raise left: negative  Reflexes  Patellar: abnormal Achilles: abnormal Babinski's sign: normal   Other  Toe walk: abnormal Heel walk: abnormal Sensation: normal Gait: abnormal  Erythema: no back redness Scars: present      Specialty Comments:  No specialty comments available.  Imaging: No results found.   PMFS History: Patient Active Problem List   Diagnosis Date Noted  . Other forms of scoliosis, thoracolumbar region 11/03/2016    Priority: High    Class: Chronic  . Spinal stenosis in cervical region 05/06/2014    Priority: High    Class: Chronic  . Cervical spondylosis without myelopathy 05/06/2014    Priority: High  . Gait disorder 07/05/2016  . HCAP (healthcare-associated pneumonia) 05/09/2014  . Acute respiratory failure with hypoxia ([place]) 05/09/2014  . Pneumonia 05/09/2014  . Hypertension   . Hypothyroidism   . Breast cancer ([place])   . Cervical stenosis (uterine cervix) 05/07/2014  . HNP (herniated nucleus pulposus), cervical 05/06/2014  . Adult hypothyroidism 05/03/2013  . 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 Primary malignant neoplasm of breast ([place]) 10/24/2002   Past Medical History:  Diagnosis Date  . Arthritis   . Blood dyscrasia    bleeds and bruises easily  . Breast cancer ([place])   . Gait disorder 07/05/2016  . Hepatitis     Hep A years ago  . Hypertension   . Hypothyroidism    Graves disease, age 46 yo  . Nose trouble     right nostril will hemorrhage at times  . Pneumonia     Family History  Problem Relation Age of Onset  . Congestive Heart Failure Mother   . Heart disease Father   . Tongue cancer Grandchild     Past Surgical History:  Procedure Laterality Date  . ABDOMINAL HYSTERECTOMY     Age 60  . ANTERIOR CERVICAL DECOMP/DISCECTOMY FUSION N/A 05/06/2014   Procedure: ANTERIOR CERVICAL DISCECTOMY FUSION C3-4 with transgraft, local bone graft, plate and screws;  Surgeon: Jessy Oto, MD;  Location: [facility];  Service: Orthopedics;  Laterality: N/A;  . APPENDECTOMY    . BREAST LUMPECTOMY Left   . COLONOSCOPY    . EYE SURGERY Bilateral    cataracts  . LUMBAR SPINE SURGERY  2011  Removal of hematoma  . ORIF HUMERUS FRACTURE Left 2013  . THORACIC SPINE SURGERY  2011   Bone cages  . THYROIDECTOMY    . TONSILLECTOMY     Social History   Occupational History  . Occupation: Retired  Tobacco Use  . Smoking status: Former Smoker    Types: Cigarettes    Last attempt to quit: 04/23/1962    Years since quitting: 55.2  . Smokeless tobacco: Never Used  . Tobacco comment: as teenager  Substance and Sexual Activity  . Alcohol use: Yes    Comment: Social  . Drug use: No  . Sexual activity: Not on file

## 2017-07-20 NOTE — Patient Instructions (Addendum)
Avoid bending, stooping and avoid lifting weights greater than 10 lbs. Avoid prolong standing and walking. Avoid frequent bending and stooping  No lifting greater than 10 lbs. May use ice or moist heat for pain. Weight loss is of benefit. Handicap license is approved. Fall Prevention and Home Safety Falls cause injuries and can affect all age groups. It is possible to use preventive measures to significantly decrease the likelihood of falls. There are many simple measures which can make your home safer and prevent falls. OUTDOORS  Repair cracks and edges of walkways and driveways.  Remove high doorway thresholds.  Trim shrubbery on the main path into your home.  Have good outside lighting.  Clear walkways of tools, rocks, debris, and clutter.  Check that handrails are not broken and are securely fastened. Both sides of steps should have handrails.  Have leaves, snow, and ice cleared regularly.  Use sand or salt on walkways during winter months.  In the garage, clean up grease or oil spills. BATHROOM  Install night lights.  Install grab bars by the toilet and in the tub and shower.  Use non-skid mats or decals in the tub or shower.  Place a plastic non-slip stool in the shower to sit on, if needed.  Keep floors dry and clean up all water on the floor immediately.  Remove soap buildup in the tub or shower on a regular basis.  Secure bath mats with non-slip, double-sided rug tape.  Remove throw rugs and tripping hazards from the floors. BEDROOMS  Install night lights.  Make sure a bedside light is easy to reach.  Do not use oversized bedding.  Keep a telephone by your bedside.  Have a firm chair with side arms to use for getting dressed.  Remove throw rugs and tripping hazards from the floor. KITCHEN  Keep handles on pots and pans turned toward the center of the stove. Use back burners when possible.  Clean up spills quickly and allow time for  drying.  Avoid walking on wet floors.  Avoid hot utensils and knives.  Position shelves so they are not too high or low.  Place commonly used objects within easy reach.  If necessary, use a sturdy step stool with a grab bar when reaching.  Keep electrical cables out of the way.  Do not use floor polish or wax that makes floors slippery. If you must use wax, use non-skid floor wax.  Remove throw rugs and tripping hazards from the floor. STAIRWAYS  Never leave objects on stairs.  Place handrails on both sides of stairways and use them. Fix any loose handrails. Make sure handrails on both sides of the stairways are as long as the stairs.  Check carpeting to make sure it is firmly attached along stairs. Make repairs to worn or loose carpet promptly.  Avoid placing throw rugs at the top or bottom of stairways, or properly secure the rug with carpet tape to prevent slippage. Get rid of throw rugs, if possible.  Have an electrician put in a light switch at the top and bottom of the stairs. OTHER FALL PREVENTION TIPS  Wear low-heel or rubber-soled shoes that are supportive and fit well. Wear closed toe shoes.  When using a stepladder, make sure it is fully opened and both spreaders are firmly locked. Do not climb a closed stepladder.  Add color or contrast paint or tape to grab bars and handrails in your home. Place contrasting color strips on first and last steps.  Learn  and use mobility aids as needed. Install an electrical emergency response system.  Turn on lights to avoid dark areas. Replace light bulbs that burn out immediately. Get light switches that glow.  Arrange furniture to create clear pathways. Keep furniture in the same place.  Firmly attach carpet with non-skid or double-sided tape.  Eliminate uneven floor surfaces.  Select a carpet pattern that does not visually hide the edge of steps.  Be aware of all pets. OTHER HOME SAFETY TIPS  Set the water temperature  for 120 F (48.8 C).  Keep emergency numbers on or near the telephone.  Keep smoke detectors on every level of the home and near sleeping areas. Document Released: 07/29/2002 Document Revised: 02/07/2012 Document Reviewed: 10/28/2011 [facility] Patient Information 2014 [place].

## 2017-07-26 DIAGNOSIS — L719 Rosacea, unspecified: Secondary | ICD-10-CM | POA: Diagnosis not present

## 2017-07-26 DIAGNOSIS — L219 Seborrheic dermatitis, unspecified: Secondary | ICD-10-CM | POA: Diagnosis not present

## 2017-07-26 DIAGNOSIS — H04123 Dry eye syndrome of bilateral lacrimal glands: Secondary | ICD-10-CM | POA: Diagnosis not present

## 2017-07-26 DIAGNOSIS — H01002 Unspecified blepharitis right lower eyelid: Secondary | ICD-10-CM | POA: Diagnosis not present

## 2017-07-26 DIAGNOSIS — H5789 Other specified disorders of eye and adnexa: Secondary | ICD-10-CM | POA: Diagnosis not present

## 2017-08-04 DIAGNOSIS — C787 Secondary malignant neoplasm of liver and intrahepatic bile duct: Secondary | ICD-10-CM | POA: Diagnosis not present

## 2017-08-04 DIAGNOSIS — Z5111 Encounter for antineoplastic chemotherapy: Secondary | ICD-10-CM | POA: Diagnosis not present

## 2017-08-04 DIAGNOSIS — M8589 Other specified disorders of bone density and structure, multiple sites: Secondary | ICD-10-CM | POA: Diagnosis not present

## 2017-08-04 DIAGNOSIS — Z1231 Encounter for screening mammogram for malignant neoplasm of breast: Secondary | ICD-10-CM | POA: Diagnosis not present

## 2017-08-04 DIAGNOSIS — C7800 Secondary malignant neoplasm of unspecified lung: Secondary | ICD-10-CM | POA: Diagnosis not present

## 2017-08-04 DIAGNOSIS — M810 Age-related osteoporosis without current pathological fracture: Secondary | ICD-10-CM | POA: Diagnosis not present

## 2017-08-04 DIAGNOSIS — C50912 Malignant neoplasm of unspecified site of left female breast: Secondary | ICD-10-CM | POA: Diagnosis not present

## 2017-08-24 DIAGNOSIS — M5137 Other intervertebral disc degeneration, lumbosacral region: Secondary | ICD-10-CM | POA: Diagnosis not present

## 2017-08-24 DIAGNOSIS — E039 Hypothyroidism, unspecified: Secondary | ICD-10-CM | POA: Diagnosis not present

## 2017-08-24 DIAGNOSIS — R5383 Other fatigue: Secondary | ICD-10-CM | POA: Diagnosis not present

## 2017-08-24 DIAGNOSIS — I10 Essential (primary) hypertension: Secondary | ICD-10-CM | POA: Diagnosis not present

## 2017-08-25 DIAGNOSIS — Z5111 Encounter for antineoplastic chemotherapy: Secondary | ICD-10-CM | POA: Diagnosis not present

## 2017-08-25 DIAGNOSIS — C50912 Malignant neoplasm of unspecified site of left female breast: Secondary | ICD-10-CM | POA: Diagnosis not present

## 2017-08-25 DIAGNOSIS — C787 Secondary malignant neoplasm of liver and intrahepatic bile duct: Secondary | ICD-10-CM | POA: Diagnosis not present

## 2017-08-25 DIAGNOSIS — C7800 Secondary malignant neoplasm of unspecified lung: Secondary | ICD-10-CM | POA: Diagnosis not present

## 2017-09-12 DIAGNOSIS — H04123 Dry eye syndrome of bilateral lacrimal glands: Secondary | ICD-10-CM | POA: Diagnosis not present

## 2017-09-13 DIAGNOSIS — L218 Other seborrheic dermatitis: Secondary | ICD-10-CM | POA: Diagnosis not present

## 2017-09-13 DIAGNOSIS — L718 Other rosacea: Secondary | ICD-10-CM | POA: Diagnosis not present

## 2017-09-15 DIAGNOSIS — Z515 Encounter for palliative care: Secondary | ICD-10-CM | POA: Diagnosis not present

## 2017-09-15 DIAGNOSIS — C50919 Malignant neoplasm of unspecified site of unspecified female breast: Secondary | ICD-10-CM | POA: Diagnosis not present

## 2017-09-15 DIAGNOSIS — E039 Hypothyroidism, unspecified: Secondary | ICD-10-CM | POA: Diagnosis not present

## 2017-09-15 DIAGNOSIS — C50912 Malignant neoplasm of unspecified site of left female breast: Secondary | ICD-10-CM | POA: Diagnosis not present

## 2017-09-15 DIAGNOSIS — C7800 Secondary malignant neoplasm of unspecified lung: Secondary | ICD-10-CM | POA: Diagnosis not present

## 2017-09-15 DIAGNOSIS — C787 Secondary malignant neoplasm of liver and intrahepatic bile duct: Secondary | ICD-10-CM | POA: Diagnosis not present

## 2017-09-26 DIAGNOSIS — N390 Urinary tract infection, site not specified: Secondary | ICD-10-CM | POA: Diagnosis not present

## 2017-09-26 DIAGNOSIS — L719 Rosacea, unspecified: Secondary | ICD-10-CM | POA: Diagnosis not present

## 2017-10-06 DIAGNOSIS — Z5111 Encounter for antineoplastic chemotherapy: Secondary | ICD-10-CM | POA: Diagnosis not present

## 2017-10-06 DIAGNOSIS — C50912 Malignant neoplasm of unspecified site of left female breast: Secondary | ICD-10-CM | POA: Diagnosis not present

## 2017-10-06 DIAGNOSIS — C787 Secondary malignant neoplasm of liver and intrahepatic bile duct: Secondary | ICD-10-CM | POA: Diagnosis not present

## 2017-10-06 DIAGNOSIS — C7800 Secondary malignant neoplasm of unspecified lung: Secondary | ICD-10-CM | POA: Diagnosis not present

## 2017-10-26 ENCOUNTER — Ambulatory Visit (INDEPENDENT_AMBULATORY_CARE_PROVIDER_SITE_OTHER): Payer: Medicare Other | Admitting: Specialist

## 2017-10-26 DIAGNOSIS — H04123 Dry eye syndrome of bilateral lacrimal glands: Secondary | ICD-10-CM | POA: Diagnosis not present

## 2017-10-26 DIAGNOSIS — H524 Presbyopia: Secondary | ICD-10-CM | POA: Diagnosis not present

## 2017-10-26 DIAGNOSIS — H018 Other specified inflammations of eyelid: Secondary | ICD-10-CM | POA: Diagnosis not present

## 2017-10-27 DIAGNOSIS — C50912 Malignant neoplasm of unspecified site of left female breast: Secondary | ICD-10-CM | POA: Diagnosis not present

## 2017-10-27 DIAGNOSIS — C787 Secondary malignant neoplasm of liver and intrahepatic bile duct: Secondary | ICD-10-CM | POA: Diagnosis not present

## 2017-10-27 DIAGNOSIS — Z5111 Encounter for antineoplastic chemotherapy: Secondary | ICD-10-CM | POA: Diagnosis not present

## 2017-10-27 DIAGNOSIS — C7800 Secondary malignant neoplasm of unspecified lung: Secondary | ICD-10-CM | POA: Diagnosis not present

## 2017-10-30 DIAGNOSIS — L719 Rosacea, unspecified: Secondary | ICD-10-CM | POA: Diagnosis not present

## 2017-10-30 DIAGNOSIS — L299 Pruritus, unspecified: Secondary | ICD-10-CM | POA: Diagnosis not present

## 2017-10-30 DIAGNOSIS — L570 Actinic keratosis: Secondary | ICD-10-CM | POA: Diagnosis not present

## 2017-11-14 DIAGNOSIS — N390 Urinary tract infection, site not specified: Secondary | ICD-10-CM | POA: Diagnosis not present

## 2017-11-17 DIAGNOSIS — Z5111 Encounter for antineoplastic chemotherapy: Secondary | ICD-10-CM | POA: Diagnosis not present

## 2017-11-17 DIAGNOSIS — C787 Secondary malignant neoplasm of liver and intrahepatic bile duct: Secondary | ICD-10-CM | POA: Diagnosis not present

## 2017-11-17 DIAGNOSIS — C7800 Secondary malignant neoplasm of unspecified lung: Secondary | ICD-10-CM | POA: Diagnosis not present

## 2017-11-17 DIAGNOSIS — C50912 Malignant neoplasm of unspecified site of left female breast: Secondary | ICD-10-CM | POA: Diagnosis not present

## 2017-11-30 ENCOUNTER — Ambulatory Visit (INDEPENDENT_AMBULATORY_CARE_PROVIDER_SITE_OTHER): Payer: Medicare Other | Admitting: Specialist

## 2017-12-06 DIAGNOSIS — C50912 Malignant neoplasm of unspecified site of left female breast: Secondary | ICD-10-CM | POA: Diagnosis not present

## 2017-12-06 DIAGNOSIS — C7800 Secondary malignant neoplasm of unspecified lung: Secondary | ICD-10-CM | POA: Diagnosis not present

## 2017-12-06 DIAGNOSIS — Z5111 Encounter for antineoplastic chemotherapy: Secondary | ICD-10-CM | POA: Diagnosis not present

## 2017-12-06 DIAGNOSIS — C787 Secondary malignant neoplasm of liver and intrahepatic bile duct: Secondary | ICD-10-CM | POA: Diagnosis not present

## 2017-12-26 DIAGNOSIS — L719 Rosacea, unspecified: Secondary | ICD-10-CM | POA: Diagnosis not present

## 2017-12-26 DIAGNOSIS — E039 Hypothyroidism, unspecified: Secondary | ICD-10-CM | POA: Diagnosis not present

## 2017-12-26 DIAGNOSIS — K5900 Constipation, unspecified: Secondary | ICD-10-CM | POA: Diagnosis not present

## 2017-12-26 DIAGNOSIS — M5137 Other intervertebral disc degeneration, lumbosacral region: Secondary | ICD-10-CM | POA: Diagnosis not present

## 2017-12-26 DIAGNOSIS — I10 Essential (primary) hypertension: Secondary | ICD-10-CM | POA: Diagnosis not present

## 2017-12-29 DIAGNOSIS — C7800 Secondary malignant neoplasm of unspecified lung: Secondary | ICD-10-CM | POA: Diagnosis not present

## 2017-12-29 DIAGNOSIS — Z5111 Encounter for antineoplastic chemotherapy: Secondary | ICD-10-CM | POA: Diagnosis not present

## 2017-12-29 DIAGNOSIS — C787 Secondary malignant neoplasm of liver and intrahepatic bile duct: Secondary | ICD-10-CM | POA: Diagnosis not present

## 2017-12-29 DIAGNOSIS — C50912 Malignant neoplasm of unspecified site of left female breast: Secondary | ICD-10-CM | POA: Diagnosis not present

## 2018-01-10 ENCOUNTER — Encounter (INDEPENDENT_AMBULATORY_CARE_PROVIDER_SITE_OTHER): Payer: Self-pay | Admitting: Specialist

## 2018-01-10 ENCOUNTER — Ambulatory Visit (INDEPENDENT_AMBULATORY_CARE_PROVIDER_SITE_OTHER): Payer: Medicare Other | Admitting: Specialist

## 2018-01-10 VITALS — BP 152/61 | HR 53 | Ht 61.0 in | Wt 134.0 lb

## 2018-01-10 DIAGNOSIS — R200 Anesthesia of skin: Secondary | ICD-10-CM

## 2018-01-10 DIAGNOSIS — M5416 Radiculopathy, lumbar region: Secondary | ICD-10-CM

## 2018-01-10 MED ORDER — DICLOFENAC POTASSIUM 50 MG PO TABS: 50.0000 mg | ORAL_TABLET | Freq: Two times a day (BID) | ORAL | 3 refills | Status: DC

## 2018-01-10 MED ORDER — OMEPRAZOLE 20 MG PO CPDR: 20.0000 mg | DELAYED_RELEASE_CAPSULE | Freq: Two times a day (BID) | ORAL | 3 refills | Status: DC

## 2018-01-10 NOTE — Progress Notes (Signed)
Office Visit Note   Patient: Karen Allison           Date of Birth: [birth year]           MRN: [id number] Visit Date: 01/10/2018              Requested by: [name], MD [address], [place] [address] PCP: [name], MD   Assessment & Plan: Visit Diagnoses:  1. Bilateral leg numbness   2. Chronic lumbar radiculopathy     Plan:Avoid bending, stooping and avoid lifting weights greater than 10 lbs. Avoid prolong standing and walking. Avoid frequent bending and stooping  No lifting greater than 10 lbs. May use ice or moist heat for pain. Weight loss is of benefit. Handicap license is approved.  Follow-Up Instructions: Return in about 3 months (around 04/12/2018).   Orders:  No orders of the defined types were placed in this encounter.  No orders of the defined types were placed in this encounter.     Procedures: No procedures performed   Clinical Data: No additional findings.   Subjective: Chief Complaint  Patient presents with  . Lower Back - Pain, Follow-up    72 year old female with history of lumbar spinal stenosis with scoliosis had a decompressive laminectomy then a long segment fusion. The  She is having numbness in the left leg and it feels as though it is worsening. The legs feel like they are heavy as a hundred pounds. She had post Fusion at [facility] an epidural hematoma. She is on reclast twice a year and she tolerates this well.    Review of Systems  Constitutional: Negative.   HENT: Negative.   Eyes: Negative.   Respiratory: Negative.   Cardiovascular: Negative.   Gastrointestinal: Negative.   Endocrine: Negative.   Genitourinary: Negative.   Musculoskeletal: Negative.   Skin: Negative.   Allergic/Immunologic: Negative.   Neurological: Negative.   Hematological: Negative.   Psychiatric/Behavioral: Negative.      Objective: Vital Signs: BP (!) 152/61   Pulse (!) 53   Ht 5\' 1"  (1.549 m)   Wt 134 lb (60.8 kg)   BMI  25.32 kg/m   Physical Exam  Constitutional: She is oriented to person, place, and time. She appears well-developed and well-nourished.  HENT:  Head: Normocephalic and atraumatic.  Eyes: Pupils are equal, round, and reactive to light. EOM are normal.  Neck: Normal range of motion. Neck supple.  Pulmonary/Chest: Effort normal and breath sounds normal.  Abdominal: Soft. Bowel sounds are normal.  Neurological: She is alert and oriented to person, place, and time.  Skin: Skin is warm and dry.  Psychiatric: She has a normal mood and affect. Her behavior is normal. Judgment and thought content normal.    Back Exam   Tenderness  The patient is experiencing tenderness in the lumbar.  Range of Motion  Extension: abnormal  Flexion: abnormal  Lateral bend right: abnormal  Lateral bend left: abnormal  Rotation right: abnormal  Rotation left: abnormal   Muscle Strength  Right Quadriceps:  4/5  Left Quadriceps:  4/5  Right Hamstrings:  4/5  Left Hamstrings:  4/5   Tests  Straight leg raise right: negative Straight leg raise left: negative  Other  Toe walk: normal Heel walk: normal Sensation: normal Gait: normal       Specialty Comments:  No specialty comments available.  Imaging: No results found.   PMFS History: Patient Active Problem List   Diagnosis Date Noted  .  Other forms of scoliosis, thoracolumbar region 11/03/2016    Priority: High    Class: Chronic  . Spinal stenosis in cervical region 05/06/2014    Priority: High    Class: Chronic  . Cervical spondylosis without myelopathy 05/06/2014    Priority: High  . Gait disorder 07/05/2016  . HCAP (healthcare-associated pneumonia) 05/09/2014  . Acute respiratory failure with hypoxia ([place]) 05/09/2014  . Pneumonia 05/09/2014  . Hypertension   . Hypothyroidism   . Breast cancer ([place])   . Cervical stenosis (uterine cervix) 05/07/2014  . HNP (herniated nucleus pulposus), cervical 05/06/2014  . Adult  hypothyroidism 05/03/2013  . 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 Primary malignant neoplasm of breast ([place]) 10/24/2002   Past Medical History:  Diagnosis Date  . Arthritis   . Blood dyscrasia    bleeds and bruises easily  . Breast cancer ([place])   . Gait disorder 07/05/2016  . Hepatitis     Hep A years ago  . Hypertension   . Hypothyroidism    Graves disease, age 25 yo  . Nose trouble    right nostril will hemorrhage at times  . Pneumonia     Family History  Problem Relation Age of Onset  . Congestive Heart Failure Mother   . Heart disease Father   . Tongue cancer Grandchild     Past Surgical History:  Procedure Laterality Date  . ABDOMINAL HYSTERECTOMY     Age 19  . ANTERIOR CERVICAL DECOMP/DISCECTOMY FUSION N/A 05/06/2014   Procedure: ANTERIOR CERVICAL DISCECTOMY FUSION C3-4 with transgraft, local bone graft, plate and screws;  Surgeon: Jessy Oto, MD;  Location: [facility];  Service: Orthopedics;  Laterality: N/A;  . APPENDECTOMY    . BREAST LUMPECTOMY Left   . COLONOSCOPY    . EYE SURGERY Bilateral    cataracts  . LUMBAR SPINE SURGERY  2011   Removal of hematoma  . ORIF HUMERUS FRACTURE Left 2013  . THORACIC SPINE SURGERY  2011   Bone cages  . THYROIDECTOMY    . TONSILLECTOMY     Social History   Occupational History  . Occupation: Retired  Tobacco Use  . Smoking status: Former Smoker    Types: Cigarettes    Last attempt to quit: 04/23/1962    Years since quitting: 55.7  . Smokeless tobacco: Never Used  . Tobacco comment: as teenager  Substance and Sexual Activity  . Alcohol use: Yes    Comment: Social  . Drug use: No  . Sexual activity: Not on file

## 2018-01-10 NOTE — Patient Instructions (Signed)
Avoid bending, stooping and avoid lifting weights greater than 10 lbs. Avoid prolong standing and walking. Avoid frequent bending and stooping  No lifting greater than 10 lbs. May use ice or moist heat for pain. Weight loss is of benefit. Handicap license is approved.  

## 2018-01-18 DIAGNOSIS — L719 Rosacea, unspecified: Secondary | ICD-10-CM | POA: Diagnosis not present

## 2018-01-18 DIAGNOSIS — L219 Seborrheic dermatitis, unspecified: Secondary | ICD-10-CM | POA: Diagnosis not present

## 2018-01-18 DIAGNOSIS — L720 Epidermal cyst: Secondary | ICD-10-CM | POA: Diagnosis not present

## 2018-01-19 DIAGNOSIS — C50912 Malignant neoplasm of unspecified site of left female breast: Secondary | ICD-10-CM | POA: Diagnosis not present

## 2018-01-19 DIAGNOSIS — C787 Secondary malignant neoplasm of liver and intrahepatic bile duct: Secondary | ICD-10-CM | POA: Diagnosis not present

## 2018-01-19 DIAGNOSIS — Z5111 Encounter for antineoplastic chemotherapy: Secondary | ICD-10-CM | POA: Diagnosis not present

## 2018-01-19 DIAGNOSIS — C7800 Secondary malignant neoplasm of unspecified lung: Secondary | ICD-10-CM | POA: Diagnosis not present

## 2018-02-07 ENCOUNTER — Ambulatory Visit (INDEPENDENT_AMBULATORY_CARE_PROVIDER_SITE_OTHER): Payer: Medicare Other | Admitting: Specialist

## 2018-02-07 DIAGNOSIS — J029 Acute pharyngitis, unspecified: Secondary | ICD-10-CM | POA: Diagnosis not present

## 2018-02-07 DIAGNOSIS — J40 Bronchitis, not specified as acute or chronic: Secondary | ICD-10-CM | POA: Diagnosis not present

## 2018-02-09 ENCOUNTER — Ambulatory Visit (HOSPITAL_COMMUNITY)
Admission: EM | Admit: 2018-02-09 | Discharge: 2018-02-09 | Disposition: A | Discharge status: Home / Self Care | Payer: Medicare Other | Attending: Family Medicine | Admitting: Family Medicine

## 2018-02-09 ENCOUNTER — Encounter (HOSPITAL_COMMUNITY): Payer: Self-pay | Admitting: Emergency Medicine

## 2018-02-09 ENCOUNTER — Ambulatory Visit (INDEPENDENT_AMBULATORY_CARE_PROVIDER_SITE_OTHER): Payer: Medicare Other

## 2018-02-09 DIAGNOSIS — J22 Unspecified acute lower respiratory infection: Secondary | ICD-10-CM | POA: Diagnosis not present

## 2018-02-09 DIAGNOSIS — R05 Cough: Secondary | ICD-10-CM | POA: Diagnosis not present

## 2018-02-09 MED ORDER — SULFAMETHOXAZOLE-TRIMETHOPRIM 800-160 MG PO TABS: 1.0000 | ORAL_TABLET | Freq: Two times a day (BID) | ORAL | 0 refills | Status: AC

## 2018-02-09 NOTE — ED Triage Notes (Signed)
Pt c/o cough, HA, started on zithromax by her PCP.

## 2018-02-09 NOTE — ED Provider Notes (Signed)
[facility]    CSN: [id number] Arrival date & time: 02/09/18  1100     History   Chief Complaint Chief Complaint  Patient presents with  . Cough    appt 11:00am    HPI Karen Allison is a 72 y.o. female.   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  She has had coughing and chest congestion, some sore throat, fever, body aches, severe fatigue for a few days.  She saw her PCP on the 19th.  She was started on a Z-Pak.  She is feeling worse.  This morning she was coughing up bloody sputum.  Last night she had chills all night long.  She is here for evaluation for possible pneumonia.  She had pneumonia twice in the past.  She has had her pneumonia shots. She feels that she is immune compromised because of her ongoing treatment with Herceptin (trastuzumab) for metastatic breast cancer.  She has been on it for 14 years she states. 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   Past Medical History:  Diagnosis Date  . Arthritis   . Blood dyscrasia    bleeds and bruises easily  . Breast cancer ([facility])   . Gait disorder 07/05/2016  . Hepatitis     Hep A years ago  . Hypertension   . Hypothyroidism    Graves disease, age 58 yo  . Nose trouble    right nostril will hemorrhage at times  . Pneumonia     Patient Active Problem List   Diagnosis Date Noted  . Other forms of scoliosis, thoracolumbar region 11/03/2016    Class: Chronic  . Gait disorder 07/05/2016  . HCAP (healthcare-associated pneumonia) 05/09/2014  . Acute respiratory failure with hypoxia ([facility]) 05/09/2014  . Pneumonia 05/09/2014  . Hypertension   . Hypothyroidism   . Breast cancer ([place])   . Cervical stenosis (uterine cervix) 05/07/2014  . Spinal stenosis in cervical region 05/06/2014    Class: Chronic  . Cervical spondylosis without myelopathy 05/06/2014  . HNP (herniated nucleus  pulposus), cervical 05/06/2014  . Adult hypothyroidism 05/03/2013  . 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 Primary malignant neoplasm of breast ([facility]) 10/24/2002    Past Surgical History:  Procedure Laterality Date  . ABDOMINAL HYSTERECTOMY     Age 75  . ANTERIOR CERVICAL DECOMP/DISCECTOMY FUSION N/A 05/06/2014   Procedure: ANTERIOR CERVICAL DISCECTOMY FUSION C3-4 with transgraft, local bone graft, plate and screws;  Surgeon: Jessy Oto, MD;  Location: [facility];  Service: Orthopedics;  Laterality: N/A;  . APPENDECTOMY    . BREAST LUMPECTOMY Left   . COLONOSCOPY    . EYE SURGERY Bilateral    cataracts  . LUMBAR SPINE SURGERY  2011   Removal of hematoma  . ORIF HUMERUS FRACTURE Left 2013  . THORACIC SPINE SURGERY  2011   Bone cages  . THYROIDECTOMY    . TONSILLECTOMY      OB History   None      Home Medications    Prior to Admission medications   Medication Sig Start Date End Date Taking? Authorizing Provider  atenolol (TENORMIN) 25 MG tablet Take 25 mg by mouth every other day.  06/08/16   [provider]  B Complex Vitamins (B COMPLEX PO) Take by mouth.    [provider]  Calcium Carbonate-Vitamin D (CALTRATE 600+D PO) Take  1 tablet by mouth 2 (two) times daily.    [provider]  celecoxib (CELEBREX) 200 MG capsule Take 200 mg by mouth daily.  12/31/10   [provider]  conjugated estrogens (PREMARIN) vaginal cream Place 0.5 Applicatorfuls vaginally 2 (two) times a week. Rubs on outside. 01/19/10   [provider]  diazepam (VALIUM) 10 MG tablet Take 10 mg by mouth at bedtime.     [provider]  diclofenac (CATAFLAM) 50 MG tablet Take 1 tablet (50 mg total) by mouth 2 (two) times daily. 01/10/18   Jessy Oto, MD  docusate sodium (COLACE) 100 MG capsule Take 200 mg by mouth 2 (two) times daily as needed for mild constipation.     [provider]  esomeprazole (NEXIUM) 20 MG capsule Take 1 capsule  (20 mg total) by mouth daily at 12 noon. 11/23/16   Jessy Oto, MD  gabapentin (NEURONTIN) 600 MG tablet Take 600-1,200 mg by mouth 4 (four) times daily - after meals and at bedtime. 1 tablet (600 mg) 3 times a day and 2 tablets(1200mg ) at bedtime    [provider]  HYDROcodone-acetaminophen (NORCO) 7.5-325 MG per tablet Take 2 tablets by mouth 2 (two) times daily as needed for moderate pain.     [provider]  levothyroxine (SYNTHROID, LEVOTHROID) 50 MCG tablet Take 50 mcg by mouth daily.    [provider]  methocarbamol (ROBAXIN) 500 MG tablet Take 500 mg by mouth 4 (four) times daily.    [provider]  Multiple Vitamin (MULTIVITAMIN WITH MINERALS) TABS Take 1 tablet by mouth daily.    [provider]  omeprazole (PRILOSEC) 20 MG capsule Take 1 capsule (20 mg total) by mouth 2 (two) times daily before a meal. 01/10/18   Jessy Oto, MD  promethazine (PHENERGAN) 25 MG tablet Take 25 mg by mouth every 6 (six) hours as needed for nausea or vomiting.    [provider]  sulfamethoxazole-trimethoprim (BACTRIM DS,SEPTRA DS) 800-160 MG tablet Take 1 tablet by mouth 2 (two) times daily for 10 days. 02/09/18 02/19/18  Raylene Everts, MD  trastuzumab (HERCEPTIN) 440 MG injection 440 mg by Intravenous (Continuous Infusion) route every 21 ( twenty-one) days. infusion every 3 weeks 03/19/09   [provider]    Family History Family History  Problem Relation Age of Onset  . Congestive Heart Failure Mother   . Heart disease Father   . Tongue cancer Grandchild     Social History Social History   Tobacco Use  . Smoking status: Former Smoker    Types: Cigarettes    Last attempt to quit: 04/23/1962    Years since quitting: 55.8  . Smokeless tobacco: Never Used  . Tobacco comment: as teenager  Substance Use Topics  . Alcohol use: Yes    Comment: Social  . Drug use: No     Allergies   Patient has no known  allergies.   Review of Systems Review of Systems  Constitutional: Positive for chills, fatigue and fever.  HENT: Negative for congestion, ear pain and sore throat.   Eyes: Negative for pain and visual disturbance.  Respiratory: Positive for cough. Negative for shortness of breath.        Hemoptysis  Cardiovascular: Negative for chest pain and palpitations.  Gastrointestinal: Negative for abdominal pain, nausea and vomiting.  Genitourinary: Negative for dysuria and hematuria.  Musculoskeletal: Negative for arthralgias and back pain.  Skin: Negative for color change and rash.  Neurological: Negative for seizures and syncope.  All other systems reviewed and are negative.    Physical Exam Triage Vital Signs ED Triage Vitals [02/09/18 1111]  Enc Vitals Group     BP (!) 147/80     Pulse Rate 90     Resp 18     Temp 99.2 F (37.3 C)     Temp Source Oral     SpO2 95 %     Weight      Height      Head Circumference      Peak Flow      Pain Score      Pain Loc      Pain Edu?      Excl. in [place]?    No data found.  Updated Vital Signs BP (!) 147/80 (BP Location: Right Arm)   Pulse 90   Temp 99.2 F (37.3 C) (Oral)   Resp 18   SpO2 95%       Physical Exam  Constitutional: She appears well-developed and well-nourished. She appears distressed.  Appears moderately ill.  Tired  HENT:  Head: Normocephalic and atraumatic.  Right Ear: External ear normal.  Left Ear: External ear normal.  Nose: Nose normal.  Mouth/Throat: Oropharynx is clear and moist.  Eyes: Pupils are equal, round, and reactive to light. Conjunctivae are normal.  Neck: Normal range of motion.  Cardiovascular: Normal rate, regular rhythm and normal heart sounds.  Pulmonary/Chest: Effort normal. No respiratory distress. She has rales.  Central rhonchi.  Faint rales both bases.  Abdominal: Soft. Bowel sounds are normal. She exhibits no distension.  Musculoskeletal: Normal range of motion. She exhibits no  edema.  Lymphadenopathy:    She has no cervical adenopathy.  Neurological: She is alert.  Skin: Skin is warm and dry.  Psychiatric: She has a normal mood and affect. Her behavior is normal.     UC Treatments / Results  Labs (all labs ordered are listed, but only abnormal results are displayed) Labs Reviewed - No data to display  EKG None  Radiology Dg Chest 2 View  Result Date: 02/09/2018 CLINICAL DATA:  Cough for 1 week. EXAM: CHEST - 2 VIEW COMPARISON:  None. FINDINGS: The lungs are hyperinflated likely secondary to COPD. There is no focal parenchymal opacity. There is no pleural effusion or pneumothorax. The heart and mediastinal contours are unremarkable. The osseous structures are unremarkable. IMPRESSION: No active cardiopulmonary disease. Electronically Signed   By: Kathreen Devoid   On: 02/09/2018 12:14    Procedures Procedures (including critical care time)  Medications Ordered in UC Medications - No data to display  Initial Impression / Assessment and Plan / UC Course  I have reviewed the triage vital signs and the nursing notes.  Pertinent labs & imaging results that were available during my care of the patient were reviewed by me and considered in my medical decision making (see chart for details).     Gust normal chest x-ray.  Discussed nonresponse to Z-Pak.  Patient has a history of staph pneumonia in the past.  The Z-Pak does not cover staph well.  She is clinically worsening even on a Z-Pak.  This could still be a viral infection.  I feel it is best to cover her with a second antibiotic and I am going to add Septra DS.  She is going to rest.  Push fluids.  Call her primary care doctor on Monday.  Go to the ER if worse over the weekend.  Final Clinical Impressions(s) / UC Diagnoses   Final diagnoses:  Lower respiratory infection     Discharge Instructions     Push fluids Take the septra 2 x a day Take 2 doses today Follow up if you fail  to  improve    ED Prescriptions    Medication Sig Dispense Auth. Provider   sulfamethoxazole-trimethoprim (BACTRIM DS,SEPTRA DS) 800-160 MG tablet Take 1 tablet by mouth 2 (two) times daily for 10 days. 20 tablet Raylene Everts, MD     Controlled Substance Prescriptions [place] Controlled Substance Registry consulted? Not Applicable   Raylene Everts, MD 02/09/18 2138

## 2018-02-09 NOTE — Discharge Instructions (Addendum)
Push fluids Take the septra 2 x a day Take 2 doses today Follow up if you fail  to improve

## 2018-02-09 NOTE — ED Notes (Signed)
Bed: UC01 Expected date:  Expected time:  Means of arrival:  Comments:

## 2018-02-19 DIAGNOSIS — Z5111 Encounter for antineoplastic chemotherapy: Secondary | ICD-10-CM | POA: Diagnosis not present

## 2018-02-19 DIAGNOSIS — C787 Secondary malignant neoplasm of liver and intrahepatic bile duct: Secondary | ICD-10-CM | POA: Diagnosis not present

## 2018-02-19 DIAGNOSIS — Z515 Encounter for palliative care: Secondary | ICD-10-CM | POA: Diagnosis not present

## 2018-02-19 DIAGNOSIS — C7800 Secondary malignant neoplasm of unspecified lung: Secondary | ICD-10-CM | POA: Diagnosis not present

## 2018-02-19 DIAGNOSIS — C50912 Malignant neoplasm of unspecified site of left female breast: Secondary | ICD-10-CM | POA: Diagnosis not present

## 2018-02-28 DIAGNOSIS — C7800 Secondary malignant neoplasm of unspecified lung: Secondary | ICD-10-CM | POA: Diagnosis not present

## 2018-02-28 DIAGNOSIS — C50912 Malignant neoplasm of unspecified site of left female breast: Secondary | ICD-10-CM | POA: Diagnosis not present

## 2018-02-28 DIAGNOSIS — Z79899 Other long term (current) drug therapy: Secondary | ICD-10-CM | POA: Diagnosis not present

## 2018-02-28 DIAGNOSIS — Z8701 Personal history of pneumonia (recurrent): Secondary | ICD-10-CM | POA: Diagnosis not present

## 2018-02-28 DIAGNOSIS — C787 Secondary malignant neoplasm of liver and intrahepatic bile duct: Secondary | ICD-10-CM | POA: Diagnosis not present

## 2018-03-02 DIAGNOSIS — I517 Cardiomegaly: Secondary | ICD-10-CM | POA: Diagnosis not present

## 2018-03-02 DIAGNOSIS — C50912 Malignant neoplasm of unspecified site of left female breast: Secondary | ICD-10-CM | POA: Diagnosis not present

## 2018-03-02 DIAGNOSIS — T451X1D Poisoning by antineoplastic and immunosuppressive drugs, accidental (unintentional), subsequent encounter: Secondary | ICD-10-CM | POA: Diagnosis not present

## 2018-03-09 DIAGNOSIS — C7800 Secondary malignant neoplasm of unspecified lung: Secondary | ICD-10-CM | POA: Diagnosis not present

## 2018-03-09 DIAGNOSIS — C787 Secondary malignant neoplasm of liver and intrahepatic bile duct: Secondary | ICD-10-CM | POA: Diagnosis not present

## 2018-03-09 DIAGNOSIS — Z5111 Encounter for antineoplastic chemotherapy: Secondary | ICD-10-CM | POA: Diagnosis not present

## 2018-03-09 DIAGNOSIS — C50912 Malignant neoplasm of unspecified site of left female breast: Secondary | ICD-10-CM | POA: Diagnosis not present

## 2018-03-19 DIAGNOSIS — H018 Other specified inflammations of eyelid: Secondary | ICD-10-CM | POA: Diagnosis not present

## 2018-03-19 DIAGNOSIS — H04123 Dry eye syndrome of bilateral lacrimal glands: Secondary | ICD-10-CM | POA: Diagnosis not present

## 2018-03-21 ENCOUNTER — Other Ambulatory Visit: Payer: Self-pay

## 2018-03-28 DIAGNOSIS — Z1389 Encounter for screening for other disorder: Secondary | ICD-10-CM | POA: Diagnosis not present

## 2018-03-28 DIAGNOSIS — Z0001 Encounter for general adult medical examination with abnormal findings: Secondary | ICD-10-CM | POA: Diagnosis not present

## 2018-03-28 DIAGNOSIS — Z1331 Encounter for screening for depression: Secondary | ICD-10-CM | POA: Diagnosis not present

## 2018-03-30 DIAGNOSIS — C7800 Secondary malignant neoplasm of unspecified lung: Secondary | ICD-10-CM | POA: Diagnosis not present

## 2018-03-30 DIAGNOSIS — C787 Secondary malignant neoplasm of liver and intrahepatic bile duct: Secondary | ICD-10-CM | POA: Diagnosis not present

## 2018-03-30 DIAGNOSIS — Z5111 Encounter for antineoplastic chemotherapy: Secondary | ICD-10-CM | POA: Diagnosis not present

## 2018-03-30 DIAGNOSIS — C50912 Malignant neoplasm of unspecified site of left female breast: Secondary | ICD-10-CM | POA: Diagnosis not present

## 2018-04-05 DIAGNOSIS — J40 Bronchitis, not specified as acute or chronic: Secondary | ICD-10-CM | POA: Diagnosis not present

## 2018-04-05 DIAGNOSIS — Z6824 Body mass index (BMI) 24.0-24.9, adult: Secondary | ICD-10-CM | POA: Diagnosis not present

## 2018-04-12 ENCOUNTER — Ambulatory Visit (INDEPENDENT_AMBULATORY_CARE_PROVIDER_SITE_OTHER): Payer: Medicare Other | Admitting: Specialist

## 2018-04-12 DIAGNOSIS — R062 Wheezing: Secondary | ICD-10-CM | POA: Diagnosis not present

## 2018-04-12 DIAGNOSIS — Z6824 Body mass index (BMI) 24.0-24.9, adult: Secondary | ICD-10-CM | POA: Diagnosis not present

## 2018-04-12 DIAGNOSIS — R0989 Other specified symptoms and signs involving the circulatory and respiratory systems: Secondary | ICD-10-CM | POA: Diagnosis not present

## 2018-04-20 DIAGNOSIS — C7800 Secondary malignant neoplasm of unspecified lung: Secondary | ICD-10-CM | POA: Diagnosis not present

## 2018-04-20 DIAGNOSIS — Z5111 Encounter for antineoplastic chemotherapy: Secondary | ICD-10-CM | POA: Diagnosis not present

## 2018-04-20 DIAGNOSIS — C50912 Malignant neoplasm of unspecified site of left female breast: Secondary | ICD-10-CM | POA: Diagnosis not present

## 2018-04-20 DIAGNOSIS — C787 Secondary malignant neoplasm of liver and intrahepatic bile duct: Secondary | ICD-10-CM | POA: Diagnosis not present

## 2018-05-02 ENCOUNTER — Ambulatory Visit (INDEPENDENT_AMBULATORY_CARE_PROVIDER_SITE_OTHER): Payer: Medicare Other | Admitting: Specialist

## 2018-05-02 ENCOUNTER — Encounter (INDEPENDENT_AMBULATORY_CARE_PROVIDER_SITE_OTHER): Payer: Self-pay | Admitting: Specialist

## 2018-05-02 VITALS — BP 153/71 | HR 65 | Ht 61.0 in | Wt 134.0 lb

## 2018-05-02 DIAGNOSIS — M4327 Fusion of spine, lumbosacral region: Secondary | ICD-10-CM | POA: Diagnosis not present

## 2018-05-02 DIAGNOSIS — R29898 Other symptoms and signs involving the musculoskeletal system: Secondary | ICD-10-CM | POA: Diagnosis not present

## 2018-05-02 DIAGNOSIS — M542 Cervicalgia: Secondary | ICD-10-CM

## 2018-05-02 NOTE — Progress Notes (Signed)
Office Visit Note   Patient: Karen Allison           Date of Birth: 07-26-1946           MRN: [id number] Visit Date: 05/02/2018              Requested by: [name], MD [address] [place], [place] [address] PCP: [name], MD   Assessment & Plan: Visit Diagnoses:  1. Fusion of lumbosacral spine   2. Bilateral leg weakness   3. Cervicalgia     Plan: Avoid overhead lifting and overhead use of the arms. Do not lift greater than 5 lbs. Adjust head rest in vehicle to prevent hyperextension if rear ended. Take extra precautions to avoid falling. We will schedule a MRI to assess for any sign of cervical stenosis post cervical fusion..  Follow-Up Instructions: Return in about 3 weeks (around 05/23/2018).   Orders:  Orders Placed This Encounter  Procedures  . MR Cervical Spine w/o contrast   No orders of the defined types were placed in this encounter.     Procedures: No procedures performed   Clinical Data: No additional findings.   Subjective: Chief Complaint  Patient presents with  . Lower Back - Follow-up    72 year old female with history of thoracolumbar fusion with rods and screws. She experiences ongoiing leg numbness and weakness with decreased balance and coordination. She has a baseline incontinence due to post operative. She is concern about balance and coordination. She is received receptin chemotherapy for metastatic breast ca.   Review of Systems   Objective: Vital Signs: BP (!) 153/71 (BP Location: Left Arm, Patient Position: Sitting)   Pulse 65   Ht 5\' 1"  (1.549 m)   Wt 134 lb (60.8 kg)   BMI 25.32 kg/m   Physical Exam  Ortho Exam  Specialty Comments:  No specialty comments available.  Imaging: No results found.   PMFS History: Patient Active Problem List   Diagnosis Date Noted  . Other forms of scoliosis, thoracolumbar region 11/03/2016    Priority: High    Class: Chronic  . Spinal stenosis in cervical region  05/06/2014    Priority: High    Class: Chronic  . Cervical spondylosis without myelopathy 05/06/2014    Priority: High  . Gait disorder 07/05/2016  . HCAP (healthcare-associated pneumonia) 05/09/2014  . Acute respiratory failure with hypoxia ([facility]) 05/09/2014  . Pneumonia 05/09/2014  . Hypertension   . Hypothyroidism   . Breast cancer ([facility])   . Cervical stenosis (uterine cervix) 05/07/2014  . HNP (herniated nucleus pulposus), cervical 05/06/2014  . Adult hypothyroidism 05/03/2013  . 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 Primary malignant neoplasm of breast ([facility]) 10/24/2002   Past Medical History:  Diagnosis Date  . Arthritis   . Blood dyscrasia    bleeds and bruises easily  . Breast cancer ([facility])   . Gait disorder 07/05/2016  . Hepatitis     Hep A years ago  . Hypertension   . Hypothyroidism    Graves disease, age 53 yo  . Nose trouble    right nostril will hemorrhage at times  . Pneumonia     Family History  Problem Relation Age of Onset  . Congestive Heart Failure Mother   . Heart disease Father   . Tongue cancer Grandchild     Past Surgical History:  Procedure Laterality Date  . ABDOMINAL HYSTERECTOMY     Age 70  . ANTERIOR CERVICAL DECOMP/DISCECTOMY FUSION  N/A 05/06/2014   Procedure: ANTERIOR CERVICAL DISCECTOMY FUSION C3-4 with transgraft, local bone graft, plate and screws;  Surgeon: Jessy Oto, MD;  Location: [facility];  Service: Orthopedics;  Laterality: N/A;  . APPENDECTOMY    . BREAST LUMPECTOMY Left   . COLONOSCOPY    . EYE SURGERY Bilateral    cataracts  . LUMBAR SPINE SURGERY  2011   Removal of hematoma  . ORIF HUMERUS FRACTURE Left 2013  . THORACIC SPINE SURGERY  2011   Bone cages  . THYROIDECTOMY    . TONSILLECTOMY     Social History   Occupational History  . Occupation: Retired  Tobacco Use  . Smoking status: Former Smoker    Types: Cigarettes    Last attempt to quit: 04/23/1962    Years since quitting: 56.0  . Smokeless tobacco:  Never Used  . Tobacco comment: as teenager  Substance and Sexual Activity  . Alcohol use: Yes    Comment: Social  . Drug use: No  . Sexual activity: Not on file

## 2018-05-02 NOTE — Patient Instructions (Addendum)
Avoid overhead lifting and overhead use of the arms. Do not lift greater than 5 lbs. Adjust head rest in vehicle to prevent hyperextension if rear ended. Take extra precautions to avoid falling. We will schedule a MRI to assess for any sign of cervical stenosis post cervical fusion.

## 2018-05-10 ENCOUNTER — Ambulatory Visit
Admission: RE | Admit: 2018-05-10 | Discharge: 2018-05-10 | Disposition: A | Discharge status: Home / Self Care | Payer: Medicare Other | Source: Ambulatory Visit | Attending: Specialist | Admitting: Specialist

## 2018-05-10 DIAGNOSIS — M542 Cervicalgia: Secondary | ICD-10-CM

## 2018-05-10 DIAGNOSIS — M4802 Spinal stenosis, cervical region: Secondary | ICD-10-CM | POA: Diagnosis not present

## 2018-05-11 DIAGNOSIS — C7800 Secondary malignant neoplasm of unspecified lung: Secondary | ICD-10-CM | POA: Diagnosis not present

## 2018-05-11 DIAGNOSIS — C787 Secondary malignant neoplasm of liver and intrahepatic bile duct: Secondary | ICD-10-CM | POA: Diagnosis not present

## 2018-05-11 DIAGNOSIS — C50912 Malignant neoplasm of unspecified site of left female breast: Secondary | ICD-10-CM | POA: Diagnosis not present

## 2018-05-11 DIAGNOSIS — Z5111 Encounter for antineoplastic chemotherapy: Secondary | ICD-10-CM | POA: Diagnosis not present

## 2018-05-28 ENCOUNTER — Other Ambulatory Visit (INDEPENDENT_AMBULATORY_CARE_PROVIDER_SITE_OTHER): Payer: Self-pay | Admitting: Radiology

## 2018-05-28 MED ORDER — OMEPRAZOLE 20 MG PO CPDR: 20.0000 mg | DELAYED_RELEASE_CAPSULE | Freq: Two times a day (BID) | ORAL | 3 refills | Status: DC

## 2018-05-28 NOTE — Telephone Encounter (Signed)
Pharmacy sent paper request

## 2018-05-29 ENCOUNTER — Encounter (INDEPENDENT_AMBULATORY_CARE_PROVIDER_SITE_OTHER): Payer: Self-pay | Admitting: Specialist

## 2018-05-30 DIAGNOSIS — C7800 Secondary malignant neoplasm of unspecified lung: Secondary | ICD-10-CM | POA: Diagnosis not present

## 2018-05-30 DIAGNOSIS — C787 Secondary malignant neoplasm of liver and intrahepatic bile duct: Secondary | ICD-10-CM | POA: Diagnosis not present

## 2018-05-30 DIAGNOSIS — C50912 Malignant neoplasm of unspecified site of left female breast: Secondary | ICD-10-CM | POA: Diagnosis not present

## 2018-05-31 ENCOUNTER — Telehealth (INDEPENDENT_AMBULATORY_CARE_PROVIDER_SITE_OTHER): Payer: Self-pay | Admitting: Specialist

## 2018-05-31 DIAGNOSIS — Z23 Encounter for immunization: Secondary | ICD-10-CM | POA: Diagnosis not present

## 2018-05-31 NOTE — Telephone Encounter (Signed)
Called patient concerning her MRI review. Patient asked if she can be put on the wait list for an earlier appointment. Patient also asked if Dr. Louanne Skye can call her with the MRI results. The number to contact patient is [phone]

## 2018-06-01 NOTE — Telephone Encounter (Signed)
I put her on the cancellation list 

## 2018-06-20 DIAGNOSIS — D72819 Decreased white blood cell count, unspecified: Secondary | ICD-10-CM | POA: Diagnosis not present

## 2018-06-20 DIAGNOSIS — M8589 Other specified disorders of bone density and structure, multiple sites: Secondary | ICD-10-CM | POA: Diagnosis not present

## 2018-06-20 DIAGNOSIS — D649 Anemia, unspecified: Secondary | ICD-10-CM | POA: Diagnosis not present

## 2018-06-20 DIAGNOSIS — Z853 Personal history of malignant neoplasm of breast: Secondary | ICD-10-CM | POA: Diagnosis not present

## 2018-07-09 ENCOUNTER — Other Ambulatory Visit: Payer: Self-pay

## 2018-07-11 ENCOUNTER — Encounter (INDEPENDENT_AMBULATORY_CARE_PROVIDER_SITE_OTHER): Payer: Self-pay | Admitting: Specialist

## 2018-07-11 ENCOUNTER — Ambulatory Visit (INDEPENDENT_AMBULATORY_CARE_PROVIDER_SITE_OTHER): Payer: Medicare Other | Admitting: Specialist

## 2018-07-11 VITALS — BP 165/65 | HR 58 | Ht 61.0 in | Wt 134.0 lb

## 2018-07-11 DIAGNOSIS — M4722 Other spondylosis with radiculopathy, cervical region: Secondary | ICD-10-CM

## 2018-07-11 NOTE — Patient Instructions (Addendum)
Avoid overhead lifting and overhead use of the arms. Do not lift greater than 5 lbs. Adjust head rest in vehicle to prevent hyperextension if rear ended. Take extra precautions to avoid falling.  If the pain worsens call us and we will try therapy.

## 2018-07-11 NOTE — Progress Notes (Signed)
Office Visit Note   Patient: Karen Allison           Date of Birth: Oct 23, 1945           MRN: [id number] Visit Date: 07/11/2018              Requested by: [name], MD [address]. [place], [place] [address] PCP: [name], MD   Assessment & Plan: Visit Diagnoses:  1. Other spondylosis with radiculopathy, cervical region     Plan: Avoid overhead lifting and overhead use of the arms. Do not lift greater than 5 lbs. Adjust head rest in vehicle to prevent hyperextension if rear ended. Take extra precautions to avoid falling.   Follow-Up Instructions: No follow-ups on file.   Orders:  No orders of the defined types were placed in this encounter.  No orders of the defined types were placed in this encounter.     Procedures: No procedures performed   Clinical Data: No additional findings.   Subjective: Chief Complaint  Patient presents with  . Neck - Follow-up    72 year old female with persistent neck pain with some left arm radiation, numbness into the left heel that is intermittant. She is having difficulty with falls, her coordination and balance is still a concern and she is going to PT and OT.    Review of Systems  Constitutional: Negative.   HENT: Negative.   Eyes: Negative.   Respiratory: Negative.   Cardiovascular: Negative.   Gastrointestinal: Negative.   Endocrine: Negative.   Genitourinary: Negative.   Musculoskeletal: Negative.   Skin: Negative.   Allergic/Immunologic: Negative.   Neurological: Negative.   Hematological: Negative.   Psychiatric/Behavioral: Negative.      Objective: Vital Signs: BP (!) 165/65 (BP Location: Left Arm, Patient Position: Sitting)   Pulse (!) 58   Ht 5\' 1"  (1.549 m)   Wt 134 lb (60.8 kg)   BMI 25.32 kg/m   Physical Exam  Constitutional: She is oriented to person, place, and time. She appears well-developed and well-nourished.  HENT:  Head: Normocephalic and atraumatic.  Eyes: Pupils are  equal, round, and reactive to light. EOM are normal.  Neck: Normal range of motion. Neck supple.  Pulmonary/Chest: Effort normal and breath sounds normal.  Abdominal: Soft. Bowel sounds are normal.  Neurological: She is alert and oriented to person, place, and time.  Skin: Skin is warm and dry.  Psychiatric: She has a normal mood and affect. Her behavior is normal. Judgment and thought content normal.    Back Exam   Tenderness  The patient is experiencing tenderness in the lumbar.  Range of Motion  Extension: abnormal  Flexion: abnormal  Lateral bend right: abnormal  Lateral bend left: abnormal  Rotation right: abnormal  Rotation left: abnormal   Muscle Strength  Right Quadriceps:  5/5  Left Quadriceps:  5/5  Right Hamstrings:  5/5  Left Hamstrings:  5/5   Tests  Straight leg raise right: negative Straight leg raise left: negative  Reflexes  Patellar:  Hyporeflexic abnormal Achilles:  Hyporeflexic abnormal Biceps: normal Babinski's sign: normal   Other  Toe walk: normal Heel walk: normal Sensation: normal Gait: normal  Erythema: no back redness Scars: absent      Specialty Comments:  No specialty comments available.  Imaging: No results found.   PMFS History: Patient Active Problem List   Diagnosis Date Noted  . Other forms of scoliosis, thoracolumbar region 11/03/2016    Priority: High  Class: Chronic  . Spinal stenosis in cervical region 05/06/2014    Priority: High    Class: Chronic  . Cervical spondylosis without myelopathy 05/06/2014    Priority: High  . Gait disorder 07/05/2016  . HCAP (healthcare-associated pneumonia) 05/09/2014  . Acute respiratory failure with hypoxia ([facility]) 05/09/2014  . Pneumonia 05/09/2014  . Hypertension   . Hypothyroidism   . Breast cancer ([facility])   . Cervical stenosis (uterine cervix) 05/07/2014  . HNP (herniated nucleus pulposus), cervical 05/06/2014  . Adult hypothyroidism 05/03/2013  . 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 Primary malignant neoplasm of breast ([facility]) 10/24/2002   Past Medical History:  Diagnosis Date  . Arthritis   . Blood dyscrasia    bleeds and bruises easily  . Breast cancer ([facility])   . Gait disorder 07/05/2016  . Hepatitis     Hep A years ago  . Hypertension   . Hypothyroidism    Graves disease, age 17 yo  . Nose trouble    right nostril will hemorrhage at times  . Pneumonia     Family History  Problem Relation Age of Onset  . Congestive Heart Failure Mother   . Heart disease Father   . Tongue cancer Grandchild     Past Surgical History:  Procedure Laterality Date  . ABDOMINAL HYSTERECTOMY     Age 48  . ANTERIOR CERVICAL DECOMP/DISCECTOMY FUSION N/A 05/06/2014   Procedure: ANTERIOR CERVICAL DISCECTOMY FUSION C3-4 with transgraft, local bone graft, plate and screws;  Surgeon: Jessy Oto, MD;  Location: [facility];  Service: Orthopedics;  Laterality: N/A;  . APPENDECTOMY    . BREAST LUMPECTOMY Left   . COLONOSCOPY    . EYE SURGERY Bilateral    cataracts  . LUMBAR SPINE SURGERY  2011   Removal of hematoma  . ORIF HUMERUS FRACTURE Left 2013  . THORACIC SPINE SURGERY  2011   Bone cages  . THYROIDECTOMY    . TONSILLECTOMY     Social History   Occupational History  . Occupation: Retired  Tobacco Use  . Smoking status: Former Smoker    Types: Cigarettes    Last attempt to quit: 04/23/1962    Years since quitting: 56.2  . Smokeless tobacco: Never Used  . Tobacco comment: as teenager  Substance and Sexual Activity  . Alcohol use: Yes    Comment: Social  . Drug use: No  . Sexual activity: Not on file

## 2018-07-12 DIAGNOSIS — L570 Actinic keratosis: Secondary | ICD-10-CM | POA: Diagnosis not present

## 2018-07-12 DIAGNOSIS — L659 Nonscarring hair loss, unspecified: Secondary | ICD-10-CM | POA: Diagnosis not present

## 2018-07-12 DIAGNOSIS — L719 Rosacea, unspecified: Secondary | ICD-10-CM | POA: Diagnosis not present

## 2018-07-12 DIAGNOSIS — E039 Hypothyroidism, unspecified: Secondary | ICD-10-CM | POA: Diagnosis not present

## 2018-07-12 DIAGNOSIS — M5137 Other intervertebral disc degeneration, lumbosacral region: Secondary | ICD-10-CM | POA: Diagnosis not present

## 2018-07-12 DIAGNOSIS — L821 Other seborrheic keratosis: Secondary | ICD-10-CM | POA: Diagnosis not present

## 2018-07-12 DIAGNOSIS — Z6825 Body mass index (BMI) 25.0-25.9, adult: Secondary | ICD-10-CM | POA: Diagnosis not present

## 2018-07-12 DIAGNOSIS — E663 Overweight: Secondary | ICD-10-CM | POA: Diagnosis not present

## 2018-07-12 DIAGNOSIS — F419 Anxiety disorder, unspecified: Secondary | ICD-10-CM | POA: Diagnosis not present

## 2018-07-12 DIAGNOSIS — I10 Essential (primary) hypertension: Secondary | ICD-10-CM | POA: Diagnosis not present

## 2018-07-13 DIAGNOSIS — C7800 Secondary malignant neoplasm of unspecified lung: Secondary | ICD-10-CM | POA: Diagnosis not present

## 2018-07-13 DIAGNOSIS — C787 Secondary malignant neoplasm of liver and intrahepatic bile duct: Secondary | ICD-10-CM | POA: Diagnosis not present

## 2018-07-13 DIAGNOSIS — C50912 Malignant neoplasm of unspecified site of left female breast: Secondary | ICD-10-CM | POA: Diagnosis not present

## 2018-07-13 DIAGNOSIS — E039 Hypothyroidism, unspecified: Secondary | ICD-10-CM | POA: Diagnosis not present

## 2018-07-13 DIAGNOSIS — Z5111 Encounter for antineoplastic chemotherapy: Secondary | ICD-10-CM | POA: Diagnosis not present

## 2018-08-03 DIAGNOSIS — C787 Secondary malignant neoplasm of liver and intrahepatic bile duct: Secondary | ICD-10-CM | POA: Diagnosis not present

## 2018-08-03 DIAGNOSIS — C50912 Malignant neoplasm of unspecified site of left female breast: Secondary | ICD-10-CM | POA: Diagnosis not present

## 2018-08-03 DIAGNOSIS — C7800 Secondary malignant neoplasm of unspecified lung: Secondary | ICD-10-CM | POA: Diagnosis not present

## 2018-08-03 DIAGNOSIS — Z5111 Encounter for antineoplastic chemotherapy: Secondary | ICD-10-CM | POA: Diagnosis not present

## 2018-08-24 DIAGNOSIS — C7800 Secondary malignant neoplasm of unspecified lung: Secondary | ICD-10-CM | POA: Diagnosis not present

## 2018-08-24 DIAGNOSIS — Z5111 Encounter for antineoplastic chemotherapy: Secondary | ICD-10-CM | POA: Diagnosis not present

## 2018-08-24 DIAGNOSIS — C50912 Malignant neoplasm of unspecified site of left female breast: Secondary | ICD-10-CM | POA: Diagnosis not present

## 2018-08-24 DIAGNOSIS — C787 Secondary malignant neoplasm of liver and intrahepatic bile duct: Secondary | ICD-10-CM | POA: Diagnosis not present

## 2018-09-05 ENCOUNTER — Ambulatory Visit (INDEPENDENT_AMBULATORY_CARE_PROVIDER_SITE_OTHER): Payer: Medicare Other | Admitting: Family Medicine

## 2018-09-05 ENCOUNTER — Encounter (INDEPENDENT_AMBULATORY_CARE_PROVIDER_SITE_OTHER): Payer: Self-pay | Admitting: Family Medicine

## 2018-09-05 DIAGNOSIS — S300XXA Contusion of lower back and pelvis, initial encounter: Secondary | ICD-10-CM

## 2018-09-05 NOTE — Progress Notes (Signed)
Office Visit Note   Patient: Karen Allison           Date of Birth: 16-Apr-1946           MRN: [id number] Visit Date: 09/05/2018 Requested by: [name], MD [place] [place], [place] [address] PCP: [name], MD  Subjective: Chief Complaint  Patient presents with  . Left Hip - Pain    Has fallen twice, 08/11/18 and 2 weeks later. Both times, hit her left buttock area on something.   . Lower Back - Pain    Pain around coccyx post 2 falls.    HPI: She is here with tailbone pain.  A few days before Christmas she was visiting the grave site of her parents.  She lost her balance and fell hitting her tailbone against the edge of some stone.  Immediate pain but she was able to get up and walk.  Then a few days later she fell at home landing again on the tailbone area.  She has pain medicine and has been taking it, her pain seems to be improving.  She also has osteoporosis and is getting Reclast therapy every 6 months.  Denies any troubles with bowel movements, no urinary incontinence.  No radicular symptoms.  Pain is worst when sitting with direct pressure on her tailbone, better when walking or lying down.              ROS: Otherwise noncontributory  Objective: Vital Signs: There were no vitals taken for this visit.  Physical Exam:  Low back: No tenderness over the lumbar spinous processes.  Mild tenderness near the left sacroiliac joint.  Maximum tenderness along the midportion of the sacrum.  No crepitation or step-off, no palpable angulation.  Straight leg raise is negative, lower extremity strength and reflexes are normal.   Imaging: None today.  Assessment & Plan: 1.  Approximately 3 weeks status post fall with sacral contusion, cannot rule out fracture but clinically improving. -Reassurance, anticipate 6 to 12 weeks healing time.  If she takes a turn for the worse we will order x-rays.  She will try a donut cushion for comfort.   Follow-Up Instructions: No  follow-ups on file.      Procedures: No procedures performed  No notes on file    PMFS History: Patient Active Problem List   Diagnosis Date Noted  . Other forms of scoliosis, thoracolumbar region 11/03/2016    Class: Chronic  . Gait disorder 07/05/2016  . HCAP (healthcare-associated pneumonia) 05/09/2014  . Acute respiratory failure with hypoxia ([facility]) 05/09/2014  . Pneumonia 05/09/2014  . Hypertension   . Hypothyroidism   . Breast cancer ([facility])   . Cervical stenosis (uterine cervix) 05/07/2014  . Spinal stenosis in cervical region 05/06/2014    Class: Chronic  . Cervical spondylosis without myelopathy 05/06/2014  . HNP (herniated nucleus pulposus), cervical 05/06/2014  . Adult hypothyroidism 05/03/2013  . 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 Primary malignant neoplasm of breast ([facility]) 10/24/2002   Past Medical History:  Diagnosis Date  . Arthritis   . Blood dyscrasia    bleeds and bruises easily  . Breast cancer ([facility])   . Gait disorder 07/05/2016  . Hepatitis     Hep A years ago  . Hypertension   . Hypothyroidism    Graves disease, age 59 yo  . Nose trouble    right nostril will hemorrhage at times  . Pneumonia     Family History  Problem Relation  Age of Onset  . Congestive Heart Failure Mother   . Heart disease Father   . Tongue cancer Grandchild     Past Surgical History:  Procedure Laterality Date  . ABDOMINAL HYSTERECTOMY     Age 32  . ANTERIOR CERVICAL DECOMP/DISCECTOMY FUSION N/A 05/06/2014   Procedure: ANTERIOR CERVICAL DISCECTOMY FUSION C3-4 with transgraft, local bone graft, plate and screws;  Surgeon: Jessy Oto, MD;  Location: [facility];  Service: Orthopedics;  Laterality: N/A;  . APPENDECTOMY    . BREAST LUMPECTOMY Left   . COLONOSCOPY    . EYE SURGERY Bilateral    cataracts  . LUMBAR SPINE SURGERY  2011   Removal of hematoma  . ORIF HUMERUS FRACTURE Left 2013  . THORACIC SPINE SURGERY  2011   Bone cages  . THYROIDECTOMY    .  TONSILLECTOMY     Social History   Occupational History  . Occupation: Retired  Tobacco Use  . Smoking status: Former Smoker    Types: Cigarettes    Last attempt to quit: 04/23/1962    Years since quitting: 56.4  . Smokeless tobacco: Never Used  . Tobacco comment: as teenager  Substance and Sexual Activity  . Alcohol use: Yes    Comment: Social  . Drug use: No  . Sexual activity: Not on file

## 2018-09-14 DIAGNOSIS — C7800 Secondary malignant neoplasm of unspecified lung: Secondary | ICD-10-CM | POA: Diagnosis not present

## 2018-09-14 DIAGNOSIS — Z170 Estrogen receptor positive status [ER+]: Secondary | ICD-10-CM | POA: Diagnosis not present

## 2018-09-14 DIAGNOSIS — C50912 Malignant neoplasm of unspecified site of left female breast: Secondary | ICD-10-CM | POA: Diagnosis not present

## 2018-09-14 DIAGNOSIS — M810 Age-related osteoporosis without current pathological fracture: Secondary | ICD-10-CM | POA: Diagnosis not present

## 2018-09-14 DIAGNOSIS — C787 Secondary malignant neoplasm of liver and intrahepatic bile duct: Secondary | ICD-10-CM | POA: Diagnosis not present

## 2018-09-14 DIAGNOSIS — C50919 Malignant neoplasm of unspecified site of unspecified female breast: Secondary | ICD-10-CM | POA: Diagnosis not present

## 2018-09-14 DIAGNOSIS — Z79899 Other long term (current) drug therapy: Secondary | ICD-10-CM | POA: Diagnosis not present

## 2018-10-05 DIAGNOSIS — C787 Secondary malignant neoplasm of liver and intrahepatic bile duct: Secondary | ICD-10-CM | POA: Diagnosis not present

## 2018-10-05 DIAGNOSIS — C7800 Secondary malignant neoplasm of unspecified lung: Secondary | ICD-10-CM | POA: Diagnosis not present

## 2018-10-05 DIAGNOSIS — C50912 Malignant neoplasm of unspecified site of left female breast: Secondary | ICD-10-CM | POA: Diagnosis not present

## 2018-10-05 DIAGNOSIS — Z5111 Encounter for antineoplastic chemotherapy: Secondary | ICD-10-CM | POA: Diagnosis not present

## 2018-10-09 DIAGNOSIS — Z1231 Encounter for screening mammogram for malignant neoplasm of breast: Secondary | ICD-10-CM | POA: Diagnosis not present

## 2018-10-11 ENCOUNTER — Ambulatory Visit (INDEPENDENT_AMBULATORY_CARE_PROVIDER_SITE_OTHER): Payer: Medicare Other | Admitting: Specialist

## 2018-10-11 ENCOUNTER — Encounter (INDEPENDENT_AMBULATORY_CARE_PROVIDER_SITE_OTHER): Payer: Self-pay | Admitting: Specialist

## 2018-10-11 ENCOUNTER — Ambulatory Visit (INDEPENDENT_AMBULATORY_CARE_PROVIDER_SITE_OTHER): Payer: Self-pay

## 2018-10-11 VITALS — BP 123/59 | HR 53 | Ht 61.5 in | Wt 134.0 lb

## 2018-10-11 DIAGNOSIS — R278 Other lack of coordination: Secondary | ICD-10-CM | POA: Diagnosis not present

## 2018-10-11 DIAGNOSIS — R29898 Other symptoms and signs involving the musculoskeletal system: Secondary | ICD-10-CM

## 2018-10-11 DIAGNOSIS — R2689 Other abnormalities of gait and mobility: Secondary | ICD-10-CM | POA: Diagnosis not present

## 2018-10-11 DIAGNOSIS — S300XXA Contusion of lower back and pelvis, initial encounter: Secondary | ICD-10-CM | POA: Diagnosis not present

## 2018-10-11 NOTE — Patient Instructions (Signed)
Plan: Avoid frequent bending and stooping  No lifting greater than 10 lbs. May use ice or moist heat for pain. Weight loss is of benefit. Best medication for lumbar disc disease is arthritis medications like motrin, celebrex and naprosyn. Exercise is important to improve your indurance and does allow people to function better inspite of back pain. Go to PT for balance and coordination exercises Obtain a left foot AFO to support the left ankle and prevent fallls. Fall Prevention and Home Safety Falls cause injuries and can affect all age groups. It is possible to use preventive measures to significantly decrease the likelihood of falls. There are many simple measures which can make your home safer and prevent falls. OUTDOORS  Repair cracks and edges of walkways and driveways.  Remove high doorway thresholds.  Trim shrubbery on the main path into your home.  Have good outside lighting.  Clear walkways of tools, rocks, debris, and clutter.  Check that handrails are not broken and are securely fastened. Both sides of steps should have handrails.  Have leaves, snow, and ice cleared regularly.  Use sand or salt on walkways during winter months.  In the garage, clean up grease or oil spills. BATHROOM  Install night lights.  Install grab bars by the toilet and in the tub and shower.  Use non-skid mats or decals in the tub or shower.  Place a plastic non-slip stool in the shower to sit on, if needed.  Keep floors dry and clean up all water on the floor immediately.  Remove soap buildup in the tub or shower on a regular basis.  Secure bath mats with non-slip, double-sided rug tape.  Remove throw rugs and tripping hazards from the floors. BEDROOMS  Install night lights.  Make sure a bedside light is easy to reach.  Do not use oversized bedding.  Keep a telephone by your bedside.  Have a firm chair with side arms to use for getting dressed.  Remove throw rugs and  tripping hazards from the floor. KITCHEN  Keep handles on pots and pans turned toward the center of the stove. Use back burners when possible.  Clean up spills quickly and allow time for drying.  Avoid walking on wet floors.  Avoid hot utensils and knives.  Position shelves so they are not too high or low.  Place commonly used objects within easy reach.  If necessary, use a sturdy step stool with a grab bar when reaching.  Keep electrical cables out of the way.  Do not use floor polish or wax that makes floors slippery. If you must use wax, use non-skid floor wax.  Remove throw rugs and tripping hazards from the floor. STAIRWAYS  Never leave objects on stairs.  Place handrails on both sides of stairways and use them. Fix any loose handrails. Make sure handrails on both sides of the stairways are as long as the stairs.  Check carpeting to make sure it is firmly attached along stairs. Make repairs to worn or loose carpet promptly.  Avoid placing throw rugs at the top or bottom of stairways, or properly secure the rug with carpet tape to prevent slippage. Get rid of throw rugs, if possible.  Have an electrician put in a light switch at the top and bottom of the stairs. OTHER FALL PREVENTION TIPS  Wear low-heel or rubber-soled shoes that are supportive and fit well. Wear closed toe shoes.  When using a stepladder, make sure it is fully opened and both spreaders are firmly  locked. Do not climb a closed stepladder.  Add color or contrast paint or tape to grab bars and handrails in your home. Place contrasting color strips on first and last steps.  Learn and use mobility aids as needed. Install an electrical emergency response system.  Turn on lights to avoid dark areas. Replace light bulbs that burn out immediately. Get light switches that glow.  Arrange furniture to create clear pathways. Keep furniture in the same place.  Firmly attach carpet with non-skid or double-sided  tape.  Eliminate uneven floor surfaces.  Select a carpet pattern that does not visually hide the edge of steps.  Be aware of all pets. OTHER HOME SAFETY TIPS  Set the water temperature for 120 F (48.8 C).  Keep emergency numbers on or near the telephone.  Keep smoke detectors on every level of the home and near sleeping areas. Document Released: 07/29/2002 Document Revised: 02/07/2012 Document Reviewed: 10/28/2011 [facility] Patient Information 2014 [place].

## 2018-10-11 NOTE — Progress Notes (Signed)
Office Visit Note   Patient: Karen Allison           Date of Birth: Oct 18, 1945           MRN: [id number] Visit Date: 10/11/2018              Requested by: [name], MD [address] [place], [place] [address] PCP: [name], MD   Assessment & Plan: Visit Diagnoses:  1. Contusion of sacrum, initial encounter     Plan: Avoid frequent bending and stooping  No lifting greater than 10 lbs. May use ice or moist heat for pain. Weight loss is of benefit. Best medication for lumbar disc disease is arthritis medications like motrin, celebrex and naprosyn. Exercise is important to improve your indurance and does allow people to function better inspite of back pain. Go to PT for balance and coordination exercises Obtain a left foot AFO to support the left ankle and prevent fallls. Fall Prevention and Home Safety Falls cause injuries and can affect all age groups. It is possible to use preventive measures to significantly decrease the likelihood of falls. There are many simple measures which can make your home safer and prevent falls. OUTDOORS  Repair cracks and edges of walkways and driveways.  Remove high doorway thresholds.  Trim shrubbery on the main path into your home.  Have good outside lighting.  Clear walkways of tools, rocks, debris, and clutter.  Check that handrails are not broken and are securely fastened. Both sides of steps should have handrails.  Have leaves, snow, and ice cleared regularly.  Use sand or salt on walkways during winter months.  In the garage, clean up grease or oil spills. BATHROOM  Install night lights.  Install grab bars by the toilet and in the tub and shower.  Use non-skid mats or decals in the tub or shower.  Place a plastic non-slip stool in the shower to sit on, if needed.  Keep floors dry and clean up all water on the floor immediately.  Remove soap buildup in the tub or shower on a regular basis.  Secure bath  mats with non-slip, double-sided rug tape.  Remove throw rugs and tripping hazards from the floors. BEDROOMS  Install night lights.  Make sure a bedside light is easy to reach.  Do not use oversized bedding.  Keep a telephone by your bedside.  Have a firm chair with side arms to use for getting dressed.  Remove throw rugs and tripping hazards from the floor. KITCHEN  Keep handles on pots and pans turned toward the center of the stove. Use back burners when possible.  Clean up spills quickly and allow time for drying.  Avoid walking on wet floors.  Avoid hot utensils and knives.  Position shelves so they are not too high or low.  Place commonly used objects within easy reach.  If necessary, use a sturdy step stool with a grab bar when reaching.  Keep electrical cables out of the way.  Do not use floor polish or wax that makes floors slippery. If you must use wax, use non-skid floor wax.  Remove throw rugs and tripping hazards from the floor. STAIRWAYS  Never leave objects on stairs.  Place handrails on both sides of stairways and use them. Fix any loose handrails. Make sure handrails on both sides of the stairways are as long as the stairs.  Check carpeting to make sure it is firmly attached along stairs. Make repairs to worn or  loose carpet promptly.  Avoid placing throw rugs at the top or bottom of stairways, or properly secure the rug with carpet tape to prevent slippage. Get rid of throw rugs, if possible.  Have an electrician put in a light switch at the top and bottom of the stairs. OTHER FALL PREVENTION TIPS  Wear low-heel or rubber-soled shoes that are supportive and fit well. Wear closed toe shoes.  When using a stepladder, make sure it is fully opened and both spreaders are firmly locked. Do not climb a closed stepladder.  Add color or contrast paint or tape to grab bars and handrails in your home. Place contrasting color strips on first and last  steps.  Learn and use mobility aids as needed. Install an electrical emergency response system.  Turn on lights to avoid dark areas. Replace light bulbs that burn out immediately. Get light switches that glow.  Arrange furniture to create clear pathways. Keep furniture in the same place.  Firmly attach carpet with non-skid or double-sided tape.  Eliminate uneven floor surfaces.  Select a carpet pattern that does not visually hide the edge of steps.  Be aware of all pets. OTHER HOME SAFETY TIPS  Set the water temperature for 120 F (48.8 C).  Keep emergency numbers on or near the telephone.  Keep smoke detectors on every level of the home and near sleeping areas. Document Released: 07/29/2002 Document Revised: 02/07/2012 Document Reviewed: 10/28/2011 [facility] Patient Information 2014 [place].    Follow-Up Instructions: No follow-ups on file.   Orders:  Orders Placed This Encounter  Procedures  . XR Pelvis 1-2 Views   No orders of the defined types were placed in this encounter.     Procedures: No procedures performed   Clinical Data: No additional findings.   Subjective: Chief Complaint  Patient presents with  . Pelvis - Pain, Injury    Fall before Christmas    73 year old female with history of thoracolumbar fusion at least 8 years ago at [facility] she had a postop menatoma with loss of function but improved with immediate return to the OR with decompression. She had a fall before Christmas and has ben having pain in the left pelvis and buttock. It is painful but is getting better. She had swelling and significant bruising, she was placing flowers on her mothers grave site and she leaned back and fell onto the edge of the grave  [place] stone marker.    Review of Systems  Constitutional: Negative for activity change, appetite change, chills, diaphoresis, fatigue, fever and unexpected weight change.  HENT: Negative for congestion and dental problem.    Musculoskeletal: Positive for back pain and myalgias. Negative for arthralgias, gait problem, joint swelling, neck pain and neck stiffness.  Skin: Positive for color change. Negative for pallor and rash.  Neurological: Negative for dizziness, tremors, seizures, syncope, facial asymmetry, speech difficulty, weakness, light-headedness, numbness and headaches.  Hematological: Negative for adenopathy. Bruises/bleeds easily.  Psychiatric/Behavioral: Negative for agitation, behavioral problems, confusion, decreased concentration, dysphoric mood, hallucinations, self-injury, sleep disturbance and suicidal ideas. The patient is not nervous/anxious and is not hyperactive.      Objective: Vital Signs: BP (!) 123/59 (BP Location: Left Arm, Patient Position: Sitting)   Pulse (!) 53   Ht 5' 1.5" (1.562 m)   Wt 134 lb (60.8 kg)   BMI 24.91 kg/m   Physical Exam  Back Exam   Tenderness  The patient is experiencing tenderness in the lumbar and cervical.  Range of  Motion  Extension: normal  Flexion: normal  Lateral bend right: normal  Lateral bend left: normal  Rotation right: normal  Rotation left: normal   Muscle Strength  Right Quadriceps:  5/5  Left Quadriceps:  5/5  Right Hamstrings:  5/5  Left Hamstrings:  5/5   Tests  Straight leg raise right: negative Straight leg raise left: negative  Reflexes  Patellar: 1/4 Achilles: 1/4 Biceps: normal Babinski's sign: normal   Other  Toe walk: normal Heel walk: normal Sensation: normal Gait: normal  Erythema: no back redness Scars: absent      Specialty Comments:  No specialty comments available.  Imaging: No results found.   PMFS History: Patient Active Problem List   Diagnosis Date Noted  . Other forms of scoliosis, thoracolumbar region 11/03/2016    Priority: High    Class: Chronic  . Spinal stenosis in cervical region 05/06/2014    Priority: High    Class: Chronic  . Cervical spondylosis without myelopathy  05/06/2014    Priority: High  . Gait disorder 07/05/2016  . HCAP (healthcare-associated pneumonia) 05/09/2014  . Acute respiratory failure with hypoxia ([facility]) 05/09/2014  . Pneumonia 05/09/2014  . Hypertension   . Hypothyroidism   . Breast cancer ([facility])   . Cervical stenosis (uterine cervix) 05/07/2014  . HNP (herniated nucleus pulposus), cervical 05/06/2014  . Adult hypothyroidism 05/03/2013  . 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 Primary malignant neoplasm of breast ([facility]) 10/24/2002   Past Medical History:  Diagnosis Date  . Arthritis   . Blood dyscrasia    bleeds and bruises easily  . Breast cancer ([facility])   . Gait disorder 07/05/2016  . Hepatitis     Hep A years ago  . Hypertension   . Hypothyroidism    Graves disease, age 81 yo  . Nose trouble    right nostril will hemorrhage at times  . Pneumonia     Family History  Problem Relation Age of Onset  . Congestive Heart Failure Mother   . Heart disease Father   . Tongue cancer Grandchild     Past Surgical History:  Procedure Laterality Date  . ABDOMINAL HYSTERECTOMY     Age 15  . ANTERIOR CERVICAL DECOMP/DISCECTOMY FUSION N/A 05/06/2014   Procedure: ANTERIOR CERVICAL DISCECTOMY FUSION C3-4 with transgraft, local bone graft, plate and screws;  Surgeon: Jessy Oto, MD;  Location: [facility];  Service: Orthopedics;  Laterality: N/A;  . APPENDECTOMY    . BREAST LUMPECTOMY Left   . COLONOSCOPY    . EYE SURGERY Bilateral    cataracts  . LUMBAR SPINE SURGERY  2011   Removal of hematoma  . ORIF HUMERUS FRACTURE Left 2013  . THORACIC SPINE SURGERY  2011   Bone cages  . THYROIDECTOMY    . TONSILLECTOMY     Social History   Occupational History  . Occupation: Retired  Tobacco Use  . Smoking status: Former Smoker    Types: Cigarettes    Last attempt to quit: 04/23/1962    Years since quitting: 56.5  . Smokeless tobacco: Never Used  . Tobacco comment: as teenager  Substance and Sexual Activity  . Alcohol use:  Yes    Comment: Social  . Drug use: No  . Sexual activity: Not on file

## 2018-10-12 ENCOUNTER — Telehealth (INDEPENDENT_AMBULATORY_CARE_PROVIDER_SITE_OTHER): Payer: Self-pay

## 2018-10-12 NOTE — Telephone Encounter (Signed)
Faxed

## 2018-10-12 NOTE — Telephone Encounter (Signed)
Karen Allison with [facility] would like an order for Outpatient Therapy and last office visit note faxed to [phone].  Cb# is [phone].  Please advise.  Thank You.

## 2018-10-12 NOTE — Telephone Encounter (Signed)
Done, faxed

## 2018-10-12 NOTE — Telephone Encounter (Signed)
Pt has an appt for physical therapy on Monday and would like to have last OV note and order faxed to the office.

## 2018-10-15 ENCOUNTER — Telehealth (INDEPENDENT_AMBULATORY_CARE_PROVIDER_SITE_OTHER): Payer: Self-pay

## 2018-10-15 DIAGNOSIS — M6281 Muscle weakness (generalized): Secondary | ICD-10-CM | POA: Diagnosis not present

## 2018-10-15 DIAGNOSIS — R2681 Unsteadiness on feet: Secondary | ICD-10-CM | POA: Diagnosis not present

## 2018-10-15 DIAGNOSIS — M25552 Pain in left hip: Secondary | ICD-10-CM | POA: Diagnosis not present

## 2018-10-15 DIAGNOSIS — R2689 Other abnormalities of gait and mobility: Secondary | ICD-10-CM | POA: Diagnosis not present

## 2018-10-15 NOTE — Telephone Encounter (Signed)
I tried to look into this but was not able to get in, looks like you maybe working on this.

## 2018-10-15 NOTE — Telephone Encounter (Signed)
[facility] called stating that PT order needs to be refaxed to [phone].  Stated that no fax was received on Friday, 10/12/2018.  Patient is currently at her appointment.  Please advise.  Thank You.

## 2018-10-15 NOTE — Telephone Encounter (Signed)
THis has already been taken care of. Thanks

## 2018-10-19 DIAGNOSIS — M25552 Pain in left hip: Secondary | ICD-10-CM | POA: Diagnosis not present

## 2018-10-19 DIAGNOSIS — R2681 Unsteadiness on feet: Secondary | ICD-10-CM | POA: Diagnosis not present

## 2018-10-19 DIAGNOSIS — M6281 Muscle weakness (generalized): Secondary | ICD-10-CM | POA: Diagnosis not present

## 2018-10-19 DIAGNOSIS — R2689 Other abnormalities of gait and mobility: Secondary | ICD-10-CM | POA: Diagnosis not present

## 2018-10-22 DIAGNOSIS — R2689 Other abnormalities of gait and mobility: Secondary | ICD-10-CM | POA: Diagnosis not present

## 2018-10-22 DIAGNOSIS — R2681 Unsteadiness on feet: Secondary | ICD-10-CM | POA: Diagnosis not present

## 2018-10-22 DIAGNOSIS — M25552 Pain in left hip: Secondary | ICD-10-CM | POA: Diagnosis not present

## 2018-10-22 DIAGNOSIS — M6281 Muscle weakness (generalized): Secondary | ICD-10-CM | POA: Diagnosis not present

## 2018-10-24 DIAGNOSIS — R2681 Unsteadiness on feet: Secondary | ICD-10-CM | POA: Diagnosis not present

## 2018-10-24 DIAGNOSIS — M25552 Pain in left hip: Secondary | ICD-10-CM | POA: Diagnosis not present

## 2018-10-24 DIAGNOSIS — R2689 Other abnormalities of gait and mobility: Secondary | ICD-10-CM | POA: Diagnosis not present

## 2018-10-24 DIAGNOSIS — M6281 Muscle weakness (generalized): Secondary | ICD-10-CM | POA: Diagnosis not present

## 2018-10-26 DIAGNOSIS — Z5111 Encounter for antineoplastic chemotherapy: Secondary | ICD-10-CM | POA: Diagnosis not present

## 2018-10-26 DIAGNOSIS — C50912 Malignant neoplasm of unspecified site of left female breast: Secondary | ICD-10-CM | POA: Diagnosis not present

## 2018-10-26 DIAGNOSIS — C787 Secondary malignant neoplasm of liver and intrahepatic bile duct: Secondary | ICD-10-CM | POA: Diagnosis not present

## 2018-10-26 DIAGNOSIS — C7800 Secondary malignant neoplasm of unspecified lung: Secondary | ICD-10-CM | POA: Diagnosis not present

## 2018-11-01 ENCOUNTER — Ambulatory Visit (INDEPENDENT_AMBULATORY_CARE_PROVIDER_SITE_OTHER): Payer: Medicare Other | Admitting: Specialist

## 2018-11-02 ENCOUNTER — Other Ambulatory Visit: Payer: Self-pay

## 2018-11-02 ENCOUNTER — Encounter (INDEPENDENT_AMBULATORY_CARE_PROVIDER_SITE_OTHER): Payer: Self-pay | Admitting: Family Medicine

## 2018-11-02 ENCOUNTER — Ambulatory Visit (INDEPENDENT_AMBULATORY_CARE_PROVIDER_SITE_OTHER): Payer: Medicare Other | Admitting: Family Medicine

## 2018-11-02 ENCOUNTER — Ambulatory Visit (INDEPENDENT_AMBULATORY_CARE_PROVIDER_SITE_OTHER): Payer: Medicare Other

## 2018-11-02 DIAGNOSIS — M25561 Pain in right knee: Secondary | ICD-10-CM

## 2018-11-02 NOTE — Progress Notes (Signed)
I saw and examined the patient with Dr. Okey Dupre and agree with assessment and plan as outlined.  Right knee contusion.  Modalities in PT.  Reassess next week.

## 2018-11-02 NOTE — Progress Notes (Signed)
  Karen Allison - 73 y.o. female MRN [id number]  Date of birth: 23-May-1946    SUBJECTIVE:      Chief Complaint: right knee pain  HPI:  73 year old female with acute right knee pain status post fall yesterday.  Patient was leaning forward and fell onto her anterior right knee with a twisting motion.  She noted immediate pain, swelling, and bruising.  Since that time she has been icing regularly and has taken Celebrex.  She continues to have pain today but does note some improvement in flexion of the knee.  She has some pain as well with rotation of the lower leg.  At baseline she uses a Rollator for ambulation.  She has a history of spinal surgery which was complicated by postoperative hematoma and as result has significant decrease incision from about the level of her waist distally in both legs.  She is in physical therapy currently to address balance issues. She denies any worsening of her neurologic symptoms.    ROS:     See HPI. All other reviewed systems negative.  PERTINENT  PMH / PSH FH / / SH:  Past Medical, Surgical, Social, and Family History Reviewed & Updated in the EMR.    OBJECTIVE: There were no vitals taken for this visit.  Physical Exam:  Vital signs are reviewed.  GEN: Alert and oriented, NAD Pulm: Breathing unlabored PSY: normal mood, congruent affect  MSK: Right knee: - Inspection: Significant swelling and ecchymosis of the anterior right knee.  Overlying abrasion to the skin.  No active bleeding. - Palpation: Diffuse tenderness over the anterior knee - ROM: She is limited in flexion of the knee.  Normal extension. - Strength: Not actively tested due to pain, but she is able to perform a straight leg raise without pain. - Neuro/vasc: decreased sensation in LE (baseline) - Special Tests: - LIGAMENTS:  no MCL or LCL laxity   Left knee:  No swelling/bruising. No skin changes Full ROM with 5/5 strength decreased sensation in LE (baseline) No MCL or LCL laxity    ASSESSMENT & PLAN:  1. Right knee pain 2/2 soft tissue and bony contusion. Knee is stable on exam. Her swelling is primarily soft tissue as opposed to a joint effusion. Xray obtained today shows no acute bony abnormalities. - continue ice several times per day - continue NSAIDs - gentle ROM then increase activity as tolerated - continue with physical therapy as tolerated - f/u with scheduled appt with Dr. Louanne Skye

## 2018-11-08 ENCOUNTER — Ambulatory Visit (INDEPENDENT_AMBULATORY_CARE_PROVIDER_SITE_OTHER): Payer: Medicare Other | Admitting: Specialist

## 2018-11-12 DIAGNOSIS — R2681 Unsteadiness on feet: Secondary | ICD-10-CM | POA: Diagnosis not present

## 2018-11-12 DIAGNOSIS — E039 Hypothyroidism, unspecified: Secondary | ICD-10-CM | POA: Diagnosis not present

## 2018-11-12 DIAGNOSIS — M7989 Other specified soft tissue disorders: Secondary | ICD-10-CM | POA: Diagnosis not present

## 2018-11-12 DIAGNOSIS — M5137 Other intervertebral disc degeneration, lumbosacral region: Secondary | ICD-10-CM | POA: Diagnosis not present

## 2018-11-12 DIAGNOSIS — Z6826 Body mass index (BMI) 26.0-26.9, adult: Secondary | ICD-10-CM | POA: Diagnosis not present

## 2018-11-16 DIAGNOSIS — Z5111 Encounter for antineoplastic chemotherapy: Secondary | ICD-10-CM | POA: Diagnosis not present

## 2018-11-16 DIAGNOSIS — C787 Secondary malignant neoplasm of liver and intrahepatic bile duct: Secondary | ICD-10-CM | POA: Diagnosis not present

## 2018-11-16 DIAGNOSIS — C7800 Secondary malignant neoplasm of unspecified lung: Secondary | ICD-10-CM | POA: Diagnosis not present

## 2018-11-16 DIAGNOSIS — C50912 Malignant neoplasm of unspecified site of left female breast: Secondary | ICD-10-CM | POA: Diagnosis not present

## 2018-12-07 DIAGNOSIS — C7800 Secondary malignant neoplasm of unspecified lung: Secondary | ICD-10-CM | POA: Diagnosis not present

## 2018-12-07 DIAGNOSIS — C50912 Malignant neoplasm of unspecified site of left female breast: Secondary | ICD-10-CM | POA: Diagnosis not present

## 2018-12-07 DIAGNOSIS — Z5111 Encounter for antineoplastic chemotherapy: Secondary | ICD-10-CM | POA: Diagnosis not present

## 2018-12-07 DIAGNOSIS — C787 Secondary malignant neoplasm of liver and intrahepatic bile duct: Secondary | ICD-10-CM | POA: Diagnosis not present

## 2018-12-11 ENCOUNTER — Encounter (INDEPENDENT_AMBULATORY_CARE_PROVIDER_SITE_OTHER): Payer: Self-pay | Admitting: Specialist

## 2018-12-11 ENCOUNTER — Telehealth: Payer: Self-pay | Admitting: Neurology

## 2018-12-11 ENCOUNTER — Encounter: Payer: Self-pay | Admitting: Neurology

## 2018-12-11 NOTE — Telephone Encounter (Signed)
I called pt. Pt reports that Dr. Louanne Skye wants her evaluated for falls, "too many to count."  Pt's meds, allergies, and PMH were updated.  I helped pt download the webex app on her laptop. Pt understands to click the green "join meeting" button in her email prior to the appt on 12/13/18.

## 2018-12-11 NOTE — Addendum Note (Signed)
Addended by: Lester  A on: 12/11/2018 03:24 PM   Modules accepted: Orders

## 2018-12-11 NOTE — Telephone Encounter (Signed)
12-11-2018 Pt has called and gave verbal consent to file insurance for VV on 12-13-2018. Pt email address:[email or web address]  pt asked to download [employer] webex app/program [email or web address]

## 2018-12-11 NOTE — Telephone Encounter (Signed)
I have sent the email webex invitation to patient.

## 2018-12-11 NOTE — Telephone Encounter (Signed)
Link and appt added to webex calendar.

## 2018-12-13 ENCOUNTER — Other Ambulatory Visit: Payer: Self-pay

## 2018-12-13 ENCOUNTER — Institutional Professional Consult (permissible substitution): Payer: Medicare Other | Admitting: Neurology

## 2018-12-13 ENCOUNTER — Telehealth: Payer: Self-pay | Admitting: Neurology

## 2018-12-13 ENCOUNTER — Ambulatory Visit (INDEPENDENT_AMBULATORY_CARE_PROVIDER_SITE_OTHER): Payer: Medicare Other | Admitting: Neurology

## 2018-12-13 ENCOUNTER — Encounter: Payer: Self-pay | Admitting: Neurology

## 2018-12-13 DIAGNOSIS — R269 Unspecified abnormalities of gait and mobility: Secondary | ICD-10-CM | POA: Diagnosis not present

## 2018-12-13 DIAGNOSIS — M6281 Muscle weakness (generalized): Secondary | ICD-10-CM | POA: Diagnosis not present

## 2018-12-13 NOTE — Progress Notes (Signed)
Virtual Visit via Video Note  I connected with Karen Allison on 12/13/18 at  2:00 PM EDT by a video enabled telemedicine application and verified that I am speaking with the correct person using two identifiers.   I discussed the limitations of evaluation and management by telemedicine and the availability of in person appointments. The patient expressed understanding and agreed to proceed.  The patient is at home, physician in office.  History of Present Illness: Karen Allison is a 73 year old right-handed white female with a history of lumbosacral spine surgery done 7654 that was complicated by an epidural hematoma.  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  The left leg was slightly weaker than the right, the patient was seen through this office in November 2017 for this reason.  The patient at that time did not wish to undergo nerve conduction and EMG evaluation.  Over the last 18 months she has had a gradual worsening of her gait, she has had increased numbness particular below the knees to include the feet.  She has had several falls, she had a significant fall in December 2019 and fractured the sacrum.  The patient has now been using a walker for ambulation since that time.  The patient does have some urinary incontinence but she does not believe her bladder function is changed over the last 18 months.  She denies any significant problems with the arms, she will drop things out of her hands on occasion but she denies any weakness of the arms.  She feels that her legs are heavy when she tries to walk.  The knees feel stiff.  She does note some slight neck pain, she does have a history of prior neck surgery.  She was working with physical therapy on balance training which was offering some benefit.  The patient does have a history of metastatic breast cancer, she remains on chronic Herceptin therapy.    Observations/Objective: The WebEx evaluation reveals that the patient is alert and cooperative.  Speech is well enunciated, not aphasic.  Extraocular movements are full.  The patient is able to protrude the tongue in the midline with good lateral movement the tongue.  She is able to form finger-nose-finger.  Gait is slightly wide-based, the patient can walk without assistance.  She is unable to perform tandem gait.  Romberg is unsteady but she does not fall.  Assessment and Plan: 1.  History of thoracic myelopathy  2.  Gait disorder  The patient has had worsening of her ability to function and walk over the last 18 months.  She has had increased numbness below the knees bilaterally but she does have numbness to the waistline bilaterally with chronic bilateral leg weakness.  Given the changes in her clinical condition, and a history of breast cancer, we will check MRI of the thoracic spine and lumbar spine with and without gadolinium enhancement.  She will be set up for nerve conduction studies of both legs and EMG of the left leg.  She will follow-up for a full evaluation in 3 months.  Follow Up Instructions: 72-month follow-up with me.   I discussed the assessment and treatment plan with the patient. The patient was provided an opportunity to ask questions and all were answered. The patient agreed with the plan and demonstrated an understanding of the instructions.   The patient was advised to call back or seek an in-person evaluation if  the symptoms worsen or if the condition fails to improve as anticipated.  I provided 30 minutes of non-face-to-face time during this encounter.   Kathrynn Ducking, MD

## 2018-12-13 NOTE — Telephone Encounter (Signed)
Medicare/bcbs supp order sent to GI. No auth they will reach out to the patient to schedule.  

## 2018-12-14 ENCOUNTER — Ambulatory Visit (INDEPENDENT_AMBULATORY_CARE_PROVIDER_SITE_OTHER): Payer: Medicare Other | Admitting: Specialist

## 2018-12-19 ENCOUNTER — Telehealth: Payer: Self-pay | Admitting: *Deleted

## 2018-12-19 NOTE — Telephone Encounter (Signed)
Please call to schedule a 3 month follow up with Dr. Jannifer Franklin.

## 2018-12-20 NOTE — Telephone Encounter (Signed)
I called the patient.  I indicated that the main problem is that the patient is reporting increased numbness and weakness of the legs, for this reason, looking at the spine and nerves coming off the spine is more important, we will check MRI of the lumbar and thoracic spine first.  The patient goes on to indicate that she has been set up to get a CT scan of the chest and abdomen and pelvis in the near future, this will evaluate the soft tissue structures and bony structures of the pelvis anyway.

## 2018-12-20 NOTE — Telephone Encounter (Addendum)
I contacted the pt and was able to schedule her 3 month f/u for 03/18/19 at 12pm. While I was on the phone pt wanted to know if a MRI of her pelvis could be added to the lumbar and thoracic order? Pt believes the MRI of the pelvis would be able to pin point the cause of the numbness/weakness in her legs. Patient has scheduled her lumbar/thoracic MRI for 02/01/2019.  I advised I would send MRI request for MD to review. Pt was agreeable.

## 2018-12-27 ENCOUNTER — Ambulatory Visit: Payer: Self-pay

## 2018-12-27 ENCOUNTER — Ambulatory Visit: Payer: Medicare Other | Admitting: Specialist

## 2018-12-27 ENCOUNTER — Encounter (HOSPITAL_COMMUNITY): Payer: Self-pay

## 2018-12-27 ENCOUNTER — Encounter: Payer: Self-pay | Admitting: Specialist

## 2018-12-27 ENCOUNTER — Ambulatory Visit (INDEPENDENT_AMBULATORY_CARE_PROVIDER_SITE_OTHER): Payer: Medicare Other | Admitting: Specialist

## 2018-12-27 ENCOUNTER — Other Ambulatory Visit: Payer: Self-pay

## 2018-12-27 ENCOUNTER — Ambulatory Visit (HOSPITAL_COMMUNITY)
Admission: RE | Admit: 2018-12-27 | Discharge: 2018-12-27 | Disposition: A | Discharge status: Home / Self Care | Payer: Medicare Other | Source: Ambulatory Visit | Attending: Specialist | Admitting: Specialist

## 2018-12-27 VITALS — BP 132/62 | HR 58 | Ht 61.5 in | Wt 134.0 lb

## 2018-12-27 DIAGNOSIS — M25562 Pain in left knee: Secondary | ICD-10-CM

## 2018-12-27 DIAGNOSIS — M25561 Pain in right knee: Secondary | ICD-10-CM

## 2018-12-27 DIAGNOSIS — S83411S Sprain of medial collateral ligament of right knee, sequela: Secondary | ICD-10-CM | POA: Diagnosis not present

## 2018-12-27 DIAGNOSIS — M79661 Pain in right lower leg: Secondary | ICD-10-CM

## 2018-12-27 MED ORDER — METHYLPREDNISOLONE ACETATE 40 MG/ML IJ SUSP
40.0000 mg | INTRAMUSCULAR | Status: AC | PRN
  Administered 2018-12-27: 40 mg via INTRA_ARTICULAR

## 2018-12-27 MED ORDER — BUPIVACAINE HCL 0.25 % IJ SOLN
6.0000 mL | INTRAMUSCULAR | Status: AC | PRN
  Administered 2018-12-27: 6 mL via INTRA_ARTICULAR

## 2018-12-27 MED ORDER — LIDOCAINE HCL 1 % IJ SOLN
3.0000 mL | INTRAMUSCULAR | Status: AC | PRN
  Administered 2018-12-27: 3 mL

## 2018-12-27 NOTE — Progress Notes (Signed)
Office Visit Note   Patient: Karen Allison           Date of Birth: [birth year]           MRN: [id number] Visit Date: 12/27/2018              Requested by: [name], MD [place] [place],  [id number] PCP: [name], MD   Assessment & Plan: Visit Diagnoses:  1. Right knee pain, unspecified chronicity   2. Left knee pain, unspecified chronicity   3. Right calf pain   4. Sprain of medial collateral ligament of right knee, sequela     Plan: In hopes of giving patient some relief of her right knee pain offered injection.  After patient consent right knee was prepped with Betadine and intra-articular Marcaine/Depo-Medrol injection performed.  Tolerated injection without complication.  After sitting for a few minutes patient did report improvement of her knee pain with Marcaine in place.  With her ongoing right posterior knee pain and calf pain I recommend sending her for venous Doppler to rule out DVT.  She will go directly to the vascular imaging facility and I will await callback report.  She has brought up some ongoing issues with right-sided sciatica and is scheduled to have thoracic and lumbar spine MRI scans ordered by neurology.  Follow-Up Instructions: Return in about 2 weeks (around 01/10/2019) for recheck right knee, lateral hip.   Orders:  Orders Placed This Encounter  Procedures  . Large Joint Inj  . XR KNEE 3 VIEW RIGHT  . XR KNEE 3 VIEW LEFT   No orders of the defined types were placed in this encounter.     Procedures: Large Joint Inj: R knee on 12/27/2018 2:53 PM Indications: pain Details: 25 G 1.5 in needle, anteromedial approach Medications: 3 mL lidocaine 1 %; 6 mL bupivacaine 0.25 %; 40 mg methylPREDNISolone acetate 40 MG/ML Outcome: tolerated well, no immediate complications Consent was given by the patient. Patient was prepped and draped in the usual sterile fashion.       Clinical Data: No additional findings.   Subjective:  Chief Complaint  Patient presents with  . Right Knee - Follow-up    HPI 73 year old white female comes in today with complaints of right greater left knee pain.  Patient was seen by Dr. Junius Roads and his resident November 02, 2018 for right knee pain after she suffered an injury.  Diagnosed with a contusion.  Patient did show me pictures of her right knee after that injury which showed fairly significant bruising more so along the medial aspect of the knee.  Bruising has since resolved.  Patient states that she suffered another fall landing on both knees a couple weeks after visit in March.  Both knees painful with ambulation.  On the right she has had some swelling posterior aspect with pain but also extends into the right calf.  No complaints of chest pain shortness of breath.  Both knees aggravated when she is ambulating and getting up from a seated position.  She has a history of chronic gait issues and has been followed by neurologist Dr. Jannifer Franklin.  She does have thoracic and lumbar MRI scans scheduled by him. Review of Systems No current cardiac pulmonary GI issues  Objective: Vital Signs: BP 132/62 (BP Location: Left Arm, Patient Position: Sitting)   Pulse (!) 58   Ht 5' 1.5" (1.562 m)   Wt 134 lb (60.8 kg)   BMI 24.91 kg/m  Physical Exam HENT:     Head: Normocephalic.  Eyes:     Pupils: Pupils are equal, round, and reactive to light.  Pulmonary:     Effort: No respiratory distress.  Musculoskeletal:     Comments: Negative logroll bilateral hips.  She is mild to moderately tender over the right hip greater trochanter bursa.  Both knees mild patellofemoral crepitus.  Right knee medial greater than lateral joint line tenderness.  She is also somewhat tender over the MCL.  Ligaments feel stable.  Left knee less tender over the joint line than the right.  Ligament stable.  Bile knees good range of motion.  Moderate to marked right calf tenderness.  Neurological:     Mental Status: She is  alert.  Psychiatric:        Mood and Affect: Mood normal.     Ortho Exam  Specialty Comments:  No specialty comments available.  Imaging: No results found.   PMFS History: Patient Active Problem List   Diagnosis Date Noted  . Other forms of scoliosis, thoracolumbar region 11/03/2016    Class: Chronic  . Gait disorder 07/05/2016  . HCAP (healthcare-associated pneumonia) 05/09/2014  . Acute respiratory failure with hypoxia ([place]) 05/09/2014  . Pneumonia 05/09/2014  . Hypertension   . Hypothyroidism   . Breast cancer ([place])   . Cervical stenosis (uterine cervix) 05/07/2014  . Spinal stenosis in cervical region 05/06/2014    Class: Chronic  . Cervical spondylosis without myelopathy 05/06/2014  . HNP (herniated nucleus pulposus), cervical 05/06/2014  . Adult hypothyroidism 05/03/2013  . 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 Primary malignant neoplasm of breast ([place]) 10/24/2002   Past Medical History:  Diagnosis Date  . Arthritis   . Blood dyscrasia    bleeds and bruises easily  . Breast cancer ([place])   . Gait disorder 07/05/2016  . Hepatitis     Hep A years ago  . Hypertension   . Hypothyroidism    Graves disease, age 61 yo  . Nose trouble    right nostril will hemorrhage at times  . Pneumonia   . Rosacea     Family History  Problem Relation Age of Onset  . Congestive Heart Failure Mother   . Heart disease Father   . Tongue cancer Grandchild     Past Surgical History:  Procedure Laterality Date  . ABDOMINAL HYSTERECTOMY     Age 56  . ANTERIOR CERVICAL DECOMP/DISCECTOMY FUSION N/A 05/06/2014   Procedure: ANTERIOR CERVICAL DISCECTOMY FUSION C3-4 with transgraft, local bone graft, plate and screws;  Surgeon: Jessy Oto, MD;  Location: [facility];  Service: Orthopedics;  Laterality: N/A;  . APPENDECTOMY    . BREAST LUMPECTOMY Left   . COLONOSCOPY    . EYE SURGERY Bilateral    cataracts  . LUMBAR SPINE SURGERY  2011   Removal of hematoma  . ORIF HUMERUS  FRACTURE Left 2013  . THORACIC SPINE SURGERY  2011   Bone cages  . THYROIDECTOMY    . TONSILLECTOMY     Social History   Occupational History  . Occupation: Retired  Tobacco Use  . Smoking status: Former Smoker    Types: Cigarettes    Last attempt to quit: 04/23/1962    Years since quitting: 56.7  . Smokeless tobacco: Never Used  . Tobacco comment: as teenager  Substance and Sexual Activity  . Alcohol use: Yes    Comment: Social  . Drug use: No  . Sexual activity: Not on file

## 2018-12-27 NOTE — Progress Notes (Signed)
Right lower venous has been completed and is negative for DVT. Preliminary results can be found under CV proc through chart review.  Trequan Marsolek RVT [facility]  

## 2018-12-28 DIAGNOSIS — C50912 Malignant neoplasm of unspecified site of left female breast: Secondary | ICD-10-CM | POA: Diagnosis not present

## 2018-12-28 DIAGNOSIS — C787 Secondary malignant neoplasm of liver and intrahepatic bile duct: Secondary | ICD-10-CM | POA: Diagnosis not present

## 2018-12-28 DIAGNOSIS — C7800 Secondary malignant neoplasm of unspecified lung: Secondary | ICD-10-CM | POA: Diagnosis not present

## 2018-12-28 DIAGNOSIS — Z5111 Encounter for antineoplastic chemotherapy: Secondary | ICD-10-CM | POA: Diagnosis not present

## 2018-12-31 DIAGNOSIS — R1013 Epigastric pain: Secondary | ICD-10-CM | POA: Diagnosis not present

## 2019-01-10 ENCOUNTER — Ambulatory Visit: Payer: Medicare Other | Admitting: Specialist

## 2019-01-18 DIAGNOSIS — C7800 Secondary malignant neoplasm of unspecified lung: Secondary | ICD-10-CM | POA: Diagnosis not present

## 2019-01-18 DIAGNOSIS — Z5111 Encounter for antineoplastic chemotherapy: Secondary | ICD-10-CM | POA: Diagnosis not present

## 2019-01-18 DIAGNOSIS — C50912 Malignant neoplasm of unspecified site of left female breast: Secondary | ICD-10-CM | POA: Diagnosis not present

## 2019-01-18 DIAGNOSIS — C787 Secondary malignant neoplasm of liver and intrahepatic bile duct: Secondary | ICD-10-CM | POA: Diagnosis not present

## 2019-01-25 ENCOUNTER — Ambulatory Visit: Payer: Self-pay | Admitting: Specialist

## 2019-02-01 ENCOUNTER — Ambulatory Visit
Admission: RE | Admit: 2019-02-01 | Discharge: 2019-02-01 | Disposition: A | Discharge status: Home / Self Care | Payer: Medicare Other | Source: Ambulatory Visit | Attending: Neurology | Admitting: Neurology

## 2019-02-01 ENCOUNTER — Other Ambulatory Visit: Payer: Self-pay

## 2019-02-01 DIAGNOSIS — R269 Unspecified abnormalities of gait and mobility: Secondary | ICD-10-CM

## 2019-02-01 DIAGNOSIS — M6281 Muscle weakness (generalized): Secondary | ICD-10-CM

## 2019-02-01 MED ORDER — GADOBENATE DIMEGLUMINE 529 MG/ML IV SOLN
12.0000 mL | Freq: Once | INTRAVENOUS | Status: AC | PRN
  Administered 2019-02-01: 12 mL via INTRAVENOUS

## 2019-02-03 ENCOUNTER — Telehealth: Payer: Self-pay | Admitting: Neurology

## 2019-02-03 NOTE — Telephone Encounter (Signed)
I called the patient.  MRI of the lumbar spine and thoracic spine do not show an etiology for the sensory changes and gait changes that the patient has noted.  EMG nerve conduction study has been ordered, this will be done on 12 February 2019.  We will look for evidence of a peripheral neuropathy.   MRI lumbar 02/02/19:  IMPRESSION: This MRI of the lumbar spine with and without contrast shows the following: 1.   Extensive stable surgical changes from T12-S1 2.   There does not appear to be any nerve root compression and there is no spinal stenosis. 3.   Stable fluid collection posteriorly to the left at L3-L4  4.   There does not appear to be any epidural fibrosis.   MRI thoracic 02/02/19:  IMPRESSION: This MRI of the thoracic spine with and without contrast shows the following: 1.   Multilevel degenerative changes as detailed above that do not lead to spinal stenosis or nerve root compression. 2.   Compared to the MRI dated 06/21/2013, there has been some progression of the degenerative changes at T7-T8, T8-T9 and T10-T11 3.   Stable postoperative changes at T12 and below.   4.   There is a normal enhancement pattern and no acute findings.

## 2019-02-08 DIAGNOSIS — Z5111 Encounter for antineoplastic chemotherapy: Secondary | ICD-10-CM | POA: Diagnosis not present

## 2019-02-08 DIAGNOSIS — C7800 Secondary malignant neoplasm of unspecified lung: Secondary | ICD-10-CM | POA: Diagnosis not present

## 2019-02-08 DIAGNOSIS — C50912 Malignant neoplasm of unspecified site of left female breast: Secondary | ICD-10-CM | POA: Diagnosis not present

## 2019-02-08 DIAGNOSIS — C787 Secondary malignant neoplasm of liver and intrahepatic bile duct: Secondary | ICD-10-CM | POA: Diagnosis not present

## 2019-02-11 ENCOUNTER — Telehealth: Payer: Self-pay

## 2019-02-11 NOTE — Telephone Encounter (Signed)
I called and spoke with pt, confirmed appt and advised her of the current chk in process.

## 2019-02-12 ENCOUNTER — Other Ambulatory Visit: Payer: Self-pay

## 2019-02-12 ENCOUNTER — Ambulatory Visit (INDEPENDENT_AMBULATORY_CARE_PROVIDER_SITE_OTHER): Payer: Medicare Other | Admitting: Neurology

## 2019-02-12 ENCOUNTER — Encounter: Payer: Self-pay | Admitting: Neurology

## 2019-02-12 DIAGNOSIS — M6281 Muscle weakness (generalized): Secondary | ICD-10-CM

## 2019-02-12 DIAGNOSIS — R269 Unspecified abnormalities of gait and mobility: Secondary | ICD-10-CM

## 2019-02-12 NOTE — Procedures (Signed)
     HISTORY:  Karen Allison is a 73 year old white female with a history of a prior lumbosacral spine surgery, the patient had complications of surgery that resulted in a lower spinal cord injury, the patient has had chronic weakness and gait instability since that time.  She returns for further evaluation of some decline in her ability to ambulate.  NERVE CONDUCTION STUDIES:  Nerve conduction studies were performed on both lower extremities.  No response was seen for the left peroneal nerve, the distal motor latency for the right peroneal nerve was normal but with a low motor amplitude.  The distal motor latencies and motor amplitudes for the posterior tibial nerves were normal bilaterally.  The nerve conduction velocities for the posterior tibial nerves bilaterally and for the right peroneal nerve were normal.  The sensory latencies for the sural nerves and for the right peroneal nerve were normal but were unobtainable for the left peroneal nerve.  The F-wave latencies for the posterior tibial nerves were normal bilaterally.  EMG STUDIES:  EMG study was performed on the left lower extremity:  The tibialis anterior muscle reveals 2 to 5K motor units with decreased recruitment. No fibrillations or positive waves were seen. The peroneus tertius muscle reveals 2 to 4K motor units with full recruitment. No fibrillations or positive waves were seen. The medial gastrocnemius muscle reveals 1 to 3K motor units with full recruitment. No fibrillations or positive waves were seen. The vastus lateralis muscle reveals 2 to 6K motor units with significantly reduced recruitment. No fibrillations or positive waves were seen. The iliopsoas muscle reveals 2 to 4K motor units with full recruitment. No fibrillations or positive waves were seen. The biceps femoris muscle (long head) reveals 2 to 4K motor units with full recruitment. No fibrillations or positive waves were seen. The lumbosacral paraspinal muscles  were tested at 3 levels, and revealed no abnormalities of insertional activity at all 3 levels tested.  Electrically active muscle tissue in the paraspinal muscles was minimal.  There was good relaxation.   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  No other significant abnormalities were seen.  Jill Alexanders MD 02/12/2019 1:45 PM  [facility] [address] [place], [place] [address]  Phone [phone] Fax [phone]

## 2019-02-12 NOTE — Progress Notes (Addendum)
The patient comes in for EMG nerve conduction study evaluation.  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  No active denervation is seen, there is no evidence of a generalized peripheral neuropathy.  The patient is to get back into physical therapy for leg strengthening exercises and gait training.  I am not clear that there is anything easily treated or fully reversible going on with her chronic gait disorder.     [place]    Nerve / Sites Muscle Latency Ref. Amplitude Ref. Rel Amp Segments Distance Velocity Ref. Area    ms ms mV mV %  cm m/s m/s mVms  L Peroneal - EDB     Ankle EDB NR ?6.5 NR ?2.0 NR Ankle - EDB 9   NR     Fib head EDB NR  NR  NR Fib head - Ankle 25 NR ?44 NR  R Peroneal - EDB     Ankle EDB 4.6 ?6.5 1.4 ?2.0 100 Ankle - EDB 9   6.0     Fib head EDB 10.4  1.3  91.6 Fib head - Ankle 26 45 ?44 5.4     Pop fossa EDB 12.2  1.5  118 Pop fossa - Fib head 10 53 ?44 8.7         Pop fossa - Ankle      L Tibial - AH     Ankle AH 3.9 ?5.8 7.0 ?4.0 100 Ankle - AH 9   13.1     Pop fossa AH 11.0  5.4  76.5 Pop fossa - Ankle 32 45 ?41 11.9  R Tibial - AH     Ankle AH 4.1 ?5.8 5.4 ?4.0 100 Ankle - AH 9   15.4     Pop fossa AH 12.0  4.8  88.6 Pop fossa - Ankle 33 42 ?41 13.7             SNC    Nerve / Sites Rec. Site Peak Lat Ref.  Amp Ref. Segments Distance    ms ms V V  cm  L Sural - Ankle (Calf)     Calf Ankle 2.9 ?4.4 6 ?6 Calf - Ankle 14  R Sural - Ankle (Calf)     Calf Ankle 3.1 ?4.4 6 ?6 Calf - Ankle 14  L Superficial peroneal - Ankle     Lat leg Ankle NR ?4.4 NR ?6 Lat leg - Ankle 14  R Superficial peroneal - Ankle     Lat leg Ankle 3.6 ?4.4 3 ?6 Lat leg - Ankle 14             F  Wave    Nerve F Lat Ref.   ms ms  L Tibial - AH 52.9 ?56.0  R Tibial - AH 49.8 ?56.0

## 2019-02-12 NOTE — Progress Notes (Signed)
Please refer to EMG and nerve conduction procedure note.  

## 2019-02-14 ENCOUNTER — Ambulatory Visit: Payer: Medicare Other | Admitting: Specialist

## 2019-02-18 DIAGNOSIS — M5137 Other intervertebral disc degeneration, lumbosacral region: Secondary | ICD-10-CM | POA: Diagnosis not present

## 2019-02-18 DIAGNOSIS — E039 Hypothyroidism, unspecified: Secondary | ICD-10-CM | POA: Diagnosis not present

## 2019-02-18 DIAGNOSIS — I10 Essential (primary) hypertension: Secondary | ICD-10-CM | POA: Diagnosis not present

## 2019-02-18 DIAGNOSIS — Z6825 Body mass index (BMI) 25.0-25.9, adult: Secondary | ICD-10-CM | POA: Diagnosis not present

## 2019-02-28 DIAGNOSIS — C787 Secondary malignant neoplasm of liver and intrahepatic bile duct: Secondary | ICD-10-CM | POA: Diagnosis not present

## 2019-02-28 DIAGNOSIS — C50912 Malignant neoplasm of unspecified site of left female breast: Secondary | ICD-10-CM | POA: Diagnosis not present

## 2019-03-01 DIAGNOSIS — T451X1D Poisoning by antineoplastic and immunosuppressive drugs, accidental (unintentional), subsequent encounter: Secondary | ICD-10-CM | POA: Diagnosis not present

## 2019-03-01 DIAGNOSIS — C787 Secondary malignant neoplasm of liver and intrahepatic bile duct: Secondary | ICD-10-CM | POA: Diagnosis not present

## 2019-03-01 DIAGNOSIS — C7800 Secondary malignant neoplasm of unspecified lung: Secondary | ICD-10-CM | POA: Diagnosis not present

## 2019-03-01 DIAGNOSIS — I351 Nonrheumatic aortic (valve) insufficiency: Secondary | ICD-10-CM | POA: Diagnosis not present

## 2019-03-01 DIAGNOSIS — C50912 Malignant neoplasm of unspecified site of left female breast: Secondary | ICD-10-CM | POA: Diagnosis not present

## 2019-03-01 DIAGNOSIS — Z5111 Encounter for antineoplastic chemotherapy: Secondary | ICD-10-CM | POA: Diagnosis not present

## 2019-03-05 DIAGNOSIS — C50912 Malignant neoplasm of unspecified site of left female breast: Secondary | ICD-10-CM | POA: Diagnosis not present

## 2019-03-05 DIAGNOSIS — C787 Secondary malignant neoplasm of liver and intrahepatic bile duct: Secondary | ICD-10-CM | POA: Diagnosis not present

## 2019-03-18 ENCOUNTER — Ambulatory Visit: Payer: Self-pay | Admitting: Neurology

## 2019-03-22 DIAGNOSIS — C787 Secondary malignant neoplasm of liver and intrahepatic bile duct: Secondary | ICD-10-CM | POA: Diagnosis not present

## 2019-03-22 DIAGNOSIS — C50912 Malignant neoplasm of unspecified site of left female breast: Secondary | ICD-10-CM | POA: Diagnosis not present

## 2019-03-22 DIAGNOSIS — C7800 Secondary malignant neoplasm of unspecified lung: Secondary | ICD-10-CM | POA: Diagnosis not present

## 2019-03-28 ENCOUNTER — Ambulatory Visit (INDEPENDENT_AMBULATORY_CARE_PROVIDER_SITE_OTHER): Payer: Medicare Other | Admitting: Specialist

## 2019-03-28 ENCOUNTER — Encounter: Payer: Self-pay | Admitting: Specialist

## 2019-03-28 ENCOUNTER — Other Ambulatory Visit: Payer: Self-pay

## 2019-03-28 VITALS — BP 151/66 | HR 57 | Ht 61.5 in | Wt 134.0 lb

## 2019-03-28 DIAGNOSIS — M7051 Other bursitis of knee, right knee: Secondary | ICD-10-CM

## 2019-03-28 DIAGNOSIS — M25552 Pain in left hip: Secondary | ICD-10-CM

## 2019-03-28 DIAGNOSIS — M7062 Trochanteric bursitis, left hip: Secondary | ICD-10-CM | POA: Diagnosis not present

## 2019-03-28 MED ORDER — DICLOFENAC SODIUM 1 % TD GEL: 4.0000 g | Freq: Four times a day (QID) | TRANSDERMAL | 3 refills | Status: DC

## 2019-03-28 MED ORDER — BUPIVACAINE HCL 0.25 % IJ SOLN
4.0000 mL | INTRAMUSCULAR | Status: AC | PRN
  Administered 2019-03-28: 4 mL via INTRA_ARTICULAR

## 2019-03-28 MED ORDER — METHYLPREDNISOLONE ACETATE 40 MG/ML IJ SUSP
40.0000 mg | INTRAMUSCULAR | Status: AC | PRN
  Administered 2019-03-28: 40 mg via INTRA_ARTICULAR

## 2019-03-28 NOTE — Progress Notes (Signed)
Office Visit Note   Patient: Karen Allison           Date of Birth: June 12, 1946           MRN: [id number] Visit Date: 03/28/2019              Requested by: [name], MD [address] [place],  [place] [address] PCP: [name], MD   Assessment & Plan: Visit Diagnoses:  1. Pain of left hip joint   2. Greater trochanteric bursitis of left hip   3. Pes anserinus bursitis of right knee     Plan:  Knee is suffering from pes bursitis, left hip greater trochanteric bursitis. Well padded shoes help. Ice the knee 2-3 times a day 15-20 mins at a time. Tylenol for pain and voltaren gel applied to the knee and hip 3-4 times per day.   Follow-Up Instructions: No follow-ups on file.   Orders:  Orders Placed This Encounter  Procedures  . Large Joint Inj: R knee  . Large Joint Inj: L greater trochanter   No orders of the defined types were placed in this encounter.     Procedures: Large Joint Inj: R knee on 03/28/2019 2:48 PM Indications: pain Details: 25 G 1.5 in needle, anterolateral approach  Arthrogram: No  Medications: 4 mL bupivacaine 0.25 %; 40 mg methylPREDNISolone acetate 40 MG/ML Outcome: tolerated well, no immediate complications  Right pes anserina bursa injected.  Procedure, treatment alternatives, risks and benefits explained, specific risks discussed. Consent was given by the patient. Immediately prior to procedure a time out was called to verify the correct patient, procedure, equipment, support staff and site/side marked as required. Patient was prepped and draped in the usual sterile fashion.   Large Joint Inj: L greater trochanter on 03/28/2019 2:49 PM Indications: pain Details: 25 G 3.5 in needle, superolateral approach  Arthrogram: No  Medications: 40 mg methylPREDNISolone acetate 40 MG/ML; 4 mL bupivacaine 0.25 % Outcome: tolerated well, no immediate complications  Left greaater troch bursa injection, MRI with 3 cc fluid collection below  left greater trochanter between ITB and vastus lateralis. Procedure, treatment alternatives, risks and benefits explained, specific risks discussed. Consent was given by the patient. Immediately prior to procedure a time out was called to verify the correct patient, procedure, equipment, support staff and site/side marked as required. Patient was prepped and draped in the usual sterile fashion.       Clinical Data: No additional findings.   Subjective: Chief Complaint  Patient presents with  . Right Knee - Follow-up  . Left Knee - Follow-up    73 year old female with history of lumbar decompression and long fusion L1 to S1. She is having repetative falling episodes with bending and any time unless she is holding onto something. She fell backwards last week landing on the left buttock and had a bruise left buttock. She saw Dr. 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 She had MRI of the lumbar spine and left hip. Returns today with persistent left posterior buttock central pain. No ischial pain. With tenderness of the left lateral and inferior sacrum and medial gluteus. No bowel or bladder difficulty.    Review of Systems  Constitutional: Negative.  Negative for activity change, appetite change, chills, diaphoresis, fatigue, fever and unexpected weight change.  HENT: Negative.  Negative for congestion, dental problem, drooling, ear discharge, ear pain, facial swelling, hearing loss, mouth sores, nosebleeds, postnasal  drip, rhinorrhea, sinus pressure, sinus pain, sneezing, sore throat, tinnitus, trouble swallowing and voice change.   Eyes: Negative.  Negative for photophobia, pain, discharge, redness and visual disturbance.  Respiratory: Negative for apnea, cough, choking, chest tightness, shortness of breath, wheezing and stridor.   Cardiovascular: Negative for chest pain, palpitations and leg swelling.  Gastrointestinal: Negative  for abdominal distention, abdominal pain, anal bleeding, blood in stool, constipation, diarrhea, nausea, rectal pain and vomiting.  Endocrine: Positive for cold intolerance. Negative for heat intolerance, polydipsia, polyphagia and polyuria.  Genitourinary: Negative for difficulty urinating, dyspareunia, dysuria, enuresis, flank pain, frequency, genital sores, hematuria and urgency.  Musculoskeletal: Positive for back pain. Negative for arthralgias, gait problem, joint swelling, myalgias, neck pain and neck stiffness.  Skin: Negative for color change, pallor, rash and wound.  Allergic/Immunologic: Negative for environmental allergies, food allergies and immunocompromised state.  Neurological: Positive for weakness and numbness. Negative for dizziness, tremors, seizures, syncope, facial asymmetry, speech difficulty, light-headedness and headaches.  Hematological: Bruises/bleeds easily.  Psychiatric/Behavioral: Negative for agitation, behavioral problems, confusion, decreased concentration, dysphoric mood, hallucinations, self-injury, sleep disturbance and suicidal ideas. The patient is not nervous/anxious and is not hyperactive.      Objective: Vital Signs: BP (!) 151/66 (BP Location: Left Arm, Patient Position: Sitting)   Pulse (!) 57   Ht 5' 1.5" (1.562 m)   Wt 134 lb (60.8 kg)   BMI 24.91 kg/m   Physical Exam Constitutional:      Appearance: She is well-developed.  HENT:     Head: Normocephalic and atraumatic.  Eyes:     Pupils: Pupils are equal, round, and reactive to light.  Neck:     Musculoskeletal: Normal range of motion and neck supple.  Pulmonary:     Effort: Pulmonary effort is normal.     Breath sounds: Normal breath sounds.  Abdominal:     General: Bowel sounds are normal.     Palpations: Abdomen is soft.  Skin:    General: Skin is warm and dry.  Neurological:     Mental Status: She is alert and oriented to person, place, and time.  Psychiatric:        Behavior:  Behavior normal.        Thought Content: Thought content normal.        Judgment: Judgment normal.     Right Knee Exam   Tenderness  The patient is experiencing tenderness in the pes anserinus.  Range of Motion  Extension: normal  Flexion: normal   Tests  McMurray:  Medial - positive Lateral - negative Varus: positive Valgus: positive   Left Knee Exam   Tenderness  The patient is experiencing no tenderness.    Left Hip Exam   Tenderness  The patient is experiencing tenderness in the greater trochanter.  Range of Motion  Abduction: normal  Adduction: normal  Extension: normal  Flexion: normal  External rotation: normal  Internal rotation: normal   Muscle Strength  Abduction: 4/5  Adduction: 5/5  Flexion: 5/5   Tests  FABER: positive Ober: positive  Other  Erythema: absent Scars: absent Sensation: normal Pulse: present  Comments:  Left greater trochanteric bursitis.   Back Exam   Tenderness  The patient is experiencing tenderness in the lumbar.  Range of Motion  Extension: abnormal  Flexion: normal  Lateral bend right: abnormal  Lateral bend left: abnormal  Rotation right: abnormal  Rotation left: abnormal   Muscle Strength  Right Quadriceps:  5/5  Left Quadriceps:  5/5  Right  Hamstrings:  5/5  Left Hamstrings:  5/5   Tests  Straight leg raise right: negative Straight leg raise left: negative  Reflexes  Patellar: 2/4 Achilles: 2/4 Babinski's sign: normal   Other  Toe walk: normal Heel walk: normal Sensation: normal Gait: normal  Erythema: no back redness Scars: absent      Specialty Comments:  No specialty comments available.  Imaging: No results found.   PMFS History: Patient Active Problem List   Diagnosis Date Noted  . Other forms of scoliosis, thoracolumbar region 11/03/2016    Priority: High    Class: Chronic  . Spinal stenosis in cervical region 05/06/2014    Priority: High    Class: Chronic  .  Cervical spondylosis without myelopathy 05/06/2014    Priority: High  . Gait disorder 07/05/2016  . HCAP (healthcare-associated pneumonia) 05/09/2014  . Acute respiratory failure with hypoxia ([facility]) 05/09/2014  . Pneumonia 05/09/2014  . Hypertension   . Hypothyroidism   . Breast cancer ([facility])   . Cervical stenosis (uterine cervix) 05/07/2014  . HNP (herniated nucleus pulposus), cervical 05/06/2014  . Adult hypothyroidism 05/03/2013  . 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 Primary malignant neoplasm of breast ([facility]) 10/24/2002   Past Medical History:  Diagnosis Date  . Arthritis   . Blood dyscrasia    bleeds and bruises easily  . Breast cancer ([facility])   . Gait disorder 07/05/2016  . Hepatitis     Hep A years ago  . Hypertension   . Hypothyroidism    Graves disease, age 29 yo  . Nose trouble    right nostril will hemorrhage at times  . Pneumonia   . Rosacea     Family History  Problem Relation Age of Onset  . Congestive Heart Failure Mother   . Heart disease Father   . Tongue cancer Grandchild     Past Surgical History:  Procedure Laterality Date  . ABDOMINAL HYSTERECTOMY     Age 59  . ANTERIOR CERVICAL DECOMP/DISCECTOMY FUSION N/A 05/06/2014   Procedure: ANTERIOR CERVICAL DISCECTOMY FUSION C3-4 with transgraft, local bone graft, plate and screws;  Surgeon: Jessy Oto, MD;  Location: [facility];  Service: Orthopedics;  Laterality: N/A;  . APPENDECTOMY    . BREAST LUMPECTOMY Left   . COLONOSCOPY    . EYE SURGERY Bilateral    cataracts  . LUMBAR SPINE SURGERY  2011   Removal of hematoma  . ORIF HUMERUS FRACTURE Left 2013  . THORACIC SPINE SURGERY  2011   Bone cages  . THYROIDECTOMY    . TONSILLECTOMY     Social History   Occupational History  . Occupation: Retired  Tobacco Use  . Smoking status: Former Smoker    Types: Cigarettes    Quit date: 04/23/1962    Years since quitting: 56.9  . Smokeless tobacco: Never Used  . Tobacco comment: as teenager   Substance and Sexual Activity  . Alcohol use: Yes    Comment: Social  . Drug use: No  . Sexual activity: Not on file

## 2019-03-28 NOTE — Patient Instructions (Signed)
Plan:  Knee is suffering from pes bursitis, left hip greater trochanteric bursitis. Well padded shoes help. Ice the knee 2-3 times a day 15-20 mins at a time. Tylenol for pain and voltaren gel applied to the knee and hip 3-4 times per day.

## 2019-04-04 DIAGNOSIS — Z8349 Family history of other endocrine, nutritional and metabolic diseases: Secondary | ICD-10-CM | POA: Diagnosis not present

## 2019-04-04 DIAGNOSIS — Z92241 Personal history of systemic steroid therapy: Secondary | ICD-10-CM | POA: Diagnosis not present

## 2019-04-04 DIAGNOSIS — Z8639 Personal history of other endocrine, nutritional and metabolic disease: Secondary | ICD-10-CM | POA: Diagnosis not present

## 2019-04-04 DIAGNOSIS — E0500 Thyrotoxicosis with diffuse goiter without thyrotoxic crisis or storm: Secondary | ICD-10-CM | POA: Diagnosis not present

## 2019-04-04 DIAGNOSIS — R5383 Other fatigue: Secondary | ICD-10-CM | POA: Diagnosis not present

## 2019-04-05 DIAGNOSIS — Z8639 Personal history of other endocrine, nutritional and metabolic disease: Secondary | ICD-10-CM | POA: Diagnosis not present

## 2019-04-05 DIAGNOSIS — R5383 Other fatigue: Secondary | ICD-10-CM | POA: Diagnosis not present

## 2019-04-05 DIAGNOSIS — E0500 Thyrotoxicosis with diffuse goiter without thyrotoxic crisis or storm: Secondary | ICD-10-CM | POA: Diagnosis not present

## 2019-04-05 DIAGNOSIS — Z92241 Personal history of systemic steroid therapy: Secondary | ICD-10-CM | POA: Diagnosis not present

## 2019-04-12 DIAGNOSIS — C7800 Secondary malignant neoplasm of unspecified lung: Secondary | ICD-10-CM | POA: Diagnosis not present

## 2019-04-12 DIAGNOSIS — Z5111 Encounter for antineoplastic chemotherapy: Secondary | ICD-10-CM | POA: Diagnosis not present

## 2019-04-12 DIAGNOSIS — C787 Secondary malignant neoplasm of liver and intrahepatic bile duct: Secondary | ICD-10-CM | POA: Diagnosis not present

## 2019-04-12 DIAGNOSIS — C50912 Malignant neoplasm of unspecified site of left female breast: Secondary | ICD-10-CM | POA: Diagnosis not present

## 2019-04-17 DIAGNOSIS — R2689 Other abnormalities of gait and mobility: Secondary | ICD-10-CM | POA: Diagnosis not present

## 2019-04-17 DIAGNOSIS — M6281 Muscle weakness (generalized): Secondary | ICD-10-CM | POA: Diagnosis not present

## 2019-05-02 DIAGNOSIS — H04123 Dry eye syndrome of bilateral lacrimal glands: Secondary | ICD-10-CM | POA: Diagnosis not present

## 2019-05-02 DIAGNOSIS — Z961 Presence of intraocular lens: Secondary | ICD-10-CM | POA: Diagnosis not present

## 2019-05-03 DIAGNOSIS — C50912 Malignant neoplasm of unspecified site of left female breast: Secondary | ICD-10-CM | POA: Diagnosis not present

## 2019-05-03 DIAGNOSIS — Z5111 Encounter for antineoplastic chemotherapy: Secondary | ICD-10-CM | POA: Diagnosis not present

## 2019-05-03 DIAGNOSIS — C787 Secondary malignant neoplasm of liver and intrahepatic bile duct: Secondary | ICD-10-CM | POA: Diagnosis not present

## 2019-05-03 DIAGNOSIS — C7800 Secondary malignant neoplasm of unspecified lung: Secondary | ICD-10-CM | POA: Diagnosis not present

## 2019-05-09 ENCOUNTER — Ambulatory Visit (INDEPENDENT_AMBULATORY_CARE_PROVIDER_SITE_OTHER): Payer: Medicare Other | Admitting: Specialist

## 2019-05-09 ENCOUNTER — Encounter: Payer: Self-pay | Admitting: Specialist

## 2019-05-09 ENCOUNTER — Ambulatory Visit (INDEPENDENT_AMBULATORY_CARE_PROVIDER_SITE_OTHER): Payer: Medicare Other

## 2019-05-09 VITALS — BP 144/65 | HR 60 | Ht 61.5 in | Wt 137.0 lb

## 2019-05-09 DIAGNOSIS — M25561 Pain in right knee: Secondary | ICD-10-CM | POA: Diagnosis not present

## 2019-05-09 DIAGNOSIS — G8929 Other chronic pain: Secondary | ICD-10-CM | POA: Diagnosis not present

## 2019-05-09 DIAGNOSIS — M24561 Contracture, right knee: Secondary | ICD-10-CM

## 2019-05-09 NOTE — Patient Instructions (Signed)
Avoid frequent bending and stooping  No lifting greater than 10 lbs. May use ice or moist heat for pain. Weight loss is of benefit. Exercise is important to improve your indurance and does allow people to function better inspite of back pain. Right knee medial gastrocnemius strain with healing with scar and contract. Recommend heat, pool exercise and stretching the right knee gastroc and hamstring.

## 2019-05-09 NOTE — Progress Notes (Signed)
Office Visit Note   Patient: Karen Allison           Date of Birth: July 21, 1946           MRN: [id number] Visit Date: 05/09/2019              Requested by: [name], MD [address]. [place],  [place] [address] PCP: [name], MD   Assessment & Plan: Visit Diagnoses:  1. Chronic pain of right knee     Plan: Avoid frequent bending and stooping  No lifting greater than 10 lbs. May use ice or moist heat for pain. Weight loss is of benefit. Exercise is important to improve your indurance and does allow people to function better inspite of back pain. Right knee medial gastrocnemius strain with healing with scar and contract. Recommend heat, pool exercise and stretching the right knee gastroc and hamstring.  Follow-Up Instructions: No follow-ups on file.   Orders:  Orders Placed This Encounter  Procedures  . XR Knee Complete 4 Views Right   No orders of the defined types were placed in this encounter.     Procedures: No procedures performed   Clinical Data: No additional findings.   Subjective: Chief Complaint  Patient presents with  . Right Knee - Pain, Follow-up  . Left Hip - Pain, Follow-up    73 year old female with low back pain but the back is doing well. She had an injury to the right knee with injection of the  Pes anserina bursa. Since the injection she has noticed pain in the right posterior medial knee and down into the right calf. She has pain with standing and ambulating. She has pain with stair climbing.    Review of Systems  Constitutional: Negative.   HENT: Negative.   Eyes: Negative.   Respiratory: Negative.   Cardiovascular: Negative.   Gastrointestinal: Negative.   Endocrine: Negative.   Genitourinary: Negative.   Musculoskeletal: Negative.   Skin: Negative.   Allergic/Immunologic: Negative.   Neurological: Negative.   Hematological: Negative.   Psychiatric/Behavioral: Negative.      Objective: Vital Signs: BP (!)  144/65   Pulse 60   Ht 5' 1.5" (1.562 m)   Wt 137 lb (62.1 kg)   BMI 25.47 kg/m   Physical Exam Constitutional:      Appearance: She is well-developed.  HENT:     Head: Normocephalic and atraumatic.  Eyes:     Pupils: Pupils are equal, round, and reactive to light.  Neck:     Musculoskeletal: Normal range of motion and neck supple.  Pulmonary:     Effort: Pulmonary effort is normal.     Breath sounds: Normal breath sounds.  Abdominal:     General: Bowel sounds are normal.     Palpations: Abdomen is soft.  Skin:    General: Skin is warm and dry.  Neurological:     Mental Status: She is alert and oriented to person, place, and time.  Psychiatric:        Behavior: Behavior normal.        Thought Content: Thought content normal.        Judgment: Judgment normal.     Right Knee Exam   Muscle Strength  The patient has normal right knee strength.  Tenderness  The patient is experiencing tenderness in the medial joint line and medial hamstring.  Range of Motion  Extension:  -5 abnormal  Flexion:  130 abnormal   Tests  McMurray:  Medial - negative Lateral - negative Varus: negative Valgus: negative Lachman:  Anterior - negative    Posterior - negative Drawer:  Anterior - negative    Posterior - negative Pivot shift: negative Patellar apprehension: negative  Other  Erythema: absent Scars: absent Sensation: normal Swelling: none      Specialty Comments:  No specialty comments available.  Imaging: No results found.   PMFS History: Patient Active Problem List   Diagnosis Date Noted  . Other forms of scoliosis, thoracolumbar region 11/03/2016    Priority: High    Class: Chronic  . Spinal stenosis in cervical region 05/06/2014    Priority: High    Class: Chronic  . Cervical spondylosis without myelopathy 05/06/2014    Priority: High  . Gait disorder 07/05/2016  . HCAP (healthcare-associated pneumonia) 05/09/2014  . Acute respiratory failure with  hypoxia ([facility]) 05/09/2014  . Pneumonia 05/09/2014  . Hypertension   . Hypothyroidism   . Breast cancer ([facility])   . Cervical stenosis (uterine cervix) 05/07/2014  . HNP (herniated nucleus pulposus), cervical 05/06/2014  . Adult hypothyroidism 05/03/2013  . 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 Primary malignant neoplasm of breast ([facility]) 10/24/2002   Past Medical History:  Diagnosis Date  . Arthritis   . Blood dyscrasia    bleeds and bruises easily  . Breast cancer ([facility])   . Gait disorder 07/05/2016  . Hepatitis     Hep A years ago  . Hypertension   . Hypothyroidism    Graves disease, age 89 yo  . Nose trouble    right nostril will hemorrhage at times  . Pneumonia   . Rosacea     Family History  Problem Relation Age of Onset  . Congestive Heart Failure Mother   . Heart disease Father   . Tongue cancer Grandchild     Past Surgical History:  Procedure Laterality Date  . ABDOMINAL HYSTERECTOMY     Age 8  . ANTERIOR CERVICAL DECOMP/DISCECTOMY FUSION N/A 05/06/2014   Procedure: ANTERIOR CERVICAL DISCECTOMY FUSION C3-4 with transgraft, local bone graft, plate and screws;  Surgeon: Jessy Oto, MD;  Location: [facility];  Service: Orthopedics;  Laterality: N/A;  . APPENDECTOMY    . BREAST LUMPECTOMY Left   . COLONOSCOPY    . EYE SURGERY Bilateral    cataracts  . LUMBAR SPINE SURGERY  2011   Removal of hematoma  . ORIF HUMERUS FRACTURE Left 2013  . THORACIC SPINE SURGERY  2011   Bone cages  . THYROIDECTOMY    . TONSILLECTOMY     Social History   Occupational History  . Occupation: Retired  Tobacco Use  . Smoking status: Former Smoker    Types: Cigarettes    Quit date: 04/23/1962    Years since quitting: 57.0  . Smokeless tobacco: Never Used  . Tobacco comment: as teenager  Substance and Sexual Activity  . Alcohol use: Yes    Comment: Social  . Drug use: No  . Sexual activity: Not on file

## 2019-05-20 DIAGNOSIS — Z1331 Encounter for screening for depression: Secondary | ICD-10-CM | POA: Diagnosis not present

## 2019-05-20 DIAGNOSIS — Z0000 Encounter for general adult medical examination without abnormal findings: Secondary | ICD-10-CM | POA: Diagnosis not present

## 2019-05-20 DIAGNOSIS — Z6825 Body mass index (BMI) 25.0-25.9, adult: Secondary | ICD-10-CM | POA: Diagnosis not present

## 2019-05-20 DIAGNOSIS — Z1339 Encounter for screening examination for other mental health and behavioral disorders: Secondary | ICD-10-CM | POA: Diagnosis not present

## 2019-05-21 ENCOUNTER — Telehealth: Payer: Self-pay | Admitting: Specialist

## 2019-05-21 NOTE — Telephone Encounter (Signed)
Patient called to request a note that states it is okay for the patient to do pool therapy.  Patient requests that the note be faxed to Mini Singh at [facility] in [place].  The fax # is [phone].  Patient would like for you to call her when the note has been sent.  [phone].   Thank you.

## 2019-05-23 ENCOUNTER — Encounter: Payer: Self-pay | Admitting: Radiology

## 2019-05-23 NOTE — Telephone Encounter (Signed)
I have faxed the note that she requested

## 2019-05-24 DIAGNOSIS — Z5111 Encounter for antineoplastic chemotherapy: Secondary | ICD-10-CM | POA: Diagnosis not present

## 2019-05-24 DIAGNOSIS — C787 Secondary malignant neoplasm of liver and intrahepatic bile duct: Secondary | ICD-10-CM | POA: Diagnosis not present

## 2019-05-24 DIAGNOSIS — C50912 Malignant neoplasm of unspecified site of left female breast: Secondary | ICD-10-CM | POA: Diagnosis not present

## 2019-05-24 DIAGNOSIS — C7800 Secondary malignant neoplasm of unspecified lung: Secondary | ICD-10-CM | POA: Diagnosis not present

## 2019-05-29 DIAGNOSIS — Z23 Encounter for immunization: Secondary | ICD-10-CM | POA: Diagnosis not present

## 2019-06-03 DIAGNOSIS — M6281 Muscle weakness (generalized): Secondary | ICD-10-CM | POA: Diagnosis not present

## 2019-06-03 DIAGNOSIS — M25561 Pain in right knee: Secondary | ICD-10-CM | POA: Diagnosis not present

## 2019-06-03 DIAGNOSIS — R2689 Other abnormalities of gait and mobility: Secondary | ICD-10-CM | POA: Diagnosis not present

## 2019-06-03 DIAGNOSIS — R2681 Unsteadiness on feet: Secondary | ICD-10-CM | POA: Diagnosis not present

## 2019-06-04 DIAGNOSIS — M6281 Muscle weakness (generalized): Secondary | ICD-10-CM | POA: Diagnosis not present

## 2019-06-04 DIAGNOSIS — R2689 Other abnormalities of gait and mobility: Secondary | ICD-10-CM | POA: Diagnosis not present

## 2019-06-04 DIAGNOSIS — M25561 Pain in right knee: Secondary | ICD-10-CM | POA: Diagnosis not present

## 2019-06-04 DIAGNOSIS — R2681 Unsteadiness on feet: Secondary | ICD-10-CM | POA: Diagnosis not present

## 2019-06-11 DIAGNOSIS — M25561 Pain in right knee: Secondary | ICD-10-CM | POA: Diagnosis not present

## 2019-06-11 DIAGNOSIS — M6281 Muscle weakness (generalized): Secondary | ICD-10-CM | POA: Diagnosis not present

## 2019-06-11 DIAGNOSIS — R2689 Other abnormalities of gait and mobility: Secondary | ICD-10-CM | POA: Diagnosis not present

## 2019-06-11 DIAGNOSIS — R2681 Unsteadiness on feet: Secondary | ICD-10-CM | POA: Diagnosis not present

## 2019-06-12 DIAGNOSIS — R2681 Unsteadiness on feet: Secondary | ICD-10-CM | POA: Diagnosis not present

## 2019-06-12 DIAGNOSIS — R2689 Other abnormalities of gait and mobility: Secondary | ICD-10-CM | POA: Diagnosis not present

## 2019-06-12 DIAGNOSIS — M6281 Muscle weakness (generalized): Secondary | ICD-10-CM | POA: Diagnosis not present

## 2019-06-12 DIAGNOSIS — M25561 Pain in right knee: Secondary | ICD-10-CM | POA: Diagnosis not present

## 2019-06-14 DIAGNOSIS — C50912 Malignant neoplasm of unspecified site of left female breast: Secondary | ICD-10-CM | POA: Diagnosis not present

## 2019-06-14 DIAGNOSIS — Z5111 Encounter for antineoplastic chemotherapy: Secondary | ICD-10-CM | POA: Diagnosis not present

## 2019-06-14 DIAGNOSIS — E1129 Type 2 diabetes mellitus with other diabetic kidney complication: Secondary | ICD-10-CM | POA: Diagnosis not present

## 2019-06-14 DIAGNOSIS — C7800 Secondary malignant neoplasm of unspecified lung: Secondary | ICD-10-CM | POA: Diagnosis not present

## 2019-06-14 DIAGNOSIS — C787 Secondary malignant neoplasm of liver and intrahepatic bile duct: Secondary | ICD-10-CM | POA: Diagnosis not present

## 2019-06-18 DIAGNOSIS — R2681 Unsteadiness on feet: Secondary | ICD-10-CM | POA: Diagnosis not present

## 2019-06-18 DIAGNOSIS — M6281 Muscle weakness (generalized): Secondary | ICD-10-CM | POA: Diagnosis not present

## 2019-06-18 DIAGNOSIS — M25561 Pain in right knee: Secondary | ICD-10-CM | POA: Diagnosis not present

## 2019-06-18 DIAGNOSIS — R2689 Other abnormalities of gait and mobility: Secondary | ICD-10-CM | POA: Diagnosis not present

## 2019-06-20 ENCOUNTER — Ambulatory Visit: Payer: Medicare Other | Admitting: Specialist

## 2019-06-21 DIAGNOSIS — M6281 Muscle weakness (generalized): Secondary | ICD-10-CM | POA: Diagnosis not present

## 2019-06-21 DIAGNOSIS — R2681 Unsteadiness on feet: Secondary | ICD-10-CM | POA: Diagnosis not present

## 2019-06-21 DIAGNOSIS — R2689 Other abnormalities of gait and mobility: Secondary | ICD-10-CM | POA: Diagnosis not present

## 2019-06-21 DIAGNOSIS — M25561 Pain in right knee: Secondary | ICD-10-CM | POA: Diagnosis not present

## 2019-06-26 DIAGNOSIS — R2681 Unsteadiness on feet: Secondary | ICD-10-CM | POA: Diagnosis not present

## 2019-06-26 DIAGNOSIS — M6281 Muscle weakness (generalized): Secondary | ICD-10-CM | POA: Diagnosis not present

## 2019-06-26 DIAGNOSIS — M25561 Pain in right knee: Secondary | ICD-10-CM | POA: Diagnosis not present

## 2019-06-26 DIAGNOSIS — R2689 Other abnormalities of gait and mobility: Secondary | ICD-10-CM | POA: Diagnosis not present

## 2019-07-05 DIAGNOSIS — C787 Secondary malignant neoplasm of liver and intrahepatic bile duct: Secondary | ICD-10-CM | POA: Diagnosis not present

## 2019-07-05 DIAGNOSIS — C7800 Secondary malignant neoplasm of unspecified lung: Secondary | ICD-10-CM | POA: Diagnosis not present

## 2019-07-05 DIAGNOSIS — C50912 Malignant neoplasm of unspecified site of left female breast: Secondary | ICD-10-CM | POA: Diagnosis not present

## 2019-07-05 DIAGNOSIS — Z5111 Encounter for antineoplastic chemotherapy: Secondary | ICD-10-CM | POA: Diagnosis not present

## 2019-07-09 DIAGNOSIS — R2681 Unsteadiness on feet: Secondary | ICD-10-CM | POA: Diagnosis not present

## 2019-07-09 DIAGNOSIS — R2689 Other abnormalities of gait and mobility: Secondary | ICD-10-CM | POA: Diagnosis not present

## 2019-07-09 DIAGNOSIS — M25561 Pain in right knee: Secondary | ICD-10-CM | POA: Diagnosis not present

## 2019-07-09 DIAGNOSIS — M6281 Muscle weakness (generalized): Secondary | ICD-10-CM | POA: Diagnosis not present

## 2019-07-10 DIAGNOSIS — Z92241 Personal history of systemic steroid therapy: Secondary | ICD-10-CM | POA: Diagnosis not present

## 2019-07-10 DIAGNOSIS — Z8349 Family history of other endocrine, nutritional and metabolic diseases: Secondary | ICD-10-CM | POA: Diagnosis not present

## 2019-07-10 DIAGNOSIS — Z8639 Personal history of other endocrine, nutritional and metabolic disease: Secondary | ICD-10-CM | POA: Diagnosis not present

## 2019-07-10 DIAGNOSIS — E0500 Thyrotoxicosis with diffuse goiter without thyrotoxic crisis or storm: Secondary | ICD-10-CM | POA: Diagnosis not present

## 2019-07-15 ENCOUNTER — Ambulatory Visit (INDEPENDENT_AMBULATORY_CARE_PROVIDER_SITE_OTHER): Payer: Medicare Other | Admitting: Specialist

## 2019-07-15 ENCOUNTER — Other Ambulatory Visit: Payer: Self-pay

## 2019-07-15 ENCOUNTER — Encounter: Payer: Self-pay | Admitting: Specialist

## 2019-07-15 VITALS — BP 154/67 | HR 69 | Ht 61.5 in | Wt 137.0 lb

## 2019-07-15 DIAGNOSIS — W19XXXD Unspecified fall, subsequent encounter: Secondary | ICD-10-CM | POA: Diagnosis not present

## 2019-07-15 DIAGNOSIS — M4322 Fusion of spine, cervical region: Secondary | ICD-10-CM

## 2019-07-15 DIAGNOSIS — R2689 Other abnormalities of gait and mobility: Secondary | ICD-10-CM | POA: Diagnosis not present

## 2019-07-15 DIAGNOSIS — M48062 Spinal stenosis, lumbar region with neurogenic claudication: Secondary | ICD-10-CM | POA: Diagnosis not present

## 2019-07-15 DIAGNOSIS — M4326 Fusion of spine, lumbar region: Secondary | ICD-10-CM

## 2019-07-15 NOTE — Progress Notes (Signed)
Office Visit Note   Patient: Karen Allison           Date of Birth: 23-Feb-1946           MRN: [id number] Visit Date: 07/15/2019              Requested by: [name], MD [address]. [place],  [place] [address] PCP: [name], MD   Assessment & Plan: Visit Diagnoses: No diagnosis found.  Plan: Avoid bending, stooping and avoid lifting weights greater than 10 lbs. Avoid prolong standing and walking. Avoid frequent bending and stooping  No lifting greater than 10 lbs. May use ice or moist heat for pain. Weight loss is of benefit. Handicap license is approved. Avoid overhead lifting and overhead use of the arms. Do not lift greater than 5 lbs. Adjust head rest in vehicle to prevent hyperextension if rear ended. Take extra precautions to avoid falling, including use of a cane if you feel weak.  Follow-Up Instructions: No follow-ups on file.   Orders:  No orders of the defined types were placed in this encounter.  No orders of the defined types were placed in this encounter.     Procedures: No procedures performed   Clinical Data: No additional findings.   Subjective: Chief Complaint  Patient presents with  . Right Knee - Pain    73 year old female with history of lumbar spinal fusion and cervical fusion for stenosis and severe DDD. She has difficulty with balance and coordination and has frequent falls and she is going to PT but even with this she had a fall backwards and fell onto the left shoulder on the wooden part of the recliner. She has had left shoulder pain down to the left mid arm level. No arm numbness or tingling. There is leg numbness from the knees down worse than from the waist downwards, she has to use a walker for ambulation. Some difficulty voiding and has to push more to void. She is concern she may not be empty. Last saw the urologist some time ago.   Review of Systems  Constitutional: Negative.  Negative for activity change,  appetite change, chills, diaphoresis, fatigue, fever and unexpected weight change.  HENT: Positive for rhinorrhea, sinus pressure and sinus pain. Negative for congestion, dental problem, drooling, ear discharge, ear pain, facial swelling, hearing loss, mouth sores, nosebleeds, postnasal drip, sneezing, sore throat, tinnitus, trouble swallowing and voice change.   Eyes: Negative.   Respiratory: Negative.   Cardiovascular: Positive for leg swelling.  Gastrointestinal: Negative.   Endocrine: Positive for cold intolerance. Negative for heat intolerance, polydipsia, polyphagia and polyuria.  Genitourinary: Positive for decreased urine volume.  Musculoskeletal: Positive for back pain (above the fusion area she gets tired.) and gait problem. Negative for arthralgias.  Allergic/Immunologic: Negative for environmental allergies, food allergies and immunocompromised state.  Neurological: Positive for weakness, numbness and headaches. Negative for dizziness, facial asymmetry and light-headedness.  Hematological: Negative for adenopathy. Does not bruise/bleed easily.  Psychiatric/Behavioral: Negative for agitation, behavioral problems, confusion, decreased concentration, dysphoric mood, hallucinations, self-injury, sleep disturbance and suicidal ideas. The patient is not nervous/anxious and is not hyperactive.      Objective: Vital Signs: BP (!) 154/67 (BP Location: Right Arm, Patient Position: Sitting)   Pulse 69   Ht 5' 1.5" (1.562 m)   Wt 137 lb (62.1 kg)   BMI 25.47 kg/m   Physical Exam  Ortho Exam  Specialty Comments:  No specialty comments available.  Imaging: No  results found.   PMFS History: Patient Active Problem List   Diagnosis Date Noted  . Other forms of scoliosis, thoracolumbar region 11/03/2016    Priority: High    Class: Chronic  . Spinal stenosis in cervical region 05/06/2014    Priority: High    Class: Chronic  . Cervical spondylosis without myelopathy 05/06/2014     Priority: High  . Gait disorder 07/05/2016  . HCAP (healthcare-associated pneumonia) 05/09/2014  . Acute respiratory failure with hypoxia ([facility]) 05/09/2014  . Pneumonia 05/09/2014  . Hypertension   . Hypothyroidism   . Breast cancer ([facility])   . Cervical stenosis (uterine cervix) 05/07/2014  . HNP (herniated nucleus pulposus), cervical 05/06/2014  . Adult hypothyroidism 05/03/2013  . 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 Primary malignant neoplasm of breast ([facility]) 10/24/2002   Past Medical History:  Diagnosis Date  . Arthritis   . Blood dyscrasia    bleeds and bruises easily  . Breast cancer ([facility])   . Gait disorder 07/05/2016  . Hepatitis     Hep A years ago  . Hypertension   . Hypothyroidism    Graves disease, age 36 yo  . Nose trouble    right nostril will hemorrhage at times  . Pneumonia   . Rosacea     Family History  Problem Relation Age of Onset  . Congestive Heart Failure Mother   . Heart disease Father   . Tongue cancer Grandchild     Past Surgical History:  Procedure Laterality Date  . ABDOMINAL HYSTERECTOMY     Age 14  . ANTERIOR CERVICAL DECOMP/DISCECTOMY FUSION N/A 05/06/2014   Procedure: ANTERIOR CERVICAL DISCECTOMY FUSION C3-4 with transgraft, local bone graft, plate and screws;  Surgeon: Jessy Oto, MD;  Location: [facility];  Service: Orthopedics;  Laterality: N/A;  . APPENDECTOMY    . BREAST LUMPECTOMY Left   . COLONOSCOPY    . EYE SURGERY Bilateral    cataracts  . LUMBAR SPINE SURGERY  2011   Removal of hematoma  . ORIF HUMERUS FRACTURE Left 2013  . THORACIC SPINE SURGERY  2011   Bone cages  . THYROIDECTOMY    . TONSILLECTOMY     Social History   Occupational History  . Occupation: Retired  Tobacco Use  . Smoking status: Former Smoker    Types: Cigarettes    Quit date: 04/23/1962    Years since quitting: 57.2  . Smokeless tobacco: Never Used  . Tobacco comment: as teenager  Substance and Sexual Activity  . Alcohol use: Yes     Comment: Social  . Drug use: No  . Sexual activity: Not on file

## 2019-07-15 NOTE — Patient Instructions (Signed)
Avoid bending, stooping and avoid lifting weights greater than 10 lbs. Avoid prolong standing and walking. Avoid frequent bending and stooping  No lifting greater than 10 lbs. May use ice or moist heat for pain. Weight loss is of benefit. Handicap license is approved. Avoid overhead lifting and overhead use of the arms. Do not lift greater than 5 lbs. Adjust head rest in vehicle to prevent hyperextension if rear ended. Take extra precautions to avoid falling, including use of a cane if you feel weak.

## 2019-07-15 NOTE — Addendum Note (Signed)
Addended by: Basil Dess on: 07/15/2019 04:40 PM   Modules accepted: Orders

## 2019-07-16 DIAGNOSIS — M25561 Pain in right knee: Secondary | ICD-10-CM | POA: Diagnosis not present

## 2019-07-16 DIAGNOSIS — R2689 Other abnormalities of gait and mobility: Secondary | ICD-10-CM | POA: Diagnosis not present

## 2019-07-16 DIAGNOSIS — R2681 Unsteadiness on feet: Secondary | ICD-10-CM | POA: Diagnosis not present

## 2019-07-16 DIAGNOSIS — M6281 Muscle weakness (generalized): Secondary | ICD-10-CM | POA: Diagnosis not present

## 2019-07-19 DIAGNOSIS — S0990XA Unspecified injury of head, initial encounter: Secondary | ICD-10-CM | POA: Diagnosis not present

## 2019-07-19 DIAGNOSIS — S299XXA Unspecified injury of thorax, initial encounter: Secondary | ICD-10-CM | POA: Diagnosis not present

## 2019-07-19 DIAGNOSIS — S42101A Fracture of unspecified part of scapula, right shoulder, initial encounter for closed fracture: Secondary | ICD-10-CM | POA: Diagnosis not present

## 2019-07-19 DIAGNOSIS — W19XXXA Unspecified fall, initial encounter: Secondary | ICD-10-CM | POA: Diagnosis not present

## 2019-07-19 DIAGNOSIS — R52 Pain, unspecified: Secondary | ICD-10-CM | POA: Diagnosis not present

## 2019-07-19 DIAGNOSIS — M542 Cervicalgia: Secondary | ICD-10-CM | POA: Diagnosis not present

## 2019-07-19 DIAGNOSIS — Z853 Personal history of malignant neoplasm of breast: Secondary | ICD-10-CM | POA: Diagnosis not present

## 2019-07-19 DIAGNOSIS — R519 Headache, unspecified: Secondary | ICD-10-CM | POA: Diagnosis not present

## 2019-07-19 DIAGNOSIS — S199XXA Unspecified injury of neck, initial encounter: Secondary | ICD-10-CM | POA: Diagnosis not present

## 2019-07-22 ENCOUNTER — Telehealth: Payer: Self-pay | Admitting: Specialist

## 2019-07-22 NOTE — Telephone Encounter (Signed)
Pt called in wanting to make an apt with dr.nitka for her back pt states she fell thanksgiving night and hurt her back badly. I told her I did not have an availability for this week with dr.nitka so I made her an appt with james for 12/2 but pt wanted me to send a message to christy to get her worked in with dr.nitka pt states dr.nitka told her to call in and ask for christy for something like this.    [phone]

## 2019-07-22 NOTE — Telephone Encounter (Signed)
I called and worked her in for 07/23/2019 @830 

## 2019-07-23 ENCOUNTER — Encounter: Payer: Self-pay | Admitting: Specialist

## 2019-07-23 ENCOUNTER — Other Ambulatory Visit: Payer: Self-pay

## 2019-07-23 ENCOUNTER — Ambulatory Visit (INDEPENDENT_AMBULATORY_CARE_PROVIDER_SITE_OTHER): Payer: Medicare Other | Admitting: Specialist

## 2019-07-23 VITALS — BP 156/65 | HR 58 | Ht 61.5 in | Wt 137.0 lb

## 2019-07-23 DIAGNOSIS — S42111A Displaced fracture of body of scapula, right shoulder, initial encounter for closed fracture: Secondary | ICD-10-CM | POA: Diagnosis not present

## 2019-07-23 DIAGNOSIS — R2689 Other abnormalities of gait and mobility: Secondary | ICD-10-CM

## 2019-07-23 DIAGNOSIS — M5416 Radiculopathy, lumbar region: Secondary | ICD-10-CM

## 2019-07-23 NOTE — Progress Notes (Signed)
Office Visit Note   Patient: Karen Allison           Date of Birth: 16-Sep-1945           MRN: [id number] Visit Date: 07/23/2019              Requested by: [name], MD [address],  [place] [address] PCP: [name], MD   Assessment & Plan: Visit Diagnoses:  1. Closed displaced fracture of body of right scapula, initial encounter   2. Chronic lumbar radiculopathy   3. Balance problem     Plan: Avoid overhead lifting and overhead use of the arms. Do not lift greater than 1 lbs. Hydrocodone 1/2- one every 6 hours for pain and inflamation.  PT in 2-3 weeks, then a home exercise program. Use gravity to assist movement of the right shoulder when bathing and to prevent stiffness. Move the right hand, fingers and elbow to prevent stiffness. Ice the right shoulder blade 30 minutes and 30 minutes off.  Lift chair would assist from sitting to standing. Fall Prevention and Home Safety Falls cause injuries and can affect all age groups. It is possible to use preventive measures to significantly decrease the likelihood of falls. There are many simple measures which can make your home safer and prevent falls. OUTDOORS  Repair cracks and edges of walkways and driveways.  Remove high doorway thresholds.  Trim shrubbery on the main path into your home.  Have good outside lighting.  Clear walkways of tools, rocks, debris, and clutter.  Check that handrails are not broken and are securely fastened. Both sides of steps should have handrails.  Have leaves, snow, and ice cleared regularly.  Use sand or salt on walkways during winter months.  In the garage, clean up grease or oil spills. BATHROOM  Install night lights.  Install grab bars by the toilet and in the tub and shower.  Use non-skid mats or decals in the tub or shower.  Place a plastic non-slip stool in the shower to sit on, if needed.  Keep floors dry and clean up all water on the floor  immediately.  Remove soap buildup in the tub or shower on a regular basis.  Secure bath mats with non-slip, double-sided rug tape.  Remove throw rugs and tripping hazards from the floors. BEDROOMS  Install night lights.  Make sure a bedside light is easy to reach.  Do not use oversized bedding.  Keep a telephone by your bedside.  Have a firm chair with side arms to use for getting dressed.  Remove throw rugs and tripping hazards from the floor. KITCHEN  Keep handles on pots and pans turned toward the center of the stove. Use back burners when possible.  Clean up spills quickly and allow time for drying.  Avoid walking on wet floors.  Avoid hot utensils and knives.  Position shelves so they are not too high or low.  Place commonly used objects within easy reach.  If necessary, use a sturdy step stool with a grab bar when reaching.  Keep electrical cables out of the way.  Do not use floor polish or wax that makes floors slippery. If you must use wax, use non-skid floor wax.  Remove throw rugs and tripping hazards from the floor. STAIRWAYS  Never leave objects on stairs.  Place handrails on both sides of stairways and use them. Fix any loose handrails. Make sure handrails on both sides of the stairways are as long  as the stairs.  Check carpeting to make sure it is firmly attached along stairs. Make repairs to worn or loose carpet promptly.  Avoid placing throw rugs at the top or bottom of stairways, or properly secure the rug with carpet tape to prevent slippage. Get rid of throw rugs, if possible.  Have an electrician put in a light switch at the top and bottom of the stairs. OTHER FALL PREVENTION TIPS  Wear low-heel or rubber-soled shoes that are supportive and fit well. Wear closed toe shoes.  When using a stepladder, make sure it is fully opened and both spreaders are firmly locked. Do not climb a closed stepladder.  Add color or contrast paint or tape to  grab bars and handrails in your home. Place contrasting color strips on first and last steps.  Learn and use mobility aids as needed. Install an electrical emergency response system.  Turn on lights to avoid dark areas. Replace light bulbs that burn out immediately. Get light switches that glow.  Arrange furniture to create clear pathways. Keep furniture in the same place.  Firmly attach carpet with non-skid or double-sided tape.  Eliminate uneven floor surfaces.  Select a carpet pattern that does not visually hide the edge of steps.  Be aware of all pets. OTHER HOME SAFETY TIPS  Set the water temperature for 120 F (48.8 C).  Keep emergency numbers on or near the telephone.  Keep smoke detectors on every level of the home and near sleeping areas. Document Released: 07/29/2002 Document Revised: 02/07/2012 Document Reviewed: 10/28/2011 [facility] Patient Information 2014 [place].   Follow-Up Instructions: No follow-ups on file.   Orders:  No orders of the defined types were placed in this encounter.  No orders of the defined types were placed in this encounter.     Procedures: No procedures performed   Clinical Data: No additional findings.   Subjective: Chief Complaint  Patient presents with  . Right Shoulder - Injury, Pain    S/p fall 07/18/2019 & 07/23/2019    HPI  Review of Systems  Constitutional: Negative for activity change, appetite change, chills, diaphoresis, fatigue, fever and unexpected weight change.  HENT: Negative.  Negative for congestion, dental problem, drooling, ear discharge, ear pain, facial swelling, hearing loss, mouth sores, nosebleeds, postnasal drip, rhinorrhea, sinus pressure, sinus pain, sneezing, sore throat, tinnitus, trouble swallowing and voice change.   Eyes: Negative for photophobia, pain, discharge, redness, itching and visual disturbance.  Respiratory: Negative.  Negative for apnea, cough, choking, chest tightness,  shortness of breath, wheezing and stridor.   Cardiovascular: Negative.  Negative for chest pain, palpitations and leg swelling.  Gastrointestinal: Negative.  Negative for abdominal distention, abdominal pain, anal bleeding, blood in stool, constipation, diarrhea, nausea, rectal pain and vomiting.  Endocrine: Negative for cold intolerance, heat intolerance, polydipsia, polyphagia and polyuria.  Genitourinary: Positive for hematuria. Negative for difficulty urinating, dyspareunia and dysuria.  Musculoskeletal: Positive for back pain and gait problem. Negative for joint swelling, myalgias, neck pain and neck stiffness.  Skin: Negative.   Neurological: Positive for weakness. Negative for tremors, seizures, syncope, speech difficulty, light-headedness, numbness and headaches.  Hematological: Negative for adenopathy. Bruises/bleeds easily.  Psychiatric/Behavioral: Negative for agitation, behavioral problems, confusion, decreased concentration, dysphoric mood, hallucinations, self-injury, sleep disturbance and suicidal ideas. The patient is not nervous/anxious and is not hyperactive.      Objective: Vital Signs: BP (!) 156/65 (BP Location: Left Arm)   Pulse (!) 58   Ht 5' 1.5" (1.562 m)  Wt 137 lb (62.1 kg)   BMI 25.47 kg/m   Physical Exam Constitutional:      Appearance: She is well-developed.  HENT:     Head: Normocephalic and atraumatic.  Eyes:     Pupils: Pupils are equal, round, and reactive to light.  Neck:     Musculoskeletal: Normal range of motion and neck supple.  Pulmonary:     Effort: Pulmonary effort is normal.     Breath sounds: Normal breath sounds.  Abdominal:     General: Bowel sounds are normal.     Palpations: Abdomen is soft.  Skin:    General: Skin is warm and dry.  Neurological:     Mental Status: She is alert and oriented to person, place, and time.  Psychiatric:        Behavior: Behavior normal.        Thought Content: Thought content normal.         Judgment: Judgment normal.     Right Shoulder Exam   Range of Motion  Active abduction: abnormal  Passive abduction: abnormal  Extension: abnormal  External rotation: abnormal  Forward flexion: abnormal  Internal rotation 0 degrees: abnormal  Internal rotation 90 degrees: abnormal   Muscle Strength  Abduction: 3/5  Internal rotation: 3/5  External rotation: 3/5  Supraspinatus: 3/5  Subscapularis: 3/5  Biceps: 3/5   Tests  Apprehension: positive Hawkins test: positive Cross arm: positive Impingement: positive Drop arm: positive  Other  Erythema: absent Scars: absent Sensation: none Pulse: present   Left Shoulder Exam   Range of Motion  Active abduction: abnormal  Passive abduction: abnormal  Extension: abnormal  External rotation: abnormal  Forward flexion: abnormal       Specialty Comments:  No specialty comments available.  Imaging: No results found.   PMFS History: Patient Active Problem List   Diagnosis Date Noted  . Other forms of scoliosis, thoracolumbar region 11/03/2016    Priority: High    Class: Chronic  . Spinal stenosis in cervical region 05/06/2014    Priority: High    Class: Chronic  . Cervical spondylosis without myelopathy 05/06/2014    Priority: High  . Gait disorder 07/05/2016  . HCAP (healthcare-associated pneumonia) 05/09/2014  . Acute respiratory failure with hypoxia ([place]) 05/09/2014  . Pneumonia 05/09/2014  . Hypertension   . Hypothyroidism   . Breast cancer ([place])   . Cervical stenosis (uterine cervix) 05/07/2014  . HNP (herniated nucleus pulposus), cervical 05/06/2014  . Adult hypothyroidism 05/03/2013  . 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 Primary malignant neoplasm of breast ([place]) 10/24/2002   Past Medical History:  Diagnosis Date  . Arthritis   . Blood dyscrasia    bleeds and bruises easily  . Breast cancer ([place])   . Gait disorder 07/05/2016  . Hepatitis     Hep A years ago  . Hypertension    . Hypothyroidism    Graves disease, age 72 yo  . Nose trouble    right nostril will hemorrhage at times  . Pneumonia   . Rosacea     Family History  Problem Relation Age of Onset  . Congestive Heart Failure Mother   . Heart disease Father   . Tongue cancer Grandchild     Past Surgical History:  Procedure Laterality Date  . ABDOMINAL HYSTERECTOMY     Age 20  . ANTERIOR CERVICAL DECOMP/DISCECTOMY FUSION N/A 05/06/2014   Procedure: ANTERIOR CERVICAL DISCECTOMY FUSION C3-4 with transgraft, local bone graft, plate and screws;  Surgeon: Jessy Oto, MD;  Location: [facility];  Service: Orthopedics;  Laterality: N/A;  . APPENDECTOMY    . BREAST LUMPECTOMY Left   . COLONOSCOPY    . EYE SURGERY Bilateral    cataracts  . LUMBAR SPINE SURGERY  2011   Removal of hematoma  . ORIF HUMERUS FRACTURE Left 2013  . THORACIC SPINE SURGERY  2011   Bone cages  . THYROIDECTOMY    . TONSILLECTOMY     Social History   Occupational History  . Occupation: Retired  Tobacco Use  . Smoking status: Former Smoker    Types: Cigarettes    Quit date: 04/23/1962    Years since quitting: 57.2  . Smokeless tobacco: Never Used  . Tobacco comment: as teenager  Substance and Sexual Activity  . Alcohol use: Yes    Comment: Social  . Drug use: No  . Sexual activity: Not on file

## 2019-07-23 NOTE — Patient Instructions (Addendum)
Plan: Avoid overhead lifting and overhead use of the arms. Do not lift greater than 1 lbs. Hydrocodone 1/2- one every 6 hours for pain and inflamation.  PT in 2-3 weeks, then a home exercise program. Use gravity to assist movement of the right shoulder when bathing and to prevent stiffness. Move the right hand, fingers and elbow to prevent stiffness. Ice the right shoulder blade 30 minutes and 30 minutes off.  Lift chair would assist from sitting to standing.   Fall Prevention and Home Safety Falls cause injuries and can affect all age groups. It is possible to use preventive measures to significantly decrease the likelihood of falls. There are many simple measures which can make your home safer and prevent falls. OUTDOORS  Repair cracks and edges of walkways and driveways.  Remove high doorway thresholds.  Trim shrubbery on the main path into your home.  Have good outside lighting.  Clear walkways of tools, rocks, debris, and clutter.  Check that handrails are not broken and are securely fastened. Both sides of steps should have handrails.  Have leaves, snow, and ice cleared regularly.  Use sand or salt on walkways during winter months.  In the garage, clean up grease or oil spills. BATHROOM  Install night lights.  Install grab bars by the toilet and in the tub and shower.  Use non-skid mats or decals in the tub or shower.  Place a plastic non-slip stool in the shower to sit on, if needed.  Keep floors dry and clean up all water on the floor immediately.  Remove soap buildup in the tub or shower on a regular basis.  Secure bath mats with non-slip, double-sided rug tape.  Remove throw rugs and tripping hazards from the floors. BEDROOMS  Install night lights.  Make sure a bedside light is easy to reach.  Do not use oversized bedding.  Keep a telephone by your bedside.  Have a firm chair with side arms to use for getting dressed.  Remove throw rugs and  tripping hazards from the floor. KITCHEN  Keep handles on pots and pans turned toward the center of the stove. Use back burners when possible.  Clean up spills quickly and allow time for drying.  Avoid walking on wet floors.  Avoid hot utensils and knives.  Position shelves so they are not too high or low.  Place commonly used objects within easy reach.  If necessary, use a sturdy step stool with a grab bar when reaching.  Keep electrical cables out of the way.  Do not use floor polish or wax that makes floors slippery. If you must use wax, use non-skid floor wax.  Remove throw rugs and tripping hazards from the floor. STAIRWAYS  Never leave objects on stairs.  Place handrails on both sides of stairways and use them. Fix any loose handrails. Make sure handrails on both sides of the stairways are as long as the stairs.  Check carpeting to make sure it is firmly attached along stairs. Make repairs to worn or loose carpet promptly.  Avoid placing throw rugs at the top or bottom of stairways, or properly secure the rug with carpet tape to prevent slippage. Get rid of throw rugs, if possible.  Have an electrician put in a light switch at the top and bottom of the stairs. OTHER FALL PREVENTION TIPS  Wear low-heel or rubber-soled shoes that are supportive and fit well. Wear closed toe shoes.  When using a stepladder, make sure it is fully opened  and both spreaders are firmly locked. Do not climb a closed stepladder.  Add color or contrast paint or tape to grab bars and handrails in your home. Place contrasting color strips on first and last steps.  Learn and use mobility aids as needed. Install an electrical emergency response system.  Turn on lights to avoid dark areas. Replace light bulbs that burn out immediately. Get light switches that glow.  Arrange furniture to create clear pathways. Keep furniture in the same place.  Firmly attach carpet with non-skid or double-sided  tape.  Eliminate uneven floor surfaces.  Select a carpet pattern that does not visually hide the edge of steps.  Be aware of all pets. OTHER HOME SAFETY TIPS  Set the water temperature for 120 F (48.8 C).  Keep emergency numbers on or near the telephone.  Keep smoke detectors on every level of the home and near sleeping areas. Document Released: 07/29/2002 Document Revised: 02/07/2012 Document Reviewed: 10/28/2011 [facility] Patient Information 2014 [place]. Call your primare care MD for an appointment to rule out cardiac or blood pressure  Or arrythmia issue that may be a cause for increase falling.

## 2019-07-24 ENCOUNTER — Ambulatory Visit: Payer: Medicare Other | Admitting: Surgery

## 2019-07-26 DIAGNOSIS — C7800 Secondary malignant neoplasm of unspecified lung: Secondary | ICD-10-CM | POA: Diagnosis not present

## 2019-07-26 DIAGNOSIS — Z5111 Encounter for antineoplastic chemotherapy: Secondary | ICD-10-CM | POA: Diagnosis not present

## 2019-07-26 DIAGNOSIS — C50912 Malignant neoplasm of unspecified site of left female breast: Secondary | ICD-10-CM | POA: Diagnosis not present

## 2019-07-26 DIAGNOSIS — C787 Secondary malignant neoplasm of liver and intrahepatic bile duct: Secondary | ICD-10-CM | POA: Diagnosis not present

## 2019-07-30 ENCOUNTER — Telehealth: Payer: Self-pay | Admitting: Specialist

## 2019-07-30 NOTE — Telephone Encounter (Signed)
Pt called in concerning some home health physical therapy she was suppose to be receiving but hasn't received a call from anyone?  Please give her a call [phone]

## 2019-07-30 NOTE — Telephone Encounter (Signed)
Can you please look at this? Patient says no one has called her, but on the order it says that "Patient has refused service". Can you please check on it.

## 2019-07-31 ENCOUNTER — Other Ambulatory Visit: Payer: Self-pay | Admitting: Specialist

## 2019-07-31 ENCOUNTER — Telehealth: Payer: Self-pay | Admitting: Specialist

## 2019-07-31 DIAGNOSIS — S42111A Displaced fracture of body of scapula, right shoulder, initial encounter for closed fracture: Secondary | ICD-10-CM

## 2019-07-31 NOTE — Telephone Encounter (Signed)
Order for PT at [facility] reordered, needs a therapist to try walker with either a post or a left forearm platform. Also balance and coordination and right shoulder pendulum exercises.

## 2019-07-31 NOTE — Telephone Encounter (Signed)
Patient wants to know if she can get the walker with an arm piece to it. ---Please advise

## 2019-07-31 NOTE — Telephone Encounter (Signed)
Patient wants to know if she can get the walker with an arm piece to it.  Was suppose to get therapy.  Please call [phone]

## 2019-08-01 NOTE — Telephone Encounter (Signed)
Order sent to [facility], they will contact pt to schedule appt

## 2019-08-04 DIAGNOSIS — Z981 Arthrodesis status: Secondary | ICD-10-CM | POA: Diagnosis not present

## 2019-08-04 DIAGNOSIS — I10 Essential (primary) hypertension: Secondary | ICD-10-CM | POA: Diagnosis not present

## 2019-08-04 DIAGNOSIS — S42111D Displaced fracture of body of scapula, right shoulder, subsequent encounter for fracture with routine healing: Secondary | ICD-10-CM | POA: Diagnosis not present

## 2019-08-04 DIAGNOSIS — R2689 Other abnormalities of gait and mobility: Secondary | ICD-10-CM | POA: Diagnosis not present

## 2019-08-04 DIAGNOSIS — R531 Weakness: Secondary | ICD-10-CM | POA: Diagnosis not present

## 2019-08-04 DIAGNOSIS — Z9049 Acquired absence of other specified parts of digestive tract: Secondary | ICD-10-CM | POA: Diagnosis not present

## 2019-08-04 DIAGNOSIS — Z90710 Acquired absence of both cervix and uterus: Secondary | ICD-10-CM | POA: Diagnosis not present

## 2019-08-04 DIAGNOSIS — Z79899 Other long term (current) drug therapy: Secondary | ICD-10-CM | POA: Diagnosis not present

## 2019-08-04 DIAGNOSIS — W19XXXD Unspecified fall, subsequent encounter: Secondary | ICD-10-CM | POA: Diagnosis not present

## 2019-08-04 DIAGNOSIS — Z87891 Personal history of nicotine dependence: Secondary | ICD-10-CM | POA: Diagnosis not present

## 2019-08-04 DIAGNOSIS — R2681 Unsteadiness on feet: Secondary | ICD-10-CM | POA: Diagnosis not present

## 2019-08-04 DIAGNOSIS — Z79891 Long term (current) use of opiate analgesic: Secondary | ICD-10-CM | POA: Diagnosis not present

## 2019-08-04 DIAGNOSIS — E039 Hypothyroidism, unspecified: Secondary | ICD-10-CM | POA: Diagnosis not present

## 2019-08-04 DIAGNOSIS — C50919 Malignant neoplasm of unspecified site of unspecified female breast: Secondary | ICD-10-CM | POA: Diagnosis not present

## 2019-08-04 DIAGNOSIS — M5416 Radiculopathy, lumbar region: Secondary | ICD-10-CM | POA: Diagnosis not present

## 2019-08-04 DIAGNOSIS — M549 Dorsalgia, unspecified: Secondary | ICD-10-CM | POA: Diagnosis not present

## 2019-08-04 DIAGNOSIS — Z9181 History of falling: Secondary | ICD-10-CM | POA: Diagnosis not present

## 2019-08-05 ENCOUNTER — Telehealth: Payer: Self-pay | Admitting: Specialist

## 2019-08-05 NOTE — Telephone Encounter (Signed)
I called and gave verbal orders for PT and for OT eval

## 2019-08-05 NOTE — Telephone Encounter (Signed)
Karen Allison with [facility] home [facility] called in requesting verbal order for home health 1 time a week for 8 weeks. Also wondering if it's okay to add on occupational therapy eval? Also needing to verify current weight bearing status.   Please give her a call [phone]

## 2019-08-07 ENCOUNTER — Encounter: Payer: Self-pay | Admitting: Surgery

## 2019-08-07 ENCOUNTER — Ambulatory Visit: Payer: Self-pay

## 2019-08-07 ENCOUNTER — Ambulatory Visit (INDEPENDENT_AMBULATORY_CARE_PROVIDER_SITE_OTHER): Payer: Medicare Other | Admitting: Surgery

## 2019-08-07 ENCOUNTER — Other Ambulatory Visit: Payer: Self-pay

## 2019-08-07 DIAGNOSIS — M79631 Pain in right forearm: Secondary | ICD-10-CM

## 2019-08-07 DIAGNOSIS — S42111A Displaced fracture of body of scapula, right shoulder, initial encounter for closed fracture: Secondary | ICD-10-CM | POA: Diagnosis not present

## 2019-08-07 DIAGNOSIS — W19XXXD Unspecified fall, subsequent encounter: Secondary | ICD-10-CM | POA: Diagnosis not present

## 2019-08-08 ENCOUNTER — Other Ambulatory Visit: Payer: Self-pay | Admitting: Oncology

## 2019-08-08 DIAGNOSIS — M8589 Other specified disorders of bone density and structure, multiple sites: Secondary | ICD-10-CM

## 2019-08-12 DIAGNOSIS — M5416 Radiculopathy, lumbar region: Secondary | ICD-10-CM | POA: Diagnosis not present

## 2019-08-12 DIAGNOSIS — S42111D Displaced fracture of body of scapula, right shoulder, subsequent encounter for fracture with routine healing: Secondary | ICD-10-CM | POA: Diagnosis not present

## 2019-08-12 DIAGNOSIS — E039 Hypothyroidism, unspecified: Secondary | ICD-10-CM | POA: Diagnosis not present

## 2019-08-12 DIAGNOSIS — C50919 Malignant neoplasm of unspecified site of unspecified female breast: Secondary | ICD-10-CM | POA: Diagnosis not present

## 2019-08-12 DIAGNOSIS — I10 Essential (primary) hypertension: Secondary | ICD-10-CM | POA: Diagnosis not present

## 2019-08-12 DIAGNOSIS — R2689 Other abnormalities of gait and mobility: Secondary | ICD-10-CM | POA: Diagnosis not present

## 2019-08-14 ENCOUNTER — Telehealth: Payer: Self-pay

## 2019-08-14 NOTE — Telephone Encounter (Signed)
I called and gave verbal auth for the 1 x a week for 2 weeks

## 2019-08-14 NOTE — Telephone Encounter (Signed)
Ronalee Belts, OT with [facility] would like plan of care approval for 1 x week for 2 weeks for patient.  Cb# [phone].  Please advise.  Thank you.

## 2019-08-15 DIAGNOSIS — C50919 Malignant neoplasm of unspecified site of unspecified female breast: Secondary | ICD-10-CM | POA: Diagnosis not present

## 2019-08-15 DIAGNOSIS — R2689 Other abnormalities of gait and mobility: Secondary | ICD-10-CM | POA: Diagnosis not present

## 2019-08-15 DIAGNOSIS — S42111D Displaced fracture of body of scapula, right shoulder, subsequent encounter for fracture with routine healing: Secondary | ICD-10-CM | POA: Diagnosis not present

## 2019-08-15 DIAGNOSIS — E039 Hypothyroidism, unspecified: Secondary | ICD-10-CM | POA: Diagnosis not present

## 2019-08-15 DIAGNOSIS — M5416 Radiculopathy, lumbar region: Secondary | ICD-10-CM | POA: Diagnosis not present

## 2019-08-15 DIAGNOSIS — I10 Essential (primary) hypertension: Secondary | ICD-10-CM | POA: Diagnosis not present

## 2019-08-21 ENCOUNTER — Encounter: Payer: Self-pay | Admitting: Specialist

## 2019-08-21 ENCOUNTER — Other Ambulatory Visit: Payer: Self-pay

## 2019-08-21 ENCOUNTER — Ambulatory Visit (INDEPENDENT_AMBULATORY_CARE_PROVIDER_SITE_OTHER): Payer: Medicare Other | Admitting: Specialist

## 2019-08-21 ENCOUNTER — Ambulatory Visit: Payer: Self-pay

## 2019-08-21 VITALS — BP 155/67 | HR 56 | Ht 61.5 in | Wt 137.0 lb

## 2019-08-21 DIAGNOSIS — S42111A Displaced fracture of body of scapula, right shoulder, initial encounter for closed fracture: Secondary | ICD-10-CM | POA: Diagnosis not present

## 2019-08-21 DIAGNOSIS — M19112 Post-traumatic osteoarthritis, left shoulder: Secondary | ICD-10-CM

## 2019-08-21 DIAGNOSIS — M7522 Bicipital tendinitis, left shoulder: Secondary | ICD-10-CM | POA: Diagnosis not present

## 2019-08-21 MED ORDER — BUPIVACAINE HCL 0.25 % IJ SOLN
4.0000 mL | INTRAMUSCULAR | Status: AC | PRN
  Administered 2019-08-21: 4 mL via INTRA_ARTICULAR

## 2019-08-21 MED ORDER — METHYLPREDNISOLONE ACETATE 40 MG/ML IJ SUSP
40.0000 mg | INTRAMUSCULAR | Status: AC | PRN
  Administered 2019-08-21: 40 mg via INTRA_ARTICULAR

## 2019-08-21 NOTE — Patient Instructions (Signed)
Avoid overhead lifting and overhead use of the arms. Do not lift greater than 1 lbs. Tylenol ES one every 6-8 hours for pain and inflamation.  Continue with PT for another 2-3 weeks, then a home exercise program. Right shoulder pendulum exercises, may do active assisted ROM right shoulder.

## 2019-08-21 NOTE — Progress Notes (Signed)
Office Visit Note   Patient: Karen Allison           Date of Birth: 1946/04/12           MRN: [id number] Visit Date: 08/21/2019              Requested by: [name], MD [address] [place],  [place] [address] PCP: [name], MD   Assessment & Plan: Visit Diagnoses:  1. Closed displaced fracture of body of right scapula, initial encounter   2. Bicipital tendonitis of shoulder, left   3. Post-traumatic osteoarthritis, left shoulder     Plan: Avoid overhead lifting and overhead use of the arms. Do not lift greater than 1 lbs. Tylenol ES one every 6-8 hours for pain and inflamation.  Continue with PT for another 2-3 weeks, then a home exercise program. Right shoulder pendulum exercises, may do active assisted ROM right shoulder.  Follow-Up Instructions: Return in about 3 weeks (around 09/11/2019).   Orders:  Orders Placed This Encounter  Procedures  . Large Joint Inj  . XR Scapula Right   No orders of the defined types were placed in this encounter.     Procedures: Large Joint Inj: L glenohumeral on 08/21/2019 3:50 PM Indications: pain Details: 25 G 1.5 in needle, anterior approach  Arthrogram: No  Medications: 40 mg methylPREDNISolone acetate 40 MG/ML; 4 mL bupivacaine 0.25 % Outcome: tolerated well, no immediate complications Procedure, treatment alternatives, risks and benefits explained, specific risks discussed. Consent was given by the patient. Immediately prior to procedure a time out was called to verify the correct patient, procedure, equipment, support staff and site/side marked as required. Patient was prepped and draped in the usual sterile fashion.       Clinical Data: No additional findings.   Subjective: Chief Complaint  Patient presents with  . Right Shoulder - Follow-up, Fracture    73 year old right handed female 4 weeks post right scapula fracture. She has been using a shoulder immoblizer and  And is having therapy to  work on pendulum exercises. Saw Mack Guise PA-C, 2 weeks ago.   Review of Systems  Constitutional: Negative.   HENT: Negative.   Eyes: Negative.   Respiratory: Negative.   Cardiovascular: Negative.   Gastrointestinal: Negative.   Endocrine: Negative.   Genitourinary: Negative.   Musculoskeletal: Negative.   Skin: Negative.   Allergic/Immunologic: Negative.   Neurological: Negative.   Hematological: Negative.   Psychiatric/Behavioral: Negative.      Objective: Vital Signs: BP (!) 155/67 (BP Location: Left Arm, Patient Position: Sitting)   Pulse (!) 56   Ht 5' 1.5" (1.562 m)   Wt 137 lb (62.1 kg)   BMI 25.47 kg/m   Physical Exam Constitutional:      Appearance: She is well-developed.  HENT:     Head: Normocephalic and atraumatic.  Eyes:     Pupils: Pupils are equal, round, and reactive to light.  Pulmonary:     Effort: Pulmonary effort is normal.     Breath sounds: Normal breath sounds.  Abdominal:     General: Bowel sounds are normal.     Palpations: Abdomen is soft.  Musculoskeletal:     Cervical back: Normal range of motion and neck supple.  Skin:    General: Skin is warm and dry.  Neurological:     Mental Status: She is alert and oriented to person, place, and time.  Psychiatric:        Behavior: Behavior normal.  Thought Content: Thought content normal.        Judgment: Judgment normal.     Right Shoulder Exam   Tenderness  The patient is experiencing no tenderness.  Range of Motion  Active abduction: abnormal  Passive abduction: abnormal  Extension: abnormal  External rotation: abnormal  Forward flexion: abnormal  Internal rotation 0 degrees: abnormal  Internal rotation 90 degrees: abnormal   Muscle Strength  Abduction: 4/5  Internal rotation: 4/5  External rotation: 4/5  Supraspinatus: 4/5  Subscapularis: 4/5  Biceps: 4/5   Tests  Apprehension: positive Hawkins test: positive Cross arm: positive Impingement: positive Drop  arm: positive Sulcus: present  Other  Erythema: absent Scars: absent Sensation: normal Pulse: present   Left Shoulder Exam   Tenderness  The patient is experiencing tenderness in the acromion.  Range of Motion  Active abduction:  80 abnormal  Passive abduction: 130  Extension: 30  External rotation: 60  Forward flexion: 80   Muscle Strength  Abduction: 3/5  Internal rotation: 3/5  External rotation: 3/5  Supraspinatus: 4/5  Subscapularis: 4/5  Biceps: 4/5   Tests  Apprehension: positive Cross arm: negative Impingement: negative Drop arm: negative Sulcus: absent  Other  Erythema: absent Scars: absent Sensation: normal Pulse: present       Specialty Comments:  No specialty comments available.  Imaging: No results found.   PMFS History: Patient Active Problem List   Diagnosis Date Noted  . Other forms of scoliosis, thoracolumbar region 11/03/2016    Priority: High    Class: Chronic  . Spinal stenosis in cervical region 05/06/2014    Priority: High    Class: Chronic  . Cervical spondylosis without myelopathy 05/06/2014    Priority: High  . Gait disorder 07/05/2016  . HCAP (healthcare-associated pneumonia) 05/09/2014  . Acute respiratory failure with hypoxia ([facility]) 05/09/2014  . Pneumonia 05/09/2014  . Hypertension   . Hypothyroidism   . Breast cancer ([facility])   . Cervical stenosis (uterine cervix) 05/07/2014  . HNP (herniated nucleus pulposus), cervical 05/06/2014  . Adult hypothyroidism 05/03/2013  . 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 Primary malignant neoplasm of breast ([facility]) 10/24/2002   Past Medical History:  Diagnosis Date  . Arthritis   . Blood dyscrasia    bleeds and bruises easily  . Breast cancer ([facility])   . Gait disorder 07/05/2016  . Hepatitis     Hep A years ago  . Hypertension   . Hypothyroidism    Graves disease, age 72 yo  . Nose trouble    right nostril will hemorrhage at times  . Pneumonia   . Rosacea       Family History  Problem Relation Age of Onset  . Congestive Heart Failure Mother   . Heart disease Father   . Tongue cancer Grandchild     Past Surgical History:  Procedure Laterality Date  . ABDOMINAL HYSTERECTOMY     Age 32  . ANTERIOR CERVICAL DECOMP/DISCECTOMY FUSION N/A 05/06/2014   Procedure: ANTERIOR CERVICAL DISCECTOMY FUSION C3-4 with transgraft, local bone graft, plate and screws;  Surgeon: Jessy Oto, MD;  Location: [facility];  Service: Orthopedics;  Laterality: N/A;  . APPENDECTOMY    . BREAST LUMPECTOMY Left   . COLONOSCOPY    . EYE SURGERY Bilateral    cataracts  . LUMBAR SPINE SURGERY  2011   Removal of hematoma  . ORIF HUMERUS FRACTURE Left 2013  . THORACIC SPINE SURGERY  2011   Bone cages  . THYROIDECTOMY    .  TONSILLECTOMY     Social History   Occupational History  . Occupation: Retired  Tobacco Use  . Smoking status: Former Smoker    Types: Cigarettes    Quit date: 04/23/1962    Years since quitting: 57.3  . Smokeless tobacco: Never Used  . Tobacco comment: as teenager  Substance and Sexual Activity  . Alcohol use: Yes    Comment: Social  . Drug use: No  . Sexual activity: Not on file

## 2019-08-22 ENCOUNTER — Ambulatory Visit: Payer: Medicare Other | Admitting: Specialist

## 2019-08-22 DIAGNOSIS — C50919 Malignant neoplasm of unspecified site of unspecified female breast: Secondary | ICD-10-CM | POA: Diagnosis not present

## 2019-08-22 DIAGNOSIS — M5416 Radiculopathy, lumbar region: Secondary | ICD-10-CM | POA: Diagnosis not present

## 2019-08-22 DIAGNOSIS — E039 Hypothyroidism, unspecified: Secondary | ICD-10-CM | POA: Diagnosis not present

## 2019-08-22 DIAGNOSIS — I10 Essential (primary) hypertension: Secondary | ICD-10-CM | POA: Diagnosis not present

## 2019-08-22 DIAGNOSIS — S42111D Displaced fracture of body of scapula, right shoulder, subsequent encounter for fracture with routine healing: Secondary | ICD-10-CM | POA: Diagnosis not present

## 2019-08-22 DIAGNOSIS — R2689 Other abnormalities of gait and mobility: Secondary | ICD-10-CM | POA: Diagnosis not present

## 2019-08-23 DIAGNOSIS — I10 Essential (primary) hypertension: Secondary | ICD-10-CM | POA: Diagnosis not present

## 2019-08-23 DIAGNOSIS — E039 Hypothyroidism, unspecified: Secondary | ICD-10-CM | POA: Diagnosis not present

## 2019-08-23 DIAGNOSIS — C50919 Malignant neoplasm of unspecified site of unspecified female breast: Secondary | ICD-10-CM | POA: Diagnosis not present

## 2019-08-23 DIAGNOSIS — S42111D Displaced fracture of body of scapula, right shoulder, subsequent encounter for fracture with routine healing: Secondary | ICD-10-CM | POA: Diagnosis not present

## 2019-08-23 DIAGNOSIS — G4730 Sleep apnea, unspecified: Secondary | ICD-10-CM

## 2019-08-23 DIAGNOSIS — R2689 Other abnormalities of gait and mobility: Secondary | ICD-10-CM | POA: Diagnosis not present

## 2019-08-23 DIAGNOSIS — M5416 Radiculopathy, lumbar region: Secondary | ICD-10-CM | POA: Diagnosis not present

## 2019-08-23 HISTORY — DX: Sleep apnea, unspecified: G47.30

## 2019-08-26 ENCOUNTER — Telehealth: Payer: Self-pay | Admitting: Specialist

## 2019-08-26 ENCOUNTER — Other Ambulatory Visit: Payer: Self-pay | Admitting: Oncology

## 2019-08-26 DIAGNOSIS — S42111D Displaced fracture of body of scapula, right shoulder, subsequent encounter for fracture with routine healing: Secondary | ICD-10-CM | POA: Diagnosis not present

## 2019-08-26 DIAGNOSIS — M5416 Radiculopathy, lumbar region: Secondary | ICD-10-CM | POA: Diagnosis not present

## 2019-08-26 DIAGNOSIS — R2689 Other abnormalities of gait and mobility: Secondary | ICD-10-CM | POA: Diagnosis not present

## 2019-08-26 DIAGNOSIS — C50919 Malignant neoplasm of unspecified site of unspecified female breast: Secondary | ICD-10-CM | POA: Diagnosis not present

## 2019-08-26 DIAGNOSIS — I10 Essential (primary) hypertension: Secondary | ICD-10-CM | POA: Diagnosis not present

## 2019-08-26 DIAGNOSIS — Z1231 Encounter for screening mammogram for malignant neoplasm of breast: Secondary | ICD-10-CM

## 2019-08-26 DIAGNOSIS — E039 Hypothyroidism, unspecified: Secondary | ICD-10-CM | POA: Diagnosis not present

## 2019-08-26 NOTE — Telephone Encounter (Signed)
Ronalee Belts from [facility] OT, called. He would like to speak with Dr. Louanne Skye or [place]. Is call back number is [phone]. He also would like for you to know that she fell.

## 2019-08-27 NOTE — Telephone Encounter (Signed)
I spoke with Karen Allison and reassured him, she is nearly 3 weeks out from a comminuted right scapula body fracture. She is  Not a surgical candidate and I recommend active assisted ROM and pendlum exercises. She can go without the sling as much as tolerated. Expect this with heal without intervention.

## 2019-08-27 NOTE — Telephone Encounter (Signed)
I called and spoke with Ronalee Belts, he states that he prefers to speak with Dr. Louanne Skye since he has several concerns regarding patient since she has had multiple falls. His call back number is [phone]

## 2019-08-29 DIAGNOSIS — I10 Essential (primary) hypertension: Secondary | ICD-10-CM | POA: Diagnosis not present

## 2019-08-29 DIAGNOSIS — E039 Hypothyroidism, unspecified: Secondary | ICD-10-CM | POA: Diagnosis not present

## 2019-08-29 DIAGNOSIS — S42111D Displaced fracture of body of scapula, right shoulder, subsequent encounter for fracture with routine healing: Secondary | ICD-10-CM | POA: Diagnosis not present

## 2019-08-29 DIAGNOSIS — M5416 Radiculopathy, lumbar region: Secondary | ICD-10-CM | POA: Diagnosis not present

## 2019-08-29 DIAGNOSIS — R2689 Other abnormalities of gait and mobility: Secondary | ICD-10-CM | POA: Diagnosis not present

## 2019-08-29 DIAGNOSIS — C50919 Malignant neoplasm of unspecified site of unspecified female breast: Secondary | ICD-10-CM | POA: Diagnosis not present

## 2019-09-02 DIAGNOSIS — S42111D Displaced fracture of body of scapula, right shoulder, subsequent encounter for fracture with routine healing: Secondary | ICD-10-CM | POA: Diagnosis not present

## 2019-09-02 DIAGNOSIS — E039 Hypothyroidism, unspecified: Secondary | ICD-10-CM | POA: Diagnosis not present

## 2019-09-02 DIAGNOSIS — R2689 Other abnormalities of gait and mobility: Secondary | ICD-10-CM | POA: Diagnosis not present

## 2019-09-02 DIAGNOSIS — M5416 Radiculopathy, lumbar region: Secondary | ICD-10-CM | POA: Diagnosis not present

## 2019-09-02 DIAGNOSIS — I10 Essential (primary) hypertension: Secondary | ICD-10-CM | POA: Diagnosis not present

## 2019-09-02 DIAGNOSIS — C50919 Malignant neoplasm of unspecified site of unspecified female breast: Secondary | ICD-10-CM | POA: Diagnosis not present

## 2019-09-03 ENCOUNTER — Telehealth: Payer: Self-pay

## 2019-09-03 DIAGNOSIS — R2689 Other abnormalities of gait and mobility: Secondary | ICD-10-CM | POA: Diagnosis not present

## 2019-09-03 DIAGNOSIS — Z90710 Acquired absence of both cervix and uterus: Secondary | ICD-10-CM | POA: Diagnosis not present

## 2019-09-03 DIAGNOSIS — Z79891 Long term (current) use of opiate analgesic: Secondary | ICD-10-CM | POA: Diagnosis not present

## 2019-09-03 DIAGNOSIS — Z981 Arthrodesis status: Secondary | ICD-10-CM | POA: Diagnosis not present

## 2019-09-03 DIAGNOSIS — M549 Dorsalgia, unspecified: Secondary | ICD-10-CM | POA: Diagnosis not present

## 2019-09-03 DIAGNOSIS — S42111D Displaced fracture of body of scapula, right shoulder, subsequent encounter for fracture with routine healing: Secondary | ICD-10-CM | POA: Diagnosis not present

## 2019-09-03 DIAGNOSIS — R2681 Unsteadiness on feet: Secondary | ICD-10-CM | POA: Diagnosis not present

## 2019-09-03 DIAGNOSIS — I10 Essential (primary) hypertension: Secondary | ICD-10-CM | POA: Diagnosis not present

## 2019-09-03 DIAGNOSIS — Z9049 Acquired absence of other specified parts of digestive tract: Secondary | ICD-10-CM | POA: Diagnosis not present

## 2019-09-03 DIAGNOSIS — Z87891 Personal history of nicotine dependence: Secondary | ICD-10-CM | POA: Diagnosis not present

## 2019-09-03 DIAGNOSIS — R531 Weakness: Secondary | ICD-10-CM | POA: Diagnosis not present

## 2019-09-03 DIAGNOSIS — M5416 Radiculopathy, lumbar region: Secondary | ICD-10-CM | POA: Diagnosis not present

## 2019-09-03 DIAGNOSIS — C50919 Malignant neoplasm of unspecified site of unspecified female breast: Secondary | ICD-10-CM | POA: Diagnosis not present

## 2019-09-03 DIAGNOSIS — Z79899 Other long term (current) drug therapy: Secondary | ICD-10-CM | POA: Diagnosis not present

## 2019-09-03 DIAGNOSIS — E039 Hypothyroidism, unspecified: Secondary | ICD-10-CM | POA: Diagnosis not present

## 2019-09-03 DIAGNOSIS — Z9181 History of falling: Secondary | ICD-10-CM | POA: Diagnosis not present

## 2019-09-03 DIAGNOSIS — W19XXXD Unspecified fall, subsequent encounter: Secondary | ICD-10-CM | POA: Diagnosis not present

## 2019-09-03 NOTE — Telephone Encounter (Signed)
Christa, PT with [facility] called stating that patient requested to be discharged from physical therapy, but her OT will continue for Left Humerus.  Christa stated that patient is still a Fall Risk.  CB# [phone].  Please advise.  Thank you.

## 2019-09-03 NOTE — Telephone Encounter (Signed)
Christa, PT with [facility] called stating that patient requested to be discharged from physical therapy, but her OT will continue for Left Humerus.  Christa stated that patient is still a Fall Risk.  CB# [phone].  Please advise.  Thank you.

## 2019-09-05 DIAGNOSIS — S42111D Displaced fracture of body of scapula, right shoulder, subsequent encounter for fracture with routine healing: Secondary | ICD-10-CM | POA: Diagnosis not present

## 2019-09-05 DIAGNOSIS — I10 Essential (primary) hypertension: Secondary | ICD-10-CM | POA: Diagnosis not present

## 2019-09-05 DIAGNOSIS — M5416 Radiculopathy, lumbar region: Secondary | ICD-10-CM | POA: Diagnosis not present

## 2019-09-05 DIAGNOSIS — E039 Hypothyroidism, unspecified: Secondary | ICD-10-CM | POA: Diagnosis not present

## 2019-09-05 DIAGNOSIS — C50919 Malignant neoplasm of unspecified site of unspecified female breast: Secondary | ICD-10-CM | POA: Diagnosis not present

## 2019-09-05 DIAGNOSIS — R2689 Other abnormalities of gait and mobility: Secondary | ICD-10-CM | POA: Diagnosis not present

## 2019-09-09 ENCOUNTER — Telehealth: Payer: Self-pay | Admitting: Specialist

## 2019-09-09 DIAGNOSIS — S42111D Displaced fracture of body of scapula, right shoulder, subsequent encounter for fracture with routine healing: Secondary | ICD-10-CM | POA: Diagnosis not present

## 2019-09-09 DIAGNOSIS — I10 Essential (primary) hypertension: Secondary | ICD-10-CM | POA: Diagnosis not present

## 2019-09-09 DIAGNOSIS — C50919 Malignant neoplasm of unspecified site of unspecified female breast: Secondary | ICD-10-CM | POA: Diagnosis not present

## 2019-09-09 DIAGNOSIS — R2689 Other abnormalities of gait and mobility: Secondary | ICD-10-CM | POA: Diagnosis not present

## 2019-09-09 DIAGNOSIS — M5416 Radiculopathy, lumbar region: Secondary | ICD-10-CM | POA: Diagnosis not present

## 2019-09-09 DIAGNOSIS — E039 Hypothyroidism, unspecified: Secondary | ICD-10-CM | POA: Diagnosis not present

## 2019-09-09 NOTE — Telephone Encounter (Signed)
[name] from [facility] called. She would like to know the status of the orders that was faxed to Dr. Louanne Skye. Her call back number is [phone]

## 2019-09-10 NOTE — Telephone Encounter (Signed)
I have forms on my desk

## 2019-09-11 NOTE — Telephone Encounter (Signed)
Tammy faxed back to them

## 2019-09-12 ENCOUNTER — Ambulatory Visit (INDEPENDENT_AMBULATORY_CARE_PROVIDER_SITE_OTHER): Payer: Medicare Other

## 2019-09-12 ENCOUNTER — Other Ambulatory Visit: Payer: Self-pay

## 2019-09-12 ENCOUNTER — Encounter: Payer: Self-pay | Admitting: Specialist

## 2019-09-12 ENCOUNTER — Ambulatory Visit (INDEPENDENT_AMBULATORY_CARE_PROVIDER_SITE_OTHER): Payer: Medicare Other | Admitting: Specialist

## 2019-09-12 VITALS — BP 140/62 | HR 54 | Ht 61.5 in | Wt 137.0 lb

## 2019-09-12 DIAGNOSIS — S42111A Displaced fracture of body of scapula, right shoulder, initial encounter for closed fracture: Secondary | ICD-10-CM

## 2019-09-12 DIAGNOSIS — M19012 Primary osteoarthritis, left shoulder: Secondary | ICD-10-CM

## 2019-09-12 DIAGNOSIS — M19011 Primary osteoarthritis, right shoulder: Secondary | ICD-10-CM | POA: Diagnosis not present

## 2019-09-12 NOTE — Patient Instructions (Signed)
Plan: Avoid overhead lifting and overhead use of the arms. Do not lift greater than 1 lbs. Hydrocodone 1/2- one every 6 hours for pain and inflamation.  PT in 2-3 weeks, then a home exercise program. Use gravity to assist movement of the right shoulder when bathing and to prevent stiffness. Move the right hand, fingers and elbow to prevent stiffness. Ice the right shoulder blade 30 minutes and 30 minutes off.  Lift chair would assist from sitting to standing. Fall Prevention and Home Safety Falls cause injuries and can affect all age groups. It is possible to use preventive measures to significantly decrease the likelihood of falls. There are many simple measures which can make your home safer and prevent falls. OUTDOORS  Repair cracks and edges of walkways and driveways.  Remove high doorway thresholds.  Trim shrubbery on the main path into your home.  Have good outside lighting.  Clear walkways of tools, rocks, debris, and clutter.  Check that handrails are not broken and are securely fastened. Both sides of steps should have handrails.  Have leaves, snow, and ice cleared regularly.  Use sand or salt on walkways during winter months.  In the garage, clean up grease or oil spills. BATHROOM  Install night lights.  Install grab bars by the toilet and in the tub and shower.  Use non-skid mats or decals in the tub or shower.  Place a plastic non-slip stool in the shower to sit on, if needed.  Keep floors dry and clean up all water on the floor immediately.  Remove soap buildup in the tub or shower on a regular basis.  Secure bath mats with non-slip, double-sided rug tape.  Remove throw rugs and tripping hazards from the floors. BEDROOMS  Install night lights.  Make sure a bedside light is easy to reach.  Do notuse oversized bedding.  Keep a telephone by your bedside.  Have a firm chair with side arms to use for getting dressed.  Remove throw rugs and tripping  hazards from the floor. KITCHEN  Keep handles on pots and pans turned toward the center of the stove. Use back burners when possible.  Clean up spills quickly and allow time for drying.  Avoid walking on wet floors.  Avoid hot utensils and knives.  Position shelves so they are not too high or low.  Place commonly used objects within easy reach.  If necessary, use a sturdy step stool with a grab bar when reaching.  Keep electrical cables out of the way.  Do notuse floor polish or wax that makes floors slippery. If you must use wax, use non-skid floor wax.  Remove throw rugs and tripping hazards from the floor. STAIRWAYS  Never leave objects on stairs.  Place handrails on both sides of stairways and use them. Fix any loose handrails. Make sure handrails on both sides of the stairways are as long as the stairs.  Check carpeting to make sure it is firmly attached along stairs. Make repairs to worn or loose carpet promptly.  Avoid placing throw rugs at the top or bottom of stairways, or properly secure the rug with carpet tape to prevent slippage. Get rid of throw rugs, if possible.  Have an electrician put in a light switch at the top and bottom of the stairs. OTHER FALL PREVENTION TIPS  Wear low-heel or rubber-soled shoes that are supportive and fit well. Wear closed toe shoes.  When using a stepladder, make sure it is fully opened and both spreaders are  firmly locked. Do notclimb a closed stepladder.  Add color or contrast paint or tape to grab bars and handrails in your home. Place contrasting color strips on first and last steps.  Learn and use mobility aids as needed. Install an electrical emergency response system.  Turn on lights to avoid dark areas. Replace light bulbs that burn out immediately. Get light switches that glow.  Arrange furniture to create clear pathways. Keep furniture in the same place.  Firmly attach carpet with non-skid or double-sided  tape.  Eliminate uneven floor surfaces.  Select a carpet pattern that does not visually hide the edge of steps.  Be aware of all pets. OTHER HOME SAFETY TIPS  Set the water temperature for 120 F (48.8 C).  Keep emergency numbers on or near the telephone.  Keep smoke detectors on every level of the home and near sleeping areas. Document Released: 07/29/2002 Document Revised: 02/07/2012 Document Reviewed: 10/28/2011 [facility] Patient Information 2014 [place].

## 2019-09-12 NOTE — Progress Notes (Signed)
Office Visit Note   Patient: Karen Allison           Date of Birth: [birth year]           MRN: [id number] Visit Date: 09/12/2019              Requested by: [name], MD [address]. [place],  [place] [address] PCP: [name], MD   Assessment & Plan: Visit Diagnoses:  1. Closed displaced fracture of body of right scapula, initial encounter   2. Primary osteoarthritis of shoulders, bilateral     Plan: Plan: Avoid overhead lifting and overhead use of the arms. Do not lift greater than 1 lbs. Hydrocodone 1/2- one every 6 hours for pain and inflamation.  PT in 2-3 weeks, then a home exercise program. Use gravity to assist movement of the right shoulder when bathing and to prevent stiffness. Move the right hand, fingers and elbow to prevent stiffness. Ice the right shoulder blade 30 minutes and 30 minutes off.  Lift chair would assist from sitting to standing. Fall Prevention and Home Safety Falls cause injuries and can affect all age groups. It is possible to use preventive measures to significantly decrease the likelihood of falls. There are many simple measures which can make your home safer and prevent falls. OUTDOORS  Repair cracks and edges of walkways and driveways.  Remove high doorway thresholds.  Trim shrubbery on the main path into your home.  Have good outside lighting.  Clear walkways of tools, rocks, debris, and clutter.  Check that handrails are not broken and are securely fastened. Both sides of steps should have handrails.  Have leaves, snow, and ice cleared regularly.  Use sand or salt on walkways during winter months.  In the garage, clean up grease or oil spills. BATHROOM  Install night lights.  Install grab bars by the toilet and in the tub and shower.  Use non-skid mats or decals in the tub or shower.  Place a plastic non-slip stool in the shower to sit on, if needed.  Keep floors dry and clean up all water on the floor  immediately.  Remove soap buildup in the tub or shower on a regular basis.  Secure bath mats with non-slip, double-sided rug tape.  Remove throw rugs and tripping hazards from the floors. BEDROOMS  Install night lights.  Make sure a bedside light is easy to reach.  Do notuse oversized bedding.  Keep a telephone by your bedside.  Have a firm chair with side arms to use for getting dressed.  Remove throw rugs and tripping hazards from the floor. KITCHEN  Keep handles on pots and pans turned toward the center of the stove. Use back burners when possible.  Clean up spills quickly and allow time for drying.  Avoid walking on wet floors.  Avoid hot utensils and knives.  Position shelves so they are not too high or low.  Place commonly used objects within easy reach.  If necessary, use a sturdy step stool with a grab bar when reaching.  Keep electrical cables out of the way.  Do notuse floor polish or wax that makes floors slippery. If you must use wax, use non-skid floor wax.  Remove throw rugs and tripping hazards from the floor. STAIRWAYS  Never leave objects on stairs.  Place handrails on both sides of stairways and use them. Fix any loose handrails. Make sure handrails on both sides of the stairways are as long as the stairs.  Check carpeting to make sure it is firmly attached along stairs. Make repairs to worn or loose carpet promptly.  Avoid placing throw rugs at the top or bottom of stairways, or properly secure the rug with carpet tape to prevent slippage. Get rid of throw rugs, if possible.  Have an electrician put in a light switch at the top and bottom of the stairs. OTHER FALL PREVENTION TIPS  Wear low-heel or rubber-soled shoes that are supportive and fit well. Wear closed toe shoes.  When using a stepladder, make sure it is fully opened and both spreaders are firmly locked. Do notclimb a closed stepladder.  Add color or contrast paint or tape to  grab bars and handrails in your home. Place contrasting color strips on first and last steps.  Learn and use mobility aids as needed. Install an electrical emergency response system.  Turn on lights to avoid dark areas. Replace light bulbs that burn out immediately. Get light switches that glow.  Arrange furniture to create clear pathways. Keep furniture in the same place.  Firmly attach carpet with non-skid or double-sided tape.  Eliminate uneven floor surfaces.  Select a carpet pattern that does not visually hide the edge of steps.  Be aware of all pets. OTHER HOME SAFETY TIPS  Set the water temperature for 120 F (48.8 C).  Keep emergency numbers on or near the telephone.  Keep smoke detectors on every level of the home and near sleeping areas. Document Released: 07/29/2002 Document Revised: 02/07/2012 Document Reviewed: 10/28/2011 [facility] Patient Information 2014 [place].    Follow-Up Instructions: Return in about 4 weeks (around 10/10/2019).   Orders:  Orders Placed This Encounter  Procedures  . XR Scapula Right  . Ambulatory referral to Physical Therapy   No orders of the defined types were placed in this encounter.     Procedures: No procedures performed   Clinical Data: No additional findings.   Subjective: Chief Complaint  Patient presents with  . Right Shoulder - Follow-up    74 year old female with history of fall with right scapula fracture that was closed and comminuted scapula body fracture. The injury occurred Thanksgiving evening and she was placed in a sling. She has been in PT for the last few weeks. She reports that she dismissed her physical therapist and continues with occupational therapy.    Review of Systems  Constitutional: Negative.   HENT: Negative.   Eyes: Negative.   Respiratory: Negative.   Cardiovascular: Negative.   Gastrointestinal: Negative.   Endocrine: Negative.   Genitourinary: Negative for enuresis and  flank pain.  Musculoskeletal: Positive for back pain. Negative for gait problem, joint swelling, myalgias, neck pain and neck stiffness.  Skin: Negative.  Negative for color change, pallor, rash and wound.  Allergic/Immunologic: Negative for environmental allergies, food allergies and immunocompromised state.  Neurological: Negative.  Negative for dizziness, tremors, seizures, syncope, facial asymmetry, speech difficulty, light-headedness, numbness and headaches.  Hematological: Negative.  Negative for adenopathy. Does not bruise/bleed easily.  Psychiatric/Behavioral: Negative.  Negative for agitation, behavioral problems, confusion, decreased concentration, dysphoric mood, hallucinations, self-injury, sleep disturbance and suicidal ideas. The patient is not nervous/anxious and is not hyperactive.      Objective: Vital Signs: BP 140/62 (BP Location: Left Arm, Patient Position: Sitting)   Pulse (!) 54   Ht 5' 1.5" (1.562 m)   Wt 137 lb (62.1 kg)   BMI 25.47 kg/m   Physical Exam Constitutional:      Appearance: She is well-developed.  HENT:     Head: Normocephalic and atraumatic.  Eyes:     Pupils: Pupils are equal, round, and reactive to light.  Pulmonary:     Effort: Pulmonary effort is normal.     Breath sounds: Normal breath sounds.  Abdominal:     General: Bowel sounds are normal.     Palpations: Abdomen is soft.  Musculoskeletal:     Cervical back: Normal range of motion and neck supple.  Skin:    General: Skin is warm and dry.  Neurological:     Mental Status: She is alert and oriented to person, place, and time.  Psychiatric:        Behavior: Behavior normal.        Thought Content: Thought content normal.        Judgment: Judgment normal.     Right Shoulder Exam   Tenderness  The patient is experiencing tenderness in the acromioclavicular joint.  Range of Motion  Active abduction:  130 abnormal  Passive abduction:  130 abnormal  Extension:  20 abnormal    External rotation:  60 abnormal  Forward flexion:  130 abnormal  Internal rotation 0 degrees: T10  Internal rotation 90 degrees: 70   Muscle Strength  Abduction: 4/5  Internal rotation: 4/5  External rotation: 4/5  Supraspinatus: 4/5  Subscapularis: 4/5  Biceps: 4/5   Tests  Apprehension: negative Hawkins test: negative Cross arm: positive Impingement: negative Drop arm: negative Sulcus: absent   Left Shoulder Exam   Tenderness  The patient is experiencing tenderness in the acromion.  Range of Motion  Active abduction: abnormal  Passive abduction: abnormal  Extension: abnormal  External rotation: abnormal  Forward flexion: abnormal  Internal rotation 0 degrees: abnormal  Internal rotation 90 degrees: abnormal   Muscle Strength  Abduction: 5/5  Internal rotation: 5/5  External rotation: 5/5  Supraspinatus: 5/5  Subscapularis: 5/5  Biceps: 5/5   Tests  Apprehension: negative Hawkins test: negative Cross arm: negative Impingement: negative Drop arm: negative      Specialty Comments:  No specialty comments available.  Imaging: XR Scapula Right  Result Date: 09/12/2019 AP and scapula y view with right scapula body fracture that is comminuted and in good position and alignment. There is a fracture fragment at the inferior scapula angle.     PMFS History: Patient Active Problem List   Diagnosis Date Noted  . Other forms of scoliosis, thoracolumbar region 11/03/2016    Priority: High    Class: Chronic  . Spinal stenosis in cervical region 05/06/2014    Priority: High    Class: Chronic  . Cervical spondylosis without myelopathy 05/06/2014    Priority: High  . Gait disorder 07/05/2016  . HCAP (healthcare-associated pneumonia) 05/09/2014  . Acute respiratory failure with hypoxia ([facility]) 05/09/2014  . Pneumonia 05/09/2014  . Hypertension   . Hypothyroidism   . Breast cancer ([place])   . Cervical stenosis (uterine cervix) 05/07/2014  . HNP  (herniated nucleus pulposus), cervical 05/06/2014  . Adult hypothyroidism 05/03/2013  . 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 Primary malignant neoplasm of breast ([place] [facility]) 10/24/2002   Past Medical History:  Diagnosis Date  . Arthritis   . Blood dyscrasia    bleeds and bruises easily  . Breast cancer ([place])   . Gait disorder 07/05/2016  . Hepatitis     Hep A years ago  . Hypertension   . Hypothyroidism    Graves disease, age 16 yo  . Nose trouble    right  nostril will hemorrhage at times  . Pneumonia   . Rosacea     Family History  Problem Relation Age of Onset  . Congestive Heart Failure Mother   . Heart disease Father   . Tongue cancer Grandchild     Past Surgical History:  Procedure Laterality Date  . ABDOMINAL HYSTERECTOMY     Age 41  . ANTERIOR CERVICAL DECOMP/DISCECTOMY FUSION N/A 05/06/2014   Procedure: ANTERIOR CERVICAL DISCECTOMY FUSION C3-4 with transgraft, local bone graft, plate and screws;  Surgeon: Jessy Oto, MD;  Location: [facility];  Service: Orthopedics;  Laterality: N/A;  . APPENDECTOMY    . BREAST LUMPECTOMY Left   . COLONOSCOPY    . EYE SURGERY Bilateral    cataracts  . LUMBAR SPINE SURGERY  2011   Removal of hematoma  . ORIF HUMERUS FRACTURE Left 2013  . THORACIC SPINE SURGERY  2011   Bone cages  . THYROIDECTOMY    . TONSILLECTOMY     Social History   Occupational History  . Occupation: Retired  Tobacco Use  . Smoking status: Former Smoker    Types: Cigarettes    Quit date: 04/23/1962    Years since quitting: 57.4  . Smokeless tobacco: Never Used  . Tobacco comment: as teenager  Substance and Sexual Activity  . Alcohol use: Yes    Comment: Social  . Drug use: No  . Sexual activity: Not on file

## 2019-09-13 DIAGNOSIS — C50912 Malignant neoplasm of unspecified site of left female breast: Secondary | ICD-10-CM | POA: Diagnosis not present

## 2019-09-13 DIAGNOSIS — C787 Secondary malignant neoplasm of liver and intrahepatic bile duct: Secondary | ICD-10-CM | POA: Diagnosis not present

## 2019-09-13 DIAGNOSIS — C7800 Secondary malignant neoplasm of unspecified lung: Secondary | ICD-10-CM | POA: Diagnosis not present

## 2019-09-13 DIAGNOSIS — Z5111 Encounter for antineoplastic chemotherapy: Secondary | ICD-10-CM | POA: Diagnosis not present

## 2019-09-16 DIAGNOSIS — S42111D Displaced fracture of body of scapula, right shoulder, subsequent encounter for fracture with routine healing: Secondary | ICD-10-CM | POA: Diagnosis not present

## 2019-09-16 DIAGNOSIS — I10 Essential (primary) hypertension: Secondary | ICD-10-CM | POA: Diagnosis not present

## 2019-09-16 DIAGNOSIS — E039 Hypothyroidism, unspecified: Secondary | ICD-10-CM | POA: Diagnosis not present

## 2019-09-16 DIAGNOSIS — M5416 Radiculopathy, lumbar region: Secondary | ICD-10-CM | POA: Diagnosis not present

## 2019-09-16 DIAGNOSIS — C50919 Malignant neoplasm of unspecified site of unspecified female breast: Secondary | ICD-10-CM | POA: Diagnosis not present

## 2019-09-16 DIAGNOSIS — R2689 Other abnormalities of gait and mobility: Secondary | ICD-10-CM | POA: Diagnosis not present

## 2019-09-17 ENCOUNTER — Telehealth: Payer: Self-pay | Admitting: Specialist

## 2019-09-17 NOTE — Telephone Encounter (Signed)
FYIDaleen Snook from [facility] called and stated that patient declined PT HH at this time but will continue OT for her shoulder.  Patient does not want PT at this time but may want Out Patient PT at a later date.

## 2019-09-17 NOTE — Telephone Encounter (Signed)
[name] from [facility] called and stated that patient declined PT HH at this time but will continue OT for her shoulder.   Patient does not want PT at this time but may want Out Patient PT at a later date.

## 2019-09-18 NOTE — Telephone Encounter (Signed)
OK 

## 2019-09-19 DIAGNOSIS — I10 Essential (primary) hypertension: Secondary | ICD-10-CM | POA: Diagnosis not present

## 2019-09-19 DIAGNOSIS — E039 Hypothyroidism, unspecified: Secondary | ICD-10-CM | POA: Diagnosis not present

## 2019-09-19 DIAGNOSIS — F419 Anxiety disorder, unspecified: Secondary | ICD-10-CM | POA: Diagnosis not present

## 2019-09-19 DIAGNOSIS — K219 Gastro-esophageal reflux disease without esophagitis: Secondary | ICD-10-CM | POA: Diagnosis not present

## 2019-09-19 DIAGNOSIS — G894 Chronic pain syndrome: Secondary | ICD-10-CM | POA: Diagnosis not present

## 2019-09-26 DIAGNOSIS — I10 Essential (primary) hypertension: Secondary | ICD-10-CM | POA: Diagnosis not present

## 2019-09-26 DIAGNOSIS — R2689 Other abnormalities of gait and mobility: Secondary | ICD-10-CM | POA: Diagnosis not present

## 2019-09-26 DIAGNOSIS — M5416 Radiculopathy, lumbar region: Secondary | ICD-10-CM | POA: Diagnosis not present

## 2019-09-26 DIAGNOSIS — E039 Hypothyroidism, unspecified: Secondary | ICD-10-CM | POA: Diagnosis not present

## 2019-09-26 DIAGNOSIS — S42111D Displaced fracture of body of scapula, right shoulder, subsequent encounter for fracture with routine healing: Secondary | ICD-10-CM | POA: Diagnosis not present

## 2019-09-26 DIAGNOSIS — C50919 Malignant neoplasm of unspecified site of unspecified female breast: Secondary | ICD-10-CM | POA: Diagnosis not present

## 2019-10-04 DIAGNOSIS — C50912 Malignant neoplasm of unspecified site of left female breast: Secondary | ICD-10-CM | POA: Diagnosis not present

## 2019-10-04 DIAGNOSIS — C787 Secondary malignant neoplasm of liver and intrahepatic bile duct: Secondary | ICD-10-CM | POA: Diagnosis not present

## 2019-10-04 DIAGNOSIS — Z5111 Encounter for antineoplastic chemotherapy: Secondary | ICD-10-CM | POA: Diagnosis not present

## 2019-10-04 DIAGNOSIS — C7800 Secondary malignant neoplasm of unspecified lung: Secondary | ICD-10-CM | POA: Diagnosis not present

## 2019-10-14 ENCOUNTER — Other Ambulatory Visit: Payer: Self-pay

## 2019-10-14 ENCOUNTER — Ambulatory Visit: Payer: Medicare Other | Admitting: Specialist

## 2019-10-14 ENCOUNTER — Ambulatory Visit (INDEPENDENT_AMBULATORY_CARE_PROVIDER_SITE_OTHER): Payer: Medicare Other | Admitting: Specialist

## 2019-10-14 ENCOUNTER — Encounter: Payer: Self-pay | Admitting: Specialist

## 2019-10-14 ENCOUNTER — Ambulatory Visit (INDEPENDENT_AMBULATORY_CARE_PROVIDER_SITE_OTHER): Payer: Medicare Other

## 2019-10-14 VITALS — BP 159/58 | HR 60 | Ht 61.5 in | Wt 137.0 lb

## 2019-10-14 DIAGNOSIS — S42111A Displaced fracture of body of scapula, right shoulder, initial encounter for closed fracture: Secondary | ICD-10-CM | POA: Diagnosis not present

## 2019-10-14 NOTE — Patient Instructions (Signed)
Plan: Avoid overhead lifting and overhead use of the arms. Do not lift greater than 5 lbs. Adjust head rest in vehicle to prevent hyperextension if rear ended. Take extra precautions to avoid falling, including use of a cane if you feel weak. When Exercise facilities reopen you would want to get into a pool exercise program and work on balance and  coordination.

## 2019-10-14 NOTE — Progress Notes (Signed)
Office Visit Note   Patient: Karen Allison           Date of Birth: 1946-04-10           MRN: [id number] Visit Date: 10/14/2019              Requested by: [name], MD [address]. [place],  [place] [address] PCP: [name], MD   Assessment & Plan: Visit Diagnoses:  1. Closed displaced fracture of body of right scapula, initial encounter     Plan: Avoid overhead lifting and overhead use of the arms. Do not lift greater than 5 lbs. Adjust head rest in vehicle to prevent hyperextension if rear ended. Take extra precautions to avoid falling, including use of a cane if you feel weak. When Exercise facilities reopen you would want to get into a pool exercise program and work on balance and  Coordination.   Follow-Up Instructions: No follow-ups on file.   Orders:  Orders Placed This Encounter  Procedures  . XR Scapula Right   No orders of the defined types were placed in this encounter.     Procedures: No procedures performed   Clinical Data: No additional findings.   Subjective: Chief Complaint  Patient presents with  . Right Shoulder - Follow-up    74 year old female right handed, fell with right scapula body fracture extra articular occurred about Thanksgiving evening. Now she is about 2 weeks post discharge from PT. She relates the pain is better and she is wondering if PT would help the left shoulder.    Review of Systems  Constitutional: Negative.   HENT: Negative.   Eyes: Negative.   Respiratory: Negative.   Cardiovascular: Negative.   Endocrine: Negative.   Genitourinary: Negative.   Musculoskeletal: Negative.   Skin: Negative.   Allergic/Immunologic: Negative.   Neurological: Negative.   Hematological: Negative.   Psychiatric/Behavioral: Negative.   All other systems reviewed and are negative.    Objective: Vital Signs: BP (!) 159/58 (BP Location: Left Arm, Patient Position: Sitting)   Pulse 60   Ht 5' 1.5" (1.562 m)   Wt  137 lb (62.1 kg)   BMI 25.47 kg/m   Physical Exam  Ortho Exam  Specialty Comments:  No specialty comments available.  Imaging: No results found.   PMFS History: Patient Active Problem List   Diagnosis Date Noted  . Other forms of scoliosis, thoracolumbar region 11/03/2016    Priority: High    Class: Chronic  . Spinal stenosis in cervical region 05/06/2014    Priority: High    Class: Chronic  . Cervical spondylosis without myelopathy 05/06/2014    Priority: High  . Gait disorder 07/05/2016  . HCAP (healthcare-associated pneumonia) 05/09/2014  . Acute respiratory failure with hypoxia ([facility]) 05/09/2014  . Pneumonia 05/09/2014  . Hypertension   . Hypothyroidism   . Breast cancer ([facility])   . Cervical stenosis (uterine cervix) 05/07/2014  . HNP (herniated nucleus pulposus), cervical 05/06/2014  . Adult hypothyroidism 05/03/2013  . Secondary hypocortisolism ([facility]) 05/03/2013  . Primary malignant neoplasm of breast ([facility]) 10/24/2002   Past Medical History:  Diagnosis Date  . Arthritis   . Blood dyscrasia    bleeds and bruises easily  . Breast cancer ([facility])   . Gait disorder 07/05/2016  . Hepatitis     Hep A years ago  . Hypertension   . Hypothyroidism    Graves disease, age 68 yo  . Nose trouble    right nostril  will hemorrhage at times  . Pneumonia   . Rosacea     Family History  Problem Relation Age of Onset  . Congestive Heart Failure Mother   . Heart disease Father   . Tongue cancer Grandchild     Past Surgical History:  Procedure Laterality Date  . ABDOMINAL HYSTERECTOMY     Age 23  . ANTERIOR CERVICAL DECOMP/DISCECTOMY FUSION N/A 05/06/2014   Procedure: ANTERIOR CERVICAL DISCECTOMY FUSION C3-4 with transgraft, local bone graft, plate and screws;  Surgeon: Jessy Oto, MD;  Location: [facility];  Service: Orthopedics;  Laterality: N/A;  . APPENDECTOMY    . BREAST LUMPECTOMY Left   . COLONOSCOPY    . EYE SURGERY Bilateral    cataracts  . LUMBAR SPINE SURGERY   2011   Removal of hematoma  . ORIF HUMERUS FRACTURE Left 2013  . THORACIC SPINE SURGERY  2011   Bone cages  . THYROIDECTOMY    . TONSILLECTOMY     Social History   Occupational History  . Occupation: Retired  Tobacco Use  . Smoking status: Former Smoker    Types: Cigarettes    Quit date: 04/23/1962    Years since quitting: 57.5  . Smokeless tobacco: Never Used  . Tobacco comment: as teenager  Substance and Sexual Activity  . Alcohol use: Yes    Comment: Social  . Drug use: No  . Sexual activity: Not on file

## 2019-10-25 DIAGNOSIS — C787 Secondary malignant neoplasm of liver and intrahepatic bile duct: Secondary | ICD-10-CM | POA: Diagnosis not present

## 2019-10-25 DIAGNOSIS — C50912 Malignant neoplasm of unspecified site of left female breast: Secondary | ICD-10-CM | POA: Diagnosis not present

## 2019-10-25 DIAGNOSIS — M810 Age-related osteoporosis without current pathological fracture: Secondary | ICD-10-CM | POA: Diagnosis not present

## 2019-10-25 DIAGNOSIS — C50919 Malignant neoplasm of unspecified site of unspecified female breast: Secondary | ICD-10-CM | POA: Diagnosis not present

## 2019-10-25 DIAGNOSIS — C7800 Secondary malignant neoplasm of unspecified lung: Secondary | ICD-10-CM | POA: Diagnosis not present

## 2019-10-29 ENCOUNTER — Other Ambulatory Visit: Payer: Medicare Other

## 2019-11-12 ENCOUNTER — Other Ambulatory Visit: Payer: Self-pay | Admitting: Oncology

## 2019-11-12 DIAGNOSIS — C50912 Malignant neoplasm of unspecified site of left female breast: Secondary | ICD-10-CM

## 2019-11-12 DIAGNOSIS — M8589 Other specified disorders of bone density and structure, multiple sites: Secondary | ICD-10-CM

## 2019-11-13 ENCOUNTER — Other Ambulatory Visit: Payer: Self-pay

## 2019-11-13 ENCOUNTER — Ambulatory Visit
Admission: RE | Admit: 2019-11-13 | Discharge: 2019-11-13 | Disposition: A | Discharge status: Home / Self Care | Payer: Medicare Other | Source: Ambulatory Visit | Attending: Oncology | Admitting: Oncology

## 2019-11-13 DIAGNOSIS — M8589 Other specified disorders of bone density and structure, multiple sites: Secondary | ICD-10-CM

## 2019-11-13 DIAGNOSIS — Z1231 Encounter for screening mammogram for malignant neoplasm of breast: Secondary | ICD-10-CM

## 2019-11-13 DIAGNOSIS — Z780 Asymptomatic menopausal state: Secondary | ICD-10-CM | POA: Diagnosis not present

## 2019-11-13 DIAGNOSIS — M810 Age-related osteoporosis without current pathological fracture: Secondary | ICD-10-CM | POA: Diagnosis not present

## 2019-11-13 DIAGNOSIS — M85852 Other specified disorders of bone density and structure, left thigh: Secondary | ICD-10-CM | POA: Diagnosis not present

## 2019-11-13 DIAGNOSIS — C50912 Malignant neoplasm of unspecified site of left female breast: Secondary | ICD-10-CM

## 2019-11-15 DIAGNOSIS — C7800 Secondary malignant neoplasm of unspecified lung: Secondary | ICD-10-CM | POA: Diagnosis not present

## 2019-11-15 DIAGNOSIS — Z5111 Encounter for antineoplastic chemotherapy: Secondary | ICD-10-CM | POA: Diagnosis not present

## 2019-11-15 DIAGNOSIS — C50912 Malignant neoplasm of unspecified site of left female breast: Secondary | ICD-10-CM | POA: Diagnosis not present

## 2019-11-15 DIAGNOSIS — C787 Secondary malignant neoplasm of liver and intrahepatic bile duct: Secondary | ICD-10-CM | POA: Diagnosis not present

## 2019-12-06 DIAGNOSIS — C787 Secondary malignant neoplasm of liver and intrahepatic bile duct: Secondary | ICD-10-CM | POA: Diagnosis not present

## 2019-12-06 DIAGNOSIS — C7800 Secondary malignant neoplasm of unspecified lung: Secondary | ICD-10-CM | POA: Diagnosis not present

## 2019-12-06 DIAGNOSIS — C50912 Malignant neoplasm of unspecified site of left female breast: Secondary | ICD-10-CM | POA: Diagnosis not present

## 2019-12-06 DIAGNOSIS — Z5111 Encounter for antineoplastic chemotherapy: Secondary | ICD-10-CM | POA: Diagnosis not present

## 2019-12-18 DIAGNOSIS — I10 Essential (primary) hypertension: Secondary | ICD-10-CM | POA: Diagnosis not present

## 2019-12-18 DIAGNOSIS — M810 Age-related osteoporosis without current pathological fracture: Secondary | ICD-10-CM | POA: Diagnosis not present

## 2019-12-18 DIAGNOSIS — R2681 Unsteadiness on feet: Secondary | ICD-10-CM | POA: Diagnosis not present

## 2019-12-18 DIAGNOSIS — F419 Anxiety disorder, unspecified: Secondary | ICD-10-CM | POA: Diagnosis not present

## 2019-12-18 DIAGNOSIS — G894 Chronic pain syndrome: Secondary | ICD-10-CM | POA: Diagnosis not present

## 2019-12-18 DIAGNOSIS — E039 Hypothyroidism, unspecified: Secondary | ICD-10-CM | POA: Diagnosis not present

## 2019-12-18 DIAGNOSIS — K219 Gastro-esophageal reflux disease without esophagitis: Secondary | ICD-10-CM | POA: Diagnosis not present

## 2019-12-27 DIAGNOSIS — C7800 Secondary malignant neoplasm of unspecified lung: Secondary | ICD-10-CM | POA: Diagnosis not present

## 2019-12-27 DIAGNOSIS — C787 Secondary malignant neoplasm of liver and intrahepatic bile duct: Secondary | ICD-10-CM | POA: Diagnosis not present

## 2019-12-27 DIAGNOSIS — Z5111 Encounter for antineoplastic chemotherapy: Secondary | ICD-10-CM | POA: Diagnosis not present

## 2019-12-27 DIAGNOSIS — C50912 Malignant neoplasm of unspecified site of left female breast: Secondary | ICD-10-CM | POA: Diagnosis not present

## 2020-01-13 ENCOUNTER — Ambulatory Visit: Payer: Medicare Other | Admitting: Specialist

## 2020-01-17 DIAGNOSIS — C50912 Malignant neoplasm of unspecified site of left female breast: Secondary | ICD-10-CM | POA: Diagnosis not present

## 2020-01-17 DIAGNOSIS — C787 Secondary malignant neoplasm of liver and intrahepatic bile duct: Secondary | ICD-10-CM | POA: Diagnosis not present

## 2020-01-17 DIAGNOSIS — C7800 Secondary malignant neoplasm of unspecified lung: Secondary | ICD-10-CM | POA: Diagnosis not present

## 2020-01-17 DIAGNOSIS — Z5111 Encounter for antineoplastic chemotherapy: Secondary | ICD-10-CM | POA: Diagnosis not present

## 2020-02-20 ENCOUNTER — Ambulatory Visit: Payer: Medicare Other | Admitting: Specialist

## 2020-02-27 ENCOUNTER — Other Ambulatory Visit: Payer: Self-pay

## 2020-02-27 ENCOUNTER — Encounter: Payer: Self-pay | Admitting: Specialist

## 2020-02-27 ENCOUNTER — Ambulatory Visit (INDEPENDENT_AMBULATORY_CARE_PROVIDER_SITE_OTHER): Payer: Medicare Other | Admitting: Specialist

## 2020-02-27 VITALS — BP 147/72 | HR 60 | Ht 61.5 in | Wt 137.0 lb

## 2020-02-27 DIAGNOSIS — R2689 Other abnormalities of gait and mobility: Secondary | ICD-10-CM

## 2020-02-27 DIAGNOSIS — M4326 Fusion of spine, lumbar region: Secondary | ICD-10-CM | POA: Diagnosis not present

## 2020-02-27 DIAGNOSIS — W19XXXD Unspecified fall, subsequent encounter: Secondary | ICD-10-CM | POA: Diagnosis not present

## 2020-02-27 DIAGNOSIS — M48062 Spinal stenosis, lumbar region with neurogenic claudication: Secondary | ICD-10-CM

## 2020-02-27 NOTE — Patient Instructions (Signed)
Avoid frequent bending and stooping  No lifting greater than 10 lbs. May use ice or moist heat for pain. Weight loss is of benefit. Best medication for lumbar disc disease is arthritis medications like motrin, celebrex and naprosyn. Exercise is important to improve your indurance and does allow people to function better inspite of back pain.   

## 2020-02-27 NOTE — Progress Notes (Signed)
Office Visit Note   Patient: Karen Allison           Date of Birth: 05-22-1946           MRN: [id number] Visit Date: 02/27/2020              Requested by: [name], MD [address]. [place],  [place] [address] PCP: [name], MD   Assessment & Plan: Visit Diagnoses:  1. Balance problem   2. Fall, subsequent encounter   3. Fusion of lumbar spine   4. Spinal stenosis of lumbar region with neurogenic claudication     Plan: Avoid frequent bending and stooping  No lifting greater than 10 lbs. May use ice or moist heat for pain. Weight loss is of benefit. Best medication for lumbar disc disease is arthritis medications like motrin, celebrex and naprosyn. Exercise is important to improve your indurance and does allow people to function better inspite of back pain.    Follow-Up Instructions: No follow-ups on file.   Orders:  No orders of the defined types were placed in this encounter.  No orders of the defined types were placed in this encounter.     Procedures: No procedures performed   Clinical Data: No additional findings.   Subjective: Chief Complaint  Patient presents with  . Right Shoulder - Follow-up    74 year old female with history of lumbar fusions and decompression for lumbar spinal stenosis and scoliosis. She has persistent bilateral leg neuropathy. She taking neurontin 600mg  po 4-5 times per day and also takes robaxin for muscle spasm. Her oncologist at [facility] has retired. She is having no problems with the right shoulder scapula fracture but is still having leg pains burning sensation and nerve pain. Over all she feels worse.   Review of Systems  Constitutional: Negative.   HENT: Negative.   Eyes: Negative.   Respiratory: Negative.   Cardiovascular: Negative.   Gastrointestinal: Negative.   Endocrine: Negative.   Genitourinary: Negative.   Musculoskeletal: Negative.   Skin: Negative.   Allergic/Immunologic: Negative.     Neurological: Negative.   Hematological: Negative.   Psychiatric/Behavioral: Negative.      Objective: Vital Signs: BP (!) 147/72 (BP Location: Left Arm, Patient Position: Sitting)   Pulse 60   Ht 5' 1.5" (1.562 m)   Wt 137 lb (62.1 kg)   BMI 25.47 kg/m   Physical Exam Constitutional:      Appearance: She is well-developed.  HENT:     Head: Normocephalic and atraumatic.  Eyes:     Pupils: Pupils are equal, round, and reactive to light.  Pulmonary:     Effort: Pulmonary effort is normal.     Breath sounds: Normal breath sounds.  Abdominal:     General: Bowel sounds are normal.     Palpations: Abdomen is soft.  Musculoskeletal:     Cervical back: Normal range of motion and neck supple.     Lumbar back: Negative right straight leg raise test and negative left straight leg raise test.  Skin:    General: Skin is warm and dry.  Neurological:     Mental Status: She is alert and oriented to person, place, and time.  Psychiatric:        Behavior: Behavior normal.        Thought Content: Thought content normal.        Judgment: Judgment normal.     Back Exam   Tenderness  The patient is experiencing tenderness  in the lumbar.  Range of Motion  Extension: abnormal  Flexion: abnormal  Lateral bend right: normal  Lateral bend left: normal  Rotation right: normal   Muscle Strength  Right Quadriceps:  5/5  Left Quadriceps:  5/5  Right Hamstrings:  5/5  Left Hamstrings:  5/5   Tests  Straight leg raise right: negative Straight leg raise left: negative  Reflexes  Patellar: 2/4 Achilles: 0/4 Biceps: 2/4  Other  Toe walk: normal Heel walk: normal Sensation: decreased Gait: abnormal       Specialty Comments:  No specialty comments available.  Imaging: No results found.   PMFS History: Patient Active Problem List   Diagnosis Date Noted  . Other forms of scoliosis, thoracolumbar region 11/03/2016    Priority: High    Class: Chronic  . Spinal  stenosis in cervical region 05/06/2014    Priority: High    Class: Chronic  . Cervical spondylosis without myelopathy 05/06/2014    Priority: High  . Gait disorder 07/05/2016  . HCAP (healthcare-associated pneumonia) 05/09/2014  . Acute respiratory failure with hypoxia ([facility]) 05/09/2014  . Pneumonia 05/09/2014  . Hypertension   . Hypothyroidism   . Breast cancer ([facility])   . Cervical stenosis (uterine cervix) 05/07/2014  . HNP (herniated nucleus pulposus), cervical 05/06/2014  . Adult hypothyroidism 05/03/2013  . Secondary hypocortisolism ([facility]) 05/03/2013  . Primary malignant neoplasm of breast ([facility]) 10/24/2002   Past Medical History:  Diagnosis Date  . Arthritis   . Blood dyscrasia    bleeds and bruises easily  . Breast cancer ([facility])    left  . Gait disorder 07/05/2016  . Hepatitis     Hep A years ago  . Hypertension   . Hypothyroidism    Graves disease, age 69 yo  . Nose trouble    right nostril will hemorrhage at times  . Pneumonia   . Rosacea     Family History  Problem Relation Age of Onset  . Congestive Heart Failure Mother   . Heart disease Father   . Tongue cancer Grandchild     Past Surgical History:  Procedure Laterality Date  . ABDOMINAL HYSTERECTOMY     Age 39  . ANTERIOR CERVICAL DECOMP/DISCECTOMY FUSION N/A 05/06/2014   Procedure: ANTERIOR CERVICAL DISCECTOMY FUSION C3-4 with transgraft, local bone graft, plate and screws;  Surgeon: Jessy Oto, MD;  Location: [facility];  Service: Orthopedics;  Laterality: N/A;  . APPENDECTOMY    . BREAST LUMPECTOMY Left   . COLONOSCOPY    . EYE SURGERY Bilateral    cataracts  . LUMBAR SPINE SURGERY  2011   Removal of hematoma  . ORIF HUMERUS FRACTURE Left 2013  . THORACIC SPINE SURGERY  2011   Bone cages  . THYROIDECTOMY    . TONSILLECTOMY     Social History   Occupational History  . Occupation: Retired  Tobacco Use  . Smoking status: Former Smoker    Types: Cigarettes    Quit date: 04/23/1962    Years since  quitting: 57.8  . Smokeless tobacco: Never Used  . Tobacco comment: as teenager  Substance and Sexual Activity  . Alcohol use: Yes    Comment: Social  . Drug use: No  . Sexual activity: Not on file

## 2020-02-28 DIAGNOSIS — I351 Nonrheumatic aortic (valve) insufficiency: Secondary | ICD-10-CM | POA: Diagnosis not present

## 2020-02-28 DIAGNOSIS — C50912 Malignant neoplasm of unspecified site of left female breast: Secondary | ICD-10-CM | POA: Diagnosis not present

## 2020-02-28 DIAGNOSIS — Z5111 Encounter for antineoplastic chemotherapy: Secondary | ICD-10-CM | POA: Diagnosis not present

## 2020-02-28 DIAGNOSIS — T451X1D Poisoning by antineoplastic and immunosuppressive drugs, accidental (unintentional), subsequent encounter: Secondary | ICD-10-CM | POA: Diagnosis not present

## 2020-02-28 DIAGNOSIS — C7800 Secondary malignant neoplasm of unspecified lung: Secondary | ICD-10-CM | POA: Diagnosis not present

## 2020-02-28 DIAGNOSIS — C787 Secondary malignant neoplasm of liver and intrahepatic bile duct: Secondary | ICD-10-CM | POA: Diagnosis not present

## 2020-03-05 DIAGNOSIS — C3490 Malignant neoplasm of unspecified part of unspecified bronchus or lung: Secondary | ICD-10-CM | POA: Diagnosis not present

## 2020-03-05 DIAGNOSIS — C787 Secondary malignant neoplasm of liver and intrahepatic bile duct: Secondary | ICD-10-CM | POA: Diagnosis not present

## 2020-03-05 DIAGNOSIS — Z170 Estrogen receptor positive status [ER+]: Secondary | ICD-10-CM | POA: Diagnosis not present

## 2020-03-05 DIAGNOSIS — C7800 Secondary malignant neoplasm of unspecified lung: Secondary | ICD-10-CM | POA: Diagnosis not present

## 2020-03-05 DIAGNOSIS — Z79899 Other long term (current) drug therapy: Secondary | ICD-10-CM | POA: Diagnosis not present

## 2020-03-05 DIAGNOSIS — C50919 Malignant neoplasm of unspecified site of unspecified female breast: Secondary | ICD-10-CM | POA: Diagnosis not present

## 2020-03-05 DIAGNOSIS — C50912 Malignant neoplasm of unspecified site of left female breast: Secondary | ICD-10-CM | POA: Diagnosis not present

## 2020-03-09 DIAGNOSIS — E871 Hypo-osmolality and hyponatremia: Secondary | ICD-10-CM | POA: Diagnosis not present

## 2020-03-19 DIAGNOSIS — E871 Hypo-osmolality and hyponatremia: Secondary | ICD-10-CM | POA: Diagnosis not present

## 2020-03-19 DIAGNOSIS — G894 Chronic pain syndrome: Secondary | ICD-10-CM | POA: Diagnosis not present

## 2020-03-19 DIAGNOSIS — F419 Anxiety disorder, unspecified: Secondary | ICD-10-CM | POA: Diagnosis not present

## 2020-03-19 DIAGNOSIS — E039 Hypothyroidism, unspecified: Secondary | ICD-10-CM | POA: Diagnosis not present

## 2020-03-20 DIAGNOSIS — C7800 Secondary malignant neoplasm of unspecified lung: Secondary | ICD-10-CM | POA: Diagnosis not present

## 2020-03-20 DIAGNOSIS — Z5111 Encounter for antineoplastic chemotherapy: Secondary | ICD-10-CM | POA: Diagnosis not present

## 2020-03-20 DIAGNOSIS — C787 Secondary malignant neoplasm of liver and intrahepatic bile duct: Secondary | ICD-10-CM | POA: Diagnosis not present

## 2020-03-20 DIAGNOSIS — C50912 Malignant neoplasm of unspecified site of left female breast: Secondary | ICD-10-CM | POA: Diagnosis not present

## 2020-03-24 DIAGNOSIS — L219 Seborrheic dermatitis, unspecified: Secondary | ICD-10-CM | POA: Diagnosis not present

## 2020-03-24 DIAGNOSIS — L719 Rosacea, unspecified: Secondary | ICD-10-CM | POA: Diagnosis not present

## 2020-04-01 ENCOUNTER — Telehealth: Payer: Self-pay | Admitting: Specialist

## 2020-04-01 NOTE — Telephone Encounter (Signed)
He is a [place] patient and I advised that they would need to make a referral to our office, that we can't just see him with out it.  She states that she understands

## 2020-04-01 NOTE — Telephone Encounter (Signed)
Patient called.   She is requesting a call back to determine whether or not her issue is something that can be treated here  Call back: [phone]

## 2020-04-01 NOTE — Telephone Encounter (Signed)
Patient was calling in regards to her grandson that had an injury back in May, she wanted a reference as who to see, and due it being ankle/lower leg, I advised Dr. Sharol Given

## 2020-04-08 DIAGNOSIS — Z92241 Personal history of systemic steroid therapy: Secondary | ICD-10-CM | POA: Diagnosis not present

## 2020-04-08 DIAGNOSIS — E0500 Thyrotoxicosis with diffuse goiter without thyrotoxic crisis or storm: Secondary | ICD-10-CM | POA: Diagnosis not present

## 2020-04-08 DIAGNOSIS — E871 Hypo-osmolality and hyponatremia: Secondary | ICD-10-CM | POA: Diagnosis not present

## 2020-04-08 DIAGNOSIS — Z8639 Personal history of other endocrine, nutritional and metabolic disease: Secondary | ICD-10-CM | POA: Diagnosis not present

## 2020-04-10 DIAGNOSIS — Z92241 Personal history of systemic steroid therapy: Secondary | ICD-10-CM | POA: Diagnosis not present

## 2020-04-10 DIAGNOSIS — C50912 Malignant neoplasm of unspecified site of left female breast: Secondary | ICD-10-CM | POA: Diagnosis not present

## 2020-04-10 DIAGNOSIS — C787 Secondary malignant neoplasm of liver and intrahepatic bile duct: Secondary | ICD-10-CM | POA: Diagnosis not present

## 2020-04-10 DIAGNOSIS — Z5111 Encounter for antineoplastic chemotherapy: Secondary | ICD-10-CM | POA: Diagnosis not present

## 2020-04-10 DIAGNOSIS — C7800 Secondary malignant neoplasm of unspecified lung: Secondary | ICD-10-CM | POA: Diagnosis not present

## 2020-04-10 DIAGNOSIS — Z8639 Personal history of other endocrine, nutritional and metabolic disease: Secondary | ICD-10-CM | POA: Diagnosis not present

## 2020-04-10 DIAGNOSIS — E871 Hypo-osmolality and hyponatremia: Secondary | ICD-10-CM | POA: Diagnosis not present

## 2020-04-21 DIAGNOSIS — Z92241 Personal history of systemic steroid therapy: Secondary | ICD-10-CM | POA: Diagnosis not present

## 2020-04-21 DIAGNOSIS — E871 Hypo-osmolality and hyponatremia: Secondary | ICD-10-CM | POA: Diagnosis not present

## 2020-04-30 DIAGNOSIS — Z5111 Encounter for antineoplastic chemotherapy: Secondary | ICD-10-CM | POA: Diagnosis not present

## 2020-04-30 DIAGNOSIS — C787 Secondary malignant neoplasm of liver and intrahepatic bile duct: Secondary | ICD-10-CM | POA: Diagnosis not present

## 2020-04-30 DIAGNOSIS — C50912 Malignant neoplasm of unspecified site of left female breast: Secondary | ICD-10-CM | POA: Diagnosis not present

## 2020-05-06 ENCOUNTER — Encounter: Payer: Self-pay | Admitting: Surgery

## 2020-05-06 ENCOUNTER — Ambulatory Visit: Payer: Self-pay

## 2020-05-06 ENCOUNTER — Ambulatory Visit (INDEPENDENT_AMBULATORY_CARE_PROVIDER_SITE_OTHER): Payer: Medicare Other | Admitting: Surgery

## 2020-05-06 VITALS — BP 166/68 | HR 57 | Ht 61.5 in | Wt 137.0 lb

## 2020-05-06 DIAGNOSIS — M19112 Post-traumatic osteoarthritis, left shoulder: Secondary | ICD-10-CM | POA: Diagnosis not present

## 2020-05-06 DIAGNOSIS — R0781 Pleurodynia: Secondary | ICD-10-CM

## 2020-05-06 DIAGNOSIS — W19XXXA Unspecified fall, initial encounter: Secondary | ICD-10-CM

## 2020-05-06 DIAGNOSIS — M25512 Pain in left shoulder: Secondary | ICD-10-CM

## 2020-05-06 DIAGNOSIS — R55 Syncope and collapse: Secondary | ICD-10-CM

## 2020-05-06 NOTE — Progress Notes (Signed)
Office Visit Note   Patient: Karen Allison           Date of Birth: [birth year]           MRN: [id number] Visit Date: 05/06/2020              Requested by: [name], MD [address] [place],  [place] [address] PCP: [name], MD   Assessment & Plan: Visit Diagnoses:  1. Acute pain of left shoulder   2. Rib pain on left side   3. Rib pain on right side   4. Syncope, unspecified syncope type   5. Fall, initial encounter   6. Post-traumatic osteoarthritis of left shoulder     Plan: X-rays reviewed with patient today.  She does have a right-sided posterior rib fracture.  Left shoulder no acute injury but patient does have severe posttraumatic DJD.  Advised patient that the rib fracture will eventually heal on its own but will take time.  Regards to her shoulder she understands that treatment for that would be total shoulder replacement but she is definitely not wanting to go that route.  With her chronic balance issues and with the fall that caused this current injury I recommended that she see her primary care physician as soon as possible and see if a cardiologist referral is indicated.  Patient advised that she does not think that she needs to see her doctor and she lost her balance because of issues with her legs.  My assistant was able to get patient an appointment to see Dr. Marcello Moores PCP tomorrow but patient declined.  She can follow-up with Dr. Louanne Skye in a few weeks for recheck.  Advised her not to do any heavy lifting, pushing, pulling with the rib fracture.  She should watch out for any signs of shortness of breath and must go to the ER if this is an issue.  Follow-Up Instructions: Return in about 4 weeks (around 06/03/2020) for with dr Louanne Skye recheck .   Orders:  Orders Placed This Encounter  Procedures  . XR Shoulder Left  . XR Ribs Bilateral   No orders of the defined types were placed in this encounter.     Procedures: No procedures  performed   Clinical Data: No additional findings.   Subjective: Chief Complaint  Patient presents with  . Left Shoulder - Pain  . Rib Injury    HPI 74 year old white female comes in today with complaints of rib pain and left shoulder pain after a fall that occurred on Friday.  Patient has a history of balance issues with multiple falls.  Patient followed by neurologist Dr. Jannifer Franklin.  Was treated previously by Dr. Louanne Skye for a right scapular fracture after a fall last year.  Patient states last Friday she was standing and went to bend over to get something in a cabinet and when she stood up she lost her balance and thinks that she hit a chopping block.  She has pain in the right greater than left side of her ribs.  No breathing issues.  Bruising on the left side.  She also has pain in the left shoulder.  Patient has known history of posttraumatic left shoulder DJD from a comminuted proximal humerus fracture suffered after a fall in 2013.   Objective: Vital Signs: BP (!) 166/68   Pulse (!) 57   Ht 5' 1.5" (1.562 m)   Wt 137 lb (62.1 kg)   BMI 25.47 kg/m   Physical Exam HENT:  Head: Normocephalic.  Eyes:     Extraocular Movements: Extraocular movements intact.     Pupils: Pupils are equal, round, and reactive to light.  Pulmonary:     Effort: No respiratory distress.  Musculoskeletal:     Comments: Right lower posterior ribs are sequentially tender to palpation.  Bruising left side.  Lungs are CTA bilaterally.  Good air exchange.  Left shoulder limitation with range of motion.  She does have pain throughout the shoulder with motion testing.  Positive shoulder crepitus.  Neurological:     Mental Status: She is oriented to person, place, and time.  Psychiatric:        Mood and Affect: Mood normal.     Ortho Exam  Specialty Comments:  No specialty comments available.  Imaging: No results found.   PMFS History: Patient Active Problem List   Diagnosis Date Noted  .  Other forms of scoliosis, thoracolumbar region 11/03/2016    Class: Chronic  . Gait disorder 07/05/2016  . HCAP (healthcare-associated pneumonia) 05/09/2014  . Acute respiratory failure with hypoxia ([facility]) 05/09/2014  . Pneumonia 05/09/2014  . Hypertension   . Hypothyroidism   . Breast cancer ([facility])   . Cervical stenosis (uterine cervix) 05/07/2014  . Spinal stenosis in cervical region 05/06/2014    Class: Chronic  . Cervical spondylosis without myelopathy 05/06/2014  . HNP (herniated nucleus pulposus), cervical 05/06/2014  . Adult hypothyroidism 05/03/2013  . 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 Primary malignant neoplasm of breast ([facility]) 10/24/2002   Past Medical History:  Diagnosis Date  . Arthritis   . Blood dyscrasia    bleeds and bruises easily  . Breast cancer ([facility])    left  . Gait disorder 07/05/2016  . Hepatitis     Hep A years ago  . Hypertension   . Hypothyroidism    Graves disease, age 27 yo  . Nose trouble    right nostril will hemorrhage at times  . Pneumonia   . Rosacea     Family History  Problem Relation Age of Onset  . Congestive Heart Failure Mother   . Heart disease Father   . Tongue cancer Grandchild     Past Surgical History:  Procedure Laterality Date  . ABDOMINAL HYSTERECTOMY     Age 22  . ANTERIOR CERVICAL DECOMP/DISCECTOMY FUSION N/A 05/06/2014   Procedure: ANTERIOR CERVICAL DISCECTOMY FUSION C3-4 with transgraft, local bone graft, plate and screws;  Surgeon: Jessy Oto, MD;  Location: [facility];  Service: Orthopedics;  Laterality: N/A;  . APPENDECTOMY    . BREAST LUMPECTOMY Left   . COLONOSCOPY    . EYE SURGERY Bilateral    cataracts  . LUMBAR SPINE SURGERY  2011   Removal of hematoma  . ORIF HUMERUS FRACTURE Left 2013  . THORACIC SPINE SURGERY  2011   Bone cages  . THYROIDECTOMY    . TONSILLECTOMY     Social History   Occupational History  . Occupation: Retired  Tobacco Use  . Smoking status: Former Smoker    Types:  Cigarettes    Quit date: 04/23/1962    Years since quitting: 58.0  . Smokeless tobacco: Never Used  . Tobacco comment: as teenager  Substance and Sexual Activity  . Alcohol use: Yes    Comment: Social  . Drug use: No  . Sexual activity: Not on file

## 2020-05-11 DIAGNOSIS — E871 Hypo-osmolality and hyponatremia: Secondary | ICD-10-CM | POA: Diagnosis not present

## 2020-05-11 DIAGNOSIS — E0500 Thyrotoxicosis with diffuse goiter without thyrotoxic crisis or storm: Secondary | ICD-10-CM | POA: Diagnosis not present

## 2020-05-11 DIAGNOSIS — L659 Nonscarring hair loss, unspecified: Secondary | ICD-10-CM | POA: Diagnosis not present

## 2020-05-11 DIAGNOSIS — Z92241 Personal history of systemic steroid therapy: Secondary | ICD-10-CM | POA: Diagnosis not present

## 2020-05-11 DIAGNOSIS — Z8639 Personal history of other endocrine, nutritional and metabolic disease: Secondary | ICD-10-CM | POA: Diagnosis not present

## 2020-05-12 NOTE — Progress Notes (Signed)
Office Visit Note   Patient: Karen Allison           Date of Birth: Mar 10, 1946           MRN: [id number] Visit Date: 08/07/2019              Requested by: [name], MD [address]. [place],  [place] [address] PCP: [name], MD   Assessment & Plan: Visit Diagnoses:  1. Fall, subsequent encounter   2. Closed displaced fracture of body of right scapula, initial encounter   3. Pain in right forearm     Plan: Continue sling.  Follow up with Dr. Louanne Skye in 2 weeks for recheck and repeat x-rays at that time.  Follow-Up Instructions: Return in about 2 weeks (around 08/21/2019) for WITH DR [place] RECHECK.   Orders:  Orders Placed This Encounter  Procedures  . XR Forearm Right  . XR Elbow 2 Views Right   No orders of the defined types were placed in this encounter.     Procedures: No procedures performed   Clinical Data: No additional findings.   Subjective: Chief Complaint  Patient presents with  . Right Shoulder - Follow-up    HPI Patient returns for recheck of right scapular fracture.  States she continues to have pain.  Wearing her sling.  Has not started PT or OT yet.  Objective: Vital Signs: There were no vitals taken for this visit.  Physical Exam Patient sling is on.  Right scapular tenderness.  She is neurovascular intact. Ortho Exam  Specialty Comments:  No specialty comments available.  Imaging: No results found.   PMFS History: Patient Active Problem List   Diagnosis Date Noted  . Other forms of scoliosis, thoracolumbar region 11/03/2016    Class: Chronic  . Gait disorder 07/05/2016  . HCAP (healthcare-associated pneumonia) 05/09/2014  . Acute respiratory failure with hypoxia ([facility]) 05/09/2014  . Pneumonia 05/09/2014  . Hypertension   . Hypothyroidism   . Breast cancer ([facility])   . Cervical stenosis (uterine cervix) 05/07/2014  . Spinal stenosis in cervical region 05/06/2014    Class: Chronic  . Cervical spondylosis  without myelopathy 05/06/2014  . HNP (herniated nucleus pulposus), cervical 05/06/2014  . Adult hypothyroidism 05/03/2013  . 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 Primary malignant neoplasm of breast ([facility]) 10/24/2002   Past Medical History:  Diagnosis Date  . Arthritis   . Blood dyscrasia    bleeds and bruises easily  . Breast cancer ([facility])    left  . Gait disorder 07/05/2016  . Hepatitis     Hep A years ago  . Hypertension   . Hypothyroidism    Graves disease, age 21 yo  . Nose trouble    right nostril will hemorrhage at times  . Pneumonia   . Rosacea     Family History  Problem Relation Age of Onset  . Congestive Heart Failure Mother   . Heart disease Father   . Tongue cancer Grandchild     Past Surgical History:  Procedure Laterality Date  . ABDOMINAL HYSTERECTOMY     Age 54  . ANTERIOR CERVICAL DECOMP/DISCECTOMY FUSION N/A 05/06/2014   Procedure: ANTERIOR CERVICAL DISCECTOMY FUSION C3-4 with transgraft, local bone graft, plate and screws;  Surgeon: Jessy Oto, MD;  Location: [facility];  Service: Orthopedics;  Laterality: N/A;  . APPENDECTOMY    . BREAST LUMPECTOMY Left   . COLONOSCOPY    . EYE SURGERY Bilateral    cataracts  .  LUMBAR SPINE SURGERY  2011   Removal of hematoma  . ORIF HUMERUS FRACTURE Left 2013  . THORACIC SPINE SURGERY  2011   Bone cages  . THYROIDECTOMY    . TONSILLECTOMY     Social History   Occupational History  . Occupation: Retired  Tobacco Use  . Smoking status: Former Smoker    Types: Cigarettes    Quit date: 04/23/1962    Years since quitting: 58.0  . Smokeless tobacco: Never Used  . Tobacco comment: as teenager  Substance and Sexual Activity  . Alcohol use: Yes    Comment: Social  . Drug use: No  . Sexual activity: Not on file

## 2020-05-21 DIAGNOSIS — C787 Secondary malignant neoplasm of liver and intrahepatic bile duct: Secondary | ICD-10-CM | POA: Diagnosis not present

## 2020-05-21 DIAGNOSIS — C7800 Secondary malignant neoplasm of unspecified lung: Secondary | ICD-10-CM | POA: Diagnosis not present

## 2020-05-21 DIAGNOSIS — C50912 Malignant neoplasm of unspecified site of left female breast: Secondary | ICD-10-CM | POA: Diagnosis not present

## 2020-05-21 DIAGNOSIS — Z5111 Encounter for antineoplastic chemotherapy: Secondary | ICD-10-CM | POA: Diagnosis not present

## 2020-05-28 ENCOUNTER — Encounter: Payer: Self-pay | Admitting: Specialist

## 2020-05-28 ENCOUNTER — Other Ambulatory Visit: Payer: Self-pay

## 2020-05-28 ENCOUNTER — Ambulatory Visit (INDEPENDENT_AMBULATORY_CARE_PROVIDER_SITE_OTHER): Payer: Medicare Other | Admitting: Specialist

## 2020-05-28 VITALS — BP 153/77 | HR 64 | Ht 61.5 in | Wt 137.0 lb

## 2020-05-28 DIAGNOSIS — R2689 Other abnormalities of gait and mobility: Secondary | ICD-10-CM

## 2020-05-28 DIAGNOSIS — R0781 Pleurodynia: Secondary | ICD-10-CM | POA: Diagnosis not present

## 2020-05-28 DIAGNOSIS — M48062 Spinal stenosis, lumbar region with neurogenic claudication: Secondary | ICD-10-CM | POA: Diagnosis not present

## 2020-05-28 DIAGNOSIS — S42111A Displaced fracture of body of scapula, right shoulder, initial encounter for closed fracture: Secondary | ICD-10-CM

## 2020-05-28 NOTE — Progress Notes (Signed)
Office Visit Note   Patient: Karen Allison           Date of Birth: 03-19-1946           MRN: [id number] Visit Date: 05/28/2020              Requested by: [name], MD [address] [place],  [place] [address] PCP: [name], MD   Assessment & Plan: Visit Diagnoses:  1. Rib pain on left side   2. Closed displaced fracture of body of right scapula, initial encounter   3. Balance problem   4. Spinal stenosis of lumbar region with neurogenic claudication     Plan: Avoid bending, stooping and avoid lifting weights greater than 10 lbs. Avoid prolong standing and walking. Avoid frequent bending and stooping  No lifting greater than 10 lbs. May use ice or moist heat for pain. Weight loss is of benefit. Handicap license is approved. Balance and coordination exercises. Calcium and Vitamin D supplements.  intermittant use of rib belt, to avoid pneumonia.   Follow-Up Instructions: No follow-ups on file.   Orders:  No orders of the defined types were placed in this encounter.  No orders of the defined types were placed in this encounter.     Procedures: No procedures performed   Clinical Data: No additional findings.   Subjective: Chief Complaint  Patient presents with  . Left Shoulder - Follow-up    74 year old female with history of breast ca and lumbar spinal stenosis with scoliosis, decompression then decompression and fusion complication of hematoma and cauda equina and neurogenic bladder. She has had cervical stenosis and this is better. She has had a fall with rib pain and left shoulder stiffness and pain and difficulty reaching her head and hair.   Review of Systems  Constitutional: Negative.  Negative for activity change, appetite change, chills, diaphoresis, fatigue, fever and unexpected weight change.  HENT: Negative.  Negative for congestion, dental problem, drooling, ear discharge, ear pain, facial swelling, hearing loss, mouth sores,  nosebleeds, postnasal drip, rhinorrhea, sinus pressure, sinus pain, sneezing, sore throat, tinnitus, trouble swallowing and voice change.   Eyes: Negative.  Negative for photophobia, pain, discharge, redness, itching and visual disturbance.  Respiratory: Negative.  Negative for apnea, cough, choking, chest tightness, shortness of breath, wheezing and stridor.   Cardiovascular: Negative.  Negative for chest pain, palpitations and leg swelling.  Gastrointestinal: Negative.  Negative for abdominal distention, abdominal pain, anal bleeding, blood in stool, constipation, diarrhea, nausea, rectal pain and vomiting.  Endocrine: Negative.  Negative for cold intolerance, heat intolerance, polydipsia, polyphagia and polyuria.  Genitourinary: Negative.  Negative for difficulty urinating, dyspareunia, dysuria, enuresis, flank pain, frequency, hematuria, pelvic pain and urgency.  Musculoskeletal: Negative.  Negative for arthralgias, back pain, gait problem, joint swelling, myalgias, neck pain and neck stiffness.  Skin: Negative.  Negative for color change, pallor, rash and wound.  Allergic/Immunologic: Negative for environmental allergies, food allergies and immunocompromised state.  Neurological: Negative.  Negative for dizziness, tremors, seizures, syncope, facial asymmetry, speech difficulty, weakness, light-headedness, numbness and headaches.  Hematological: Negative for adenopathy. Does not bruise/bleed easily.  Psychiatric/Behavioral: Negative.  Negative for agitation, behavioral problems, confusion, decreased concentration, dysphoric mood, hallucinations, self-injury, sleep disturbance and suicidal ideas. The patient is not nervous/anxious and is not hyperactive.      Objective: Vital Signs: BP (!) 153/77 (BP Location: Left Arm, Patient Position: Sitting)   Pulse 64   Ht 5' 1.5" (1.562 m)   Wt  137 lb (62.1 kg)   BMI 25.47 kg/m   Physical Exam Musculoskeletal:     Lumbar back: Negative right  straight leg raise test and negative left straight leg raise test.     Back Exam   Tenderness  The patient is experiencing tenderness in the lumbar.  Range of Motion  Extension: abnormal  Flexion: abnormal  Lateral bend right: abnormal  Lateral bend left: abnormal  Rotation right: abnormal  Rotation left: abnormal   Muscle Strength  Right Quadriceps:  5/5  Left Quadriceps:  5/5  Right Hamstrings:  5/5  Left Hamstrings:  5/5   Tests  Straight leg raise right: negative Straight leg raise left: negative  Reflexes  Patellar: 0/4 Achilles: 0/4 Babinski's sign: normal       Specialty Comments:  No specialty comments available.  Imaging: No results found.   PMFS History: Patient Active Problem List   Diagnosis Date Noted  . Other forms of scoliosis, thoracolumbar region 11/03/2016    Priority: High    Class: Chronic  . Spinal stenosis in cervical region 05/06/2014    Priority: High    Class: Chronic  . Cervical spondylosis without myelopathy 05/06/2014    Priority: High  . Gait disorder 07/05/2016  . HCAP (healthcare-associated pneumonia) 05/09/2014  . Acute respiratory failure with hypoxia ([facility]) 05/09/2014  . Pneumonia 05/09/2014  . Hypertension   . Hypothyroidism   . Breast cancer ([facility])   . Cervical stenosis (uterine cervix) 05/07/2014  . HNP (herniated nucleus pulposus), cervical 05/06/2014  . Adult hypothyroidism 05/03/2013  . 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 Primary malignant neoplasm of breast ([facility]) 10/24/2002   Past Medical History:  Diagnosis Date  . Arthritis   . Blood dyscrasia    bleeds and bruises easily  . Breast cancer ([facility])    left  . Gait disorder 07/05/2016  . Hepatitis     Hep A years ago  . Hypertension   . Hypothyroidism    Graves disease, age 24 yo  . Nose trouble    right nostril will hemorrhage at times  . Pneumonia   . Rosacea     Family History  Problem Relation Age of Onset  . Congestive Heart  Failure Mother   . Heart disease Father   . Tongue cancer Grandchild     Past Surgical History:  Procedure Laterality Date  . ABDOMINAL HYSTERECTOMY     Age 54  . ANTERIOR CERVICAL DECOMP/DISCECTOMY FUSION N/A 05/06/2014   Procedure: ANTERIOR CERVICAL DISCECTOMY FUSION C3-4 with transgraft, local bone graft, plate and screws;  Surgeon: Jessy Oto, MD;  Location: [facility];  Service: Orthopedics;  Laterality: N/A;  . APPENDECTOMY    . BREAST LUMPECTOMY Left   . COLONOSCOPY    . EYE SURGERY Bilateral    cataracts  . LUMBAR SPINE SURGERY  2011   Removal of hematoma  . ORIF HUMERUS FRACTURE Left 2013  . THORACIC SPINE SURGERY  2011   Bone cages  . THYROIDECTOMY    . TONSILLECTOMY     Social History   Occupational History  . Occupation: Retired  Tobacco Use  . Smoking status: Former Smoker    Types: Cigarettes    Quit date: 04/23/1962    Years since quitting: 58.1  . Smokeless tobacco: Never Used  . Tobacco comment: as teenager  Substance and Sexual Activity  . Alcohol use: Yes    Comment: Social  . Drug use: No  . Sexual activity: Not on file

## 2020-05-28 NOTE — Patient Instructions (Signed)
Avoid bending, stooping and avoid lifting weights greater than 10 lbs. Avoid prolong standing and walking. Avoid frequent bending and stooping  No lifting greater than 10 lbs. May use ice or moist heat for pain. Weight loss is of benefit. Handicap license is approved. Balance and coordination exercises. Calcium and Vitamin D supplements.  intermittant use of rib belt, to avoid pneumonia.

## 2020-05-29 DIAGNOSIS — Z23 Encounter for immunization: Secondary | ICD-10-CM | POA: Diagnosis not present

## 2020-06-05 ENCOUNTER — Encounter: Payer: Self-pay | Admitting: Pharmacist

## 2020-06-05 DIAGNOSIS — C50912 Malignant neoplasm of unspecified site of left female breast: Secondary | ICD-10-CM | POA: Insufficient documentation

## 2020-06-05 DIAGNOSIS — C787 Secondary malignant neoplasm of liver and intrahepatic bile duct: Secondary | ICD-10-CM

## 2020-06-05 HISTORY — DX: Malignant neoplasm of unspecified site of left female breast: C50.912

## 2020-06-12 ENCOUNTER — Inpatient Hospital Stay: Payer: Medicare Other | Attending: Oncology

## 2020-06-12 ENCOUNTER — Inpatient Hospital Stay: Payer: Medicare Other

## 2020-06-12 ENCOUNTER — Other Ambulatory Visit: Payer: Self-pay

## 2020-06-12 VITALS — BP 126/51 | HR 61 | Temp 98.2°F | Resp 18 | Ht 60.5 in | Wt 143.5 lb

## 2020-06-12 DIAGNOSIS — Z170 Estrogen receptor positive status [ER+]: Secondary | ICD-10-CM

## 2020-06-12 DIAGNOSIS — C50912 Malignant neoplasm of unspecified site of left female breast: Secondary | ICD-10-CM | POA: Insufficient documentation

## 2020-06-12 DIAGNOSIS — M810 Age-related osteoporosis without current pathological fracture: Secondary | ICD-10-CM | POA: Diagnosis not present

## 2020-06-12 DIAGNOSIS — Z5112 Encounter for antineoplastic immunotherapy: Secondary | ICD-10-CM | POA: Insufficient documentation

## 2020-06-12 DIAGNOSIS — C7800 Secondary malignant neoplasm of unspecified lung: Secondary | ICD-10-CM | POA: Insufficient documentation

## 2020-06-12 DIAGNOSIS — C787 Secondary malignant neoplasm of liver and intrahepatic bile duct: Secondary | ICD-10-CM | POA: Insufficient documentation

## 2020-06-12 MED ORDER — TRASTUZUMAB-DKST CHEMO 150 MG IV SOLR
6.0000 mg/kg | Freq: Once | INTRAVENOUS | Status: AC
  Administered 2020-06-12: 399 mg via INTRAVENOUS
  Filled 2020-06-12: qty 19

## 2020-06-12 MED ORDER — SODIUM CHLORIDE 0.9% FLUSH
10.0000 mL | INTRAVENOUS | Status: DC | PRN
  Filled 2020-06-12: qty 10

## 2020-06-12 MED ORDER — SODIUM CHLORIDE 0.9 % IV SOLN
Freq: Once | INTRAVENOUS | Status: AC
  Filled 2020-06-12: qty 250

## 2020-06-12 NOTE — Progress Notes (Signed)
PT IS STABLE AT DISCHARGE.

## 2020-06-12 NOTE — Patient Instructions (Signed)
[facility] Discharge Instructions for Patients Receiving Chemotherapy  Today you received the following chemotherapy agents TRASTUZUMAB  To help prevent nausea and vomiting after your treatment, we encourage you to take your nausea medication.  If you develop nausea and vomiting that is not controlled by your nausea medication, call the clinic.   BELOW ARE SYMPTOMS THAT SHOULD BE REPORTED IMMEDIATELY:  *FEVER GREATER THAN 100.5 F  *CHILLS WITH OR WITHOUT FEVER  NAUSEA AND VOMITING THAT IS NOT CONTROLLED WITH YOUR NAUSEA MEDICATION  *UNUSUAL SHORTNESS OF BREATH  *UNUSUAL BRUISING OR BLEEDING  TENDERNESS IN MOUTH AND THROAT WITH OR WITHOUT PRESENCE OF ULCERS  *URINARY PROBLEMS  *BOWEL PROBLEMS  UNUSUAL RASH Items with * indicate a potential emergency and should be followed up as soon as possible.  Feel free to call the clinic should you have any questions or concerns at The clinic phone number is [phone].  Please show the [facility] at check-in to the Emergency Department and triage nurse.

## 2020-06-23 DIAGNOSIS — Z1331 Encounter for screening for depression: Secondary | ICD-10-CM | POA: Diagnosis not present

## 2020-06-23 DIAGNOSIS — E039 Hypothyroidism, unspecified: Secondary | ICD-10-CM | POA: Diagnosis not present

## 2020-06-23 DIAGNOSIS — E871 Hypo-osmolality and hyponatremia: Secondary | ICD-10-CM | POA: Diagnosis not present

## 2020-06-23 DIAGNOSIS — R1011 Right upper quadrant pain: Secondary | ICD-10-CM | POA: Diagnosis not present

## 2020-06-23 DIAGNOSIS — Z1339 Encounter for screening examination for other mental health and behavioral disorders: Secondary | ICD-10-CM | POA: Diagnosis not present

## 2020-06-23 DIAGNOSIS — Z0000 Encounter for general adult medical examination without abnormal findings: Secondary | ICD-10-CM | POA: Diagnosis not present

## 2020-06-23 DIAGNOSIS — M7989 Other specified soft tissue disorders: Secondary | ICD-10-CM | POA: Diagnosis not present

## 2020-06-23 DIAGNOSIS — F419 Anxiety disorder, unspecified: Secondary | ICD-10-CM | POA: Diagnosis not present

## 2020-06-23 DIAGNOSIS — I10 Essential (primary) hypertension: Secondary | ICD-10-CM | POA: Diagnosis not present

## 2020-06-23 DIAGNOSIS — G894 Chronic pain syndrome: Secondary | ICD-10-CM | POA: Diagnosis not present

## 2020-06-23 DIAGNOSIS — K219 Gastro-esophageal reflux disease without esophagitis: Secondary | ICD-10-CM | POA: Diagnosis not present

## 2020-06-23 NOTE — Progress Notes (Signed)
PT STABLE AT TIME OF DISCHARGE 

## 2020-06-29 ENCOUNTER — Other Ambulatory Visit: Payer: Self-pay | Admitting: Oncology

## 2020-06-30 ENCOUNTER — Telehealth: Payer: Self-pay | Admitting: Oncology

## 2020-06-30 DIAGNOSIS — Z20822 Contact with and (suspected) exposure to covid-19: Secondary | ICD-10-CM | POA: Diagnosis not present

## 2020-06-30 NOTE — Telephone Encounter (Signed)
Per Patient 11/9 - Please reschedule Infusion from Thursday 11/11 to Fri 11/12. R/S to 11/12 at 1:00 pm.  Notified Manuela Schwartz P. Patient has been sick/tested CV (Negative)

## 2020-07-02 ENCOUNTER — Telehealth: Payer: Self-pay

## 2020-07-02 ENCOUNTER — Other Ambulatory Visit: Payer: Self-pay | Admitting: Pharmacist

## 2020-07-02 ENCOUNTER — Inpatient Hospital Stay: Payer: Medicare Other

## 2020-07-02 MED FILL — Trastuzumab-dkst For IV Soln 150 MG: INTRAVENOUS | Qty: 19 | Status: AC

## 2020-07-02 NOTE — Telephone Encounter (Addendum)
Pt wants to skip. Thanks!   ----- Message from Marvia Pickles, PA-C sent at 07/02/2020  2:28 PM EST ----- Regarding: RE: Appt change needed So are we planning for 11/15 or is she skipping? Thanks! ----- Message ----- From: Dairl Ponder, RN Sent: 07/02/2020   2:06 PM EST To: Juanetta Beets, RPH, Melodye Ped, NP, # Subject: Appt change needed                             Noted, I'm forwarding this to Manuela Schwartz, scheduling & mid level providers. Thanks! Jyoti Harju    ----- Message from Derwood Kaplan, MD sent at 07/02/2020  1:20 PM EST ----- Regarding: appt delay Yes we need to postpone Rx or even skip this one.  Pls work with scheduling & pharmacy as orders will need to be changed     Pt called to report she is still sick. Negative for COVID. Prod cough, wheezing, & general malaise/no energy. Afebrile x 2 days. She has seen PCP - is on 2 antibiotics (zpack x 5d; cephalosporin for 10days), nebulizer treatments, & cough meds. She wanted your advice regarding moving her infusion time tomorrow to maybe Monday, 07/06/2020. She will have completed the zpack by then & 5 days of the cephalosporin. [phone]

## 2020-07-02 NOTE — Telephone Encounter (Addendum)
Noted, will forward to [place] Dooms, and [facility]. Thanks! Braydin Aloi  ----- Message from Derwood Kaplan, MD sent at 07/02/2020  1:20 PM EST ----- Regarding: appt delay Yes we need to postpone Rx or even skip this one.  Pls work with scheduling & pharmacy as orders will need to be changed

## 2020-07-02 NOTE — Telephone Encounter (Signed)
Pt called to report she is still sick. Negative for COVID. Prod cough, wheezing, & general malaise/no energy. Afebrile x 2 days. She has seen PCP - is on 2 antibiotics (zpack x 5d; cephalosporin for 10days), nebulizer treatments, & cough meds. She wanted your advice regarding moving her infusion time tomorrow to maybe Monday, 07/06/2020. She will have completed the zpack by then & 5 days of the cephalosporin. [phone]

## 2020-07-02 NOTE — Telephone Encounter (Signed)
-----   Message from Derwood Kaplan, MD sent at 07/02/2020  1:20 PM EST ----- Regarding: appt delay Yes we need to postpone Rx or even skip this one.  Pls work with scheduling & pharmacy as orders will need to be changed

## 2020-07-02 NOTE — Telephone Encounter (Signed)
Noted, will forward to [place] Dooms, and [place]. Thanks! Ambra Haverstick  ----- Message from Derwood Kaplan, MD sent at 07/02/2020  1:20 PM EST ----- Regarding: appt delay Yes we need to postpone Rx or even skip this one.  Pls work with scheduling & pharmacy as orders will need to be changed

## 2020-07-03 ENCOUNTER — Telehealth: Payer: Self-pay | Admitting: Oncology

## 2020-07-03 ENCOUNTER — Inpatient Hospital Stay: Payer: Medicare Other

## 2020-07-03 DIAGNOSIS — R0602 Shortness of breath: Secondary | ICD-10-CM | POA: Diagnosis not present

## 2020-07-03 DIAGNOSIS — R059 Cough, unspecified: Secondary | ICD-10-CM | POA: Diagnosis not present

## 2020-07-03 DIAGNOSIS — R5081 Fever presenting with conditions classified elsewhere: Secondary | ICD-10-CM | POA: Diagnosis not present

## 2020-07-03 NOTE — Telephone Encounter (Signed)
Per 11/11 Staff Message - Cancel 11/12 Infusion due to patient being sick.  Per SM, pt will skip this treatment - do not reschedule

## 2020-07-09 ENCOUNTER — Telehealth: Payer: Self-pay | Admitting: Oncology

## 2020-07-09 ENCOUNTER — Ambulatory Visit: Payer: Medicare Other | Admitting: Specialist

## 2020-07-09 NOTE — Telephone Encounter (Signed)
Scheduled patient's Feb 2022 Infusion Appt's

## 2020-07-23 ENCOUNTER — Ambulatory Visit: Payer: Medicare Other

## 2020-07-24 ENCOUNTER — Other Ambulatory Visit: Payer: Self-pay

## 2020-07-24 ENCOUNTER — Inpatient Hospital Stay: Payer: Medicare Other | Attending: Oncology

## 2020-07-24 VITALS — BP 175/74 | HR 69 | Temp 98.6°F | Resp 18 | Ht 60.5 in | Wt 141.0 lb

## 2020-07-24 DIAGNOSIS — Z5112 Encounter for antineoplastic immunotherapy: Secondary | ICD-10-CM | POA: Insufficient documentation

## 2020-07-24 DIAGNOSIS — C7800 Secondary malignant neoplasm of unspecified lung: Secondary | ICD-10-CM | POA: Diagnosis not present

## 2020-07-24 DIAGNOSIS — Z170 Estrogen receptor positive status [ER+]: Secondary | ICD-10-CM

## 2020-07-24 DIAGNOSIS — C787 Secondary malignant neoplasm of liver and intrahepatic bile duct: Secondary | ICD-10-CM | POA: Insufficient documentation

## 2020-07-24 DIAGNOSIS — C50912 Malignant neoplasm of unspecified site of left female breast: Secondary | ICD-10-CM

## 2020-07-24 MED ORDER — TRASTUZUMAB-DKST CHEMO 150 MG IV SOLR
6.0000 mg/kg | Freq: Once | INTRAVENOUS | Status: AC
  Administered 2020-07-24: 399 mg via INTRAVENOUS
  Filled 2020-07-24: qty 19

## 2020-07-24 MED ORDER — SODIUM CHLORIDE 0.9 % IV SOLN
Freq: Once | INTRAVENOUS | Status: AC
  Filled 2020-07-24: qty 250

## 2020-07-24 NOTE — Progress Notes (Signed)
PT STABLE AT TIME OF DISCHARGE 

## 2020-07-24 NOTE — Patient Instructions (Signed)
[facility] Discharge Instructions for Patients Receiving Chemotherapy  Today you received the following chemotherapy agents Traztusumab  To help prevent nausea and vomiting after your treatment, we encourage you to take your nausea medication as directed.   If you develop nausea and vomiting that is not controlled by your nausea medication, call the clinic.   BELOW ARE SYMPTOMS THAT SHOULD BE REPORTED IMMEDIATELY:  *FEVER GREATER THAN 100.5 F  *CHILLS WITH OR WITHOUT FEVER  NAUSEA AND VOMITING THAT IS NOT CONTROLLED WITH YOUR NAUSEA MEDICATION  *UNUSUAL SHORTNESS OF BREATH  *UNUSUAL BRUISING OR BLEEDING  TENDERNESS IN MOUTH AND THROAT WITH OR WITHOUT PRESENCE OF ULCERS  *URINARY PROBLEMS  *BOWEL PROBLEMS  UNUSUAL RASH Items with * indicate a potential emergency and should be followed up as soon as possible.  Feel free to call the clinic should you have any questions or concerns at The clinic phone number is [phone].  Please show the [facility] at check-in to the Emergency Department and triage nurse.

## 2020-07-30 ENCOUNTER — Other Ambulatory Visit: Payer: Self-pay | Admitting: Pharmacist

## 2020-07-30 DIAGNOSIS — G4710 Hypersomnia, unspecified: Secondary | ICD-10-CM | POA: Diagnosis not present

## 2020-08-06 ENCOUNTER — Ambulatory Visit: Payer: Medicare Other | Admitting: Specialist

## 2020-08-07 ENCOUNTER — Ambulatory Visit: Payer: Medicare Other | Admitting: Specialist

## 2020-08-10 ENCOUNTER — Telehealth: Payer: Self-pay | Admitting: Oncology

## 2020-08-10 NOTE — Telephone Encounter (Signed)
Patient called to verify Follow Up Appt's

## 2020-08-13 ENCOUNTER — Inpatient Hospital Stay: Payer: Medicare Other

## 2020-08-13 ENCOUNTER — Other Ambulatory Visit: Payer: Self-pay

## 2020-08-13 VITALS — BP 133/59 | HR 60 | Temp 98.4°F | Resp 16 | Ht 60.5 in | Wt 137.8 lb

## 2020-08-13 DIAGNOSIS — Z170 Estrogen receptor positive status [ER+]: Secondary | ICD-10-CM

## 2020-08-13 DIAGNOSIS — C787 Secondary malignant neoplasm of liver and intrahepatic bile duct: Secondary | ICD-10-CM | POA: Diagnosis not present

## 2020-08-13 DIAGNOSIS — C50912 Malignant neoplasm of unspecified site of left female breast: Secondary | ICD-10-CM | POA: Diagnosis not present

## 2020-08-13 DIAGNOSIS — Z5112 Encounter for antineoplastic immunotherapy: Secondary | ICD-10-CM | POA: Diagnosis not present

## 2020-08-13 DIAGNOSIS — C7800 Secondary malignant neoplasm of unspecified lung: Secondary | ICD-10-CM | POA: Diagnosis not present

## 2020-08-13 MED ORDER — TRASTUZUMAB-DKST CHEMO 150 MG IV SOLR
6.0000 mg/kg | Freq: Once | INTRAVENOUS | Status: AC
  Administered 2020-08-13: 399 mg via INTRAVENOUS
  Filled 2020-08-13: qty 19

## 2020-08-13 MED ORDER — SODIUM CHLORIDE 0.9 % IV SOLN
Freq: Once | INTRAVENOUS | Status: AC
  Filled 2020-08-13: qty 250

## 2020-08-13 NOTE — Patient Instructions (Signed)
[facility] Discharge Instructions for Patients Receiving Chemotherapy  Today you received the following chemotherapy agents trastuzumab  To help prevent nausea and vomiting after your treatment, we encourage you to take your nausea medication.   If you develop nausea and vomiting that is not controlled by your nausea medication, call the clinic.   BELOW ARE SYMPTOMS THAT SHOULD BE REPORTED IMMEDIATELY:  *FEVER GREATER THAN 100.5 F  *CHILLS WITH OR WITHOUT FEVER  NAUSEA AND VOMITING THAT IS NOT CONTROLLED WITH YOUR NAUSEA MEDICATION  *UNUSUAL SHORTNESS OF BREATH  *UNUSUAL BRUISING OR BLEEDING  TENDERNESS IN MOUTH AND THROAT WITH OR WITHOUT PRESENCE OF ULCERS  *URINARY PROBLEMS  *BOWEL PROBLEMS  UNUSUAL RASH Items with * indicate a potential emergency and should be followed up as soon as possible.  Feel free to call the clinic should you have any questions or concerns at The clinic phone number is [phone].  Please show the [facility] at check-in to the Emergency Department and triage nurse.

## 2020-09-03 ENCOUNTER — Ambulatory Visit: Payer: Medicare Other

## 2020-09-04 ENCOUNTER — Other Ambulatory Visit: Payer: Self-pay

## 2020-09-04 ENCOUNTER — Inpatient Hospital Stay: Payer: Medicare Other | Attending: Oncology

## 2020-09-04 VITALS — BP 150/76 | HR 59 | Temp 98.1°F | Resp 18 | Ht 65.0 in | Wt 145.5 lb

## 2020-09-04 DIAGNOSIS — Z170 Estrogen receptor positive status [ER+]: Secondary | ICD-10-CM | POA: Diagnosis not present

## 2020-09-04 DIAGNOSIS — C787 Secondary malignant neoplasm of liver and intrahepatic bile duct: Secondary | ICD-10-CM | POA: Insufficient documentation

## 2020-09-04 DIAGNOSIS — Z5112 Encounter for antineoplastic immunotherapy: Secondary | ICD-10-CM | POA: Diagnosis not present

## 2020-09-04 DIAGNOSIS — C50912 Malignant neoplasm of unspecified site of left female breast: Secondary | ICD-10-CM

## 2020-09-04 DIAGNOSIS — C7800 Secondary malignant neoplasm of unspecified lung: Secondary | ICD-10-CM | POA: Diagnosis not present

## 2020-09-04 MED ORDER — SODIUM CHLORIDE 0.9% FLUSH
10.0000 mL | INTRAVENOUS | Status: DC | PRN
  Filled 2020-09-04: qty 10

## 2020-09-04 MED ORDER — SODIUM CHLORIDE 0.9 % IV SOLN
Freq: Once | INTRAVENOUS | Status: AC
  Filled 2020-09-04: qty 250

## 2020-09-04 MED ORDER — TRASTUZUMAB-DKST CHEMO 150 MG IV SOLR
6.0000 mg/kg | Freq: Once | INTRAVENOUS | Status: AC
  Administered 2020-09-04: 399 mg via INTRAVENOUS
  Filled 2020-09-04: qty 19

## 2020-09-04 NOTE — Patient Instructions (Signed)
[facility] Discharge Instructions for Patients Receiving Chemotherapy  Today you received the following chemotherapy agents Trastuzumab  To help prevent nausea and vomiting after your treatment, we encourage you to take your nausea medication as directed.   If you develop nausea and vomiting that is not controlled by your nausea medication, call the clinic.   BELOW ARE SYMPTOMS THAT SHOULD BE REPORTED IMMEDIATELY:  *FEVER GREATER THAN 100.5 F  *CHILLS WITH OR WITHOUT FEVER  NAUSEA AND VOMITING THAT IS NOT CONTROLLED WITH YOUR NAUSEA MEDICATION  *UNUSUAL SHORTNESS OF BREATH  *UNUSUAL BRUISING OR BLEEDING  TENDERNESS IN MOUTH AND THROAT WITH OR WITHOUT PRESENCE OF ULCERS  *URINARY PROBLEMS  *BOWEL PROBLEMS  UNUSUAL RASH Items with * indicate a potential emergency and should be followed up as soon as possible.  Feel free to call the clinic should you have any questions or concerns at The clinic phone number is [phone].  Please show the [facility] at check-in to the Emergency Department and triage nurse.

## 2020-09-04 NOTE — Progress Notes (Signed)
1226: PT STABLE AT TIME OF DISCHARGE

## 2020-09-23 DIAGNOSIS — I10 Essential (primary) hypertension: Secondary | ICD-10-CM | POA: Diagnosis not present

## 2020-09-23 DIAGNOSIS — Z1331 Encounter for screening for depression: Secondary | ICD-10-CM | POA: Diagnosis not present

## 2020-09-23 DIAGNOSIS — E039 Hypothyroidism, unspecified: Secondary | ICD-10-CM | POA: Diagnosis not present

## 2020-09-23 DIAGNOSIS — Z5181 Encounter for therapeutic drug level monitoring: Secondary | ICD-10-CM | POA: Diagnosis not present

## 2020-09-23 DIAGNOSIS — K219 Gastro-esophageal reflux disease without esophagitis: Secondary | ICD-10-CM | POA: Diagnosis not present

## 2020-09-23 DIAGNOSIS — F419 Anxiety disorder, unspecified: Secondary | ICD-10-CM | POA: Diagnosis not present

## 2020-09-24 ENCOUNTER — Other Ambulatory Visit: Payer: Self-pay | Admitting: Oncology

## 2020-09-25 ENCOUNTER — Other Ambulatory Visit: Payer: Self-pay

## 2020-09-25 ENCOUNTER — Inpatient Hospital Stay: Payer: Medicare Other | Attending: Oncology

## 2020-09-25 DIAGNOSIS — C50912 Malignant neoplasm of unspecified site of left female breast: Secondary | ICD-10-CM | POA: Insufficient documentation

## 2020-09-25 DIAGNOSIS — Z170 Estrogen receptor positive status [ER+]: Secondary | ICD-10-CM | POA: Diagnosis not present

## 2020-09-25 DIAGNOSIS — Z5112 Encounter for antineoplastic immunotherapy: Secondary | ICD-10-CM | POA: Insufficient documentation

## 2020-09-25 MED ORDER — TRASTUZUMAB-DKST CHEMO 150 MG IV SOLR
6.0000 mg/kg | Freq: Once | INTRAVENOUS | Status: AC
  Administered 2020-09-25: 399 mg via INTRAVENOUS
  Filled 2020-09-25: qty 19

## 2020-09-25 MED ORDER — SODIUM CHLORIDE 0.9 % IV SOLN
Freq: Once | INTRAVENOUS | Status: AC
  Filled 2020-09-25: qty 250

## 2020-09-25 NOTE — Patient Instructions (Signed)
Trastuzumab injection for infusion What is this medicine? TRASTUZUMAB (tras TOO zoo mab) is a monoclonal antibody. It is used to treat breast cancer and stomach cancer. This medicine may be used for other purposes; ask your health care provider or pharmacist if you have questions. COMMON BRAND NAME(S): Herceptin, Galvin Proffer, Trazimera What should I tell my health care provider before I take this medicine? They need to know if you have any of these conditions:  heart disease  heart failure  lung or breathing disease, like asthma  an unusual or allergic reaction to trastuzumab, benzyl alcohol, or other medications, foods, dyes, or preservatives  pregnant or trying to get pregnant  breast-feeding How should I use this medicine? This drug is given as an infusion into a vein. It is administered in a hospital or clinic by a specially trained health care professional. Talk to your pediatrician regarding the use of this medicine in children. This medicine is not approved for use in children. Overdosage: If you think you have taken too much of this medicine contact a poison control center or emergency room at once. NOTE: This medicine is only for you. Do not share this medicine with others. What if I miss a dose? It is important not to miss a dose. Call your doctor or health care professional if you are unable to keep an appointment. What may interact with this medicine? This medicine may interact with the following medications:  certain types of chemotherapy, such as daunorubicin, doxorubicin, epirubicin, and idarubicin This list may not describe all possible interactions. Give your health care provider a list of all the medicines, herbs, non-prescription drugs, or dietary supplements you use. Also tell them if you smoke, drink alcohol, or use illegal drugs. Some items may interact with your medicine. What should I watch for while using this medicine? Visit your  doctor for checks on your progress. Report any side effects. Continue your course of treatment even though you feel ill unless your doctor tells you to stop. Call your doctor or health care professional for advice if you get a fever, chills or sore throat, or other symptoms of a cold or flu. Do not treat yourself. Try to avoid being around people who are sick. You may experience fever, chills and shaking during your first infusion. These effects are usually mild and can be treated with other medicines. Report any side effects during the infusion to your health care professional. Fever and chills usually do not happen with later infusions. Do not become pregnant while taking this medicine or for 7 months after stopping it. Women should inform their doctor if they wish to become pregnant or think they might be pregnant. Women of child-bearing potential will need to have a negative pregnancy test before starting this medicine. There is a potential for serious side effects to an unborn child. Talk to your health care professional or pharmacist for more information. Do not breast-feed an infant while taking this medicine or for 7 months after stopping it. Women must use effective birth control with this medicine. What side effects may I notice from receiving this medicine? Side effects that you should report to your doctor or health care professional as soon as possible:  allergic reactions like skin rash, itching or hives, swelling of the face, lips, or tongue  chest pain or palpitations  cough  dizziness  feeling faint or lightheaded, falls  fever  general ill feeling or flu-like symptoms  signs of worsening heart failure like  breathing problems; swelling in your legs and feet  unusually weak or tired Side effects that usually do not require medical attention (report to your doctor or health care professional if they continue or are bothersome):  bone pain  changes in  taste  diarrhea  joint pain  nausea/vomiting  weight loss This list may not describe all possible side effects. Call your doctor for medical advice about side effects. You may report side effects to FDA at 1-800-FDA-1088. Where should I keep my medicine? This drug is given in a hospital or clinic and will not be stored at home. NOTE: This sheet is a summary. It may not cover all possible information. If you have questions about this medicine, talk to your doctor, pharmacist, or health care provider.  2021 Elsevier/Gold Standard (2016-08-02 14:37:52)

## 2020-10-01 ENCOUNTER — Ambulatory Visit (INDEPENDENT_AMBULATORY_CARE_PROVIDER_SITE_OTHER): Payer: Medicare Other | Admitting: Specialist

## 2020-10-01 ENCOUNTER — Encounter: Payer: Self-pay | Admitting: Specialist

## 2020-10-01 VITALS — BP 157/66 | HR 55 | Ht 61.5 in | Wt 137.0 lb

## 2020-10-01 DIAGNOSIS — R2689 Other abnormalities of gait and mobility: Secondary | ICD-10-CM

## 2020-10-01 DIAGNOSIS — M48062 Spinal stenosis, lumbar region with neurogenic claudication: Secondary | ICD-10-CM | POA: Diagnosis not present

## 2020-10-01 DIAGNOSIS — S46911D Strain of unspecified muscle, fascia and tendon at shoulder and upper arm level, right arm, subsequent encounter: Secondary | ICD-10-CM

## 2020-10-01 DIAGNOSIS — R0781 Pleurodynia: Secondary | ICD-10-CM

## 2020-10-01 DIAGNOSIS — W19XXXD Unspecified fall, subsequent encounter: Secondary | ICD-10-CM

## 2020-10-01 NOTE — Progress Notes (Signed)
Office Visit Note   Patient: Karen Allison           Date of Birth: Sep 25, 1945           MRN: [id number] Visit Date: 10/01/2020              Requested by: [name], MD [address] [place],  [place] [address] PCP: [name], MD   Assessment & Plan: Visit Diagnoses:  1. Rib pain on right side   2. Spinal stenosis of lumbar region with neurogenic claudication   3. Balance problem   4. Fall, subsequent encounter     Plan: Fall Prevention and Home Safety Falls cause injuries and can affect all age groups. It is possible to use preventive measures to significantly decrease the likelihood of falls. There are many simple measures which can make your home safer and prevent falls. OUTDOORS  Repair cracks and edges of walkways and driveways.  Remove high doorway thresholds.  Trim shrubbery on the main path into your home.  Have good outside lighting.  Clear walkways of tools, rocks, debris, and clutter.  Check that handrails are not broken and are securely fastened. Both sides of steps should have handrails.  Have leaves, snow, and ice cleared regularly.  Use sand or salt on walkways during winter months.  In the garage, clean up grease or oil spills. BATHROOM  Install night lights.  Install grab bars by the toilet and in the tub and shower.  Use non-skid mats or decals in the tub or shower.  Place a plastic non-slip stool in the shower to sit on, if needed.  Keep floors dry and clean up all water on the floor immediately.  Remove soap buildup in the tub or shower on a regular basis.  Secure bath mats with non-slip, double-sided rug tape.  Remove throw rugs and tripping hazards from the floors. BEDROOMS  Install night lights.  Make sure a bedside light is easy to reach.  Do not use oversized bedding.  Keep a telephone by your bedside.  Have a firm chair with side arms to use for getting dressed.  Remove throw rugs and tripping hazards  from the floor. KITCHEN  Keep handles on pots and pans turned toward the center of the stove. Use back burners when possible.  Clean up spills quickly and allow time for drying.  Avoid walking on wet floors.  Avoid hot utensils and knives.  Position shelves so they are not too high or low.  Place commonly used objects within easy reach.  If necessary, use a sturdy step stool with a grab bar when reaching.  Keep electrical cables out of the way.  Do not use floor polish or wax that makes floors slippery. If you must use wax, use non-skid floor wax.  Remove throw rugs and tripping hazards from the floor. STAIRWAYS  Never leave objects on stairs.  Place handrails on both sides of stairways and use them. Fix any loose handrails. Make sure handrails on both sides of the stairways are as long as the stairs.  Check carpeting to make sure it is firmly attached along stairs. Make repairs to worn or loose carpet promptly.  Avoid placing throw rugs at the top or bottom of stairways, or properly secure the rug with carpet tape to prevent slippage. Get rid of throw rugs, if possible.  Have an electrician put in a light switch at the top and bottom of the stairs. OTHER FALL PREVENTION TIPS  Wear low-heel or rubber-soled shoes that are supportive and fit well. Wear closed toe shoes.  When using a stepladder, make sure it is fully opened and both spreaders are firmly locked. Do not climb a closed stepladder.  Add color or contrast paint or tape to grab bars and handrails in your home. Place contrasting color strips on first and last steps.  Learn and use mobility aids as needed. Install an electrical emergency response system.  Turn on lights to avoid dark areas. Replace light bulbs that burn out immediately. Get light switches that glow.  Arrange furniture to create clear pathways. Keep furniture in the same place.  Firmly attach carpet with non-skid or double-sided tape.  Eliminate  uneven floor surfaces.  Select a carpet pattern that does not visually hide the edge of steps.  Be aware of all pets. OTHER HOME SAFETY TIPS  Set the water temperature for 120 F (48.8 C).  Keep emergency numbers on or near the telephone.  Keep smoke detectors on every level of the home and near sleeping areas. Document Released: 07/29/2002 Document Revised: 02/07/2012 Document Reviewed: 10/28/2011 [facility] Patient Information 2014 [place].  Hemp CBD capsules, amazon.com 5,000-7,000 mg per bottle, 60 capsules per bottle, take one capsule twice a day.  Follow-Up Instructions: No follow-ups on file.   Follow-Up Instructions: No follow-ups on file.   Orders:  No orders of the defined types were placed in this encounter.  No orders of the defined types were placed in this encounter.     Procedures: No procedures performed   Clinical Data: No additional findings.   Subjective: Chief Complaint  Patient presents with  . Right Shoulder - Pain  . Left Shoulder - Pain    75 year old female with history of falls, recently falling against the TP holder ceramic right posterior mid thoracic ribs. She is hurting all over. She has fallen multiple times and relates this to previous epidural hematoma and spine surgery at [facility] about 10 years ago. She catches her feet when walking. Bladder with straining to empty. Hurts the ribs to strain.    Review of Systems  Constitutional: Negative.  Negative for activity change, appetite change, chills, diaphoresis, fatigue, fever and unexpected weight change.  HENT: Negative.  Negative for congestion, dental problem, drooling, ear discharge, ear pain, facial swelling, hearing loss, mouth sores, nosebleeds, postnasal drip, rhinorrhea, sinus pressure, sinus pain, sneezing, sore throat, tinnitus, trouble swallowing and voice change.   Eyes: Negative.  Negative for photophobia, pain, discharge, redness, itching and visual disturbance.   Respiratory: Negative.   Cardiovascular: Positive for chest pain.  Gastrointestinal: Negative.   Endocrine: Negative.   Genitourinary: Negative.   Musculoskeletal: Positive for back pain and gait problem.  Skin: Negative.   Allergic/Immunologic: Negative.   Neurological: Positive for weakness.  Hematological: Negative.   Psychiatric/Behavioral: Negative.      Objective: Vital Signs: BP (!) 157/66 (BP Location: Right Arm, Patient Position: Sitting)   Pulse (!) 55   Ht 5' 1.5" (1.562 m)   Wt 137 lb (62.1 kg)   BMI 25.47 kg/m   Physical Exam Constitutional:      Appearance: She is well-developed and well-nourished.  HENT:     Head: Normocephalic and atraumatic.  Eyes:     Extraocular Movements: EOM normal.     Pupils: Pupils are equal, round, and reactive to light.  Pulmonary:     Effort: Pulmonary effort is normal.     Breath sounds: Normal breath sounds.  Abdominal:  General: Bowel sounds are normal.     Palpations: Abdomen is soft.  Musculoskeletal:     Cervical back: Normal range of motion and neck supple.  Skin:    General: Skin is warm and dry.  Neurological:     Mental Status: She is alert and oriented to person, place, and time.  Psychiatric:        Mood and Affect: Mood and affect normal.        Behavior: Behavior normal.        Thought Content: Thought content normal.        Judgment: Judgment normal.     Back Exam   Tenderness  The patient is experiencing tenderness in the lumbar.  Range of Motion  Extension: abnormal  Lateral bend right: abnormal  Lateral bend left: abnormal  Rotation right: abnormal  Rotation left: abnormal   Muscle Strength  Right Quadriceps:  5/5  Left Quadriceps:  5/5  Right Hamstrings:  5/5  Left Hamstrings:  5/5   Other  Toe walk: abnormal Heel walk: abnormal      Specialty Comments:  No specialty comments available.  Imaging: No results found.   PMFS History: Patient Active Problem List    Diagnosis Date Noted  . Other forms of scoliosis, thoracolumbar region 11/03/2016    Priority: High    Class: Chronic  . Spinal stenosis in cervical region 05/06/2014    Priority: High    Class: Chronic  . Cervical spondylosis without myelopathy 05/06/2014    Priority: High  . 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[facility]) 06/05/2020  . Malignant neoplasm of left breast ([facility]) 06/05/2020  . Gait disorder 07/05/2016  . HCAP (healthcare-associated pneumonia) 05/09/2014  . Acute respiratory failure with hypoxia ([facility]) 05/09/2014  . Pneumonia 05/09/2014  . Hypertension   . Hypothyroidism   . Breast cancer ([facility])   . Cervical stenosis (uterine cervix) 05/07/2014  . HNP (herniated nucleus pulposus), cervical 05/06/2014  . Adult hypothyroidism 05/03/2013  . 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 Primary malignant neoplasm of breast ([facility]) 10/24/2002   Past Medical History:  Diagnosis Date  . Arthritis   . Blood dyscrasia    bleeds and bruises easily  . Breast cancer ([facility])    left  . Gait disorder 07/05/2016  . Hepatitis     Hep A years ago  . Hypertension   . Hypothyroidism    Graves disease, age 66 yo  . Nose trouble    right nostril will hemorrhage at times  . Pneumonia   . Rosacea   . 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[facility])     Family History  Problem Relation Age of Onset  . Congestive Heart Failure Mother   . Heart disease Father   . Tongue cancer Grandchild     Past Surgical History:  Procedure Laterality Date  . ABDOMINAL HYSTERECTOMY     Age 31  . ANTERIOR CERVICAL DECOMP/DISCECTOMY FUSION N/A 05/06/2014   Procedure: ANTERIOR CERVICAL DISCECTOMY FUSION C3-4 with transgraft, local bone graft, plate and screws;  Surgeon: Jessy Oto, MD;  Location: [facility];  Service: Orthopedics;  Laterality: N/A;  . APPENDECTOMY    . BREAST LUMPECTOMY Left   . COLONOSCOPY    . EYE SURGERY Bilateral    cataracts  . LUMBAR SPINE SURGERY  2011   Removal of hematoma  .  ORIF HUMERUS FRACTURE Left 2013  . THORACIC SPINE SURGERY  2011   Bone cages  . THYROIDECTOMY    . TONSILLECTOMY  Social History   Occupational History  . Occupation: Retired  Tobacco Use  . Smoking status: Former Smoker    Types: Cigarettes    Quit date: 04/23/1962    Years since quitting: 58.4  . Smokeless tobacco: Never Used  . Tobacco comment: as teenager  Substance and Sexual Activity  . Alcohol use: Yes    Comment: Social  . Drug use: No  . Sexual activity: Not on file

## 2020-10-01 NOTE — Patient Instructions (Signed)
Plan: Fall Prevention and Home Safety Falls cause injuries and can affect all age groups. It is possible to use preventive measures to significantly decrease the likelihood of falls. There are many simple measures which can make your home safer and prevent falls. OUTDOORS  Repair cracks and edges of walkways and driveways.  Remove high doorway thresholds.  Trim shrubbery on the main path into your home.  Have good outside lighting.  Clear walkways of tools, rocks, debris, and clutter.  Check that handrails are not broken and are securely fastened. Both sides of steps should have handrails.  Have leaves, snow, and ice cleared regularly.  Use sand or salt on walkways during winter months.  In the garage, clean up grease or oil spills. BATHROOM  Install night lights.  Install grab bars by the toilet and in the tub and shower.  Use non-skid mats or decals in the tub or shower.  Place a plastic non-slip stool in the shower to sit on, if needed.  Keep floors dry and clean up all water on the floor immediately.  Remove soap buildup in the tub or shower on a regular basis.  Secure bath mats with non-slip, double-sided rug tape.  Remove throw rugs and tripping hazards from the floors. BEDROOMS  Install night lights.  Make sure a bedside light is easy to reach.  Do not use oversized bedding.  Keep a telephone by your bedside.  Have a firm chair with side arms to use for getting dressed.  Remove throw rugs and tripping hazards from the floor. KITCHEN  Keep handles on pots and pans turned toward the center of the stove. Use back burners when possible.  Clean up spills quickly and allow time for drying.  Avoid walking on wet floors.  Avoid hot utensils and knives.  Position shelves so they are not too high or low.  Place commonly used objects within easy reach.  If necessary, use a sturdy step stool with a grab bar when reaching.  Keep electrical cables out of  the way.  Do not use floor polish or wax that makes floors slippery. If you must use wax, use non-skid floor wax.  Remove throw rugs and tripping hazards from the floor. STAIRWAYS  Never leave objects on stairs.  Place handrails on both sides of stairways and use them. Fix any loose handrails. Make sure handrails on both sides of the stairways are as long as the stairs.  Check carpeting to make sure it is firmly attached along stairs. Make repairs to worn or loose carpet promptly.  Avoid placing throw rugs at the top or bottom of stairways, or properly secure the rug with carpet tape to prevent slippage. Get rid of throw rugs, if possible.  Have an electrician put in a light switch at the top and bottom of the stairs. OTHER FALL PREVENTION TIPS  Wear low-heel or rubber-soled shoes that are supportive and fit well. Wear closed toe shoes.  When using a stepladder, make sure it is fully opened and both spreaders are firmly locked. Do not climb a closed stepladder.  Add color or contrast paint or tape to grab bars and handrails in your home. Place contrasting color strips on first and last steps.  Learn and use mobility aids as needed. Install an electrical emergency response system.  Turn on lights to avoid dark areas. Replace light bulbs that burn out immediately. Get light switches that glow.  Arrange furniture to create clear pathways. Keep furniture in the same  place.  Firmly attach carpet with non-skid or double-sided tape.  Eliminate uneven floor surfaces.  Select a carpet pattern that does not visually hide the edge of steps.  Be aware of all pets. OTHER HOME SAFETY TIPS  Set the water temperature for 120 F (48.8 C).  Keep emergency numbers on or near the telephone.  Keep smoke detectors on every level of the home and near sleeping areas. Document Released: 07/29/2002 Document Revised: 02/07/2012 Document Reviewed: 10/28/2011 [facility] Patient Information 2014  [place].  Hemp CBD capsules, amazon.com 5,000-7,000 mg per bottle, 60 capsules per bottle, take one capsule twice a day.

## 2020-10-05 ENCOUNTER — Ambulatory Visit: Payer: Medicare Other | Admitting: Specialist

## 2020-10-16 ENCOUNTER — Other Ambulatory Visit: Payer: Self-pay

## 2020-10-16 ENCOUNTER — Inpatient Hospital Stay: Payer: Medicare Other

## 2020-10-16 DIAGNOSIS — Z170 Estrogen receptor positive status [ER+]: Secondary | ICD-10-CM | POA: Diagnosis not present

## 2020-10-16 DIAGNOSIS — Z5112 Encounter for antineoplastic immunotherapy: Secondary | ICD-10-CM | POA: Diagnosis not present

## 2020-10-16 DIAGNOSIS — C50912 Malignant neoplasm of unspecified site of left female breast: Secondary | ICD-10-CM

## 2020-10-16 MED ORDER — TRASTUZUMAB-DKST CHEMO 150 MG IV SOLR
6.0000 mg/kg | Freq: Once | INTRAVENOUS | Status: AC
  Administered 2020-10-16: 399 mg via INTRAVENOUS
  Filled 2020-10-16: qty 19

## 2020-10-16 MED ORDER — SODIUM CHLORIDE 0.9 % IV SOLN
Freq: Once | INTRAVENOUS | Status: AC
  Filled 2020-10-16: qty 250

## 2020-10-16 NOTE — Patient Instructions (Signed)
[facility] Discharge Instructions for Patients Receiving Chemotherapy  Today you received the following chemotherapy agents Trastuzmab  To help prevent nausea and vomiting after your treatment, we encourage you to take your nausea medication as directed.   If you develop nausea and vomiting that is not controlled by your nausea medication, call the clinic.   BELOW ARE SYMPTOMS THAT SHOULD BE REPORTED IMMEDIATELY:  *FEVER GREATER THAN 100.5 F  *CHILLS WITH OR WITHOUT FEVER  NAUSEA AND VOMITING THAT IS NOT CONTROLLED WITH YOUR NAUSEA MEDICATION  *UNUSUAL SHORTNESS OF BREATH  *UNUSUAL BRUISING OR BLEEDING  TENDERNESS IN MOUTH AND THROAT WITH OR WITHOUT PRESENCE OF ULCERS  *URINARY PROBLEMS  *BOWEL PROBLEMS  UNUSUAL RASH Items with * indicate a potential emergency and should be followed up as soon as possible.  Feel free to call the clinic should you have any questions or concerns at The clinic phone number is [phone].  Please show the [name] at check-in to the Emergency Department and triage nurse.

## 2020-10-16 NOTE — Progress Notes (Signed)
Patient stable at d/c 1239

## 2020-10-16 NOTE — Progress Notes (Signed)
PT STABLE AT TIME OF DISCHARGE 

## 2020-10-22 ENCOUNTER — Telehealth: Payer: Self-pay | Admitting: Oncology

## 2020-10-22 NOTE — Telephone Encounter (Signed)
10/22/20 spoke with patients husband and resched appt

## 2020-11-02 NOTE — Progress Notes (Signed)
[facility]  [address] [place],  [place]  [address] [phone]  Clinic Day:  11/04/2020  Referring physician: [name], MD  This document serves as a record of services personally performed by Hosie Poisson, MD. It was created on their behalf by Curry,Lauren E, a trained medical scribe. The creation of this record is based on the scribe's personal observations and the provider's statements to them.  CHIEF COMPLAINT:  CC: Stage IV breast cancer  Current Treatment:  Trastuzumab IV every 3 weeks   HISTORY OF PRESENT ILLNESS:  Karen Allison is a 75 y.o. 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  Estrogen and progesterone receptors were positive and her 2 was positive, and scans revealed liver metastases.  She was placed on a clinical trial at [facility] consisting of letrozole and trastuzumab, but she stopped the letrozole after several months because of severe arthralgias.  She also has known osteoarthritis, degenerative disc disease, and osteoporosis, and has had several back surgeries.  She is been on single agent trastuzumab now for over 12 years with good control of her disease.  This was stopped temporarily in 2011 but a PET scan a few months later revealed a new 9 mm lung nodule felt to represent recurrent disease, which did resolve after she was placed back on the trastuzumab.  She sees Dr. Micah Noel at [facility] yearly now, usually in July and he does scans and labs at that time.  We do perform periodic echocardiograms and her last one was in July.  She did have her last bone density scan in July of 2015, with no change, and receives Reclast every 6 months.  She had cervical disc surgery in September of 2016, and then developed a hospital-acquired pneumonia which took a long time to clear.  She had colonoscopy  within the last 2 years.  She had a fall in September with 2 fractured ribs.  Her medication list was reviewed.  When I review her Reclast doses, she received this every 6 months from July of 2014 to January of 2016, and then yearly, with the last dose in January of 2017.  She was having some tooth sensitivity but this is no longer an issue.  I did review her family history and the patient had endometrial cancer in her 89's and breast cancer in her 55's. She has a first cousin who had breast cancer and a paternal grandmother who had liver cancer, possibly metastatic.  An aunt had liver cancer.  A step uncle had multiple daughters with breast cancer.  The patient was seen in November of 2017 because of a tenderness and possible lump in the right upper outer quadrant.  A mammogram and ultrasound just revealed normal fibro fatty tissue.  She was evaluated at [facility] by Dr. Micah Noel in July of 2017 and there were some concerns about her scans.  There appeared to be a rim enhancing fluid collection intramuscular in the left vastus lateralis muscle measuring 2 cm which appeared new.  There was a new right posterior 6th rib expansile sclerotic lesion measuring 2.3 cm as well as an old healed right 5th rib fracture which had been stable from the year before.  She did have an MRI of the hip in August which was nonspecific with extensive postoperative findings in the lumbar spine as well as osteoarthritis, osteophytes,  and degenerative changes.  There was a simple appearing fluid collection interposed between the iliotibial band and proximal margin of the vastus lateralis consistent with a small ganglion cyst or prior shearing injury.  We repeated the CT of the chest in January of 2018 to follow up on the expansile lesion of the rib and this remained stable, and consistent with posttraumatic fracture.  She also fractured her right scapula after a fall and she has fractured several ribs on the right.  Her last ECHO in 2021  remains normal.  INTERVAL HISTORY:  Karen Allison is here for annual follow up and states that she has had three falls within, one resulting in broken ribs.  She is using a walker to ambulate.  Annual bilateral mammogram from March 2021 was clear, and we will schedule her mammogram for this year.  Bone density scan from March 2021 revealed osteoporosis with a T-score of -2.5 of the right forearm, previously -2.0.  Left femur neck measures -1.5, previously -1.6.  Dual femur total mean is stable at -1.4.  She continues yearly Reclast since July 2014.  CT imaging from July 2021 at [facility] was negative with no new or enlarging metastatic disease.  Her  appetite is good, and she has lost 2 pounds since her last visit.  She denies fever, chills or other signs of infection.  She denies nausea, vomiting, bowel issues, or abdominal pain.  She denies sore throat, cough, dyspnea, or chest pain.  REVIEW OF SYSTEMS:  Review of Systems  Constitutional: Negative.  Negative for appetite change, chills, fatigue, fever and unexpected weight change.  HENT:  Negative.   Eyes: Negative.   Respiratory: Negative.  Negative for chest tightness, cough, hemoptysis, shortness of breath and wheezing.   Cardiovascular: Negative.  Negative for chest pain, leg swelling and palpitations.  Gastrointestinal: Negative.  Negative for abdominal distention, abdominal pain, blood in stool, constipation, diarrhea, nausea and vomiting.  Endocrine: Negative.   Genitourinary: Negative.  Negative for difficulty urinating, dysuria, frequency and hematuria.   Musculoskeletal: Positive for gait problem (with three falls this past month, using a walker). Negative for arthralgias, back pain, flank pain and myalgias.       She has osteoporosis with many prior fractures  Skin: Negative.   Neurological: Positive for gait problem (with three falls this past month, using a walker). Negative for dizziness, extremity weakness, headaches, light-headedness,  numbness, seizures and speech difficulty.  Hematological: Negative.   Psychiatric/Behavioral: Negative.  Negative for depression and sleep disturbance. The patient is not nervous/anxious.      VITALS:  Blood pressure (!) 147/69, pulse 64, temperature 98.1 F (36.7 C), temperature source Oral, resp. rate 18, height 5' 1.5" (1.562 m), weight 141 lb 3.2 oz (64 kg), SpO2 98 %.  Wt Readings from Last 3 Encounters:  11/04/20 141 lb 3.2 oz (64 kg)  10/01/20 137 lb (62.1 kg)  09/04/20 145 lb 8 oz (66 kg)    Body mass index is 26.25 kg/m.  Performance status (ECOG): 1 - Symptomatic but completely ambulatory  PHYSICAL EXAM:  Physical Exam Constitutional:      General: She is not in acute distress.    Appearance: Normal appearance. She is normal weight.  HENT:     Head: Normocephalic and atraumatic.  Eyes:     General: No scleral icterus.    Extraocular Movements: Extraocular movements intact.     Conjunctiva/sclera: Conjunctivae normal.     Pupils: Pupils are equal, round, and reactive to light.  Cardiovascular:  Rate and Rhythm: Normal rate and regular rhythm.     Pulses: Normal pulses.     Heart sounds: Normal heart sounds. No murmur heard. No friction rub. No gallop.   Pulmonary:     Effort: Pulmonary effort is normal. No respiratory distress.     Breath sounds: Normal breath sounds.  Chest:  Breasts:     Right: Normal.     Left: Normal.      Comments: She has a fading scar over the left nipple areolar complex.  No masses in either breast. Abdominal:     General: Bowel sounds are normal. There is no distension.     Palpations: Abdomen is soft. There is no hepatomegaly, splenomegaly or mass.     Tenderness: There is no abdominal tenderness.  Musculoskeletal:        General: Normal range of motion.     Cervical back: Normal range of motion and neck supple.     Right lower leg: No edema.     Left lower leg: No edema.  Lymphadenopathy:     Cervical: No cervical  adenopathy.  Skin:    General: Skin is warm and dry.  Neurological:     General: No focal deficit present.     Mental Status: She is alert and oriented to person, place, and time. Mental status is at baseline.  Psychiatric:        Mood and Affect: Mood normal.        Behavior: Behavior normal.        Thought Content: Thought content normal.        Judgment: Judgment normal.     LABS:   CBC Latest Ref Rng & Units 05/11/2014 05/09/2014 04/23/2014  WBC 4.0 - 10.5 K/uL 6.4 13.6(H) 7.7  Hemoglobin 12.0 - 15.0 g/dL 10.8(L) 12.0 13.2  Hematocrit 36.0 - 46.0 % 32.0(L) 35.5(L) 39.4  Platelets 150 - 400 K/uL 166 151 184   CMP Latest Ref Rng & Units 05/12/2014 05/09/2014 04/23/2014  Glucose 70 - 99 mg/dL - 94 96  BUN 6 - 23 mg/dL - 7 11  Creatinine 0.50 - 1.10 mg/dL 0.56 0.65 0.78  Sodium 137 - 147 mEq/L - 140 140  Potassium 3.7 - 5.3 mEq/L - 3.8 4.4  Chloride 96 - 112 mEq/L - 99 101  CO2 19 - 32 mEq/L - 28 28  Calcium 8.4 - 10.5 mg/dL - 8.7 9.0  Total Protein 6.0 - 8.3 g/dL - 6.6 7.1  Total Bilirubin 0.3 - 1.2 mg/dL - 0.4 0.3  Alkaline Phos 39 - 117 U/L - 64 68  AST 0 - 37 U/L - 29 25  ALT 0 - 35 U/L - 16 16     Lab Results  Component Value Date   LDH 230 03/10/2006     STUDIES:   Procedure: CT Abdomen and Pelvis with IV Contrast 03/05/2020  Comparison: 02/28/2019   Indication: Invasive breast cancer, stage IV, assess treatment response,  C50.912 Malignant neoplasm of unspecified site of left female breast  (CMS-HCC), C78.7 Secondary malignant neoplasm of liver and intrahepatic  bile duct (CMS-HCC)   Technique: CT imaging was performed of the chest, abdomen, and pelvis  following the administration of intravenous contrast. Iodinated contrast  wasused due to the indications for the examination, to improve disease  detection and to further define anatomy.  3-D maximal intensity projection  (MIP) reconstructions of the chest were performed to potentially increase  study  sensitivity. Coronal and sagittal images were also generated  and  reviewed.   Findings:  Chest:   - Chest wall and Thoracic Inlet: No masses or lymphadenopathy.   - Mediastinum and Hila: No masses or lymphadenopathy.   - Thoracic Vessels: Normal caliber of the thoracic aorta and main pulmonary  artery.   - Heart and Pericardium: Normal heart size. No pericardial effusion.  Moderate calcified atherosclerotic plaque in the coronary arteries.   - Lungs and Airways: No suspicious nodules or opacities.   - Pleura: No pleuraleffusions.    Abdomen and pelvis:   - Liver: Unchanged low-attenuation lesion in the right anterior liver,  similar over multiple priors. The portal and hepatic veins are patent.   - Biliary and Gallbladder: No intrahepatic or extrahepatic bile duct  dilatation. The gallbladder is normal   - Spleen: Normal in appearance.    - Pancreas: Normal in appearance.   - Adrenal Glands: Normal in appearance.   - Kidneys: No suspicious renal lesions. No hydronephrosis.   - Abdominal and Pelvic Vasculature: No abdominal aortic aneurysm.   - Gastrointestinal Tract: No abnormal dilation or wall thickening.   - Peritoneum/Mesentery/Retroperitoneum: No free fluid. No free  intraperitoneal air.   - Lymph Nodes: No retroperitoneal, mesenteric, pelvic, or inguinal  lymphadenopathy.    - Bladder: Normal in appearance.   - Pelvic Organs: Hysterectomy. No adnexal masses.   - Body Wall: Unremarkable.   - Musculoskeletal: No aggressive appearing osseous lesions. Advanced  degenerative change in the left glenohumeral joint, similar to prior.  Unchanged expansile lesion of the right posterior sixth rib. Posterior  lumbar spinal decompression and fusion.   Impression:  No new or enlarging metastatic disease.    EXAM: 11/13/2019 DIGITAL SCREENING BILATERAL MAMMOGRAM WITH TOMO AND CAD  COMPARISON:  Previous exam(s).  ACR Breast Density Category b: There  are scattered areas of fibroglandular density.  FINDINGS: There are no findings suspicious for malignancy. Images were processed with CAD.  IMPRESSION: No mammographic evidence of malignancy. A result letter of this screening mammogram will be mailed directly to the patient.  RECOMMENDATION: Screening mammogram in one year. (Code:[id number]-01Y)   EXAM: 11/13/2019 DUAL X-RAY ABSORPTIOMETRY (DXA) FOR BONE MINERAL DENSITY  IMPRESSION: Referring Physician:  Derwood Kaplan Your patient completed a BMD test using Lunar IDXA DXA system ( analysis version: 16 ) manufactured by [employer]. Technologist: AW PATIENT: Name: Karen Allison, Karen Allison Patient ID: [id number] Birth Date: 04/16/1946 Height: 61.5 in. Sex: Female Measured: 11/13/2019 Weight: 140.0 lbs. Indications: Advanced Age, Bilateral Ovariectomy (65.51), Caucasian, Estrogen Deficient, Family History of Osteoporosis, Gabapentin, Height Loss (781.91), History of Fracture (Adult) (V15.51), Hypothyroid, Hysterectomy, Levothyroxine, Postmenopausal Fractures: Humerus, Tibia Treatments: Calcium (E943.0), ERT (V07.4), Reclast, Vitamin D (E933.5)  ASSESSMENT: The BMD measured at Forearm Radius 33% is 0.668 g/cm2 with a T-score of -2.5. This patient is considered osteoporotic according to [facility]) criteria. The scan quality is good. Lumbar spine was not utilized due to advanced degenerative changes.  Site Region Measured Date Measured Age YA BMD Significant CHANGE T-score Right Forearm Radius 33% 11/13/2019 75.5 -2.5 0.668 g/cm2 Right Forearm Radius 33% 04/13/2016 71.9 -2.0 0.707 g/cm2  DualFemur Neck Left 11/13/2019 75.5 -1.3 0.830 g/cm2 DualFemur Neck Left 04/13/2016 71.9 -1.4 0.822 g/cm2  DualFemur Total Mean 11/13/2019 75.5 -1.4 0.837 g/cm2 DualFemur Total Mean 04/13/2016 71.9 -1.4 0.834 g/cm2   Allergies:  Allergies  Allergen Reactions  . Zithromax [Azithromycin] Hives    Current  Medications: Current Outpatient Medications  Medication Sig Dispense Refill  . atenolol (TENORMIN) 25 MG  tablet Take 25 mg by mouth daily.     . B Complex Vitamins (B COMPLEX PO) Take by mouth.    . Calcium Carbonate-Vitamin D (CALTRATE 600+D PO) Take 1 tablet by mouth 2 (two) times daily.    . celecoxib (CELEBREX) 200 MG capsule Take 200 mg by mouth daily.     . diazepam (VALIUM) 10 MG tablet Take 10 mg by mouth at bedtime.     . diclofenac sodium (VOLTAREN) 1 % GEL Apply 4 g topically 4 (four) times daily. 350 g 3  . docusate sodium (COLACE) 100 MG capsule Take 200 mg by mouth 2 (two) times daily as needed for mild constipation.     Marland Kitchen doxycycline (VIBRAMYCIN) 50 MG capsule     . FLUAD 0.5 ML SUSY See admin instructions.  0  . gabapentin (NEURONTIN) 600 MG tablet Take 600-1,200 mg by mouth 4 (four) times daily - after meals and at bedtime. 1 tablet (600 mg) 3 times a day and 2 tablets(1200mg ) at bedtime    . HYDROcodone-acetaminophen (NORCO) 10-325 MG tablet Take 1 tablet by mouth every 6 (six) hours as needed.    Marland Kitchen ketoconazole (NIZORAL) 2 % shampoo     . levothyroxine (SYNTHROID, LEVOTHROID) 50 MCG tablet Take 50 mcg by mouth daily.    . methocarbamol (ROBAXIN) 500 MG tablet Take 500 mg by mouth 4 (four) times daily.    . metroNIDAZOLE (METROCREAM) 0.75 % cream Apply topically.    . Multiple Vitamin (MULTIVITAMIN WITH MINERALS) TABS Take 1 tablet by mouth daily.    Marland Kitchen omeprazole (PRILOSEC) 20 MG capsule Take 1 capsule (20 mg total) by mouth 2 (two) times daily before a meal. 60 capsule 3  . promethazine (PHENERGAN) 25 MG tablet Take 25 mg by mouth every 6 (six) hours as needed for nausea or vomiting.    . sennosides-docusate sodium (SENOKOT-S) 8.6-50 MG tablet Take 1 tablet by mouth daily.    . trastuzumab (HERCEPTIN) 440 MG injection 440 mg by Intravenous (Continuous Infusion) route every 21 ( twenty-one) days. infusion every 3 weeks    . zoledronic acid (RECLAST) 5 MG/100ML SOLN injection  Inject into the vein.     No current facility-administered medications for this visit.     ASSESSMENT & PLAN:   Assessment:   1.  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 diagnosed in December 2003.  Subsequent x-rays have shown possible bone metastases as well.  She was treated for a brief time with hormonal therapy but was unable to tolerate that.  She currently is on trastuzumab every 3 weeks and continues to have her disease under control.  2.  Osteoporosis, for which she is on yearly Reclast since July 2014.  She has had many fractures of spine, ribs, scapula.    Plan: We will draw CBC and CMP today, and if all is well, she will proceed with trastuzumab on March 18th as well as annual Reclast.  We will schedule her for ECHO and annual bilateral mammogram now.  She will continue follow-up yearly at [facility] in the summer with scans.  I will see her back in 1 year with bilateral mammogram, ECHO and bone density.  We can then decide whether to continue with annual Reclast.  She knows to call sooner if problems arise regarding her breast cancer or its treatment.  She understands and agrees with this plan of care.   I provided 20  minutes of face-to-face time during this this encounter and > 50% was spent counseling as documented under my assessment and plan.    Derwood Kaplan, MD [facility] AT [facility] [address] [place] [address] Dept: [phone] Dept Fax: [phone]   I, Rita Ohara, am acting as scribe for Derwood Kaplan, MD  I have reviewed this report as typed by the medical scribe, and it is complete and accurate.  Hermina Barters

## 2020-11-04 ENCOUNTER — Other Ambulatory Visit: Payer: Self-pay | Admitting: Oncology

## 2020-11-04 ENCOUNTER — Encounter: Payer: Self-pay | Admitting: Oncology

## 2020-11-04 ENCOUNTER — Ambulatory Visit: Payer: Medicare Other | Admitting: Oncology

## 2020-11-04 ENCOUNTER — Other Ambulatory Visit: Payer: Self-pay | Admitting: Hematology and Oncology

## 2020-11-04 ENCOUNTER — Inpatient Hospital Stay: Payer: Medicare Other | Attending: Oncology | Admitting: Oncology

## 2020-11-04 ENCOUNTER — Other Ambulatory Visit: Payer: Self-pay

## 2020-11-04 ENCOUNTER — Inpatient Hospital Stay: Payer: Medicare Other

## 2020-11-04 DIAGNOSIS — D649 Anemia, unspecified: Secondary | ICD-10-CM | POA: Diagnosis not present

## 2020-11-04 DIAGNOSIS — C778 Secondary and unspecified malignant neoplasm of lymph nodes of multiple regions: Secondary | ICD-10-CM | POA: Insufficient documentation

## 2020-11-04 DIAGNOSIS — C50912 Malignant neoplasm of unspecified site of left female breast: Secondary | ICD-10-CM

## 2020-11-04 DIAGNOSIS — C787 Secondary malignant neoplasm of liver and intrahepatic bile duct: Secondary | ICD-10-CM | POA: Insufficient documentation

## 2020-11-04 DIAGNOSIS — R269 Unspecified abnormalities of gait and mobility: Secondary | ICD-10-CM | POA: Insufficient documentation

## 2020-11-04 DIAGNOSIS — T451X5D Adverse effect of antineoplastic and immunosuppressive drugs, subsequent encounter: Secondary | ICD-10-CM

## 2020-11-04 DIAGNOSIS — Z79899 Other long term (current) drug therapy: Secondary | ICD-10-CM | POA: Insufficient documentation

## 2020-11-04 DIAGNOSIS — M810 Age-related osteoporosis without current pathological fracture: Secondary | ICD-10-CM

## 2020-11-04 DIAGNOSIS — M8000XG Age-related osteoporosis with current pathological fracture, unspecified site, subsequent encounter for fracture with delayed healing: Secondary | ICD-10-CM

## 2020-11-04 DIAGNOSIS — Z1231 Encounter for screening mammogram for malignant neoplasm of breast: Secondary | ICD-10-CM

## 2020-11-04 DIAGNOSIS — M1990 Unspecified osteoarthritis, unspecified site: Secondary | ICD-10-CM | POA: Insufficient documentation

## 2020-11-04 DIAGNOSIS — Z5112 Encounter for antineoplastic immunotherapy: Secondary | ICD-10-CM | POA: Insufficient documentation

## 2020-11-04 HISTORY — DX: Age-related osteoporosis without current pathological fracture: M81.0

## 2020-11-04 LAB — BASIC METABOLIC PANEL
BUN: 13 (ref 4–21)
CO2: 33 — AB (ref 13–22)
Chloride: 92 — AB (ref 99–108)
Creatinine: 0.7 (ref 0.5–1.1)
Glucose: 101
Potassium: 4.1 (ref 3.4–5.3)
Sodium: 130 — AB (ref 137–147)

## 2020-11-04 LAB — HEPATIC FUNCTION PANEL
ALT: 19 (ref 7–35)
AST: 32 (ref 13–35)
Alkaline Phosphatase: 81 (ref 25–125)
Bilirubin, Total: 0.5

## 2020-11-04 LAB — CBC AND DIFFERENTIAL
HCT: 42 (ref 36–46)
Hemoglobin: 13.8 (ref 12.0–16.0)
Neutrophils Absolute: 3.6
Platelets: 172 (ref 150–399)
WBC: 5.9

## 2020-11-04 LAB — CBC: RBC: 4.36 (ref 3.87–5.11)

## 2020-11-04 LAB — COMPREHENSIVE METABOLIC PANEL
Albumin: 4.4 (ref 3.5–5.0)
Calcium: 8.7 (ref 8.7–10.7)

## 2020-11-05 ENCOUNTER — Telehealth: Payer: Self-pay

## 2020-11-05 NOTE — Telephone Encounter (Signed)
-----   Message from Derwood Kaplan, MD sent at 11/04/2020  5:38 PM EDT ----- Regarding: call pt Tell her labs look good except for low salt level

## 2020-11-06 ENCOUNTER — Inpatient Hospital Stay: Payer: Medicare Other

## 2020-11-06 ENCOUNTER — Other Ambulatory Visit: Payer: Self-pay

## 2020-11-06 VITALS — BP 144/57 | HR 54 | Temp 97.7°F | Resp 18 | Ht 61.5 in | Wt 140.0 lb

## 2020-11-06 DIAGNOSIS — Z5112 Encounter for antineoplastic immunotherapy: Secondary | ICD-10-CM | POA: Diagnosis not present

## 2020-11-06 DIAGNOSIS — M8000XG Age-related osteoporosis with current pathological fracture, unspecified site, subsequent encounter for fracture with delayed healing: Secondary | ICD-10-CM

## 2020-11-06 DIAGNOSIS — C787 Secondary malignant neoplasm of liver and intrahepatic bile duct: Secondary | ICD-10-CM | POA: Diagnosis not present

## 2020-11-06 DIAGNOSIS — Z79899 Other long term (current) drug therapy: Secondary | ICD-10-CM | POA: Diagnosis not present

## 2020-11-06 DIAGNOSIS — M1990 Unspecified osteoarthritis, unspecified site: Secondary | ICD-10-CM | POA: Diagnosis not present

## 2020-11-06 DIAGNOSIS — R269 Unspecified abnormalities of gait and mobility: Secondary | ICD-10-CM | POA: Diagnosis not present

## 2020-11-06 DIAGNOSIS — C50912 Malignant neoplasm of unspecified site of left female breast: Secondary | ICD-10-CM

## 2020-11-06 DIAGNOSIS — C778 Secondary and unspecified malignant neoplasm of lymph nodes of multiple regions: Secondary | ICD-10-CM | POA: Diagnosis not present

## 2020-11-06 DIAGNOSIS — M810 Age-related osteoporosis without current pathological fracture: Secondary | ICD-10-CM | POA: Diagnosis not present

## 2020-11-06 MED ORDER — TRASTUZUMAB-ANNS CHEMO 150 MG IV SOLR
6.0000 mg/kg | Freq: Once | INTRAVENOUS | Status: AC
  Administered 2020-11-06: 399 mg via INTRAVENOUS
  Filled 2020-11-06: qty 19

## 2020-11-06 MED ORDER — SODIUM CHLORIDE 0.9 % IV SOLN
Freq: Once | INTRAVENOUS | Status: DC
  Filled 2020-11-06: qty 250

## 2020-11-06 MED ORDER — ZOLEDRONIC ACID 4 MG/100ML IV SOLN
4.0000 mg | Freq: Once | INTRAVENOUS | Status: AC
  Administered 2020-11-06: 4 mg via INTRAVENOUS

## 2020-11-06 MED ORDER — ZOLEDRONIC ACID 4 MG/100ML IV SOLN
INTRAVENOUS | Status: AC
  Filled 2020-11-06: qty 100

## 2020-11-06 MED ORDER — SODIUM CHLORIDE 0.9 % IV SOLN
Freq: Once | INTRAVENOUS | Status: AC
  Filled 2020-11-06: qty 250

## 2020-11-06 NOTE — Patient Instructions (Signed)
[facility] Discharge Instructions for Patients Receiving Chemotherapy  Today you received the following chemotherapy agents Trastuzumab  To help prevent nausea and vomiting after your treatment, we encourage you to take your nausea medication as directed   If you develop nausea and vomiting that is not controlled by your nausea medication, call the clinic.   BELOW ARE SYMPTOMS THAT SHOULD BE REPORTED IMMEDIATELY:  *FEVER GREATER THAN 100.5 F  *CHILLS WITH OR WITHOUT FEVER  NAUSEA AND VOMITING THAT IS NOT CONTROLLED WITH YOUR NAUSEA MEDICATION  *UNUSUAL SHORTNESS OF BREATH  *UNUSUAL BRUISING OR BLEEDING  TENDERNESS IN MOUTH AND THROAT WITH OR WITHOUT PRESENCE OF ULCERS  *URINARY PROBLEMS  *BOWEL PROBLEMS  UNUSUAL RASH Items with * indicate a potential emergency and should be followed up as soon as possible.  Feel free to call the clinic should you have any questions or concerns at The clinic phone number is [phone].  Please show the [place] at check-in to the Emergency Department and triage nurse.  Trastuzumab injection for infusion What is this medicine? TRASTUZUMAB (tras TOO zoo mab) is a monoclonal antibody. It is used to treat breast cancer and stomach cancer. This medicine may be used for other purposes; ask your health care provider or pharmacist if you have questions. COMMON BRAND NAME(S): Herceptin, Galvin Proffer, Trazimera What should I tell my health care provider before I take this medicine? They need to know if you have any of these conditions:  heart disease  heart failure  lung or breathing disease, like asthma  an unusual or allergic reaction to trastuzumab, benzyl alcohol, or other medications, foods, dyes, or preservatives  pregnant or trying to get pregnant  breast-feeding How should I use this medicine? This drug is given as an infusion into a vein. It is administered in a hospital  or clinic by a specially trained health care professional. Talk to your pediatrician regarding the use of this medicine in children. This medicine is not approved for use in children. Overdosage: If you think you have taken too much of this medicine contact a poison control center or emergency room at once. NOTE: This medicine is only for you. Do not share this medicine with others. What if I miss a dose? It is important not to miss a dose. Call your doctor or health care professional if you are unable to keep an appointment. What may interact with this medicine? This medicine may interact with the following medications:  certain types of chemotherapy, such as daunorubicin, doxorubicin, epirubicin, and idarubicin This list may not describe all possible interactions. Give your health care provider a list of all the medicines, herbs, non-prescription drugs, or dietary supplements you use. Also tell them if you smoke, drink alcohol, or use illegal drugs. Some items may interact with your medicine. What should I watch for while using this medicine? Visit your doctor for checks on your progress. Report any side effects. Continue your course of treatment even though you feel ill unless your doctor tells you to stop. Call your doctor or health care professional for advice if you get a fever, chills or sore throat, or other symptoms of a cold or flu. Do not treat yourself. Try to avoid being around people who are sick. You may experience fever, chills and shaking during your first infusion. These effects are usually mild and can be treated with other medicines. Report any side effects during the infusion to your health care professional.  Fever and chills usually do not happen with later infusions. Do not become pregnant while taking this medicine or for 7 months after stopping it. Women should inform their doctor if they wish to become pregnant or think they might be pregnant. Women of child-bearing potential  will need to have a negative pregnancy test before starting this medicine. There is a potential for serious side effects to an unborn child. Talk to your health care professional or pharmacist for more information. Do not breast-feed an infant while taking this medicine or for 7 months after stopping it. Women must use effective birth control with this medicine. What side effects may I notice from receiving this medicine? Side effects that you should report to your doctor or health care professional as soon as possible:  allergic reactions like skin rash, itching or hives, swelling of the face, lips, or tongue  chest pain or palpitations  cough  dizziness  feeling faint or lightheaded, falls  fever  general ill feeling or flu-like symptoms  signs of worsening heart failure like breathing problems; swelling in your legs and feet  unusually weak or tired Side effects that usually do not require medical attention (report to your doctor or health care professional if they continue or are bothersome):  bone pain  changes in taste  diarrhea  joint pain  nausea/vomiting  weight loss This list may not describe all possible side effects. Call your doctor for medical advice about side effects. You may report side effects to FDA at 1-800-FDA-1088. Where should I keep my medicine? This drug is given in a hospital or clinic and will not be stored at home. NOTE: This sheet is a summary. It may not cover all possible information. If you have questions about this medicine, talk to your doctor, pharmacist, or health care provider.  2021 Elsevier/Gold Standard (2016-08-02 14:37:52)

## 2020-11-09 ENCOUNTER — Telehealth: Payer: Self-pay

## 2020-11-09 NOTE — Telephone Encounter (Signed)
Patient notified

## 2020-11-09 NOTE — Telephone Encounter (Signed)
-----   Message from Derwood Kaplan, MD sent at 11/04/2020  5:38 PM EDT ----- Regarding: call pt Tell her labs look good except for low salt level

## 2020-11-11 ENCOUNTER — Encounter: Payer: Self-pay | Admitting: Oncology

## 2020-11-11 NOTE — Progress Notes (Unsigned)
Pt has traditional MCR primary/BCBS MCR supplement secondary; no PA required for CPT 93306 ECHO

## 2020-11-17 ENCOUNTER — Encounter: Payer: Self-pay | Admitting: Oncology

## 2020-11-17 DIAGNOSIS — T451X1D Poisoning by antineoplastic and immunosuppressive drugs, accidental (unintentional), subsequent encounter: Secondary | ICD-10-CM | POA: Diagnosis not present

## 2020-11-17 DIAGNOSIS — C50912 Malignant neoplasm of unspecified site of left female breast: Secondary | ICD-10-CM | POA: Diagnosis not present

## 2020-11-23 ENCOUNTER — Telehealth: Payer: Self-pay

## 2020-11-23 NOTE — Telephone Encounter (Addendum)
Pt notified that ECHO looks good. She verbalized understanding.  ----- Message from Belva Chimes, LPN sent at 11/22/3534  3:23 PM EDT ----- Regarding: FW: call pt  ----- Message ----- From: Derwood Kaplan, MD Sent: 11/19/2020   7:20 PM EDT To: Belva Chimes, LPN Subject: call pt                                        Tell her heart test looks good

## 2020-11-27 ENCOUNTER — Other Ambulatory Visit: Payer: Self-pay

## 2020-11-27 ENCOUNTER — Inpatient Hospital Stay: Payer: Medicare Other | Attending: Oncology

## 2020-11-27 VITALS — BP 160/64 | HR 60 | Temp 97.5°F | Resp 18 | Ht 61.5 in | Wt 139.8 lb

## 2020-11-27 DIAGNOSIS — M810 Age-related osteoporosis without current pathological fracture: Secondary | ICD-10-CM | POA: Diagnosis not present

## 2020-11-27 DIAGNOSIS — Z5112 Encounter for antineoplastic immunotherapy: Secondary | ICD-10-CM | POA: Insufficient documentation

## 2020-11-27 DIAGNOSIS — Z170 Estrogen receptor positive status [ER+]: Secondary | ICD-10-CM | POA: Diagnosis not present

## 2020-11-27 DIAGNOSIS — R269 Unspecified abnormalities of gait and mobility: Secondary | ICD-10-CM | POA: Diagnosis not present

## 2020-11-27 DIAGNOSIS — Z79899 Other long term (current) drug therapy: Secondary | ICD-10-CM | POA: Insufficient documentation

## 2020-11-27 DIAGNOSIS — C7951 Secondary malignant neoplasm of bone: Secondary | ICD-10-CM | POA: Insufficient documentation

## 2020-11-27 DIAGNOSIS — C7800 Secondary malignant neoplasm of unspecified lung: Secondary | ICD-10-CM | POA: Insufficient documentation

## 2020-11-27 DIAGNOSIS — C787 Secondary malignant neoplasm of liver and intrahepatic bile duct: Secondary | ICD-10-CM | POA: Insufficient documentation

## 2020-11-27 DIAGNOSIS — C50912 Malignant neoplasm of unspecified site of left female breast: Secondary | ICD-10-CM | POA: Diagnosis not present

## 2020-11-27 MED ORDER — SODIUM CHLORIDE 0.9 % IV SOLN
Freq: Once | INTRAVENOUS | Status: AC
  Filled 2020-11-27: qty 250

## 2020-11-27 MED ORDER — HEPARIN SOD (PORK) LOCK FLUSH 100 UNIT/ML IV SOLN
500.0000 [IU] | Freq: Once | INTRAVENOUS | Status: DC | PRN
  Filled 2020-11-27: qty 5

## 2020-11-27 MED ORDER — TRASTUZUMAB-ANNS CHEMO 150 MG IV SOLR
6.0000 mg/kg | Freq: Once | INTRAVENOUS | Status: AC
  Administered 2020-11-27: 399 mg via INTRAVENOUS
  Filled 2020-11-27: qty 19

## 2020-11-27 NOTE — Progress Notes (Signed)
1226: PT STABLE AT TIME OF DISCHARGE

## 2020-11-27 NOTE — Patient Instructions (Signed)
[facility] Discharge Instructions for Patients Receiving Chemotherapy  Today you received the following chemotherapy agents Trastuzumab  To help prevent nausea and vomiting after your treatment, we encourage you to take your nausea medication as directed.   If you develop nausea and vomiting that is not controlled by your nausea medication, call the clinic.   BELOW ARE SYMPTOMS THAT SHOULD BE REPORTED IMMEDIATELY:  *FEVER GREATER THAN 100.5 F  *CHILLS WITH OR WITHOUT FEVER  NAUSEA AND VOMITING THAT IS NOT CONTROLLED WITH YOUR NAUSEA MEDICATION  *UNUSUAL SHORTNESS OF BREATH  *UNUSUAL BRUISING OR BLEEDING  TENDERNESS IN MOUTH AND THROAT WITH OR WITHOUT PRESENCE OF ULCERS  *URINARY PROBLEMS  *BOWEL PROBLEMS  UNUSUAL RASH Items with * indicate a potential emergency and should be followed up as soon as possible.  Feel free to call the clinic should you have any questions or concerns at The clinic phone number is [phone].  Please show the [facility] at check-in to the Emergency Department and triage nurse.

## 2020-12-11 DIAGNOSIS — Z1231 Encounter for screening mammogram for malignant neoplasm of breast: Secondary | ICD-10-CM | POA: Diagnosis not present

## 2020-12-11 DIAGNOSIS — M8589 Other specified disorders of bone density and structure, multiple sites: Secondary | ICD-10-CM | POA: Diagnosis not present

## 2020-12-14 DIAGNOSIS — H018 Other specified inflammations of eyelid: Secondary | ICD-10-CM | POA: Diagnosis not present

## 2020-12-14 DIAGNOSIS — H04123 Dry eye syndrome of bilateral lacrimal glands: Secondary | ICD-10-CM | POA: Diagnosis not present

## 2020-12-16 ENCOUNTER — Other Ambulatory Visit: Payer: Self-pay | Admitting: Oncology

## 2020-12-18 ENCOUNTER — Other Ambulatory Visit: Payer: Self-pay

## 2020-12-18 ENCOUNTER — Inpatient Hospital Stay: Payer: Medicare Other

## 2020-12-18 VITALS — BP 170/74 | HR 55 | Temp 97.6°F | Resp 18 | Ht 61.5 in | Wt 140.5 lb

## 2020-12-18 DIAGNOSIS — C50912 Malignant neoplasm of unspecified site of left female breast: Secondary | ICD-10-CM

## 2020-12-18 DIAGNOSIS — C787 Secondary malignant neoplasm of liver and intrahepatic bile duct: Secondary | ICD-10-CM | POA: Diagnosis not present

## 2020-12-18 DIAGNOSIS — C7800 Secondary malignant neoplasm of unspecified lung: Secondary | ICD-10-CM | POA: Diagnosis not present

## 2020-12-18 DIAGNOSIS — Z170 Estrogen receptor positive status [ER+]: Secondary | ICD-10-CM | POA: Diagnosis not present

## 2020-12-18 DIAGNOSIS — C7951 Secondary malignant neoplasm of bone: Secondary | ICD-10-CM | POA: Diagnosis not present

## 2020-12-18 DIAGNOSIS — Z5112 Encounter for antineoplastic immunotherapy: Secondary | ICD-10-CM | POA: Diagnosis not present

## 2020-12-18 MED ORDER — TRASTUZUMAB-ANNS CHEMO 150 MG IV SOLR
6.0000 mg/kg | Freq: Once | INTRAVENOUS | Status: AC
  Administered 2020-12-18: 399 mg via INTRAVENOUS
  Filled 2020-12-18: qty 19

## 2020-12-18 MED ORDER — SODIUM CHLORIDE 0.9 % IV SOLN
Freq: Once | INTRAVENOUS | Status: AC
Start: 2020-12-18 — End: 2020-12-18
  Filled 2020-12-18: qty 250

## 2020-12-18 NOTE — Patient Instructions (Signed)
Karen Allison  Discharge Instructions: Thank you for choosing [facility] to provide your oncology and hematology care.  If you have a lab appointment with the [facility], please go directly to the [facility] and check in at the registration area.   Wear comfortable clothing and clothing appropriate for easy access to any Portacath or PICC line.   We strive to give you quality time with your provider. You may need to reschedule your appointment if you arrive late (15 or more minutes).  Arriving late affects you and other patients whose appointments are after yours.  Also, if you miss three or more appointments without notifying the office, you may be dismissed from the clinic at the provider's discretion.      For prescription refill requests, have your pharmacy contact our office and allow 72 hours for refills to be completed.    Today you received the following chemotherapy and/or immunotherapy agents Trastuzumab      To help prevent nausea and vomiting after your treatment, we encourage you to take your nausea medication as directed.  BELOW ARE SYMPTOMS THAT SHOULD BE REPORTED IMMEDIATELY: . *FEVER GREATER THAN 100.4 F (38 C) OR HIGHER . *CHILLS OR SWEATING . *NAUSEA AND VOMITING THAT IS NOT CONTROLLED WITH YOUR NAUSEA MEDICATION . *UNUSUAL SHORTNESS OF BREATH . *UNUSUAL BRUISING OR BLEEDING . *URINARY PROBLEMS (pain or burning when urinating, or frequent urination) . *BOWEL PROBLEMS (unusual diarrhea, constipation, pain near the anus) . TENDERNESS IN MOUTH AND THROAT WITH OR WITHOUT PRESENCE OF ULCERS (sore throat, sores in mouth, or a toothache) . UNUSUAL RASH, SWELLING OR PAIN  . UNUSUAL VAGINAL DISCHARGE OR ITCHING   Items with * indicate a potential emergency and should be followed up as soon as possible or go to the Emergency Department if any problems should occur.  Please show the CHEMOTHERAPY ALERT CARD or IMMUNOTHERAPY ALERT CARD  at check-in to the Emergency Department and triage nurse.  Should you have questions after your visit or need to cancel or reschedule your appointment, please contact [facility]  Dept: [phone]  and follow the prompts.  Office hours are 8:00 a.m. to 4:30 p.m. Monday - Friday. Please note that voicemails left after 4:00 p.m. may not be returned until the following business day.  We are closed weekends and major holidays. You have access to a nurse at all times for urgent questions. Please call the main number to the clinic Dept: [phone] and follow the prompts.  For any non-urgent questions, you may also contact your provider using MyChart. We now offer e-Visits for anyone 58 and older to request care online for non-urgent symptoms. For details visit [email or web address].   Also download the MyChart app! Go to the app store, search "MyChart", open the app, select [facility], and log in with your MyChart username and password.  Due to Covid, a mask is required upon entering the hospital/clinic. If you do not have a mask, one will be given to you upon arrival. For doctor visits, patients may have 1 support person aged 110 or older with them. For treatment visits, patients cannot have anyone with them due to current Covid guidelines and our immunocompromised population.

## 2020-12-18 NOTE — Progress Notes (Signed)
1255: PT STABLE AT TIME OF DISCHARGE

## 2020-12-24 ENCOUNTER — Encounter: Payer: Self-pay | Admitting: Oncology

## 2020-12-25 ENCOUNTER — Telehealth: Payer: Self-pay

## 2020-12-25 NOTE — Telephone Encounter (Addendum)
Pt notified of below & verbalized understanding.  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 The wrist shows slight improvement (t-score -2.4), so now only considered osteopenia. Thanks ----- Message ----- From: Dairl Ponder, RN Sent: 12/25/2020   3:13 PM EDT To: Marvia Pickles, PA-C Subject: Bone density results                           Pt req bone density results @ our convenience.

## 2020-12-29 ENCOUNTER — Encounter: Payer: Self-pay | Admitting: Oncology

## 2020-12-31 ENCOUNTER — Ambulatory Visit: Payer: Medicare Other | Admitting: Specialist

## 2020-12-31 DIAGNOSIS — F419 Anxiety disorder, unspecified: Secondary | ICD-10-CM | POA: Diagnosis not present

## 2020-12-31 DIAGNOSIS — Z6826 Body mass index (BMI) 26.0-26.9, adult: Secondary | ICD-10-CM | POA: Diagnosis not present

## 2020-12-31 DIAGNOSIS — K219 Gastro-esophageal reflux disease without esophagitis: Secondary | ICD-10-CM | POA: Diagnosis not present

## 2020-12-31 DIAGNOSIS — G894 Chronic pain syndrome: Secondary | ICD-10-CM | POA: Diagnosis not present

## 2020-12-31 DIAGNOSIS — E039 Hypothyroidism, unspecified: Secondary | ICD-10-CM | POA: Diagnosis not present

## 2021-01-08 ENCOUNTER — Other Ambulatory Visit: Payer: Self-pay

## 2021-01-08 ENCOUNTER — Inpatient Hospital Stay: Payer: Medicare Other | Attending: Oncology

## 2021-01-08 VITALS — BP 153/61 | HR 59 | Temp 98.1°F | Resp 18 | Wt 140.1 lb

## 2021-01-08 DIAGNOSIS — C50912 Malignant neoplasm of unspecified site of left female breast: Secondary | ICD-10-CM | POA: Diagnosis not present

## 2021-01-08 DIAGNOSIS — Z5112 Encounter for antineoplastic immunotherapy: Secondary | ICD-10-CM | POA: Insufficient documentation

## 2021-01-08 DIAGNOSIS — C787 Secondary malignant neoplasm of liver and intrahepatic bile duct: Secondary | ICD-10-CM | POA: Insufficient documentation

## 2021-01-08 DIAGNOSIS — C7800 Secondary malignant neoplasm of unspecified lung: Secondary | ICD-10-CM | POA: Insufficient documentation

## 2021-01-08 DIAGNOSIS — Z170 Estrogen receptor positive status [ER+]: Secondary | ICD-10-CM | POA: Diagnosis not present

## 2021-01-08 MED ORDER — HEPARIN SOD (PORK) LOCK FLUSH 100 UNIT/ML IV SOLN
500.0000 [IU] | Freq: Once | INTRAVENOUS | Status: DC | PRN
  Filled 2021-01-08: qty 5

## 2021-01-08 MED ORDER — TRASTUZUMAB-ANNS CHEMO 150 MG IV SOLR
6.0000 mg/kg | Freq: Once | INTRAVENOUS | Status: AC
  Administered 2021-01-08: 399 mg via INTRAVENOUS
  Filled 2021-01-08: qty 19

## 2021-01-08 MED ORDER — SODIUM CHLORIDE 0.9 % IV SOLN
Freq: Once | INTRAVENOUS | Status: AC
  Filled 2021-01-08: qty 250

## 2021-01-08 NOTE — Progress Notes (Signed)
Pt d/c stable at 1205

## 2021-01-08 NOTE — Patient Instructions (Signed)
Trastuzumab injection for infusion What is this medicine? TRASTUZUMAB (tras TOO zoo mab) is a monoclonal antibody. It is used to treat breast cancer and stomach cancer. This medicine may be used for other purposes; ask your health care provider or pharmacist if you have questions. COMMON BRAND NAME(S): Herceptin, Galvin Proffer, Trazimera What should I tell my health care provider before I take this medicine? They need to know if you have any of these conditions:  heart disease  heart failure  lung or breathing disease, like asthma  an unusual or allergic reaction to trastuzumab, benzyl alcohol, or other medications, foods, dyes, or preservatives  pregnant or trying to get pregnant  breast-feeding How should I use this medicine? This drug is given as an infusion into a vein. It is administered in a hospital or clinic by a specially trained health care professional. Talk to your pediatrician regarding the use of this medicine in children. This medicine is not approved for use in children. Overdosage: If you think you have taken too much of this medicine contact a poison control center or emergency room at once. NOTE: This medicine is only for you. Do not share this medicine with others. What if I miss a dose? It is important not to miss a dose. Call your doctor or health care professional if you are unable to keep an appointment. What may interact with this medicine? This medicine may interact with the following medications:  certain types of chemotherapy, such as daunorubicin, doxorubicin, epirubicin, and idarubicin This list may not describe all possible interactions. Give your health care provider a list of all the medicines, herbs, non-prescription drugs, or dietary supplements you use. Also tell them if you smoke, drink alcohol, or use illegal drugs. Some items may interact with your medicine. What should I watch for while using this medicine? Visit your  doctor for checks on your progress. Report any side effects. Continue your course of treatment even though you feel ill unless your doctor tells you to stop. Call your doctor or health care professional for advice if you get a fever, chills or sore throat, or other symptoms of a cold or flu. Do not treat yourself. Try to avoid being around people who are sick. You may experience fever, chills and shaking during your first infusion. These effects are usually mild and can be treated with other medicines. Report any side effects during the infusion to your health care professional. Fever and chills usually do not happen with later infusions. Do not become pregnant while taking this medicine or for 7 months after stopping it. Women should inform their doctor if they wish to become pregnant or think they might be pregnant. Women of child-bearing potential will need to have a negative pregnancy test before starting this medicine. There is a potential for serious side effects to an unborn child. Talk to your health care professional or pharmacist for more information. Do not breast-feed an infant while taking this medicine or for 7 months after stopping it. Women must use effective birth control with this medicine. What side effects may I notice from receiving this medicine? Side effects that you should report to your doctor or health care professional as soon as possible:  allergic reactions like skin rash, itching or hives, swelling of the face, lips, or tongue  chest pain or palpitations  cough  dizziness  feeling faint or lightheaded, falls  fever  general ill feeling or flu-like symptoms  signs of worsening heart failure like  breathing problems; swelling in your legs and feet  unusually weak or tired Side effects that usually do not require medical attention (report to your doctor or health care professional if they continue or are bothersome):  bone pain  changes in  taste  diarrhea  joint pain  nausea/vomiting  weight loss This list may not describe all possible side effects. Call your doctor for medical advice about side effects. You may report side effects to FDA at 1-800-FDA-1088. Where should I keep my medicine? This drug is given in a hospital or clinic and will not be stored at home. NOTE: This sheet is a summary. It may not cover all possible information. If you have questions about this medicine, talk to your doctor, pharmacist, or health care provider.  2021 Elsevier/Gold Standard (2016-08-02 14:37:52)

## 2021-01-29 ENCOUNTER — Inpatient Hospital Stay: Payer: Medicare Other | Attending: Oncology

## 2021-01-29 ENCOUNTER — Other Ambulatory Visit: Payer: Self-pay

## 2021-01-29 VITALS — BP 167/56 | HR 53 | Temp 97.8°F | Resp 18 | Ht 61.5 in | Wt 144.0 lb

## 2021-01-29 DIAGNOSIS — Z170 Estrogen receptor positive status [ER+]: Secondary | ICD-10-CM | POA: Insufficient documentation

## 2021-01-29 DIAGNOSIS — Z79899 Other long term (current) drug therapy: Secondary | ICD-10-CM | POA: Diagnosis not present

## 2021-01-29 DIAGNOSIS — M810 Age-related osteoporosis without current pathological fracture: Secondary | ICD-10-CM | POA: Diagnosis not present

## 2021-01-29 DIAGNOSIS — C50912 Malignant neoplasm of unspecified site of left female breast: Secondary | ICD-10-CM | POA: Insufficient documentation

## 2021-01-29 DIAGNOSIS — C787 Secondary malignant neoplasm of liver and intrahepatic bile duct: Secondary | ICD-10-CM | POA: Insufficient documentation

## 2021-01-29 DIAGNOSIS — Z5112 Encounter for antineoplastic immunotherapy: Secondary | ICD-10-CM | POA: Diagnosis not present

## 2021-01-29 MED ORDER — TRASTUZUMAB-ANNS CHEMO 150 MG IV SOLR
6.0000 mg/kg | Freq: Once | INTRAVENOUS | Status: AC
  Administered 2021-01-29: 399 mg via INTRAVENOUS
  Filled 2021-01-29: qty 19

## 2021-01-29 MED ORDER — SODIUM CHLORIDE 0.9 % IV SOLN
Freq: Once | INTRAVENOUS | Status: AC
  Filled 2021-01-29: qty 250

## 2021-01-29 NOTE — Progress Notes (Signed)
1210: PT STABLE AT TIME OF DISCHARGE

## 2021-01-29 NOTE — Patient Instructions (Signed)
Karen Allison  Discharge Instructions: Thank you for choosing [facility] to provide your oncology and hematology care.  If you have a lab appointment with the [facility], please go directly to the [facility] and check in at the registration area.   Wear comfortable clothing and clothing appropriate for easy access to any Portacath or PICC line.   We strive to give you quality time with your provider. You may need to reschedule your appointment if you arrive late (15 or more minutes).  Arriving late affects you and other patients whose appointments are after yours.  Also, if you miss three or more appointments without notifying the office, you may be dismissed from the clinic at the provider's discretion.      For prescription refill requests, have your pharmacy contact our office and allow 72 hours for refills to be completed.    Today you received the following chemotherapy and/or immunotherapy agents Trastuzumab     To help prevent nausea and vomiting after your treatment, we encourage you to take your nausea medication as directed.  BELOW ARE SYMPTOMS THAT SHOULD BE REPORTED IMMEDIATELY: *FEVER GREATER THAN 100.4 F (38 C) OR HIGHER *CHILLS OR SWEATING *NAUSEA AND VOMITING THAT IS NOT CONTROLLED WITH YOUR NAUSEA MEDICATION *UNUSUAL SHORTNESS OF BREATH *UNUSUAL BRUISING OR BLEEDING *URINARY PROBLEMS (pain or burning when urinating, or frequent urination) *BOWEL PROBLEMS (unusual diarrhea, constipation, pain near the anus) TENDERNESS IN MOUTH AND THROAT WITH OR WITHOUT PRESENCE OF ULCERS (sore throat, sores in mouth, or a toothache) UNUSUAL RASH, SWELLING OR PAIN  UNUSUAL VAGINAL DISCHARGE OR ITCHING   Items with * indicate a potential emergency and should be followed up as soon as possible or go to the Emergency Department if any problems should occur.  Please show the CHEMOTHERAPY ALERT CARD or IMMUNOTHERAPY ALERT CARD at check-in to the  Emergency Department and triage nurse.  Should you have questions after your visit or need to cancel or reschedule your appointment, please contact [facility]  Dept: [phone]  and follow the prompts.  Office hours are 8:00 a.m. to 4:30 p.m. Monday - Friday. Please note that voicemails left after 4:00 p.m. may not be returned until the following business day.  We are closed weekends and major holidays. You have access to a nurse at all times for urgent questions. Please call the main number to the clinic Dept: [phone] and follow the prompts.  For any non-urgent questions, you may also contact your provider using MyChart. We now offer e-Visits for anyone 69 and older to request care online for non-urgent symptoms. For details visit [email or web address].   Also download the MyChart app! Go to the app store, search "MyChart", open the app, select [facility], and log in with your MyChart username and password.  Due to Covid, a mask is required upon entering the hospital/clinic. If you do not have a mask, one will be given to you upon arrival. For doctor visits, patients may have 1 support person aged 62 or older with them. For treatment visits, patients cannot have anyone with them due to current Covid guidelines and our immunocompromised population.

## 2021-02-04 ENCOUNTER — Ambulatory Visit: Payer: Medicare Other | Admitting: Specialist

## 2021-02-17 ENCOUNTER — Other Ambulatory Visit: Payer: Self-pay | Admitting: Pharmacist

## 2021-02-18 ENCOUNTER — Telehealth: Payer: Self-pay | Admitting: Oncology

## 2021-02-18 NOTE — Telephone Encounter (Signed)
Per 6/29 Staff Message, patient scheduled for her July, Aug, Sept Infusion Appt's

## 2021-02-19 ENCOUNTER — Ambulatory Visit: Payer: Medicare Other

## 2021-02-19 ENCOUNTER — Other Ambulatory Visit: Payer: Self-pay

## 2021-02-19 ENCOUNTER — Inpatient Hospital Stay: Payer: Medicare Other | Attending: Oncology

## 2021-02-19 VITALS — BP 156/59 | HR 57 | Temp 98.7°F | Resp 18 | Ht 61.5 in | Wt 143.8 lb

## 2021-02-19 DIAGNOSIS — C50919 Malignant neoplasm of unspecified site of unspecified female breast: Secondary | ICD-10-CM | POA: Diagnosis not present

## 2021-02-19 DIAGNOSIS — C50912 Malignant neoplasm of unspecified site of left female breast: Secondary | ICD-10-CM

## 2021-02-19 DIAGNOSIS — Z5112 Encounter for antineoplastic immunotherapy: Secondary | ICD-10-CM | POA: Diagnosis not present

## 2021-02-19 DIAGNOSIS — C7800 Secondary malignant neoplasm of unspecified lung: Secondary | ICD-10-CM | POA: Insufficient documentation

## 2021-02-19 DIAGNOSIS — Z170 Estrogen receptor positive status [ER+]: Secondary | ICD-10-CM | POA: Diagnosis not present

## 2021-02-19 DIAGNOSIS — C787 Secondary malignant neoplasm of liver and intrahepatic bile duct: Secondary | ICD-10-CM | POA: Insufficient documentation

## 2021-02-19 MED ORDER — TRASTUZUMAB-ANNS CHEMO 150 MG IV SOLR
6.0000 mg/kg | Freq: Once | INTRAVENOUS | Status: AC
  Administered 2021-02-19: 399 mg via INTRAVENOUS
  Filled 2021-02-19: qty 19

## 2021-02-19 MED ORDER — SODIUM CHLORIDE 0.9 % IV SOLN
Freq: Once | INTRAVENOUS | Status: AC
Start: 2021-02-19 — End: 2021-02-19
  Filled 2021-02-19: qty 250

## 2021-02-19 NOTE — Progress Notes (Signed)
1305: PT STABLE AT TIME OF DISCHARGE

## 2021-02-19 NOTE — Patient Instructions (Signed)
[facility]  Discharge Instructions: Thank you for choosing [facility] to provide your oncology and hematology care.  If you have a lab appointment with the [facility], please go directly to the [facility] and check in at the registration area.   Wear comfortable clothing and clothing appropriate for easy access to any Portacath or PICC line.   We strive to give you quality time with your provider. You may need to reschedule your appointment if you arrive late (15 or more minutes).  Arriving late affects you and other patients whose appointments are after yours.  Also, if you miss three or more appointments without notifying the office, you may be dismissed from the clinic at the provider's discretion.      For prescription refill requests, have your pharmacy contact our office and allow 72 hours for refills to be completed.    Today you received the following chemotherapy and/or immunotherapy agents Trastuzumab    To help prevent nausea and vomiting after your treatment, we encourage you to take your nausea medication as directed.  BELOW ARE SYMPTOMS THAT SHOULD BE REPORTED IMMEDIATELY: *FEVER GREATER THAN 100.4 F (38 C) OR HIGHER *CHILLS OR SWEATING *NAUSEA AND VOMITING THAT IS NOT CONTROLLED WITH YOUR NAUSEA MEDICATION *UNUSUAL SHORTNESS OF BREATH *UNUSUAL BRUISING OR BLEEDING *URINARY PROBLEMS (pain or burning when urinating, or frequent urination) *BOWEL PROBLEMS (unusual diarrhea, constipation, pain near the anus) TENDERNESS IN MOUTH AND THROAT WITH OR WITHOUT PRESENCE OF ULCERS (sore throat, sores in mouth, or a toothache) UNUSUAL RASH, SWELLING OR PAIN  UNUSUAL VAGINAL DISCHARGE OR ITCHING   Items with * indicate a potential emergency and should be followed up as soon as possible or go to the Emergency Department if any problems should occur.  Please show the CHEMOTHERAPY ALERT CARD or IMMUNOTHERAPY ALERT CARD at check-in to the  Emergency Department and triage nurse.  Should you have questions after your visit or need to cancel or reschedule your appointment, please contact [facility]  Dept: [phone]  and follow the prompts.  Office hours are 8:00 a.m. to 4:30 p.m. Monday - Friday. Please note that voicemails left after 4:00 p.m. may not be returned until the following business day.  We are closed weekends and major holidays. You have access to a nurse at all times for urgent questions. Please call the main number to the clinic Dept: [phone] and follow the prompts.  For any non-urgent questions, you may also contact your provider using MyChart. We now offer e-Visits for anyone 74 and older to request care online for non-urgent symptoms. For details visit [email or web address].   Also download the MyChart app! Go to the app store, search "MyChart", open the app, select , and log in with your MyChart username and password.  Due to Covid, a mask is required upon entering the hospital/clinic. If you do not have a mask, one will be given to you upon arrival. For doctor visits, patients may have 1 support person aged 10 or older with them. For treatment visits, patients cannot have anyone with them due to current Covid guidelines and our immunocompromised population.

## 2021-03-07 ENCOUNTER — Other Ambulatory Visit: Payer: Self-pay | Admitting: Oncology

## 2021-03-10 DIAGNOSIS — C50912 Malignant neoplasm of unspecified site of left female breast: Secondary | ICD-10-CM | POA: Diagnosis not present

## 2021-03-10 DIAGNOSIS — C787 Secondary malignant neoplasm of liver and intrahepatic bile duct: Secondary | ICD-10-CM | POA: Diagnosis not present

## 2021-03-10 DIAGNOSIS — C7800 Secondary malignant neoplasm of unspecified lung: Secondary | ICD-10-CM | POA: Diagnosis not present

## 2021-03-12 ENCOUNTER — Other Ambulatory Visit: Payer: Self-pay

## 2021-03-12 ENCOUNTER — Inpatient Hospital Stay: Payer: Medicare Other

## 2021-03-12 VITALS — BP 157/63 | HR 50 | Temp 99.0°F | Resp 18 | Ht 61.5 in | Wt 147.5 lb

## 2021-03-12 DIAGNOSIS — Z5112 Encounter for antineoplastic immunotherapy: Secondary | ICD-10-CM | POA: Diagnosis not present

## 2021-03-12 DIAGNOSIS — C787 Secondary malignant neoplasm of liver and intrahepatic bile duct: Secondary | ICD-10-CM | POA: Diagnosis not present

## 2021-03-12 DIAGNOSIS — C7800 Secondary malignant neoplasm of unspecified lung: Secondary | ICD-10-CM | POA: Diagnosis not present

## 2021-03-12 DIAGNOSIS — Z170 Estrogen receptor positive status [ER+]: Secondary | ICD-10-CM | POA: Diagnosis not present

## 2021-03-12 DIAGNOSIS — C50919 Malignant neoplasm of unspecified site of unspecified female breast: Secondary | ICD-10-CM | POA: Diagnosis not present

## 2021-03-12 DIAGNOSIS — C50912 Malignant neoplasm of unspecified site of left female breast: Secondary | ICD-10-CM

## 2021-03-12 MED ORDER — TRASTUZUMAB-ANNS CHEMO 150 MG IV SOLR
6.0000 mg/kg | Freq: Once | INTRAVENOUS | Status: AC
  Administered 2021-03-12: 399 mg via INTRAVENOUS
  Filled 2021-03-12: qty 19

## 2021-03-12 MED ORDER — SODIUM CHLORIDE 0.9 % IV SOLN
Freq: Once | INTRAVENOUS | Status: AC
  Filled 2021-03-12: qty 250

## 2021-03-12 NOTE — Progress Notes (Signed)
1118: PT STABLE AT TIME OF DISCHARGE

## 2021-03-12 NOTE — Patient Instructions (Signed)
[facility]  Discharge Instructions: Thank you for choosing [facility] to provide your oncology and hematology care.  If you have a lab appointment with the [facility], please go directly to the [facility] and check in at the registration area.   Wear comfortable clothing and clothing appropriate for easy access to any Portacath or PICC line.   We strive to give you quality time with your provider. You may need to reschedule your appointment if you arrive late (15 or more minutes).  Arriving late affects you and other patients whose appointments are after yours.  Also, if you miss three or more appointments without notifying the office, you may be dismissed from the clinic at the provider's discretion.      For prescription refill requests, have your pharmacy contact our office and allow 72 hours for refills to be completed.    Today you received the following chemotherapy and/or immunotherapy agents Trastuzumab   To help prevent nausea and vomiting after your treatment, we encourage you to take your nausea medication as directed.  BELOW ARE SYMPTOMS THAT SHOULD BE REPORTED IMMEDIATELY: *FEVER GREATER THAN 100.4 F (38 C) OR HIGHER *CHILLS OR SWEATING *NAUSEA AND VOMITING THAT IS NOT CONTROLLED WITH YOUR NAUSEA MEDICATION *UNUSUAL SHORTNESS OF BREATH *UNUSUAL BRUISING OR BLEEDING *URINARY PROBLEMS (pain or burning when urinating, or frequent urination) *BOWEL PROBLEMS (unusual diarrhea, constipation, pain near the anus) TENDERNESS IN MOUTH AND THROAT WITH OR WITHOUT PRESENCE OF ULCERS (sore throat, sores in mouth, or a toothache) UNUSUAL RASH, SWELLING OR PAIN  UNUSUAL VAGINAL DISCHARGE OR ITCHING   Items with * indicate a potential emergency and should be followed up as soon as possible or go to the Emergency Department if any problems should occur.  Please show the CHEMOTHERAPY ALERT CARD or IMMUNOTHERAPY ALERT CARD at check-in to the  Emergency Department and triage nurse.  Should you have questions after your visit or need to cancel or reschedule your appointment, please contact [facility]  Dept: [phone]  and follow the prompts.  Office hours are 8:00 a.m. to 4:30 p.m. Monday - Friday. Please note that voicemails left after 4:00 p.m. may not be returned until the following business day.  We are closed weekends and major holidays. You have access to a nurse at all times for urgent questions. Please call the main number to the clinic Dept: [phone] and follow the prompts.  For any non-urgent questions, you may also contact your provider using MyChart. We now offer e-Visits for anyone 81 and older to request care online for non-urgent symptoms. For details visit [email or web address].   Also download the MyChart app! Go to the app store, search "MyChart", open the app, select [facility], and log in with your MyChart username and password.  Due to Covid, a mask is required upon entering the hospital/clinic. If you do not have a mask, one will be given to you upon arrival. For doctor visits, patients may have 1 support person aged 12 or older with them. For treatment visits, patients cannot have anyone with them due to current Covid guidelines and our immunocompromised population.

## 2021-04-02 ENCOUNTER — Other Ambulatory Visit: Payer: Self-pay

## 2021-04-02 ENCOUNTER — Inpatient Hospital Stay: Payer: Medicare Other | Attending: Oncology

## 2021-04-02 VITALS — BP 151/78 | HR 63 | Temp 97.9°F | Resp 18 | Ht 61.5 in | Wt 147.1 lb

## 2021-04-02 DIAGNOSIS — Z170 Estrogen receptor positive status [ER+]: Secondary | ICD-10-CM | POA: Insufficient documentation

## 2021-04-02 DIAGNOSIS — C787 Secondary malignant neoplasm of liver and intrahepatic bile duct: Secondary | ICD-10-CM | POA: Diagnosis not present

## 2021-04-02 DIAGNOSIS — C50912 Malignant neoplasm of unspecified site of left female breast: Secondary | ICD-10-CM | POA: Diagnosis not present

## 2021-04-02 DIAGNOSIS — Z5112 Encounter for antineoplastic immunotherapy: Secondary | ICD-10-CM | POA: Insufficient documentation

## 2021-04-02 MED ORDER — SODIUM CHLORIDE 0.9 % IV SOLN
Freq: Once | INTRAVENOUS | Status: AC
  Filled 2021-04-02: qty 250

## 2021-04-02 MED ORDER — TRASTUZUMAB-ANNS CHEMO 150 MG IV SOLR
6.0000 mg/kg | Freq: Once | INTRAVENOUS | Status: AC
  Administered 2021-04-02: 399 mg via INTRAVENOUS
  Filled 2021-04-02: qty 19

## 2021-04-02 NOTE — Patient Instructions (Signed)
[facility]  Discharge Instructions: Thank you for choosing [facility] to provide your oncology and hematology care.  If you have a lab appointment with the [facility], please go directly to the [facility] and check in at the registration area.   Wear comfortable clothing and clothing appropriate for easy access to any Portacath or PICC line.   We strive to give you quality time with your provider. You may need to reschedule your appointment if you arrive late (15 or more minutes).  Arriving late affects you and other patients whose appointments are after yours.  Also, if you miss three or more appointments without notifying the office, you may be dismissed from the clinic at the provider's discretion.      For prescription refill requests, have your pharmacy contact our office and allow 72 hours for refills to be completed.    Today you received the following chemotherapy and/or immunotherapy agents trastuzumab   To help prevent nausea and vomiting after your treatment, we encourage you to take your nausea medication as directed.  BELOW ARE SYMPTOMS THAT SHOULD BE REPORTED IMMEDIATELY: *FEVER GREATER THAN 100.4 F (38 C) OR HIGHER *CHILLS OR SWEATING *NAUSEA AND VOMITING THAT IS NOT CONTROLLED WITH YOUR NAUSEA MEDICATION *UNUSUAL SHORTNESS OF BREATH *UNUSUAL BRUISING OR BLEEDING *URINARY PROBLEMS (pain or burning when urinating, or frequent urination) *BOWEL PROBLEMS (unusual diarrhea, constipation, pain near the anus) TENDERNESS IN MOUTH AND THROAT WITH OR WITHOUT PRESENCE OF ULCERS (sore throat, sores in mouth, or a toothache) UNUSUAL RASH, SWELLING OR PAIN  UNUSUAL VAGINAL DISCHARGE OR ITCHING   Items with * indicate a potential emergency and should be followed up as soon as possible or go to the Emergency Department if any problems should occur.  Please show the CHEMOTHERAPY ALERT CARD or IMMUNOTHERAPY ALERT CARD at check-in to the  Emergency Department and triage nurse.  Should you have questions after your visit or need to cancel or reschedule your appointment, please contact [facility]  Dept: [phone]  and follow the prompts.  Office hours are 8:00 a.m. to 4:30 p.m. Monday - Friday. Please note that voicemails left after 4:00 p.m. may not be returned until the following business day.  We are closed weekends and major holidays. You have access to a nurse at all times for urgent questions. Please call the main number to the clinic Dept: [phone] and follow the prompts.  For any non-urgent questions, you may also contact your provider using MyChart. We now offer e-Visits for anyone 9 and older to request care online for non-urgent symptoms. For details visit [email or web address].   Also download the MyChart app! Go to the app store, search "MyChart", open the app, select [facility], and log in with your MyChart username and password.  Due to Covid, a mask is required upon entering the hospital/clinic. If you do not have a mask, one will be given to you upon arrival. For doctor visits, patients may have 1 support person aged 93 or older with them. For treatment visits, patients cannot have anyone with them due to current Covid guidelines and our immunocompromised population.   Trastuzumab injection for infusion What is this medication? TRASTUZUMAB (tras TOO zoo mab) is a monoclonal antibody. It is used to treatbreast cancer and stomach cancer. This medicine may be used for other purposes; ask your health care provider orpharmacist if you have questions. COMMON BRAND NAME(S): Herceptin, Donnald Garre What  should I tell my care team before I take this medication? They need to know if you have any of these conditions: heart disease heart failure lung or breathing disease, like asthma an unusual or allergic reaction to trastuzumab, benzyl alcohol, or other  medications, foods, dyes, or preservatives pregnant or trying to get pregnant breast-feeding How should I use this medication? This drug is given as an infusion into a vein. It is administered in a hospitalor clinic by a specially trained health care professional. Talk to your pediatrician regarding the use of this medicine in children. Thismedicine is not approved for use in children. Overdosage: If you think you have taken too much of this medicine contact apoison control center or emergency room at once. NOTE: This medicine is only for you. Do not share this medicine with others. What if I miss a dose? It is important not to miss a dose. Call your doctor or health careprofessional if you are unable to keep an appointment. What may interact with this medication? This medicine may interact with the following medications: certain types of chemotherapy, such as daunorubicin, doxorubicin, epirubicin, and idarubicin This list may not describe all possible interactions. Give your health care provider a list of all the medicines, herbs, non-prescription drugs, or dietary supplements you use. Also tell them if you smoke, drink alcohol, or use illegaldrugs. Some items may interact with your medicine. What should I watch for while using this medication? Visit your doctor for checks on your progress. Report any side effects. Continue your course of treatment even though you feel ill unless your doctortells you to stop. Call your doctor or health care professional for advice if you get a fever, chills or sore throat, or other symptoms of a cold or flu. Do not treatyourself. Try to avoid being around people who are sick. You may experience fever, chills and shaking during your first infusion. These effects are usually mild and can be treated with other medicines. Report any side effects during the infusion to your health care professional. Fever andchills usually do not happen with later infusions. Do not  become pregnant while taking this medicine or for 7 months after stopping it. Women should inform their doctor if they wish to become pregnant or think they might be pregnant. Women of child-bearing potential will need to have a negative pregnancy test before starting this medicine. There is a potential for serious side effects to an unborn child. Talk to your health care professional or pharmacist for more information. Do not breast-feed an infantwhile taking this medicine or for 7 months after stopping it. Women must use effective birth control with this medicine. What side effects may I notice from receiving this medication? Side effects that you should report to your doctor or health care professionalas soon as possible: allergic reactions like skin rash, itching or hives, swelling of the face, lips, or tongue chest pain or palpitations cough dizziness feeling faint or lightheaded, falls fever general ill feeling or flu-like symptoms signs of worsening heart failure like breathing problems; swelling in your legs and feet unusually weak or tired Side effects that usually do not require medical attention (report to yourdoctor or health care professional if they continue or are bothersome): bone pain changes in taste diarrhea joint pain nausea/vomiting weight loss This list may not describe all possible side effects. Call your doctor for medical advice about side effects. You may report side effects to FDA at1-800-FDA-1088. Where should I keep my medication? This drug is given  in a hospital or clinic and will not be stored at home. NOTE: This sheet is a summary. It may not cover all possible information. If you have questions about this medicine, talk to your doctor, pharmacist, orhealth care provider.  2022 Elsevier/Gold Standard (2016-08-02 14:37:52)

## 2021-04-08 DIAGNOSIS — G4733 Obstructive sleep apnea (adult) (pediatric): Secondary | ICD-10-CM | POA: Diagnosis not present

## 2021-04-08 DIAGNOSIS — I10 Essential (primary) hypertension: Secondary | ICD-10-CM | POA: Diagnosis not present

## 2021-04-08 DIAGNOSIS — M5137 Other intervertebral disc degeneration, lumbosacral region: Secondary | ICD-10-CM | POA: Diagnosis not present

## 2021-04-08 DIAGNOSIS — E039 Hypothyroidism, unspecified: Secondary | ICD-10-CM | POA: Diagnosis not present

## 2021-04-22 ENCOUNTER — Encounter: Payer: Self-pay | Admitting: Specialist

## 2021-04-22 ENCOUNTER — Ambulatory Visit (INDEPENDENT_AMBULATORY_CARE_PROVIDER_SITE_OTHER): Payer: Medicare Other | Admitting: Specialist

## 2021-04-22 ENCOUNTER — Encounter: Payer: Self-pay | Admitting: Oncology

## 2021-04-22 VITALS — BP 171/71 | HR 64 | Ht 65.0 in | Wt 145.0 lb

## 2021-04-22 DIAGNOSIS — M7522 Bicipital tendinitis, left shoulder: Secondary | ICD-10-CM | POA: Diagnosis not present

## 2021-04-22 DIAGNOSIS — M25561 Pain in right knee: Secondary | ICD-10-CM

## 2021-04-22 DIAGNOSIS — M4326 Fusion of spine, lumbar region: Secondary | ICD-10-CM

## 2021-04-22 DIAGNOSIS — M4322 Fusion of spine, cervical region: Secondary | ICD-10-CM

## 2021-04-22 DIAGNOSIS — S46911D Strain of unspecified muscle, fascia and tendon at shoulder and upper arm level, right arm, subsequent encounter: Secondary | ICD-10-CM | POA: Diagnosis not present

## 2021-04-22 DIAGNOSIS — G8929 Other chronic pain: Secondary | ICD-10-CM

## 2021-04-22 DIAGNOSIS — R2689 Other abnormalities of gait and mobility: Secondary | ICD-10-CM

## 2021-04-22 DIAGNOSIS — M79631 Pain in right forearm: Secondary | ICD-10-CM

## 2021-04-22 NOTE — Progress Notes (Signed)
Office Visit Note   Patient: Karen Allison           Date of Birth: 1946-06-11           MRN: [id number] Visit Date: 04/22/2021              Requested by: [name], MD [address] [place],  [place] [address] PCP: [name], MD   Assessment & Plan: Visit Diagnoses:  1. Cervical vertebral fusion   2. Fusion of lumbar spine   3. Strain of right shoulder, subsequent encounter   4. Balance problem   5. Bicipital tendonitis of shoulder, left   6. Pain in right forearm   7. Chronic pain of right knee     Plan: Avoid overhead lifting and overhead use of the arms. Do not lift greater than 5 lbs. Adjust head rest in vehicle to prevent hyperextension if rear ended. Take extra precautions to avoid falling.  Avoid bending, stooping and avoid lifting weights greater than 10 lbs. Avoid prolong standing and walking. Avoid frequent bending and stooping  No lifting greater than 10 lbs. May use ice or moist heat for pain. Weight loss is of benefit. Handicap license is approved. Fall Prevention and Home Safety Falls cause injuries and can affect all age groups. It is possible to use preventive measures to significantly decrease the likelihood of falls. There are many simple measures which can make your home safer and prevent falls. OUTDOORS Repair cracks and edges of walkways and driveways. Remove high doorway thresholds. Trim shrubbery on the main path into your home. Have good outside lighting. Clear walkways of tools, rocks, debris, and clutter. Check that handrails are not broken and are securely fastened. Both sides of steps should have handrails. Have leaves, snow, and ice cleared regularly. Use sand or salt on walkways during winter months. In the garage, clean up grease or oil spills. BATHROOM Install night lights. Install grab bars by the toilet and in the tub and shower. Use non-skid mats or decals in the tub or shower. Place a plastic non-slip stool in  the shower to sit on, if needed. Keep floors dry and clean up all water on the floor immediately. Remove soap buildup in the tub or shower on a regular basis. Secure bath mats with non-slip, double-sided rug tape. Remove throw rugs and tripping hazards from the floors. BEDROOMS Install night lights. Make sure a bedside light is easy to reach. Do not use oversized bedding. Keep a telephone by your bedside. Have a firm chair with side arms to use for getting dressed. Remove throw rugs and tripping hazards from the floor. KITCHEN Keep handles on pots and pans turned toward the center of the stove. Use back burners when possible. Clean up spills quickly and allow time for drying. Avoid walking on wet floors. Avoid hot utensils and knives. Position shelves so they are not too high or low. Place commonly used objects within easy reach. If necessary, use a sturdy step stool with a grab bar when reaching. Keep electrical cables out of the way. Do not use floor polish or wax that makes floors slippery. If you must use wax, use non-skid floor wax. Remove throw rugs and tripping hazards from the floor. STAIRWAYS Never leave objects on stairs. Place handrails on both sides of stairways and use them. Fix any loose handrails. Make sure handrails on both sides of the stairways are as long as the stairs. Check carpeting to make sure it is firmly  attached along stairs. Make repairs to worn or loose carpet promptly. Avoid placing throw rugs at the top or bottom of stairways, or properly secure the rug with carpet tape to prevent slippage. Get rid of throw rugs, if possible. Have an electrician put in a light switch at the top and bottom of the stairs. OTHER FALL PREVENTION TIPS Wear low-heel or rubber-soled shoes that are supportive and fit well. Wear closed toe shoes. When using a stepladder, make sure it is fully opened and both spreaders are firmly locked. Do not climb a closed stepladder. Add color  or contrast paint or tape to grab bars and handrails in your home. Place contrasting color strips on first and last steps. Learn and use mobility aids as needed. Install an electrical emergency response system. Turn on lights to avoid dark areas. Replace light bulbs that burn out immediately. Get light switches that glow. Arrange furniture to create clear pathways. Keep furniture in the same place. Firmly attach carpet with non-skid or double-sided tape. Eliminate uneven floor surfaces. Select a carpet pattern that does not visually hide the edge of steps. Be aware of all pets. OTHER HOME SAFETY TIPS Set the water temperature for 120 F (48.8 C). Keep emergency numbers on or near the telephone. Keep smoke detectors on every level of the home and near sleeping areas. Document Released: 07/29/2002 Document Revised: 02/07/2012 Document Reviewed: 10/28/2011 [facility] Patient Information 2014 [place].     Follow-Up Instructions: No follow-ups on file.   Orders:  No orders of the defined types were placed in this encounter.  No orders of the defined types were placed in this encounter.     Procedures: No procedures performed   Clinical Data: No additional findings.   Subjective: Chief Complaint  Patient presents with   Lower Back - Follow-up    75 year old female with cervicalgia and right shoulder pain. History of long thoracolumbar fusion T10 to S1.   Review of Systems  Constitutional: Negative.   HENT: Negative.    Eyes: Negative.   Respiratory: Negative.    Cardiovascular: Negative.   Gastrointestinal: Negative.   Endocrine: Negative.   Genitourinary: Negative.   Musculoskeletal: Negative.   Skin: Negative.   Allergic/Immunologic: Negative.   Neurological: Negative.   Hematological: Negative.   Psychiatric/Behavioral: Negative.      Objective: Vital Signs: BP (!) 171/71   Pulse 64   Ht 5\' 5"  (1.651 m)   Wt 145 lb (65.8 kg)   BMI 24.13 kg/m    Physical Exam Constitutional:      Appearance: She is well-developed.  HENT:     Head: Normocephalic and atraumatic.  Eyes:     Pupils: Pupils are equal, round, and reactive to light.  Pulmonary:     Effort: Pulmonary effort is normal.     Breath sounds: Normal breath sounds.  Abdominal:     General: Bowel sounds are normal.     Palpations: Abdomen is soft.  Musculoskeletal:     Cervical back: Normal range of motion and neck supple.  Skin:    General: Skin is warm and dry.  Neurological:     Mental Status: She is alert and oriented to person, place, and time.  Psychiatric:        Behavior: Behavior normal.        Thought Content: Thought content normal.        Judgment: Judgment normal.   Back Exam   Tenderness  The patient is experiencing tenderness in  the cervical and lumbar.  Range of Motion  Extension:  abnormal  Flexion:  abnormal  Lateral bend right:  abnormal  Lateral bend left:  abnormal  Rotation right:  abnormal  Rotation left:  abnormal   Muscle Strength  Right Quadriceps:  5/5  Left Quadriceps:  5/5  Right Hamstrings:  5/5  Left Hamstrings:  5/5   Reflexes  Patellar:  2/4 Achilles:  2/4  Other  Toe walk: normal Heel walk: normal Sensation: normal Gait: normal  Erythema: no back redness Scars: absent    Specialty Comments:  No specialty comments available.  Imaging: No results found.   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[facility]) 06/05/2020   Malignant neoplasm of left breast ([facility]) 06/05/2020   Gait disorder 07/05/2016   HCAP (healthcare-associated pneumonia)  05/09/2014   Acute respiratory failure with hypoxia ([facility]) 05/09/2014   Pneumonia 05/09/2014   Hypertension    Hypothyroidism    Cervical stenosis (uterine cervix) 05/07/2014   HNP (herniated nucleus pulposus), cervical 05/06/2014   Adult hypothyroidism 05/03/2013   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   Past Medical History:  Diagnosis Date   Arthritis    Blood dyscrasia    bleeds and bruises easily   Breast cancer ([facility])    left   Gait disorder 07/05/2016   Hepatitis     Hep A years ago   Hypertension    Hypothyroidism    Graves disease, age 29 yo   Nose trouble    right nostril will hemorrhage at times   Pneumonia    Rosacea    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[facility])     Family History  Problem Relation Age of Onset   Congestive Heart Failure Mother    Heart disease Father    Tongue cancer Grandchild     Past Surgical History:  Procedure Laterality Date   ABDOMINAL HYSTERECTOMY     Age 42   ANTERIOR CERVICAL DECOMP/DISCECTOMY FUSION N/A 05/06/2014   Procedure: ANTERIOR CERVICAL DISCECTOMY FUSION C3-4 with transgraft, local bone graft, plate and screws;  Surgeon: Jessy Oto, MD;  Location: [facility];  Service: Orthopedics;  Laterality: N/A;   APPENDECTOMY     BREAST LUMPECTOMY Left    COLONOSCOPY     EYE SURGERY Bilateral    cataracts   LUMBAR SPINE SURGERY  2011   Removal of hematoma   ORIF HUMERUS FRACTURE Left 2013   THORACIC SPINE SURGERY  2011   Bone cages   THYROIDECTOMY     TONSILLECTOMY     Social History   Occupational History   Occupation: Retired  Tobacco Use   Smoking status: Former    Types: Cigarettes    Quit date: 04/23/1962    Years since quitting: 59.0   Smokeless tobacco: Never   Tobacco comments:    as teenager  Substance and Sexual Activity   Alcohol use: Yes    Comment: Social   Drug use: No   Sexual activity: Not on file

## 2021-04-22 NOTE — Patient Instructions (Signed)
Plan: Avoid overhead lifting and overhead use of the arms. Do not lift greater than 5 lbs. Adjust head rest in vehicle to prevent hyperextension if rear ended. Take extra precautions to avoid falling.  Avoid bending, stooping and avoid lifting weights greater than 10 lbs. Avoid prolong standing and walking. Avoid frequent bending and stooping  No lifting greater than 10 lbs. May use ice or moist heat for pain. Weight loss is of benefit. Handicap license is approved. Fall Prevention and Home Safety Falls cause injuries and can affect all age groups. It is possible to use preventive measures to significantly decrease the likelihood of falls. There are many simple measures which can make your home safer and prevent falls. OUTDOORS Repair cracks and edges of walkways and driveways. Remove high doorway thresholds. Trim shrubbery on the main path into your home. Have good outside lighting. Clear walkways of tools, rocks, debris, and clutter. Check that handrails are not broken and are securely fastened. Both sides of steps should have handrails. Have leaves, snow, and ice cleared regularly. Use sand or salt on walkways during winter months. In the garage, clean up grease or oil spills. BATHROOM Install night lights. Install grab bars by the toilet and in the tub and shower. Use non-skid mats or decals in the tub or shower. Place a plastic non-slip stool in the shower to sit on, if needed. Keep floors dry and clean up all water on the floor immediately. Remove soap buildup in the tub or shower on a regular basis. Secure bath mats with non-slip, double-sided rug tape. Remove throw rugs and tripping hazards from the floors. BEDROOMS Install night lights. Make sure a bedside light is easy to reach. Do not use oversized bedding. Keep a telephone by your bedside. Have a firm chair with side arms to use for getting dressed. Remove throw rugs and tripping hazards from the  floor. KITCHEN Keep handles on pots and pans turned toward the center of the stove. Use back burners when possible. Clean up spills quickly and allow time for drying. Avoid walking on wet floors. Avoid hot utensils and knives. Position shelves so they are not too high or low. Place commonly used objects within easy reach. If necessary, use a sturdy step stool with a grab bar when reaching. Keep electrical cables out of the way. Do not use floor polish or wax that makes floors slippery. If you must use wax, use non-skid floor wax. Remove throw rugs and tripping hazards from the floor. STAIRWAYS Never leave objects on stairs. Place handrails on both sides of stairways and use them. Fix any loose handrails. Make sure handrails on both sides of the stairways are as long as the stairs. Check carpeting to make sure it is firmly attached along stairs. Make repairs to worn or loose carpet promptly. Avoid placing throw rugs at the top or bottom of stairways, or properly secure the rug with carpet tape to prevent slippage. Get rid of throw rugs, if possible. Have an electrician put in a light switch at the top and bottom of the stairs. OTHER FALL PREVENTION TIPS Wear low-heel or rubber-soled shoes that are supportive and fit well. Wear closed toe shoes. When using a stepladder, make sure it is fully opened and both spreaders are firmly locked. Do not climb a closed stepladder. Add color or contrast paint or tape to grab bars and handrails in your home. Place contrasting color strips on first and last steps. Learn and use mobility aids as needed. Install  an electrical emergency response system. Turn on lights to avoid dark areas. Replace light bulbs that burn out immediately. Get light switches that glow. Arrange furniture to create clear pathways. Keep furniture in the same place. Firmly attach carpet with non-skid or double-sided tape. Eliminate uneven floor surfaces. Select a carpet pattern that  does not visually hide the edge of steps. Be aware of all pets. OTHER HOME SAFETY TIPS Set the water temperature for 120 F (48.8 C). Keep emergency numbers on or near the telephone. Keep smoke detectors on every level of the home and near sleeping areas. Document Released: 07/29/2002 Document Revised: 02/07/2012 Document Reviewed: 10/28/2011 [facility] Patient Information 2014 [place].

## 2021-04-23 ENCOUNTER — Other Ambulatory Visit: Payer: Self-pay

## 2021-04-23 ENCOUNTER — Inpatient Hospital Stay: Payer: Medicare Other | Attending: Oncology

## 2021-04-23 VITALS — BP 150/71 | HR 68 | Temp 97.8°F | Resp 18 | Ht 66.0 in | Wt 144.0 lb

## 2021-04-23 DIAGNOSIS — Z170 Estrogen receptor positive status [ER+]: Secondary | ICD-10-CM | POA: Diagnosis not present

## 2021-04-23 DIAGNOSIS — C50912 Malignant neoplasm of unspecified site of left female breast: Secondary | ICD-10-CM | POA: Diagnosis not present

## 2021-04-23 DIAGNOSIS — Z5112 Encounter for antineoplastic immunotherapy: Secondary | ICD-10-CM | POA: Insufficient documentation

## 2021-04-23 DIAGNOSIS — C787 Secondary malignant neoplasm of liver and intrahepatic bile duct: Secondary | ICD-10-CM | POA: Diagnosis not present

## 2021-04-23 MED ORDER — SODIUM CHLORIDE 0.9 % IV SOLN: Freq: Once | INTRAVENOUS | Status: AC

## 2021-04-23 MED ORDER — TRASTUZUMAB-ANNS CHEMO 150 MG IV SOLR
6.0000 mg/kg | Freq: Once | INTRAVENOUS | Status: AC
  Administered 2021-04-23: 399 mg via INTRAVENOUS
  Filled 2021-04-23: qty 19

## 2021-04-23 NOTE — Patient Instructions (Signed)
[facility]  Discharge Instructions: Thank you for choosing [facility] to provide your oncology and hematology care.  If you have a lab appointment with the [facility], please go directly to the [facility] and check in at the registration area.   Wear comfortable clothing and clothing appropriate for easy access to any Portacath or PICC line.   We strive to give you quality time with your provider. You may need to reschedule your appointment if you arrive late (15 or more minutes).  Arriving late affects you and other patients whose appointments are after yours.  Also, if you miss three or more appointments without notifying the office, you may be dismissed from the clinic at the provider's discretion.      For prescription refill requests, have your pharmacy contact our office and allow 72 hours for refills to be completed.    Today you received the following chemotherapy and/or immunotherapy agents Beryle Flock    To help prevent nausea and vomiting after your treatment, we encourage you to take your nausea medication as directed.  BELOW ARE SYMPTOMS THAT SHOULD BE REPORTED IMMEDIATELY: *FEVER GREATER THAN 100.4 F (38 C) OR HIGHER *CHILLS OR SWEATING *NAUSEA AND VOMITING THAT IS NOT CONTROLLED WITH YOUR NAUSEA MEDICATION *UNUSUAL SHORTNESS OF BREATH *UNUSUAL BRUISING OR BLEEDING *URINARY PROBLEMS (pain or burning when urinating, or frequent urination) *BOWEL PROBLEMS (unusual diarrhea, constipation, pain near the anus) TENDERNESS IN MOUTH AND THROAT WITH OR WITHOUT PRESENCE OF ULCERS (sore throat, sores in mouth, or a toothache) UNUSUAL RASH, SWELLING OR PAIN  UNUSUAL VAGINAL DISCHARGE OR ITCHING   Items with * indicate a potential emergency and should be followed up as soon as possible or go to the Emergency Department if any problems should occur.  Please show the CHEMOTHERAPY ALERT CARD or IMMUNOTHERAPY ALERT CARD at check-in to the  Emergency Department and triage nurse.  Should you have questions after your visit or need to cancel or reschedule your appointment, please contact [facility]  Dept: [phone]  and follow the prompts.  Office hours are 8:00 a.m. to 4:30 p.m. Monday - Friday. Please note that voicemails left after 4:00 p.m. may not be returned until the following business day.  We are closed weekends and major holidays. You have access to a nurse at all times for urgent questions. Please call the main number to the clinic Dept: [phone] and follow the prompts.  For any non-urgent questions, you may also contact your provider using MyChart. We now offer e-Visits for anyone 31 and older to request care online for non-urgent symptoms. For details visit [email or web address].   Also download the MyChart app! Go to the app store, search "MyChart", open the app, select [facility], and log in with your MyChart username and password.  Due to Covid, a mask is required upon entering the hospital/clinic. If you do not have a mask, one will be given to you upon arrival. For doctor visits, patients may have 1 support person aged 107 or older with them. For treatment visits, patients cannot have anyone with them due to current Covid guidelines and our immunocompromised population.   Pembrolizumab injection What is this medication? PEMBROLIZUMAB (pem broe liz ue mab) is a monoclonal antibody. It is used to treat certain types of cancer. This medicine may be used for other purposes; ask your health care provider or pharmacist if you have questions. COMMON BRAND NAME(S): Keytruda What should I tell my  care team before I take this medication? They need to know if you have any of these conditions: autoimmune diseases like Crohn's disease, ulcerative colitis, or lupus have had or planning to have an allogeneic stem cell transplant (uses someone else's stem cells) history of organ transplant history  of chest radiation nervous system problems like myasthenia gravis or Guillain-Barre syndrome an unusual or allergic reaction to pembrolizumab, other medicines, foods, dyes, or preservatives pregnant or trying to get pregnant breast-feeding How should I use this medication? This medicine is for infusion into a vein. It is given by a health care professional in a hospital or clinic setting. A special MedGuide will be given to you before each treatment. Be sure to read this information carefully each time. Talk to your pediatrician regarding the use of this medicine in children. While this drug may be prescribed for children as young as 6 months for selected conditions, precautions do apply. Overdosage: If you think you have taken too much of this medicine contact a poison control center or emergency room at once. NOTE: This medicine is only for you. Do not share this medicine with others. What if I miss a dose? It is important not to miss your dose. Call your doctor or health care professional if you are unable to keep an appointment. What may interact with this medication? Interactions have not been studied. This list may not describe all possible interactions. Give your health care provider a list of all the medicines, herbs, non-prescription drugs, or dietary supplements you use. Also tell them if you smoke, drink alcohol, or use illegal drugs. Some items may interact with your medicine. What should I watch for while using this medication? Your condition will be monitored carefully while you are receiving this medicine. You may need blood work done while you are taking this medicine. Do not become pregnant while taking this medicine or for 4 months after stopping it. Women should inform their doctor if they wish to become pregnant or think they might be pregnant. There is a potential for serious side effects to an unborn child. Talk to your health care professional or pharmacist for more  information. Do not breast-feed an infant while taking this medicine or for 4 months after the last dose. What side effects may I notice from receiving this medication? Side effects that you should report to your doctor or health care professional as soon as possible: allergic reactions like skin rash, itching or hives, swelling of the face, lips, or tongue bloody or black, tarry breathing problems changes in vision chest pain chills confusion constipation cough diarrhea dizziness or feeling faint or lightheaded fast or irregular heartbeat fever flushing joint pain low blood counts - this medicine may decrease the number of white blood cells, red blood cells and platelets. You may be at increased risk for infections and bleeding. muscle pain muscle weakness pain, tingling, numbness in the hands or feet persistent headache redness, blistering, peeling or loosening of the skin, including inside the mouth signs and symptoms of high blood sugar such as dizziness; dry mouth; dry skin; fruity breath; nausea; stomach pain; increased hunger or thirst; increased urination signs and symptoms of kidney injury like trouble passing urine or change in the amount of urine signs and symptoms of liver injury like dark urine, light-colored stools, loss of appetite, nausea, right upper belly pain, yellowing of the eyes or skin sweating swollen lymph nodes weight loss Side effects that usually do not require medical attention (report to your  doctor or health care professional if they continue or are bothersome): decreased appetite hair loss tiredness This list may not describe all possible side effects. Call your doctor for medical advice about side effects. You may report side effects to FDA at 1-800-FDA-1088. Where should I keep my medication? This drug is given in a hospital or clinic and will not be stored at home. NOTE: This sheet is a summary. It may not cover all possible information. If you  have questions about this medicine, talk to your doctor, pharmacist, or health care provider.  2022 Elsevier/Gold Standard (2019-07-10 21:44:53)

## 2021-05-13 MED FILL — Trastuzumab-anns For IV Soln 150 MG: INTRAVENOUS | Qty: 19 | Status: AC

## 2021-05-14 ENCOUNTER — Other Ambulatory Visit: Payer: Self-pay

## 2021-05-14 ENCOUNTER — Inpatient Hospital Stay: Payer: Medicare Other

## 2021-05-14 VITALS — BP 161/64 | HR 56 | Temp 98.2°F | Resp 18 | Ht 66.0 in | Wt 151.0 lb

## 2021-05-14 DIAGNOSIS — Z5112 Encounter for antineoplastic immunotherapy: Secondary | ICD-10-CM | POA: Diagnosis not present

## 2021-05-14 DIAGNOSIS — C50912 Malignant neoplasm of unspecified site of left female breast: Secondary | ICD-10-CM | POA: Diagnosis not present

## 2021-05-14 DIAGNOSIS — C787 Secondary malignant neoplasm of liver and intrahepatic bile duct: Secondary | ICD-10-CM | POA: Diagnosis not present

## 2021-05-14 DIAGNOSIS — Z170 Estrogen receptor positive status [ER+]: Secondary | ICD-10-CM | POA: Diagnosis not present

## 2021-05-14 MED ORDER — SODIUM CHLORIDE 0.9 % IV SOLN: Freq: Once | INTRAVENOUS | Status: AC

## 2021-05-14 MED ORDER — TRASTUZUMAB-ANNS CHEMO 150 MG IV SOLR
6.0000 mg/kg | Freq: Once | INTRAVENOUS | Status: AC
  Administered 2021-05-14: 399 mg via INTRAVENOUS
  Filled 2021-05-14: qty 19

## 2021-05-14 NOTE — Patient Instructions (Signed)
[facility] [place]  Discharge Instructions: Thank you for choosing [facility] to provide your oncology and hematology care.  If you have a lab appointment with the [facility], please go directly to the [facility] and check in at the registration area.   Wear comfortable clothing and clothing appropriate for easy access to any Portacath or PICC line.   We strive to give you quality time with your provider. You may need to reschedule your appointment if you arrive late (15 or more minutes).  Arriving late affects you and other patients whose appointments are after yours.  Also, if you miss three or more appointments without notifying the office, you may be dismissed from the clinic at the provider's discretion.      For prescription refill requests, have your pharmacy contact our office and allow 72 hours for refills to be completed.    Today you received the following chemotherapy and/or immunotherapy agents TRASTUZUMAB      To help prevent nausea and vomiting after your treatment, we encourage you to take your nausea medication as directed.  BELOW ARE SYMPTOMS THAT SHOULD BE REPORTED IMMEDIATELY: *FEVER GREATER THAN 100.4 F (38 C) OR HIGHER *CHILLS OR SWEATING *NAUSEA AND VOMITING THAT IS NOT CONTROLLED WITH YOUR NAUSEA MEDICATION *UNUSUAL SHORTNESS OF BREATH *UNUSUAL BRUISING OR BLEEDING *URINARY PROBLEMS (pain or burning when urinating, or frequent urination) *BOWEL PROBLEMS (unusual diarrhea, constipation, pain near the anus) TENDERNESS IN MOUTH AND THROAT WITH OR WITHOUT PRESENCE OF ULCERS (sore throat, sores in mouth, or a toothache) UNUSUAL RASH, SWELLING OR PAIN  UNUSUAL VAGINAL DISCHARGE OR ITCHING   Items with * indicate a potential emergency and should be followed up as soon as possible or go to the Emergency Department if any problems should occur.  Please show the CHEMOTHERAPY ALERT CARD or IMMUNOTHERAPY ALERT CARD at check-in to the  Emergency Department and triage nurse.  Should you have questions after your visit or need to cancel or reschedule your appointment, please contact [facility]  Dept: [phone]  and follow the prompts.  Office hours are 8:00 a.m. to 4:30 p.m. Monday - Friday. Please note that voicemails left after 4:00 p.m. may not be returned until the following business day.  We are closed weekends and major holidays. You have access to a nurse at all times for urgent questions. Please call the main number to the clinic Dept: [phone] and follow the prompts.  For any non-urgent questions, you may also contact your provider using MyChart. We now offer e-Visits for anyone 9 and older to request care online for non-urgent symptoms. For details visit [email or web address].   Also download the MyChart app! Go to the app store, search "MyChart", open the app, select [facility], and log in with your MyChart username and password.  Due to Covid, a mask is required upon entering the hospital/clinic. If you do not have a mask, one will be given to you upon arrival. For doctor visits, patients may have 1 support person aged 76 or older with them. For treatment visits, patients cannot have anyone with them due to current Covid guidelines and our immunocompromised population.

## 2021-05-27 DIAGNOSIS — Z23 Encounter for immunization: Secondary | ICD-10-CM | POA: Diagnosis not present

## 2021-06-04 MED FILL — Trastuzumab-anns For IV Soln 150 MG: INTRAVENOUS | Qty: 19 | Status: AC

## 2021-06-07 ENCOUNTER — Other Ambulatory Visit: Payer: Self-pay

## 2021-06-07 ENCOUNTER — Inpatient Hospital Stay: Payer: Medicare Other | Attending: Oncology

## 2021-06-07 VITALS — BP 177/73 | HR 59 | Temp 98.1°F | Resp 18 | Ht 66.0 in | Wt 145.8 lb

## 2021-06-07 DIAGNOSIS — Z5112 Encounter for antineoplastic immunotherapy: Secondary | ICD-10-CM | POA: Diagnosis not present

## 2021-06-07 DIAGNOSIS — Z79899 Other long term (current) drug therapy: Secondary | ICD-10-CM | POA: Insufficient documentation

## 2021-06-07 DIAGNOSIS — Z170 Estrogen receptor positive status [ER+]: Secondary | ICD-10-CM | POA: Insufficient documentation

## 2021-06-07 DIAGNOSIS — C787 Secondary malignant neoplasm of liver and intrahepatic bile duct: Secondary | ICD-10-CM | POA: Diagnosis not present

## 2021-06-07 DIAGNOSIS — C50912 Malignant neoplasm of unspecified site of left female breast: Secondary | ICD-10-CM | POA: Insufficient documentation

## 2021-06-07 MED ORDER — TRASTUZUMAB-ANNS CHEMO 150 MG IV SOLR
6.0000 mg/kg | Freq: Once | INTRAVENOUS | Status: AC
  Administered 2021-06-07: 399 mg via INTRAVENOUS
  Filled 2021-06-07: qty 19

## 2021-06-07 MED ORDER — SODIUM CHLORIDE 0.9 % IV SOLN: Freq: Once | INTRAVENOUS | Status: AC

## 2021-06-07 NOTE — Patient Instructions (Signed)
Trastuzumab injection for infusion What is this medication? TRASTUZUMAB (tras TOO zoo mab) is a monoclonal antibody. It is used to treat breast cancer and stomach cancer. This medicine may be used for other purposes; ask your health care provider or pharmacist if you have questions. COMMON BRAND NAME(S): Herceptin, Janae Bridgeman, Ontruzant, Trazimera What should I tell my care team before I take this medication? They need to know if you have any of these conditions: heart disease heart failure lung or breathing disease, like asthma an unusual or allergic reaction to trastuzumab, benzyl alcohol, or other medications, foods, dyes, or preservatives pregnant or trying to get pregnant breast-feeding How should I use this medication? This drug is given as an infusion into a vein. It is administered in a hospital or clinic by a specially trained health care professional. Talk to your pediatrician regarding the use of this medicine in children. This medicine is not approved for use in children. Overdosage: If you think you have taken too much of this medicine contact a poison control center or emergency room at once. NOTE: This medicine is only for you. Do not share this medicine with others. What if I miss a dose? It is important not to miss a dose. Call your doctor or health care professional if you are unable to keep an appointment. What may interact with this medication? This medicine may interact with the following medications: certain types of chemotherapy, such as daunorubicin, doxorubicin, epirubicin, and idarubicin This list may not describe all possible interactions. Give your health care provider a list of all the medicines, herbs, non-prescription drugs, or dietary supplements you use. Also tell them if you smoke, drink alcohol, or use illegal drugs. Some items may interact with your medicine. What should I watch for while using this medication? Visit your doctor for checks  on your progress. Report any side effects. Continue your course of treatment even though you feel ill unless your doctor tells you to stop. Call your doctor or health care professional for advice if you get a fever, chills or sore throat, or other symptoms of a cold or flu. Do not treat yourself. Try to avoid being around people who are sick. You may experience fever, chills and shaking during your first infusion. These effects are usually mild and can be treated with other medicines. Report any side effects during the infusion to your health care professional. Fever and chills usually do not happen with later infusions. Do not become pregnant while taking this medicine or for 7 months after stopping it. Women should inform their doctor if they wish to become pregnant or think they might be pregnant. Women of child-bearing potential will need to have a negative pregnancy test before starting this medicine. There is a potential for serious side effects to an unborn child. Talk to your health care professional or pharmacist for more information. Do not breast-feed an infant while taking this medicine or for 7 months after stopping it. Women must use effective birth control with this medicine. What side effects may I notice from receiving this medication? Side effects that you should report to your doctor or health care professional as soon as possible: allergic reactions like skin rash, itching or hives, swelling of the face, lips, or tongue chest pain or palpitations cough dizziness feeling faint or lightheaded, falls fever general ill feeling or flu-like symptoms signs of worsening heart failure like breathing problems; swelling in your legs and feet unusually weak or tired Side effects that usually  do not require medical attention (report to your doctor or health care professional if they continue or are bothersome): bone pain changes in taste diarrhea joint pain nausea/vomiting weight  loss This list may not describe all possible side effects. Call your doctor for medical advice about side effects. You may report side effects to FDA at 1-800-FDA-1088. Where should I keep my medication? This drug is given in a hospital or clinic and will not be stored at home. NOTE: This sheet is a summary. It may not cover all possible information. If you have questions about this medicine, talk to your doctor, pharmacist, or health care provider.  2022 Elsevier/Gold Standard (2016-08-02 14:37:52)

## 2021-06-18 ENCOUNTER — Encounter: Payer: Self-pay | Admitting: Oncology

## 2021-06-24 MED FILL — Trastuzumab-anns For IV Soln 150 MG: INTRAVENOUS | Qty: 19 | Status: AC

## 2021-06-25 ENCOUNTER — Inpatient Hospital Stay: Payer: Medicare Other | Attending: Oncology

## 2021-06-25 ENCOUNTER — Other Ambulatory Visit: Payer: Self-pay

## 2021-06-25 VITALS — BP 154/70 | HR 58 | Temp 97.6°F | Resp 18 | Ht 66.0 in | Wt 145.5 lb

## 2021-06-25 DIAGNOSIS — Z5112 Encounter for antineoplastic immunotherapy: Secondary | ICD-10-CM | POA: Diagnosis not present

## 2021-06-25 DIAGNOSIS — C50912 Malignant neoplasm of unspecified site of left female breast: Secondary | ICD-10-CM | POA: Insufficient documentation

## 2021-06-25 DIAGNOSIS — C787 Secondary malignant neoplasm of liver and intrahepatic bile duct: Secondary | ICD-10-CM | POA: Diagnosis not present

## 2021-06-25 MED ORDER — SODIUM CHLORIDE 0.9 % IV SOLN
Freq: Once | INTRAVENOUS | Status: AC
Start: 2021-06-25 — End: 2021-06-25

## 2021-06-25 MED ORDER — TRASTUZUMAB-ANNS CHEMO 150 MG IV SOLR
6.0000 mg/kg | Freq: Once | INTRAVENOUS | Status: AC
  Administered 2021-06-25: 399 mg via INTRAVENOUS
  Filled 2021-06-25: qty 19

## 2021-06-25 NOTE — Patient Instructions (Signed)
[facility]  Discharge Instructions: Thank you for choosing [facility] to provide your oncology and hematology care.  If you have a lab appointment with the [facility], please go directly to the [facility] and check in at the registration area.   Wear comfortable clothing and clothing appropriate for easy access to any Portacath or PICC line.   We strive to give you quality time with your provider. You may need to reschedule your appointment if you arrive late (15 or more minutes).  Arriving late affects you and other patients whose appointments are after yours.  Also, if you miss three or more appointments without notifying the office, you may be dismissed from the clinic at the provider's discretion.      For prescription refill requests, have your pharmacy contact our office and allow 72 hours for refills to be completed.    Today you received the following chemotherapy and/or immunotherapy agents Trastuzumab     To help prevent nausea and vomiting after your treatment, we encourage you to take your nausea medication as directed.  BELOW ARE SYMPTOMS THAT SHOULD BE REPORTED IMMEDIATELY: *FEVER GREATER THAN 100.4 F (38 C) OR HIGHER *CHILLS OR SWEATING *NAUSEA AND VOMITING THAT IS NOT CONTROLLED WITH YOUR NAUSEA MEDICATION *UNUSUAL SHORTNESS OF BREATH *UNUSUAL BRUISING OR BLEEDING *URINARY PROBLEMS (pain or burning when urinating, or frequent urination) *BOWEL PROBLEMS (unusual diarrhea, constipation, pain near the anus) TENDERNESS IN MOUTH AND THROAT WITH OR WITHOUT PRESENCE OF ULCERS (sore throat, sores in mouth, or a toothache) UNUSUAL RASH, SWELLING OR PAIN  UNUSUAL VAGINAL DISCHARGE OR ITCHING   Items with * indicate a potential emergency and should be followed up as soon as possible or go to the Emergency Department if any problems should occur.  Please show the CHEMOTHERAPY ALERT CARD or IMMUNOTHERAPY ALERT CARD at check-in to the  Emergency Department and triage nurse.  Should you have questions after your visit or need to cancel or reschedule your appointment, please contact [facility]  Dept: [phone]  and follow the prompts.  Office hours are 8:00 a.m. to 4:30 p.m. Monday - Friday. Please note that voicemails left after 4:00 p.m. may not be returned until the following business day.  We are closed weekends and major holidays. You have access to a nurse at all times for urgent questions. Please call the main number to the clinic Dept: [phone] and follow the prompts.  For any non-urgent questions, you may also contact your provider using MyChart. We now offer e-Visits for anyone 75 and older to request care online for non-urgent symptoms. For details visit [email or web address].   Also download the MyChart app! Go to the app store, search "MyChart", open the app, select [facility], and log in with your MyChart username and password.  Due to Covid, a mask is required upon entering the hospital/clinic. If you do not have a mask, one will be given to you upon arrival. For doctor visits, patients may have 1 support person aged 46 or older with them. For treatment visits, patients cannot have anyone with them due to current Covid guidelines and our immunocompromised population.

## 2021-06-25 NOTE — Progress Notes (Signed)
1224:PT STABLE AT TIME OF DISCHARGE

## 2021-06-28 DIAGNOSIS — E039 Hypothyroidism, unspecified: Secondary | ICD-10-CM | POA: Diagnosis not present

## 2021-06-28 DIAGNOSIS — I10 Essential (primary) hypertension: Secondary | ICD-10-CM | POA: Diagnosis not present

## 2021-06-28 DIAGNOSIS — Z1331 Encounter for screening for depression: Secondary | ICD-10-CM | POA: Diagnosis not present

## 2021-06-28 DIAGNOSIS — Z79899 Other long term (current) drug therapy: Secondary | ICD-10-CM | POA: Diagnosis not present

## 2021-06-28 DIAGNOSIS — E785 Hyperlipidemia, unspecified: Secondary | ICD-10-CM | POA: Diagnosis not present

## 2021-06-28 DIAGNOSIS — Z0000 Encounter for general adult medical examination without abnormal findings: Secondary | ICD-10-CM | POA: Diagnosis not present

## 2021-06-28 DIAGNOSIS — M810 Age-related osteoporosis without current pathological fracture: Secondary | ICD-10-CM | POA: Diagnosis not present

## 2021-07-08 ENCOUNTER — Other Ambulatory Visit: Payer: Self-pay | Admitting: Oncology

## 2021-07-13 MED FILL — Trastuzumab-anns For IV Soln 150 MG: INTRAVENOUS | Qty: 19 | Status: AC

## 2021-07-14 ENCOUNTER — Inpatient Hospital Stay: Payer: Medicare Other

## 2021-07-14 ENCOUNTER — Other Ambulatory Visit: Payer: Self-pay

## 2021-07-14 VITALS — BP 173/77 | HR 56 | Temp 98.5°F | Resp 18 | Ht 66.0 in | Wt 142.2 lb

## 2021-07-14 DIAGNOSIS — Z5112 Encounter for antineoplastic immunotherapy: Secondary | ICD-10-CM | POA: Diagnosis not present

## 2021-07-14 DIAGNOSIS — C50912 Malignant neoplasm of unspecified site of left female breast: Secondary | ICD-10-CM | POA: Diagnosis not present

## 2021-07-14 DIAGNOSIS — C787 Secondary malignant neoplasm of liver and intrahepatic bile duct: Secondary | ICD-10-CM | POA: Diagnosis not present

## 2021-07-14 MED ORDER — SODIUM CHLORIDE 0.9 % IV SOLN: Freq: Once | INTRAVENOUS | Status: AC

## 2021-07-14 MED ORDER — TRASTUZUMAB-ANNS CHEMO 150 MG IV SOLR
6.0000 mg/kg | Freq: Once | INTRAVENOUS | Status: AC
  Administered 2021-07-14: 399 mg via INTRAVENOUS
  Filled 2021-07-14: qty 19

## 2021-07-14 NOTE — Patient Instructions (Signed)
[facility]  Discharge Instructions: Thank you for choosing [facility] to provide your oncology and hematology care.  If you have a lab appointment with the [facility], please go directly to the [facility] and check in at the registration area.   Wear comfortable clothing and clothing appropriate for easy access to any Portacath or PICC line.   We strive to give you quality time with your provider. You may need to reschedule your appointment if you arrive late (15 or more minutes).  Arriving late affects you and other patients whose appointments are after yours.  Also, if you miss three or more appointments without notifying the office, you may be dismissed from the clinic at the provider's discretion.      For prescription refill requests, have your pharmacy contact our office and allow 72 hours for refills to be completed.    Today you received the following chemotherapy and/or immunotherapy agents Trastuzumab    To help prevent nausea and vomiting after your treatment, we encourage you to take your nausea medication as directed.  BELOW ARE SYMPTOMS THAT SHOULD BE REPORTED IMMEDIATELY: *FEVER GREATER THAN 100.4 F (38 C) OR HIGHER *CHILLS OR SWEATING *NAUSEA AND VOMITING THAT IS NOT CONTROLLED WITH YOUR NAUSEA MEDICATION *UNUSUAL SHORTNESS OF BREATH *UNUSUAL BRUISING OR BLEEDING *URINARY PROBLEMS (pain or burning when urinating, or frequent urination) *BOWEL PROBLEMS (unusual diarrhea, constipation, pain near the anus) TENDERNESS IN MOUTH AND THROAT WITH OR WITHOUT PRESENCE OF ULCERS (sore throat, sores in mouth, or a toothache) UNUSUAL RASH, SWELLING OR PAIN  UNUSUAL VAGINAL DISCHARGE OR ITCHING   Items with * indicate a potential emergency and should be followed up as soon as possible or go to the Emergency Department if any problems should occur.  Please show the CHEMOTHERAPY ALERT CARD or IMMUNOTHERAPY ALERT CARD at check-in to the  Emergency Department and triage nurse.  Should you have questions after your visit or need to cancel or reschedule your appointment, please contact [facility]  Dept: [phone]  and follow the prompts.  Office hours are 8:00 a.m. to 4:30 p.m. Monday - Friday. Please note that voicemails left after 4:00 p.m. may not be returned until the following business day.  We are closed weekends and major holidays. You have access to a nurse at all times for urgent questions. Please call the main number to the clinic Dept: [phone] and follow the prompts.  For any non-urgent questions, you may also contact your provider using MyChart. We now offer e-Visits for anyone 86 and older to request care online for non-urgent symptoms. For details visit [email or web address].   Also download the MyChart app! Go to the app store, search "MyChart", open the app, select [facility], and log in with your MyChart username and password.  Due to Covid, a mask is required upon entering the hospital/clinic. If you do not have a mask, one will be given to you upon arrival. For doctor visits, patients may have 1 support person aged 74 or older with them. For treatment visits, patients cannot have anyone with them due to current Covid guidelines and our immunocompromised population.

## 2021-07-14 NOTE — Progress Notes (Signed)
1218 PT STABLE AT TIME OF DISCHARGE. 

## 2021-07-16 DIAGNOSIS — J09X2 Influenza due to identified novel influenza A virus with other respiratory manifestations: Secondary | ICD-10-CM | POA: Diagnosis not present

## 2021-07-16 DIAGNOSIS — E0500 Thyrotoxicosis with diffuse goiter without thyrotoxic crisis or storm: Secondary | ICD-10-CM | POA: Insufficient documentation

## 2021-07-16 HISTORY — DX: Thyrotoxicosis with diffuse goiter without thyrotoxic crisis or storm: E05.00

## 2021-07-21 DIAGNOSIS — Z20828 Contact with and (suspected) exposure to other viral communicable diseases: Secondary | ICD-10-CM | POA: Diagnosis not present

## 2021-07-22 ENCOUNTER — Ambulatory Visit: Payer: Medicare Other | Admitting: Specialist

## 2021-07-27 DIAGNOSIS — J40 Bronchitis, not specified as acute or chronic: Secondary | ICD-10-CM | POA: Diagnosis not present

## 2021-07-27 DIAGNOSIS — R059 Cough, unspecified: Secondary | ICD-10-CM | POA: Diagnosis not present

## 2021-07-27 DIAGNOSIS — R051 Acute cough: Secondary | ICD-10-CM | POA: Diagnosis not present

## 2021-07-27 DIAGNOSIS — Z20822 Contact with and (suspected) exposure to covid-19: Secondary | ICD-10-CM | POA: Diagnosis not present

## 2021-08-05 MED FILL — Trastuzumab-anns For IV Soln 150 MG: INTRAVENOUS | Qty: 19 | Status: AC

## 2021-08-06 ENCOUNTER — Inpatient Hospital Stay: Payer: Medicare Other | Attending: Oncology

## 2021-08-06 ENCOUNTER — Other Ambulatory Visit: Payer: Self-pay

## 2021-08-06 VITALS — BP 126/52 | HR 62 | Temp 98.9°F | Resp 18 | Ht 66.0 in | Wt 140.8 lb

## 2021-08-06 DIAGNOSIS — Z5112 Encounter for antineoplastic immunotherapy: Secondary | ICD-10-CM | POA: Diagnosis not present

## 2021-08-06 DIAGNOSIS — Z79899 Other long term (current) drug therapy: Secondary | ICD-10-CM | POA: Insufficient documentation

## 2021-08-06 DIAGNOSIS — C787 Secondary malignant neoplasm of liver and intrahepatic bile duct: Secondary | ICD-10-CM | POA: Diagnosis not present

## 2021-08-06 DIAGNOSIS — C50912 Malignant neoplasm of unspecified site of left female breast: Secondary | ICD-10-CM | POA: Insufficient documentation

## 2021-08-06 MED ORDER — SODIUM CHLORIDE 0.9 % IV SOLN: Freq: Once | INTRAVENOUS | Status: AC

## 2021-08-06 MED ORDER — TRASTUZUMAB-ANNS CHEMO 150 MG IV SOLR
6.0000 mg/kg | Freq: Once | INTRAVENOUS | Status: AC
  Administered 2021-08-06: 399 mg via INTRAVENOUS
  Filled 2021-08-06: qty 19

## 2021-08-06 NOTE — Patient Instructions (Signed)
Trastuzumab injection for infusion What is this medication? TRASTUZUMAB (tras TOO zoo mab) is a monoclonal antibody. It is used to treat breast cancer and stomach cancer. This medicine may be used for other purposes; ask your health care provider or pharmacist if you have questions. COMMON BRAND NAME(S): Herceptin, Janae Bridgeman, Ontruzant, Trazimera What should I tell my care team before I take this medication? They need to know if you have any of these conditions: heart disease heart failure lung or breathing disease, like asthma an unusual or allergic reaction to trastuzumab, benzyl alcohol, or other medications, foods, dyes, or preservatives pregnant or trying to get pregnant breast-feeding How should I use this medication? This drug is given as an infusion into a vein. It is administered in a hospital or clinic by a specially trained health care professional. Talk to your pediatrician regarding the use of this medicine in children. This medicine is not approved for use in children. Overdosage: If you think you have taken too much of this medicine contact a poison control center or emergency room at once. NOTE: This medicine is only for you. Do not share this medicine with others. What if I miss a dose? It is important not to miss a dose. Call your doctor or health care professional if you are unable to keep an appointment. What may interact with this medication? This medicine may interact with the following medications: certain types of chemotherapy, such as daunorubicin, doxorubicin, epirubicin, and idarubicin This list may not describe all possible interactions. Give your health care provider a list of all the medicines, herbs, non-prescription drugs, or dietary supplements you use. Also tell them if you smoke, drink alcohol, or use illegal drugs. Some items may interact with your medicine. What should I watch for while using this medication? Visit your doctor for checks  on your progress. Report any side effects. Continue your course of treatment even though you feel ill unless your doctor tells you to stop. Call your doctor or health care professional for advice if you get a fever, chills or sore throat, or other symptoms of a cold or flu. Do not treat yourself. Try to avoid being around people who are sick. You may experience fever, chills and shaking during your first infusion. These effects are usually mild and can be treated with other medicines. Report any side effects during the infusion to your health care professional. Fever and chills usually do not happen with later infusions. Do not become pregnant while taking this medicine or for 7 months after stopping it. Women should inform their doctor if they wish to become pregnant or think they might be pregnant. Women of child-bearing potential will need to have a negative pregnancy test before starting this medicine. There is a potential for serious side effects to an unborn child. Talk to your health care professional or pharmacist for more information. Do not breast-feed an infant while taking this medicine or for 7 months after stopping it. Women must use effective birth control with this medicine. What side effects may I notice from receiving this medication? Side effects that you should report to your doctor or health care professional as soon as possible: allergic reactions like skin rash, itching or hives, swelling of the face, lips, or tongue chest pain or palpitations cough dizziness feeling faint or lightheaded, falls fever general ill feeling or flu-like symptoms signs of worsening heart failure like breathing problems; swelling in your legs and feet unusually weak or tired Side effects that usually  do not require medical attention (report to your doctor or health care professional if they continue or are bothersome): bone pain changes in taste diarrhea joint pain nausea/vomiting weight  loss This list may not describe all possible side effects. Call your doctor for medical advice about side effects. You may report side effects to FDA at 1-800-FDA-1088. Where should I keep my medication? This drug is given in a hospital or clinic and will not be stored at home. NOTE: This sheet is a summary. It may not cover all possible information. If you have questions about this medicine, talk to your doctor, pharmacist, or health care provider.  2022 Elsevier/Gold Standard (2016-08-23 00:00:00)

## 2021-08-18 ENCOUNTER — Telehealth: Payer: Self-pay

## 2021-08-18 ENCOUNTER — Other Ambulatory Visit: Payer: Self-pay | Admitting: Oncology

## 2021-08-18 DIAGNOSIS — M8008XA Age-related osteoporosis with current pathological fracture, vertebra(e), initial encounter for fracture: Secondary | ICD-10-CM

## 2021-08-18 DIAGNOSIS — C50912 Malignant neoplasm of unspecified site of left female breast: Secondary | ICD-10-CM

## 2021-08-18 DIAGNOSIS — M8000XG Age-related osteoporosis with current pathological fracture, unspecified site, subsequent encounter for fracture with delayed healing: Secondary | ICD-10-CM

## 2021-08-18 NOTE — Telephone Encounter (Signed)
Pt notified, she doesn't feel as she needs to be seen sooner.   RE: f/u appt and asking if she needs bone density ordered Received: Darlina Sicilian, MD  Dairl Ponder, RN Yes, her tests and appts were delayed to March last year so will all need to be done end of March.  She is right, I added order for bone density for same day if possible. Glad to see sooner if needed.   Previous Messages   ----- Message -----  From: Dairl Ponder, RN  Sent: 08/18/2021   1:46 PM EST  To: Derwood Kaplan, MD  Subject: f/u appt and asking if she needs bone densit*   Pt LVM on nurse line thinking her f/u appt was due in Jan. When I called her back, I told her she is due for yearly exam in March 2023, along with mammogram. The scheduling ladies will contact her for those appts as it gets closer to the time. Pt asked if she needed another bone density. I told her I would notify Dr Hinton Rao and await her answer.

## 2021-08-19 ENCOUNTER — Encounter: Payer: Self-pay | Admitting: Oncology

## 2021-08-26 MED FILL — Trastuzumab-anns For IV Soln 150 MG: INTRAVENOUS | Qty: 19 | Status: AC

## 2021-08-27 ENCOUNTER — Other Ambulatory Visit: Payer: Self-pay

## 2021-08-27 ENCOUNTER — Inpatient Hospital Stay: Payer: Medicare Other | Attending: Oncology

## 2021-08-27 VITALS — BP 177/72 | HR 63 | Temp 97.3°F | Resp 18 | Ht 66.0 in | Wt 144.2 lb

## 2021-08-27 DIAGNOSIS — Z5112 Encounter for antineoplastic immunotherapy: Secondary | ICD-10-CM | POA: Insufficient documentation

## 2021-08-27 DIAGNOSIS — C50912 Malignant neoplasm of unspecified site of left female breast: Secondary | ICD-10-CM | POA: Diagnosis not present

## 2021-08-27 DIAGNOSIS — Z170 Estrogen receptor positive status [ER+]: Secondary | ICD-10-CM | POA: Insufficient documentation

## 2021-08-27 DIAGNOSIS — C787 Secondary malignant neoplasm of liver and intrahepatic bile duct: Secondary | ICD-10-CM | POA: Diagnosis not present

## 2021-08-27 MED ORDER — TRASTUZUMAB-ANNS CHEMO 150 MG IV SOLR
6.0000 mg/kg | Freq: Once | INTRAVENOUS | Status: AC
  Administered 2021-08-27: 399 mg via INTRAVENOUS
  Filled 2021-08-27: qty 19

## 2021-08-27 MED ORDER — SODIUM CHLORIDE 0.9 % IV SOLN: Freq: Once | INTRAVENOUS | Status: AC

## 2021-08-27 NOTE — Patient Instructions (Signed)
[facility]  Discharge Instructions: Thank you for choosing [facility] to provide your oncology and hematology care.  If you have a lab appointment with the [facility], please go directly to the [facility] and check in at the registration area.   Wear comfortable clothing and clothing appropriate for easy access to any Portacath or PICC line.   We strive to give you quality time with your provider. You may need to reschedule your appointment if you arrive late (15 or more minutes).  Arriving late affects you and other patients whose appointments are after yours.  Also, if you miss three or more appointments without notifying the office, you may be dismissed from the clinic at the providers discretion.      For prescription refill requests, have your pharmacy contact our office and allow 72 hours for refills to be completed.    Today you received the following chemotherapy and/or immunotherapy agents Trastuzumab     To help prevent nausea and vomiting after your treatment, we encourage you to take your nausea medication as directed.  BELOW ARE SYMPTOMS THAT SHOULD BE REPORTED IMMEDIATELY: *FEVER GREATER THAN 100.4 F (38 C) OR HIGHER *CHILLS OR SWEATING *NAUSEA AND VOMITING THAT IS NOT CONTROLLED WITH YOUR NAUSEA MEDICATION *UNUSUAL SHORTNESS OF BREATH *UNUSUAL BRUISING OR BLEEDING *URINARY PROBLEMS (pain or burning when urinating, or frequent urination) *BOWEL PROBLEMS (unusual diarrhea, constipation, pain near the anus) TENDERNESS IN MOUTH AND THROAT WITH OR WITHOUT PRESENCE OF ULCERS (sore throat, sores in mouth, or a toothache) UNUSUAL RASH, SWELLING OR PAIN  UNUSUAL VAGINAL DISCHARGE OR ITCHING   Items with * indicate a potential emergency and should be followed up as soon as possible or go to the Emergency Department if any problems should occur.  Please show the CHEMOTHERAPY ALERT CARD or IMMUNOTHERAPY ALERT CARD at check-in to the  Emergency Department and triage nurse.  Should you have questions after your visit or need to cancel or reschedule your appointment, please contact [facility]  Dept: [phone]  and follow the prompts.  Office hours are 8:00 a.m. to 4:30 p.m. Monday - Friday. Please note that voicemails left after 4:00 p.m. may not be returned until the following business day.  We are closed weekends and major holidays. You have access to a nurse at all times for urgent questions. Please call the main number to the clinic Dept: [phone] and follow the prompts.  For any non-urgent questions, you may also contact your provider using MyChart. We now offer e-Visits for anyone 83 and older to request care online for non-urgent symptoms. For details visit [email or web address].   Also download the MyChart app! Go to the app store, search "MyChart", open the app, select [facility], and log in with your MyChart username and password.  Due to Covid, a mask is required upon entering the hospital/clinic. If you do not have a mask, one will be given to you upon arrival. For doctor visits, patients may have 1 support person aged 52 or older with them. For treatment visits, patients cannot have anyone with them due to current Covid guidelines and our immunocompromised population.

## 2021-08-27 NOTE — Progress Notes (Signed)
1110:Premeds at home at 1045 per patient. 1220:PT STABLE AT TIME OF DISCHARGE

## 2021-09-16 MED FILL — Trastuzumab-anns For IV Soln 150 MG: INTRAVENOUS | Qty: 19 | Status: AC

## 2021-09-17 ENCOUNTER — Other Ambulatory Visit: Payer: Self-pay | Admitting: Hematology and Oncology

## 2021-09-17 ENCOUNTER — Other Ambulatory Visit: Payer: Self-pay

## 2021-09-17 ENCOUNTER — Inpatient Hospital Stay: Payer: Medicare Other

## 2021-09-17 VITALS — BP 148/57 | HR 58 | Temp 97.9°F | Resp 18 | Ht 66.0 in | Wt 141.2 lb

## 2021-09-17 DIAGNOSIS — C787 Secondary malignant neoplasm of liver and intrahepatic bile duct: Secondary | ICD-10-CM | POA: Diagnosis not present

## 2021-09-17 DIAGNOSIS — C50912 Malignant neoplasm of unspecified site of left female breast: Secondary | ICD-10-CM

## 2021-09-17 DIAGNOSIS — Z5112 Encounter for antineoplastic immunotherapy: Secondary | ICD-10-CM | POA: Diagnosis not present

## 2021-09-17 DIAGNOSIS — Z170 Estrogen receptor positive status [ER+]: Secondary | ICD-10-CM | POA: Diagnosis not present

## 2021-09-17 MED ORDER — SODIUM CHLORIDE 0.9 % IV SOLN: Freq: Once | INTRAVENOUS | Status: AC

## 2021-09-17 MED ORDER — MAGIC MOUTHWASH W/LIDOCAINE: 5.0000 mL | Freq: Four times a day (QID) | ORAL | 3 refills | Status: DC | PRN

## 2021-09-17 MED ORDER — TRASTUZUMAB-ANNS CHEMO 150 MG IV SOLR
6.0000 mg/kg | Freq: Once | INTRAVENOUS | Status: AC
  Administered 2021-09-17: 399 mg via INTRAVENOUS
  Filled 2021-09-17: qty 19

## 2021-09-17 NOTE — Patient Instructions (Signed)
[facility]  Discharge Instructions: Thank you for choosing [facility] to provide your oncology and hematology care.  If you have a lab appointment with the [facility], please go directly to the [facility] and check in at the registration area.   Wear comfortable clothing and clothing appropriate for easy access to any Portacath or PICC line.   We strive to give you quality time with your provider. You may need to reschedule your appointment if you arrive late (15 or more minutes).  Arriving late affects you and other patients whose appointments are after yours.  Also, if you miss three or more appointments without notifying the office, you may be dismissed from the clinic at the providers discretion.      For prescription refill requests, have your pharmacy contact our office and allow 72 hours for refills to be completed.    Today you received the following chemotherapy and/or immunotherapy agents Trastuzumab   To help prevent nausea and vomiting after your treatment, we encourage you to take your nausea medication as directed.  BELOW ARE SYMPTOMS THAT SHOULD BE REPORTED IMMEDIATELY: *FEVER GREATER THAN 100.4 F (38 C) OR HIGHER *CHILLS OR SWEATING *NAUSEA AND VOMITING THAT IS NOT CONTROLLED WITH YOUR NAUSEA MEDICATION *UNUSUAL SHORTNESS OF BREATH *UNUSUAL BRUISING OR BLEEDING *URINARY PROBLEMS (pain or burning when urinating, or frequent urination) *BOWEL PROBLEMS (unusual diarrhea, constipation, pain near the anus) TENDERNESS IN MOUTH AND THROAT WITH OR WITHOUT PRESENCE OF ULCERS (sore throat, sores in mouth, or a toothache) UNUSUAL RASH, SWELLING OR PAIN  UNUSUAL VAGINAL DISCHARGE OR ITCHING   Items with * indicate a potential emergency and should be followed up as soon as possible or go to the Emergency Department if any problems should occur.  Please show the CHEMOTHERAPY ALERT CARD or IMMUNOTHERAPY ALERT CARD at check-in to the  Emergency Department and triage nurse.  Should you have questions after your visit or need to cancel or reschedule your appointment, please contact [facility]  Dept: [phone]  and follow the prompts.  Office hours are 8:00 a.m. to 4:30 p.m. Monday - Friday. Please note that voicemails left after 4:00 p.m. may not be returned until the following business day.  We are closed weekends and major holidays. You have access to a nurse at all times for urgent questions. Please call the main number to the clinic Dept: [phone] and follow the prompts.  For any non-urgent questions, you may also contact your provider using MyChart. We now offer e-Visits for anyone 34 and older to request care online for non-urgent symptoms. For details visit [email or web address].   Also download the MyChart app! Go to the app store, search "MyChart", open the app, select [facility], and log in with your MyChart username and password.  Due to Covid, a mask is required upon entering the hospital/clinic. If you do not have a mask, one will be given to you upon arrival. For doctor visits, patients may have 1 support person aged 32 or older with them. For treatment visits, patients cannot have anyone with them due to current Covid guidelines and our immunocompromised population.

## 2021-09-17 NOTE — Progress Notes (Signed)
1226:PT STABLE AT TIME OF DISCHARGE

## 2021-09-20 ENCOUNTER — Telehealth: Payer: Self-pay

## 2021-09-20 NOTE — Telephone Encounter (Signed)
na

## 2021-09-22 DIAGNOSIS — L719 Rosacea, unspecified: Secondary | ICD-10-CM | POA: Diagnosis not present

## 2021-09-22 DIAGNOSIS — I10 Essential (primary) hypertension: Secondary | ICD-10-CM | POA: Diagnosis not present

## 2021-09-22 DIAGNOSIS — K219 Gastro-esophageal reflux disease without esophagitis: Secondary | ICD-10-CM | POA: Diagnosis not present

## 2021-09-22 DIAGNOSIS — I700 Atherosclerosis of aorta: Secondary | ICD-10-CM | POA: Diagnosis not present

## 2021-09-22 DIAGNOSIS — E039 Hypothyroidism, unspecified: Secondary | ICD-10-CM | POA: Diagnosis not present

## 2021-09-22 DIAGNOSIS — Z6825 Body mass index (BMI) 25.0-25.9, adult: Secondary | ICD-10-CM | POA: Diagnosis not present

## 2021-09-22 DIAGNOSIS — F419 Anxiety disorder, unspecified: Secondary | ICD-10-CM | POA: Diagnosis not present

## 2021-09-22 DIAGNOSIS — R2681 Unsteadiness on feet: Secondary | ICD-10-CM | POA: Diagnosis not present

## 2021-09-22 DIAGNOSIS — M5137 Other intervertebral disc degeneration, lumbosacral region: Secondary | ICD-10-CM | POA: Diagnosis not present

## 2021-09-30 ENCOUNTER — Encounter: Payer: Self-pay | Admitting: Specialist

## 2021-09-30 ENCOUNTER — Ambulatory Visit: Payer: Self-pay

## 2021-09-30 ENCOUNTER — Ambulatory Visit (INDEPENDENT_AMBULATORY_CARE_PROVIDER_SITE_OTHER): Payer: Medicare Other | Admitting: Specialist

## 2021-09-30 ENCOUNTER — Other Ambulatory Visit: Payer: Self-pay

## 2021-09-30 VITALS — BP 149/77 | HR 62 | Ht 60.0 in | Wt 141.0 lb

## 2021-09-30 DIAGNOSIS — K130 Diseases of lips: Secondary | ICD-10-CM | POA: Diagnosis not present

## 2021-09-30 DIAGNOSIS — W19XXXD Unspecified fall, subsequent encounter: Secondary | ICD-10-CM | POA: Diagnosis not present

## 2021-09-30 DIAGNOSIS — S83411A Sprain of medial collateral ligament of right knee, initial encounter: Secondary | ICD-10-CM

## 2021-09-30 DIAGNOSIS — M25561 Pain in right knee: Secondary | ICD-10-CM | POA: Diagnosis not present

## 2021-09-30 NOTE — Patient Instructions (Signed)
The right knee has a sprain of the medial collateral ligament. Usually requires 6 week to heal and can result in stiffness of the knee.Send to [facility] in [place] on [address]. May weight bear as tolerated on the right knee.

## 2021-09-30 NOTE — Progress Notes (Signed)
Office Visit Note   Patient: Karen Allison           Date of Birth: [birth year]           MRN: [id number] Visit Date: 09/30/2021              Requested by: [name], MD [address] [place],   [id number] PCP: [name], MD   Assessment & Plan: Visit Diagnoses:  1. Acute pain of right knee   2. Fall, subsequent encounter     Plan: The right knee has a sprain of the medial collateral ligament. Usually requires 6 week to heal and can result in stiffness of the knee.Send to [facility] in [place] on [address]. May weight bear as tolerated on the right knee.   Follow-Up Instructions: Return in about 4 weeks (around 10/28/2021).   Orders:  Orders Placed This Encounter  Procedures   XR KNEE 3 VIEW RIGHT   No orders of the defined types were placed in this encounter.     Procedures: No procedures performed   Clinical Data: No additional findings.   Subjective: No chief complaint on file.   76 year old female with history of lumbar decompression and fusion surgeries. She uses a walker to get around and apparently fell about 6 weeks ago landing on the right knee. She is experiencing right knee stiffness and difficulty flexing the right knee.    Review of Systems  Constitutional: Negative.   HENT: Negative.    Eyes: Negative.   Respiratory: Negative.    Cardiovascular: Negative.   Gastrointestinal: Negative.   Endocrine: Negative.   Genitourinary: Negative.   Musculoskeletal: Negative.   Skin: Negative.   Allergic/Immunologic: Negative.   Neurological: Negative.   Hematological: Negative.   Psychiatric/Behavioral: Negative.      Objective: Vital Signs: BP (!) 149/77 (BP Location: Left Arm, Patient Position: Sitting)    Pulse 62    Ht 5' (1.524 m)    Wt 141 lb (64 kg)    BMI 27.54 kg/m   Physical Exam Constitutional:      Appearance: She is well-developed.  HENT:     Head: Normocephalic and atraumatic.  Eyes:     Pupils:  Pupils are equal, round, and reactive to light.  Pulmonary:     Effort: Pulmonary effort is normal.     Breath sounds: Normal breath sounds.  Abdominal:     General: Bowel sounds are normal.     Palpations: Abdomen is soft.  Musculoskeletal:        General: Normal range of motion.     Cervical back: Normal range of motion and neck supple.  Skin:    General: Skin is warm and dry.  Neurological:     Mental Status: She is alert and oriented to person, place, and time.  Psychiatric:        Behavior: Behavior normal.        Thought Content: Thought content normal.        Judgment: Judgment normal.    Right Knee Exam   Tenderness  The patient is experiencing tenderness in the medial retinaculum and lateral joint line.  Range of Motion  Extension:  -5  Flexion:  100   Tests   Valgus: positive Lachman:  Anterior - 1+    Posterior - 1+ Drawer:  Anterior - 1+    Posterior - 1+ Pivot shift: negative Patellar apprehension: negative  Other  Erythema: absent Scars: absent  Comments:  Right MCL tenderness     Specialty Comments:  No specialty comments available.  Imaging: No results found.   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[facility]) 06/05/2020   Malignant neoplasm of left breast ([facility]) 06/05/2020   Gait disorder 07/05/2016   HCAP (healthcare-associated pneumonia) 05/09/2014   Acute respiratory failure with hypoxia ([facility]) 05/09/2014   Pneumonia 05/09/2014   Hypertension    Hypothyroidism    Cervical stenosis (uterine cervix) 05/07/2014   HNP (herniated nucleus pulposus), cervical 05/06/2014   Adult  hypothyroidism 05/03/2013   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   Past Medical History:  Diagnosis Date   Arthritis    Blood dyscrasia    bleeds and bruises easily   Breast cancer ([facility])    left   Gait disorder 07/05/2016   Hepatitis     Hep A years ago   Hypertension    Hypothyroidism    Graves disease, age 31 yo   Nose trouble    right nostril will hemorrhage at times   Pneumonia    Rosacea    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[facility])     Family History  Problem Relation Age of Onset   Congestive Heart Failure Mother    Heart disease Father    Tongue cancer Grandchild     Past Surgical History:  Procedure Laterality Date   ABDOMINAL HYSTERECTOMY     Age 49   ANTERIOR CERVICAL DECOMP/DISCECTOMY FUSION N/A 05/06/2014   Procedure: ANTERIOR CERVICAL DISCECTOMY FUSION C3-4 with transgraft, local bone graft, plate and screws;  Surgeon: Jessy Oto, MD;  Location: [facility];  Service: Orthopedics;  Laterality: N/A;   APPENDECTOMY     BREAST LUMPECTOMY Left    COLONOSCOPY     EYE SURGERY Bilateral    cataracts   LUMBAR SPINE SURGERY  2011   Removal of hematoma   ORIF HUMERUS FRACTURE Left 2013   THORACIC SPINE SURGERY  2011   Bone cages   THYROIDECTOMY     TONSILLECTOMY     Social History   Occupational History   Occupation: Retired  Tobacco Use   Smoking status: Former    Types: Cigarettes    Quit date: 04/23/1962    Years since quitting: 59.4   Smokeless tobacco: Never   Tobacco comments:    as teenager  Substance and Sexual Activity   Alcohol use: Yes    Comment: Social   Drug use: No   Sexual activity: Not on file

## 2021-10-06 DIAGNOSIS — M25562 Pain in left knee: Secondary | ICD-10-CM | POA: Diagnosis not present

## 2021-10-06 DIAGNOSIS — M25661 Stiffness of right knee, not elsewhere classified: Secondary | ICD-10-CM | POA: Diagnosis not present

## 2021-10-06 DIAGNOSIS — M25561 Pain in right knee: Secondary | ICD-10-CM | POA: Diagnosis not present

## 2021-10-06 DIAGNOSIS — I69092 Facial weakness following nontraumatic subarachnoid hemorrhage: Secondary | ICD-10-CM | POA: Diagnosis not present

## 2021-10-07 ENCOUNTER — Other Ambulatory Visit: Payer: Self-pay | Admitting: Oncology

## 2021-10-07 DIAGNOSIS — C50912 Malignant neoplasm of unspecified site of left female breast: Secondary | ICD-10-CM

## 2021-10-07 DIAGNOSIS — Z1231 Encounter for screening mammogram for malignant neoplasm of breast: Secondary | ICD-10-CM

## 2021-10-07 DIAGNOSIS — Z0189 Encounter for other specified special examinations: Secondary | ICD-10-CM

## 2021-10-07 DIAGNOSIS — Z170 Estrogen receptor positive status [ER+]: Secondary | ICD-10-CM

## 2021-10-07 MED FILL — Trastuzumab-anns For IV Soln 150 MG: INTRAVENOUS | Qty: 19 | Status: AC

## 2021-10-08 ENCOUNTER — Other Ambulatory Visit: Payer: Self-pay

## 2021-10-08 ENCOUNTER — Inpatient Hospital Stay: Payer: Medicare Other | Attending: Oncology

## 2021-10-08 VITALS — BP 144/91 | HR 51 | Temp 97.9°F | Resp 18 | Ht 60.0 in | Wt 145.0 lb

## 2021-10-08 DIAGNOSIS — C50912 Malignant neoplasm of unspecified site of left female breast: Secondary | ICD-10-CM | POA: Insufficient documentation

## 2021-10-08 DIAGNOSIS — C787 Secondary malignant neoplasm of liver and intrahepatic bile duct: Secondary | ICD-10-CM | POA: Diagnosis not present

## 2021-10-08 DIAGNOSIS — Z79899 Other long term (current) drug therapy: Secondary | ICD-10-CM | POA: Insufficient documentation

## 2021-10-08 DIAGNOSIS — Z170 Estrogen receptor positive status [ER+]: Secondary | ICD-10-CM | POA: Insufficient documentation

## 2021-10-08 DIAGNOSIS — Z20822 Contact with and (suspected) exposure to covid-19: Secondary | ICD-10-CM | POA: Diagnosis not present

## 2021-10-08 DIAGNOSIS — Z5112 Encounter for antineoplastic immunotherapy: Secondary | ICD-10-CM | POA: Diagnosis not present

## 2021-10-08 MED ORDER — TRASTUZUMAB-ANNS CHEMO 150 MG IV SOLR
6.0000 mg/kg | Freq: Once | INTRAVENOUS | Status: AC
  Administered 2021-10-08: 399 mg via INTRAVENOUS
  Filled 2021-10-08: qty 19

## 2021-10-08 MED ORDER — SODIUM CHLORIDE 0.9 % IV SOLN: Freq: Once | INTRAVENOUS | Status: AC

## 2021-10-08 NOTE — Patient Instructions (Signed)
[facility]  Discharge Instructions: Thank you for choosing [facility] to provide your oncology and hematology care.  If you have a lab appointment with the [facility], please go directly to the [facility] and check in at the registration area.   Wear comfortable clothing and clothing appropriate for easy access to any Portacath or PICC line.   We strive to give you quality time with your provider. You may need to reschedule your appointment if you arrive late (15 or more minutes).  Arriving late affects you and other patients whose appointments are after yours.  Also, if you miss three or more appointments without notifying the office, you may be dismissed from the clinic at the providers discretion.      For prescription refill requests, have your pharmacy contact our office and allow 72 hours for refills to be completed.    Today you received the following chemotherapy and/or immunotherapy agents Trastuzumab   To help prevent nausea and vomiting after your treatment, we encourage you to take your nausea medication as directed.  BELOW ARE SYMPTOMS THAT SHOULD BE REPORTED IMMEDIATELY: *FEVER GREATER THAN 100.4 F (38 C) OR HIGHER *CHILLS OR SWEATING *NAUSEA AND VOMITING THAT IS NOT CONTROLLED WITH YOUR NAUSEA MEDICATION *UNUSUAL SHORTNESS OF BREATH *UNUSUAL BRUISING OR BLEEDING *URINARY PROBLEMS (pain or burning when urinating, or frequent urination) *BOWEL PROBLEMS (unusual diarrhea, constipation, pain near the anus) TENDERNESS IN MOUTH AND THROAT WITH OR WITHOUT PRESENCE OF ULCERS (sore throat, sores in mouth, or a toothache) UNUSUAL RASH, SWELLING OR PAIN  UNUSUAL VAGINAL DISCHARGE OR ITCHING   Items with * indicate a potential emergency and should be followed up as soon as possible or go to the Emergency Department if any problems should occur.  Please show the CHEMOTHERAPY ALERT CARD or IMMUNOTHERAPY ALERT CARD at check-in to the  Emergency Department and triage nurse.  Should you have questions after your visit or need to cancel or reschedule your appointment, please contact [facility]  Dept: [phone]  and follow the prompts.  Office hours are 8:00 a.m. to 4:30 p.m. Monday - Friday. Please note that voicemails left after 4:00 p.m. may not be returned until the following business day.  We are closed weekends and major holidays. You have access to a nurse at all times for urgent questions. Please call the main number to the clinic Dept: [phone] and follow the prompts.  For any non-urgent questions, you may also contact your provider using MyChart. We now offer e-Visits for anyone 28 and older to request care online for non-urgent symptoms. For details visit [email or web address].   Also download the MyChart app! Go to the app store, search "MyChart", open the app, select [facility], and log in with your MyChart username and password.  Due to Covid, a mask is required upon entering the hospital/clinic. If you do not have a mask, one will be given to you upon arrival. For doctor visits, patients may have 1 support person aged 71 or older with them. For treatment visits, patients cannot have anyone with them due to current Covid guidelines and our immunocompromised population.

## 2021-10-08 NOTE — Progress Notes (Signed)
1210:PT STABLE AT TIME OF DISCHARGE

## 2021-10-11 DIAGNOSIS — M25562 Pain in left knee: Secondary | ICD-10-CM | POA: Diagnosis not present

## 2021-10-11 DIAGNOSIS — M25561 Pain in right knee: Secondary | ICD-10-CM | POA: Diagnosis not present

## 2021-10-11 DIAGNOSIS — M25661 Stiffness of right knee, not elsewhere classified: Secondary | ICD-10-CM | POA: Diagnosis not present

## 2021-10-11 DIAGNOSIS — I69092 Facial weakness following nontraumatic subarachnoid hemorrhage: Secondary | ICD-10-CM | POA: Diagnosis not present

## 2021-10-14 ENCOUNTER — Other Ambulatory Visit: Payer: Self-pay

## 2021-10-14 ENCOUNTER — Ambulatory Visit (INDEPENDENT_AMBULATORY_CARE_PROVIDER_SITE_OTHER): Payer: Medicare Other

## 2021-10-14 DIAGNOSIS — Z0189 Encounter for other specified special examinations: Secondary | ICD-10-CM

## 2021-10-14 DIAGNOSIS — C50912 Malignant neoplasm of unspecified site of left female breast: Secondary | ICD-10-CM

## 2021-10-14 DIAGNOSIS — Z170 Estrogen receptor positive status [ER+]: Secondary | ICD-10-CM

## 2021-10-14 LAB — ECHOCARDIOGRAM COMPLETE
Area-P 1/2: 4.17 cm2
S' Lateral: 3.2 cm

## 2021-10-15 DIAGNOSIS — M25561 Pain in right knee: Secondary | ICD-10-CM | POA: Diagnosis not present

## 2021-10-15 DIAGNOSIS — M25661 Stiffness of right knee, not elsewhere classified: Secondary | ICD-10-CM | POA: Diagnosis not present

## 2021-10-15 DIAGNOSIS — I69092 Facial weakness following nontraumatic subarachnoid hemorrhage: Secondary | ICD-10-CM | POA: Diagnosis not present

## 2021-10-15 DIAGNOSIS — M25562 Pain in left knee: Secondary | ICD-10-CM | POA: Diagnosis not present

## 2021-10-18 DIAGNOSIS — M25561 Pain in right knee: Secondary | ICD-10-CM | POA: Diagnosis not present

## 2021-10-18 DIAGNOSIS — I69092 Facial weakness following nontraumatic subarachnoid hemorrhage: Secondary | ICD-10-CM | POA: Diagnosis not present

## 2021-10-18 DIAGNOSIS — M25661 Stiffness of right knee, not elsewhere classified: Secondary | ICD-10-CM | POA: Diagnosis not present

## 2021-10-18 DIAGNOSIS — M25562 Pain in left knee: Secondary | ICD-10-CM | POA: Diagnosis not present

## 2021-10-25 DIAGNOSIS — M25562 Pain in left knee: Secondary | ICD-10-CM | POA: Diagnosis not present

## 2021-10-25 DIAGNOSIS — M25661 Stiffness of right knee, not elsewhere classified: Secondary | ICD-10-CM | POA: Diagnosis not present

## 2021-10-25 DIAGNOSIS — I69092 Facial weakness following nontraumatic subarachnoid hemorrhage: Secondary | ICD-10-CM | POA: Diagnosis not present

## 2021-10-25 DIAGNOSIS — M25561 Pain in right knee: Secondary | ICD-10-CM | POA: Diagnosis not present

## 2021-10-27 DIAGNOSIS — M25562 Pain in left knee: Secondary | ICD-10-CM | POA: Diagnosis not present

## 2021-10-27 DIAGNOSIS — M25561 Pain in right knee: Secondary | ICD-10-CM | POA: Diagnosis not present

## 2021-10-27 DIAGNOSIS — I69092 Facial weakness following nontraumatic subarachnoid hemorrhage: Secondary | ICD-10-CM | POA: Diagnosis not present

## 2021-10-27 DIAGNOSIS — M25661 Stiffness of right knee, not elsewhere classified: Secondary | ICD-10-CM | POA: Diagnosis not present

## 2021-10-28 MED FILL — Trastuzumab-anns For IV Soln 150 MG: INTRAVENOUS | Qty: 19 | Status: AC

## 2021-10-29 ENCOUNTER — Inpatient Hospital Stay: Payer: Medicare Other | Attending: Oncology

## 2021-10-29 ENCOUNTER — Other Ambulatory Visit: Payer: Self-pay

## 2021-10-29 VITALS — BP 148/71 | HR 58 | Temp 97.8°F | Resp 18 | Wt 143.8 lb

## 2021-10-29 DIAGNOSIS — C787 Secondary malignant neoplasm of liver and intrahepatic bile duct: Secondary | ICD-10-CM | POA: Insufficient documentation

## 2021-10-29 DIAGNOSIS — C50912 Malignant neoplasm of unspecified site of left female breast: Secondary | ICD-10-CM | POA: Diagnosis not present

## 2021-10-29 DIAGNOSIS — Z170 Estrogen receptor positive status [ER+]: Secondary | ICD-10-CM | POA: Insufficient documentation

## 2021-10-29 DIAGNOSIS — Z5112 Encounter for antineoplastic immunotherapy: Secondary | ICD-10-CM | POA: Diagnosis not present

## 2021-10-29 MED ORDER — HEPARIN SOD (PORK) LOCK FLUSH 100 UNIT/ML IV SOLN: 250.0000 [IU] | Freq: Once | INTRAVENOUS | Status: DC | PRN

## 2021-10-29 MED ORDER — SODIUM CHLORIDE 0.9% FLUSH: 3.0000 mL | INTRAVENOUS | Status: DC | PRN

## 2021-10-29 MED ORDER — ALTEPLASE 2 MG IJ SOLR: 2.0000 mg | Freq: Once | INTRAMUSCULAR | Status: DC | PRN

## 2021-10-29 MED ORDER — SODIUM CHLORIDE 0.9% FLUSH: 10.0000 mL | INTRAVENOUS | Status: DC | PRN

## 2021-10-29 MED ORDER — SODIUM CHLORIDE 0.9 % IV SOLN: Freq: Once | INTRAVENOUS | Status: AC

## 2021-10-29 MED ORDER — HEPARIN SOD (PORK) LOCK FLUSH 100 UNIT/ML IV SOLN: 500.0000 [IU] | Freq: Once | INTRAVENOUS | Status: DC | PRN

## 2021-10-29 MED ORDER — TRASTUZUMAB-ANNS CHEMO 150 MG IV SOLR
6.0000 mg/kg | Freq: Once | INTRAVENOUS | Status: AC
  Administered 2021-10-29: 399 mg via INTRAVENOUS
  Filled 2021-10-29: qty 19

## 2021-10-29 NOTE — Progress Notes (Signed)
1211: PT STABLE AT TIME OF DISCHARGEPT STABLE AT TIME OF DISCHARGE ? ?

## 2021-10-29 NOTE — Patient Instructions (Signed)
Karen Allison  Discharge Instructions: ?Thank you for choosing [facility] to provide your oncology and hematology care.  ?If you have a lab appointment with the [facility], please go directly to the [facility] and check in at the registration area. ?  ?Wear comfortable clothing and clothing appropriate for easy access to any Portacath or PICC line.  ? ?We strive to give you quality time with your provider. You may need to reschedule your appointment if you arrive late (15 or more minutes).  Arriving late affects you and other patients whose appointments are after yours.  Also, if you miss three or more appointments without notifying the office, you may be dismissed from the clinic at the provider?s discretion.    ?  ?For prescription refill requests, have your pharmacy contact our office and allow 72 hours for refills to be completed.   ? ?Today you received the following chemotherapy and/or immunotherapy agents Trastuzumab  ?  ?To help prevent nausea and vomiting after your treatment, we encourage you to take your nausea medication as directed. ? ?BELOW ARE SYMPTOMS THAT SHOULD BE REPORTED IMMEDIATELY: ?*FEVER GREATER THAN 100.4 F (38 ?C) OR HIGHER ?*CHILLS OR SWEATING ?*NAUSEA AND VOMITING THAT IS NOT CONTROLLED WITH YOUR NAUSEA MEDICATION ?*UNUSUAL SHORTNESS OF BREATH ?*UNUSUAL BRUISING OR BLEEDING ?*URINARY PROBLEMS (pain or burning when urinating, or frequent urination) ?*BOWEL PROBLEMS (unusual diarrhea, constipation, pain near the anus) ?TENDERNESS IN MOUTH AND THROAT WITH OR WITHOUT PRESENCE OF ULCERS (sore throat, sores in mouth, or a toothache) ?UNUSUAL RASH, SWELLING OR PAIN  ?UNUSUAL VAGINAL DISCHARGE OR ITCHING  ? ?Items with * indicate a potential emergency and should be followed up as soon as possible or go to the Emergency Department if any problems should occur. ? ?Please show the CHEMOTHERAPY ALERT CARD or IMMUNOTHERAPY ALERT CARD at check-in to the  Emergency Department and triage nurse. ? ?Should you have questions after your visit or need to cancel or reschedule your appointment, please contact [facility]  Dept: [phone]  and follow the prompts.  Office hours are 8:00 a.m. to 4:30 p.m. Monday - Friday. Please note that voicemails left after 4:00 p.m. may not be returned until the following business day.  We are closed weekends and major holidays. You have access to a nurse at all times for urgent questions. Please call the main number to the clinic Dept: [phone] and follow the prompts. ? ?For any non-urgent questions, you may also contact your provider using MyChart. We now offer e-Visits for anyone 71 and older to request care online for non-urgent symptoms. For details visit [email or web address]. ?  ?Also download the MyChart app! Go to the app store, search "MyChart", open the app, select [facility], and log in with your MyChart username and password. ? ?Due to Covid, a mask is required upon entering the hospital/clinic. If you do not have a mask, one will be given to you upon arrival. For doctor visits, patients may have 1 support person aged 65 or older with them. For treatment visits, patients cannot have anyone with them due to current Covid guidelines and our immunocompromised population.  ? ? ?

## 2021-11-01 ENCOUNTER — Telehealth: Payer: Self-pay

## 2021-11-01 DIAGNOSIS — M25562 Pain in left knee: Secondary | ICD-10-CM | POA: Diagnosis not present

## 2021-11-01 DIAGNOSIS — M25561 Pain in right knee: Secondary | ICD-10-CM | POA: Diagnosis not present

## 2021-11-01 DIAGNOSIS — I69092 Facial weakness following nontraumatic subarachnoid hemorrhage: Secondary | ICD-10-CM | POA: Diagnosis not present

## 2021-11-01 DIAGNOSIS — M25661 Stiffness of right knee, not elsewhere classified: Secondary | ICD-10-CM | POA: Diagnosis not present

## 2021-11-01 NOTE — Telephone Encounter (Signed)
-----   Message from Derwood Kaplan, MD sent at 11/01/2021  3:13 PM EDT ----- ?Regarding: call ?I don't see that I called her about her ECHO from end of Feb.  Tell her it looks great. ? ?

## 2021-11-01 NOTE — Telephone Encounter (Signed)
Patient notified

## 2021-11-02 ENCOUNTER — Telehealth: Payer: Self-pay

## 2021-11-02 NOTE — Telephone Encounter (Signed)
-----   Message from Derwood Kaplan, MD sent at 11/01/2021  3:13 PM EDT ----- ?Regarding: call ?I don't see that I called her about her ECHO from end of Feb.  Tell her it looks great. ? ?

## 2021-11-04 ENCOUNTER — Encounter: Payer: Self-pay | Admitting: Oncology

## 2021-11-04 ENCOUNTER — Other Ambulatory Visit: Payer: Self-pay

## 2021-11-04 ENCOUNTER — Ambulatory Visit (INDEPENDENT_AMBULATORY_CARE_PROVIDER_SITE_OTHER): Payer: Medicare Other | Admitting: Specialist

## 2021-11-04 ENCOUNTER — Encounter: Payer: Self-pay | Admitting: Specialist

## 2021-11-04 VITALS — BP 180/76 | HR 72 | Ht 60.0 in | Wt 144.0 lb

## 2021-11-04 DIAGNOSIS — M4326 Fusion of spine, lumbar region: Secondary | ICD-10-CM

## 2021-11-04 DIAGNOSIS — M25561 Pain in right knee: Secondary | ICD-10-CM

## 2021-11-04 DIAGNOSIS — M19112 Post-traumatic osteoarthritis, left shoulder: Secondary | ICD-10-CM | POA: Diagnosis not present

## 2021-11-04 NOTE — Progress Notes (Signed)
Office Visit Note   Patient: Karen Allison           Date of Birth: 1945-09-24           MRN: [id number] Visit Date: 11/04/2021              Requested by: [name], MD [address] [place],  [place] [address] PCP: [name], MD   Assessment & Plan: Visit Diagnoses:  1. Acute pain of right knee   2. Fusion of lumbar spine   3. Post-traumatic osteoarthritis of left shoulder     Plan: Avoid bending, stooping and avoid lifting weights greater than 10 lbs. Avoid prolong standing and walking. Avoid frequent bending and stooping  No lifting greater than 10 lbs. May use ice or moist heat for pain. Weight loss is of benefit. Handicap license is approved. Avoid overhead lifting and overhead use of the arms. Do not lift greater than 5 lbs. Adjust head rest in vehicle to prevent hyperextension if rear ended. Take extra precautions to avoid falling.  Follow-Up Instructions: Return in about 6 months (around 05/07/2022).   Orders:  No orders of the defined types were placed in this encounter.  No orders of the defined types were placed in this encounter.     Procedures: No procedures performed   Clinical Data: No additional findings.   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 She walks with a walker. She has had cervical discectomy and fusion for disc herniation and that has done well . Has left shoulder OA that is post traumatic and has had a right scapula fracture In the past. The left shoulder is  Stiff and painful. Does not want to have left THR.     Review of Systems   Objective: Vital Signs: BP (!) 180/76 (BP Location: Right Arm, Patient Position: Sitting, Cuff  Size: Normal)   Pulse 72   Ht 5' (1.524 m)   Wt 144 lb (65.3 kg)   BMI 28.12 kg/m   Physical Exam  Right Shoulder Exam   Range of Motion  Active abduction:  30 abnormal  Passive abduction:  70 abnormal  Extension:  20 abnormal  External rotation:  40 abnormal  Forward flexion:  60 abnormal  Internal rotation 0 degrees:  abnormal  Internal rotation 90 degrees:  abnormal   Muscle Strength  Abduction: 4/5  Internal rotation: 4/5  External rotation: 4/5  Supraspinatus: 4/5  Subscapularis: 4/5  Biceps: 4/5   Tests  Apprehension: positive  Other  Erythema: absent Scars: absent Sensation: normal     Specialty Comments:  No specialty comments available.  Imaging: No results found.   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[facility]) 06/05/2020   Malignant neoplasm of left breast ([facility]) 06/05/2020   Gait disorder 07/05/2016  HCAP (healthcare-associated pneumonia) 05/09/2014   Acute respiratory failure with hypoxia ([facility]) 05/09/2014   Pneumonia 05/09/2014   Hypertension    Hypothyroidism    Cervical stenosis (uterine cervix) 05/07/2014   HNP (herniated nucleus pulposus), cervical 05/06/2014   Adult hypothyroidism 05/03/2013   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   Past Medical History:  Diagnosis Date   Arthritis    Blood dyscrasia    bleeds and bruises easily   Breast cancer ([facility])    left   Gait disorder 07/05/2016   Hepatitis     Hep A years ago   Hypertension    Hypothyroidism    Graves disease, age 75 yo   Nose trouble    right nostril will hemorrhage at times   Pneumonia    Rosacea     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[facility])     Family History  Problem Relation Age of Onset   Congestive Heart Failure Mother    Heart disease Father    Tongue cancer Grandchild     Past Surgical History:  Procedure Laterality Date   ABDOMINAL HYSTERECTOMY     Age 1   ANTERIOR CERVICAL DECOMP/DISCECTOMY FUSION N/A 05/06/2014   Procedure: ANTERIOR CERVICAL DISCECTOMY FUSION C3-4 with transgraft, local bone graft, plate and screws;  Surgeon: Jessy Oto, MD;  Location: [facility];  Service: Orthopedics;  Laterality: N/A;   APPENDECTOMY     BREAST LUMPECTOMY Left    COLONOSCOPY     EYE SURGERY Bilateral    cataracts   LUMBAR SPINE SURGERY  2011   Removal of hematoma   ORIF HUMERUS FRACTURE Left 2013   THORACIC SPINE SURGERY  2011   Bone cages   THYROIDECTOMY     TONSILLECTOMY     Social History   Occupational History   Occupation: Retired  Tobacco Use   Smoking status: Former    Types: Cigarettes    Quit date: 04/23/1962    Years since quitting: 59.5   Smokeless tobacco: Never   Tobacco comments:    as teenager  Substance and Sexual Activity   Alcohol use: Yes    Comment: Social   Drug use: No   Sexual activity: Not on file

## 2021-11-04 NOTE — Patient Instructions (Signed)
Avoid bending, stooping and avoid lifting weights greater than 10 lbs. ?Avoid prolong standing and walking. ?Avoid frequent bending and stooping  ?No lifting greater than 10 lbs. ?May use ice or moist heat for pain. ?Weight loss is of benefit. ?Handicap license is approved. ?Avoid overhead lifting and overhead use of the arms. ?Do not lift greater than 5 lbs. ?Adjust head rest in vehicle to prevent hyperextension if rear ended. ?Take extra precautions to avoid falling. ?

## 2021-11-16 NOTE — Progress Notes (Signed)
?[facility]  ?[address] ?[place],  [place]  [address] ?[phone] ? ?Clinic Day:  11/17/21 ? ?Referring physician: [name], MD ? ?CHIEF COMPLAINT:  ?CC: Stage IV breast cancer ? ?Current Treatment:  Trastuzumab IV every 3 weeks ? ? ?HISTORY OF PRESENT ILLNESS:  ?Karen Allison is a 76 y.o. 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  Estrogen and progesterone receptors were positive and her 2 was positive, and scans revealed liver metastases.  She was placed on a clinical trial at [facility] consisting of letrozole and trastuzumab, but she stopped the letrozole after several months because of severe arthralgias.  She also has known osteoarthritis, degenerative disc disease, and osteoporosis, and has had several back surgeries.  She is been on single agent trastuzumab now for over 12 years with good control of her disease.  This was stopped temporarily in 2011 but a PET scan a few months later revealed a new 9 mm lung nodule felt to represent recurrent disease, which did resolve after she was placed back on the trastuzumab.  She sees Dr. Micah Noel at [facility] yearly now, usually in July and he does scans and labs at that time.  We do perform periodic echocardiograms and her last one was in July.  She did have her last bone density scan in July of 2015, with no change, and receives Reclast every 6 months.  She had cervical disc surgery in September of 2016, and then developed a hospital-acquired pneumonia which took a long time to clear.  She had colonoscopy within the last 2 years.  She had a fall in September with 2 fractured ribs.  Her medication list was reviewed.  When I review her Reclast doses, she received this every 6 months from July of 2014 to January of 2016, and then yearly, with the last dose in January of 2017.  She was having  some tooth sensitivity but this is no longer an issue.  I did review her family history and the patient had endometrial cancer in her 76's and breast cancer in her 32's. She has a first cousin who had breast cancer and a paternal grandmother who had liver cancer, possibly metastatic.  An aunt had liver cancer.  A step uncle had multiple daughters with breast cancer.  The patient was seen in November of 2017 because of a tenderness and possible lump in the right upper outer quadrant.  A mammogram and ultrasound just revealed normal fibro fatty tissue.  She was evaluated at [facility] by Dr. Micah Noel in July of 2017 and there were some concerns about her scans.  There appeared to be a rim enhancing fluid collection intramuscular in the left vastus lateralis muscle measuring 2 cm which appeared new.  There was a new right posterior 6th rib expansile sclerotic lesion measuring 2.3 cm as well as an old healed right 5th rib fracture which had been stable from the year before.  She did have an MRI of the hip in August which was nonspecific with extensive postoperative findings in the lumbar spine as well as osteoarthritis, osteophytes, and degenerative changes.  There was a simple appearing fluid collection interposed between the iliotibial band and proximal margin of the vastus lateralis consistent with a small ganglion cyst or prior shearing injury.  We repeated the CT of the chest in January of 2018 to  follow up on the expansile lesion of the rib and this remained stable, and consistent with posttraumatic fracture.  She also fractured her right scapula after a fall and she has fractured several ribs on the right.  Her last ECHO in March 2022 remains normal, with an EF of 55 to 60%. ? ?INTERVAL HISTORY:  ?Karen Allison is here for annual follow up and is doing fairly well.  Her CBC and comprehensive metabolic profile are normal other than a sodium of 131, which is stable.  Annual bilateral mammogram from April 2022 was clear, and  she is scheduled for her mammogram on April 24 of this year.  Bone density scan from April 2022 revealed osteoporosis which is stable, with a T-score of -2.3 of the right forearm, previously -2.4.  Left femur neck measures -1.6, which is the same.  Dual femur total mean is stable at -1.5.  She continues yearly Reclast since July 2014 and so has completed 10 doses.  I feel that she can stop the Reclast at this time and we can repeat a bone density scan next year.  She is no longer going to [facility] for follow-up and request that we do her CT scans here.  She will be due in July, so I will schedule her for CT scans of chest, abdomen and pelvis as well as CBC and CMP.  She had her annual echocardiogram with [facility] cardiology this year on October 14, 2021.  Her ejection fraction is better than ever at 60 to 65%.  She has moderate increase in pulmonary artery systolic pressure.  She has mild mitral regurgitation and mild aortic regurgitation.  She does admit to some anxiety and depression.  She has fatigue which has not relieved with resting. Her  appetite is good, and she has lost 5 pounds since her last visit.  She denies fever, chills or other signs of infection.  She denies nausea, vomiting, bowel issues, or abdominal pain.  She denies sore throat, cough, dyspnea, or chest pain. ? ?REVIEW OF SYSTEMS:  ?Review of Systems  ?Constitutional: Negative.  Negative for appetite change, chills, fatigue, fever and unexpected weight change.  ?HENT:  Negative.    ?Eyes: Negative.   ?Respiratory: Negative.  Negative for chest tightness, cough, hemoptysis, shortness of breath and wheezing.   ?Cardiovascular: Negative.  Negative for chest pain, leg swelling and palpitations.  ?Gastrointestinal: Negative.  Negative for abdominal distention, abdominal pain, blood in stool, constipation, diarrhea, nausea and vomiting.  ?Endocrine: Negative.   ?Genitourinary: Negative.  Negative for difficulty urinating, dysuria, frequency and  hematuria.   ?Musculoskeletal:  Positive for back pain. Negative for arthralgias, flank pain, gait problem (with three falls this past month, using a walker) and myalgias.  ?     She has osteoporosis with many prior fractures  ?Skin: Negative.   ?Neurological:  Negative for dizziness, extremity weakness, gait problem (with three falls this past month, using a walker), headaches, light-headedness, numbness, seizures and speech difficulty.  ?Hematological: Negative.   ?Psychiatric/Behavioral:  Positive for depression. Negative for sleep disturbance. The patient is nervous/anxious.    ? ?VITALS:  ?Blood pressure (!) 161/70, pulse (!) 59, temperature (!) 97.5 ?F (36.4 ?C), temperature source Oral, resp. rate 18, height 5\' 1"  (1.549 m), weight 139 lb 3.2 oz (63.1 kg), SpO2 98 %.  ?Wt Readings from Last 3 Encounters:  ?11/19/21 139 lb 8 oz (63.3 kg)  ?11/17/21 139 lb 3.2 oz (63.1 kg)  ?11/04/21 144 lb (65.3 kg)  ?  ?  Body mass index is 26.3 kg/m?. ? ?Performance status (ECOG): 1 - Symptomatic but completely ambulatory ? ?PHYSICAL EXAM:  ?Physical Exam ?Constitutional:   ?   General: She is not in acute distress. ?   Appearance: Normal appearance. She is normal weight.  ?HENT:  ?   Head: Normocephalic and atraumatic.  ?Eyes:  ?   General: No scleral icterus. ?   Extraocular Movements: Extraocular movements intact.  ?   Conjunctiva/sclera: Conjunctivae normal.  ?   Pupils: Pupils are equal, round, and reactive to light.  ?Cardiovascular:  ?   Rate and Rhythm: Normal rate and regular rhythm.  ?   Pulses: Normal pulses.  ?   Heart sounds: Normal heart sounds. No murmur heard. ?  No friction rub. No gallop.  ?Pulmonary:  ?   Effort: Pulmonary effort is normal. No respiratory distress.  ?   Breath sounds: Normal breath sounds.  ?Chest:  ?Breasts: ?   Right: Normal.  ?   Left: Normal.  ?   Comments: She has a fading scar over the left nipple areolar complex.  No masses in either breast. ?Abdominal:  ?   General: Bowel sounds are  normal. There is no distension.  ?   Palpations: Abdomen is soft. There is no hepatomegaly, splenomegaly or mass.  ?   Tenderness: There is no abdominal tenderness.  ?Musculoskeletal:     ?   General: Nor

## 2021-11-17 ENCOUNTER — Inpatient Hospital Stay (INDEPENDENT_AMBULATORY_CARE_PROVIDER_SITE_OTHER): Payer: Medicare Other | Admitting: Oncology

## 2021-11-17 ENCOUNTER — Encounter: Payer: Self-pay | Admitting: Oncology

## 2021-11-17 ENCOUNTER — Other Ambulatory Visit: Payer: Self-pay | Admitting: Oncology

## 2021-11-17 ENCOUNTER — Other Ambulatory Visit: Payer: Self-pay

## 2021-11-17 ENCOUNTER — Other Ambulatory Visit: Payer: Self-pay | Admitting: Pharmacist

## 2021-11-17 ENCOUNTER — Inpatient Hospital Stay: Payer: Medicare Other

## 2021-11-17 VITALS — BP 161/70 | HR 59 | Temp 97.5°F | Resp 18 | Ht 61.0 in | Wt 139.2 lb

## 2021-11-17 DIAGNOSIS — E871 Hypo-osmolality and hyponatremia: Secondary | ICD-10-CM

## 2021-11-17 DIAGNOSIS — C787 Secondary malignant neoplasm of liver and intrahepatic bile duct: Secondary | ICD-10-CM

## 2021-11-17 DIAGNOSIS — M8000XG Age-related osteoporosis with current pathological fracture, unspecified site, subsequent encounter for fracture with delayed healing: Secondary | ICD-10-CM | POA: Diagnosis not present

## 2021-11-17 DIAGNOSIS — Z170 Estrogen receptor positive status [ER+]: Secondary | ICD-10-CM

## 2021-11-17 DIAGNOSIS — C50912 Malignant neoplasm of unspecified site of left female breast: Secondary | ICD-10-CM

## 2021-11-17 LAB — BASIC METABOLIC PANEL
BUN: 12 (ref 4–21)
CO2: 34 — AB (ref 13–22)
Chloride: 94 — AB (ref 99–108)
Creatinine: 0.7 (ref 0.5–1.1)
Glucose: 97
Potassium: 4 mEq/L (ref 3.5–5.1)
Sodium: 131 — AB (ref 137–147)

## 2021-11-17 LAB — COMPREHENSIVE METABOLIC PANEL
Albumin: 4.4 (ref 3.5–5.0)
Calcium: 9 (ref 8.7–10.7)

## 2021-11-17 LAB — HEPATIC FUNCTION PANEL
ALT: 22 U/L (ref 7–35)
AST: 36 — AB (ref 13–35)
Alkaline Phosphatase: 79 (ref 25–125)
Bilirubin, Total: 0.6

## 2021-11-17 LAB — CBC AND DIFFERENTIAL
HCT: 40 (ref 36–46)
Hemoglobin: 13.3 (ref 12.0–16.0)
Neutrophils Absolute: 3.48
Platelets: 172 10*3/uL (ref 150–400)
WBC: 5.7

## 2021-11-17 LAB — CBC: RBC: 4.35 (ref 3.87–5.11)

## 2021-11-18 ENCOUNTER — Telehealth: Payer: Self-pay

## 2021-11-18 NOTE — Telephone Encounter (Signed)
-----   Message from Derwood Kaplan, MD sent at 11/17/2021  3:44 PM EDT ----- ?Regarding: reclast ?Tell her she has completed 10 doses of Reclast as of last year, so doesn't need any more ? ?

## 2021-11-18 NOTE — Telephone Encounter (Signed)
Patient notified.  ? ?Patient also wants to know if there is a specific amount of time that she has to wait to start Reclast back if needed?    ?

## 2021-11-19 ENCOUNTER — Inpatient Hospital Stay: Payer: Medicare Other

## 2021-11-19 VITALS — BP 148/65 | HR 64 | Temp 98.1°F | Resp 18 | Ht 61.0 in | Wt 139.5 lb

## 2021-11-19 DIAGNOSIS — C50912 Malignant neoplasm of unspecified site of left female breast: Secondary | ICD-10-CM | POA: Diagnosis not present

## 2021-11-19 DIAGNOSIS — Z5112 Encounter for antineoplastic immunotherapy: Secondary | ICD-10-CM | POA: Diagnosis not present

## 2021-11-19 DIAGNOSIS — C787 Secondary malignant neoplasm of liver and intrahepatic bile duct: Secondary | ICD-10-CM | POA: Diagnosis not present

## 2021-11-19 DIAGNOSIS — Z170 Estrogen receptor positive status [ER+]: Secondary | ICD-10-CM | POA: Diagnosis not present

## 2021-11-19 MED ORDER — TRASTUZUMAB-ANNS CHEMO 150 MG IV SOLR
6.0000 mg/kg | Freq: Once | INTRAVENOUS | Status: AC
  Administered 2021-11-19: 399 mg via INTRAVENOUS
  Filled 2021-11-19: qty 19

## 2021-11-19 MED ORDER — SODIUM CHLORIDE 0.9 % IV SOLN: Freq: Once | INTRAVENOUS | Status: AC

## 2021-11-19 NOTE — Patient Instructions (Signed)
[facility]  Discharge Instructions: ?Thank you for choosing [facility] to provide your oncology and hematology care.  ?If you have a lab appointment with the [facility], please go directly to the [facility] and check in at the registration area. ?  ?Wear comfortable clothing and clothing appropriate for easy access to any Portacath or PICC line.  ? ?We strive to give you quality time with your provider. You may need to reschedule your appointment if you arrive late (15 or more minutes).  Arriving late affects you and other patients whose appointments are after yours.  Also, if you miss three or more appointments without notifying the office, you may be dismissed from the clinic at the provider?s discretion.    ?  ?For prescription refill requests, have your pharmacy contact our office and allow 72 hours for refills to be completed.   ? ?Today you received the following chemotherapy and/or immunotherapy agents: TRASTUZUMAB    ?  ?To help prevent nausea and vomiting after your treatment, we encourage you to take your nausea medication as directed. ? ?BELOW ARE SYMPTOMS THAT SHOULD BE REPORTED IMMEDIATELY: ?*FEVER GREATER THAN 100.4 F (38 ?C) OR HIGHER ?*CHILLS OR SWEATING ?*NAUSEA AND VOMITING THAT IS NOT CONTROLLED WITH YOUR NAUSEA MEDICATION ?*UNUSUAL SHORTNESS OF BREATH ?*UNUSUAL BRUISING OR BLEEDING ?*URINARY PROBLEMS (pain or burning when urinating, or frequent urination) ?*BOWEL PROBLEMS (unusual diarrhea, constipation, pain near the anus) ?TENDERNESS IN MOUTH AND THROAT WITH OR WITHOUT PRESENCE OF ULCERS (sore throat, sores in mouth, or a toothache) ?UNUSUAL RASH, SWELLING OR PAIN  ?UNUSUAL VAGINAL DISCHARGE OR ITCHING  ? ?Items with * indicate a potential emergency and should be followed up as soon as possible or go to the Emergency Department if any problems should occur. ? ?Please show the CHEMOTHERAPY ALERT CARD or IMMUNOTHERAPY ALERT CARD at check-in to the  Emergency Department and triage nurse. ? ?Should you have questions after your visit or need to cancel or reschedule your appointment, please contact [facility]  Dept: [phone]  and follow the prompts.  Office hours are 8:00 a.m. to 4:30 p.m. Monday - Friday. Please note that voicemails left after 4:00 p.m. may not be returned until the following business day.  We are closed weekends and major holidays. You have access to a nurse at all times for urgent questions. Please call the main number to the clinic Dept: [phone] and follow the prompts. ? ?For any non-urgent questions, you may also contact your provider using MyChart. We now offer e-Visits for anyone 16 and older to request care online for non-urgent symptoms. For details visit [email or web address]. ?  ?Also download the MyChart app! Go to the app store, search "MyChart", open the app, select [facility], and log in with your MyChart username and password. ? ?Due to Covid, a mask is required upon entering the hospital/clinic. If you do not have a mask, one will be given to you upon arrival. For doctor visits, patients may have 1 support person aged 75 or older with them. For treatment visits, patients cannot have anyone with them due to current Covid guidelines and our immunocompromised population.  ? ? ?

## 2021-11-25 ENCOUNTER — Encounter: Payer: Self-pay | Admitting: Oncology

## 2021-12-02 ENCOUNTER — Encounter: Payer: Self-pay | Admitting: Oncology

## 2021-12-02 DIAGNOSIS — E871 Hypo-osmolality and hyponatremia: Secondary | ICD-10-CM | POA: Insufficient documentation

## 2021-12-02 HISTORY — DX: Hypo-osmolality and hyponatremia: E87.1

## 2021-12-06 DIAGNOSIS — Z20822 Contact with and (suspected) exposure to covid-19: Secondary | ICD-10-CM | POA: Diagnosis not present

## 2021-12-08 DIAGNOSIS — C50912 Malignant neoplasm of unspecified site of left female breast: Secondary | ICD-10-CM | POA: Diagnosis not present

## 2021-12-08 DIAGNOSIS — Z6825 Body mass index (BMI) 25.0-25.9, adult: Secondary | ICD-10-CM | POA: Diagnosis not present

## 2021-12-08 DIAGNOSIS — F419 Anxiety disorder, unspecified: Secondary | ICD-10-CM | POA: Diagnosis not present

## 2021-12-08 DIAGNOSIS — C787 Secondary malignant neoplasm of liver and intrahepatic bile duct: Secondary | ICD-10-CM | POA: Diagnosis not present

## 2021-12-08 DIAGNOSIS — I10 Essential (primary) hypertension: Secondary | ICD-10-CM | POA: Diagnosis not present

## 2021-12-08 DIAGNOSIS — E039 Hypothyroidism, unspecified: Secondary | ICD-10-CM | POA: Diagnosis not present

## 2021-12-08 DIAGNOSIS — I700 Atherosclerosis of aorta: Secondary | ICD-10-CM | POA: Diagnosis not present

## 2021-12-08 DIAGNOSIS — Z170 Estrogen receptor positive status [ER+]: Secondary | ICD-10-CM | POA: Diagnosis not present

## 2021-12-10 ENCOUNTER — Inpatient Hospital Stay: Payer: Medicare Other | Attending: Oncology

## 2021-12-10 VITALS — BP 152/54 | HR 55 | Temp 98.1°F | Resp 18 | Ht 61.0 in | Wt 140.0 lb

## 2021-12-10 DIAGNOSIS — C787 Secondary malignant neoplasm of liver and intrahepatic bile duct: Secondary | ICD-10-CM | POA: Diagnosis not present

## 2021-12-10 DIAGNOSIS — Z5112 Encounter for antineoplastic immunotherapy: Secondary | ICD-10-CM | POA: Diagnosis not present

## 2021-12-10 DIAGNOSIS — C7800 Secondary malignant neoplasm of unspecified lung: Secondary | ICD-10-CM | POA: Insufficient documentation

## 2021-12-10 DIAGNOSIS — C50912 Malignant neoplasm of unspecified site of left female breast: Secondary | ICD-10-CM | POA: Diagnosis not present

## 2021-12-10 DIAGNOSIS — Z170 Estrogen receptor positive status [ER+]: Secondary | ICD-10-CM | POA: Diagnosis not present

## 2021-12-10 MED ORDER — SODIUM CHLORIDE 0.9 % IV SOLN: Freq: Once | INTRAVENOUS | Status: AC

## 2021-12-10 MED ORDER — TRASTUZUMAB-ANNS CHEMO 150 MG IV SOLR
6.0000 mg/kg | Freq: Once | INTRAVENOUS | Status: AC
  Administered 2021-12-10: 399 mg via INTRAVENOUS
  Filled 2021-12-10: qty 19

## 2021-12-10 NOTE — Patient Instructions (Signed)
[facility]  Discharge Instructions: ?Thank you for choosing [facility] to provide your oncology and hematology care.  ?If you have a lab appointment with the [facility], please go directly to the [facility] and check in at the registration area. ?  ?Wear comfortable clothing and clothing appropriate for easy access to any Portacath or PICC line.  ? ?We strive to give you quality time with your provider. You may need to reschedule your appointment if you arrive late (15 or more minutes).  Arriving late affects you and other patients whose appointments are after yours.  Also, if you miss three or more appointments without notifying the office, you may be dismissed from the clinic at the provider?s discretion.    ?  ?For prescription refill requests, have your pharmacy contact our office and allow 72 hours for refills to be completed.   ? ?Today you received the following chemotherapy and/or immunotherapy agents Trastuzumab    ?  ?To help prevent nausea and vomiting after your treatment, we encourage you to take your nausea medication as directed. ? ?BELOW ARE SYMPTOMS THAT SHOULD BE REPORTED IMMEDIATELY: ?*FEVER GREATER THAN 100.4 F (38 ?C) OR HIGHER ?*CHILLS OR SWEATING ?*NAUSEA AND VOMITING THAT IS NOT CONTROLLED WITH YOUR NAUSEA MEDICATION ?*UNUSUAL SHORTNESS OF BREATH ?*UNUSUAL BRUISING OR BLEEDING ?*URINARY PROBLEMS (pain or burning when urinating, or frequent urination) ?*BOWEL PROBLEMS (unusual diarrhea, constipation, pain near the anus) ?TENDERNESS IN MOUTH AND THROAT WITH OR WITHOUT PRESENCE OF ULCERS (sore throat, sores in mouth, or a toothache) ?UNUSUAL RASH, SWELLING OR PAIN  ?UNUSUAL VAGINAL DISCHARGE OR ITCHING  ? ?Items with * indicate a potential emergency and should be followed up as soon as possible or go to the Emergency Department if any problems should occur. ? ?Please show the CHEMOTHERAPY ALERT CARD or IMMUNOTHERAPY ALERT CARD at check-in to the  Emergency Department and triage nurse. ? ?Should you have questions after your visit or need to cancel or reschedule your appointment, please contact [facility]  Dept: [phone]  and follow the prompts.  Office hours are 8:00 a.m. to 4:30 p.m. Monday - Friday. Please note that voicemails left after 4:00 p.m. may not be returned until the following business day.  We are closed weekends and major holidays. You have access to a nurse at all times for urgent questions. Please call the main number to the clinic Dept: [phone] and follow the prompts. ? ?For any non-urgent questions, you may also contact your provider using MyChart. We now offer e-Visits for anyone 106 and older to request care online for non-urgent symptoms. For details visit [email or web address]. ?  ?Also download the MyChart app! Go to the app store, search "MyChart", open the app, select [facility], and log in with your MyChart username and password. ? ?Due to Covid, a mask is required upon entering the hospital/clinic. If you do not have a mask, one will be given to you upon arrival. For doctor visits, patients may have 1 support person aged 19 or older with them. For treatment visits, patients cannot have anyone with them due to current Covid guidelines and our immunocompromised population.  ? ? ?

## 2021-12-22 DIAGNOSIS — R928 Other abnormal and inconclusive findings on diagnostic imaging of breast: Secondary | ICD-10-CM | POA: Diagnosis not present

## 2021-12-22 DIAGNOSIS — Z1231 Encounter for screening mammogram for malignant neoplasm of breast: Secondary | ICD-10-CM | POA: Diagnosis not present

## 2021-12-23 ENCOUNTER — Other Ambulatory Visit: Payer: Self-pay | Admitting: Oncology

## 2021-12-23 ENCOUNTER — Encounter: Payer: Self-pay | Admitting: Oncology

## 2021-12-23 DIAGNOSIS — N6310 Unspecified lump in the right breast, unspecified quadrant: Secondary | ICD-10-CM

## 2021-12-23 DIAGNOSIS — R928 Other abnormal and inconclusive findings on diagnostic imaging of breast: Secondary | ICD-10-CM

## 2021-12-24 ENCOUNTER — Telehealth: Payer: Self-pay

## 2021-12-24 NOTE — Telephone Encounter (Signed)
Opened in error

## 2021-12-27 ENCOUNTER — Ambulatory Visit
Admission: RE | Admit: 2021-12-27 | Discharge: 2021-12-27 | Disposition: A | Discharge status: Home / Self Care | Payer: Medicare Other | Source: Ambulatory Visit | Attending: Oncology | Admitting: Oncology

## 2021-12-27 DIAGNOSIS — R928 Other abnormal and inconclusive findings on diagnostic imaging of breast: Secondary | ICD-10-CM

## 2021-12-27 DIAGNOSIS — R922 Inconclusive mammogram: Secondary | ICD-10-CM | POA: Diagnosis not present

## 2021-12-27 DIAGNOSIS — N6489 Other specified disorders of breast: Secondary | ICD-10-CM | POA: Diagnosis not present

## 2021-12-29 ENCOUNTER — Encounter: Payer: Self-pay | Admitting: Oncology

## 2021-12-31 ENCOUNTER — Telehealth: Payer: Self-pay | Admitting: Oncology

## 2021-12-31 ENCOUNTER — Inpatient Hospital Stay: Payer: Medicare Other | Attending: Oncology

## 2021-12-31 VITALS — BP 151/59 | HR 52 | Temp 97.9°F | Resp 18 | Ht 61.0 in | Wt 138.8 lb

## 2021-12-31 DIAGNOSIS — C787 Secondary malignant neoplasm of liver and intrahepatic bile duct: Secondary | ICD-10-CM | POA: Diagnosis not present

## 2021-12-31 DIAGNOSIS — C50912 Malignant neoplasm of unspecified site of left female breast: Secondary | ICD-10-CM | POA: Insufficient documentation

## 2021-12-31 DIAGNOSIS — Z5112 Encounter for antineoplastic immunotherapy: Secondary | ICD-10-CM | POA: Insufficient documentation

## 2021-12-31 DIAGNOSIS — Z170 Estrogen receptor positive status [ER+]: Secondary | ICD-10-CM | POA: Insufficient documentation

## 2021-12-31 MED ORDER — TRASTUZUMAB-ANNS CHEMO 150 MG IV SOLR
6.0000 mg/kg | Freq: Once | INTRAVENOUS | Status: AC
  Administered 2021-12-31: 399 mg via INTRAVENOUS
  Filled 2021-12-31: qty 19

## 2021-12-31 MED ORDER — SODIUM CHLORIDE 0.9 % IV SOLN: Freq: Once | INTRAVENOUS | Status: AC

## 2021-12-31 NOTE — Progress Notes (Signed)
1207:PT STABLE AT TIME OF DISCHARGE ?

## 2021-12-31 NOTE — Telephone Encounter (Signed)
Appts added, pt given appt calendars. ? ?Scheduling Message ?Entered by Ulice Dash M on 12/30/2021 at 11:32 AM ?Priority: Routine ?<No visit type provided>  ?Department: [facility]  ?Provider:   ?Appointment Notes:  ?Please schedule more kanjinti appts after 6/2 per treatment plan.  She will need infusion appt every 21 days.  No labs are required.  ?Scheduling Notes:  ? ?

## 2022-01-04 ENCOUNTER — Other Ambulatory Visit: Payer: Medicare Other

## 2022-01-20 MED FILL — Trastuzumab-anns For IV Soln 150 MG: INTRAVENOUS | Qty: 19 | Status: AC

## 2022-01-21 ENCOUNTER — Inpatient Hospital Stay: Payer: Medicare Other | Attending: Oncology

## 2022-01-21 VITALS — BP 127/62 | HR 60 | Temp 98.0°F | Resp 18 | Ht 61.0 in | Wt 138.1 lb

## 2022-01-21 DIAGNOSIS — C787 Secondary malignant neoplasm of liver and intrahepatic bile duct: Secondary | ICD-10-CM | POA: Insufficient documentation

## 2022-01-21 DIAGNOSIS — Z79899 Other long term (current) drug therapy: Secondary | ICD-10-CM | POA: Diagnosis not present

## 2022-01-21 DIAGNOSIS — F32A Depression, unspecified: Secondary | ICD-10-CM | POA: Insufficient documentation

## 2022-01-21 DIAGNOSIS — M549 Dorsalgia, unspecified: Secondary | ICD-10-CM | POA: Diagnosis not present

## 2022-01-21 DIAGNOSIS — Z5112 Encounter for antineoplastic immunotherapy: Secondary | ICD-10-CM | POA: Insufficient documentation

## 2022-01-21 DIAGNOSIS — M810 Age-related osteoporosis without current pathological fracture: Secondary | ICD-10-CM | POA: Insufficient documentation

## 2022-01-21 DIAGNOSIS — C50912 Malignant neoplasm of unspecified site of left female breast: Secondary | ICD-10-CM | POA: Diagnosis not present

## 2022-01-21 DIAGNOSIS — Z170 Estrogen receptor positive status [ER+]: Secondary | ICD-10-CM | POA: Insufficient documentation

## 2022-01-21 DIAGNOSIS — E871 Hypo-osmolality and hyponatremia: Secondary | ICD-10-CM | POA: Insufficient documentation

## 2022-01-21 MED ORDER — TRASTUZUMAB-ANNS CHEMO 150 MG IV SOLR
6.0000 mg/kg | Freq: Once | INTRAVENOUS | Status: AC
  Administered 2022-01-21: 399 mg via INTRAVENOUS
  Filled 2022-01-21: qty 19

## 2022-01-21 MED ORDER — SODIUM CHLORIDE 0.9 % IV SOLN: Freq: Once | INTRAVENOUS | Status: AC

## 2022-01-21 NOTE — Patient Instructions (Signed)
[facility]  Discharge Instructions: Thank you for choosing [facility] to provide your oncology and hematology care.  If you have a lab appointment with the [facility], please go directly to the [facility] and check in at the registration area.   Wear comfortable clothing and clothing appropriate for easy access to any Portacath or PICC line.   We strive to give you quality time with your provider. You may need to reschedule your appointment if you arrive late (15 or more minutes).  Arriving late affects you and other patients whose appointments are after yours.  Also, if you miss three or more appointments without notifying the office, you may be dismissed from the clinic at the provider's discretion.      For prescription refill requests, have your pharmacy contact our office and allow 72 hours for refills to be completed.    Today you received the following chemotherapy and/or immunotherapy agents kanjinti   To help prevent nausea and vomiting after your treatment, we encourage you to take your nausea medication as directed.  BELOW ARE SYMPTOMS THAT SHOULD BE REPORTED IMMEDIATELY: *FEVER GREATER THAN 100.4 F (38 C) OR HIGHER *CHILLS OR SWEATING *NAUSEA AND VOMITING THAT IS NOT CONTROLLED WITH YOUR NAUSEA MEDICATION *UNUSUAL SHORTNESS OF BREATH *UNUSUAL BRUISING OR BLEEDING *URINARY PROBLEMS (pain or burning when urinating, or frequent urination) *BOWEL PROBLEMS (unusual diarrhea, constipation, pain near the anus) TENDERNESS IN MOUTH AND THROAT WITH OR WITHOUT PRESENCE OF ULCERS (sore throat, sores in mouth, or a toothache) UNUSUAL RASH, SWELLING OR PAIN  UNUSUAL VAGINAL DISCHARGE OR ITCHING   Items with * indicate a potential emergency and should be followed up as soon as possible or go to the Emergency Department if any problems should occur.  Please show the CHEMOTHERAPY ALERT CARD or IMMUNOTHERAPY ALERT CARD at check-in to the  Emergency Department and triage nurse.  Should you have questions after your visit or need to cancel or reschedule your appointment, please contact [facility]  Dept: [phone]  and follow the prompts.  Office hours are 8:00 a.m. to 4:30 p.m. Monday - Friday. Please note that voicemails left after 4:00 p.m. may not be returned until the following business day.  We are closed weekends and major holidays. You have access to a nurse at all times for urgent questions. Please call the main number to the clinic Dept: [phone] and follow the prompts.  For any non-urgent questions, you may also contact your provider using MyChart. We now offer e-Visits for anyone 11 and older to request care online for non-urgent symptoms. For details visit [email or web address].   Also download the MyChart app! Go to the app store, search "MyChart", open the app, select [facility], and log in with your MyChart username and password.  Due to Covid, a mask is required upon entering the hospital/clinic. If you do not have a mask, one will be given to you upon arrival. For doctor visits, patients may have 1 support person aged 59 or older with them. For treatment visits, patients cannot have anyone with them due to current Covid guidelines and our immunocompromised population.   Trastuzumab injection for infusion What is this medication? TRASTUZUMAB (tras TOO zoo mab) is a monoclonal antibody. It is used to treat breast cancer and stomach cancer. This medicine may be used for other purposes; ask your health care provider or pharmacist if you have questions. COMMON BRAND NAME(S): Herceptin, Belenda Cruise, Ogivri, Ontruzant,  Trazimera What should I tell my care team before I take this medication? They need to know if you have any of these conditions: heart disease heart failure lung or breathing disease, like asthma an unusual or allergic reaction to trastuzumab, benzyl alcohol, or other  medications, foods, dyes, or preservatives pregnant or trying to get pregnant breast-feeding How should I use this medication? This drug is given as an infusion into a vein. It is administered in a hospital or clinic by a specially trained health care professional. Talk to your pediatrician regarding the use of this medicine in children. This medicine is not approved for use in children. Overdosage: If you think you have taken too much of this medicine contact a poison control center or emergency room at once. NOTE: This medicine is only for you. Do not share this medicine with others. What if I miss a dose? It is important not to miss a dose. Call your doctor or health care professional if you are unable to keep an appointment. What may interact with this medication? This medicine may interact with the following medications: certain types of chemotherapy, such as daunorubicin, doxorubicin, epirubicin, and idarubicin This list may not describe all possible interactions. Give your health care provider a list of all the medicines, herbs, non-prescription drugs, or dietary supplements you use. Also tell them if you smoke, drink alcohol, or use illegal drugs. Some items may interact with your medicine. What should I watch for while using this medication? Visit your doctor for checks on your progress. Report any side effects. Continue your course of treatment even though you feel ill unless your doctor tells you to stop. Call your doctor or health care professional for advice if you get a fever, chills or sore throat, or other symptoms of a cold or flu. Do not treat yourself. Try to avoid being around people who are sick. You may experience fever, chills and shaking during your first infusion. These effects are usually mild and can be treated with other medicines. Report any side effects during the infusion to your health care professional. Fever and chills usually do not happen with later infusions. Do  not become pregnant while taking this medicine or for 7 months after stopping it. Women should inform their doctor if they wish to become pregnant or think they might be pregnant. Women of child-bearing potential will need to have a negative pregnancy test before starting this medicine. There is a potential for serious side effects to an unborn child. Talk to your health care professional or pharmacist for more information. Do not breast-feed an infant while taking this medicine or for 7 months after stopping it. Women must use effective birth control with this medicine. What side effects may I notice from receiving this medication? Side effects that you should report to your doctor or health care professional as soon as possible: allergic reactions like skin rash, itching or hives, swelling of the face, lips, or tongue chest pain or palpitations cough dizziness feeling faint or lightheaded, falls fever general ill feeling or flu-like symptoms signs of worsening heart failure like breathing problems; swelling in your legs and feet unusually weak or tired Side effects that usually do not require medical attention (report to your doctor or health care professional if they continue or are bothersome): bone pain changes in taste diarrhea joint pain nausea/vomiting weight loss This list may not describe all possible side effects. Call your doctor for medical advice about side effects. You may report side effects  to FDA at 1-800-FDA-1088. Where should I keep my medication? This drug is given in a hospital or clinic and will not be stored at home. NOTE: This sheet is a summary. It may not cover all possible information. If you have questions about this medicine, talk to your doctor, pharmacist, or health care provider.  2023 Elsevier/Gold Standard (2016-08-23 00:00:00)

## 2022-02-03 ENCOUNTER — Ambulatory Visit (INDEPENDENT_AMBULATORY_CARE_PROVIDER_SITE_OTHER): Payer: Medicare Other | Admitting: Specialist

## 2022-02-03 ENCOUNTER — Encounter: Payer: Self-pay | Admitting: Specialist

## 2022-02-03 VITALS — BP 176/68 | HR 55 | Ht 61.0 in | Wt 138.1 lb

## 2022-02-03 DIAGNOSIS — M25561 Pain in right knee: Secondary | ICD-10-CM

## 2022-02-03 DIAGNOSIS — M4326 Fusion of spine, lumbar region: Secondary | ICD-10-CM

## 2022-02-03 DIAGNOSIS — R0781 Pleurodynia: Secondary | ICD-10-CM | POA: Diagnosis not present

## 2022-02-03 DIAGNOSIS — R2689 Other abnormalities of gait and mobility: Secondary | ICD-10-CM

## 2022-02-03 DIAGNOSIS — M19112 Post-traumatic osteoarthritis, left shoulder: Secondary | ICD-10-CM | POA: Diagnosis not present

## 2022-02-03 DIAGNOSIS — W19XXXD Unspecified fall, subsequent encounter: Secondary | ICD-10-CM

## 2022-02-03 NOTE — Patient Instructions (Signed)
  Plan: Avoid bending, stooping and avoid lifting weights greater than 10 lbs. Avoid prolong standing and walking. Avoid frequent bending and stooping  No lifting greater than 10 lbs. May use ice or moist heat for pain. Weight loss is of benefit. Handicap license is approved. Avoid overhead lifting and overhead use of the arms. Do not lift greater than 5 lbs. Adjust head rest in vehicle to prevent hyperextension if rear ended. Take extra precautions to avoid falling.

## 2022-02-03 NOTE — Progress Notes (Signed)
Office Visit Note   Patient: Karen Allison           Date of Birth: 1946/07/31           MRN: [id number] Visit Date: 02/03/2022              Requested by: [name], MD [address] [place],  [place] [id number] PCP: [name], MD   Assessment & Plan: Visit Diagnoses:  1. Post-traumatic osteoarthritis of left shoulder   2. Fall, subsequent encounter   3. Acute pain of right knee   4. Balance problem   5. Rib pain on left side   6. Fusion of lumbar spine     Plan: Avoid bending, stooping and avoid lifting weights greater than 10 lbs. Avoid prolong standing and walking. Avoid frequent bending and stooping  No lifting greater than 10 lbs. May use ice or moist heat for pain. Weight loss is of benefit. Handicap license is approved. Avoid overhead lifting and overhead use of the arms. Do not lift greater than 5 lbs. Adjust head rest in vehicle to prevent hyperextension if rear ended. Take extra precautions to avoid falling.  Follow-Up Instructions: Return in about 3 months (around 05/06/2022).   Orders:  No orders of the defined types were placed in this encounter.  No orders of the defined types were placed in this encounter.     Procedures: No procedures performed   Clinical Data: No additional findings.   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 She is able to walk with a walker and a seat. She has had osteoporosis and left humeral head and neck fracture and multiple rib fx and scapula fracture. She also had a cervical fusion for cervical stenosis and HNP. She is relatively  stable but off balance and falls easily with backward walking/stepping and with turning.    Review of Systems  Constitutional: Negative.   HENT: Negative.    Eyes: Negative.   Respiratory: Negative.    Cardiovascular: Negative.   Gastrointestinal: Negative.   Endocrine: Negative.   Genitourinary: Negative.   Musculoskeletal: Negative.   Skin: Negative.   Allergic/Immunologic: Negative.   Neurological: Negative.   Hematological: Negative.   Psychiatric/Behavioral: Negative.       Objective: Vital Signs: BP (!) 176/68   Pulse (!) 55   Ht 5\' 1"  (1.549 m)   Wt 138 lb 1.3 oz (62.6 kg)   BMI 26.09 kg/m   Physical Exam Musculoskeletal:     Lumbar back: Negative right straight leg raise test.    Back Exam   Tenderness  The patient is experiencing tenderness in the cervical and lumbar.  Range of Motion  Extension:  normal  Flexion:  normal  Lateral bend right:  normal  Lateral bend left:  normal  Rotation right:  normal  Rotation left:  normal   Muscle Strength  Right Quadriceps:  5/5  Left Quadriceps:  5/5  Right Hamstrings:  5/5  Left Hamstrings:  5/5   Tests  Straight leg raise right: negative  Reflexes  Achilles:  0/4 Biceps:  0/4  Other  Toe walk: normal  Heel walk: normal Sensation: normal Gait: normal  Erythema: no back redness Scars: absent     Specialty Comments:  No specialty comments available.  Imaging: No results found.   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[facility]) 06/05/2020   Malignant neoplasm of left breast ([facility])  06/05/2020   Gait disorder 07/05/2016   HCAP (healthcare-associated pneumonia) 05/09/2014   Acute respiratory failure with hypoxia ([facility]) 05/09/2014   Pneumonia 05/09/2014   Hypertension    Hypothyroidism    Cervical stenosis (uterine cervix) 05/07/2014   HNP (herniated nucleus pulposus), cervical 05/06/2014   Adult hypothyroidism 05/03/2013   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   Past Medical History:  Diagnosis Date   Arthritis    Blood dyscrasia    bleeds and bruises easily   Breast cancer ([facility])    left   Gait disorder 07/05/2016   Hepatitis     Hep A years ago   Hypertension    Hypothyroidism    Graves disease, age 31 yo   Nose trouble    right nostril will hemorrhage at times   Pneumonia    Rosacea    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[facility])     Family History  Problem Relation Age of Onset   Congestive Heart Failure Mother    Heart disease Father    Tongue cancer Grandchild     Past Surgical History:  Procedure Laterality Date   ABDOMINAL HYSTERECTOMY     Age 40   ANTERIOR CERVICAL DECOMP/DISCECTOMY FUSION N/A 05/06/2014   Procedure: ANTERIOR CERVICAL DISCECTOMY FUSION C3-4 with transgraft, local bone graft, plate and screws;  Surgeon: Jessy Oto, MD;  Location: [facility];  Service: Orthopedics;  Laterality: N/A;   APPENDECTOMY     BREAST LUMPECTOMY Left    COLONOSCOPY     EYE SURGERY Bilateral    cataracts   LUMBAR SPINE SURGERY  2011   Removal of hematoma   ORIF HUMERUS FRACTURE Left 2013   THORACIC SPINE SURGERY  2011   Bone cages   THYROIDECTOMY     TONSILLECTOMY     Social History   Occupational History   Occupation: Retired  Tobacco Use   Smoking status: Former    Types: Cigarettes    Quit date: 04/23/1962    Years since quitting: 59.8   Smokeless tobacco: Never   Tobacco comments:    as teenager  Substance and Sexual Activity   Alcohol use: Yes    Comment: Social   Drug use: No   Sexual activity: Not on file

## 2022-02-11 ENCOUNTER — Inpatient Hospital Stay: Payer: Medicare Other

## 2022-02-11 VITALS — BP 152/61 | HR 60 | Temp 98.2°F | Resp 18 | Ht 61.0 in | Wt 136.0 lb

## 2022-02-11 DIAGNOSIS — C50912 Malignant neoplasm of unspecified site of left female breast: Secondary | ICD-10-CM | POA: Diagnosis not present

## 2022-02-11 DIAGNOSIS — F32A Depression, unspecified: Secondary | ICD-10-CM | POA: Diagnosis not present

## 2022-02-11 DIAGNOSIS — C787 Secondary malignant neoplasm of liver and intrahepatic bile duct: Secondary | ICD-10-CM | POA: Diagnosis not present

## 2022-02-11 DIAGNOSIS — M549 Dorsalgia, unspecified: Secondary | ICD-10-CM | POA: Diagnosis not present

## 2022-02-11 DIAGNOSIS — Z5112 Encounter for antineoplastic immunotherapy: Secondary | ICD-10-CM | POA: Diagnosis not present

## 2022-02-11 DIAGNOSIS — Z170 Estrogen receptor positive status [ER+]: Secondary | ICD-10-CM | POA: Diagnosis not present

## 2022-02-11 MED ORDER — SODIUM CHLORIDE 0.9 % IV SOLN: Freq: Once | INTRAVENOUS | Status: AC

## 2022-02-11 MED ORDER — TRASTUZUMAB-ANNS CHEMO 150 MG IV SOLR
6.0000 mg/kg | Freq: Once | INTRAVENOUS | Status: AC
  Administered 2022-02-11: 399 mg via INTRAVENOUS
  Filled 2022-02-11: qty 19

## 2022-02-24 ENCOUNTER — Other Ambulatory Visit: Payer: Self-pay | Admitting: Radiology

## 2022-03-03 MED FILL — Trastuzumab-anns For IV Soln 150 MG: INTRAVENOUS | Qty: 19 | Status: AC

## 2022-03-04 ENCOUNTER — Inpatient Hospital Stay: Payer: Medicare Other | Attending: Oncology

## 2022-03-04 VITALS — BP 149/68 | HR 53 | Temp 98.2°F | Resp 18 | Ht 61.0 in | Wt 142.5 lb

## 2022-03-04 DIAGNOSIS — M810 Age-related osteoporosis without current pathological fracture: Secondary | ICD-10-CM | POA: Diagnosis not present

## 2022-03-04 DIAGNOSIS — Z5112 Encounter for antineoplastic immunotherapy: Secondary | ICD-10-CM | POA: Insufficient documentation

## 2022-03-04 DIAGNOSIS — C787 Secondary malignant neoplasm of liver and intrahepatic bile duct: Secondary | ICD-10-CM | POA: Insufficient documentation

## 2022-03-04 DIAGNOSIS — Z79899 Other long term (current) drug therapy: Secondary | ICD-10-CM | POA: Insufficient documentation

## 2022-03-04 DIAGNOSIS — C7800 Secondary malignant neoplasm of unspecified lung: Secondary | ICD-10-CM | POA: Diagnosis not present

## 2022-03-04 DIAGNOSIS — C50912 Malignant neoplasm of unspecified site of left female breast: Secondary | ICD-10-CM | POA: Diagnosis not present

## 2022-03-04 MED ORDER — TRASTUZUMAB-ANNS CHEMO 150 MG IV SOLR
6.0000 mg/kg | Freq: Once | INTRAVENOUS | Status: AC
  Administered 2022-03-04: 399 mg via INTRAVENOUS
  Filled 2022-03-04: qty 19

## 2022-03-04 MED ORDER — SODIUM CHLORIDE 0.9 % IV SOLN: Freq: Once | INTRAVENOUS | Status: AC

## 2022-03-04 NOTE — Patient Instructions (Signed)
[facility]  Discharge Instructions: Thank you for choosing [facility] to provide your oncology and hematology care.  If you have a lab appointment with the [facility], please go directly to the [facility] and check in at the registration area.   Wear comfortable clothing and clothing appropriate for easy access to any Portacath or PICC line.   We strive to give you quality time with your provider. You may need to reschedule your appointment if you arrive late (15 or more minutes).  Arriving late affects you and other patients whose appointments are after yours.  Also, if you miss three or more appointments without notifying the office, you may be dismissed from the clinic at the provider's discretion.      For prescription refill requests, have your pharmacy contact our office and allow 72 hours for refills to be completed.    Today you received the following chemotherapy and/or immunotherapy agents TRASTUZUMABTrastuzumab; Hyaluronidase injection What is this medication? TRASTUZUMAB; HYALURONIDASE (tras TOO zoo mab / hye al ur ON i dase) is used to treat breast cancer and stomach cancer. Trastuzumab is a monoclonal antibody. Hyaluronidase is used to improve the effects of trastuzumab. This medicine may be used for other purposes; ask your health care provider or pharmacist if you have questions. COMMON BRAND NAME(S): HERCEPTIN HYLECTA What should I tell my care team before I take this medication? They need to know if you have any of these conditions: heart disease heart failure lung or breathing disease, like asthma an unusual or allergic reaction to trastuzumab, or other medications, foods, dyes, or preservatives pregnant or trying to get pregnant breast-feeding How should I use this medication? This medicine is for injection under the skin. It is given by a health care professional in a hospital or clinic setting. Talk to your pediatrician  regarding the use of this medicine in children. This medicine is not approved for use in children. Overdosage: If you think you have taken too much of this medicine contact a poison control center or emergency room at once. NOTE: This medicine is only for you. Do not share this medicine with others. What if I miss a dose? It is important not to miss a dose. Call your doctor or health care professional if you are unable to keep an appointment. What may interact with this medication? This medicine may interact with the following medications: certain types of chemotherapy, such as daunorubicin, doxorubicin, epirubicin, and idarubicin This list may not describe all possible interactions. Give your health care provider a list of all the medicines, herbs, non-prescription drugs, or dietary supplements you use. Also tell them if you smoke, drink alcohol, or use illegal drugs. Some items may interact with your medicine. What should I watch for while using this medication? Visit your doctor for checks on your progress. Report any side effects. Continue your course of treatment even though you feel ill unless your doctor tells you to stop. Call your doctor or health care professional for advice if you get a fever, chills or sore throat, or other symptoms of a cold or flu. Do not treat yourself. Try to avoid being around people who are sick. You may experience fever, chills and shaking during your first infusion. These effects are usually mild and can be treated with other medicines. Report any side effects during the infusion to your health care professional. Fever and chills usually do not happen with later infusions. Do not become pregnant while  taking this medicine or for 7 months after stopping it. Women should inform their doctor if they wish to become pregnant or think they might be pregnant. Women of child-bearing potential will need to have a negative pregnancy test before starting this medicine. There is  a potential for serious side effects to an unborn child. Talk to your health care professional or pharmacist for more information. Do not breast-feed an infant while taking this medicine or for 7 months after stopping it. What side effects may I notice from receiving this medication? Side effects that you should report to your doctor or health care professional as soon as possible: allergic reactions like skin rash, itching or hives, swelling of the face, lips, or tongue breathing problems chest pain or palpitations cough fever general ill feeling or flu-like symptoms signs of worsening heart failure like breathing problems; swelling in your legs and feet Side effects that usually do not require medical attention (report these to your doctor or health care professional if they continue or are bothersome): bone pain changes in taste diarrhea joint pain nausea/vomiting unusually weak or tired weight loss This list may not describe all possible side effects. Call your doctor for medical advice about side effects. You may report side effects to FDA at 1-800-FDA-1088. Where should I keep my medication? This drug is given in a hospital or clinic and will not be stored at home. NOTE: This sheet is a summary. It may not cover all possible information. If you have questions about this medicine, talk to your doctor, pharmacist, or health care provider.  2023 Elsevier/Gold Standard (2017-12-25 00:00:00)       To help prevent nausea and vomiting after your treatment, we encourage you to take your nausea medication as directed.  BELOW ARE SYMPTOMS THAT SHOULD BE REPORTED IMMEDIATELY: *FEVER GREATER THAN 100.4 F (38 C) OR HIGHER *CHILLS OR SWEATING *NAUSEA AND VOMITING THAT IS NOT CONTROLLED WITH YOUR NAUSEA MEDICATION *UNUSUAL SHORTNESS OF BREATH *UNUSUAL BRUISING OR BLEEDING *URINARY PROBLEMS (pain or burning when urinating, or frequent urination) *BOWEL PROBLEMS (unusual diarrhea,  constipation, pain near the anus) TENDERNESS IN MOUTH AND THROAT WITH OR WITHOUT PRESENCE OF ULCERS (sore throat, sores in mouth, or a toothache) UNUSUAL RASH, SWELLING OR PAIN  UNUSUAL VAGINAL DISCHARGE OR ITCHING   Items with * indicate a potential emergency and should be followed up as soon as possible or go to the Emergency Department if any problems should occur.  Please show the CHEMOTHERAPY ALERT CARD or IMMUNOTHERAPY ALERT CARD at check-in to the Emergency Department and triage nurse.  Should you have questions after your visit or need to cancel or reschedule your appointment, please contact [facility]  Dept: [phone]  and follow the prompts.  Office hours are 8:00 a.m. to 4:30 p.m. Monday - Friday. Please note that voicemails left after 4:00 p.m. may not be returned until the following business day.  We are closed weekends and major holidays. You have access to a nurse at all times for urgent questions. Please call the main number to the clinic Dept: [phone] and follow the prompts.  For any non-urgent questions, you may also contact your provider using MyChart. We now offer e-Visits for anyone 63 and older to request care online for non-urgent symptoms. For details visit [email or web address].   Also download the MyChart app! Go to the app store, search "MyChart", open the app, select [facility], and log in with your MyChart username and password.  Masks are  optional in the cancer centers. If you would like for your care team to wear a mask while they are taking care of you, please let them know. For doctor visits, patients may have with them one support person who is at least 78 years old. At this time, visitors are not allowed in the infusion area.

## 2022-03-07 ENCOUNTER — Encounter: Payer: Self-pay | Admitting: Specialist

## 2022-03-07 DIAGNOSIS — E039 Hypothyroidism, unspecified: Secondary | ICD-10-CM | POA: Diagnosis not present

## 2022-03-07 DIAGNOSIS — I10 Essential (primary) hypertension: Secondary | ICD-10-CM | POA: Diagnosis not present

## 2022-03-07 DIAGNOSIS — M5137 Other intervertebral disc degeneration, lumbosacral region: Secondary | ICD-10-CM | POA: Diagnosis not present

## 2022-03-07 DIAGNOSIS — K219 Gastro-esophageal reflux disease without esophagitis: Secondary | ICD-10-CM | POA: Diagnosis not present

## 2022-03-07 DIAGNOSIS — Z6825 Body mass index (BMI) 25.0-25.9, adult: Secondary | ICD-10-CM | POA: Diagnosis not present

## 2022-03-09 DIAGNOSIS — C7800 Secondary malignant neoplasm of unspecified lung: Secondary | ICD-10-CM | POA: Diagnosis not present

## 2022-03-09 DIAGNOSIS — Z9189 Other specified personal risk factors, not elsewhere classified: Secondary | ICD-10-CM | POA: Diagnosis not present

## 2022-03-09 DIAGNOSIS — R918 Other nonspecific abnormal finding of lung field: Secondary | ICD-10-CM | POA: Diagnosis not present

## 2022-03-09 DIAGNOSIS — Z90721 Acquired absence of ovaries, unilateral: Secondary | ICD-10-CM | POA: Diagnosis not present

## 2022-03-09 DIAGNOSIS — K7689 Other specified diseases of liver: Secondary | ICD-10-CM | POA: Diagnosis not present

## 2022-03-09 DIAGNOSIS — R5383 Other fatigue: Secondary | ICD-10-CM | POA: Diagnosis not present

## 2022-03-09 DIAGNOSIS — C50912 Malignant neoplasm of unspecified site of left female breast: Secondary | ICD-10-CM | POA: Diagnosis not present

## 2022-03-09 DIAGNOSIS — Z9049 Acquired absence of other specified parts of digestive tract: Secondary | ICD-10-CM | POA: Diagnosis not present

## 2022-03-09 DIAGNOSIS — Z79899 Other long term (current) drug therapy: Secondary | ICD-10-CM | POA: Diagnosis not present

## 2022-03-09 DIAGNOSIS — Z87891 Personal history of nicotine dependence: Secondary | ICD-10-CM | POA: Diagnosis not present

## 2022-03-09 DIAGNOSIS — C787 Secondary malignant neoplasm of liver and intrahepatic bile duct: Secondary | ICD-10-CM | POA: Diagnosis not present

## 2022-03-09 DIAGNOSIS — Z170 Estrogen receptor positive status [ER+]: Secondary | ICD-10-CM | POA: Diagnosis not present

## 2022-03-14 ENCOUNTER — Other Ambulatory Visit: Payer: Self-pay

## 2022-03-24 MED FILL — Trastuzumab-anns For IV Soln 150 MG: INTRAVENOUS | Qty: 19 | Status: AC

## 2022-03-25 ENCOUNTER — Telehealth: Payer: Self-pay | Admitting: Oncology

## 2022-03-25 ENCOUNTER — Inpatient Hospital Stay: Payer: Medicare Other | Attending: Oncology

## 2022-03-25 VITALS — BP 166/58 | HR 57 | Temp 97.7°F | Resp 18 | Ht 61.0 in | Wt 142.5 lb

## 2022-03-25 DIAGNOSIS — Z79899 Other long term (current) drug therapy: Secondary | ICD-10-CM | POA: Diagnosis not present

## 2022-03-25 DIAGNOSIS — C50912 Malignant neoplasm of unspecified site of left female breast: Secondary | ICD-10-CM | POA: Diagnosis not present

## 2022-03-25 DIAGNOSIS — Z5112 Encounter for antineoplastic immunotherapy: Secondary | ICD-10-CM | POA: Diagnosis not present

## 2022-03-25 DIAGNOSIS — C787 Secondary malignant neoplasm of liver and intrahepatic bile duct: Secondary | ICD-10-CM | POA: Insufficient documentation

## 2022-03-25 MED ORDER — TRASTUZUMAB-ANNS CHEMO 150 MG IV SOLR
6.0000 mg/kg | Freq: Once | INTRAVENOUS | Status: AC
  Administered 2022-03-25: 399 mg via INTRAVENOUS
  Filled 2022-03-25: qty 19

## 2022-03-25 MED ORDER — SODIUM CHLORIDE 0.9 % IV SOLN: Freq: Once | INTRAVENOUS | Status: AC

## 2022-03-25 NOTE — Patient Instructions (Addendum)
[facility]  Discharge Instructions: Thank you for choosing [facility] to provide your oncology and hematology care.  If you have a lab appointment with the [facility], please go directly to the [facility] and check in at the registration area.   Wear comfortable clothing and clothing appropriate for easy access to any Portacath or PICC line.   We strive to give you quality time with your provider. You may need to reschedule your appointment if you arrive late (15 or more minutes).  Arriving late affects you and other patients whose appointments are after yours.  Also, if you miss three or more appointments without notifying the office, you may be dismissed from the clinic at the provider's discretion.      For prescription refill requests, have your pharmacy contact our office and allow 72 hours for refills to be completed.    Today you received the following chemotherapy and/or immunotherapy agents trastuzumab      To help prevent nausea and vomiting after your treatment, we encourage you to take your nausea medication as directed.  BELOW ARE SYMPTOMS THAT SHOULD BE REPORTED IMMEDIATELY: *FEVER GREATER THAN 100.4 F (38 C) OR HIGHER *CHILLS OR SWEATING *NAUSEA AND VOMITING THAT IS NOT CONTROLLED WITH YOUR NAUSEA MEDICATION *UNUSUAL SHORTNESS OF BREATH *UNUSUAL BRUISING OR BLEEDING *URINARY PROBLEMS (pain or burning when urinating, or frequent urination) *BOWEL PROBLEMS (unusual diarrhea, constipation, pain near the anus) TENDERNESS IN MOUTH AND THROAT WITH OR WITHOUT PRESENCE OF ULCERS (sore throat, sores in mouth, or a toothache) UNUSUAL RASH, SWELLING OR PAIN  UNUSUAL VAGINAL DISCHARGE OR ITCHING   Items with * indicate a potential emergency and should be followed up as soon as possible or go to the Emergency Department if any problems should occur.  Please show the CHEMOTHERAPY ALERT CARD or IMMUNOTHERAPY ALERT CARD at check-in to the  Emergency Department and triage nurse.  Should you have questions after your visit or need to cancel or reschedule your appointment, please contact [facility]  Dept: [phone]  and follow the prompts.  Office hours are 8:00 a.m. to 4:30 p.m. Monday - Friday. Please note that voicemails left after 4:00 p.m. may not be returned until the following business day.  We are closed weekends and major holidays. You have access to a nurse at all times for urgent questions. Please call the main number to the clinic Dept: [phone] and follow the prompts.  For any non-urgent questions, you may also contact your provider using MyChart. We now offer e-Visits for anyone 23 and older to request care online for non-urgent symptoms. For details visit [email or web address].   Also download the MyChart app! Go to the app store, search "MyChart", open the app, select [facility], and log in with your MyChart username and password.  Masks are optional in the cancer centers. If you would like for your care team to wear a mask while they are taking care of you, please let them know. You may have one support person who is at least 78 years old accompany you for your appointments. Trastuzumab; Hyaluronidase Injection What is this medication? TRASTUZUMAB; HYALURONIDASE (tras TOO zoo mab; hye al ur ON i dase) treats breast cancer. Trastuzumab works by blocking a protein that causes cancer cells to grow and multiply. This helps to slow or stop the spread of cancer cells. Hyaluronidase works by increasing the absorption of other medications in the body to help them work better.  It is a combination medication that contains a monoclonal antibody. This medicine may be used for other purposes; ask your health care provider or pharmacist if you have questions. COMMON BRAND NAME(S): HERCEPTIN HYLECTA What should I tell my care team before I take this medication? They need to know if you have any of  these conditions: Heart failure Lung disease An unusual or allergic reaction to trastuzumab, or other medications, foods, dyes, or preservatives Pregnant or trying to get pregnant Breast-feeding How should I use this medication? This medication is injected under the skin. It is given by your care team in a hospital or clinic setting. Talk to your care team about the use of this medication in children. It is not approved for use in children. Overdosage: If you think you have taken too much of this medicine contact a poison control center or emergency room at once. NOTE: This medicine is only for you. Do not share this medicine with others. What if I miss a dose? Keep appointments for follow-up doses. It is important not to miss your dose. Call your care team if you are unable to keep an appointment. What may interact with this medication? Certain types of chemotherapy, such as daunorubicin, doxorubicin, epirubicin, idarubicin This list may not describe all possible interactions. Give your health care provider a list of all the medicines, herbs, non-prescription drugs, or dietary supplements you use. Also tell them if you smoke, drink alcohol, or use illegal drugs. Some items may interact with your medicine. What should I watch for while using this medication? Your condition will be monitored carefully while you are receiving this medication. This medication may make you feel generally unwell. This is not uncommon as chemotherapy can affect healthy cells as well as cancer cells. Report any side effects. Continue your course of treatment even though you feel ill unless your care team tells you to stop. This medication may increase your risk of getting an infection. Call your care team for advice if you get a fever, chills, sore throat, or other symptoms of a cold or flu. Do not treat yourself. Try to avoid being around people who are sick. Avoid taking medications that contain aspirin,  acetaminophen, ibuprofen, naproxen, or ketoprofen unless instructed by your care team. These medications may hide a fever. Talk to your care team if you may be pregnant. Serious birth defects can occur if you take this medication during pregnancy and for 7 months after the last dose. You will need a negative pregnancy test before starting this medication. Contraception is recommended while taking this medication and for 7 months after the last dose. Your care team can help you find the option that works for you. Do not breastfeed while taking this medication or for 7 months after the last dose. What side effects may I notice from receiving this medication? Side effects that you should report to your care team as soon as possible: Allergic reactions or angioedema--skin rash, itching or hives, swelling of the face, eyes, lips, tongue, arms, or legs, trouble swallowing or breathing Dry cough, shortness of breath or trouble breathing Heart failure--shortness of breath, swelling of the ankles, feet, or hands, sudden weight gain, unusual weakness or fatigue Heart rhythm changes--fast or irregular heartbeat, dizziness, feeling faint or lightheaded, chest pain, trouble breathing Increase in blood pressure Infection--fever, chills, cough, or sore throat Side effects that usually do not require medical attention (report these to your care team if they continue or are bothersome): Diarrhea Hair loss Headache  Muscle pain Nausea Unusual weakness or fatigue This list may not describe all possible side effects. Call your doctor for medical advice about side effects. You may report side effects to FDA at 1-800-FDA-1088. Where should I keep my medication? This medication is given in a hospital or clinic. It will not be stored at home. NOTE: This sheet is a summary. It may not cover all possible information. If you have questions about this medicine, talk to your doctor, pharmacist, or health care provider.   2023 Elsevier/Gold Standard (2021-12-09 00:00:00)

## 2022-03-25 NOTE — Telephone Encounter (Signed)
03/25/22 lft msg about appt cancelled with Dr Hinton Rao in Sept.

## 2022-04-10 ENCOUNTER — Other Ambulatory Visit: Payer: Self-pay | Admitting: Oncology

## 2022-04-14 MED FILL — Trastuzumab-anns For IV Soln 150 MG: INTRAVENOUS | Qty: 19 | Status: AC

## 2022-04-15 ENCOUNTER — Telehealth: Payer: Self-pay

## 2022-04-15 ENCOUNTER — Ambulatory Visit: Payer: Medicare Other

## 2022-04-15 NOTE — Telephone Encounter (Signed)
Pt LVM on nurse line that she needed to cancel her chemo appt for today. They found out yesterday her daughter tested positive for TB. Pt will call [facility] for recommendations on where to get tested,etc, as her PCP is not in office.

## 2022-04-18 ENCOUNTER — Encounter: Payer: Self-pay | Admitting: Oncology

## 2022-04-18 DIAGNOSIS — Z111 Encounter for screening for respiratory tuberculosis: Secondary | ICD-10-CM | POA: Diagnosis not present

## 2022-04-21 ENCOUNTER — Telehealth: Payer: Self-pay

## 2022-04-21 NOTE — Telephone Encounter (Signed)
Pt called to report that her TB testing was negative. She is having her PCP to fax [place] the results. Message sent to Dr Hinton Rao, Vida Roller M,PA, and [place], [facility]. I also sent scheduling a message.

## 2022-04-23 ENCOUNTER — Other Ambulatory Visit: Payer: Self-pay | Admitting: Pharmacist

## 2022-04-23 DIAGNOSIS — C50912 Malignant neoplasm of unspecified site of left female breast: Secondary | ICD-10-CM

## 2022-04-29 ENCOUNTER — Inpatient Hospital Stay: Payer: Medicare Other | Attending: Oncology

## 2022-04-29 ENCOUNTER — Encounter: Payer: Self-pay | Admitting: Oncology

## 2022-04-29 VITALS — BP 168/61 | HR 75 | Temp 98.3°F | Resp 18 | Ht 61.0 in | Wt 141.0 lb

## 2022-04-29 DIAGNOSIS — C787 Secondary malignant neoplasm of liver and intrahepatic bile duct: Secondary | ICD-10-CM | POA: Diagnosis not present

## 2022-04-29 DIAGNOSIS — Z5112 Encounter for antineoplastic immunotherapy: Secondary | ICD-10-CM | POA: Diagnosis not present

## 2022-04-29 DIAGNOSIS — Z170 Estrogen receptor positive status [ER+]: Secondary | ICD-10-CM | POA: Diagnosis not present

## 2022-04-29 DIAGNOSIS — C50912 Malignant neoplasm of unspecified site of left female breast: Secondary | ICD-10-CM

## 2022-04-29 MED ORDER — TRASTUZUMAB-ANNS CHEMO 150 MG IV SOLR
6.0000 mg/kg | Freq: Once | INTRAVENOUS | Status: AC
  Administered 2022-04-29: 399 mg via INTRAVENOUS
  Filled 2022-04-29: qty 19

## 2022-04-29 MED ORDER — SODIUM CHLORIDE 0.9 % IV SOLN: Freq: Once | INTRAVENOUS | Status: AC

## 2022-04-29 NOTE — Patient Instructions (Signed)
Trastuzumab Injection What is this medication? TRASTUZUMAB (tras TOO zoo mab) treats breast cancer and stomach cancer. It works by blocking a protein that causes cancer cells to grow and multiply. This helps to slow or stop the spread of cancer cells. This medicine may be used for other purposes; ask your health care provider or pharmacist if you have questions. COMMON BRAND NAME(S): Herceptin, Janae Bridgeman, Ontruzant, Trazimera What should I tell my care team before I take this medication? They need to know if you have any of these conditions: Heart failure Lung disease An unusual or allergic reaction to trastuzumab, other medications, foods, dyes, or preservatives Pregnant or trying to get pregnant Breast-feeding How should I use this medication? This medication is injected into a vein. It is given by your care team in a hospital or clinic setting. Talk to your care team about the use of this medication in children. It is not approved for use in children. Overdosage: If you think you have taken too much of this medicine contact a poison control center or emergency room at once. NOTE: This medicine is only for you. Do not share this medicine with others. What if I miss a dose? Keep appointments for follow-up doses. It is important not to miss your dose. Call your care team if you are unable to keep an appointment. What may interact with this medication? Certain types of chemotherapy, such as daunorubicin, doxorubicin, epirubicin, idarubicin This list may not describe all possible interactions. Give your health care provider a list of all the medicines, herbs, non-prescription drugs, or dietary supplements you use. Also tell them if you smoke, drink alcohol, or use illegal drugs. Some items may interact with your medicine. What should I watch for while using this medication? Your condition will be monitored carefully while you are receiving this medication. This medication may  make you feel generally unwell. This is not uncommon, as chemotherapy affects healthy cells as well as cancer cells. Report any side effects. Continue your course of treatment even though you feel ill unless your care team tells you to stop. This medication may increase your risk of getting an infection. Call your care team for advice if you get a fever, chills, sore throat, or other symptoms of a cold or flu. Do not treat yourself. Try to avoid being around people who are sick. Avoid taking medications that contain aspirin, acetaminophen, ibuprofen, naproxen, or ketoprofen unless instructed by your care team. These medications can hide a fever. Talk to your care team if you may be pregnant. Serious birth defects can occur if you take this medication during pregnancy and for 7 months after the last dose. You will need a negative pregnancy test before starting this medication. Contraception is recommended while taking this medication and for 7 months after the last dose. Your care team can help you find the option that works for you. Do not breastfeed while taking this medication and for 7 months after stopping treatment. What side effects may I notice from receiving this medication? Side effects that you should report to your care team as soon as possible: Allergic reactions or angioedema--skin rash, itching or hives, swelling of the face, eyes, lips, tongue, arms, or legs, trouble swallowing or breathing Dry cough, shortness of breath or trouble breathing Heart failure--shortness of breath, swelling of the ankles, feet, or hands, sudden weight gain, unusual weakness or fatigue Infection--fever, chills, cough, or sore throat Infusion reactions--chest pain, shortness of breath or trouble breathing, feeling faint or  lightheaded Side effects that usually do not require medical attention (report to your care team if they continue or are bothersome): Diarrhea Dizziness Headache Nausea Trouble  sleeping Vomiting This list may not describe all possible side effects. Call your doctor for medical advice about side effects. You may report side effects to FDA at 1-800-FDA-1088. Where should I keep my medication? This medication is given in a hospital or clinic. It will not be stored at home. NOTE: This sheet is a summary. It may not cover all possible information. If you have questions about this medicine, talk to your doctor, pharmacist, or health care provider.  2023 Elsevier/Gold Standard (2021-12-21 00:00:00)

## 2022-05-05 ENCOUNTER — Ambulatory Visit (INDEPENDENT_AMBULATORY_CARE_PROVIDER_SITE_OTHER): Payer: Medicare Other | Admitting: Specialist

## 2022-05-05 ENCOUNTER — Encounter: Payer: Self-pay | Admitting: Specialist

## 2022-05-05 VITALS — BP 194/78 | HR 62 | Ht 60.0 in | Wt 143.5 lb

## 2022-05-05 DIAGNOSIS — R2689 Other abnormalities of gait and mobility: Secondary | ICD-10-CM

## 2022-05-05 DIAGNOSIS — M48062 Spinal stenosis, lumbar region with neurogenic claudication: Secondary | ICD-10-CM

## 2022-05-05 DIAGNOSIS — M7522 Bicipital tendinitis, left shoulder: Secondary | ICD-10-CM

## 2022-05-05 DIAGNOSIS — M25561 Pain in right knee: Secondary | ICD-10-CM

## 2022-05-05 DIAGNOSIS — W19XXXD Unspecified fall, subsequent encounter: Secondary | ICD-10-CM

## 2022-05-05 DIAGNOSIS — G8929 Other chronic pain: Secondary | ICD-10-CM

## 2022-05-05 DIAGNOSIS — M19112 Post-traumatic osteoarthritis, left shoulder: Secondary | ICD-10-CM | POA: Diagnosis not present

## 2022-05-05 DIAGNOSIS — S42111A Displaced fracture of body of scapula, right shoulder, initial encounter for closed fracture: Secondary | ICD-10-CM

## 2022-05-05 DIAGNOSIS — S83411A Sprain of medial collateral ligament of right knee, initial encounter: Secondary | ICD-10-CM | POA: Diagnosis not present

## 2022-05-05 DIAGNOSIS — M4322 Fusion of spine, cervical region: Secondary | ICD-10-CM | POA: Diagnosis not present

## 2022-05-05 DIAGNOSIS — M79631 Pain in right forearm: Secondary | ICD-10-CM | POA: Diagnosis not present

## 2022-05-05 DIAGNOSIS — M4326 Fusion of spine, lumbar region: Secondary | ICD-10-CM | POA: Diagnosis not present

## 2022-05-05 DIAGNOSIS — S46911D Strain of unspecified muscle, fascia and tendon at shoulder and upper arm level, right arm, subsequent encounter: Secondary | ICD-10-CM | POA: Diagnosis not present

## 2022-05-05 DIAGNOSIS — R0781 Pleurodynia: Secondary | ICD-10-CM

## 2022-05-05 NOTE — Patient Instructions (Signed)
Plan: Avoid bending, stooping and avoid lifting weights greater than 10 lbs. Avoid prolong standing and walking. Avoid frequent bending and stooping  No lifting greater than 10 lbs. May use ice or moist heat for pain. Weight loss is of benefit. Handicap license is approved. Avoid overhead lifting and overhead use of the arms. Do not lift greater than 5 lbs. Adjust head rest in vehicle to prevent hyperextension if rear ended. Take extra precautions to avoid falling.  Do to severe shoulder arthritis I would recommend that you consider being followed by our shoulder specialist Dr. Marlou Sa. You may need occasional visit with a spine specialist and that can be arranged if you have sign of worsening. Follow-Up Instructions: Return in about 3 months (around

## 2022-05-05 NOTE — Progress Notes (Signed)
Office Visit Note   Patient: Karen Allison           Date of Birth: [birth year]           MRN: [id number] Visit Date: 05/05/2022              Requested by: [name], MD [address] [place],  [place] [id number] PCP: [name], MD   Assessment & Plan: Visit Diagnoses:  1. Post-traumatic osteoarthritis of left shoulder   2. Fall, subsequent encounter   3. Acute pain of right knee   4. Fusion of lumbar spine   5. Rib pain on left side   6. Balance problem   7. Sprain of medial collateral ligament of right knee, initial encounter   8. Cervical vertebral fusion   9. Strain of right shoulder, subsequent encounter   10. Bicipital tendonitis of shoulder, left   11. Pain in right forearm   12. Chronic pain of right knee   13. Rib pain on right side   14. Spinal stenosis of lumbar region with neurogenic claudication   15. Closed displaced fracture of body of right scapula, initial encounter     Plan: Plan: Avoid bending, stooping and avoid lifting weights greater than 10 lbs. Avoid prolong standing and walking. Avoid frequent bending and stooping  No lifting greater than 10 lbs. May use ice or moist heat for pain. Weight loss is of benefit. Handicap license is approved. Avoid overhead lifting and overhead use of the arms. Do not lift greater than 5 lbs. Adjust head rest in vehicle to prevent hyperextension if rear ended. Take extra precautions to avoid falling.  Do to severe shoulder arthritis I would recommend that you consider being followed by our shoulder specialist. Dr. Marlou Sa. You may need occasional visit with a spine specialist and that can be arranged if you have sign of worsening. Follow-Up Instructions: Return in about 3 months (around   Follow-Up Instructions: No follow-ups on file.   Orders:  No orders of the defined types were placed in this encounter.  No orders of the defined types were placed in this encounter.     Procedures: No  procedures performed   Clinical Data: No additional findings.   Subjective: Chief Complaint  Patient presents with   Lower Back - Follow-up, Pain   Left Shoulder - Follow-up    76 year old female with history of lumbar fusion for spondylosis and degenerative disc disease post central laminectomy by my self in the years prior to the fusion done at [facility] complicated by an epidural hematoma with significant cauda equina. 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 The shoulder is painful but she does not want TSR. She is busy helping her husband with Plan to have a THR by Dr. Almyra Deforest at [facility].     Review of Systems  Constitutional: Negative.   HENT: Negative.    Eyes: Negative.   Respiratory: Negative.    Cardiovascular: Negative.   Gastrointestinal: Negative.   Endocrine: Negative.   Genitourinary: Negative.   Musculoskeletal: Negative.   Skin: Negative.   Allergic/Immunologic: Negative.   Neurological: Negative.   Hematological: Negative.   Psychiatric/Behavioral: Negative.       Objective: Vital Signs: BP (!) 194/78 (BP Location: Right Arm, Patient Position: Sitting)   Pulse 62   Ht 5' (1.524 m)   Wt 143 lb 8 oz (65.1 kg)   BMI 28.03 kg/m  Physical Exam Constitutional:      Appearance: She is well-developed.  HENT:     Head: Normocephalic and atraumatic.  Eyes:     Pupils: Pupils are equal, round, and reactive to light.  Pulmonary:     Effort: Pulmonary effort is normal.     Breath sounds: Normal breath sounds.  Abdominal:     General: Bowel sounds are normal.     Palpations: Abdomen is soft.  Musculoskeletal:     Cervical back: Normal range of motion and neck supple.     Lumbar back: Negative right straight leg raise test and negative left straight leg raise test.  Skin:    General: Skin is warm and dry.  Neurological:     Mental Status: She is alert and oriented to person, place,  and time.  Psychiatric:        Behavior: Behavior normal.        Thought Content: Thought content normal.        Judgment: Judgment normal.     Back Exam   Tenderness  The patient is experiencing tenderness in the cervical.  Range of Motion  Extension:  50 abnormal  Flexion:  40 abnormal  Lateral bend right:  normal  Lateral bend left:  normal  Rotation right:  normal  Rotation left:  normal   Muscle Strength  Right Quadriceps:  5/5  Left Quadriceps:  5/5  Right Hamstrings:  5/5  Left Hamstrings:  5/5   Tests  Straight leg raise right: negative Straight leg raise left: negative  Reflexes  Patellar:  0/4 Achilles:  0/4 Biceps:  0/4 Babinski's sign: normal   Other  Toe walk: normal Heel walk: normal Sensation: normal Gait: normal  Erythema: no back redness Scars: present      Specialty Comments:  No specialty comments available.  Imaging: No results found.   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   Past Medical History:  Diagnosis Date   Arthritis    Blood dyscrasia    bleeds and bruises easily    Breast cancer ([facility])    left   Gait disorder 07/05/2016   Hepatitis     Hep A years ago   Hypertension    Hypothyroidism    Graves disease, age 32 yo   Nose trouble    right nostril will hemorrhage at times   Pneumonia    Rosacea    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[facility])     Family History  Problem Relation Age of Onset   Congestive Heart Failure Mother    Heart disease Father    Tongue cancer Grandchild  Past Surgical History:  Procedure Laterality Date   ABDOMINAL HYSTERECTOMY     Age 46   ANTERIOR CERVICAL DECOMP/DISCECTOMY FUSION N/A 05/06/2014   Procedure: ANTERIOR CERVICAL DISCECTOMY FUSION C3-4 with transgraft, local bone graft, plate and screws;  Surgeon: Jessy Oto, MD;  Location: [facility];  Service: Orthopedics;  Laterality: N/A;   APPENDECTOMY     BREAST LUMPECTOMY Left    COLONOSCOPY     EYE SURGERY Bilateral    cataracts   LUMBAR SPINE SURGERY  2011   Removal of hematoma   ORIF HUMERUS FRACTURE Left 2013   THORACIC SPINE SURGERY  2011   Bone cages   THYROIDECTOMY     TONSILLECTOMY     Social History   Occupational History   Occupation: Retired  Tobacco Use   Smoking status: Former    Types: Cigarettes    Quit date: 04/23/1962    Years since quitting: 60.0   Smokeless tobacco: Never   Tobacco comments:    as teenager  Substance and Sexual Activity   Alcohol use: Yes    Comment: Social   Drug use: No   Sexual activity: Not on file

## 2022-05-06 ENCOUNTER — Other Ambulatory Visit: Payer: Self-pay

## 2022-05-06 ENCOUNTER — Ambulatory Visit: Payer: Medicare Other

## 2022-05-13 ENCOUNTER — Other Ambulatory Visit: Payer: Self-pay | Admitting: Oncology

## 2022-05-18 ENCOUNTER — Other Ambulatory Visit: Payer: Medicare Other

## 2022-05-18 ENCOUNTER — Ambulatory Visit: Payer: Medicare Other | Admitting: Oncology

## 2022-05-19 MED FILL — Trastuzumab-anns For IV Soln 150 MG: INTRAVENOUS | Qty: 19 | Status: AC

## 2022-05-20 ENCOUNTER — Inpatient Hospital Stay: Payer: Medicare Other

## 2022-05-20 VITALS — BP 130/55 | HR 60 | Temp 98.1°F | Resp 18 | Ht 60.0 in | Wt 137.2 lb

## 2022-05-20 DIAGNOSIS — C787 Secondary malignant neoplasm of liver and intrahepatic bile duct: Secondary | ICD-10-CM | POA: Diagnosis not present

## 2022-05-20 DIAGNOSIS — Z5112 Encounter for antineoplastic immunotherapy: Secondary | ICD-10-CM | POA: Diagnosis not present

## 2022-05-20 DIAGNOSIS — C50912 Malignant neoplasm of unspecified site of left female breast: Secondary | ICD-10-CM

## 2022-05-20 DIAGNOSIS — Z170 Estrogen receptor positive status [ER+]: Secondary | ICD-10-CM | POA: Diagnosis not present

## 2022-05-20 MED ORDER — SODIUM CHLORIDE 0.9 % IV SOLN: Freq: Once | INTRAVENOUS | Status: AC

## 2022-05-20 MED ORDER — TRASTUZUMAB-ANNS CHEMO 150 MG IV SOLR
6.0000 mg/kg | Freq: Once | INTRAVENOUS | Status: AC
  Administered 2022-05-20: 399 mg via INTRAVENOUS
  Filled 2022-05-20: qty 19

## 2022-05-20 NOTE — Patient Instructions (Signed)
Trastuzumab Injection What is this medication? TRASTUZUMAB (tras TOO zoo mab) treats breast cancer and stomach cancer. It works by blocking a protein that causes cancer cells to grow and multiply. This helps to slow or stop the spread of cancer cells. This medicine may be used for other purposes; ask your health care provider or pharmacist if you have questions. COMMON BRAND NAME(S): Herceptin, Janae Bridgeman, Ontruzant, Trazimera What should I tell my care team before I take this medication? They need to know if you have any of these conditions: Heart failure Lung disease An unusual or allergic reaction to trastuzumab, other medications, foods, dyes, or preservatives Pregnant or trying to get pregnant Breast-feeding How should I use this medication? This medication is injected into a vein. It is given by your care team in a hospital or clinic setting. Talk to your care team about the use of this medication in children. It is not approved for use in children. Overdosage: If you think you have taken too much of this medicine contact a poison control center or emergency room at once. NOTE: This medicine is only for you. Do not share this medicine with others. What if I miss a dose? Keep appointments for follow-up doses. It is important not to miss your dose. Call your care team if you are unable to keep an appointment. What may interact with this medication? Certain types of chemotherapy, such as daunorubicin, doxorubicin, epirubicin, idarubicin This list may not describe all possible interactions. Give your health care provider a list of all the medicines, herbs, non-prescription drugs, or dietary supplements you use. Also tell them if you smoke, drink alcohol, or use illegal drugs. Some items may interact with your medicine. What should I watch for while using this medication? Your condition will be monitored carefully while you are receiving this medication. This medication may  make you feel generally unwell. This is not uncommon, as chemotherapy affects healthy cells as well as cancer cells. Report any side effects. Continue your course of treatment even though you feel ill unless your care team tells you to stop. This medication may increase your risk of getting an infection. Call your care team for advice if you get a fever, chills, sore throat, or other symptoms of a cold or flu. Do not treat yourself. Try to avoid being around people who are sick. Avoid taking medications that contain aspirin, acetaminophen, ibuprofen, naproxen, or ketoprofen unless instructed by your care team. These medications can hide a fever. Talk to your care team if you may be pregnant. Serious birth defects can occur if you take this medication during pregnancy and for 7 months after the last dose. You will need a negative pregnancy test before starting this medication. Contraception is recommended while taking this medication and for 7 months after the last dose. Your care team can help you find the option that works for you. Do not breastfeed while taking this medication and for 7 months after stopping treatment. What side effects may I notice from receiving this medication? Side effects that you should report to your care team as soon as possible: Allergic reactions or angioedema--skin rash, itching or hives, swelling of the face, eyes, lips, tongue, arms, or legs, trouble swallowing or breathing Dry cough, shortness of breath or trouble breathing Heart failure--shortness of breath, swelling of the ankles, feet, or hands, sudden weight gain, unusual weakness or fatigue Infection--fever, chills, cough, or sore throat Infusion reactions--chest pain, shortness of breath or trouble breathing, feeling faint or  lightheaded Side effects that usually do not require medical attention (report to your care team if they continue or are bothersome): Diarrhea Dizziness Headache Nausea Trouble  sleeping Vomiting This list may not describe all possible side effects. Call your doctor for medical advice about side effects. You may report side effects to FDA at 1-800-FDA-1088. Where should I keep my medication? This medication is given in a hospital or clinic. It will not be stored at home. NOTE: This sheet is a summary. It may not cover all possible information. If you have questions about this medicine, talk to your doctor, pharmacist, or health care provider.  2023 Elsevier/Gold Standard (2021-12-21 00:00:00)     [facility]  Discharge Instructions: Thank you for choosing [facility] to provide your oncology and hematology care.  If you have a lab appointment with the [facility], please go directly to the [facility] and check in at the registration area.   Wear comfortable clothing and clothing appropriate for easy access to any Portacath or PICC line.   We strive to give you quality time with your provider. You may need to reschedule your appointment if you arrive late (15 or more minutes).  Arriving late affects you and other patients whose appointments are after yours.  Also, if you miss three or more appointments without notifying the office, you may be dismissed from the clinic at the provider's discretion.      For prescription refill requests, have your pharmacy contact our office and allow 72 hours for refills to be completed.    Today you received the following chemotherapy and/or immunotherapy agents trastuzumab      To help prevent nausea and vomiting after your treatment, we encourage you to take your nausea medication as directed.  BELOW ARE SYMPTOMS THAT SHOULD BE REPORTED IMMEDIATELY: *FEVER GREATER THAN 100.4 F (38 C) OR HIGHER *CHILLS OR SWEATING *NAUSEA AND VOMITING THAT IS NOT CONTROLLED WITH YOUR NAUSEA MEDICATION *UNUSUAL SHORTNESS OF BREATH *UNUSUAL BRUISING OR BLEEDING *URINARY PROBLEMS (pain or burning  when urinating, or frequent urination) *BOWEL PROBLEMS (unusual diarrhea, constipation, pain near the anus) TENDERNESS IN MOUTH AND THROAT WITH OR WITHOUT PRESENCE OF ULCERS (sore throat, sores in mouth, or a toothache) UNUSUAL RASH, SWELLING OR PAIN  UNUSUAL VAGINAL DISCHARGE OR ITCHING   Items with * indicate a potential emergency and should be followed up as soon as possible or go to the Emergency Department if any problems should occur.  Please show the CHEMOTHERAPY ALERT CARD or IMMUNOTHERAPY ALERT CARD at check-in to the Emergency Department and triage nurse.  Should you have questions after your visit or need to cancel or reschedule your appointment, please contact [facility]  Dept: [phone]  and follow the prompts.  Office hours are 8:00 a.m. to 4:30 p.m. Monday - Friday. Please note that voicemails left after 4:00 p.m. may not be returned until the following business day.  We are closed weekends and major holidays. You have access to a nurse at all times for urgent questions. Please call the main number to the clinic Dept: [phone] and follow the prompts.  For any non-urgent questions, you may also contact your provider using MyChart. We now offer e-Visits for anyone 57 and older to request care online for non-urgent symptoms. For details visit [email or web address].   Also download the MyChart app! Go to the app store, search "MyChart", open the app, select [facility], and log in with your  MyChart username and password.  Masks are optional in the cancer centers. If you would like for your care team to wear a mask while they are taking care of you, please let them know. You may have one support person who is at least 78 years old accompany you for your appointments.

## 2022-06-09 DIAGNOSIS — Z23 Encounter for immunization: Secondary | ICD-10-CM | POA: Diagnosis not present

## 2022-06-09 MED FILL — Trastuzumab-anns For IV Soln 150 MG: INTRAVENOUS | Qty: 19 | Status: AC

## 2022-06-10 ENCOUNTER — Inpatient Hospital Stay: Payer: Medicare Other | Attending: Oncology

## 2022-06-10 VITALS — BP 120/50 | HR 50 | Temp 98.1°F | Resp 18 | Ht 60.0 in | Wt 139.0 lb

## 2022-06-10 DIAGNOSIS — C787 Secondary malignant neoplasm of liver and intrahepatic bile duct: Secondary | ICD-10-CM | POA: Diagnosis not present

## 2022-06-10 DIAGNOSIS — Z170 Estrogen receptor positive status [ER+]: Secondary | ICD-10-CM | POA: Diagnosis not present

## 2022-06-10 DIAGNOSIS — C50912 Malignant neoplasm of unspecified site of left female breast: Secondary | ICD-10-CM | POA: Insufficient documentation

## 2022-06-10 DIAGNOSIS — Z79899 Other long term (current) drug therapy: Secondary | ICD-10-CM | POA: Diagnosis not present

## 2022-06-10 DIAGNOSIS — Z5112 Encounter for antineoplastic immunotherapy: Secondary | ICD-10-CM | POA: Insufficient documentation

## 2022-06-10 MED ORDER — SODIUM CHLORIDE 0.9 % IV SOLN: Freq: Once | INTRAVENOUS | Status: AC

## 2022-06-10 MED ORDER — TRASTUZUMAB-ANNS CHEMO 150 MG IV SOLR
6.0000 mg/kg | Freq: Once | INTRAVENOUS | Status: AC
  Administered 2022-06-10: 399 mg via INTRAVENOUS
  Filled 2022-06-10: qty 19

## 2022-06-10 NOTE — Patient Instructions (Addendum)
Trastuzumab Injection What is this medication? TRASTUZUMAB (tras TOO zoo mab) treats breast cancer and stomach cancer. It works by blocking a protein that causes cancer cells to grow and multiply. This helps to slow or stop the spread of cancer cells. This medicine may be used for other purposes; ask your health care provider or pharmacist if you have questions. COMMON BRAND NAME(S): Herceptin, Janae Bridgeman, Ontruzant, Trazimera What should I tell my care team before I take this medication? They need to know if you have any of these conditions: Heart failure Lung disease An unusual or allergic reaction to trastuzumab, other medications, foods, dyes, or preservatives Pregnant or trying to get pregnant Breast-feeding How should I use this medication? This medication is injected into a vein. It is given by your care team in a hospital or clinic setting. Talk to your care team about the use of this medication in children. It is not approved for use in children. Overdosage: If you think you have taken too much of this medicine contact a poison control center or emergency room at once. NOTE: This medicine is only for you. Do not share this medicine with others. What if I miss a dose? Keep appointments for follow-up doses. It is important not to miss your dose. Call your care team if you are unable to keep an appointment. What may interact with this medication? Certain types of chemotherapy, such as daunorubicin, doxorubicin, epirubicin, idarubicin This list may not describe all possible interactions. Give your health care provider a list of all the medicines, herbs, non-prescription drugs, or dietary supplements you use. Also tell them if you smoke, drink alcohol, or use illegal drugs. Some items may interact with your medicine. What should I watch for while using this medication? Your condition will be monitored carefully while you are receiving this medication. This medication may  make you feel generally unwell. This is not uncommon, as chemotherapy affects healthy cells as well as cancer cells. Report any side effects. Continue your course of treatment even though you feel ill unless your care team tells you to stop. This medication may increase your risk of getting an infection. Call your care team for advice if you get a fever, chills, sore throat, or other symptoms of a cold or flu. Do not treat yourself. Try to avoid being around people who are sick. Avoid taking medications that contain aspirin, acetaminophen, ibuprofen, naproxen, or ketoprofen unless instructed by your care team. These medications can hide a fever. Talk to your care team if you may be pregnant. Serious birth defects can occur if you take this medication during pregnancy and for 7 months after the last dose. You will need a negative pregnancy test before starting this medication. Contraception is recommended while taking this medication and for 7 months after the last dose. Your care team can help you find the option that works for you. Do not breastfeed while taking this medication and for 7 months after stopping treatment. What side effects may I notice from receiving this medication? Side effects that you should report to your care team as soon as possible: Allergic reactions or angioedema--skin rash, itching or hives, swelling of the face, eyes, lips, tongue, arms, or legs, trouble swallowing or breathing Dry cough, shortness of breath or trouble breathing Heart failure--shortness of breath, swelling of the ankles, feet, or hands, sudden weight gain, unusual weakness or fatigue Infection--fever, chills, cough, or sore throat Infusion reactions--chest pain, shortness of breath or trouble breathing, feeling faint or  lightheaded Side effects that usually do not require medical attention (report to your care team if they continue or are bothersome): Diarrhea Dizziness Headache Nausea Trouble  sleeping Vomiting This list may not describe all possible side effects. Call your doctor for medical advice about side effects. You may report side effects to FDA at 1-800-FDA-1088. Where should I keep my medication? This medication is given in a hospital or clinic. It will not be stored at home. NOTE: This sheet is a summary. It may not cover all possible information. If you have questions about this medicine, talk to your doctor, pharmacist, or health care provider.  2023 Elsevier/Gold Standard (2021-12-21 00:00:00)   [facility]  Discharge Instructions: Thank you for choosing [facility] to provide your oncology and hematology care.  If you have a lab appointment with the [facility], please go directly to the [facility] and check in at the registration area.   Wear comfortable clothing and clothing appropriate for easy access to any Portacath or PICC line.   We strive to give you quality time with your provider. You may need to reschedule your appointment if you arrive late (15 or more minutes).  Arriving late affects you and other patients whose appointments are after yours.  Also, if you miss three or more appointments without notifying the office, you may be dismissed from the clinic at the provider's discretion.      For prescription refill requests, have your pharmacy contact our office and allow 72 hours for refills to be completed.    Today you received the following chemotherapy and/or immunotherapy agents trastuzumab      To help prevent nausea and vomiting after your treatment, we encourage you to take your nausea medication as directed.  BELOW ARE SYMPTOMS THAT SHOULD BE REPORTED IMMEDIATELY: *FEVER GREATER THAN 100.4 F (38 C) OR HIGHER *CHILLS OR SWEATING *NAUSEA AND VOMITING THAT IS NOT CONTROLLED WITH YOUR NAUSEA MEDICATION *UNUSUAL SHORTNESS OF BREATH *UNUSUAL BRUISING OR BLEEDING *URINARY PROBLEMS (pain or burning when  urinating, or frequent urination) *BOWEL PROBLEMS (unusual diarrhea, constipation, pain near the anus) TENDERNESS IN MOUTH AND THROAT WITH OR WITHOUT PRESENCE OF ULCERS (sore throat, sores in mouth, or a toothache) UNUSUAL RASH, SWELLING OR PAIN  UNUSUAL VAGINAL DISCHARGE OR ITCHING   Items with * indicate a potential emergency and should be followed up as soon as possible or go to the Emergency Department if any problems should occur.  Please show the CHEMOTHERAPY ALERT CARD or IMMUNOTHERAPY ALERT CARD at check-in to the Emergency Department and triage nurse.  Should you have questions after your visit or need to cancel or reschedule your appointment, please contact [facility]  Dept: [phone]  and follow the prompts.  Office hours are 8:00 a.m. to 4:30 p.m. Monday - Friday. Please note that voicemails left after 4:00 p.m. may not be returned until the following business day.  We are closed weekends and major holidays. You have access to a nurse at all times for urgent questions. Please call the main number to the clinic Dept: [phone] and follow the prompts.  For any non-urgent questions, you may also contact your provider using MyChart. We now offer e-Visits for anyone 54 and older to request care online for non-urgent symptoms. For details visit [email or web address].   Also download the MyChart app! Go to the app store, search "MyChart", open the app, select [facility], and log in with your MyChart username  and password.  Masks are optional in the cancer centers. If you would like for your care team to wear a mask while they are taking care of you, please let them know. You may have one support person who is at least 78 years old accompany you for your appointments.

## 2022-06-23 DIAGNOSIS — Z23 Encounter for immunization: Secondary | ICD-10-CM | POA: Diagnosis not present

## 2022-06-30 MED FILL — Trastuzumab-anns For IV Soln 150 MG: INTRAVENOUS | Qty: 19 | Status: AC

## 2022-07-01 ENCOUNTER — Inpatient Hospital Stay: Payer: Medicare Other | Attending: Oncology

## 2022-07-01 DIAGNOSIS — C787 Secondary malignant neoplasm of liver and intrahepatic bile duct: Secondary | ICD-10-CM | POA: Insufficient documentation

## 2022-07-01 DIAGNOSIS — Z170 Estrogen receptor positive status [ER+]: Secondary | ICD-10-CM | POA: Diagnosis not present

## 2022-07-01 DIAGNOSIS — Z5112 Encounter for antineoplastic immunotherapy: Secondary | ICD-10-CM | POA: Diagnosis not present

## 2022-07-01 DIAGNOSIS — Z79899 Other long term (current) drug therapy: Secondary | ICD-10-CM | POA: Insufficient documentation

## 2022-07-01 DIAGNOSIS — Z87891 Personal history of nicotine dependence: Secondary | ICD-10-CM | POA: Insufficient documentation

## 2022-07-01 DIAGNOSIS — C50912 Malignant neoplasm of unspecified site of left female breast: Secondary | ICD-10-CM | POA: Diagnosis not present

## 2022-07-01 MED ORDER — SODIUM CHLORIDE 0.9 % IV SOLN: Freq: Once | INTRAVENOUS | Status: AC

## 2022-07-01 MED ORDER — TRASTUZUMAB-ANNS CHEMO 150 MG IV SOLR
6.0000 mg/kg | Freq: Once | INTRAVENOUS | Status: AC
  Administered 2022-07-01: 399 mg via INTRAVENOUS
  Filled 2022-07-01: qty 19

## 2022-07-01 NOTE — Patient Instructions (Signed)
Trastuzumab Injection What is this medication? TRASTUZUMAB (tras TOO zoo mab) treats breast cancer and stomach cancer. It works by blocking a protein that causes cancer cells to grow and multiply. This helps to slow or stop the spread of cancer cells. This medicine may be used for other purposes; ask your health care provider or pharmacist if you have questions. COMMON BRAND NAME(S): Herceptin, Janae Bridgeman, Ontruzant, Trazimera What should I tell my care team before I take this medication? They need to know if you have any of these conditions: Heart failure Lung disease An unusual or allergic reaction to trastuzumab, other medications, foods, dyes, or preservatives Pregnant or trying to get pregnant Breast-feeding How should I use this medication? This medication is injected into a vein. It is given by your care team in a hospital or clinic setting. Talk to your care team about the use of this medication in children. It is not approved for use in children. Overdosage: If you think you have taken too much of this medicine contact a poison control center or emergency room at once. NOTE: This medicine is only for you. Do not share this medicine with others. What if I miss a dose? Keep appointments for follow-up doses. It is important not to miss your dose. Call your care team if you are unable to keep an appointment. What may interact with this medication? Certain types of chemotherapy, such as daunorubicin, doxorubicin, epirubicin, idarubicin This list may not describe all possible interactions. Give your health care provider a list of all the medicines, herbs, non-prescription drugs, or dietary supplements you use. Also tell them if you smoke, drink alcohol, or use illegal drugs. Some items may interact with your medicine. What should I watch for while using this medication? Your condition will be monitored carefully while you are receiving this medication. This medication may  make you feel generally unwell. This is not uncommon, as chemotherapy affects healthy cells as well as cancer cells. Report any side effects. Continue your course of treatment even though you feel ill unless your care team tells you to stop. This medication may increase your risk of getting an infection. Call your care team for advice if you get a fever, chills, sore throat, or other symptoms of a cold or flu. Do not treat yourself. Try to avoid being around people who are sick. Avoid taking medications that contain aspirin, acetaminophen, ibuprofen, naproxen, or ketoprofen unless instructed by your care team. These medications can hide a fever. Talk to your care team if you may be pregnant. Serious birth defects can occur if you take this medication during pregnancy and for 7 months after the last dose. You will need a negative pregnancy test before starting this medication. Contraception is recommended while taking this medication and for 7 months after the last dose. Your care team can help you find the option that works for you. Do not breastfeed while taking this medication and for 7 months after stopping treatment. What side effects may I notice from receiving this medication? Side effects that you should report to your care team as soon as possible: Allergic reactions or angioedema--skin rash, itching or hives, swelling of the face, eyes, lips, tongue, arms, or legs, trouble swallowing or breathing Dry cough, shortness of breath or trouble breathing Heart failure--shortness of breath, swelling of the ankles, feet, or hands, sudden weight gain, unusual weakness or fatigue Infection--fever, chills, cough, or sore throat Infusion reactions--chest pain, shortness of breath or trouble breathing, feeling faint or  lightheaded Side effects that usually do not require medical attention (report to your care team if they continue or are bothersome): Diarrhea Dizziness Headache Nausea Trouble  sleeping Vomiting This list may not describe all possible side effects. Call your doctor for medical advice about side effects. You may report side effects to FDA at 1-800-FDA-1088. Where should I keep my medication? This medication is given in a hospital or clinic. It will not be stored at home. NOTE: This sheet is a summary. It may not cover all possible information. If you have questions about this medicine, talk to your doctor, pharmacist, or health care provider.  2023 Elsevier/Gold Standard (2021-12-09 00:00:00)

## 2022-07-21 MED FILL — Trastuzumab-anns For IV Soln 150 MG: INTRAVENOUS | Qty: 19 | Status: AC

## 2022-07-22 ENCOUNTER — Inpatient Hospital Stay: Payer: Medicare Other | Attending: Oncology

## 2022-07-22 VITALS — BP 133/74 | HR 64 | Temp 97.5°F | Resp 18

## 2022-07-22 DIAGNOSIS — Z5112 Encounter for antineoplastic immunotherapy: Secondary | ICD-10-CM | POA: Insufficient documentation

## 2022-07-22 DIAGNOSIS — C787 Secondary malignant neoplasm of liver and intrahepatic bile duct: Secondary | ICD-10-CM | POA: Diagnosis not present

## 2022-07-22 DIAGNOSIS — C50912 Malignant neoplasm of unspecified site of left female breast: Secondary | ICD-10-CM

## 2022-07-22 MED ORDER — TRASTUZUMAB-ANNS CHEMO 150 MG IV SOLR
6.0000 mg/kg | Freq: Once | INTRAVENOUS | Status: AC
  Administered 2022-07-22: 399 mg via INTRAVENOUS
  Filled 2022-07-22: qty 19

## 2022-07-22 MED ORDER — SODIUM CHLORIDE 0.9 % IV SOLN: Freq: Once | INTRAVENOUS | Status: AC

## 2022-07-22 NOTE — Patient Instructions (Signed)
Trastuzumab Injection What is this medication? TRASTUZUMAB (tras TOO zoo mab) treats breast cancer and stomach cancer. It works by blocking a protein that causes cancer cells to grow and multiply. This helps to slow or stop the spread of cancer cells. This medicine may be used for other purposes; ask your health care provider or pharmacist if you have questions. COMMON BRAND NAME(S): Herceptin, Janae Bridgeman, Ontruzant, Trazimera What should I tell my care team before I take this medication? They need to know if you have any of these conditions: Heart failure Lung disease An unusual or allergic reaction to trastuzumab, other medications, foods, dyes, or preservatives Pregnant or trying to get pregnant Breast-feeding How should I use this medication? This medication is injected into a vein. It is given by your care team in a hospital or clinic setting. Talk to your care team about the use of this medication in children. It is not approved for use in children. Overdosage: If you think you have taken too much of this medicine contact a poison control center or emergency room at once. NOTE: This medicine is only for you. Do not share this medicine with others. What if I miss a dose? Keep appointments for follow-up doses. It is important not to miss your dose. Call your care team if you are unable to keep an appointment. What may interact with this medication? Certain types of chemotherapy, such as daunorubicin, doxorubicin, epirubicin, idarubicin This list may not describe all possible interactions. Give your health care provider a list of all the medicines, herbs, non-prescription drugs, or dietary supplements you use. Also tell them if you smoke, drink alcohol, or use illegal drugs. Some items may interact with your medicine. What should I watch for while using this medication? Your condition will be monitored carefully while you are receiving this medication. This medication may  make you feel generally unwell. This is not uncommon, as chemotherapy affects healthy cells as well as cancer cells. Report any side effects. Continue your course of treatment even though you feel ill unless your care team tells you to stop. This medication may increase your risk of getting an infection. Call your care team for advice if you get a fever, chills, sore throat, or other symptoms of a cold or flu. Do not treat yourself. Try to avoid being around people who are sick. Avoid taking medications that contain aspirin, acetaminophen, ibuprofen, naproxen, or ketoprofen unless instructed by your care team. These medications can hide a fever. Talk to your care team if you may be pregnant. Serious birth defects can occur if you take this medication during pregnancy and for 7 months after the last dose. You will need a negative pregnancy test before starting this medication. Contraception is recommended while taking this medication and for 7 months after the last dose. Your care team can help you find the option that works for you. Do not breastfeed while taking this medication and for 7 months after stopping treatment. What side effects may I notice from receiving this medication? Side effects that you should report to your care team as soon as possible: Allergic reactions or angioedema--skin rash, itching or hives, swelling of the face, eyes, lips, tongue, arms, or legs, trouble swallowing or breathing Dry cough, shortness of breath or trouble breathing Heart failure--shortness of breath, swelling of the ankles, feet, or hands, sudden weight gain, unusual weakness or fatigue Infection--fever, chills, cough, or sore throat Infusion reactions--chest pain, shortness of breath or trouble breathing, feeling faint or  lightheaded Side effects that usually do not require medical attention (report to your care team if they continue or are bothersome): Diarrhea Dizziness Headache Nausea Trouble  sleeping Vomiting This list may not describe all possible side effects. Call your doctor for medical advice about side effects. You may report side effects to FDA at 1-800-FDA-1088. Where should I keep my medication? This medication is given in a hospital or clinic. It will not be stored at home. NOTE: This sheet is a summary. It may not cover all possible information. If you have questions about this medicine, talk to your doctor, pharmacist, or health care provider.  2023 Elsevier/Gold Standard (2021-12-09 00:00:00) [facility]  Discharge Instructions: Thank you for choosing [facility] to provide your oncology and hematology care.  If you have a lab appointment with the [facility], please go directly to the [facility] and check in at the registration area.   Wear comfortable clothing and clothing appropriate for easy access to any Portacath or PICC line.   We strive to give you quality time with your provider. You may need to reschedule your appointment if you arrive late (15 or more minutes).  Arriving late affects you and other patients whose appointments are after yours.  Also, if you miss three or more appointments without notifying the office, you may be dismissed from the clinic at the provider's discretion.      For prescription refill requests, have your pharmacy contact our office and allow 72 hours for refills to be completed.    Today you received the following chemotherapy and/or immunotherapy agents TRASTUZUMAB      To help prevent nausea and vomiting after your treatment, we encourage you to take your nausea medication as directed.  BELOW ARE SYMPTOMS THAT SHOULD BE REPORTED IMMEDIATELY: *FEVER GREATER THAN 100.4 F (38 C) OR HIGHER *CHILLS OR SWEATING *NAUSEA AND VOMITING THAT IS NOT CONTROLLED WITH YOUR NAUSEA MEDICATION *UNUSUAL SHORTNESS OF BREATH *UNUSUAL BRUISING OR BLEEDING *URINARY PROBLEMS (pain or burning when  urinating, or frequent urination) *BOWEL PROBLEMS (unusual diarrhea, constipation, pain near the anus) TENDERNESS IN MOUTH AND THROAT WITH OR WITHOUT PRESENCE OF ULCERS (sore throat, sores in mouth, or a toothache) UNUSUAL RASH, SWELLING OR PAIN  UNUSUAL VAGINAL DISCHARGE OR ITCHING   Items with * indicate a potential emergency and should be followed up as soon as possible or go to the Emergency Department if any problems should occur.  Please show the CHEMOTHERAPY ALERT CARD or IMMUNOTHERAPY ALERT CARD at check-in to the Emergency Department and triage nurse.  Should you have questions after your visit or need to cancel or reschedule your appointment, please contact [facility]  Dept: [phone]  and follow the prompts.  Office hours are 8:00 a.m. to 4:30 p.m. Monday - Friday. Please note that voicemails left after 4:00 p.m. may not be returned until the following business day.  We are closed weekends and major holidays. You have access to a nurse at all times for urgent questions. Please call the main number to the clinic Dept: [phone] and follow the prompts.  For any non-urgent questions, you may also contact your provider using MyChart. We now offer e-Visits for anyone 73 and older to request care online for non-urgent symptoms. For details visit [email or web address].   Also download the MyChart app! Go to the app store, search "MyChart", open the app, select [facility], and log in with your MyChart username and password.  Masks are optional in the cancer centers. If you would like for your care team to wear a mask while they are taking care of you, please let them know. You may have one support person who is at least 78 years old accompany you for your appointments.

## 2022-07-23 ENCOUNTER — Other Ambulatory Visit: Payer: Self-pay

## 2022-07-26 DIAGNOSIS — C50912 Malignant neoplasm of unspecified site of left female breast: Secondary | ICD-10-CM | POA: Diagnosis not present

## 2022-07-26 DIAGNOSIS — M7989 Other specified soft tissue disorders: Secondary | ICD-10-CM | POA: Diagnosis not present

## 2022-07-26 DIAGNOSIS — Z6824 Body mass index (BMI) 24.0-24.9, adult: Secondary | ICD-10-CM | POA: Diagnosis not present

## 2022-07-26 DIAGNOSIS — M5137 Other intervertebral disc degeneration, lumbosacral region: Secondary | ICD-10-CM | POA: Diagnosis not present

## 2022-07-26 DIAGNOSIS — Z79899 Other long term (current) drug therapy: Secondary | ICD-10-CM | POA: Diagnosis not present

## 2022-07-26 DIAGNOSIS — I10 Essential (primary) hypertension: Secondary | ICD-10-CM | POA: Diagnosis not present

## 2022-07-26 DIAGNOSIS — E039 Hypothyroidism, unspecified: Secondary | ICD-10-CM | POA: Diagnosis not present

## 2022-07-26 DIAGNOSIS — R0602 Shortness of breath: Secondary | ICD-10-CM | POA: Diagnosis not present

## 2022-07-26 DIAGNOSIS — I700 Atherosclerosis of aorta: Secondary | ICD-10-CM | POA: Diagnosis not present

## 2022-07-26 DIAGNOSIS — C787 Secondary malignant neoplasm of liver and intrahepatic bile duct: Secondary | ICD-10-CM | POA: Diagnosis not present

## 2022-07-26 DIAGNOSIS — K219 Gastro-esophageal reflux disease without esophagitis: Secondary | ICD-10-CM | POA: Diagnosis not present

## 2022-07-27 DIAGNOSIS — R600 Localized edema: Secondary | ICD-10-CM | POA: Diagnosis not present

## 2022-07-27 DIAGNOSIS — R7989 Other specified abnormal findings of blood chemistry: Secondary | ICD-10-CM | POA: Diagnosis not present

## 2022-07-27 DIAGNOSIS — M7989 Other specified soft tissue disorders: Secondary | ICD-10-CM | POA: Diagnosis not present

## 2022-07-28 LAB — CBC AND DIFFERENTIAL
HCT: 35 — AB (ref 36–46)
Hemoglobin: 12.5 (ref 12.0–16.0)
Neutrophils Absolute: 4.4
Platelets: 189 10*3/uL (ref 150–400)
WBC: 6.8

## 2022-07-28 LAB — BASIC METABOLIC PANEL
BUN: 5 (ref 4–21)
CO2: 24 — AB (ref 13–22)
Chloride: 92 — AB (ref 99–108)
Creatinine: 0.8 (ref 0.5–1.1)
Glucose: 96
Potassium: 4.1 mEq/L (ref 3.5–5.1)
Sodium: 134 — AB (ref 137–147)

## 2022-07-28 LAB — HEPATIC FUNCTION PANEL
ALT: 52 U/L — AB (ref 7–35)
AST: 97 — AB (ref 13–35)
Alkaline Phosphatase: 86 (ref 25–125)
Bilirubin, Total: 0.3

## 2022-07-28 LAB — COMPREHENSIVE METABOLIC PANEL
Albumin: 4.4 (ref 3.5–5.0)
Calcium: 9.1 (ref 8.7–10.7)

## 2022-07-28 LAB — CBC: RBC: 3.82 — AB (ref 3.87–5.11)

## 2022-07-28 LAB — TSH: TSH: 3.06 (ref 0.41–5.90)

## 2022-08-04 DIAGNOSIS — R0781 Pleurodynia: Secondary | ICD-10-CM | POA: Diagnosis not present

## 2022-08-04 DIAGNOSIS — M419 Scoliosis, unspecified: Secondary | ICD-10-CM | POA: Diagnosis not present

## 2022-08-04 DIAGNOSIS — M954 Acquired deformity of chest and rib: Secondary | ICD-10-CM | POA: Diagnosis not present

## 2022-08-04 DIAGNOSIS — L578 Other skin changes due to chronic exposure to nonionizing radiation: Secondary | ICD-10-CM | POA: Diagnosis not present

## 2022-08-04 DIAGNOSIS — S2241XA Multiple fractures of ribs, right side, initial encounter for closed fracture: Secondary | ICD-10-CM | POA: Diagnosis not present

## 2022-08-04 DIAGNOSIS — L821 Other seborrheic keratosis: Secondary | ICD-10-CM | POA: Diagnosis not present

## 2022-08-04 DIAGNOSIS — I771 Stricture of artery: Secondary | ICD-10-CM | POA: Diagnosis not present

## 2022-08-04 DIAGNOSIS — L570 Actinic keratosis: Secondary | ICD-10-CM | POA: Diagnosis not present

## 2022-08-10 ENCOUNTER — Other Ambulatory Visit: Payer: Medicare Other

## 2022-08-10 ENCOUNTER — Ambulatory Visit: Payer: Medicare Other | Admitting: Hematology and Oncology

## 2022-08-11 MED FILL — Trastuzumab-anns For IV Soln 150 MG: INTRAVENOUS | Qty: 19 | Status: AC

## 2022-08-12 ENCOUNTER — Inpatient Hospital Stay: Payer: Medicare Other

## 2022-08-12 VITALS — BP 135/68 | HR 65 | Temp 98.1°F | Resp 18 | Ht 60.0 in | Wt 130.0 lb

## 2022-08-12 DIAGNOSIS — C50912 Malignant neoplasm of unspecified site of left female breast: Secondary | ICD-10-CM

## 2022-08-12 DIAGNOSIS — C787 Secondary malignant neoplasm of liver and intrahepatic bile duct: Secondary | ICD-10-CM | POA: Diagnosis not present

## 2022-08-12 DIAGNOSIS — Z5112 Encounter for antineoplastic immunotherapy: Secondary | ICD-10-CM | POA: Diagnosis not present

## 2022-08-12 MED ORDER — SODIUM CHLORIDE 0.9 % IV SOLN: Freq: Once | INTRAVENOUS | Status: AC

## 2022-08-12 MED ORDER — TRASTUZUMAB-ANNS CHEMO 150 MG IV SOLR
6.0000 mg/kg | Freq: Once | INTRAVENOUS | Status: AC
  Administered 2022-08-12: 399 mg via INTRAVENOUS
  Filled 2022-08-12: qty 19

## 2022-08-12 NOTE — Patient Instructions (Signed)
Trastuzumab Injection What is this medication? TRASTUZUMAB (tras TOO zoo mab) treats breast cancer and stomach cancer. It works by blocking a protein that causes cancer cells to grow and multiply. This helps to slow or stop the spread of cancer cells. This medicine may be used for other purposes; ask your health care provider or pharmacist if you have questions. COMMON BRAND NAME(S): Herceptin, Janae Bridgeman, Ontruzant, Trazimera What should I tell my care team before I take this medication? They need to know if you have any of these conditions: Heart failure Lung disease An unusual or allergic reaction to trastuzumab, other medications, foods, dyes, or preservatives Pregnant or trying to get pregnant Breast-feeding How should I use this medication? This medication is injected into a vein. It is given by your care team in a hospital or clinic setting. Talk to your care team about the use of this medication in children. It is not approved for use in children. Overdosage: If you think you have taken too much of this medicine contact a poison control center or emergency room at once. NOTE: This medicine is only for you. Do not share this medicine with others. What if I miss a dose? Keep appointments for follow-up doses. It is important not to miss your dose. Call your care team if you are unable to keep an appointment. What may interact with this medication? Certain types of chemotherapy, such as daunorubicin, doxorubicin, epirubicin, idarubicin This list may not describe all possible interactions. Give your health care provider a list of all the medicines, herbs, non-prescription drugs, or dietary supplements you use. Also tell them if you smoke, drink alcohol, or use illegal drugs. Some items may interact with your medicine. What should I watch for while using this medication? Your condition will be monitored carefully while you are receiving this medication. This medication may  make you feel generally unwell. This is not uncommon, as chemotherapy affects healthy cells as well as cancer cells. Report any side effects. Continue your course of treatment even though you feel ill unless your care team tells you to stop. This medication may increase your risk of getting an infection. Call your care team for advice if you get a fever, chills, sore throat, or other symptoms of a cold or flu. Do not treat yourself. Try to avoid being around people who are sick. Avoid taking medications that contain aspirin, acetaminophen, ibuprofen, naproxen, or ketoprofen unless instructed by your care team. These medications can hide a fever. Talk to your care team if you may be pregnant. Serious birth defects can occur if you take this medication during pregnancy and for 7 months after the last dose. You will need a negative pregnancy test before starting this medication. Contraception is recommended while taking this medication and for 7 months after the last dose. Your care team can help you find the option that works for you. Do not breastfeed while taking this medication and for 7 months after stopping treatment. What side effects may I notice from receiving this medication? Side effects that you should report to your care team as soon as possible: Allergic reactions or angioedema--skin rash, itching or hives, swelling of the face, eyes, lips, tongue, arms, or legs, trouble swallowing or breathing Dry cough, shortness of breath or trouble breathing Heart failure--shortness of breath, swelling of the ankles, feet, or hands, sudden weight gain, unusual weakness or fatigue Infection--fever, chills, cough, or sore throat Infusion reactions--chest pain, shortness of breath or trouble breathing, feeling faint or  lightheaded Side effects that usually do not require medical attention (report to your care team if they continue or are bothersome): Diarrhea Dizziness Headache Nausea Trouble  sleeping Vomiting This list may not describe all possible side effects. Call your doctor for medical advice about side effects. You may report side effects to FDA at 1-800-FDA-1088. Where should I keep my medication? This medication is given in a hospital or clinic. It will not be stored at home. NOTE: This sheet is a summary. It may not cover all possible information. If you have questions about this medicine, talk to your doctor, pharmacist, or health care provider.  2023 Elsevier/Gold Standard (2021-12-09 00:00:00)

## 2022-08-19 ENCOUNTER — Ambulatory Visit: Payer: Medicare Other | Admitting: Obstetrics and Gynecology

## 2022-08-23 ENCOUNTER — Encounter: Payer: Self-pay | Admitting: Oncology

## 2022-08-26 ENCOUNTER — Encounter: Payer: Self-pay | Admitting: Oncology

## 2022-08-30 ENCOUNTER — Inpatient Hospital Stay (INDEPENDENT_AMBULATORY_CARE_PROVIDER_SITE_OTHER): Payer: Medicare Other | Admitting: Oncology

## 2022-08-30 ENCOUNTER — Encounter: Payer: Self-pay | Admitting: Oncology

## 2022-08-30 ENCOUNTER — Other Ambulatory Visit: Payer: Self-pay | Admitting: Oncology

## 2022-08-30 ENCOUNTER — Other Ambulatory Visit: Payer: Self-pay

## 2022-08-30 ENCOUNTER — Inpatient Hospital Stay: Payer: Medicare Other | Attending: Oncology

## 2022-08-30 VITALS — BP 167/62 | HR 59 | Temp 98.5°F | Resp 18 | Ht 60.0 in | Wt 135.3 lb

## 2022-08-30 DIAGNOSIS — Z79899 Other long term (current) drug therapy: Secondary | ICD-10-CM | POA: Diagnosis not present

## 2022-08-30 DIAGNOSIS — C787 Secondary malignant neoplasm of liver and intrahepatic bile duct: Secondary | ICD-10-CM

## 2022-08-30 DIAGNOSIS — R945 Abnormal results of liver function studies: Secondary | ICD-10-CM | POA: Insufficient documentation

## 2022-08-30 DIAGNOSIS — Z170 Estrogen receptor positive status [ER+]: Secondary | ICD-10-CM | POA: Diagnosis not present

## 2022-08-30 DIAGNOSIS — M8000XG Age-related osteoporosis with current pathological fracture, unspecified site, subsequent encounter for fracture with delayed healing: Secondary | ICD-10-CM

## 2022-08-30 DIAGNOSIS — C50912 Malignant neoplasm of unspecified site of left female breast: Secondary | ICD-10-CM | POA: Diagnosis not present

## 2022-08-30 DIAGNOSIS — T50915S Adverse effect of multiple unspecified drugs, medicaments and biological substances, sequela: Secondary | ICD-10-CM

## 2022-08-30 DIAGNOSIS — Z1231 Encounter for screening mammogram for malignant neoplasm of breast: Secondary | ICD-10-CM

## 2022-08-30 DIAGNOSIS — Z5112 Encounter for antineoplastic immunotherapy: Secondary | ICD-10-CM | POA: Diagnosis not present

## 2022-08-30 LAB — CMP (CANCER CENTER ONLY)
ALT: 16 U/L (ref 0–44)
AST: 25 U/L (ref 15–41)
Albumin: 4.1 g/dL (ref 3.5–5.0)
Alkaline Phosphatase: 86 U/L (ref 38–126)
Anion gap: 9 (ref 5–15)
BUN: 13 mg/dL (ref 8–23)
CO2: 28 mmol/L (ref 22–32)
Calcium: 9 mg/dL (ref 8.9–10.3)
Chloride: 98 mmol/L (ref 98–111)
Creatinine: 0.73 mg/dL (ref 0.44–1.00)
GFR, Estimated: >60 mL/min (ref >60)
Glucose, Bld: 103 mg/dL — ABNORMAL HIGH (ref 70–99)
Potassium: 4.7 mmol/L (ref 3.5–5.1)
Sodium: 135 mmol/L (ref 135–145)
Total Bilirubin: 0.5 mg/dL (ref 0.3–1.2)
Total Protein: 7.5 g/dL (ref 6.5–8.1)

## 2022-08-30 LAB — CBC WITH DIFFERENTIAL (CANCER CENTER ONLY)
Abs Immature Granulocytes: 0.02 10*3/uL (ref 0.00–0.07)
Basophils Absolute: 0 10*3/uL (ref 0.0–0.1)
Basophils Relative: 0 %
Eosinophils Absolute: 0.1 10*3/uL (ref 0.0–0.5)
Eosinophils Relative: 2 %
HCT: 39.8 % (ref 36.0–46.0)
Hemoglobin: 12.8 g/dL (ref 12.0–15.0)
Immature Granulocytes: 0 %
Lymphocytes Relative: 22 %
Lymphs Abs: 1.5 10*3/uL (ref 0.7–4.0)
MCH: 31.7 pg (ref 26.0–34.0)
MCHC: 32.2 g/dL (ref 30.0–36.0)
MCV: 98.5 fL (ref 80.0–100.0)
Monocytes Absolute: 0.6 10*3/uL (ref 0.1–1.0)
Monocytes Relative: 10 %
Neutro Abs: 4.3 10*3/uL (ref 1.7–7.7)
Neutrophils Relative %: 66 %
Platelet Count: 183 10*3/uL (ref 150–400)
RBC: 4.04 MIL/uL (ref 3.87–5.11)
RDW: 12.3 % (ref 11.5–15.5)
WBC Count: 6.6 10*3/uL (ref 4.0–10.5)
nRBC: 0 % (ref 0.0–0.2)

## 2022-08-30 LAB — HEPATITIS PANEL, ACUTE
HCV Ab: NONREACTIVE
Hep A IgM: NONREACTIVE
Hep B C IgM: NONREACTIVE
Hepatitis B Surface Ag: NONREACTIVE

## 2022-08-30 NOTE — Progress Notes (Signed)
[facility]  [address] [place],  [place]  [address] [phone]  Clinic Day:  08/30/22   Referring physician: Philemon Kingdom, MD  CHIEF COMPLAINT:  CC: Stage IV breast cancer  Current Treatment:  Trastuzumab IV every 3 weeks   HISTORY OF PRESENT ILLNESS:  Karen Allison is a 77 y.o. 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  Estrogen and progesterone receptors were positive and her 2 was positive, and scans revealed liver metastases.  She was placed on a clinical trial at [facility] consisting of letrozole and trastuzumab, but she stopped the letrozole after several months because of severe arthralgias.  She also has known osteoarthritis, degenerative disc disease, and osteoporosis, and has had several back surgeries.  She is been on single agent trastuzumab now for over 12 years with good control of her disease.  This was stopped temporarily in 2011 but a PET scan a few months later revealed a new 9 mm lung nodule felt to represent recurrent disease, which did resolve after she was placed back on the trastuzumab.  She sees Dr. Costella Hatcher at [facility] yearly now, usually in July and he does scans and labs at that time.  We do perform periodic echocardiograms and her last one was in July.  She did have her last bone density scan in July of 2015, with no change, and receives Reclast every 6 months.  She had cervical disc surgery in September of 2016, and then developed a hospital-acquired pneumonia which took a long time to clear.  She had colonoscopy within the last 2 years.  She had a fall in September with 2 fractured ribs.  Her medication list was reviewed.  When I review her Reclast doses, she received this every 6 months from July of 2014 to January of 2016, and then yearly, with the last dose in January of 2017.  She was having  some tooth sensitivity but this is no longer an issue.  I did review her family history and the patient had endometrial cancer in her 29's and breast cancer in her 25's. She has a first cousin who had breast cancer and a paternal grandmother who had liver cancer, possibly metastatic.  An aunt had liver cancer.  A step uncle had multiple daughters with breast cancer.  The patient was seen in November of 2017 because of a tenderness and possible lump in the right upper outer quadrant.  A mammogram and ultrasound just revealed normal fibro fatty tissue.  She was evaluated at [facility] by Dr. Costella Hatcher in July of 2017 and there were some concerns about her scans.  There appeared to be a rim enhancing fluid collection intramuscular in the left vastus lateralis muscle measuring 2 cm which appeared new.  There was a new right posterior 6th rib expansile sclerotic lesion measuring 2.3 cm as well as an old healed right 5th rib fracture which had been stable from the year before.  She did have an MRI of the hip in August which was nonspecific with extensive postoperative findings in the lumbar spine as well as osteoarthritis, osteophytes, and degenerative changes.  There was a simple appearing fluid collection interposed between the iliotibial band and proximal margin of the vastus lateralis consistent with a small ganglion cyst or prior shearing injury.  We repeated the CT of the chest in January of 2018  to follow up on the expansile lesion of the rib and this remained stable, and consistent with posttraumatic fracture.  She also fractured her right scapula after a fall and she has fractured several ribs on the right.  Her last ECHO in March 2022 remains normal, with an EF of 55 to 60%.  INTERVAL HISTORY:  Karen Allison is here for annual follow up and is doing fairly well.  She continues to get Herceptin every 3 weeks, her last echocardiogram was in February of 2023, so I will schedule one now. Her labs from 12/7 reveals liver  transiminases were elevated with a SGOT 97 and SGPT of 57, this is a new finding for her. She denies any exposure to hepatitis, she has no symptoms of jaundice.  She states she fell about 4 weeks ago and broke some ribs. She noted in the front that her ribs or around that area was tender, but has improved since. Mammogram was in May was normal. Bone density in May was stable. CT scans in July at [facility], which was stable. If her tests from today are not normal we will schedule another CT of the liver. We will add Hepatitis panel to her labs today. Her  appetite is good, and she has gained 5 pounds since her last visit. She denies fever, chills or other signs of infection.  She denies nausea, vomiting, bowel issues, or abdominal pain.  She denies sore throat, cough, dyspnea, or chest pain.  REVIEW OF SYSTEMS:  Review of Systems  Constitutional: Negative.  Negative for appetite change, chills, fatigue, fever and unexpected weight change.  HENT:  Negative.    Eyes: Negative.   Respiratory: Negative.  Negative for chest tightness, cough, hemoptysis, shortness of breath and wheezing.   Cardiovascular: Negative.  Negative for chest pain, leg swelling and palpitations.  Gastrointestinal: Negative.  Negative for abdominal distention, abdominal pain, blood in stool, constipation, diarrhea, nausea and vomiting.  Endocrine: Negative.   Genitourinary: Negative.  Negative for difficulty urinating, dysuria, frequency and hematuria.   Musculoskeletal:  Positive for back pain. Negative for arthralgias, flank pain, gait problem (with three falls this past month, using a walker) and myalgias.       She has osteoporosis with many prior fractures  Skin: Negative.   Neurological:  Negative for dizziness, extremity weakness, gait problem (with three falls this past month, using a walker), headaches, light-headedness, numbness, seizures and speech difficulty.  Hematological: Negative.   Psychiatric/Behavioral:  Positive for  depression. Negative for sleep disturbance. The patient is nervous/anxious.      VITALS:  Blood pressure (!) 167/62, pulse (!) 59, temperature 98.5 F (36.9 C), temperature source Oral, resp. rate 18, height 5' (1.524 m), weight 135 lb 4.8 oz (61.4 kg), SpO2 99 %.  Wt Readings from Last 3 Encounters:  09/02/22 136 lb 1.9 oz (61.7 kg)  08/30/22 135 lb 4.8 oz (61.4 kg)  08/12/22 130 lb 0.6 oz (59 kg)    Body mass index is 26.42 kg/m.  Performance status (ECOG): 1 - Symptomatic but completely ambulatory  PHYSICAL EXAM:  Physical Exam Constitutional:      General: She is not in acute distress.    Appearance: Normal appearance. She is normal weight.  HENT:     Head: Normocephalic and atraumatic.  Eyes:     General: No scleral icterus.    Extraocular Movements: Extraocular movements intact.     Conjunctiva/sclera: Conjunctivae normal.     Pupils: Pupils are equal, round, and reactive to light.  Cardiovascular:     Rate and Rhythm: Normal rate and regular rhythm.     Pulses: Normal pulses.     Heart sounds: Normal heart sounds. No murmur heard.    No friction rub. No gallop.  Pulmonary:     Effort: Pulmonary effort is normal. No respiratory distress.     Breath sounds: Rales present.     Comments: A few crackles I the left base Chest:     Chest wall: No mass.  Breasts:    Right: Normal. No mass.     Left: Normal. No mass.     Comments: Scar superior to the left areolar complex which is well healed. No masses in either breast.  Abdominal:     General: Bowel sounds are normal. There is no distension.     Palpations: Abdomen is soft. There is no hepatomegaly, splenomegaly or mass.     Tenderness: There is no abdominal tenderness.  Musculoskeletal:        General: Normal range of motion.     Cervical back: Normal range of motion and neck supple.     Right lower leg: No edema.     Left lower leg: No edema.  Lymphadenopathy:     Cervical: No cervical adenopathy.  Skin:     General: Skin is warm and dry.  Neurological:     General: No focal deficit present.     Mental Status: She is alert and oriented to person, place, and time. Mental status is at baseline.  Psychiatric:        Mood and Affect: Mood normal.        Behavior: Behavior normal.        Thought Content: Thought content normal.        Judgment: Judgment normal.     LABS:      Latest Ref Rng & Units 08/30/2022   11:50 AM 07/28/2022   12:00 AM 11/17/2021   12:00 AM  CBC  WBC 4.0 - 10.5 K/uL 6.6  6.8     5.7      Hemoglobin 12.0 - 15.0 g/dL 12.8  12.5     13.3      Hematocrit 36.0 - 46.0 % 39.8  35     40      Platelets 150 - 400 K/uL 183  189     172         This result is from an external source.      Latest Ref Rng & Units 08/30/2022   11:50 AM 07/28/2022   12:00 AM 11/17/2021   12:00 AM  CMP  Glucose 70 - 99 mg/dL 103     BUN 8 - 23 mg/dL 13  5     12       Creatinine 0.44 - 1.00 mg/dL 0.73  0.8     0.7      Sodium 135 - 145 mmol/L 135  134     131      Potassium 3.5 - 5.1 mmol/L 4.7  4.1     4.0      Chloride 98 - 111 mmol/L 98  92     94      CO2 22 - 32 mmol/L 28  24     34      Calcium 8.9 - 10.3 mg/dL 9.0  9.1     9.0      Total Protein 6.5 - 8.1 g/dL 7.5     Total Bilirubin 0.3 -  1.2 mg/dL 0.5     Alkaline Phos 38 - 126 U/L 86  86     79      AST 15 - 41 U/L 25  97     36      ALT 0 - 44 U/L 16  52     22         This result is from an external source.     Lab Results  Component Value Date   LDH 230 03/10/2006     STUDIES:  EXAM:08/04/22 RIGHT RIBS ANDS CHEST 3+ VIEW IMPRESSION: Slightly displaced fracture of the posterolateral right eleventh rib. Deformity of the posterolateral right tenth rib is compatible with an old healed fracture, but refracture though the old injury may be present because there is a possible slight cortical step-off. Chronic healed fracture deformities of the right fifth through ninth ribs.     Allergies:  Allergies  Allergen Reactions    Cefuroxime Hives   Zithromax [Azithromycin] Hives    Current Medications: Current Outpatient Medications  Medication Sig Dispense Refill   doxycycline (VIBRAMYCIN) 100 MG capsule Take 100 mg by mouth 2 (two) times daily.     furosemide (LASIX) 20 MG tablet Take 20-40 mg by mouth daily as needed.     rosuvastatin (CRESTOR) 10 MG tablet Take 10 mg by mouth once a week.     aspirin EC 81 MG tablet Take 1 tablet by mouth daily.     atenolol (TENORMIN) 25 MG tablet Take 25 mg by mouth daily.      B Complex Vitamins (B COMPLEX PO) Take by mouth.     Calcium Carbonate-Vitamin D (CALTRATE 600+D PO) Take 1 tablet by mouth 2 (two) times daily.     celecoxib (CELEBREX) 200 MG capsule Take 200 mg by mouth daily.      cholecalciferol (VITAMIN D3) 25 MCG (1000 UNIT) tablet 1 tablet     diazepam (VALIUM) 10 MG tablet Take 10 mg by mouth at bedtime.      diclofenac sodium (VOLTAREN) 1 % GEL Apply 4 g topically 4 (four) times daily. 350 g 3   docusate sodium (COLACE) 100 MG capsule Take 200 mg by mouth 2 (two) times daily as needed for mild constipation.      FLUAD 0.5 ML SUSY See admin instructions.  0   fluconazole (DIFLUCAN) 150 MG tablet Take 150 mg by mouth once.     gabapentin (NEURONTIN) 600 MG tablet Take 600-1,200 mg by mouth 4 (four) times daily - after meals and at bedtime. 1 tablet (600 mg) 3 times a day and 2 tablets(1200mg ) at bedtime     HYDROcodone-acetaminophen (NORCO) 10-325 MG tablet Take 1 tablet by mouth every 6 (six) hours as needed.     ketoconazole (NIZORAL) 2 % shampoo      levothyroxine (SYNTHROID, LEVOTHROID) 50 MCG tablet Take 50 mcg by mouth daily.     methocarbamol (ROBAXIN) 500 MG tablet Take 500 mg by mouth 4 (four) times daily.     metroNIDAZOLE (METROCREAM) 0.75 % cream Apply topically.     Multiple Vitamin (MULTIVITAMIN WITH MINERALS) TABS Take 1 tablet by mouth daily.     omeprazole (PRILOSEC) 20 MG capsule Take 1 capsule (20 mg total) by mouth 2 (two) times daily  before a meal. 60 capsule 3   ondansetron (ZOFRAN) 8 MG tablet Take 8 mg by mouth 2 (two) times daily as needed.     promethazine (PHENERGAN) 25 MG tablet Take 25  mg by mouth every 6 (six) hours as needed for nausea or vomiting.     sennosides-docusate sodium (SENOKOT-S) 8.6-50 MG tablet Take 1 tablet by mouth daily.     trastuzumab (HERCEPTIN) 440 MG injection 440 mg by Intravenous (Continuous Infusion) route every 21 ( twenty-one) days. infusion every 3 weeks     No current facility-administered medications for this visit.     ASSESSMENT & PLAN:   Assessment:   1.  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 diagnosed in December 2003.  Subsequent x-rays have shown possible bone metastases as well.  She was treated for a brief time with hormonal therapy but was unable to tolerate that.  She currently is on trastuzumab every 3 weeks and continues to have her disease under control. Her last scans were done at [facility] in July of 2023, and were clear.   2.  Osteoporosis, for which she is on yearly Reclast since July 2014.  She has had many fractures of spine, ribs, scapula.  However, she has now completed 10 doses and so I recommended that we stop the Reclast and continue to monitor her bone density.  Her bone density scan in May was stable.   3.  Mild chronic hyponatremia of unknown etiology.  Her sodium is stable at 131.  4. New abnormal liver function test of unknown etiology. I will check a hepatitis panel and repeat this lab if her liver enzymes remain abnormal, I will order a CT scan of the liver. She was just started on Crestor 1 pill weekly after these test were drawn.   5. Multiple old and new rib fractures.   Plan: I am repeating her labs today and will call her on those. I will check hepatitis panel and she may need a CT if these liver enzymes remain abnormal. She will plan to go to [facility] in July for repeeat evaluations and  scans. She will be due for annual mammogram in May. I can see her back in 1 year for reexamination with CBC and CMP.  We will schedule echocardiogram for either this month or February.  She knows to call sooner if problems arise regarding her breast cancer or its treatment.  She understands and agrees with this plan of care.   I provided 20 minutes of face-to-face time during this this encounter and > 50% was spent counseling as documented under my assessment and plan.    ADDENDUM: Her repeat liver function tests are completely normal and hepatitis testing is negative.  ECHO looks good with EF of 55-60%.   Dellia Beckwith, MD [facility] [address] [place] [address] Dept: [phone] Dept Fax: [phone]    Avelina Laine as a scribe for Dellia Beckwith, MD.,have documented all relevant documentation on the behalf of Dellia Beckwith, MD,as directed by  Dellia Beckwith, MD while in the presence of Dellia Beckwith, MD.

## 2022-08-31 ENCOUNTER — Other Ambulatory Visit: Payer: Self-pay

## 2022-09-01 ENCOUNTER — Other Ambulatory Visit: Payer: Self-pay | Admitting: Oncology

## 2022-09-01 DIAGNOSIS — C50912 Malignant neoplasm of unspecified site of left female breast: Secondary | ICD-10-CM

## 2022-09-01 MED FILL — Trastuzumab-anns For IV Soln 150 MG: INTRAVENOUS | Qty: 18 | Status: AC

## 2022-09-02 ENCOUNTER — Inpatient Hospital Stay: Payer: Medicare Other

## 2022-09-02 VITALS — BP 167/65 | HR 59 | Temp 97.7°F | Resp 14 | Ht 60.0 in | Wt 136.1 lb

## 2022-09-02 DIAGNOSIS — C50912 Malignant neoplasm of unspecified site of left female breast: Secondary | ICD-10-CM

## 2022-09-02 DIAGNOSIS — R945 Abnormal results of liver function studies: Secondary | ICD-10-CM | POA: Diagnosis not present

## 2022-09-02 DIAGNOSIS — C787 Secondary malignant neoplasm of liver and intrahepatic bile duct: Secondary | ICD-10-CM | POA: Diagnosis not present

## 2022-09-02 DIAGNOSIS — Z79899 Other long term (current) drug therapy: Secondary | ICD-10-CM | POA: Diagnosis not present

## 2022-09-02 DIAGNOSIS — Z5112 Encounter for antineoplastic immunotherapy: Secondary | ICD-10-CM | POA: Diagnosis not present

## 2022-09-02 MED ORDER — TRASTUZUMAB-ANNS CHEMO 150 MG IV SOLR
5.7000 mg/kg | Freq: Once | INTRAVENOUS | Status: AC
  Administered 2022-09-02: 378 mg via INTRAVENOUS
  Filled 2022-09-02: qty 18

## 2022-09-02 MED ORDER — SODIUM CHLORIDE 0.9 % IV SOLN: Freq: Once | INTRAVENOUS | Status: AC

## 2022-09-02 NOTE — Patient Instructions (Signed)
Trastuzumab Injection What is this medication? TRASTUZUMAB (tras TOO zoo mab) treats breast cancer and stomach cancer. It works by blocking a protein that causes cancer cells to grow and multiply. This helps to slow or stop the spread of cancer cells. This medicine may be used for other purposes; ask your health care provider or pharmacist if you have questions. COMMON BRAND NAME(S): Herceptin, Marlowe Alt, Ontruzant, Trazimera What should I tell my care team before I take this medication? They need to know if you have any of these conditions: Heart failure Lung disease An unusual or allergic reaction to trastuzumab, other medications, foods, dyes, or preservatives Pregnant or trying to get pregnant Breast-feeding How should I use this medication? This medication is injected into a vein. It is given by your care team in a hospital or clinic setting. Talk to your care team about the use of this medication in children. It is not approved for use in children. Overdosage: If you think you have taken too much of this medicine contact a poison control center or emergency room at once. NOTE: This medicine is only for you. Do not share this medicine with others. What if I miss a dose? Keep appointments for follow-up doses. It is important not to miss your dose. Call your care team if you are unable to keep an appointment. What may interact with this medication? Certain types of chemotherapy, such as daunorubicin, doxorubicin, epirubicin, idarubicin This list may not describe all possible interactions. Give your health care provider a list of all the medicines, herbs, non-prescription drugs, or dietary supplements you use. Also tell them if you smoke, drink alcohol, or use illegal drugs. Some items may interact with your medicine. What should I watch for while using this medication? Your condition will be monitored carefully while you are receiving this medication. This medication may  make you feel generally unwell. This is not uncommon, as chemotherapy affects healthy cells as well as cancer cells. Report any side effects. Continue your course of treatment even though you feel ill unless your care team tells you to stop. This medication may increase your risk of getting an infection. Call your care team for advice if you get a fever, chills, sore throat, or other symptoms of a cold or flu. Do not treat yourself. Try to avoid being around people who are sick. Avoid taking medications that contain aspirin, acetaminophen, ibuprofen, naproxen, or ketoprofen unless instructed by your care team. These medications can hide a fever. Talk to your care team if you may be pregnant. Serious birth defects can occur if you take this medication during pregnancy and for 7 months after the last dose. You will need a negative pregnancy test before starting this medication. Contraception is recommended while taking this medication and for 7 months after the last dose. Your care team can help you find the option that works for you. Do not breastfeed while taking this medication and for 7 months after stopping treatment. What side effects may I notice from receiving this medication? Side effects that you should report to your care team as soon as possible: Allergic reactions or angioedema--skin rash, itching or hives, swelling of the face, eyes, lips, tongue, arms, or legs, trouble swallowing or breathing Dry cough, shortness of breath or trouble breathing Heart failure--shortness of breath, swelling of the ankles, feet, or hands, sudden weight gain, unusual weakness or fatigue Infection--fever, chills, cough, or sore throat Infusion reactions--chest pain, shortness of breath or trouble breathing, feeling faint or  lightheaded Side effects that usually do not require medical attention (report to your care team if they continue or are bothersome): Diarrhea Dizziness Headache Nausea Trouble  sleeping Vomiting This list may not describe all possible side effects. Call your doctor for medical advice about side effects. You may report side effects to FDA at 1-800-FDA-1088. Where should I keep my medication? This medication is given in a hospital or clinic. It will not be stored at home. NOTE: This sheet is a summary. It may not cover all possible information. If you have questions about this medicine, talk to your doctor, pharmacist, or health care provider.  2023 Elsevier/Gold Standard (2021-12-09 00:00:00)

## 2022-09-05 ENCOUNTER — Ambulatory Visit: Payer: Medicare Other | Attending: Cardiology

## 2022-09-05 ENCOUNTER — Other Ambulatory Visit: Payer: Self-pay

## 2022-09-05 DIAGNOSIS — T50915S Adverse effect of multiple unspecified drugs, medicaments and biological substances, sequela: Secondary | ICD-10-CM | POA: Diagnosis not present

## 2022-09-05 DIAGNOSIS — C787 Secondary malignant neoplasm of liver and intrahepatic bile duct: Secondary | ICD-10-CM

## 2022-09-05 DIAGNOSIS — Z0189 Encounter for other specified special examinations: Secondary | ICD-10-CM | POA: Diagnosis not present

## 2022-09-05 LAB — ECHOCARDIOGRAM COMPLETE
Area-P 1/2: 3.37 cm2
Calc EF: 54.7 %
S' Lateral: 2.8 cm
Single Plane A2C EF: 58.6 %
Single Plane A4C EF: 48.9 %

## 2022-09-09 ENCOUNTER — Telehealth: Payer: Self-pay

## 2022-09-09 NOTE — Telephone Encounter (Signed)
Pt reports she can see the ECHO results on MyChart, but doesn't understand what all the numbers mean.

## 2022-09-13 ENCOUNTER — Telehealth: Payer: Self-pay

## 2022-09-13 NOTE — Telephone Encounter (Signed)
Attempted to contact patient. No answer and no VM.  

## 2022-09-13 NOTE — Telephone Encounter (Signed)
-----  Message from Dellia Beckwith, MD sent at 09/11/2022  8:51 PM EST ----- Regarding: call Tell her labs look great -liver tests back to normal and hepatitis testing all negative. Do we need to send to her PCP?  ECHO looks great also

## 2022-09-16 ENCOUNTER — Telehealth: Payer: Self-pay

## 2022-09-16 NOTE — Telephone Encounter (Signed)
Attempted to contact patient. No answer and no VM.  

## 2022-09-16 NOTE — Telephone Encounter (Signed)
-----  Message from Derwood Kaplan, MD sent at 09/16/2022  6:56 AM EST ----- Regarding: call? Can't remember if you called already, her heart test looks good

## 2022-09-19 ENCOUNTER — Encounter: Payer: Self-pay | Admitting: Oncology

## 2022-09-23 ENCOUNTER — Inpatient Hospital Stay: Payer: Medicare Other | Attending: Oncology

## 2022-09-23 VITALS — BP 157/67 | HR 61 | Temp 97.9°F | Resp 12 | Ht 60.0 in | Wt 139.0 lb

## 2022-09-23 DIAGNOSIS — Z79899 Other long term (current) drug therapy: Secondary | ICD-10-CM | POA: Diagnosis not present

## 2022-09-23 DIAGNOSIS — Z79620 Long term (current) use of immunosuppressive biologic: Secondary | ICD-10-CM | POA: Diagnosis not present

## 2022-09-23 DIAGNOSIS — Z5112 Encounter for antineoplastic immunotherapy: Secondary | ICD-10-CM | POA: Insufficient documentation

## 2022-09-23 DIAGNOSIS — C787 Secondary malignant neoplasm of liver and intrahepatic bile duct: Secondary | ICD-10-CM | POA: Insufficient documentation

## 2022-09-23 DIAGNOSIS — C50912 Malignant neoplasm of unspecified site of left female breast: Secondary | ICD-10-CM | POA: Insufficient documentation

## 2022-09-23 DIAGNOSIS — Z170 Estrogen receptor positive status [ER+]: Secondary | ICD-10-CM | POA: Diagnosis not present

## 2022-09-23 MED ORDER — SODIUM CHLORIDE 0.9 % IV SOLN: Freq: Once | INTRAVENOUS | Status: AC

## 2022-09-23 MED ORDER — TRASTUZUMAB-ANNS CHEMO 420 MG IV SOLR
6.0000 mg/kg | Freq: Once | INTRAVENOUS | Status: AC
  Administered 2022-09-23: 378 mg via INTRAVENOUS
  Filled 2022-09-23: qty 18

## 2022-09-23 NOTE — Patient Instructions (Signed)
[facility]  Discharge Instructions: Thank you for choosing [facility] to provide your oncology and hematology care.  If you have a lab appointment with the [facility], please go directly to the [facility] and check in at the registration area.   Wear comfortable clothing and clothing appropriate for easy access to any Portacath or PICC line.   We strive to give you quality time with your provider. You may need to reschedule your appointment if you arrive late (15 or more minutes).  Arriving late affects you and other patients whose appointments are after yours.  Also, if you miss three or more appointments without notifying the office, you may be dismissed from the clinic at the provider's discretion.      For prescription refill requests, have your pharmacy contact our office and allow 72 hours for refills to be completed.    Today you received the following chemotherapy and/or immunotherapy agents Beryle Flock      To help prevent nausea and vomiting after your treatment, we encourage you to take your nausea medication as directed.  BELOW ARE SYMPTOMS THAT SHOULD BE REPORTED IMMEDIATELY: *FEVER GREATER THAN 100.4 F (38 C) OR HIGHER *CHILLS OR SWEATING *NAUSEA AND VOMITING THAT IS NOT CONTROLLED WITH YOUR NAUSEA MEDICATION *UNUSUAL SHORTNESS OF BREATH *UNUSUAL BRUISING OR BLEEDING *URINARY PROBLEMS (pain or burning when urinating, or frequent urination) *BOWEL PROBLEMS (unusual diarrhea, constipation, pain near the anus) TENDERNESS IN MOUTH AND THROAT WITH OR WITHOUT PRESENCE OF ULCERS (sore throat, sores in mouth, or a toothache) UNUSUAL RASH, SWELLING OR PAIN  UNUSUAL VAGINAL DISCHARGE OR ITCHING   Items with * indicate a potential emergency and should be followed up as soon as possible or go to the Emergency Department if any problems should occur.  Please show the CHEMOTHERAPY ALERT CARD or IMMUNOTHERAPY ALERT CARD at check-in to the  Emergency Department and triage nurse.  Should you have questions after your visit or need to cancel or reschedule your appointment, please contact [facility]  Dept: [phone]  and follow the prompts.  Office hours are 8:00 a.m. to 4:30 p.m. Monday - Friday. Please note that voicemails left after 4:00 p.m. may not be returned until the following business day.  We are closed weekends and major holidays. You have access to a nurse at all times for urgent questions. Please call the main number to the clinic Dept: [phone] and follow the prompts.  For any non-urgent questions, you may also contact your provider using MyChart. We now offer e-Visits for anyone 15 and older to request care online for non-urgent symptoms. For details visit [email or web address].   Also download the MyChart app! Go to the app store, search "MyChart", open the app, select [facility], and log in with your MyChart username and password.

## 2022-09-26 ENCOUNTER — Ambulatory Visit (INDEPENDENT_AMBULATORY_CARE_PROVIDER_SITE_OTHER): Payer: Medicare Other

## 2022-09-26 ENCOUNTER — Ambulatory Visit (INDEPENDENT_AMBULATORY_CARE_PROVIDER_SITE_OTHER): Payer: Medicare Other | Admitting: Physician Assistant

## 2022-09-26 ENCOUNTER — Encounter: Payer: Self-pay | Admitting: Physician Assistant

## 2022-09-26 VITALS — Ht 60.0 in | Wt 139.0 lb

## 2022-09-26 DIAGNOSIS — R102 Pelvic and perineal pain: Secondary | ICD-10-CM

## 2022-09-26 DIAGNOSIS — R0781 Pleurodynia: Secondary | ICD-10-CM

## 2022-09-26 HISTORY — DX: Pleurodynia: R07.81

## 2022-09-26 NOTE — Progress Notes (Signed)
Office Visit Note   Patient: Karen Allison           Date of Birth: [birth year]           MRN: [id number] Visit Date: 09/26/2022              Requested by: Karen Kiel, MD [address] [place],  [place] [address] PCP: Karen Kiel, MD   Assessment & Plan: Visit Diagnoses:  1. Rib pain on left side   2. Pain in pelvis     Plan: Karen Allison is a 77 year old woman with multiple medical comorbidities including metastatic breast cancer.  She is a former patient of Karen Allison and has had several surgeries.  She is 3 days status post falling onto the side of a box heater on her left side.  Denies any loss of consciousness shortness of breath or chest pain.  She noticed she had pain and bruising in her upper left axilla into her ribs.  Also has bruising in her posterior left buttock.  X-rays she has quite a bit of degenerative changes has had surgery in her back.  Cannot appreciate any acute fractures.  She does have some increased vascular markings in her chest but when compared to previous x-rays I do not think this is changed.  I did review the x-rays with Karen Allison.  She does have a appointment to see Karen Allison next week about her severely arthritic left shoulder.  I did listen to her lungs today per her request and they were clear.  She says she is coughing up a small amount of yellow phlegm.  I have encouraged her and been very frank with her and her husband that she has anything like this given her comorbidities she needs to be evaluated by her primary care doctor.  If she has any increasing shortness of breath or chest pain she is to go to the emergency room.  Follow-Up Instructions: 1 week with Karen Allison  Orders:  Orders Placed This Encounter  Procedures   XR Pelvis 1-2 Views   XR Ribs Unilateral Left   No orders of the defined types were placed in this encounter.     Procedures: No procedures performed   Clinical Data: No additional findings.   Subjective: Chief  Complaint  Patient presents with   Pelvis - Pain    Fall 09/22/2022   left ribs    Fall 09/22/2022    HPI the patient is a pleasant 77 year old woman with a history of multiple medical and orthopedic issues.  She is a previous patient of Karen Allison.  She does have an appointment to see Karen Allison next week to discuss issues she has with her shoulder she has severe avascular necrosis.  She comes in today because she had a fall a few days ago.  She fell onto a box heater on her left side.  Denies any chest pain or shortness of breath or loss of consciousness.  Denies any nausea or headache.  She is complaining of left-sided rib pain that comes around into her armpit.  Also has significant bruise bruising on her left posterior buttock.  Review of Systems  All other systems reviewed and are negative.   Objective: Vital Signs: Ht 5' (1.524 m)   Wt 139 lb (63 kg)   BMI 27.15 kg/m   Physical Exam Constitutional:      Appearance: Normal appearance.  Pulmonary:     Effort: Pulmonary effort is normal.  Skin:  General: Skin is warm and dry.  Neurological:     General: No focal deficit present.     Mental Status: She is alert.   Ortho Exam Examination of her left hip she she does have significant ecchymosis which is resolving on her left posterior buttock.  No tenderness to the spine to palpation.  No pain in the groin she actually has fairly good motion of the left hip without any reproducible pain.  She has regular sensation.  Strength is 5 out of 5 with resisted dorsiflexion plantarflexion of her ankle extension and flexion of her legs. Examination of her left side.  She does have limited motion of her left shoulder but this is baseline.  She has some mild tenderness to palpation of her left posterior upper rib cage.  She does have some resolving ecchymosis over her axilla.  She is neurovascular intact and overall has good strength with regards to grip strength.  Shoulder strength was not tested  secondary to her severe arthritis and inability to lift her arm sensation was intact pulses are intact                               .  Cannot palpate any crepitus resolving ecchymosis in her axilla.  She cannot take a deep breath without difficulty.  She has no fever chills or shortness of breath. Specialty Comments:  No specialty comments available.  Imaging: No results found.   PMFS History: Patient Active Problem List   Diagnosis Date Noted   Hyponatremia 12/02/2021    Class: Chronic   Osteoporosis 11/04/2020    Class: Chronic   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[place]) 06/05/2020   Malignant neoplasm of left breast ([place]) 06/05/2020   Other forms of scoliosis, thoracolumbar region 11/03/2016    Class: Chronic   Gait disorder 07/05/2016   HCAP (healthcare-associated pneumonia) 05/09/2014   Acute respiratory failure with hypoxia ([facility]) 05/09/2014   Pneumonia 05/09/2014   Hypertension    Hypothyroidism    Cervical stenosis (uterine cervix) 05/07/2014   Spinal stenosis in cervical region 05/06/2014    Class: Chronic   Cervical spondylosis without myelopathy 05/06/2014   HNP (herniated nucleus pulposus), cervical 05/06/2014   Adult hypothyroidism 05/03/2013   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   Past Medical History:  Diagnosis Date   Arthritis    Blood dyscrasia    bleeds and bruises easily   Breast cancer ([place])    left   Gait disorder 07/05/2016   Hepatitis     Hep A years ago   Hypertension    Hypothyroidism    Graves disease, age 53 yo   Nose trouble    right nostril will hemorrhage at times   Pneumonia    Rosacea    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[facility])     Family History  Problem Relation Age of Onset   Congestive Heart Failure Mother    Heart disease Father    Tongue cancer Grandchild     Past Surgical History:  Procedure Laterality Date   ABDOMINAL HYSTERECTOMY     Age 13   ANTERIOR CERVICAL  DECOMP/DISCECTOMY FUSION N/A 05/06/2014   Procedure: ANTERIOR CERVICAL DISCECTOMY FUSION C3-4 with transgraft, local bone graft, plate and screws;  Surgeon: Jessy Oto, MD;  Location: [facility];  Service: Orthopedics;  Laterality: N/A;   APPENDECTOMY     BREAST LUMPECTOMY Left    COLONOSCOPY  EYE SURGERY Bilateral    cataracts   LUMBAR SPINE SURGERY  2011   Removal of hematoma   ORIF HUMERUS FRACTURE Left 2013   THORACIC SPINE SURGERY  2011   Bone cages   THYROIDECTOMY     TONSILLECTOMY     Social History   Occupational History   Occupation: Retired  Tobacco Use   Smoking status: Former    Types: Cigarettes    Quit date: 04/23/1962    Years since quitting: 60.4   Smokeless tobacco: Never   Tobacco comments:    as teenager  Substance and Sexual Activity   Alcohol use: Yes    Comment: Social   Drug use: No   Sexual activity: Not on file

## 2022-10-02 ENCOUNTER — Ambulatory Visit (HOSPITAL_COMMUNITY): Payer: Medicare Other

## 2022-10-04 DIAGNOSIS — L03011 Cellulitis of right finger: Secondary | ICD-10-CM | POA: Diagnosis not present

## 2022-10-04 DIAGNOSIS — R0781 Pleurodynia: Secondary | ICD-10-CM | POA: Diagnosis not present

## 2022-10-04 DIAGNOSIS — R339 Retention of urine, unspecified: Secondary | ICD-10-CM | POA: Diagnosis not present

## 2022-10-04 DIAGNOSIS — Z6824 Body mass index (BMI) 24.0-24.9, adult: Secondary | ICD-10-CM | POA: Diagnosis not present

## 2022-10-05 ENCOUNTER — Ambulatory Visit: Payer: Self-pay

## 2022-10-05 ENCOUNTER — Encounter: Payer: Self-pay | Admitting: Orthopedic Surgery

## 2022-10-05 ENCOUNTER — Ambulatory Visit (INDEPENDENT_AMBULATORY_CARE_PROVIDER_SITE_OTHER): Payer: Medicare Other

## 2022-10-05 ENCOUNTER — Ambulatory Visit (INDEPENDENT_AMBULATORY_CARE_PROVIDER_SITE_OTHER): Payer: Medicare Other | Admitting: Orthopedic Surgery

## 2022-10-05 DIAGNOSIS — M25512 Pain in left shoulder: Secondary | ICD-10-CM | POA: Diagnosis not present

## 2022-10-05 DIAGNOSIS — M546 Pain in thoracic spine: Secondary | ICD-10-CM | POA: Diagnosis not present

## 2022-10-05 DIAGNOSIS — M19112 Post-traumatic osteoarthritis, left shoulder: Secondary | ICD-10-CM

## 2022-10-05 MED ORDER — METHYLPREDNISOLONE ACETATE 40 MG/ML IJ SUSP
40.0000 mg | INTRAMUSCULAR | Status: AC | PRN
  Administered 2022-10-05: 40 mg via INTRA_ARTICULAR

## 2022-10-05 MED ORDER — LIDOCAINE HCL 1 % IJ SOLN
5.0000 mL | INTRAMUSCULAR | Status: AC | PRN
  Administered 2022-10-05: 5 mL

## 2022-10-05 MED ORDER — BUPIVACAINE HCL 0.5 % IJ SOLN
9.0000 mL | INTRAMUSCULAR | Status: AC | PRN
  Administered 2022-10-05: 9 mL via INTRA_ARTICULAR

## 2022-10-05 NOTE — Progress Notes (Signed)
Office Visit Note   Patient: Karen Allison           Date of Birth: 1946/03/31           MRN: [id number] Visit Date: 10/05/2022 Requested by: [name], MD [address] [place],  [place] [address] PCP: [name], MD  Subjective: Chief Complaint  Patient presents with   Left Shoulder - Pain    HPI: Karen Allison is a 77 y.o. female who presents to the office reporting left shoulder pain.  She describes pain in the left shoulder is constant and severe.  The pain wakes her from sleep at night.  She reports decreased range of motion.  Prior left shoulder fracture 3 to 4 years ago.  Describes neck pain as well and she does describe having multiple falls backwards.  Has a history of neck surgery with Dr. Donavan Burnet as well as lumbar fusion done at [facility] with subsequent nerve damage.  Takes Norco at bedtime.  Is also on chemotherapy every 3 weeks due to history of breast cancer.  Prior radiographs are reviewed.  She does have osteopenia in the shoulder along with malunited proximal humerus fracture with severe arthritis present.  Shoulder is located.  She also has cervical spine and thoracic spine degenerative disc disease with no obvious compression fractures..                ROS: All systems reviewed are negative as they relate to the chief complaint within the history of present illness.  Patient denies fevers or chills.  Assessment & Plan: Visit Diagnoses:  1. Left shoulder pain, unspecified chronicity   2. Pain in thoracic spine   3. Post-traumatic osteoarthritis of left shoulder     Plan: Impression is left shoulder pain with 6 diminished range of motion and significant pain.  She also has thoracic spine pain which requires further diagnostic evaluation due to her history of cancer.  Plan at this time is an intra-articular glenohumeral joint injection on the left-hand side with ultrasound guidance and MRI of the thoracic spine to evaluate focal pain in the T7-T8  region.  Follow-Up Instructions: No follow-ups on file.   Orders:  Orders Placed This Encounter  Procedures   XR Shoulder Left   US Guided Needle Placement - No Linked Charges   MR Thoracic Spine w/o contrast   No orders of the defined types were placed in this encounter.     Procedures: Large Joint Inj: L glenohumeral on 10/05/2022 10:18 PM Indications: diagnostic evaluation and pain Details: 18 G 1.5 in needle, ultrasound-guided posterior approach  Arthrogram: No  Medications: 9 mL bupivacaine 0.5 %; 40 mg methylPREDNISolone acetate 40 MG/ML; 5 mL lidocaine 1 % Outcome: tolerated well, no immediate complications Procedure, treatment alternatives, risks and benefits explained, specific risks discussed. Consent was given by the patient. Immediately prior to procedure a time out was called to verify the correct patient, procedure, equipment, support staff and site/side marked as required. Patient was prepped and draped in the usual sterile fashion.       Clinical Data: No additional findings.  Objective: Vital Signs: There were no vitals taken for this visit.  Physical Exam:  Constitutional: Patient appears well-developed HEENT:  Head: Normocephalic Eyes:EOM are normal Neck: Normal range of motion Cardiovascular: Normal rate Pulmonary/chest: Effort normal Neurologic: Patient is alert Skin: Skin is warm Psychiatric: Patient has normal mood and affect  Ortho Exam: Ortho exam demonstrates pretty reasonable cervical spine range of motion with  flexion extension rotation.  5 out of 5 grip EPL FPL interosseous wrist flexion extension bicep triceps and deltoid strength.  She has limited range of motion on the left of 20/45/80.  Deltoid is functional.  Coarse grinding is present with passive range of motion of the left shoulder.  No definite paresthesias C5-T1.  Specialty Comments:  No specialty comments available.  Imaging: No results found.   PMFS History: Patient  Active Problem List   Diagnosis Date Noted   Rib pain on left side 09/26/2022   Hyponatremia 12/02/2021    Class: Chronic   Osteoporosis 11/04/2020    Class: Chronic   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[place]) 06/05/2020   Malignant neoplasm of left breast ([place]) 06/05/2020   Other forms of scoliosis, thoracolumbar region 11/03/2016    Class: Chronic   Gait disorder 07/05/2016   HCAP (healthcare-associated pneumonia) 05/09/2014   Acute respiratory failure with hypoxia ([place]) 05/09/2014   Pneumonia 05/09/2014   Hypertension    Hypothyroidism    Cervical stenosis (uterine cervix) 05/07/2014   Spinal stenosis in cervical region 05/06/2014    Class: Chronic   Cervical spondylosis without myelopathy 05/06/2014   HNP (herniated nucleus pulposus), cervical 05/06/2014   Adult hypothyroidism 05/03/2013   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   Past Medical History:  Diagnosis Date   Arthritis    Blood dyscrasia    bleeds and bruises easily   Breast cancer ([place])    left   Gait disorder 07/05/2016   Hepatitis     Hep A years ago   Hypertension    Hypothyroidism    Graves disease, age 33 yo   Nose trouble    right nostril will hemorrhage at times   Pneumonia    Rosacea    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[place])     Family History  Problem Relation Age of Onset   Congestive Heart Failure Mother    Heart disease Father    Tongue cancer Grandchild     Past Surgical History:  Procedure Laterality Date   ABDOMINAL HYSTERECTOMY     Age 29   ANTERIOR CERVICAL DECOMP/DISCECTOMY FUSION N/A 05/06/2014   Procedure: ANTERIOR CERVICAL DISCECTOMY FUSION C3-4 with transgraft, local bone graft, plate and screws;  Surgeon: Jessy Oto, MD;  Location: [facility];  Service: Orthopedics;  Laterality: N/A;   APPENDECTOMY     BREAST LUMPECTOMY Left    COLONOSCOPY     EYE SURGERY Bilateral    cataracts   LUMBAR SPINE SURGERY  2011   Removal of hematoma   ORIF HUMERUS FRACTURE Left  2013   THORACIC SPINE SURGERY  2011   Bone cages   THYROIDECTOMY     TONSILLECTOMY     Social History   Occupational History   Occupation: Retired  Tobacco Use   Smoking status: Former    Types: Cigarettes    Quit date: 04/23/1962    Years since quitting: 60.4   Smokeless tobacco: Never   Tobacco comments:    as teenager  Substance and Sexual Activity   Alcohol use: Yes    Comment: Social   Drug use: No   Sexual activity: Not on file

## 2022-10-13 ENCOUNTER — Encounter: Payer: Self-pay | Admitting: Oncology

## 2022-10-13 ENCOUNTER — Other Ambulatory Visit (HOSPITAL_BASED_OUTPATIENT_CLINIC_OR_DEPARTMENT_OTHER): Payer: Self-pay

## 2022-10-14 ENCOUNTER — Inpatient Hospital Stay: Payer: Medicare Other

## 2022-10-14 VITALS — BP 152/64 | HR 64 | Temp 98.1°F | Resp 18 | Ht 60.0 in | Wt 133.1 lb

## 2022-10-14 DIAGNOSIS — C50912 Malignant neoplasm of unspecified site of left female breast: Secondary | ICD-10-CM | POA: Diagnosis not present

## 2022-10-14 DIAGNOSIS — Z170 Estrogen receptor positive status [ER+]: Secondary | ICD-10-CM | POA: Diagnosis not present

## 2022-10-14 DIAGNOSIS — Z5112 Encounter for antineoplastic immunotherapy: Secondary | ICD-10-CM | POA: Diagnosis not present

## 2022-10-14 DIAGNOSIS — Z79899 Other long term (current) drug therapy: Secondary | ICD-10-CM | POA: Diagnosis not present

## 2022-10-14 DIAGNOSIS — Z79620 Long term (current) use of immunosuppressive biologic: Secondary | ICD-10-CM | POA: Diagnosis not present

## 2022-10-14 DIAGNOSIS — C787 Secondary malignant neoplasm of liver and intrahepatic bile duct: Secondary | ICD-10-CM | POA: Diagnosis not present

## 2022-10-14 MED ORDER — HEPARIN SOD (PORK) LOCK FLUSH 100 UNIT/ML IV SOLN: 500.0000 [IU] | Freq: Once | INTRAVENOUS | Status: DC | PRN

## 2022-10-14 MED ORDER — TRASTUZUMAB-ANNS CHEMO 420 MG IV SOLR
6.0000 mg/kg | Freq: Once | INTRAVENOUS | Status: AC
  Administered 2022-10-14: 378 mg via INTRAVENOUS
  Filled 2022-10-14: qty 18

## 2022-10-14 MED ORDER — SODIUM CHLORIDE 0.9% FLUSH: 10.0000 mL | INTRAVENOUS | Status: DC | PRN

## 2022-10-14 MED ORDER — SODIUM CHLORIDE 0.9 % IV SOLN: Freq: Once | INTRAVENOUS | Status: AC

## 2022-10-14 NOTE — Patient Instructions (Signed)
Trastuzumab Injection What is this medication? TRASTUZUMAB (tras TOO zoo mab) treats breast cancer and stomach cancer. It works by blocking a protein that causes cancer cells to grow and multiply. This helps to slow or stop the spread of cancer cells. This medicine may be used for other purposes; ask your health care provider or pharmacist if you have questions. COMMON BRAND NAME(S): Herceptin, Janae Bridgeman, Ontruzant, Trazimera What should I tell my care team before I take this medication? They need to know if you have any of these conditions: Heart failure Lung disease An unusual or allergic reaction to trastuzumab, other medications, foods, dyes, or preservatives Pregnant or trying to get pregnant Breast-feeding How should I use this medication? This medication is injected into a vein. It is given by your care team in a hospital or clinic setting. Talk to your care team about the use of this medication in children. It is not approved for use in children. Overdosage: If you think you have taken too much of this medicine contact a poison control center or emergency room at once. NOTE: This medicine is only for you. Do not share this medicine with others. What if I miss a dose? Keep appointments for follow-up doses. It is important not to miss your dose. Call your care team if you are unable to keep an appointment. What may interact with this medication? Certain types of chemotherapy, such as daunorubicin, doxorubicin, epirubicin, idarubicin This list may not describe all possible interactions. Give your health care provider a list of all the medicines, herbs, non-prescription drugs, or dietary supplements you use. Also tell them if you smoke, drink alcohol, or use illegal drugs. Some items may interact with your medicine. What should I watch for while using this medication? Your condition will be monitored carefully while you are receiving this medication. This medication may  make you feel generally unwell. This is not uncommon, as chemotherapy affects healthy cells as well as cancer cells. Report any side effects. Continue your course of treatment even though you feel ill unless your care team tells you to stop. This medication may increase your risk of getting an infection. Call your care team for advice if you get a fever, chills, sore throat, or other symptoms of a cold or flu. Do not treat yourself. Try to avoid being around people who are sick. Avoid taking medications that contain aspirin, acetaminophen, ibuprofen, naproxen, or ketoprofen unless instructed by your care team. These medications can hide a fever. Talk to your care team if you may be pregnant. Serious birth defects can occur if you take this medication during pregnancy and for 7 months after the last dose. You will need a negative pregnancy test before starting this medication. Contraception is recommended while taking this medication and for 7 months after the last dose. Your care team can help you find the option that works for you. Do not breastfeed while taking this medication and for 7 months after stopping treatment. What side effects may I notice from receiving this medication? Side effects that you should report to your care team as soon as possible: Allergic reactions or angioedema--skin rash, itching or hives, swelling of the face, eyes, lips, tongue, arms, or legs, trouble swallowing or breathing Dry cough, shortness of breath or trouble breathing Heart failure--shortness of breath, swelling of the ankles, feet, or hands, sudden weight gain, unusual weakness or fatigue Infection--fever, chills, cough, or sore throat Infusion reactions--chest pain, shortness of breath or trouble breathing, feeling faint or  lightheaded Side effects that usually do not require medical attention (report to your care team if they continue or are bothersome): Diarrhea Dizziness Headache Nausea Trouble  sleeping Vomiting This list may not describe all possible side effects. Call your doctor for medical advice about side effects. You may report side effects to FDA at 1-800-FDA-1088. Where should I keep my medication? This medication is given in a hospital or clinic. It will not be stored at home. NOTE: This sheet is a summary. It may not cover all possible information. If you have questions about this medicine, talk to your doctor, pharmacist, or health care provider.  2023 Elsevier/Gold Standard (2021-12-09 00:00:00)

## 2022-10-23 ENCOUNTER — Ambulatory Visit
Admission: RE | Admit: 2022-10-23 | Discharge: 2022-10-23 | Disposition: A | Discharge status: Home / Self Care | Payer: Medicare Other | Source: Ambulatory Visit | Attending: Orthopedic Surgery | Admitting: Orthopedic Surgery

## 2022-10-23 DIAGNOSIS — M47814 Spondylosis without myelopathy or radiculopathy, thoracic region: Secondary | ICD-10-CM | POA: Diagnosis not present

## 2022-10-23 DIAGNOSIS — M546 Pain in thoracic spine: Secondary | ICD-10-CM

## 2022-10-24 NOTE — Progress Notes (Signed)
Can you order cxr for this person thx

## 2022-10-26 ENCOUNTER — Other Ambulatory Visit: Payer: Self-pay

## 2022-10-26 ENCOUNTER — Telehealth: Payer: Self-pay

## 2022-10-26 ENCOUNTER — Ambulatory Visit: Payer: Medicare Other | Admitting: Orthopedic Surgery

## 2022-10-26 DIAGNOSIS — E785 Hyperlipidemia, unspecified: Secondary | ICD-10-CM | POA: Diagnosis not present

## 2022-10-26 DIAGNOSIS — Z6824 Body mass index (BMI) 24.0-24.9, adult: Secondary | ICD-10-CM | POA: Diagnosis not present

## 2022-10-26 DIAGNOSIS — M546 Pain in thoracic spine: Secondary | ICD-10-CM

## 2022-10-26 DIAGNOSIS — M479 Spondylosis, unspecified: Secondary | ICD-10-CM | POA: Diagnosis not present

## 2022-10-26 DIAGNOSIS — I10 Essential (primary) hypertension: Secondary | ICD-10-CM | POA: Diagnosis not present

## 2022-10-26 DIAGNOSIS — I700 Atherosclerosis of aorta: Secondary | ICD-10-CM | POA: Diagnosis not present

## 2022-10-26 DIAGNOSIS — Z0000 Encounter for general adult medical examination without abnormal findings: Secondary | ICD-10-CM | POA: Diagnosis not present

## 2022-10-26 DIAGNOSIS — E039 Hypothyroidism, unspecified: Secondary | ICD-10-CM | POA: Diagnosis not present

## 2022-10-26 DIAGNOSIS — C50912 Malignant neoplasm of unspecified site of left female breast: Secondary | ICD-10-CM | POA: Diagnosis not present

## 2022-10-26 DIAGNOSIS — K219 Gastro-esophageal reflux disease without esophagitis: Secondary | ICD-10-CM | POA: Diagnosis not present

## 2022-10-26 DIAGNOSIS — Z1331 Encounter for screening for depression: Secondary | ICD-10-CM | POA: Diagnosis not present

## 2022-10-26 DIAGNOSIS — C787 Secondary malignant neoplasm of liver and intrahepatic bile duct: Secondary | ICD-10-CM | POA: Diagnosis not present

## 2022-10-26 NOTE — Telephone Encounter (Signed)
-----   Message from Meredith Pel, MD sent at 10/24/2022 12:25 PM EST ----- Can you order cxr for this person thx

## 2022-10-26 NOTE — Telephone Encounter (Signed)
Wanted you to be aware this chest xray has been ordered for [facility].

## 2022-10-26 NOTE — Telephone Encounter (Signed)
Pt aware can go as walk in to [facility] any time.

## 2022-11-02 ENCOUNTER — Encounter: Payer: Self-pay | Admitting: Orthopedic Surgery

## 2022-11-02 ENCOUNTER — Ambulatory Visit (INDEPENDENT_AMBULATORY_CARE_PROVIDER_SITE_OTHER): Payer: Medicare Other | Admitting: Orthopedic Surgery

## 2022-11-02 DIAGNOSIS — M546 Pain in thoracic spine: Secondary | ICD-10-CM

## 2022-11-02 NOTE — Progress Notes (Signed)
Office Visit Note   Patient: Karen Allison           Date of Birth: [birth year]           MRN: [id number] Visit Date: 11/02/2022 Requested by: [name], MD [address] [place],  [place] [address] PCP: [name], MD  Subjective: Chief Complaint  Patient presents with   Other     Scan review    HPI: Karen Allison is a 77 y.o. female who presents to the office reporting thoracic spine pain.  Since she was last seen she has had a MRI of the thoracic spine.  That shows no acute osseous abnormality.  No metastatic disease.  She did have some patchy airspace opacity within the right mid to lower lung field not well assessed by MRI scan.  Her left shoulder glenohumeral injection helped for about 3 weeks.  Does report some continued scapular pain.  She does take Norco for symptoms.  She does have posttraumatic left shoulder arthritis and deformity..                ROS: All systems reviewed are negative as they relate to the chief complaint within the history of present illness.  Patient denies fevers or chills.  Assessment & Plan: Visit Diagnoses:  1. Pain in thoracic spine     Plan: Impression is no metastatic disease in the thoracic spine.  Does have some airspace disease.  This is not well assessed on MRI scan.  No indication for further workup at this time.  She may consider her options regarding the left shoulder but replacement is really her only predictable pain relieving intervention for a long duration.  Follow-up as needed.  Follow-Up Instructions: No follow-ups on file.   Orders:  No orders of the defined types were placed in this encounter.  No orders of the defined types were placed in this encounter.     Procedures: No procedures performed   Clinical Data: No additional findings.  Objective: Vital Signs: There were no vitals taken for this visit.  Physical Exam:  Constitutional: Patient appears well-developed HEENT:  Head:  Normocephalic Eyes:EOM are normal Neck: Normal range of motion Cardiovascular: Normal rate Pulmonary/chest: Effort normal Neurologic: Patient is alert Skin: Skin is warm Psychiatric: Patient has normal mood and affect  Ortho Exam: Ortho exam demonstrates diminished left shoulder range of motion consistent with her known arthritis and posttraumatic deformity.  Deltoid does fire.  Motor or sensory function of both hands intact.  No masses lymphadenopathy or skin changes noted in that shoulder girdle region.  Negative clonus negative Babinski in the lower extremities.  Specialty Comments:  No specialty comments available.  Imaging: No results found.   PMFS History: Patient Active Problem List   Diagnosis Date Noted   Rib pain on left side 09/26/2022   Hyponatremia 12/02/2021    Class: Chronic   Osteoporosis 11/04/2020    Class: Chronic   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[place] [facility]) 06/05/2020   Malignant neoplasm of left breast ([facility]) 06/05/2020   Other forms of scoliosis, thoracolumbar region 11/03/2016    Class: Chronic   Gait disorder 07/05/2016   HCAP (healthcare-associated pneumonia) 05/09/2014   Acute respiratory failure with hypoxia ([facility]) 05/09/2014   Pneumonia 05/09/2014   Hypertension    Hypothyroidism    Cervical stenosis (uterine cervix) 05/07/2014   Spinal stenosis in cervical region 05/06/2014    Class: Chronic   Cervical spondylosis without myelopathy 05/06/2014   HNP (herniated  nucleus pulposus), cervical 05/06/2014   Adult hypothyroidism 05/03/2013   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   Past Medical History:  Diagnosis Date   Arthritis    Blood dyscrasia    bleeds and bruises easily   Breast cancer ([facility])    left   Gait disorder 07/05/2016   Hepatitis     Hep A years ago   Hypertension    Hypothyroidism    Graves disease, age 22 yo   Nose trouble    right nostril will hemorrhage at times   Pneumonia    Rosacea    Secondary malignant  neoplasm of liver ([facility])     Family History  Problem Relation Age of Onset   Congestive Heart Failure Mother    Heart disease Father    Tongue cancer Grandchild     Past Surgical History:  Procedure Laterality Date   ABDOMINAL HYSTERECTOMY     Age 77   ANTERIOR CERVICAL DECOMP/DISCECTOMY FUSION N/A 05/06/2014   Procedure: ANTERIOR CERVICAL DISCECTOMY FUSION C3-4 with transgraft, local bone graft, plate and screws;  Surgeon: Jessy Oto, MD;  Location: [facility];  Service: Orthopedics;  Laterality: N/A;   APPENDECTOMY     BREAST LUMPECTOMY Left    COLONOSCOPY     EYE SURGERY Bilateral    cataracts   LUMBAR SPINE SURGERY  2011   Removal of hematoma   ORIF HUMERUS FRACTURE Left 2013   THORACIC SPINE SURGERY  2011   Bone cages   THYROIDECTOMY     TONSILLECTOMY     Social History   Occupational History   Occupation: Retired  Tobacco Use   Smoking status: Former    Types: Cigarettes    Quit date: 04/23/1962    Years since quitting: 60.5   Smokeless tobacco: Never   Tobacco comments:    as teenager  Substance and Sexual Activity   Alcohol use: Yes    Comment: Social   Drug use: No   Sexual activity: Not on file

## 2022-11-04 ENCOUNTER — Inpatient Hospital Stay: Payer: Medicare Other | Attending: Oncology

## 2022-11-04 ENCOUNTER — Telehealth: Payer: Self-pay

## 2022-11-04 VITALS — BP 132/67 | HR 75 | Temp 98.4°F | Resp 18 | Wt 140.0 lb

## 2022-11-04 DIAGNOSIS — Z5112 Encounter for antineoplastic immunotherapy: Secondary | ICD-10-CM | POA: Insufficient documentation

## 2022-11-04 DIAGNOSIS — M810 Age-related osteoporosis without current pathological fracture: Secondary | ICD-10-CM | POA: Diagnosis not present

## 2022-11-04 DIAGNOSIS — Z170 Estrogen receptor positive status [ER+]: Secondary | ICD-10-CM | POA: Insufficient documentation

## 2022-11-04 DIAGNOSIS — C50912 Malignant neoplasm of unspecified site of left female breast: Secondary | ICD-10-CM | POA: Diagnosis not present

## 2022-11-04 DIAGNOSIS — M549 Dorsalgia, unspecified: Secondary | ICD-10-CM | POA: Insufficient documentation

## 2022-11-04 DIAGNOSIS — E871 Hypo-osmolality and hyponatremia: Secondary | ICD-10-CM | POA: Insufficient documentation

## 2022-11-04 DIAGNOSIS — Z79899 Other long term (current) drug therapy: Secondary | ICD-10-CM | POA: Diagnosis not present

## 2022-11-04 DIAGNOSIS — C787 Secondary malignant neoplasm of liver and intrahepatic bile duct: Secondary | ICD-10-CM | POA: Diagnosis not present

## 2022-11-04 DIAGNOSIS — F32A Depression, unspecified: Secondary | ICD-10-CM | POA: Insufficient documentation

## 2022-11-04 MED ORDER — SODIUM CHLORIDE 0.9% FLUSH: 10.0000 mL | INTRAVENOUS | Status: DC | PRN

## 2022-11-04 MED ORDER — TRASTUZUMAB-ANNS CHEMO 150 MG IV SOLR
6.0000 mg/kg | Freq: Once | INTRAVENOUS | Status: AC
  Administered 2022-11-04: 378 mg via INTRAVENOUS
  Filled 2022-11-04: qty 18

## 2022-11-04 MED ORDER — SODIUM CHLORIDE 0.9 % IV SOLN: Freq: Once | INTRAVENOUS | Status: AC

## 2022-11-04 NOTE — Telephone Encounter (Signed)
-----   Message from Meredith Pel, MD sent at 11/04/2022  7:05 AM EDT ----- Corrin Parker.  Can you get a chest x-ray order on [facility] or at least have her follow-up with her primary care provider for this finding of patchy airspace disease on thoracic MRI.  Thanks

## 2022-11-04 NOTE — Telephone Encounter (Signed)
Can you please check on this order?

## 2022-11-07 ENCOUNTER — Encounter: Payer: Self-pay | Admitting: *Deleted

## 2022-11-07 NOTE — Telephone Encounter (Signed)
Sent my chart note to pt letting her know xray order has been placed and can go as a walk in to [facility]

## 2022-11-20 IMAGING — MG MM DIGITAL DIAGNOSTIC UNILAT*R* W/ TOMO W/ CAD
6 of 10 series · 6 of 30 positions shown · non-contrast
Comparison: Previous exam(s).

CLINICAL DATA: Patient returns today to evaluate a possible RIGHT
breast mass questioned on recent screening mammogram.

EXAM:
DIGITAL DIAGNOSTIC UNILATERAL RIGHT MAMMOGRAM WITH TOMOSYNTHESIS AND
CAD; ULTRASOUND RIGHT BREAST LIMITED
TECHNIQUE: Right digital diagnostic mammography and breast tomosynthesis was
performed. The images were evaluated with computer-aided detection.;
Targeted ultrasound examination of the right breast was performed

[R MLO synth-2D (1 of 2)]
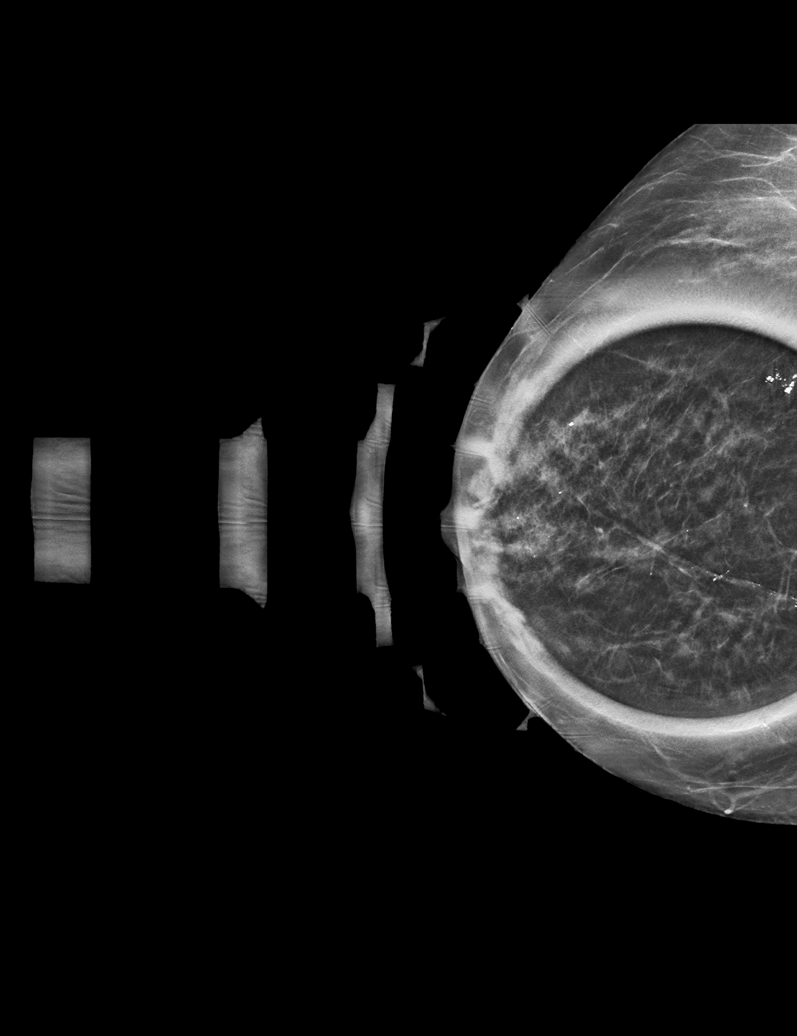

[R MLO synth-2D (2 of 2)]
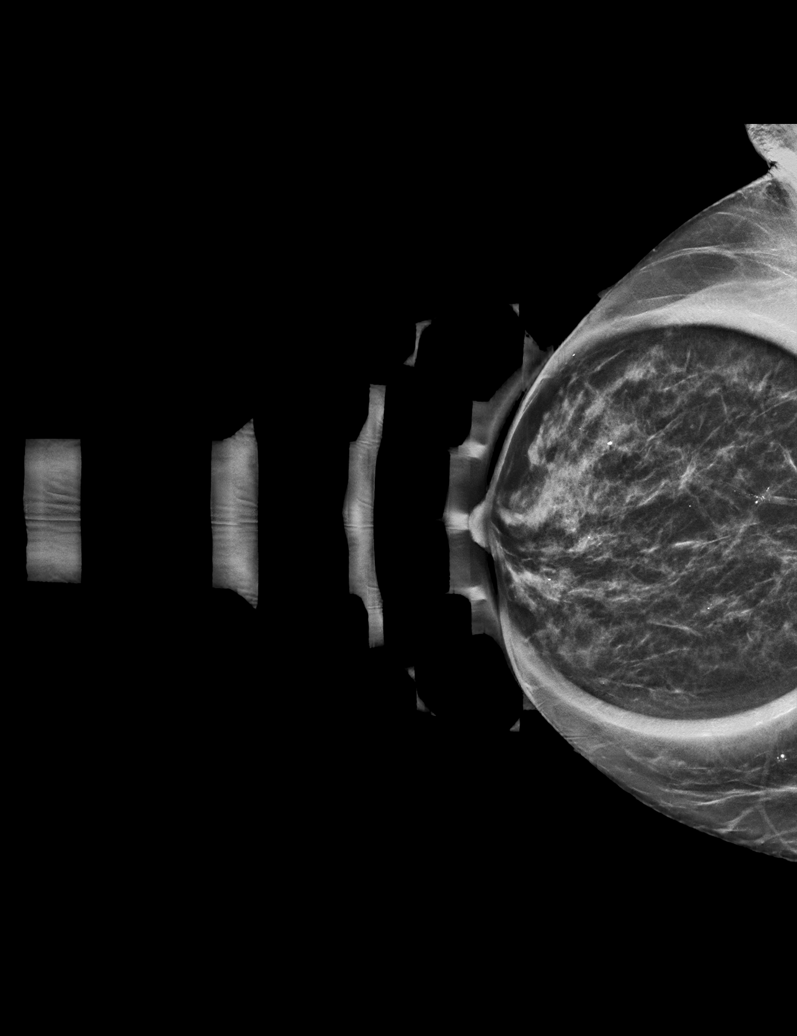

[R CC synth-2D (1 of 2)]
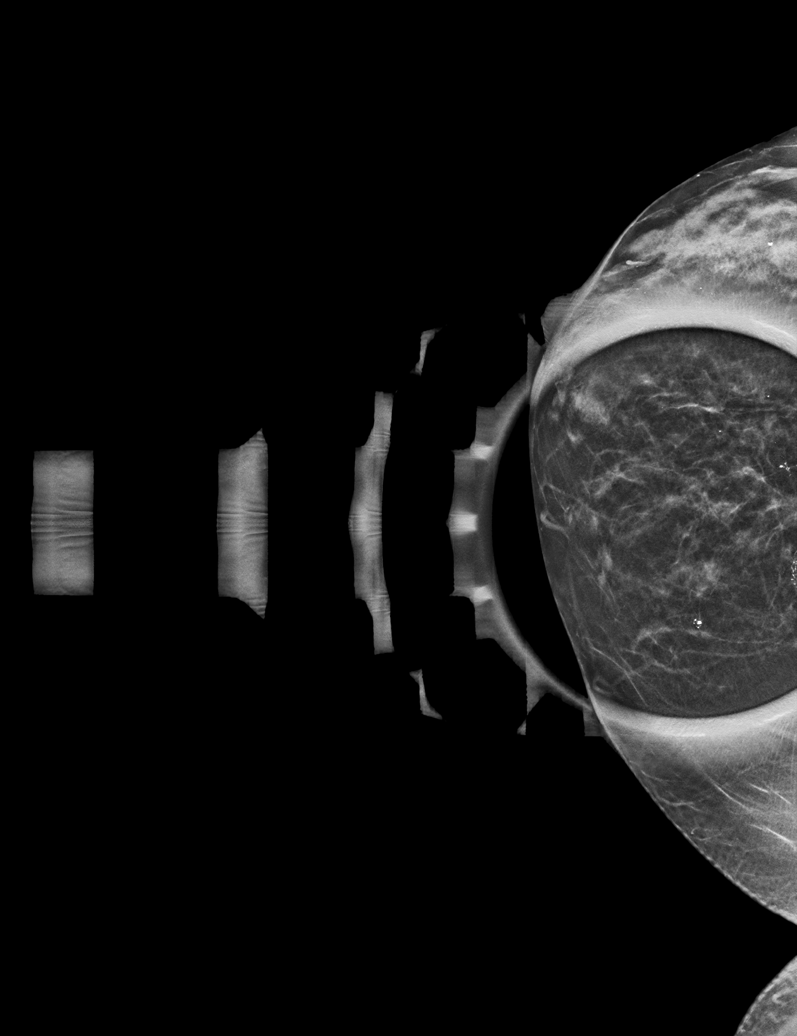

[R CC synth-2D (2 of 2)]
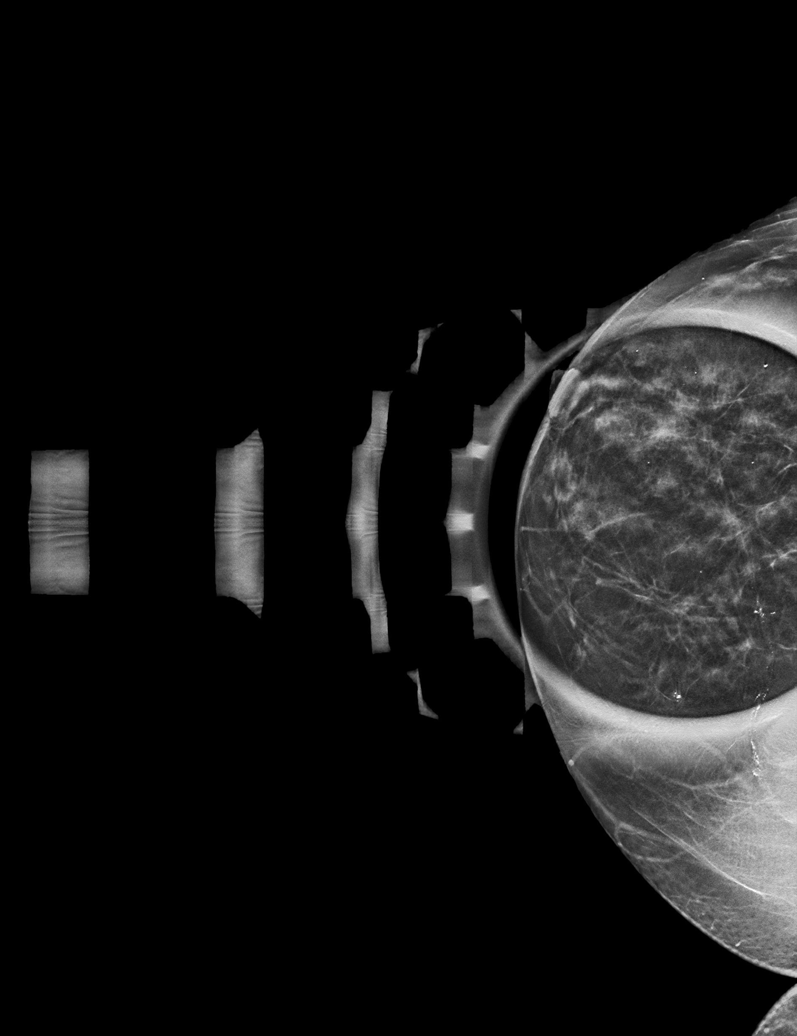

[R ML synth-2D]
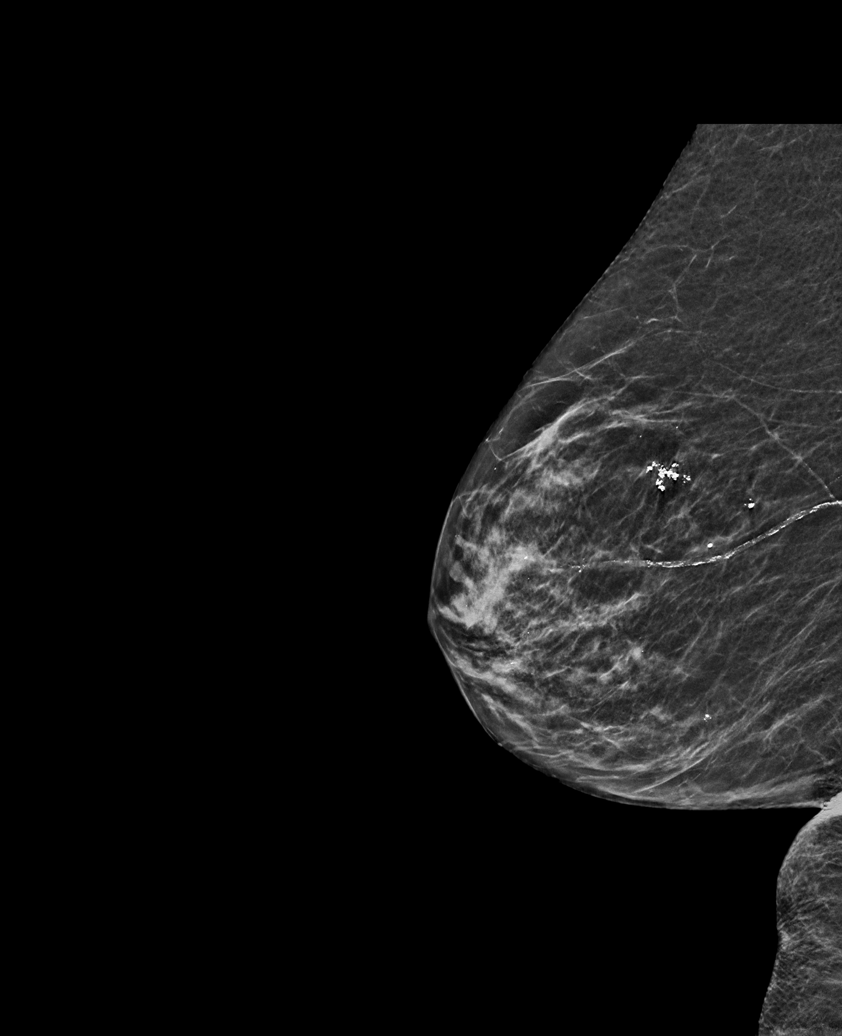

[R CC tomo · tomo slice 19/38.0]
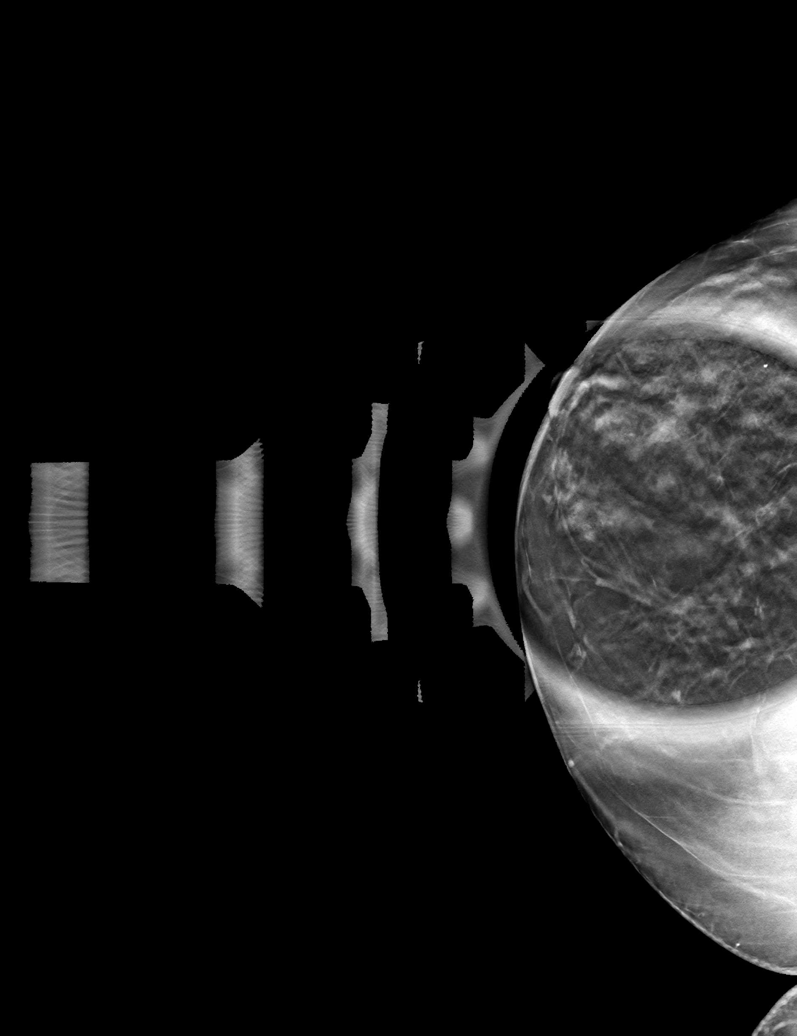

[6 of 30 positions shown; findings below may reference images not displayed]

ACR Breast Density Category c: The breast tissue is heterogeneously
dense, which may obscure small masses.
FINDINGS: On today's additional diagnostic views, including spot compression
with 3D tomosynthesis, the questioned mass within the inner RIGHT
breast is less conspicuous suggesting superimposition of normal
dense fibroglandular tissues. Ultrasound will be performed for
further characterization.

Targeted ultrasound is performed, evaluating the inner RIGHT breast,
showing only normal fibroglandular tissues and fat lobules
throughout. There is no solid or cystic mass.
IMPRESSION: No evidence of malignancy.

Patient may return to routine annual bilateral screening mammogram
schedule.

RECOMMENDATION:
Screening mammogram in one year.(Code:99-L-7K0)

I have discussed the findings and recommendations with the patient.
If applicable, a reminder letter will be sent to the patient
regarding the next appointment.

BI-RADS CATEGORY  1: Negative.

## 2022-11-24 MED FILL — Trastuzumab-anns For IV Soln 150 MG: INTRAVENOUS | Qty: 18 | Status: AC

## 2022-11-25 ENCOUNTER — Inpatient Hospital Stay: Payer: Medicare Other | Attending: Oncology

## 2022-11-25 VITALS — BP 133/70 | HR 76 | Temp 98.1°F | Resp 18 | Ht 60.0 in | Wt 135.0 lb

## 2022-11-25 DIAGNOSIS — M549 Dorsalgia, unspecified: Secondary | ICD-10-CM | POA: Diagnosis not present

## 2022-11-25 DIAGNOSIS — Z5112 Encounter for antineoplastic immunotherapy: Secondary | ICD-10-CM | POA: Diagnosis not present

## 2022-11-25 DIAGNOSIS — C50912 Malignant neoplasm of unspecified site of left female breast: Secondary | ICD-10-CM

## 2022-11-25 DIAGNOSIS — Z79899 Other long term (current) drug therapy: Secondary | ICD-10-CM | POA: Diagnosis not present

## 2022-11-25 DIAGNOSIS — C787 Secondary malignant neoplasm of liver and intrahepatic bile duct: Secondary | ICD-10-CM | POA: Insufficient documentation

## 2022-11-25 DIAGNOSIS — F32A Depression, unspecified: Secondary | ICD-10-CM | POA: Insufficient documentation

## 2022-11-25 DIAGNOSIS — F419 Anxiety disorder, unspecified: Secondary | ICD-10-CM | POA: Insufficient documentation

## 2022-11-25 DIAGNOSIS — E871 Hypo-osmolality and hyponatremia: Secondary | ICD-10-CM | POA: Diagnosis not present

## 2022-11-25 DIAGNOSIS — Z79620 Long term (current) use of immunosuppressive biologic: Secondary | ICD-10-CM | POA: Insufficient documentation

## 2022-11-25 DIAGNOSIS — M810 Age-related osteoporosis without current pathological fracture: Secondary | ICD-10-CM | POA: Diagnosis not present

## 2022-11-25 DIAGNOSIS — Z170 Estrogen receptor positive status [ER+]: Secondary | ICD-10-CM | POA: Diagnosis not present

## 2022-11-25 MED ORDER — SODIUM CHLORIDE 0.9 % IV SOLN: Freq: Once | INTRAVENOUS | Status: AC

## 2022-11-25 MED ORDER — TRASTUZUMAB-ANNS CHEMO 150 MG IV SOLR
6.0000 mg/kg | Freq: Once | INTRAVENOUS | Status: AC
  Administered 2022-11-25: 378 mg via INTRAVENOUS
  Filled 2022-11-25: qty 18

## 2022-11-25 NOTE — Patient Instructions (Signed)
Trastuzumab Injection What is this medication? TRASTUZUMAB (tras TOO zoo mab) treats breast cancer and stomach cancer. It works by blocking a protein that causes cancer cells to grow and multiply. This helps to slow or stop the spread of cancer cells. This medicine may be used for other purposes; ask your health care provider or pharmacist if you have questions. COMMON BRAND NAME(S): Herceptin, Herzuma, KANJINTI, Ogivri, Ontruzant, Trazimera What should I tell my care team before I take this medication? They need to know if you have any of these conditions: Heart failure Lung disease An unusual or allergic reaction to trastuzumab, other medications, foods, dyes, or preservatives Pregnant or trying to get pregnant Breast-feeding How should I use this medication? This medication is injected into a vein. It is given by your care team in a hospital or clinic setting. Talk to your care team about the use of this medication in children. It is not approved for use in children. Overdosage: If you think you have taken too much of this medicine contact a poison control center or emergency room at once. NOTE: This medicine is only for you. Do not share this medicine with others. What if I miss a dose? Keep appointments for follow-up doses. It is important not to miss your dose. Call your care team if you are unable to keep an appointment. What may interact with this medication? Certain types of chemotherapy, such as daunorubicin, doxorubicin, epirubicin, idarubicin This list may not describe all possible interactions. Give your health care provider a list of all the medicines, herbs, non-prescription drugs, or dietary supplements you use. Also tell them if you smoke, drink alcohol, or use illegal drugs. Some items may interact with your medicine. What should I watch for while using this medication? Your condition will be monitored carefully while you are receiving this medication. This medication may  make you feel generally unwell. This is not uncommon, as chemotherapy affects healthy cells as well as cancer cells. Report any side effects. Continue your course of treatment even though you feel ill unless your care team tells you to stop. This medication may increase your risk of getting an infection. Call your care team for advice if you get a fever, chills, sore throat, or other symptoms of a cold or flu. Do not treat yourself. Try to avoid being around people who are sick. Avoid taking medications that contain aspirin, acetaminophen, ibuprofen, naproxen, or ketoprofen unless instructed by your care team. These medications can hide a fever. Talk to your care team if you may be pregnant. Serious birth defects can occur if you take this medication during pregnancy and for 7 months after the last dose. You will need a negative pregnancy test before starting this medication. Contraception is recommended while taking this medication and for 7 months after the last dose. Your care team can help you find the option that works for you. Do not breastfeed while taking this medication and for 7 months after stopping treatment. What side effects may I notice from receiving this medication? Side effects that you should report to your care team as soon as possible: Allergic reactions or angioedema--skin rash, itching or hives, swelling of the face, eyes, lips, tongue, arms, or legs, trouble swallowing or breathing Dry cough, shortness of breath or trouble breathing Heart failure--shortness of breath, swelling of the ankles, feet, or hands, sudden weight gain, unusual weakness or fatigue Infection--fever, chills, cough, or sore throat Infusion reactions--chest pain, shortness of breath or trouble breathing, feeling faint or   lightheaded Side effects that usually do not require medical attention (report to your care team if they continue or are bothersome): Diarrhea Dizziness Headache Nausea Trouble  sleeping Vomiting This list may not describe all possible side effects. Call your doctor for medical advice about side effects. You may report side effects to FDA at 1-800-FDA-1088. Where should I keep my medication? This medication is given in a hospital or clinic. It will not be stored at home. NOTE: This sheet is a summary. It may not cover all possible information. If you have questions about this medicine, talk to your doctor, pharmacist, or health care provider.  2023 Elsevier/Gold Standard (2021-12-09 00:00:00)  

## 2022-12-05 ENCOUNTER — Telehealth: Payer: Self-pay

## 2022-12-05 NOTE — Telephone Encounter (Signed)
Pt wants to know if she needs another bone density test since she stopped the Reclast?     She has had 2 falls resulting in 2 rib fractures. Her mammo is due in May 202 q4.

## 2022-12-06 ENCOUNTER — Encounter: Payer: Self-pay | Admitting: Oncology

## 2022-12-13 DIAGNOSIS — R350 Frequency of micturition: Secondary | ICD-10-CM | POA: Diagnosis not present

## 2022-12-13 DIAGNOSIS — B372 Candidiasis of skin and nail: Secondary | ICD-10-CM | POA: Diagnosis not present

## 2022-12-13 DIAGNOSIS — Z6825 Body mass index (BMI) 25.0-25.9, adult: Secondary | ICD-10-CM | POA: Diagnosis not present

## 2022-12-15 MED FILL — Trastuzumab-anns For IV Soln 150 MG: INTRAVENOUS | Qty: 18 | Status: AC

## 2022-12-16 ENCOUNTER — Inpatient Hospital Stay: Payer: Medicare Other

## 2022-12-16 VITALS — BP 145/60 | HR 84 | Temp 98.2°F | Resp 18 | Ht 60.0 in | Wt 139.1 lb

## 2022-12-16 DIAGNOSIS — C787 Secondary malignant neoplasm of liver and intrahepatic bile duct: Secondary | ICD-10-CM | POA: Diagnosis not present

## 2022-12-16 DIAGNOSIS — F419 Anxiety disorder, unspecified: Secondary | ICD-10-CM | POA: Diagnosis not present

## 2022-12-16 DIAGNOSIS — C50912 Malignant neoplasm of unspecified site of left female breast: Secondary | ICD-10-CM | POA: Diagnosis not present

## 2022-12-16 DIAGNOSIS — Z5112 Encounter for antineoplastic immunotherapy: Secondary | ICD-10-CM | POA: Diagnosis not present

## 2022-12-16 DIAGNOSIS — F32A Depression, unspecified: Secondary | ICD-10-CM | POA: Diagnosis not present

## 2022-12-16 DIAGNOSIS — Z170 Estrogen receptor positive status [ER+]: Secondary | ICD-10-CM | POA: Diagnosis not present

## 2022-12-16 MED ORDER — SODIUM CHLORIDE 0.9% FLUSH: 10.0000 mL | INTRAVENOUS | Status: DC | PRN

## 2022-12-16 MED ORDER — TRASTUZUMAB-ANNS CHEMO 150 MG IV SOLR
6.0000 mg/kg | Freq: Once | INTRAVENOUS | Status: AC
  Administered 2022-12-16: 378 mg via INTRAVENOUS
  Filled 2022-12-16: qty 18

## 2022-12-16 MED ORDER — HEPARIN SOD (PORK) LOCK FLUSH 100 UNIT/ML IV SOLN: 500.0000 [IU] | Freq: Once | INTRAVENOUS | Status: DC | PRN

## 2022-12-16 MED ORDER — SODIUM CHLORIDE 0.9 % IV SOLN: Freq: Once | INTRAVENOUS | Status: AC

## 2022-12-16 NOTE — Patient Instructions (Signed)
Trastuzumab Injection What is this medication? TRASTUZUMAB (tras TOO zoo mab) treats breast cancer and stomach cancer. It works by blocking a protein that causes cancer cells to grow and multiply. This helps to slow or stop the spread of cancer cells. This medicine may be used for other purposes; ask your health care provider or pharmacist if you have questions. COMMON BRAND NAME(S): Herceptin, Herzuma, KANJINTI, Ogivri, Ontruzant, Trazimera What should I tell my care team before I take this medication? They need to know if you have any of these conditions: Heart failure Lung disease An unusual or allergic reaction to trastuzumab, other medications, foods, dyes, or preservatives Pregnant or trying to get pregnant Breast-feeding How should I use this medication? This medication is injected into a vein. It is given by your care team in a hospital or clinic setting. Talk to your care team about the use of this medication in children. It is not approved for use in children. Overdosage: If you think you have taken too much of this medicine contact a poison control center or emergency room at once. NOTE: This medicine is only for you. Do not share this medicine with others. What if I miss a dose? Keep appointments for follow-up doses. It is important not to miss your dose. Call your care team if you are unable to keep an appointment. What may interact with this medication? Certain types of chemotherapy, such as daunorubicin, doxorubicin, epirubicin, idarubicin This list may not describe all possible interactions. Give your health care provider a list of all the medicines, herbs, non-prescription drugs, or dietary supplements you use. Also tell them if you smoke, drink alcohol, or use illegal drugs. Some items may interact with your medicine. What should I watch for while using this medication? Your condition will be monitored carefully while you are receiving this medication. This medication may  make you feel generally unwell. This is not uncommon, as chemotherapy affects healthy cells as well as cancer cells. Report any side effects. Continue your course of treatment even though you feel ill unless your care team tells you to stop. This medication may increase your risk of getting an infection. Call your care team for advice if you get a fever, chills, sore throat, or other symptoms of a cold or flu. Do not treat yourself. Try to avoid being around people who are sick. Avoid taking medications that contain aspirin, acetaminophen, ibuprofen, naproxen, or ketoprofen unless instructed by your care team. These medications can hide a fever. Talk to your care team if you may be pregnant. Serious birth defects can occur if you take this medication during pregnancy and for 7 months after the last dose. You will need a negative pregnancy test before starting this medication. Contraception is recommended while taking this medication and for 7 months after the last dose. Your care team can help you find the option that works for you. Do not breastfeed while taking this medication and for 7 months after stopping treatment. What side effects may I notice from receiving this medication? Side effects that you should report to your care team as soon as possible: Allergic reactions or angioedema--skin rash, itching or hives, swelling of the face, eyes, lips, tongue, arms, or legs, trouble swallowing or breathing Dry cough, shortness of breath or trouble breathing Heart failure--shortness of breath, swelling of the ankles, feet, or hands, sudden weight gain, unusual weakness or fatigue Infection--fever, chills, cough, or sore throat Infusion reactions--chest pain, shortness of breath or trouble breathing, feeling faint or   lightheaded Side effects that usually do not require medical attention (report to your care team if they continue or are bothersome): Diarrhea Dizziness Headache Nausea Trouble  sleeping Vomiting This list may not describe all possible side effects. Call your doctor for medical advice about side effects. You may report side effects to FDA at 1-800-FDA-1088. Where should I keep my medication? This medication is given in a hospital or clinic. It will not be stored at home. NOTE: This sheet is a summary. It may not cover all possible information. If you have questions about this medicine, talk to your doctor, pharmacist, or health care provider.  2023 Elsevier/Gold Standard (2021-12-09 00:00:00)  

## 2023-01-04 ENCOUNTER — Ambulatory Visit
Admission: RE | Admit: 2023-01-04 | Discharge: 2023-01-04 | Disposition: A | Discharge status: Home / Self Care | Payer: Medicare Other | Source: Ambulatory Visit | Attending: Oncology | Admitting: Oncology

## 2023-01-04 ENCOUNTER — Inpatient Hospital Stay: Admission: RE | Admit: 2023-01-04 | Payer: Medicare Other | Source: Ambulatory Visit

## 2023-01-04 DIAGNOSIS — Z1231 Encounter for screening mammogram for malignant neoplasm of breast: Secondary | ICD-10-CM | POA: Diagnosis not present

## 2023-01-04 DIAGNOSIS — C50912 Malignant neoplasm of unspecified site of left female breast: Secondary | ICD-10-CM

## 2023-01-06 ENCOUNTER — Inpatient Hospital Stay: Payer: Medicare Other

## 2023-01-06 ENCOUNTER — Telehealth: Payer: Self-pay

## 2023-01-06 DIAGNOSIS — R051 Acute cough: Secondary | ICD-10-CM | POA: Diagnosis not present

## 2023-01-06 DIAGNOSIS — R509 Fever, unspecified: Secondary | ICD-10-CM | POA: Diagnosis not present

## 2023-01-06 DIAGNOSIS — Z20822 Contact with and (suspected) exposure to covid-19: Secondary | ICD-10-CM | POA: Diagnosis not present

## 2023-01-06 DIAGNOSIS — C50912 Malignant neoplasm of unspecified site of left female breast: Secondary | ICD-10-CM

## 2023-01-06 DIAGNOSIS — Z853 Personal history of malignant neoplasm of breast: Secondary | ICD-10-CM | POA: Diagnosis not present

## 2023-01-06 DIAGNOSIS — R058 Other specified cough: Secondary | ICD-10-CM | POA: Diagnosis not present

## 2023-01-06 NOTE — Telephone Encounter (Signed)
Pt reported to the [facility] staff that she had temp of 101 w/cough. They encouraged her to seek out an Urgent Care or PCP.

## 2023-01-07 DIAGNOSIS — R051 Acute cough: Secondary | ICD-10-CM | POA: Diagnosis not present

## 2023-01-09 ENCOUNTER — Other Ambulatory Visit: Payer: Self-pay | Admitting: Pharmacist

## 2023-01-09 DIAGNOSIS — I4819 Other persistent atrial fibrillation: Secondary | ICD-10-CM | POA: Diagnosis not present

## 2023-01-09 DIAGNOSIS — G8929 Other chronic pain: Secondary | ICD-10-CM | POA: Diagnosis not present

## 2023-01-09 DIAGNOSIS — C50912 Malignant neoplasm of unspecified site of left female breast: Secondary | ICD-10-CM

## 2023-01-09 DIAGNOSIS — M1990 Unspecified osteoarthritis, unspecified site: Secondary | ICD-10-CM | POA: Diagnosis not present

## 2023-01-09 DIAGNOSIS — Z853 Personal history of malignant neoplasm of breast: Secondary | ICD-10-CM | POA: Diagnosis not present

## 2023-01-09 DIAGNOSIS — E039 Hypothyroidism, unspecified: Secondary | ICD-10-CM | POA: Diagnosis not present

## 2023-01-09 DIAGNOSIS — C50919 Malignant neoplasm of unspecified site of unspecified female breast: Secondary | ICD-10-CM | POA: Diagnosis not present

## 2023-01-09 DIAGNOSIS — I351 Nonrheumatic aortic (valve) insufficiency: Secondary | ICD-10-CM | POA: Diagnosis not present

## 2023-01-09 DIAGNOSIS — C787 Secondary malignant neoplasm of liver and intrahepatic bile duct: Secondary | ICD-10-CM | POA: Diagnosis not present

## 2023-01-09 DIAGNOSIS — R059 Cough, unspecified: Secondary | ICD-10-CM | POA: Diagnosis not present

## 2023-01-09 DIAGNOSIS — I4891 Unspecified atrial fibrillation: Secondary | ICD-10-CM | POA: Diagnosis not present

## 2023-01-09 DIAGNOSIS — F32A Depression, unspecified: Secondary | ICD-10-CM | POA: Diagnosis not present

## 2023-01-09 DIAGNOSIS — A419 Sepsis, unspecified organism: Secondary | ICD-10-CM | POA: Diagnosis not present

## 2023-01-09 DIAGNOSIS — Z79899 Other long term (current) drug therapy: Secondary | ICD-10-CM | POA: Diagnosis not present

## 2023-01-09 DIAGNOSIS — Z7969 Long term (current) use of other immunomodulators and immunosuppressants: Secondary | ICD-10-CM | POA: Diagnosis not present

## 2023-01-09 DIAGNOSIS — J159 Unspecified bacterial pneumonia: Secondary | ICD-10-CM | POA: Diagnosis not present

## 2023-01-09 DIAGNOSIS — E876 Hypokalemia: Secondary | ICD-10-CM | POA: Diagnosis not present

## 2023-01-09 DIAGNOSIS — J811 Chronic pulmonary edema: Secondary | ICD-10-CM | POA: Diagnosis not present

## 2023-01-09 DIAGNOSIS — R0602 Shortness of breath: Secondary | ICD-10-CM | POA: Diagnosis not present

## 2023-01-09 DIAGNOSIS — Z79891 Long term (current) use of opiate analgesic: Secondary | ICD-10-CM | POA: Diagnosis not present

## 2023-01-09 DIAGNOSIS — I484 Atypical atrial flutter: Secondary | ICD-10-CM | POA: Diagnosis not present

## 2023-01-09 DIAGNOSIS — L089 Local infection of the skin and subcutaneous tissue, unspecified: Secondary | ICD-10-CM | POA: Diagnosis not present

## 2023-01-09 DIAGNOSIS — Z87891 Personal history of nicotine dependence: Secondary | ICD-10-CM | POA: Diagnosis not present

## 2023-01-09 DIAGNOSIS — R509 Fever, unspecified: Secondary | ICD-10-CM | POA: Diagnosis not present

## 2023-01-09 DIAGNOSIS — Z881 Allergy status to other antibiotic agents status: Secondary | ICD-10-CM | POA: Diagnosis not present

## 2023-01-09 DIAGNOSIS — C7800 Secondary malignant neoplasm of unspecified lung: Secondary | ICD-10-CM | POA: Diagnosis not present

## 2023-01-09 DIAGNOSIS — R197 Diarrhea, unspecified: Secondary | ICD-10-CM | POA: Diagnosis not present

## 2023-01-09 DIAGNOSIS — Z8542 Personal history of malignant neoplasm of other parts of uterus: Secondary | ICD-10-CM | POA: Diagnosis not present

## 2023-01-09 DIAGNOSIS — E871 Hypo-osmolality and hyponatremia: Secondary | ICD-10-CM | POA: Diagnosis not present

## 2023-01-09 DIAGNOSIS — I444 Left anterior fascicular block: Secondary | ICD-10-CM | POA: Diagnosis not present

## 2023-01-09 DIAGNOSIS — Z888 Allergy status to other drugs, medicaments and biological substances status: Secondary | ICD-10-CM | POA: Diagnosis not present

## 2023-01-09 DIAGNOSIS — R000 Tachycardia, unspecified: Secondary | ICD-10-CM | POA: Diagnosis not present

## 2023-01-09 DIAGNOSIS — R9431 Abnormal electrocardiogram [ECG] [EKG]: Secondary | ICD-10-CM | POA: Diagnosis not present

## 2023-01-09 DIAGNOSIS — Z792 Long term (current) use of antibiotics: Secondary | ICD-10-CM | POA: Diagnosis not present

## 2023-01-10 ENCOUNTER — Ambulatory Visit: Payer: Medicare Other

## 2023-01-10 DIAGNOSIS — I351 Nonrheumatic aortic (valve) insufficiency: Secondary | ICD-10-CM | POA: Diagnosis not present

## 2023-01-11 DIAGNOSIS — A419 Sepsis, unspecified organism: Secondary | ICD-10-CM

## 2023-01-11 DIAGNOSIS — I351 Nonrheumatic aortic (valve) insufficiency: Secondary | ICD-10-CM

## 2023-01-11 DIAGNOSIS — C50919 Malignant neoplasm of unspecified site of unspecified female breast: Secondary | ICD-10-CM

## 2023-01-11 DIAGNOSIS — I4891 Unspecified atrial fibrillation: Secondary | ICD-10-CM

## 2023-01-12 ENCOUNTER — Encounter: Payer: Self-pay | Admitting: Oncology

## 2023-01-12 DIAGNOSIS — E871 Hypo-osmolality and hyponatremia: Secondary | ICD-10-CM | POA: Diagnosis not present

## 2023-01-12 DIAGNOSIS — C50919 Malignant neoplasm of unspecified site of unspecified female breast: Secondary | ICD-10-CM | POA: Diagnosis not present

## 2023-01-12 DIAGNOSIS — J159 Unspecified bacterial pneumonia: Secondary | ICD-10-CM | POA: Diagnosis not present

## 2023-01-12 DIAGNOSIS — I4891 Unspecified atrial fibrillation: Secondary | ICD-10-CM | POA: Diagnosis not present

## 2023-01-13 ENCOUNTER — Ambulatory Visit: Payer: Medicare Other

## 2023-01-13 DIAGNOSIS — I4819 Other persistent atrial fibrillation: Secondary | ICD-10-CM | POA: Diagnosis not present

## 2023-01-19 ENCOUNTER — Ambulatory Visit: Payer: Medicare Other | Admitting: Obstetrics and Gynecology

## 2023-01-19 NOTE — Progress Notes (Signed)
[facility]  [address] [place],  [place]  [address] [phone]  Clinic Day:  01/24/23  Referring physician: Philemon Kingdom, MD  CHIEF COMPLAINT:  CC: Stage IV breast cancer  Current Treatment:  Trastuzumab IV every 3 weeks  HISTORY OF PRESENT ILLNESS:  Karen Allison is a 77 y.o. 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  Estrogen and progesterone receptors were positive and HER2 was positive, and scans revealed liver metastases.  She was placed on a clinical trial at [facility] consisting of letrozole and trastuzumab, but she stopped the letrozole after several months because of severe arthralgias.  She also has known osteoarthritis, degenerative disc disease, and osteoporosis, and has had several back surgeries.  She is been on single agent trastuzumab now for over 12 years with good control of her disease.  This was stopped temporarily in 2011 but a PET scan a few months later revealed a new 9 mm lung nodule felt to represent recurrent disease, which did resolve after she was placed back on the trastuzumab.  She sees Dr. Costella Hatcher at [facility] yearly now, usually in July and he does scans and labs at that time.  We do perform periodic echocardiograms and her last one was in July.  She did have her last bone density scan in July of 2015, with no change, and receives Reclast every 6 months.  She had cervical disc surgery in September of 2016, and then developed a hospital-acquired pneumonia which took a long time to clear.  She had colonoscopy within the last 2 years.  She had a fall in September with 2 fractured ribs.  Her medication list was reviewed.  When I review her Reclast doses, she received this every 6 months from July of 2014 to January of 2016, and then yearly, with the last dose in January of 2017.  She was having some  tooth sensitivity but this is no longer an issue.  I did review her family history and the patient had endometrial cancer in her 29's and breast cancer in her 84's. She has a first cousin who had breast cancer and a paternal grandmother who had liver cancer, possibly metastatic.  An aunt had liver cancer.  A step uncle had multiple daughters with breast cancer.  The patient was seen in November of 2017 because of a tenderness and possible lump in the right upper outer quadrant.  A mammogram and ultrasound just revealed normal fibro fatty tissue.  She was evaluated at [facility] by Dr. Costella Hatcher in July of 2017 and there were some concerns about her scans.  There appeared to be a rim enhancing fluid collection intramuscular in the left vastus lateralis muscle measuring 2 cm which appeared new.  There was a new right posterior 6th rib expansile sclerotic lesion measuring 2.3 cm as well as an old healed right 5th rib fracture which had been stable from the year before.  She did have an MRI of the hip in August which was nonspecific with extensive postoperative findings in the lumbar spine as well as osteoarthritis, osteophytes, and degenerative changes.  There was a simple appearing fluid collection interposed between the iliotibial band and proximal margin of the vastus lateralis consistent with a small ganglion cyst or prior shearing injury.  We repeated the CT of the chest in January of 2018 to follow up  on the expansile lesion of the rib and this remained stable, and consistent with posttraumatic fracture.  She also fractured her right scapula after a fall and she has fractured several ribs on the right.  Her last ECHO in March 2022 remains normal, with an EF of 55 to 60%.  INTERVAL HISTORY:  Karen Allison is here for a follow up for stage IV breast cancer and is on maintenance Herceptin. Patient states that she feels well but complains of fatigue with exertion and chronic pain as she is still getting over pneumonia. She  was admitted into the hospital on 01/09/2023 for cough, SHOB, congestion and a low sodium of 113.  She had a CT scan on 01/09/2023 that revealed pneumonia. She had an echocardiogram on 01/10/2023 that showed an ejection fraction was 55-60%, stable since January.  She was placed on 20mg  IV lasix and IV cefepime doxycyline during her stay. After that she was discharged on Toprol 25 mg twice daily and Lasix daily but Dr. Sudie Bailey has decreased the Toprol to once daily and recommended Lasix as needed. I agree with these changes but we will ask Dr. Dulce Sellar to follow up since she had atrial fib in the hospital with RVR. Her pulse is very regular and her rate is controlled today. The nephrologist told her she might be getting heart damage from her chronic Herceptin. She has been on this for about 14 years but we get echocardiograms regularly and this has been stable so we will ask Dr. Hulen Shouts opinion.  She informed me that she has had persistent worsening headaches in the back of her head since her last fall in December, 2023. I will schedule a MRI of the head now. She asked me if I can take over scheduling her CT scans and follow-up so she can avoid going to [facility] in the future and I am glad to accommodate that. Her day 1 cycle 45 of Herceptin is scheduled on 01/27/2023. Her labs today are pending. I will see her back in 6 weeks with CBC, CMP, and CT chest, abdomen, and pelvis. She denies signs of infection such as sore throat, sinus drainage, cough, or urinary symptoms.  She denies fevers or recurrent chills. She denies pain. She denies nausea, vomiting, chest pain, dyspnea or cough. Her appetite is ok and her weight has decreased 7 pounds over last 1.5 months .  REVIEW OF SYSTEMS:  Review of Systems  Constitutional: Negative.  Negative for appetite change, chills, diaphoresis, fatigue, fever and unexpected weight change.  HENT:  Negative.  Negative for hearing loss, lump/mass, mouth sores, nosebleeds, sore throat,  tinnitus, trouble swallowing and voice change.   Eyes: Negative.  Negative for eye problems and icterus.  Respiratory: Negative.  Negative for chest tightness, cough, hemoptysis, shortness of breath and wheezing.   Cardiovascular: Negative.  Negative for chest pain, leg swelling and palpitations.  Gastrointestinal: Negative.  Negative for abdominal distention, abdominal pain, blood in stool, constipation, diarrhea, nausea, rectal pain and vomiting.  Endocrine: Negative.   Genitourinary: Negative.  Negative for bladder incontinence, difficulty urinating, dyspareunia, dysuria, frequency, hematuria, menstrual problem, nocturia, pelvic pain, vaginal bleeding and vaginal discharge.   Musculoskeletal:  Positive for back pain and gait problem (with three falls this past month, using a walker). Negative for arthralgias, flank pain, myalgias, neck pain and neck stiffness.       She has osteoporosis with many prior fractures  Skin: Negative.  Negative for itching, rash and wound.  Neurological:  Positive for gait problem (  with three falls this past month, using a walker) and headaches. Negative for dizziness, extremity weakness, light-headedness, numbness, seizures and speech difficulty.  Hematological: Negative.  Negative for adenopathy. Does not bruise/bleed easily.  Psychiatric/Behavioral:  Positive for depression. Negative for confusion, decreased concentration, sleep disturbance and suicidal ideas. The patient is nervous/anxious.      VITALS:  Blood pressure (!) 172/79, pulse 71, temperature 98.7 F (37.1 C), resp. rate 14, height 5' (1.524 m), weight 132 lb 12.8 oz (60.2 kg), SpO2 100 %.  Wt Readings from Last 3 Encounters:  01/27/23 132 lb (59.9 kg)  01/24/23 132 lb 12.8 oz (60.2 kg)  12/16/22 139 lb 1.9 oz (63.1 kg)    Body mass index is 25.94 kg/m.  Performance status (ECOG): 1 - Symptomatic but completely ambulatory  PHYSICAL EXAM:  Physical Exam Vitals and nursing note reviewed.   Constitutional:      General: She is not in acute distress.    Appearance: Normal appearance. She is normal weight. She is not ill-appearing, toxic-appearing or diaphoretic.  HENT:     Head: Normocephalic and atraumatic.     Right Ear: Tympanic membrane, ear canal and external ear normal. There is no impacted cerumen.     Left Ear: Tympanic membrane, ear canal and external ear normal. There is no impacted cerumen.     Nose: Nose normal. No congestion or rhinorrhea.     Mouth/Throat:     Mouth: Mucous membranes are moist.     Pharynx: Oropharynx is clear. No oropharyngeal exudate or posterior oropharyngeal erythema.  Eyes:     General: No scleral icterus.       Right eye: No discharge.        Left eye: No discharge.     Extraocular Movements: Extraocular movements intact.     Conjunctiva/sclera: Conjunctivae normal.     Pupils: Pupils are equal, round, and reactive to light.  Neck:     Vascular: No carotid bruit.  Cardiovascular:     Rate and Rhythm: Normal rate and regular rhythm.     Pulses: Normal pulses.     Heart sounds: Normal heart sounds. No murmur heard.    No friction rub. No gallop.  Pulmonary:     Effort: Pulmonary effort is normal. No respiratory distress.     Breath sounds: No stridor. No wheezing, rhonchi or rales.  Chest:     Chest wall: No mass or tenderness.  Breasts:    Right: Normal. No mass.     Left: Normal. No mass.     Comments: She has a well healed scar above the left central breast above the nipple/areolar complex.   Abdominal:     General: Bowel sounds are normal. There is no distension.     Palpations: Abdomen is soft. There is no hepatomegaly, splenomegaly or mass.     Tenderness: There is no abdominal tenderness. There is no right CVA tenderness, left CVA tenderness, guarding or rebound.     Hernia: No hernia is present.  Musculoskeletal:        General: No swelling, tenderness, deformity or signs of injury. Normal range of motion.      Cervical back: Normal range of motion and neck supple. No rigidity or tenderness.     Right lower leg: No edema.     Left lower leg: No edema.  Lymphadenopathy:     Cervical: No cervical adenopathy.  Skin:    General: Skin is warm and dry.     Coloration:  Skin is not jaundiced or pale.     Findings: No bruising, erythema, lesion or rash.  Neurological:     General: No focal deficit present.     Mental Status: She is alert and oriented to person, place, and time. Mental status is at baseline.     Cranial Nerves: No cranial nerve deficit.     Sensory: No sensory deficit.     Motor: No weakness.     Coordination: Coordination normal.     Gait: Gait normal.     Deep Tendon Reflexes: Reflexes normal.  Psychiatric:        Mood and Affect: Mood normal.        Behavior: Behavior normal.        Thought Content: Thought content normal.        Judgment: Judgment normal.     LABS:      Latest Ref Rng & Units 01/24/2023    3:23 PM 08/30/2022   11:50 AM 07/28/2022   12:00 AM  CBC  WBC 4.0 - 10.5 K/uL 5.3  6.6  6.8      Hemoglobin 12.0 - 15.0 g/dL 16.1  09.6  04.5      Hematocrit 36.0 - 46.0 % 35.8  39.8  35      Platelets 150 - 400 K/uL 234  183  189         This result is from an external source.      Latest Ref Rng & Units 01/24/2023    3:23 PM 08/30/2022   11:50 AM 07/28/2022   12:00 AM  CMP  Glucose 70 - 99 mg/dL 409  811    BUN 8 - 23 mg/dL 13  13  5       Creatinine 0.44 - 1.00 mg/dL 9.14  7.82  0.8      Sodium 135 - 145 mmol/L 129  135  134      Potassium 3.5 - 5.1 mmol/L 4.0  4.7  4.1      Chloride 98 - 111 mmol/L 95  98  92      CO2 22 - 32 mmol/L 26  28  24       Calcium 8.9 - 10.3 mg/dL 8.6  9.0  9.1      Total Protein 6.5 - 8.1 g/dL 6.9  7.5    Total Bilirubin 0.3 - 1.2 mg/dL 0.5  0.5    Alkaline Phos 38 - 126 U/L 74  86  86      AST 15 - 41 U/L 22  25  97      ALT 0 - 44 U/L 15  16  52         This result is from an external source.   Lab Results  Component Value  Date   LDH 230 03/10/2006   STUDIES:  EXAM: 01/04/2023 DIGITAL SCREENING BILATERAL MAMMOGRAM WITH TOMOSYNTHESIS AND CAD IMPRESSION: No mammographic evidence of malignancy. A result letter of this screening mammogram will be mailed directly to the patient.     Allergies:  Allergies  Allergen Reactions   Cefuroxime Hives   Zithromax [Azithromycin] Hives    Current Medications: Current Outpatient Medications  Medication Sig Dispense Refill   aspirin EC 81 MG tablet Take 1 tablet by mouth daily.     atenolol (TENORMIN) 25 MG tablet Take 25 mg by mouth daily.      B Complex Vitamins (B COMPLEX PO) Take by mouth.     Calcium Carbonate-Vitamin  D (CALTRATE 600+D PO) Take 1 tablet by mouth 2 (two) times daily.     celecoxib (CELEBREX) 200 MG capsule Take 200 mg by mouth daily.      cholecalciferol (VITAMIN D3) 25 MCG (1000 UNIT) tablet 1 tablet     diazepam (VALIUM) 10 MG tablet Take 10 mg by mouth at bedtime.      diclofenac sodium (VOLTAREN) 1 % GEL Apply 4 g topically 4 (four) times daily. 350 g 3   docusate sodium (COLACE) 100 MG capsule Take 200 mg by mouth 2 (two) times daily as needed for mild constipation.      doxycycline (VIBRAMYCIN) 100 MG capsule Take 100 mg by mouth 2 (two) times daily.     FLUAD 0.5 ML SUSY See admin instructions.  0   fluconazole (DIFLUCAN) 150 MG tablet Take 150 mg by mouth once.     furosemide (LASIX) 20 MG tablet Take 20-40 mg by mouth daily as needed.     gabapentin (NEURONTIN) 600 MG tablet Take 600-1,200 mg by mouth 4 (four) times daily - after meals and at bedtime. 1 tablet (600 mg) 3 times a day and 2 tablets(1200mg ) at bedtime     HYDROcodone-acetaminophen (NORCO) 10-325 MG tablet Take 1 tablet by mouth every 6 (six) hours as needed.     ketoconazole (NIZORAL) 2 % shampoo      levothyroxine (SYNTHROID, LEVOTHROID) 50 MCG tablet Take 50 mcg by mouth daily.     methocarbamol (ROBAXIN) 500 MG tablet Take 500 mg by mouth 4 (four) times daily.      metroNIDAZOLE (METROCREAM) 0.75 % cream Apply topically.     Multiple Vitamin (MULTIVITAMIN WITH MINERALS) TABS Take 1 tablet by mouth daily.     mupirocin ointment (BACTROBAN) 2 % Apply topically 3 (three) times daily.     omeprazole (PRILOSEC) 20 MG capsule Take 1 capsule (20 mg total) by mouth 2 (two) times daily before a meal. 60 capsule 3   ondansetron (ZOFRAN) 8 MG tablet Take 8 mg by mouth 2 (two) times daily as needed.     promethazine (PHENERGAN) 25 MG tablet Take 25 mg by mouth every 6 (six) hours as needed for nausea or vomiting.     rosuvastatin (CRESTOR) 10 MG tablet Take 10 mg by mouth once a week.     sennosides-docusate sodium (SENOKOT-S) 8.6-50 MG tablet Take 1 tablet by mouth daily.     trastuzumab (HERCEPTIN) 440 MG injection 440 mg by Intravenous (Continuous Infusion) route every 21 ( twenty-one) days. infusion every 3 weeks     No current facility-administered medications for this visit.     ASSESSMENT & PLAN:  Assessment:   1.  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 diagnosed in December 2003.  Subsequent x-rays have shown possible bone metastases as well.  She was treated for a brief time with hormonal therapy but was unable to tolerate that.  She currently is on trastuzumab every 3 weeks and continues to have her disease under control. Her last scans were done at [facility] in July of 2023, and were clear. She has now asked me to take over her follow up and scans and I have agreed.   2.  Osteoporosis, for which she is on yearly Reclast since July 2014.  She has had many fractures of spine, ribs, scapula.  However, she has now completed 10 doses and so I recommended that we stop the Reclast and  continue to monitor her bone density.  Her bone density scan in May was stable.   3.  Mild chronic hyponatremia of unknown etiology.  Her sodium is stable was 131 last time but went down to 113 in late May and she was  hospitalized.   4. Multiple old and new rib fractures.   Plan: She was admitted into the hospital on 01/09/2023 for cough, SHOB, congestion and a low sodium of 113. She had a CT scan on 01/09/2023 that revealed pneumonia. She had an echocardiogram on 01/10/2023 that showed ejection fraction was 55-60%, stable since January.  She was placed on 20mg  IV lasix and IV cefepime doxycyline during her stay. After that she was discharged on Toprol 25mg  twice daily and Lasix daily but Dr. Sudie Bailey has decreased a Toprol once daily and recommended Lasix as needed. I agree with these changes but we will ask Dr. Dulce Sellar to follow up since she had a-fib in the hospital with RVR. She is very regular and her rate is controlled today. The nephrologist told her she might be getting heart damage from her chronic Herceptin. She has been on this for about 14 years but we get echocardiograms regularly and this has been stable so we will ask his opinion.  She informed me that she has had persistent worsening headaches in the back of her head since her last fall in December, 2023. I will schedule a MRI of the head now. She asked me if I can take over scheduling her CT scans and follow-up so she can avoid going to [facility] in the future and I am glad to accommodate that. Her day 1 cycle 45 of Herceptin is scheduled on 01/27/2023. Her labs today are pending. I will see her back in 6 weeks with CBC, CMP, and CT chest, abdomen, and pelvis. She knows to call sooner if problems arise regarding her breast cancer or its treatment.  She understands and agrees with this plan of care.   I provided 20 minutes of face-to-face time during this this encounter and > 50% was spent counseling as documented under my assessment and plan.    Dellia Beckwith, MD [facility] [address] [place] [address] Dept: [phone] Dept Fax: [phone]     Rulon Sera  Lassiter,acting as a scribe for Dellia Beckwith, MD.,have documented all relevant documentation on the behalf of Dellia Beckwith, MD,as directed by  Dellia Beckwith, MD while in the presence of Dellia Beckwith, MD.

## 2023-01-23 DIAGNOSIS — R339 Retention of urine, unspecified: Secondary | ICD-10-CM | POA: Diagnosis not present

## 2023-01-23 DIAGNOSIS — I480 Paroxysmal atrial fibrillation: Secondary | ICD-10-CM | POA: Diagnosis not present

## 2023-01-23 DIAGNOSIS — J189 Pneumonia, unspecified organism: Secondary | ICD-10-CM | POA: Diagnosis not present

## 2023-01-23 DIAGNOSIS — E871 Hypo-osmolality and hyponatremia: Secondary | ICD-10-CM | POA: Diagnosis not present

## 2023-01-23 DIAGNOSIS — Z6824 Body mass index (BMI) 24.0-24.9, adult: Secondary | ICD-10-CM | POA: Diagnosis not present

## 2023-01-24 ENCOUNTER — Other Ambulatory Visit: Payer: Self-pay | Admitting: Oncology

## 2023-01-24 ENCOUNTER — Encounter: Payer: Self-pay | Admitting: Oncology

## 2023-01-24 ENCOUNTER — Inpatient Hospital Stay: Payer: Medicare Other | Attending: Oncology | Admitting: Oncology

## 2023-01-24 ENCOUNTER — Inpatient Hospital Stay: Payer: Medicare Other

## 2023-01-24 VITALS — BP 172/79 | HR 71 | Temp 98.7°F | Resp 14 | Ht 60.0 in | Wt 132.8 lb

## 2023-01-24 DIAGNOSIS — Z79620 Long term (current) use of immunosuppressive biologic: Secondary | ICD-10-CM | POA: Diagnosis not present

## 2023-01-24 DIAGNOSIS — Z881 Allergy status to other antibiotic agents status: Secondary | ICD-10-CM | POA: Diagnosis not present

## 2023-01-24 DIAGNOSIS — I4891 Unspecified atrial fibrillation: Secondary | ICD-10-CM | POA: Insufficient documentation

## 2023-01-24 DIAGNOSIS — R5383 Other fatigue: Secondary | ICD-10-CM | POA: Insufficient documentation

## 2023-01-24 DIAGNOSIS — M810 Age-related osteoporosis without current pathological fracture: Secondary | ICD-10-CM | POA: Diagnosis not present

## 2023-01-24 DIAGNOSIS — Z79899 Other long term (current) drug therapy: Secondary | ICD-10-CM | POA: Insufficient documentation

## 2023-01-24 DIAGNOSIS — R059 Cough, unspecified: Secondary | ICD-10-CM | POA: Insufficient documentation

## 2023-01-24 DIAGNOSIS — E871 Hypo-osmolality and hyponatremia: Secondary | ICD-10-CM | POA: Insufficient documentation

## 2023-01-24 DIAGNOSIS — C7800 Secondary malignant neoplasm of unspecified lung: Secondary | ICD-10-CM | POA: Diagnosis not present

## 2023-01-24 DIAGNOSIS — M8000XG Age-related osteoporosis with current pathological fracture, unspecified site, subsequent encounter for fracture with delayed healing: Secondary | ICD-10-CM | POA: Diagnosis not present

## 2023-01-24 DIAGNOSIS — C50912 Malignant neoplasm of unspecified site of left female breast: Secondary | ICD-10-CM

## 2023-01-24 DIAGNOSIS — M2550 Pain in unspecified joint: Secondary | ICD-10-CM | POA: Insufficient documentation

## 2023-01-24 DIAGNOSIS — R911 Solitary pulmonary nodule: Secondary | ICD-10-CM | POA: Diagnosis not present

## 2023-01-24 DIAGNOSIS — M549 Dorsalgia, unspecified: Secondary | ICD-10-CM | POA: Insufficient documentation

## 2023-01-24 DIAGNOSIS — Z5112 Encounter for antineoplastic immunotherapy: Secondary | ICD-10-CM | POA: Insufficient documentation

## 2023-01-24 DIAGNOSIS — C787 Secondary malignant neoplasm of liver and intrahepatic bile duct: Secondary | ICD-10-CM | POA: Insufficient documentation

## 2023-01-24 DIAGNOSIS — Z170 Estrogen receptor positive status [ER+]: Secondary | ICD-10-CM | POA: Diagnosis not present

## 2023-01-24 DIAGNOSIS — G8929 Other chronic pain: Secondary | ICD-10-CM | POA: Insufficient documentation

## 2023-01-24 DIAGNOSIS — Z800 Family history of malignant neoplasm of digestive organs: Secondary | ICD-10-CM | POA: Diagnosis not present

## 2023-01-24 DIAGNOSIS — R519 Headache, unspecified: Secondary | ICD-10-CM

## 2023-01-24 LAB — CMP (CANCER CENTER ONLY)
ALT: 15 U/L (ref 0–44)
AST: 22 U/L (ref 15–41)
Albumin: 3.8 g/dL (ref 3.5–5.0)
Alkaline Phosphatase: 74 U/L (ref 38–126)
Anion gap: 8 (ref 5–15)
BUN: 13 mg/dL (ref 8–23)
CO2: 26 mmol/L (ref 22–32)
Calcium: 8.6 mg/dL — ABNORMAL LOW (ref 8.9–10.3)
Chloride: 95 mmol/L — ABNORMAL LOW (ref 98–111)
Creatinine: 0.77 mg/dL (ref 0.44–1.00)
GFR, Estimated: >60 mL/min (ref >60)
Glucose, Bld: 110 mg/dL — ABNORMAL HIGH (ref 70–99)
Potassium: 4 mmol/L (ref 3.5–5.1)
Sodium: 129 mmol/L — ABNORMAL LOW (ref 135–145)
Total Bilirubin: 0.5 mg/dL (ref 0.3–1.2)
Total Protein: 6.9 g/dL (ref 6.5–8.1)

## 2023-01-24 LAB — CBC WITH DIFFERENTIAL (CANCER CENTER ONLY)
Abs Immature Granulocytes: 0.01 10*3/uL (ref 0.00–0.07)
Basophils Absolute: 0 10*3/uL (ref 0.0–0.1)
Basophils Relative: 0 %
Eosinophils Absolute: 0.1 10*3/uL (ref 0.0–0.5)
Eosinophils Relative: 2 %
HCT: 35.8 % — ABNORMAL LOW (ref 36.0–46.0)
Hemoglobin: 11.9 g/dL — ABNORMAL LOW (ref 12.0–15.0)
Immature Granulocytes: 0 %
Lymphocytes Relative: 22 %
Lymphs Abs: 1.2 10*3/uL (ref 0.7–4.0)
MCH: 31.6 pg (ref 26.0–34.0)
MCHC: 33.2 g/dL (ref 30.0–36.0)
MCV: 95 fL (ref 80.0–100.0)
Monocytes Absolute: 0.5 10*3/uL (ref 0.1–1.0)
Monocytes Relative: 10 %
Neutro Abs: 3.5 10*3/uL (ref 1.7–7.7)
Neutrophils Relative %: 66 %
Platelet Count: 234 10*3/uL (ref 150–400)
RBC: 3.77 MIL/uL — ABNORMAL LOW (ref 3.87–5.11)
RDW: 12.2 % (ref 11.5–15.5)
WBC Count: 5.3 10*3/uL (ref 4.0–10.5)
nRBC: 0 % (ref 0.0–0.2)

## 2023-01-26 ENCOUNTER — Telehealth: Payer: Self-pay

## 2023-01-26 ENCOUNTER — Other Ambulatory Visit: Payer: Self-pay | Admitting: Pharmacist

## 2023-01-26 NOTE — Telephone Encounter (Signed)
Attempted to contact patient. No answer. 

## 2023-01-26 NOTE — Telephone Encounter (Signed)
-----   Message from Dellia Beckwith, MD sent at 01/24/2023  7:37 PM EDT ----- Regarding: call Tell her sodium holding at 129 and rest looks okay, just low on calcium, needs to increase supp.

## 2023-01-27 ENCOUNTER — Inpatient Hospital Stay: Payer: Medicare Other

## 2023-01-27 ENCOUNTER — Ambulatory Visit: Payer: Medicare Other

## 2023-01-27 VITALS — BP 160/70 | HR 70 | Temp 98.0°F | Resp 18 | Ht 60.0 in | Wt 132.0 lb

## 2023-01-27 DIAGNOSIS — M2550 Pain in unspecified joint: Secondary | ICD-10-CM | POA: Diagnosis not present

## 2023-01-27 DIAGNOSIS — C50912 Malignant neoplasm of unspecified site of left female breast: Secondary | ICD-10-CM

## 2023-01-27 DIAGNOSIS — C7800 Secondary malignant neoplasm of unspecified lung: Secondary | ICD-10-CM | POA: Diagnosis not present

## 2023-01-27 DIAGNOSIS — C787 Secondary malignant neoplasm of liver and intrahepatic bile duct: Secondary | ICD-10-CM | POA: Diagnosis not present

## 2023-01-27 DIAGNOSIS — Z170 Estrogen receptor positive status [ER+]: Secondary | ICD-10-CM | POA: Diagnosis not present

## 2023-01-27 DIAGNOSIS — Z5112 Encounter for antineoplastic immunotherapy: Secondary | ICD-10-CM | POA: Diagnosis not present

## 2023-01-27 MED ORDER — SODIUM CHLORIDE 0.9 % IV SOLN: Freq: Once | INTRAVENOUS | Status: AC

## 2023-01-27 MED ORDER — SODIUM CHLORIDE 0.9% FLUSH: 10.0000 mL | INTRAVENOUS | Status: DC | PRN

## 2023-01-27 MED ORDER — TRASTUZUMAB-ANNS CHEMO 420 MG IV SOLR
6.0000 mg/kg | Freq: Once | INTRAVENOUS | Status: AC
  Administered 2023-01-27: 378 mg via INTRAVENOUS
  Filled 2023-01-27 (×2): qty 18

## 2023-01-27 MED ORDER — HEPARIN SOD (PORK) LOCK FLUSH 100 UNIT/ML IV SOLN: 500.0000 [IU] | Freq: Once | INTRAVENOUS | Status: DC | PRN

## 2023-01-27 NOTE — Progress Notes (Signed)
Do not reload trastuzumab on 01/27/23 per Dr. Gilman Buttner.

## 2023-01-30 ENCOUNTER — Telehealth: Payer: Self-pay | Admitting: Oncology

## 2023-01-30 ENCOUNTER — Telehealth: Payer: Self-pay

## 2023-01-30 NOTE — Telephone Encounter (Signed)
01/30/23 Spoke with patient and schduled MRI BRAIN on 02/03/23@3pm 

## 2023-01-30 NOTE — Telephone Encounter (Signed)
Patient notified of lab results. Patient does state that she did not take her calcium the whole time she was admitted into the hospital. When discharged patient started taking her calcium supplement again.

## 2023-01-30 NOTE — Telephone Encounter (Signed)
01/30/23 Spoke with patient and scheduled MRI BRAIN on 02/03/23@3pm 

## 2023-01-30 NOTE — Telephone Encounter (Signed)
-----   Message from Christine H McCarty, MD sent at 01/24/2023  7:37 PM EDT ----- Regarding: call Tell her sodium holding at 129 and rest looks okay, just low on calcium, needs to increase supp.  

## 2023-02-03 ENCOUNTER — Encounter: Payer: Self-pay | Admitting: Oncology

## 2023-02-03 ENCOUNTER — Ambulatory Visit: Payer: Medicare Other

## 2023-02-03 DIAGNOSIS — R519 Headache, unspecified: Secondary | ICD-10-CM | POA: Diagnosis not present

## 2023-02-08 DIAGNOSIS — Z79899 Other long term (current) drug therapy: Secondary | ICD-10-CM | POA: Diagnosis not present

## 2023-02-09 ENCOUNTER — Telehealth: Payer: Self-pay

## 2023-02-09 ENCOUNTER — Other Ambulatory Visit: Payer: Self-pay

## 2023-02-09 DIAGNOSIS — I4891 Unspecified atrial fibrillation: Secondary | ICD-10-CM

## 2023-02-09 NOTE — Telephone Encounter (Signed)
I do not know anything about Dr. Dulce Sellar referral. Do I need to make a referral to Dr. Dulce Sellar? If so, what for?

## 2023-02-09 NOTE — Telephone Encounter (Signed)
Referral to Cardiology Dr. Dulce Sellar sent electronically on 02/09/23 per Dr. Gilman Buttner request.

## 2023-02-09 NOTE — Telephone Encounter (Signed)
-----   Message from Thomasena Edis sent at 02/09/2023 11:37 AM EDT ----- Regarding: CT SCAN,Dr [employer] Patient asking if she has to drink the contrast for CT Scans (request IV ONLY)and if the referral has been sent to Dr [facility] office.

## 2023-02-09 NOTE — Telephone Encounter (Signed)
-----   Message from Dellia Beckwith, MD sent at 02/09/2023  2:56 PM EDT ----- Regarding: RE: CT SCAN,Dr Munley I usually rec the oral contrast but if she doesn't want it, we will manage without it. Marylene Land, check on ref ----- Message ----- From: Thomasena Edis Sent: 02/09/2023  11:40 AM EDT To: Dellia Beckwith, MD; Jeannette Corpus, LPN Subject: CT SCAN,Dr [facility]                              Patient asking if she has to drink the contrast for CT Scans (request IV ONLY)and if the referral has been sent to Dr [facility] office.

## 2023-02-13 ENCOUNTER — Encounter: Payer: Self-pay | Admitting: *Deleted

## 2023-02-13 DIAGNOSIS — Z8639 Personal history of other endocrine, nutritional and metabolic disease: Secondary | ICD-10-CM

## 2023-02-13 DIAGNOSIS — Z6824 Body mass index (BMI) 24.0-24.9, adult: Secondary | ICD-10-CM | POA: Diagnosis not present

## 2023-02-13 DIAGNOSIS — I10 Essential (primary) hypertension: Secondary | ICD-10-CM | POA: Diagnosis not present

## 2023-02-13 DIAGNOSIS — Z92241 Personal history of systemic steroid therapy: Secondary | ICD-10-CM | POA: Insufficient documentation

## 2023-02-13 DIAGNOSIS — M5137 Other intervertebral disc degeneration, lumbosacral region: Secondary | ICD-10-CM | POA: Diagnosis not present

## 2023-02-13 DIAGNOSIS — E039 Hypothyroidism, unspecified: Secondary | ICD-10-CM | POA: Diagnosis not present

## 2023-02-13 HISTORY — DX: Personal history of systemic steroid therapy: Z92.241

## 2023-02-13 HISTORY — DX: Personal history of other endocrine, nutritional and metabolic disease: Z86.39

## 2023-02-13 NOTE — Progress Notes (Unsigned)
Cardiology Office Note:    Date:  02/14/2023   ID:  Karen Allison 10-09-1945, MRN [id number]  PCP:  Philemon Kingdom, MD  Cardiologist:  Norman Herrlich, MD   Referring MD: Dellia Beckwith, MD  ASSESSMENT:    1. Paroxysmal atrial fibrillation (HCC)   2. Primary hypertension   3. Hyponatremia   4. Malignant neoplasm of left breast in female, estrogen receptor positive, unspecified site of breast (HCC)    PLAN:    In order of problems listed above:  She continues in atrial fibrillation rate controlled will take her beta-blocker once daily and again not anticoagulated with frequent falls due to neuropathy and gait dysfunction I am unsure what to do with hypertension she is checking her blood pressure sporadically have asked her to use a validated device good technique check her blood pressure after resting with proper posture for 1 week send me a list of blood pressures and I will make a decision at that point Hyponatremia is improved Nephrology raise the issue of whether she was having chemotherapy related toxicity as send no indication and will recheck an echocardiogram in our office looking particularly at GLS  Next appointment   Medication Adjustments/Labs and Tests Ordered: Current medicines are reviewed at length with the patient today.  Concerns regarding medicines are outlined above.  No orders of the defined types were placed in this encounter.  No orders of the defined types were placed in this encounter.   Follow-up from hospital atrial fibrillation severe hyponatremia  History of Present Illness:    Karen Allison is a 77 y.o. female who is being seen today for the evaluation of atrial fibrillation at the request of Dellia Beckwith, MD.  She was seen [facility] 01/13/2023 for atrial fibrillation hemoglobin was normal at 12.1 creatinine 0.4 potassium she was in persistent atrial fibrillation rate controlled with a beta-blocker and  she was not anticoagulated because of concerns for fall and risk and was not considered to be a candidate for cardioversion.  Her discharge diagnosis was bacterial pneumonia and hyponatremia serum sodium nadir was 110 during the hospitalization CT of the chest had patchy groundglass opacities and tree-in-bud nodularity throughout both lungs.  She had subacute nondisplaced left-sided rib fractures.  Echocardiogram showed preserved ejection fraction EF 55 to 60% other findings an echocardiogram was normal right and left atrial size no significant valvular abnormality right ventricle normal size and function.  Repeat labs 01/24/2023 sodium 1 creatinine 0.77 hemoglobin 11.9  Home heart rates around 60 to 80 bpm blood pressure is erratic and often in the 150 or greater range Past Medical History:  Diagnosis Date   Arthritis    Blood dyscrasia    bleeds and bruises easily   Breast cancer (HCC)    left   Gait disorder 07/05/2016   Hepatitis     Hep A years ago   Hypertension    Hypothyroidism    Graves disease, age 67 yo   Nose trouble    right nostril will hemorrhage at times   Pneumonia    Rosacea    Secondary malignant neoplasm of liver [facility])     Past Surgical History:  Procedure Laterality Date   ABDOMINAL HYSTERECTOMY     Age 37   ANTERIOR CERVICAL DECOMP/DISCECTOMY FUSION N/A 05/06/2014   Procedure: ANTERIOR CERVICAL DISCECTOMY FUSION C3-4 with transgraft, local bone graft, plate and screws;  Surgeon: Kerrin Champagne, MD;  Location: [facility] OR;  Service: Orthopedics;  Laterality: N/A;  APPENDECTOMY     BREAST LUMPECTOMY Left    COLONOSCOPY     EYE SURGERY Bilateral    cataracts   LUMBAR SPINE SURGERY  2011   Removal of hematoma   ORIF HUMERUS FRACTURE Left 2013   THORACIC SPINE SURGERY  2011   Bone cages   THYROIDECTOMY     TONSILLECTOMY      Current Medications: Current Meds  Medication Sig   aspirin EC 81 MG tablet Take 1 tablet by mouth daily.   B Complex Vitamins (B  COMPLEX PO) Take by mouth.   Calcium Carbonate-Vitamin D (CALTRATE 600+D PO) Take 1 tablet by mouth 2 (two) times daily.   celecoxib (CELEBREX) 200 MG capsule Take 200 mg by mouth daily.    cholecalciferol (VITAMIN D3) 25 MCG (1000 UNIT) tablet 1 tablet   diazepam (VALIUM) 10 MG tablet Take 10 mg by mouth at bedtime.    docusate sodium (COLACE) 100 MG capsule Take 200 mg by mouth 2 (two) times daily as needed for mild constipation.    doxycycline (VIBRAMYCIN) 50 MG capsule Take 50 mg by mouth daily.   FLUAD 0.5 ML SUSY See admin instructions.   fluconazole (DIFLUCAN) 150 MG tablet Take 150 mg by mouth once.   furosemide (LASIX) 20 MG tablet Take 20-40 mg by mouth daily as needed.   gabapentin (NEURONTIN) 600 MG tablet Take 600-1,200 mg by mouth 4 (four) times daily - after meals and at bedtime. 1 tablet (600 mg) 3 times a day and 2 tablets(1200mg ) at bedtime   HYDROcodone-acetaminophen (NORCO) 10-325 MG tablet Take 1 tablet by mouth every 6 (six) hours as needed.   levothyroxine (SYNTHROID, LEVOTHROID) 50 MCG tablet Take 50 mcg by mouth daily.   methocarbamol (ROBAXIN) 500 MG tablet Take 500 mg by mouth 4 (four) times daily.   metoprolol succinate (TOPROL-XL) 25 MG 24 hr tablet Take 25 mg by mouth daily.   metroNIDAZOLE (METROCREAM) 0.75 % cream Apply topically.   Multiple Vitamin (MULTIVITAMIN WITH MINERALS) TABS Take 1 tablet by mouth daily.   omeprazole (PRILOSEC) 20 MG capsule Take 1 capsule (20 mg total) by mouth 2 (two) times daily before a meal.   ondansetron (ZOFRAN) 8 MG tablet Take 8 mg by mouth 2 (two) times daily as needed.   rosuvastatin (CRESTOR) 10 MG tablet Take 10 mg by mouth once a week.   sennosides-docusate sodium (SENOKOT-S) 8.6-50 MG tablet Take 1 tablet by mouth daily.   trastuzumab (HERCEPTIN) 440 MG injection 440 mg by Intravenous (Continuous Infusion) route every 21 ( twenty-one) days. infusion every 3 weeks     Allergies:   Cefuroxime and Zithromax [azithromycin]    Social History   Socioeconomic History   Marital status: Married    Spouse name: Not on file   Number of children: 1   Years of education: 14   Highest education level: Not on file  Occupational History   Occupation: Retired  Tobacco Use   Smoking status: Former    Types: Cigarettes    Quit date: 04/23/1962    Years since quitting: 60.8   Smokeless tobacco: Never   Tobacco comments:    as teenager  Substance and Sexual Activity   Alcohol use: Yes    Comment: Social   Drug use: No   Sexual activity: Not on file  Other Topics Concern   Not on file  Social History Narrative   Lives at home w/ her husband   Right-handed   Caffeine: 2 cups in  the morning   Social Determinants of Health   Financial Resource Strain: Not on file  Food Insecurity: Not on file  Transportation Needs: Not on file  Physical Activity: Not on file  Stress: Not on file  Social Connections: Not on file      EKGs/Labs/Other Studies Reviewed:    The following studies were reviewed today:   Cardiac Studies & Procedures       ECHOCARDIOGRAM  ECHOCARDIOGRAM COMPLETE 09/05/2022  Narrative ECHOCARDIOGRAM REPORT    Patient Name:   SHAROLYN WEBER [facility] Date of Exam: 09/05/2022 Medical Rec #:  [id number]             Height:       60.0 in Accession #:    [id number]            Weight:       136.1 lb Date of Birth:  1946/04/06              BSA:          1.585 m Patient Age:    76 years              BP:           167/65 mmHg Patient Gender: F                     HR:           54 bpm. Exam Location:  [facility]  Procedure: 2D Echo, Cardiac Doppler, Color Doppler and Strain Analysis  Indications:    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HCC) [C78.7], Adverse effect of multiple unspecified drugs, medicaments and biological substances, sequela [T50.915S]  History:        Patient has prior history of Echocardiogram examinations, most recent 10/14/2021. Acute respiratory failure with  hypoxia, Signs/Symptoms:Secondary malignant neoplasm of liver; Risk Factors:Hypertension.  Sonographer:    Louie Boston RDCS Referring Phys: [id number] CHRISTINE H MCCARTY  IMPRESSIONS   1. Left ventricular ejection fraction, by estimation, is 55 to 60%. The left ventricle has normal function. The left ventricle has no regional wall motion abnormalities. There is mild concentric left ventricular hypertrophy. Left ventricular diastolic parameters are consistent with Grade III diastolic dysfunction (restrictive). Elevated left atrial pressure. 2. Right ventricular systolic function is normal. The right ventricular size is normal. There is mildly elevated pulmonary artery systolic pressure. 3. The mitral valve is normal in structure. Mild mitral valve regurgitation. No evidence of mitral stenosis. 4. The aortic valve is normal in structure. Aortic valve regurgitation is mild. Aortic valve sclerosis is present, with no evidence of aortic valve stenosis. 5. Aortic Normal DTA. 6. The inferior vena cava is normal in size with greater than 50% respiratory variability, suggesting right atrial pressure of 3 mmHg.  FINDINGS Left Ventricle: Left ventricular ejection fraction, by estimation, is 55 to 60%. The left ventricle has normal function. The left ventricle has no regional wall motion abnormalities. Global longitudinal strain performed but not reported based on interpreter judgement due to suboptimal tracking. The left ventricular internal cavity size was normal in size. There is mild concentric left ventricular hypertrophy. Left ventricular diastolic parameters are consistent with Grade III diastolic dysfunction (restrictive). Elevated left atrial pressure.  Right Ventricle: The right ventricular size is normal. No increase in right ventricular wall thickness. Right ventricular systolic function is normal. There is mildly elevated pulmonary artery systolic pressure. The tricuspid regurgitant velocity is  2.97 m/s, and with an assumed right atrial pressure of 3 mmHg,  the estimated right ventricular systolic pressure is 38.3 mmHg.  Left Atrium: Left atrial size was normal in size.  Right Atrium: Right atrial size was normal in size.  Pericardium: There is no evidence of pericardial effusion.  Mitral Valve: The mitral valve is normal in structure. Mild mitral valve regurgitation. No evidence of mitral valve stenosis.  Tricuspid Valve: The tricuspid valve is normal in structure. Tricuspid valve regurgitation is not demonstrated. No evidence of tricuspid stenosis.  Aortic Valve: The aortic valve is normal in structure. Aortic valve regurgitation is mild. Aortic valve sclerosis is present, with no evidence of aortic valve stenosis.  Pulmonic Valve: The pulmonic valve was normal in structure. Pulmonic valve regurgitation is not visualized. No evidence of pulmonic stenosis.  Aorta: The aortic arch was not well visualized, Normal DTA and the aortic root and ascending aorta are structurally normal, with no evidence of dilitation.  Venous: A systolic blunting flow pattern is recorded from the right upper pulmonary vein. The inferior vena cava is normal in size with greater than 50% respiratory variability, suggesting right atrial pressure of 3 mmHg.  IAS/Shunts: No atrial level shunt detected by color flow Doppler.   LEFT VENTRICLE PLAX 2D LVIDd:         3.90 cm     Diastology LVIDs:         2.80 cm     LV e' medial:    5.57 cm/s LV PW:         1.30 cm     LV E/e' medial:  23.2 LV IVS:        1.30 cm     LV e' lateral:   6.53 cm/s LVOT diam:     2.00 cm     LV E/e' lateral: 19.8 LV SV:         46 LV SV Index:   29 LVOT Area:     3.14 cm  LV Volumes (MOD) LV vol d, MOD A2C: 61.4 ml LV vol d, MOD A4C: 40.5 ml LV vol s, MOD A2C: 25.4 ml LV vol s, MOD A4C: 20.7 ml LV SV MOD A2C:     36.0 ml LV SV MOD A4C:     40.5 ml LV SV MOD BP:      27.9 ml  RIGHT VENTRICLE            IVC RV S  prime:     8.84 cm/s  IVC diam: 1.40 cm TAPSE (M-mode): 1.8 cm  LEFT ATRIUM           Index        RIGHT ATRIUM           Index LA diam:      3.40 cm 2.15 cm/m   RA Area:     10.20 cm LA Vol (A2C): 35.2 ml 22.21 ml/m  RA Volume:   18.40 ml  11.61 ml/m LA Vol (A4C): 31.2 ml 19.68 ml/m AORTIC VALVE             PULMONIC VALVE LVOT Vmax:   64.00 cm/s  PR End Diast Vel: 3.93 msec LVOT Vmean:  41.300 cm/s LVOT VTI:    0.145 m  AORTA Ao Root diam: 3.00 cm Ao Asc diam:  3.00 cm Ao Desc diam: 2.60 cm  MITRAL VALVE                TRICUSPID VALVE MV Area (PHT): 3.37 cm     TR Peak grad:   35.3 mmHg  MV Decel Time: 225 msec     TR Vmax:        297.00 cm/s MV E velocity: 129.00 cm/s MV A velocity: 50.30 cm/s   SHUNTS MV E/A ratio:  2.56         Systemic VTI:  0.14 m Systemic Diam: 2.00 cm  Norman Herrlich MD Electronically signed by Norman Herrlich MD Signature Date/Time: 09/05/2022/1:40:27 PM    Final             EKG:  EKG is  ordered today.  The ekg ordered today is personally reviewed and demonstrates atrial fibrillation with a controlled ventricular response  Recent Labs: Serum sodium was 134 in the last week 07/28/2022: TSH 3.06 01/24/2023: ALT 15; BUN 13; Creatinine 0.77; Hemoglobin 11.9; Platelet Count 234; Potassium 4.0; Sodium 129  Recent Lipid Panel No results found for: "CHOL", "TRIG", "HDL", "CHOLHDL", "VLDL", "LDLCALC", "LDLDIRECT"  Physical Exam:    VS:  BP 134/84   Pulse 75   Ht 5' (1.524 m)   Wt 132 lb 9.6 oz (60.1 kg)   SpO2 99%   BMI 25.90 kg/m     Wt Readings from Last 3 Encounters:  02/14/23 132 lb 9.6 oz (60.1 kg)  01/27/23 132 lb (59.9 kg)  01/24/23 132 lb 12.8 oz (60.2 kg)     GEN:  Well nourished, well developed in no acute distress HEENT: Normal NECK: No JVD; No carotid bruits LYMPHATICS: No lymphadenopathy CARDIAC: Irregular rate and rhythm RRR, no murmurs, rubs, gallops RESPIRATORY:  Clear to auscultation without rales, wheezing or rhonchi   ABDOMEN: Soft, non-tender, non-distended MUSCULOSKELETAL:  No edema; No deformity  SKIN: Warm and dry NEUROLOGIC:  Alert and oriented x 3 PSYCHIATRIC:  Normal affect     Signed, Norman Herrlich, MD  02/14/2023 3:21 PM    [facility]

## 2023-02-14 ENCOUNTER — Ambulatory Visit: Payer: Medicare Other | Attending: Cardiology | Admitting: Cardiology

## 2023-02-14 ENCOUNTER — Encounter: Payer: Self-pay | Admitting: Oncology

## 2023-02-14 ENCOUNTER — Encounter: Payer: Self-pay | Admitting: Cardiology

## 2023-02-14 VITALS — BP 134/84 | HR 75 | Ht 60.0 in | Wt 132.6 lb

## 2023-02-14 DIAGNOSIS — I10 Essential (primary) hypertension: Secondary | ICD-10-CM | POA: Diagnosis not present

## 2023-02-14 DIAGNOSIS — I480 Paroxysmal atrial fibrillation: Secondary | ICD-10-CM | POA: Diagnosis not present

## 2023-02-14 DIAGNOSIS — E871 Hypo-osmolality and hyponatremia: Secondary | ICD-10-CM | POA: Diagnosis not present

## 2023-02-14 DIAGNOSIS — C50912 Malignant neoplasm of unspecified site of left female breast: Secondary | ICD-10-CM | POA: Insufficient documentation

## 2023-02-14 DIAGNOSIS — Z170 Estrogen receptor positive status [ER+]: Secondary | ICD-10-CM | POA: Insufficient documentation

## 2023-02-14 MED ORDER — METOPROLOL SUCCINATE ER 25 MG PO TB24: 25.0000 mg | ORAL_TABLET | Freq: Every day | ORAL | 3 refills | Status: DC

## 2023-02-14 NOTE — Addendum Note (Signed)
Addended by: Roxanne Mins I on: 02/14/2023 03:50 PM   Modules accepted: Orders

## 2023-02-14 NOTE — Patient Instructions (Addendum)
Medication Instructions:  Your physician recommends that you continue on your current medications as directed. Please refer to the Current Medication list given to you today.  *If you need a refill on your cardiac medications before your next appointment, please call your pharmacy*   Lab Work: None If you have labs (blood work) drawn today and your tests are completely normal, you will receive your results only by: MyChart Message (if you have MyChart) OR A paper copy in the mail If you have any lab test that is abnormal or we need to change your treatment, we will call you to review the results.   Testing/Procedures: None   Follow-Up: At [facility], you and your health needs are our priority.  As part of our continuing mission to provide you with exceptional heart care, we have created designated Provider Care Teams.  These Care Teams include your primary Cardiologist (physician) and Advanced Practice Providers (APPs -  Physician Assistants and Nurse Practitioners) who all work together to provide you with the care you need, when you need it.  We recommend signing up for the patient portal called "MyChart".  Sign up information is provided on this After Visit Summary.  MyChart is used to connect with patients for Virtual Visits (Telemedicine).  Patients are able to view lab/test results, encounter notes, upcoming appointments, etc.  Non-urgent messages can be sent to your provider as well.   To learn more about what you can do with MyChart, go to [email or web address].    Your next appointment:   3 month(s)  Provider:   Norman Herrlich, MD    Other Instructions Check your blood pressure daily and send Dr. Dulce Sellar a list of blood pressures in 1 week.   [facility]  Tips to measure your blood pressure correctly  To determine whether you have hypertension, a medical professional will take a blood pressure reading. How you prepare for the test, the position of your arm, and  other factors can change a blood pressure reading by 10% or more. That could be enough to hide high blood pressure, start you on a drug you don't really need, or lead your doctor to incorrectly adjust your medications. National and international guidelines offer specific instructions for measuring blood pressure. If a doctor, nurse, or medical assistant isn't doing it right, don't hesitate to ask him or her to get with the guidelines. Here's what you can do to ensure a correct reading:  Don't drink a caffeinated beverage or smoke during the 30 minutes before the test.  Sit quietly for five minutes before the test begins.  During the measurement, sit in a chair with your feet on the floor and your arm supported so your elbow is at about heart level.  The inflatable part of the cuff should completely cover at least 80% of your upper arm, and the cuff should be placed on bare skin, not over a shirt.  Don't talk during the measurement.  Have your blood pressure measured twice, with a brief break in between. If the readings are different by 5 points or more, have it done a third time. There are times to break these rules. If you sometimes feel lightheaded when getting out of bed in the morning or when you stand after sitting, you should have your blood pressure checked while seated and then while standing to see if it falls from one position to the next. Because blood pressure varies throughout the day, your doctor will rarely diagnose hypertension on the  basis of a single reading. Instead, he or she will want to confirm the measurements on at least two occasions, usually within a few weeks of one another. The exception to this rule is if you have a blood pressure reading of 180/110 mm Hg or higher. A result this high usually calls for prompt treatment. It's also a good idea to have your blood pressure measured in both arms at least once, since the reading in one arm (usually the right) may be higher than that  in the left. A 2014 study in The American Journal of Medicine of nearly 3,400 people found average arm- to-arm differences in systolic blood pressure of about 5 points. The higher number should be used to make treatment decisions. In 2017, new guidelines from the American Heart Association, the [employer] of Cardiology, and nine other health organizations lowered the diagnosis of high blood pressure to 130/80 mm Hg or higher for all adults. The guidelines also redefined the various blood pressure categories to now include normal, elevated, Stage 1 hypertension, Stage 2 hypertension, and hypertensive crisis (see "Blood pressure categories"). Blood pressure categories  Blood pressure category SYSTOLIC (upper number)  DIASTOLIC (lower number)  Normal Less than 120 mm Hg and Less than 80 mm Hg  Elevated 120-129 mm Hg and Less than 80 mm Hg  High blood pressure: Stage 1 hypertension 130-139 mm Hg or 80-89 mm Hg  High blood pressure: Stage 2 hypertension 140 mm Hg or higher or 90 mm Hg or higher  Hypertensive crisis (consult your doctor immediately) Higher than 180 mm Hg and/or Higher than 120 mm Hg  Source: American Heart Association and American Stroke Association. For more on getting your blood pressure under control, buy Controlling Your Blood Pressure, a Special Health Report from [facility].

## 2023-02-16 ENCOUNTER — Other Ambulatory Visit: Payer: Self-pay | Admitting: Oncology

## 2023-02-16 ENCOUNTER — Encounter: Payer: Self-pay | Admitting: *Deleted

## 2023-02-16 DIAGNOSIS — C50912 Malignant neoplasm of unspecified site of left female breast: Secondary | ICD-10-CM

## 2023-02-17 ENCOUNTER — Inpatient Hospital Stay: Payer: Medicare Other

## 2023-02-17 ENCOUNTER — Ambulatory Visit: Payer: Medicare Other

## 2023-02-17 VITALS — BP 170/73 | HR 74 | Temp 97.8°F | Resp 18 | Ht 60.0 in | Wt 132.2 lb

## 2023-02-17 DIAGNOSIS — M2550 Pain in unspecified joint: Secondary | ICD-10-CM | POA: Diagnosis not present

## 2023-02-17 DIAGNOSIS — Z5112 Encounter for antineoplastic immunotherapy: Secondary | ICD-10-CM | POA: Diagnosis not present

## 2023-02-17 DIAGNOSIS — C50912 Malignant neoplasm of unspecified site of left female breast: Secondary | ICD-10-CM | POA: Diagnosis not present

## 2023-02-17 DIAGNOSIS — Z170 Estrogen receptor positive status [ER+]: Secondary | ICD-10-CM | POA: Diagnosis not present

## 2023-02-17 DIAGNOSIS — C7800 Secondary malignant neoplasm of unspecified lung: Secondary | ICD-10-CM | POA: Diagnosis not present

## 2023-02-17 DIAGNOSIS — C787 Secondary malignant neoplasm of liver and intrahepatic bile duct: Secondary | ICD-10-CM | POA: Diagnosis not present

## 2023-02-17 MED ORDER — TRASTUZUMAB-ANNS CHEMO 150 MG IV SOLR
6.0000 mg/kg | Freq: Once | INTRAVENOUS | Status: AC
  Administered 2023-02-17: 378 mg via INTRAVENOUS
  Filled 2023-02-17: qty 18

## 2023-02-17 MED ORDER — SODIUM CHLORIDE 0.9 % IV SOLN: Freq: Once | INTRAVENOUS | Status: AC

## 2023-02-17 NOTE — Patient Instructions (Signed)
[facility] [place]  Discharge Instructions: Thank you for choosing [facility] to provide your oncology and hematology care.  If you have a lab appointment with the Cancer Center, please go directly to the Cancer Center and check in at the registration area.   Wear comfortable clothing and clothing appropriate for easy access to any Portacath or PICC line.   We strive to give you quality time with your provider. You may need to reschedule your appointment if you arrive late (15 or more minutes).  Arriving late affects you and other patients whose appointments are after yours.  Also, if you miss three or more appointments without notifying the office, you may be dismissed from the clinic at the provider's discretion.      For prescription refill requests, have your pharmacy contact our office and allow 72 hours for refills to be completed.    Today you received the following chemotherapy and/or immunotherapy agents kanjinti      To help prevent nausea and vomiting after your treatment, we encourage you to take your nausea medication as directed.  BELOW ARE SYMPTOMS THAT SHOULD BE REPORTED IMMEDIATELY: *FEVER GREATER THAN 100.4 F (38 C) OR HIGHER *CHILLS OR SWEATING *NAUSEA AND VOMITING THAT IS NOT CONTROLLED WITH YOUR NAUSEA MEDICATION *UNUSUAL SHORTNESS OF BREATH *UNUSUAL BRUISING OR BLEEDING *URINARY PROBLEMS (pain or burning when urinating, or frequent urination) *BOWEL PROBLEMS (unusual diarrhea, constipation, pain near the anus) TENDERNESS IN MOUTH AND THROAT WITH OR WITHOUT PRESENCE OF ULCERS (sore throat, sores in mouth, or a toothache) UNUSUAL RASH, SWELLING OR PAIN  UNUSUAL VAGINAL DISCHARGE OR ITCHING   Items with * indicate a potential emergency and should be followed up as soon as possible or go to the Emergency Department if any problems should occur.  Please show the CHEMOTHERAPY ALERT CARD or IMMUNOTHERAPY ALERT CARD at check-in to the  Emergency Department and triage nurse.  Should you have questions after your visit or need to cancel or reschedule your appointment, please contact [facility] [place]  Dept: [phone]  and follow the prompts.  Office hours are 8:00 a.m. to 4:30 p.m. Monday - Friday. Please note that voicemails left after 4:00 p.m. may not be returned until the following business day.  We are closed weekends and major holidays. You have access to a nurse at all times for urgent questions. Please call the main number to the clinic Dept: [phone] and follow the prompts.  For any non-urgent questions, you may also contact your provider using MyChart. We now offer e-Visits for anyone 18 and older to request care online for non-urgent symptoms. For details visit [email or web address].   Also download the MyChart app! Go to the app store, search "MyChart", open the app, select , and log in with your MyChart username and password.   

## 2023-02-20 ENCOUNTER — Encounter: Payer: Self-pay | Admitting: Oncology

## 2023-02-21 ENCOUNTER — Encounter: Payer: Self-pay | Admitting: Oncology

## 2023-02-24 ENCOUNTER — Ambulatory Visit: Payer: Medicare Other

## 2023-02-27 ENCOUNTER — Encounter: Payer: Self-pay | Admitting: Cardiology

## 2023-03-03 NOTE — Progress Notes (Signed)
[facility]  [address] [place],  [place]  [address] [phone]  Clinic Day:  03/09/23  Referring physician: Philemon Kingdom, MD  CHIEF COMPLAINT:  CC: Stage IV breast cancer  Current Treatment:  Trastuzumab IV every 3 weeks  HISTORY OF PRESENT ILLNESS:  Karen Allison is a 77 y.o. 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  Estrogen and progesterone receptors were positive and HER2 was positive, and scans revealed liver metastases.  She was placed on a clinical trial at [facility] consisting of letrozole and trastuzumab, but she stopped the letrozole after several months because of severe arthralgias.  She also has known osteoarthritis, degenerative disc disease, and osteoporosis, and has had several back surgeries.  She is been on single agent trastuzumab now for over 12 years with good control of her disease.  This was stopped temporarily in 2011 but a PET scan a few months later revealed a new 9 mm lung nodule felt to represent recurrent disease, which did resolve after she was placed back on the trastuzumab.  She sees Dr. Costella Hatcher at [facility] yearly now, usually in July and he does scans and labs at that time.  We do perform periodic echocardiograms and her last one was in July.  She did have her last bone density scan in July of 2015, with no change, and receives Reclast every 6 months.  She had cervical disc surgery in September of 2016, and then developed a hospital-acquired pneumonia which took a long time to clear.  She had colonoscopy within the last 2 years.  She had a fall in September with 2 fractured ribs.  Her medication list was reviewed.  When I review her Reclast doses, she received this every 6 months from July of 2014 to January of 2016, and then yearly, with the last dose in January of 2017.  She was having some  tooth sensitivity but this is no longer an issue.  I did review her family history and the patient had endometrial cancer in her 43's and breast cancer in her 69's. She has a first cousin who had breast cancer and a paternal grandmother who had liver cancer, possibly metastatic.  An aunt had liver cancer.  A step uncle had multiple daughters with breast cancer.  The patient was seen in November of 2017 because of a tenderness and possible lump in the right upper outer quadrant.  A mammogram and ultrasound just revealed normal fibro fatty tissue.  She was evaluated at [facility] by Dr. Costella Hatcher in July of 2017 and there were some concerns about her scans.  There appeared to be a rim enhancing fluid collection intramuscular in the left vastus lateralis muscle measuring 2 cm which appeared new.  There was a new right posterior 6th rib expansile sclerotic lesion measuring 2.3 cm as well as an old healed right 5th rib fracture which had been stable from the year before.  She did have an MRI of the hip in August which was nonspecific with extensive postoperative findings in the lumbar spine as well as osteoarthritis, osteophytes, and degenerative changes.  There was a simple appearing fluid collection interposed between the iliotibial band and proximal margin of the vastus lateralis consistent with a small ganglion cyst or prior shearing injury.  We repeated the CT of the chest in January of 2018 to follow up  on the expansile lesion of the rib and this remained stable, and consistent with posttraumatic fracture.  She also fractured her right scapula after a fall and she has fractured several ribs on the right.  Her last ECHO in March 2022 remains normal, with an EF of 55 to 60%.  INTERVAL HISTORY:  Karen Allison is here for a follow up for stage IV breast cancer and is on maintenance Herceptin for many years. Patient states that she feels well and complains of numbness in her shoulder and leg pain rating 4/10. She continues  Herceptin without difficulty and will continue this every 3 weeks. Her day 1 cycle 47 of Trastuzumab is scheduled on 03/10/2023. She is here for yearly restaging, and had a CT scan done on 03/08/2023 that revealed no evidence of recurrent or metastatic disease in the chest, abdomen, or pelvis, improved, although persistent heterogeneous and ground glass airspace opacity throughout the dependent lower lobes, consistent with improved infection or aspiration. Her colon is fluid filled to the rectum with diffuse mucosal hyper-enhancement of descending colon, sigmoid, and rectum which are consistent with nonspecific colitis. She had used laxatives the day before and I think this is the explanation for those findings, as she has no symptoms. I had just seen her last month after she was hospitalized and the nephrologist has told her she has cardiac damage from her "chemotherapy". However, her echocardiogram done in May when she was hospitalized looked good to me. She did have severe hyponatremia during her hospital stay, down to 113 and had pneumonia. I have contacted Dr. Dulce Sellar and he recommends a repeat echocardiogram at [facility] along with GLS measurements I will get that scheduled. The patient asked me why they are describing cardiomegaly on her CT scan and I am not sure why, as this has not been a known issue. The echocardiogram from May describes normal size of both left and right ventricles. Patient informed me that she has been having issues with urinary retention and has an appointment with a urogynecologist for further evaluation. We will continue her trastuzumab every 3 weeks as usual.  I will see her back in 6 months with CBC and CMP, we will most likely repeat an echocardiogram at that time. If all is well, we will wait 1 year to repeat scans again.   She denies signs of infection such as sore throat, sinus drainage, cough, or urinary symptoms.  She denies fevers or recurrent chills. She denies pain.  She denies nausea, vomiting, chest pain, dyspnea or cough. Her appetite is good and her weight has increased 1 pounds over last 3 weeks .  REVIEW OF SYSTEMS:  Review of Systems  Constitutional: Negative.  Negative for appetite change, chills, diaphoresis, fatigue, fever and unexpected weight change.  HENT:  Negative.  Negative for hearing loss, lump/mass, mouth sores, nosebleeds, sore throat, tinnitus, trouble swallowing and voice change.   Eyes: Negative.  Negative for eye problems and icterus.  Respiratory: Negative.  Negative for chest tightness, cough, hemoptysis, shortness of breath and wheezing.   Cardiovascular: Negative.  Negative for chest pain, leg swelling and palpitations.  Gastrointestinal: Negative.  Negative for abdominal distention, abdominal pain, blood in stool, constipation, diarrhea, nausea, rectal pain and vomiting.  Endocrine: Negative.   Genitourinary: Negative.  Negative for bladder incontinence, difficulty urinating, dyspareunia, dysuria, frequency, hematuria, menstrual problem, nocturia, pelvic pain, vaginal bleeding and vaginal discharge.   Musculoskeletal:  Positive for back pain and gait problem (using a walker). Negative for arthralgias, flank pain,  myalgias, neck pain and neck stiffness.       She has osteoporosis with many prior fractures  Skin: Negative.  Negative for itching, rash and wound.  Neurological:  Positive for gait problem (using a walker) and headaches. Negative for dizziness, extremity weakness, light-headedness, numbness, seizures and speech difficulty.  Hematological: Negative.  Negative for adenopathy. Does not bruise/bleed easily.  Psychiatric/Behavioral:  Positive for depression. Negative for confusion, decreased concentration, sleep disturbance and suicidal ideas. The patient is nervous/anxious.      VITALS:  Blood pressure 128/70, pulse 68, temperature 97.7 F (36.5 C), temperature source Oral, resp. rate 16, height 5' 0.2" (1.529 m), weight  133 lb 8 oz (60.6 kg), SpO2 99%.  Wt Readings from Last 3 Encounters:  03/10/23 134 lb (60.8 kg)  03/09/23 133 lb 8 oz (60.6 kg)  03/07/23 139 lb 3.2 oz (63.1 kg)    Body mass index is 25.9 kg/m.  Performance status (ECOG): 1 - Symptomatic but completely ambulatory  PHYSICAL EXAM:  Physical Exam Vitals and nursing note reviewed.  Constitutional:      General: She is not in acute distress.    Appearance: Normal appearance. She is normal weight. She is not ill-appearing, toxic-appearing or diaphoretic.  HENT:     Head: Normocephalic and atraumatic.     Right Ear: Tympanic membrane, ear canal and external ear normal. There is no impacted cerumen.     Left Ear: Tympanic membrane, ear canal and external ear normal. There is no impacted cerumen.     Nose: Nose normal. No congestion or rhinorrhea.     Mouth/Throat:     Mouth: Mucous membranes are moist.     Pharynx: Oropharynx is clear. No oropharyngeal exudate or posterior oropharyngeal erythema.  Eyes:     General: No scleral icterus.       Right eye: No discharge.        Left eye: No discharge.     Extraocular Movements: Extraocular movements intact.     Conjunctiva/sclera: Conjunctivae normal.     Pupils: Pupils are equal, round, and reactive to light.  Neck:     Vascular: No carotid bruit.  Cardiovascular:     Rate and Rhythm: Normal rate and regular rhythm.     Pulses: Normal pulses.     Heart sounds: Normal heart sounds. No murmur heard.    No friction rub. No gallop.  Pulmonary:     Effort: Pulmonary effort is normal. No respiratory distress.     Breath sounds: No stridor. No wheezing, rhonchi or rales.  Chest:     Chest wall: No mass or tenderness.  Breasts:    Right: Normal. No mass.     Left: Normal. No mass.     Comments: She has a well healed scar in the left axillary area which is well healed. Both breasts are without masses. Abdominal:     General: Bowel sounds are normal. There is no distension.      Palpations: Abdomen is soft. There is no hepatomegaly, splenomegaly or mass.     Tenderness: There is no abdominal tenderness. There is no right CVA tenderness, left CVA tenderness, guarding or rebound.     Hernia: No hernia is present.  Musculoskeletal:        General: No swelling, tenderness, deformity or signs of injury. Normal range of motion.     Cervical back: Normal range of motion and neck supple. No rigidity or tenderness.     Right lower leg: No edema.  Left lower leg: No edema.  Lymphadenopathy:     Cervical: No cervical adenopathy.  Skin:    General: Skin is warm and dry.     Coloration: Skin is not jaundiced or pale.     Findings: No bruising, erythema, lesion or rash.  Neurological:     General: No focal deficit present.     Mental Status: She is alert and oriented to person, place, and time. Mental status is at baseline.     Cranial Nerves: No cranial nerve deficit.     Sensory: No sensory deficit.     Motor: No weakness.     Coordination: Coordination normal.     Gait: Gait normal.     Deep Tendon Reflexes: Reflexes normal.  Psychiatric:        Mood and Affect: Mood normal.        Behavior: Behavior normal.        Thought Content: Thought content normal.        Judgment: Judgment normal.     LABS:      Latest Ref Rng & Units 01/24/2023    3:23 PM 08/30/2022   11:50 AM 07/28/2022   12:00 AM  CBC  WBC 4.0 - 10.5 K/uL 5.3  6.6  6.8      Hemoglobin 12.0 - 15.0 g/dL 09.8  11.9  14.7      Hematocrit 36.0 - 46.0 % 35.8  39.8  35      Platelets 150 - 400 K/uL 234  183  189         This result is from an external source.      Latest Ref Rng & Units 03/08/2023   12:00 AM 01/24/2023    3:23 PM 08/30/2022   11:50 AM  CMP  Glucose 70 - 99 mg/dL  829  562   BUN 4 - 21 10     13  13    Creatinine 0.5 - 1.1 0.7     0.77  0.73   Sodium 135 - 145 mmol/L  129  135   Potassium 3.5 - 5.1 mmol/L  4.0  4.7   Chloride 98 - 111 mmol/L  95  98   CO2 22 - 32 mmol/L  26  28    Calcium 8.9 - 10.3 mg/dL  8.6  9.0   Total Protein 6.5 - 8.1 g/dL  6.9  7.5   Total Bilirubin 0.3 - 1.2 mg/dL  0.5  0.5   Alkaline Phos 38 - 126 U/L  74  86   AST 15 - 41 U/L  22  25   ALT 0 - 44 U/L  15  16      This result is from an external source.   Lab Results  Component Value Date   LDH 230 03/10/2006   STUDIES:  Exam: 03/08/2023 CT Chest, Abdomen, and Pelvis with Contrast Impression: No evidence of recurrent or metastatic disease in the chest, abdomen, or pelvis.  Improved although persistent heterogeneous and ground glass airspace opacity throughout the dependent lower lobes, consistent with improved infection or aspiration. Colon is fluid filled to the rectum with diffuse mucosal hyper-enhancement of descending colon, sigmoid, and rectum. Findings are consistent with nonspecific colitis.  Cardiomegaly and Coronary artery disease.  Aortic Atherosclerosis (ICD10-170.)    EXAM: 01/04/2023 DIGITAL SCREENING BILATERAL MAMMOGRAM WITH TOMOSYNTHESIS AND CAD IMPRESSION: No mammographic evidence of malignancy. A result letter of this screening mammogram will be mailed directly to the patient.  Allergies:  Allergies  Allergen Reactions   Cefuroxime Hives   Zithromax [Azithromycin] Hives    Current Medications: Current Outpatient Medications  Medication Sig Dispense Refill   aspirin EC 81 MG tablet Take 1 tablet by mouth daily.     B Complex Vitamins (B COMPLEX PO) Take by mouth.     Calcium Carbonate-Vitamin D (CALTRATE 600+D PO) Take 1 tablet by mouth 2 (two) times daily.     celecoxib (CELEBREX) 200 MG capsule Take 200 mg by mouth daily.      cholecalciferol (VITAMIN D3) 25 MCG (1000 UNIT) tablet 1 tablet     diazepam (VALIUM) 10 MG tablet Take 10 mg by mouth at bedtime.      docusate sodium (COLACE) 100 MG capsule Take 200 mg by mouth 2 (two) times daily as needed for mild constipation.      doxycycline (VIBRAMYCIN) 50 MG capsule Take 50 mg by mouth daily.      estradiol (ESTRACE) 0.1 MG/GM vaginal cream Place 0.5 g vaginally 2 (two) times a week. Place 0.5g nightly for two weeks then twice a week after 42 g 11   fluconazole (DIFLUCAN) 150 MG tablet Take 150 mg by mouth once.     furosemide (LASIX) 20 MG tablet Take 20-40 mg by mouth daily as needed.     gabapentin (NEURONTIN) 600 MG tablet Take 600-1,200 mg by mouth 4 (four) times daily - after meals and at bedtime. 1 tablet (600 mg) 3 times a day and 2 tablets(1200mg ) at bedtime     HYDROcodone-acetaminophen (NORCO) 10-325 MG tablet Take 1 tablet by mouth every 6 (six) hours as needed.     levothyroxine (SYNTHROID, LEVOTHROID) 50 MCG tablet Take 50 mcg by mouth daily.     lidocaine-prilocaine (EMLA) cream Apply 1 Application topically as needed. 30 g 0   methocarbamol (ROBAXIN) 500 MG tablet Take 500 mg by mouth 4 (four) times daily.     metoprolol succinate (TOPROL-XL) 25 MG 24 hr tablet Take 1 tablet (25 mg total) by mouth daily. 90 tablet 3   metroNIDAZOLE (METROCREAM) 0.75 % cream Apply topically.     Multiple Vitamin (MULTIVITAMIN WITH MINERALS) TABS Take 1 tablet by mouth daily.     omeprazole (PRILOSEC) 20 MG capsule Take 1 capsule (20 mg total) by mouth 2 (two) times daily before a meal. 60 capsule 3   ondansetron (ZOFRAN) 8 MG tablet Take 8 mg by mouth 2 (two) times daily as needed.     rosuvastatin (CRESTOR) 10 MG tablet Take 10 mg by mouth once a week.     sennosides-docusate sodium (SENOKOT-S) 8.6-50 MG tablet Take 1 tablet by mouth daily.     trastuzumab (HERCEPTIN) 440 MG injection 440 mg by Intravenous (Continuous Infusion) route every 21 ( twenty-one) days. infusion every 3 weeks     No current facility-administered medications for this visit.     ASSESSMENT & PLAN:  Assessment:   1.  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 diagnosed in December 2003.  Subsequent x-rays have shown possible bone metastases as  well.  She was treated for a brief time with hormonal therapy but was unable to tolerate that.  She currently is on trastuzumab every 3 weeks and continues to have her disease under control. Her last scans were done at [facility] in July of 2023, and were clear. She has now asked me to take over her follow up and annual scans  and I have agreed. The current scans from July of 2024 show no evidence of recurrence or metastatic disease.  2.  Osteoporosis, for which she was on yearly Reclast since July 2014.  She has had many fractures of spine, ribs, scapula.  However, she has now completed 10 doses and so I recommended that we stop the Reclast and continue to monitor her bone density.  Her bone density scan in May was stable.   3.  Mild chronic hyponatremia of unknown etiology.  Her sodium is stable was 131 last time but went down to 113 in late May and she was hospitalized.   4. Multiple old and new rib fractures.   Plan: She continues Herceptin without difficulty and will continue this every 3 weeks. Her day 1 cycle 47 of Trastuzumab is scheduled on 03/10/2023. She had a MRI of the brain in June, 2024 that was clear. She is here for yearly restaging, and had a CT scan done on 03/08/2023 that revealed no evidence of recurrent or metastatic disease in the chest, abdomen, or pelvis, improved although persistent heterogeneous and ground glass airspace opacity throughout the dependent lower lobes, consistent with improved infection or aspiration. Her colon is fluid filled to the rectum with diffuse mucosal hyper-enhancement of descending colon, sigmoid, and rectum which are consistent with nonspecific colitis. She had used laxatives the day before and I think this is the explanation for those findings, and she is asymptomatic. I had just seen her last month after she was hospitalized and the nephrologist has told her she has cardiac damage from her "chemotherapy". However, her echocardiogram done in May when she was  hospitalized looked good to me. She did have severe hyponatremia during her hospital stay, down to 113 and had pneumonia. I have contacted Dr. Dulce Sellar and he recommends a repeat echocardiogram at [facility] along with GLS measurements I will get that scheduled. The patient asked me why they are describing cardiomegaly on her CT scan and I am not sure why, as this has not been a known issue. The echocardiogram from May describes normal size of both left and right ventricles. Patient informed me that she has been having issues with urinary retention and has an appointment with a urogynecologist for further evaluation.  I will see her back in 6 months with CBC and CMP, we will most likely repeat an echocardiogram at that time. If all is well, we will wait 1 year to repeat scans again. She knows to call sooner if problems arise regarding her breast cancer or its treatment.  She understands and agrees with this plan of care.    I provided 20 minutes of face-to-face time during this this encounter and > 50% was spent counseling as documented under my assessment and plan.    Dellia Beckwith, MD [facility] [address] [place] [address] Dept: [phone] Dept Fax: [phone]   Rulon Sera Lassiter,acting as a scribe for Dellia Beckwith, MD.,have documented all relevant documentation on the behalf of Dellia Beckwith, MD,as directed by  Dellia Beckwith, MD while in the presence of Dellia Beckwith, MD.

## 2023-03-07 ENCOUNTER — Ambulatory Visit (INDEPENDENT_AMBULATORY_CARE_PROVIDER_SITE_OTHER): Payer: Medicare Other | Admitting: Obstetrics and Gynecology

## 2023-03-07 ENCOUNTER — Encounter: Payer: Self-pay | Admitting: Obstetrics and Gynecology

## 2023-03-07 VITALS — BP 187/81 | HR 78 | Ht 60.2 in | Wt 139.2 lb

## 2023-03-07 DIAGNOSIS — R030 Elevated blood-pressure reading, without diagnosis of hypertension: Secondary | ICD-10-CM

## 2023-03-07 DIAGNOSIS — R350 Frequency of micturition: Secondary | ICD-10-CM

## 2023-03-07 DIAGNOSIS — M6289 Other specified disorders of muscle: Secondary | ICD-10-CM

## 2023-03-07 DIAGNOSIS — N393 Stress incontinence (female) (male): Secondary | ICD-10-CM | POA: Diagnosis not present

## 2023-03-07 DIAGNOSIS — R102 Pelvic and perineal pain: Secondary | ICD-10-CM | POA: Diagnosis not present

## 2023-03-07 DIAGNOSIS — R339 Retention of urine, unspecified: Secondary | ICD-10-CM | POA: Diagnosis not present

## 2023-03-07 LAB — POCT URINALYSIS DIPSTICK
Bilirubin, UA: NEGATIVE
Glucose, UA: NEGATIVE
Ketones, UA: NEGATIVE
Leukocytes, UA: NEGATIVE
Nitrite, UA: NEGATIVE
Protein, UA: NEGATIVE
Spec Grav, UA: 1.01 (ref 1.010–1.025)
Urobilinogen, UA: 0.2 E.U./dL
pH, UA: 6 (ref 5.0–8.0)

## 2023-03-07 MED ORDER — LIDOCAINE-PRILOCAINE 2.5-2.5 % EX CREA: 1.0000 | TOPICAL_CREAM | CUTANEOUS | 0 refills | Status: DC | PRN

## 2023-03-07 MED ORDER — ESTRADIOL 0.1 MG/GM VA CREA: 0.5000 g | TOPICAL_CREAM | VAGINAL | 11 refills | Status: DC

## 2023-03-07 NOTE — Progress Notes (Signed)
[facility] New Patient Evaluation and Consultation  Referring Provider: Philemon Kingdom, MD PCP: Philemon Kingdom, MD Date of Service: 03/07/2023  SUBJECTIVE Chief Complaint: New Patient (Initial Visit) Karen Allison is a 77 y.o. female is here for urinary retention.)  History of Present Illness: Karen Allison is a 77 y.o. White or Caucasian female seen in consultation at the request of Dr. Sudie Bailey for evaluation of problems voiding or swelling on labia.    Review of records significant for: Is currently getting chemo for breast cancer  Urinary Symptoms: Leaks urine with cough/ sneeze and with a full bladder Leaks various time(s) per days.  Pad use: 2 pads per day.   She is bothered by her UI symptoms.  Day time voids 6-8.  Nocturia: infrequent times per night to void. Voiding dysfunction: she empties her bladder well.  does not use a catheter to empty bladder.  When urinating, she feels a weak stream, difficulty starting urine stream, the need to urinate multiple times in a row, and to push on her belly or vagina to empty bladder   UTIs: 0 UTI's in the last year.   Denies history of blood in urine, kidney or bladder stones, pyelonephritis, bladder cancer, and kidney cancer  Pelvic Organ Prolapse Symptoms:                  She Denies a feeling of a bulge the vaginal area.   Bowel Symptom: Bowel movements: 1-3 time(s) per day Stool consistency: loose Straining: no.  Splinting: no.  Incomplete evacuation: no.  She Admits to accidental bowel leakage / fecal incontinence  Occurs: infrequently   Consistency with leakage: loose Bowel regimen: stool softener Last colonoscopy: Date >10 years ago, Results negative  Sexual Function Sexually active: no.  Sexual orientation: Straight Pain with sex: No  Pelvic Pain Denies pelvic pain    Past Medical History:  Past Medical History:  Diagnosis Date   A-fib (HCC)    Arthritis    Blood  dyscrasia    bleeds and bruises easily   Breast cancer (HCC)    left   Gait disorder 07/05/2016   Hepatitis     Hep A years ago   Hypertension    Hypothyroidism    Graves disease, age 64 yo   Nose trouble    right nostril will hemorrhage at times   Pneumonia    Rosacea    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HCC)      Past Surgical History:   Past Surgical History:  Procedure Laterality Date   ABDOMINAL HYSTERECTOMY     Age 65   ANTERIOR CERVICAL DECOMP/DISCECTOMY FUSION N/A 05/06/2014   Procedure: ANTERIOR CERVICAL DISCECTOMY FUSION C3-4 with transgraft, local bone graft, plate and screws;  Surgeon: Kerrin Champagne, MD;  Location: [facility] OR;  Service: Orthopedics;  Laterality: N/A;   APPENDECTOMY     BREAST LUMPECTOMY Left    COLONOSCOPY     EYE SURGERY Bilateral    cataracts   LUMBAR SPINE SURGERY  2011   Removal of hematoma   ORIF HUMERUS FRACTURE Left 2013   THORACIC SPINE SURGERY  2011   Bone cages   THYROIDECTOMY     TONSILLECTOMY       Past OB/GYN History: G1 P1 Vaginal deliveries: 1,  Forceps/ Vacuum deliveries: 0, Cesarean section: 0 Menopausal: Yes, at age 70 Last pap smear was Hysterectomy   Medications: She has a current medication list which includes the following prescription(s): aspirin ec,  b complex vitamins, calcium carbonate-vitamin d, celecoxib, cholecalciferol, diazepam, docusate sodium, doxycycline, fluconazole, furosemide, gabapentin, hydrocodone-acetaminophen, levothyroxine, methocarbamol, metoprolol succinate, metronidazole, multivitamin with minerals, omeprazole, ondansetron, rosuvastatin, sennosides-docusate sodium, trastuzumab, [START ON 03/09/2023] estradiol, and lidocaine-prilocaine.   Allergies: Patient is allergic to cefuroxime and zithromax [azithromycin].   Social History:  Social History   Tobacco Use   Smoking status: Former    Current packs/day: 0.00    Types: Cigarettes    Quit date: 04/23/1962    Years since quitting: 60.9    Smokeless tobacco: Never   Tobacco comments:    as teenager  Vaping Use   Vaping status: Never Used  Substance Use Topics   Alcohol use: Not Currently    Comment: Social   Drug use: No    Relationship status: married She lives with husband.   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 Negative for chills and fever.  Respiratory:  Negative for cough, shortness of breath and wheezing.   Cardiovascular:  Positive for palpitations and leg swelling. Negative for chest pain.  Gastrointestinal:  Negative for abdominal pain, blood in stool and constipation.  Neurological:  Positive for weakness. Negative for dizziness and headaches.  Endo/Heme/Allergies:  Bruises/bleeds easily.  Psychiatric/Behavioral:  Positive for depression. The patient is nervous/anxious.      OBJECTIVE Physical Exam: Vitals:   03/07/23 1415 03/07/23 1508  BP: (!) 175/73 (!) 187/81  Pulse: 79 78  Weight: 139 lb 3.2 oz (63.1 kg)   Height: 5' 0.2" (1.529 m)     Physical Exam   GU / Detailed Urogynecologic Evaluation:  Pelvic Exam: Normal external female genitalia; Bartholin's and Skene's glands normal in appearance; urethral meatus normal in appearance, no urethral masses or discharge.   CST: positive  s/p hysterectomy: Speculum exam reveals normal vaginal mucosa with  atrophy and normal vaginal cuff.  Adnexa normal adnexa.    With apex supported, anterior compartment defect was reduced  Pelvic floor strength II/V  Pelvic floor musculature: Right levator tender, Right obturator tender, Left levator tender, Left obturator tender  POP-Q:   POP-Q  -3                                             Aa   -3                                           Ba  -5                                              C   2  Gh  3.5                                            Pb  5.5                                            tvl   -3                                            Ap  -3                                            Bp                                                 D      Rectal Exam:  Normal external exam.   Post-Void Residual (PVR) by Bladder Scan: In order to evaluate bladder emptying, we discussed obtaining a postvoid residual and she agreed to this procedure.  Procedure: The ultrasound unit was placed on the patient's abdomen in the suprapubic region after the patient had voided. A PVR of 44 ml was obtained by bladder scan.  Laboratory Results: POC Urine: Trace intact blood, negative for all other signs of infection   ASSESSMENT AND PLAN Karen Allison is a 77 y.o. with:  1. Incomplete bladder emptying   2. Pelvic floor dysfunction in female   3. SUI (stress urinary incontinence, female)   4. Vaginal pain   5. Urinary frequency   6. Elevated blood pressure reading    Patient came in for incomplete bladder emptying but PVR via bladder scan showed 44ml leftover in bladder. Patient is on Flomax and plans to continue to take this. We discussed that some of her symptoms may be related to pelvic floor tension and difficulty relaxing to empty the bladder. We discussed starting pelvic floor PT, which she is open to. Referral placed. We also discussed if she feels she is retaining more we can evaluate this further with urodynamics testing.  Patient has significant pelvic floor muscle tension and tenderness on speculum exam. Referral placed for pelvic floor PT.  Patient had a positive CST on exam. We briefly discussed options for treatment including a sling and urethral bulking and she reports she is not interested in  treatment at this time. Would not suggest a pessary at this time due to the shortened vaginal length and vaginal atrophy.  Patient had pain following examination. Prescribed EMLA cream that she can use PRN.  OAB symptoms not as bothersome to patient but this may be improved by PT as well. POC urine negative for signs of infection.  Patient had multiple readings of elevated blood pressure in the office today. She is aware of the readings. She is to follow up with her primary care provider.   Patient to return PRN.     Selmer Dominion, NP

## 2023-03-07 NOTE — Patient Instructions (Addendum)
Suvi, it was such a pleasure meeting you today.   On exam you did not have prolapse or any obvious retention based on the bladder scan after urination.   You do have significant vaginal atrophy. I have sent in the estrace cream. Please use this inside the vagina at least twice a week.   You also have significantly tight pelvic floor muscles that may contribute to your feeling of not completely emptying your bladder. Start pelvic floor PT for this.  EMLA cream was sent in today for pain at the opening of the vagina.   Please call if you have any problems or feel like you need to have bladder testing done. Do not feel this is necessary at this time.

## 2023-03-08 DIAGNOSIS — S2242XA Multiple fractures of ribs, left side, initial encounter for closed fracture: Secondary | ICD-10-CM | POA: Diagnosis not present

## 2023-03-08 DIAGNOSIS — K769 Liver disease, unspecified: Secondary | ICD-10-CM | POA: Diagnosis not present

## 2023-03-08 DIAGNOSIS — C50912 Malignant neoplasm of unspecified site of left female breast: Secondary | ICD-10-CM | POA: Diagnosis not present

## 2023-03-08 DIAGNOSIS — Z170 Estrogen receptor positive status [ER+]: Secondary | ICD-10-CM | POA: Diagnosis not present

## 2023-03-08 DIAGNOSIS — C7981 Secondary malignant neoplasm of breast: Secondary | ICD-10-CM | POA: Diagnosis not present

## 2023-03-08 DIAGNOSIS — R918 Other nonspecific abnormal finding of lung field: Secondary | ICD-10-CM | POA: Diagnosis not present

## 2023-03-08 LAB — COMPREHENSIVE METABOLIC PANEL: eGFR: >60

## 2023-03-08 LAB — BASIC METABOLIC PANEL
BUN: 10 (ref 4–21)
Creatinine: 0.7 (ref 0.5–1.1)

## 2023-03-09 ENCOUNTER — Other Ambulatory Visit: Payer: Self-pay | Admitting: Oncology

## 2023-03-09 ENCOUNTER — Inpatient Hospital Stay: Payer: Medicare Other | Attending: Oncology | Admitting: Oncology

## 2023-03-09 ENCOUNTER — Telehealth: Payer: Self-pay

## 2023-03-09 ENCOUNTER — Telehealth: Payer: Self-pay | Admitting: Oncology

## 2023-03-09 VITALS — BP 128/70 | HR 68 | Temp 97.7°F | Resp 16 | Ht 60.2 in | Wt 133.5 lb

## 2023-03-09 DIAGNOSIS — I2510 Atherosclerotic heart disease of native coronary artery without angina pectoris: Secondary | ICD-10-CM | POA: Insufficient documentation

## 2023-03-09 DIAGNOSIS — Z79899 Other long term (current) drug therapy: Secondary | ICD-10-CM | POA: Insufficient documentation

## 2023-03-09 DIAGNOSIS — C50912 Malignant neoplasm of unspecified site of left female breast: Secondary | ICD-10-CM | POA: Insufficient documentation

## 2023-03-09 DIAGNOSIS — R269 Unspecified abnormalities of gait and mobility: Secondary | ICD-10-CM | POA: Insufficient documentation

## 2023-03-09 DIAGNOSIS — M549 Dorsalgia, unspecified: Secondary | ICD-10-CM | POA: Insufficient documentation

## 2023-03-09 DIAGNOSIS — Z5112 Encounter for antineoplastic immunotherapy: Secondary | ICD-10-CM | POA: Insufficient documentation

## 2023-03-09 DIAGNOSIS — F32A Depression, unspecified: Secondary | ICD-10-CM | POA: Insufficient documentation

## 2023-03-09 DIAGNOSIS — Z170 Estrogen receptor positive status [ER+]: Secondary | ICD-10-CM | POA: Diagnosis not present

## 2023-03-09 DIAGNOSIS — M8000XG Age-related osteoporosis with current pathological fracture, unspecified site, subsequent encounter for fracture with delayed healing: Secondary | ICD-10-CM

## 2023-03-09 DIAGNOSIS — I700 Atherosclerosis of aorta: Secondary | ICD-10-CM | POA: Insufficient documentation

## 2023-03-09 DIAGNOSIS — C787 Secondary malignant neoplasm of liver and intrahepatic bile duct: Secondary | ICD-10-CM | POA: Insufficient documentation

## 2023-03-09 DIAGNOSIS — Z08 Encounter for follow-up examination after completed treatment for malignant neoplasm: Secondary | ICD-10-CM

## 2023-03-09 DIAGNOSIS — M810 Age-related osteoporosis without current pathological fracture: Secondary | ICD-10-CM | POA: Insufficient documentation

## 2023-03-09 DIAGNOSIS — E871 Hypo-osmolality and hyponatremia: Secondary | ICD-10-CM | POA: Insufficient documentation

## 2023-03-09 NOTE — Telephone Encounter (Signed)
03/09/23 Left msg for patient to call about next appts.

## 2023-03-09 NOTE — Telephone Encounter (Signed)
Pt states she had scans yesterday and doesn't see report has been read yet. Please advise.

## 2023-03-10 ENCOUNTER — Inpatient Hospital Stay: Payer: Medicare Other

## 2023-03-10 ENCOUNTER — Ambulatory Visit: Payer: Medicare Other

## 2023-03-10 VITALS — BP 141/65 | HR 70 | Temp 98.0°F | Resp 16 | Ht 60.2 in | Wt 134.0 lb

## 2023-03-10 DIAGNOSIS — M549 Dorsalgia, unspecified: Secondary | ICD-10-CM | POA: Diagnosis not present

## 2023-03-10 DIAGNOSIS — I2510 Atherosclerotic heart disease of native coronary artery without angina pectoris: Secondary | ICD-10-CM | POA: Diagnosis not present

## 2023-03-10 DIAGNOSIS — Z5112 Encounter for antineoplastic immunotherapy: Secondary | ICD-10-CM | POA: Diagnosis not present

## 2023-03-10 DIAGNOSIS — C787 Secondary malignant neoplasm of liver and intrahepatic bile duct: Secondary | ICD-10-CM | POA: Diagnosis present

## 2023-03-10 DIAGNOSIS — E871 Hypo-osmolality and hyponatremia: Secondary | ICD-10-CM | POA: Diagnosis not present

## 2023-03-10 DIAGNOSIS — R269 Unspecified abnormalities of gait and mobility: Secondary | ICD-10-CM | POA: Diagnosis not present

## 2023-03-10 DIAGNOSIS — F32A Depression, unspecified: Secondary | ICD-10-CM | POA: Diagnosis not present

## 2023-03-10 DIAGNOSIS — C50912 Malignant neoplasm of unspecified site of left female breast: Secondary | ICD-10-CM

## 2023-03-10 DIAGNOSIS — Z79899 Other long term (current) drug therapy: Secondary | ICD-10-CM | POA: Diagnosis not present

## 2023-03-10 DIAGNOSIS — M810 Age-related osteoporosis without current pathological fracture: Secondary | ICD-10-CM | POA: Diagnosis not present

## 2023-03-10 DIAGNOSIS — I700 Atherosclerosis of aorta: Secondary | ICD-10-CM | POA: Diagnosis not present

## 2023-03-10 MED ORDER — TRASTUZUMAB-ANNS CHEMO 420 MG IV SOLR
6.0000 mg/kg | Freq: Once | INTRAVENOUS | Status: AC
  Administered 2023-03-10: 378 mg via INTRAVENOUS
  Filled 2023-03-10: qty 18

## 2023-03-10 MED ORDER — SODIUM CHLORIDE 0.9 % IV SOLN: Freq: Once | INTRAVENOUS | Status: AC

## 2023-03-10 NOTE — Patient Instructions (Signed)
Trastuzumab Injection What is this medication? TRASTUZUMAB (tras TOO zoo mab) treats breast cancer and stomach cancer. It works by blocking a protein that causes cancer cells to grow and multiply. This helps to slow or stop the spread of cancer cells. This medicine may be used for other purposes; ask your health care provider or pharmacist if you have questions. COMMON BRAND NAME(S): Herceptin, Herzuma, KANJINTI, Ogivri, Ontruzant, Trazimera What should I tell my care team before I take this medication? They need to know if you have any of these conditions: Heart failure Lung disease An unusual or allergic reaction to trastuzumab, other medications, foods, dyes, or preservatives Pregnant or trying to get pregnant Breast-feeding How should I use this medication? This medication is injected into a vein. It is given by your care team in a hospital or clinic setting. Talk to your care team about the use of this medication in children. It is not approved for use in children. Overdosage: If you think you have taken too much of this medicine contact a poison control center or emergency room at once. NOTE: This medicine is only for you. Do not share this medicine with others. What if I miss a dose? Keep appointments for follow-up doses. It is important not to miss your dose. Call your care team if you are unable to keep an appointment. What may interact with this medication? Certain types of chemotherapy, such as daunorubicin, doxorubicin, epirubicin, idarubicin This list may not describe all possible interactions. Give your health care provider a list of all the medicines, herbs, non-prescription drugs, or dietary supplements you use. Also tell them if you smoke, drink alcohol, or use illegal drugs. Some items may interact with your medicine. What should I watch for while using this medication? Your condition will be monitored carefully while you are receiving this medication. This medication may  make you feel generally unwell. This is not uncommon, as chemotherapy affects healthy cells as well as cancer cells. Report any side effects. Continue your course of treatment even though you feel ill unless your care team tells you to stop. This medication may increase your risk of getting an infection. Call your care team for advice if you get a fever, chills, sore throat, or other symptoms of a cold or flu. Do not treat yourself. Try to avoid being around people who are sick. Avoid taking medications that contain aspirin, acetaminophen, ibuprofen, naproxen, or ketoprofen unless instructed by your care team. These medications can hide a fever. Talk to your care team if you may be pregnant. Serious birth defects can occur if you take this medication during pregnancy and for 7 months after the last dose. You will need a negative pregnancy test before starting this medication. Contraception is recommended while taking this medication and for 7 months after the last dose. Your care team can help you find the option that works for you. Do not breastfeed while taking this medication and for 7 months after stopping treatment. What side effects may I notice from receiving this medication? Side effects that you should report to your care team as soon as possible: Allergic reactions or angioedema--skin rash, itching or hives, swelling of the face, eyes, lips, tongue, arms, or legs, trouble swallowing or breathing Dry cough, shortness of breath or trouble breathing Heart failure--shortness of breath, swelling of the ankles, feet, or hands, sudden weight gain, unusual weakness or fatigue Infection--fever, chills, cough, or sore throat Infusion reactions--chest pain, shortness of breath or trouble breathing, feeling faint or   lightheaded Side effects that usually do not require medical attention (report to your care team if they continue or are bothersome): Diarrhea Dizziness Headache Nausea Trouble  sleeping Vomiting This list may not describe all possible side effects. Call your doctor for medical advice about side effects. You may report side effects to FDA at 1-800-FDA-1088. Where should I keep my medication? This medication is given in a hospital or clinic. It will not be stored at home. NOTE: This sheet is a summary. It may not cover all possible information. If you have questions about this medicine, talk to your doctor, pharmacist, or health care provider.  2024 Elsevier/Gold Standard (2021-12-21 00:00:00)  

## 2023-03-13 ENCOUNTER — Other Ambulatory Visit: Payer: Self-pay

## 2023-03-17 ENCOUNTER — Ambulatory Visit: Payer: Medicare Other

## 2023-03-20 DIAGNOSIS — R5383 Other fatigue: Secondary | ICD-10-CM | POA: Diagnosis not present

## 2023-03-20 DIAGNOSIS — E039 Hypothyroidism, unspecified: Secondary | ICD-10-CM | POA: Diagnosis not present

## 2023-03-20 DIAGNOSIS — E871 Hypo-osmolality and hyponatremia: Secondary | ICD-10-CM | POA: Diagnosis not present

## 2023-03-20 DIAGNOSIS — I10 Essential (primary) hypertension: Secondary | ICD-10-CM | POA: Diagnosis not present

## 2023-03-21 ENCOUNTER — Encounter: Payer: Self-pay | Admitting: Oncology

## 2023-03-23 ENCOUNTER — Encounter: Payer: Self-pay | Admitting: Oncology

## 2023-03-30 MED FILL — Trastuzumab-anns For IV Soln 420 MG: INTRAVENOUS | Qty: 18 | Status: AC

## 2023-03-31 ENCOUNTER — Inpatient Hospital Stay: Payer: Medicare Other | Attending: Oncology

## 2023-03-31 ENCOUNTER — Ambulatory Visit: Payer: Medicare Other

## 2023-03-31 VITALS — BP 162/68 | HR 65 | Temp 98.1°F | Resp 18 | Ht 60.02 in | Wt 135.2 lb

## 2023-03-31 DIAGNOSIS — Z79899 Other long term (current) drug therapy: Secondary | ICD-10-CM | POA: Diagnosis not present

## 2023-03-31 DIAGNOSIS — Z170 Estrogen receptor positive status [ER+]: Secondary | ICD-10-CM | POA: Insufficient documentation

## 2023-03-31 DIAGNOSIS — C50912 Malignant neoplasm of unspecified site of left female breast: Secondary | ICD-10-CM | POA: Diagnosis not present

## 2023-03-31 DIAGNOSIS — Z79620 Long term (current) use of immunosuppressive biologic: Secondary | ICD-10-CM | POA: Insufficient documentation

## 2023-03-31 DIAGNOSIS — Z5112 Encounter for antineoplastic immunotherapy: Secondary | ICD-10-CM | POA: Diagnosis not present

## 2023-03-31 DIAGNOSIS — C787 Secondary malignant neoplasm of liver and intrahepatic bile duct: Secondary | ICD-10-CM | POA: Diagnosis not present

## 2023-03-31 MED ORDER — TRASTUZUMAB-ANNS CHEMO 420 MG IV SOLR
6.0000 mg/kg | Freq: Once | INTRAVENOUS | Status: AC
  Administered 2023-03-31: 378 mg via INTRAVENOUS
  Filled 2023-03-31: qty 18

## 2023-03-31 MED ORDER — SODIUM CHLORIDE 0.9 % IV SOLN: Freq: Once | INTRAVENOUS | Status: AC

## 2023-03-31 NOTE — Patient Instructions (Signed)
[facility]  Discharge Instructions: Thank you for choosing [facility] to provide your oncology and hematology care.  If you have a lab appointment with the [facility], please go directly to the [facility] and check in at the registration area.   Wear comfortable clothing and clothing appropriate for easy access to any Portacath or PICC line.   We strive to give you quality time with your provider. You may need to reschedule your appointment if you arrive late (15 or more minutes).  Arriving late affects you and other patients whose appointments are after yours.  Also, if you miss three or more appointments without notifying the office, you may be dismissed from the clinic at the provider's discretion.      For prescription refill requests, have your pharmacy contact our office and allow 72 hours for refills to be completed.    Today you received the following chemotherapy and/or immunotherapy agents kanjinti      To help prevent nausea and vomiting after your treatment, we encourage you to take your nausea medication as directed.  BELOW ARE SYMPTOMS THAT SHOULD BE REPORTED IMMEDIATELY: *FEVER GREATER THAN 100.4 F (38 C) OR HIGHER *CHILLS OR SWEATING *NAUSEA AND VOMITING THAT IS NOT CONTROLLED WITH YOUR NAUSEA MEDICATION *UNUSUAL SHORTNESS OF BREATH *UNUSUAL BRUISING OR BLEEDING *URINARY PROBLEMS (pain or burning when urinating, or frequent urination) *BOWEL PROBLEMS (unusual diarrhea, constipation, pain near the anus) TENDERNESS IN MOUTH AND THROAT WITH OR WITHOUT PRESENCE OF ULCERS (sore throat, sores in mouth, or a toothache) UNUSUAL RASH, SWELLING OR PAIN  UNUSUAL VAGINAL DISCHARGE OR ITCHING   Items with * indicate a potential emergency and should be followed up as soon as possible or go to the Emergency Department if any problems should occur.  Please show the CHEMOTHERAPY ALERT CARD or IMMUNOTHERAPY ALERT CARD at check-in to the  Emergency Department and triage nurse.  Should you have questions after your visit or need to cancel or reschedule your appointment, please contact [facility]  Dept: [phone]  and follow the prompts.  Office hours are 8:00 a.m. to 4:30 p.m. Monday - Friday. Please note that voicemails left after 4:00 p.m. may not be returned until the following business day.  We are closed weekends and major holidays. You have access to a nurse at all times for urgent questions. Please call the main number to the clinic Dept: [phone] and follow the prompts.  For any non-urgent questions, you may also contact your provider using MyChart. We now offer e-Visits for anyone 32 and older to request care online for non-urgent symptoms. For details visit [email or web address].   Also download the MyChart app! Go to the app store, search "MyChart", open the app, select [facility], and log in with your MyChart username and password.

## 2023-04-03 ENCOUNTER — Encounter: Payer: Self-pay | Admitting: Oncology

## 2023-04-05 ENCOUNTER — Ambulatory Visit: Payer: Medicare Other | Attending: Oncology

## 2023-04-05 DIAGNOSIS — Z0189 Encounter for other specified special examinations: Secondary | ICD-10-CM | POA: Diagnosis not present

## 2023-04-05 DIAGNOSIS — C50912 Malignant neoplasm of unspecified site of left female breast: Secondary | ICD-10-CM | POA: Diagnosis not present

## 2023-04-05 DIAGNOSIS — Z08 Encounter for follow-up examination after completed treatment for malignant neoplasm: Secondary | ICD-10-CM | POA: Diagnosis not present

## 2023-04-05 DIAGNOSIS — Z170 Estrogen receptor positive status [ER+]: Secondary | ICD-10-CM | POA: Insufficient documentation

## 2023-04-05 LAB — ECHOCARDIOGRAM COMPLETE: S' Lateral: 3.2 cm

## 2023-04-10 ENCOUNTER — Other Ambulatory Visit: Payer: Self-pay

## 2023-04-13 DIAGNOSIS — H26491 Other secondary cataract, right eye: Secondary | ICD-10-CM | POA: Diagnosis not present

## 2023-04-13 DIAGNOSIS — D3131 Benign neoplasm of right choroid: Secondary | ICD-10-CM | POA: Diagnosis not present

## 2023-04-13 DIAGNOSIS — H43812 Vitreous degeneration, left eye: Secondary | ICD-10-CM | POA: Diagnosis not present

## 2023-04-13 DIAGNOSIS — Z961 Presence of intraocular lens: Secondary | ICD-10-CM | POA: Diagnosis not present

## 2023-04-13 DIAGNOSIS — H10823 Rosacea conjunctivitis, bilateral: Secondary | ICD-10-CM | POA: Diagnosis not present

## 2023-04-21 ENCOUNTER — Inpatient Hospital Stay: Payer: Medicare Other

## 2023-04-21 VITALS — BP 140/78 | HR 69 | Temp 98.4°F | Resp 18 | Ht 60.02 in | Wt 135.1 lb

## 2023-04-21 DIAGNOSIS — Z5112 Encounter for antineoplastic immunotherapy: Secondary | ICD-10-CM | POA: Diagnosis not present

## 2023-04-21 DIAGNOSIS — Z170 Estrogen receptor positive status [ER+]: Secondary | ICD-10-CM | POA: Diagnosis not present

## 2023-04-21 DIAGNOSIS — C50912 Malignant neoplasm of unspecified site of left female breast: Secondary | ICD-10-CM

## 2023-04-21 DIAGNOSIS — Z79899 Other long term (current) drug therapy: Secondary | ICD-10-CM | POA: Diagnosis not present

## 2023-04-21 DIAGNOSIS — Z79620 Long term (current) use of immunosuppressive biologic: Secondary | ICD-10-CM | POA: Diagnosis not present

## 2023-04-21 DIAGNOSIS — C787 Secondary malignant neoplasm of liver and intrahepatic bile duct: Secondary | ICD-10-CM | POA: Diagnosis not present

## 2023-04-21 MED ORDER — TRASTUZUMAB-ANNS CHEMO 420 MG IV SOLR
6.0000 mg/kg | Freq: Once | INTRAVENOUS | Status: AC
  Administered 2023-04-21: 378 mg via INTRAVENOUS
  Filled 2023-04-21: qty 18

## 2023-04-21 MED ORDER — SODIUM CHLORIDE 0.9 % IV SOLN: Freq: Once | INTRAVENOUS | Status: AC

## 2023-04-21 NOTE — Patient Instructions (Signed)
Trastuzumab Injection What is this medication? TRASTUZUMAB (tras TOO zoo mab) treats breast cancer and stomach cancer. It works by blocking a protein that causes cancer cells to grow and multiply. This helps to slow or stop the spread of cancer cells. This medicine may be used for other purposes; ask your health care provider or pharmacist if you have questions. COMMON BRAND NAME(S): Herceptin, Herzuma, KANJINTI, Ogivri, Ontruzant, Trazimera What should I tell my care team before I take this medication? They need to know if you have any of these conditions: Heart failure Lung disease An unusual or allergic reaction to trastuzumab, other medications, foods, dyes, or preservatives Pregnant or trying to get pregnant Breast-feeding How should I use this medication? This medication is injected into a vein. It is given by your care team in a hospital or clinic setting. Talk to your care team about the use of this medication in children. It is not approved for use in children. Overdosage: If you think you have taken too much of this medicine contact a poison control center or emergency room at once. NOTE: This medicine is only for you. Do not share this medicine with others. What if I miss a dose? Keep appointments for follow-up doses. It is important not to miss your dose. Call your care team if you are unable to keep an appointment. What may interact with this medication? Certain types of chemotherapy, such as daunorubicin, doxorubicin, epirubicin, idarubicin This list may not describe all possible interactions. Give your health care provider a list of all the medicines, herbs, non-prescription drugs, or dietary supplements you use. Also tell them if you smoke, drink alcohol, or use illegal drugs. Some items may interact with your medicine. What should I watch for while using this medication? Your condition will be monitored carefully while you are receiving this medication. This medication may  make you feel generally unwell. This is not uncommon, as chemotherapy affects healthy cells as well as cancer cells. Report any side effects. Continue your course of treatment even though you feel ill unless your care team tells you to stop. This medication may increase your risk of getting an infection. Call your care team for advice if you get a fever, chills, sore throat, or other symptoms of a cold or flu. Do not treat yourself. Try to avoid being around people who are sick. Avoid taking medications that contain aspirin, acetaminophen, ibuprofen, naproxen, or ketoprofen unless instructed by your care team. These medications can hide a fever. Talk to your care team if you may be pregnant. Serious birth defects can occur if you take this medication during pregnancy and for 7 months after the last dose. You will need a negative pregnancy test before starting this medication. Contraception is recommended while taking this medication and for 7 months after the last dose. Your care team can help you find the option that works for you. Do not breastfeed while taking this medication and for 7 months after stopping treatment. What side effects may I notice from receiving this medication? Side effects that you should report to your care team as soon as possible: Allergic reactions or angioedema--skin rash, itching or hives, swelling of the face, eyes, lips, tongue, arms, or legs, trouble swallowing or breathing Dry cough, shortness of breath or trouble breathing Heart failure--shortness of breath, swelling of the ankles, feet, or hands, sudden weight gain, unusual weakness or fatigue Infection--fever, chills, cough, or sore throat Infusion reactions--chest pain, shortness of breath or trouble breathing, feeling faint or   lightheaded Side effects that usually do not require medical attention (report to your care team if they continue or are bothersome): Diarrhea Dizziness Headache Nausea Trouble  sleeping Vomiting This list may not describe all possible side effects. Call your doctor for medical advice about side effects. You may report side effects to FDA at 1-800-FDA-1088. Where should I keep my medication? This medication is given in a hospital or clinic. It will not be stored at home. NOTE: This sheet is a summary. It may not cover all possible information. If you have questions about this medicine, talk to your doctor, pharmacist, or health care provider.  2024 Elsevier/Gold Standard (2021-12-21 00:00:00)  

## 2023-05-03 ENCOUNTER — Encounter: Payer: Self-pay | Admitting: Oncology

## 2023-05-08 NOTE — Therapy (Unsigned)
OUTPATIENT PHYSICAL THERAPY FEMALE PELVIC EVALUATION   Patient Name: Karen Allison MRN: [id number] DOB:1946/08/12, 77 y.o., female Today's Date: 05/09/2023  END OF SESSION:  PT End of Session - 05/09/23 1400     Visit Number 1    Date for PT Re-Evaluation 07/04/23    Authorization Type medicare    Authorization - Visit Number 1    Authorization - Number of Visits 10    PT Start Time 1350    PT Stop Time 1440    PT Time Calculation (min) 50 min    Activity Tolerance Patient tolerated treatment well    Behavior During Therapy WFL for tasks assessed/performed             Past Medical History:  Diagnosis Date   A-fib (HCC)    Arthritis    Blood dyscrasia    bleeds and bruises easily   Breast cancer (HCC)    left   Gait disorder 07/05/2016   Hepatitis     Hep A years ago   Hypertension    Hypothyroidism    Graves disease, age 19 yo   Nose trouble    right nostril will hemorrhage at times   Pneumonia    Rosacea    Secondary malignant neoplasm of liver [facility])    Past Surgical History:  Procedure Laterality Date   ABDOMINAL HYSTERECTOMY     Age 47   ANTERIOR CERVICAL DECOMP/DISCECTOMY FUSION N/A 05/06/2014   Procedure: ANTERIOR CERVICAL DISCECTOMY FUSION C3-4 with transgraft, local bone graft, plate and screws;  Surgeon: Kerrin Champagne, MD;  Location: [facility] OR;  Service: Orthopedics;  Laterality: N/A;   APPENDECTOMY     BREAST LUMPECTOMY Left    COLONOSCOPY     EYE SURGERY Bilateral    cataracts   LUMBAR SPINE SURGERY  2011   Removal of hematoma   ORIF HUMERUS FRACTURE Left 2013   THORACIC SPINE SURGERY  2011   Bone cages   THYROIDECTOMY     TONSILLECTOMY     Patient Active Problem List   Diagnosis Date Noted   History of corticosteroid therapy 02/13/2023   History of endocrine disorder 02/13/2023   Rib pain on left side 09/26/2022   Hyponatremia 12/02/2021    Class: Chronic   Graves' disease 07/16/2021   Osteoporosis 11/04/2020    Class: Chronic    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   Malignant neoplasm of left breast (HCC) 06/05/2020   Other forms of scoliosis, thoracolumbar region 11/03/2016    Class: Chronic   Gait disorder 07/05/2016   HCAP (healthcare-associated pneumonia) 05/09/2014   Acute respiratory failure with hypoxia (HCC) 05/09/2014   Pneumonia 05/09/2014   Hypertension    Hypothyroidism    Cervical stenosis (uterine cervix) 05/07/2014   Spinal stenosis in cervical region 05/06/2014    Class: Chronic   Cervical spondylosis without myelopathy 05/06/2014   HNP (herniated nucleus pulposus), cervical 05/06/2014   Adult hypothyroidism 05/03/2013   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   Fx lower humerus-closed 10/22/1997   Endometrial cancer (HCC) 01/21/1988    PCP: Philemon Kingdom, MD  REFERRING PROVIDER: Selmer Dominion, NP   REFERRING DIAG:  R10.2 (ICD-10-CM) - Vaginal pain  M62.89 (ICD-10-CM) - Pelvic floor dysfunction in female  N39.3 (ICD-10-CM) - SUI (stress urinary incontinence, female)    THERAPY DIAG:  Cramp and spasm  Other lack of coordination  Rationale for Evaluation and Treatment: Rehabilitation  ONSET DATE: 04/2022  SUBJECTIVE:  SUBJECTIVE STATEMENT: Currently ;having chemo, Herceptin every 3 weeks for breast cancer; Patient is having difficulty with emptying her bladder. Hard to tell when bladder. Waist down numb from a back surgery. Patient is using Flomax to assist with emptying her bladder. She is using the estrogen cream.  Fluid intake: Yes: water, coffee    PAIN:  Are you having pain? No  PRECAUTIONS: Other: breast cancer  RED FLAGS: None   WEIGHT BEARING RESTRICTIONS: No  FALLS:  Has patient fallen in last 6 months? Yes. Number of falls fell in May with some  breaks. She has been to physical therapy to work on strength and balance  LIVING ENVIRONMENT: Lives with: lives with their spouse   OCCUPATION: retired  PLOF: Independent  PATIENT GOALS: void urine without straining  PERTINENT HISTORY:  Left breast cancer; Hypothyroidism;Graves disease; Abdominal Hysterectomy; Anterior cervical discectomy fusion;; Lumbar spine surgery; Endometrial cancer with radical hysterectomy.   BOWEL MOVEMENT: managing bowel movements with fiber   URINATION: Pain with urination: No Fully empty bladder: Yes: At night need to urinate multiple times in a row, and to push on her belly or vagina to empty bladder  Stream: Weak Urgency: No Frequency: Day time voids 6-8.  Nocturia: infrequent times per night to void.  Leakage: Coughing and Sneezing Pads: Yes: 2 pads per day  INTERCOURSE:Not active  PREGNANCY: Vaginal deliveries 1  PROLAPSE: None   OBJECTIVE:   DIAGNOSTIC FINDINGS:  Pelvic floor strength II/V  Pelvic floor musculature: Right levator tender, Right obturator tender, Left levator tender, Left obturator tender  PVR of 44 ml was obtained by bladder scan.  Positive CST on exam    COGNITION: Overall cognitive status: Within functional limits for tasks assessed     SENSATION: Light touch: Appears intact Proprioception: Appears intact   GAIT: Assistive device utilized:  3 wheeled walker Level of assistance: Complete Independence   POSTURE: rounded shoulders, forward head, decreased lumbar lordosis, and increased thoracic kyphosis  PELVIC ALIGNMENT:   LOWER EXTREMITY ROM: full bilateral hip ROM   LOWER EXTREMITY MMT: 4/5 through out extremities   PALPATION:   General  Bulge lower abdomen when contract the abdominals                External Perineal Exam intact, slight redness around the introitus                             Internal Pelvic Floor tightness throughout the pelvic floor, decreased tissue mobility posterior  vaginal canal, therapist only able to place her index finger to the PIP due to shorten vaginal canal.   Patient confirms identification and approves PT to assess internal pelvic floor and treatment Yes  PELVIC MMT:   MMT eval  Vaginal 2/5 with hug of therapist finger but not able to bulge  (Blank rows = not tested)        TONE: increased    TODAY'S TREATMENT:  DATE: 05/09/23  EVAL See below   PATIENT EDUCATION:  05/09/23 Education details: Access Code: [email or web address] Person educated: Patient Education method: Explanation, Demonstration, Tactile cues, Verbal cues, and Handouts Education comprehension: verbalized understanding, returned demonstration, verbal cues required, tactile cues required, and needs further education  HOME EXERCISE PROGRAM: 05/09/23 Access Code: [email or web address] URL: [email or web address] Date: 05/09/2023 Prepared by: Eulis Foster  Exercises - Supine Diaphragmatic Breathing  - 2 x daily - 7 x weekly - 1 sets - 10 reps - Seated Diaphragmatic Breathing  - 1 x daily - 7 x weekly - 1 sets - 10 reps  ASSESSMENT:  CLINICAL IMPRESSION: Patient is a 77 y.o. female who was seen today for physical therapy evaluation and treatment for stress incontinence, vaginal pain and pelvic floor dysfunction. Patient is presently having chemotherapy for breast cancer. She has a history of endometrial cancer with radical hysterectomy. Patient had lumbar spine surgery in the past and now has some numbness in the perineal area and bilateral lower extremity. She reports she is having difficulty with pushing her urine out and not knowing when she has to urinate. She will have to urinate multiple times in a row before going to bed. She is presently using Estrace cream and reports it has helped with vaginal discomfort. She will leak urine with coughing and sneezing.  Pelvic floor strength is 2/5 with hug of therapist finger. She will bulge her lower abdomen when contracting the abdominals. Patient will benefit from skilled therapy to improve pelvic floor elongation and coordination.      OBJECTIVE IMPAIRMENTS: decreased activity tolerance, decreased coordination, decreased endurance, decreased strength, increased fascial restrictions, and increased muscle spasms.   ACTIVITY LIMITATIONS: continence and toileting  PARTICIPATION LIMITATIONS: community activity  PERSONAL FACTORS: Age, Fitness, and 3+ comorbidities: Left breast cancer; Hypothyroidism;Graves disease; Abdominal Hysterectomy; Anterior cervical discectomy fusion;; Lumbar spine surgery; Endometrial cancer with radical hysterectomy.  are also affecting patient's functional outcome.   REHAB POTENTIAL: Good  CLINICAL DECISION MAKING: Evolving/moderate complexity  EVALUATION COMPLEXITY: Moderate   GOALS: Goals reviewed with patient? Yes  SHORT TERM GOALS: Target date: 10/14/2  Patient educated on pelvic and hip stretches to elongate the pelvic floor.  Baseline: Goal status: INITIAL  2.  Patient is able to perform diaphragmatic breathing to bulge the pelvic floor.  Baseline:  Goal status: INITIAL  3.  Patient educated in ways to relax the pelvic floor when urinating.  Baseline:  Goal status: INITIAL   LONG TERM GOALS: Target date: 07/04/23  Patient educated in HEP Baseline:  Goal status: INITIAL  2.  Patient educated in vaginal trainers to elongate the vaginal canal and reduce vaginal tightness.  Baseline:  Goal status: INITIAL  3.  Patient pelvic floor strength is >/= 3/5 with full contraction and full relaxation.  Baseline:  Goal status: INITIAL  4.  Patient is able to fully empty her bladder when urinating prior to going to bed due to the relaxation of the pelvic floor.  Baseline:  Goal status: INITIAL   PLAN:  PT FREQUENCY: 1x/week  PT DURATION: 8 weeks  PLANNED  INTERVENTIONS: Therapeutic exercises, Therapeutic activity, Neuromuscular re-education, Patient/Family education, Dry Needling, Electrical stimulation, Cryotherapy, Moist heat, Biofeedback, and Manual therapy  PLAN FOR NEXT SESSION: manual work to the pelvic floor first externally then internally, hip and pelvic floor stretches, diaphragmatic breathing   Eulis Foster, PT 05/09/23 4:53 PM

## 2023-05-09 ENCOUNTER — Encounter: Payer: Medicare Other | Attending: Obstetrics and Gynecology | Admitting: Physical Therapy

## 2023-05-09 ENCOUNTER — Other Ambulatory Visit: Payer: Self-pay

## 2023-05-09 ENCOUNTER — Encounter: Payer: Self-pay | Admitting: Physical Therapy

## 2023-05-09 DIAGNOSIS — R252 Cramp and spasm: Secondary | ICD-10-CM | POA: Insufficient documentation

## 2023-05-09 DIAGNOSIS — R278 Other lack of coordination: Secondary | ICD-10-CM | POA: Diagnosis not present

## 2023-05-12 ENCOUNTER — Inpatient Hospital Stay: Payer: Medicare Other | Attending: Oncology

## 2023-05-12 VITALS — BP 151/61 | HR 63 | Temp 97.7°F | Resp 18 | Wt 135.1 lb

## 2023-05-12 DIAGNOSIS — C787 Secondary malignant neoplasm of liver and intrahepatic bile duct: Secondary | ICD-10-CM | POA: Diagnosis not present

## 2023-05-12 DIAGNOSIS — Z79899 Other long term (current) drug therapy: Secondary | ICD-10-CM | POA: Diagnosis not present

## 2023-05-12 DIAGNOSIS — M810 Age-related osteoporosis without current pathological fracture: Secondary | ICD-10-CM | POA: Diagnosis not present

## 2023-05-12 DIAGNOSIS — C50912 Malignant neoplasm of unspecified site of left female breast: Secondary | ICD-10-CM | POA: Diagnosis not present

## 2023-05-12 DIAGNOSIS — E871 Hypo-osmolality and hyponatremia: Secondary | ICD-10-CM | POA: Insufficient documentation

## 2023-05-12 DIAGNOSIS — Z79620 Long term (current) use of immunosuppressive biologic: Secondary | ICD-10-CM | POA: Diagnosis not present

## 2023-05-12 DIAGNOSIS — Z5112 Encounter for antineoplastic immunotherapy: Secondary | ICD-10-CM | POA: Diagnosis not present

## 2023-05-12 MED ORDER — TRASTUZUMAB-ANNS CHEMO 150 MG IV SOLR
6.0000 mg/kg | Freq: Once | INTRAVENOUS | Status: AC
  Administered 2023-05-12: 378 mg via INTRAVENOUS
  Filled 2023-05-12: qty 18

## 2023-05-12 MED ORDER — SODIUM CHLORIDE 0.9% FLUSH: 10.0000 mL | INTRAVENOUS | Status: DC | PRN

## 2023-05-12 MED ORDER — SODIUM CHLORIDE 0.9 % IV SOLN: Freq: Once | INTRAVENOUS | Status: AC

## 2023-05-12 MED ORDER — HEPARIN SOD (PORK) LOCK FLUSH 100 UNIT/ML IV SOLN: 500.0000 [IU] | Freq: Once | INTRAVENOUS | Status: DC | PRN

## 2023-05-12 NOTE — Patient Instructions (Signed)
Trastuzumab Injection What is this medication? TRASTUZUMAB (tras TOO zoo mab) treats breast cancer and stomach cancer. It works by blocking a protein that causes cancer cells to grow and multiply. This helps to slow or stop the spread of cancer cells. This medicine may be used for other purposes; ask your health care provider or pharmacist if you have questions. COMMON BRAND NAME(S): Herceptin, Marlowe Alt, Ontruzant, Trazimera What should I tell my care team before I take this medication? They need to know if you have any of these conditions: Heart failure Lung disease An unusual or allergic reaction to trastuzumab, other medications, foods, dyes, or preservatives Pregnant or trying to get pregnant Breast-feeding How should I use this medication? This medication is injected into a vein. It is given by your care team in a hospital or clinic setting. Talk to your care team about the use of this medication in children. It is not approved for use in children. Overdosage: If you think you have taken too much of this medicine contact a poison control center or emergency room at once. NOTE: This medicine is only for you. Do not share this medicine with others. What if I miss a dose? Keep appointments for follow-up doses. It is important not to miss your dose. Call your care team if you are unable to keep an appointment. What may interact with this medication? Certain types of chemotherapy, such as daunorubicin, doxorubicin, epirubicin, idarubicin This list may not describe all possible interactions. Give your health care provider a list of all the medicines, herbs, non-prescription drugs, or dietary supplements you use. Also tell them if you smoke, drink alcohol, or use illegal drugs. Some items may interact with your medicine. What should I watch for while using this medication? Your condition will be monitored carefully while you are receiving this medication. This medication may  make you feel generally unwell. This is not uncommon, as chemotherapy affects healthy cells as well as cancer cells. Report any side effects. Continue your course of treatment even though you feel ill unless your care team tells you to stop. This medication may increase your risk of getting an infection. Call your care team for advice if you get a fever, chills, sore throat, or other symptoms of a cold or flu. Do not treat yourself. Try to avoid being around people who are sick. Avoid taking medications that contain aspirin, acetaminophen, ibuprofen, naproxen, or ketoprofen unless instructed by your care team. These medications can hide a fever. Talk to your care team if you may be pregnant. Serious birth defects can occur if you take this medication during pregnancy and for 7 months after the last dose. You will need a negative pregnancy test before starting this medication. Contraception is recommended while taking this medication and for 7 months after the last dose. Your care team can help you find the option that works for you. Do not breastfeed while taking this medication and for 7 months after stopping treatment. What side effects may I notice from receiving this medication? Side effects that you should report to your care team as soon as possible: Allergic reactions or angioedema--skin rash, itching or hives, swelling of the face, eyes, lips, tongue, arms, or legs, trouble swallowing or breathing Dry cough, shortness of breath or trouble breathing Heart failure--shortness of breath, swelling of the ankles, feet, or hands, sudden weight gain, unusual weakness or fatigue Infection--fever, chills, cough, or sore throat Infusion reactions--chest pain, shortness of breath or trouble breathing, feeling faint or  lightheaded Side effects that usually do not require medical attention (report to your care team if they continue or are bothersome): Diarrhea Dizziness Headache Nausea Trouble  sleeping Vomiting This list may not describe all possible side effects. Call your doctor for medical advice about side effects. You may report side effects to FDA at 1-800-FDA-1088. Where should I keep my medication? This medication is given in a hospital or clinic. It will not be stored at home. NOTE: This sheet is a summary. It may not cover all possible information. If you have questions about this medicine, talk to your doctor, pharmacist, or health care provider.  2024 Elsevier/Gold Standard (2021-12-21 00:00:00)

## 2023-05-16 ENCOUNTER — Encounter: Payer: Self-pay | Admitting: Physical Therapy

## 2023-05-16 ENCOUNTER — Encounter: Payer: Medicare Other | Admitting: Physical Therapy

## 2023-05-16 DIAGNOSIS — R252 Cramp and spasm: Secondary | ICD-10-CM

## 2023-05-16 DIAGNOSIS — R278 Other lack of coordination: Secondary | ICD-10-CM

## 2023-05-16 NOTE — Patient Instructions (Addendum)
  Moisturizers They are used in the vagina to hydrate the mucous membrane that make up the vaginal canal. Designed to keep a more normal acid balance (ph) Once placed in the vagina, it will last between two to three days.  Use 2-3 times per week at bedtime  Ingredients to avoid is glycerin and fragrance, can increase chance of infection Should not be used just before sex due to causing irritation Most are gels administered either in a tampon-shaped applicator or as a vaginal suppository. They are non-hormonal.     Creams to use externally on the Vulva area Yes Vulva Balm/ V-magic cream by medicine mama- amazon Julva-amazon Vital "V Wild Yam salve ( help moisturize and help with thinning vulvar area, does have Beeswax MoodMaid Botanical Pro-Meno Wild Yam Cream- Amazon Desert Harvest Gele Cleo by Zane Herald labial moisturizer (Amazon),  Coconut or olive oil aloe Good Clean Love Enchanted Rose by intimate rose  Things to avoid in the vaginal area Do not use things to irritate the vulvar area No lotions just specialized creams for the vulva area- Neogyn, V-magic,  No soaps; can use Aveeno or Calendula cleanser, unscented Dove if needed. Must be gentle No deodorants No douches Good to sleep without underwear to let the vaginal area to air out No scrubbing: spread the lips to let warm water rinse over labias and pat dry   Eulis Foster, PT [facility] [address], [address] [place], [place] [address] W: [phone] [email or web address]

## 2023-05-16 NOTE — Therapy (Signed)
OUTPATIENT PHYSICAL THERAPY FEMALE PELVIC TREATMENT   Patient Name: Karen Allison MRN: [id number] DOB:1946/07/10, 77 y.o., female Today's Date: 05/16/2023  END OF SESSION:  PT End of Session - 05/16/23 1406     Visit Number 2    Date for PT Re-Evaluation 07/04/23    Authorization Type medicare    Authorization - Visit Number 2    Authorization - Number of Visits 10    PT Start Time 1400    PT Stop Time 1445    PT Time Calculation (min) 45 min    Activity Tolerance Patient tolerated treatment well    Behavior During Therapy WFL for tasks assessed/performed             Past Medical History:  Diagnosis Date   A-fib (HCC)    Arthritis    Blood dyscrasia    bleeds and bruises easily   Breast cancer (HCC)    left   Gait disorder 07/05/2016   Hepatitis     Hep A years ago   Hypertension    Hypothyroidism    Graves disease, age 80 yo   Nose trouble    right nostril will hemorrhage at times   Pneumonia    Rosacea    Secondary malignant neoplasm of liver [facility])    Past Surgical History:  Procedure Laterality Date   ABDOMINAL HYSTERECTOMY     Age 18   ANTERIOR CERVICAL DECOMP/DISCECTOMY FUSION N/A 05/06/2014   Procedure: ANTERIOR CERVICAL DISCECTOMY FUSION C3-4 with transgraft, local bone graft, plate and screws;  Surgeon: Kerrin Champagne, MD;  Location: [facility] OR;  Service: Orthopedics;  Laterality: N/A;   APPENDECTOMY     BREAST LUMPECTOMY Left    COLONOSCOPY     EYE SURGERY Bilateral    cataracts   LUMBAR SPINE SURGERY  2011   Removal of hematoma   ORIF HUMERUS FRACTURE Left 2013   THORACIC SPINE SURGERY  2011   Bone cages   THYROIDECTOMY     TONSILLECTOMY     Patient Active Problem List   Diagnosis Date Noted   History of corticosteroid therapy 02/13/2023   History of endocrine disorder 02/13/2023   Rib pain on left side 09/26/2022   Hyponatremia 12/02/2021    Class: Chronic   Graves' disease 07/16/2021   Osteoporosis 11/04/2020    Class: Chronic    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   Malignant neoplasm of left breast (HCC) 06/05/2020   Other forms of scoliosis, thoracolumbar region 11/03/2016    Class: Chronic   Gait disorder 07/05/2016   HCAP (healthcare-associated pneumonia) 05/09/2014   Acute respiratory failure with hypoxia (HCC) 05/09/2014   Pneumonia 05/09/2014   Hypertension    Hypothyroidism    Cervical stenosis (uterine cervix) 05/07/2014   Spinal stenosis in cervical region 05/06/2014    Class: Chronic   Cervical spondylosis without myelopathy 05/06/2014   HNP (herniated nucleus pulposus), cervical 05/06/2014   Adult hypothyroidism 05/03/2013   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  Fluid intake: Yes: water, coffee    PAIN:  Are you having pain? No  PRECAUTIONS: Other: breast cancer  RED FLAGS: None   WEIGHT BEARING RESTRICTIONS: No  FALLS:  Has patient fallen in last 6 months? Yes. Number of falls fell in May with some breaks. She has been to physical therapy to work on strength and balance  LIVING ENVIRONMENT: Lives with: lives with their spouse   OCCUPATION: retired  PLOF: Independent  PATIENT GOALS: void urine without  straining  PERTINENT HISTORY:  Left breast cancer; Hypothyroidism;Graves disease; Abdominal Hysterectomy; Anterior cervical discectomy fusion;; Lumbar spine surgery; Endometrial cancer with radical hysterectomy.   BOWEL MOVEMENT: managing bowel movements with fiber   URINATION: Pain with urination: No Fully empty bladder: Yes: At night need to urinate multiple times in a row, and to push on her belly or vagina to empty bladder  Stream: Weak Urgency: No Frequency: Day time voids 6-8.  Nocturia: infrequent times per night to void.  Leakage: Coughing and Sneezing Pads: Yes: 2 pads per day  INTERCOURSE:Not active  PREGNANCY: Vaginal deliveries 1  PROLAPSE: None   OBJECTIVE:   DIAGNOSTIC FINDINGS:  Pelvic floor strength II/V  Pelvic floor musculature: Right levator tender, Right obturator tender, Left levator tender, Left obturator tender  PVR of 44 ml was obtained by bladder scan.  Positive CST on exam    COGNITION: Overall cognitive status: Within functional limits for tasks assessed     SENSATION: Light touch: Appears intact Proprioception: Appears intact   GAIT: Assistive device utilized:  3 wheeled walker Level of assistance: Complete Independence   POSTURE: rounded shoulders, forward head, decreased lumbar lordosis, and increased thoracic kyphosis  PELVIC ALIGNMENT:   LOWER EXTREMITY ROM: full bilateral hip ROM   LOWER EXTREMITY MMT: 4/5 through out extremities   PALPATION:   General  Bulge lower abdomen when contract the abdominals                External Perineal Exam intact, slight redness around the introitus                             Internal Pelvic Floor tightness throughout the pelvic floor, decreased tissue mobility posterior vaginal canal, therapist only able to place her index finger to the PIP due to shorten vaginal canal.   Patient confirms identification and approves PT to assess internal pelvic floor and treatment Yes  PELVIC MMT:    MMT eval 05/16/23  Vaginal 2/5 with hug of therapist finger but not able to bulge 2/5 with weak hug   (Blank rows = not tested)        TONE: increased    TODAY'S TREATMENT:   05/16/23 Manual: Myofascial release: Fascial release along the left urogenital diaphragm and perineal body, along the left ischiocavernosus and clitoral hood Internal pelvic floor techniques: No emotional/communication barriers or cognitive limitation. Patient is motivated to learn. Patient understands and agrees with treatment goals and plan. PT explains patient will be examined in standing, sitting, and lying down to see how their muscles and joints work. When they are ready, they will be asked to remove their underwear so PT can examine their perineum. The patient is also given the option of providing their own chaperone as one is not provided in our facility. The patient also has the right and is explained the right to defer or refuse  any part of the evaluation or treatment including the internal exam. With the patient's consent, PT will use one gloved finger to gently assess the muscles of the pelvic floor, seeing how well it contracts and relaxes and if there is muscle symmetry. After, the patient will get dressed and PT and patient will discuss exam findings and plan of care. PT and patient discuss plan of care, schedule, attendance policy and HEP activities.  Going through the vagina working on the levator ani and obturator ani on the left, posterior vaginal canal, and around the urethra Neuromuscular re-education: Pelvic floor contraction training: Therapist finger in the vaginal canal working on pelvic floor contraction using tactile cues Diaphragmatic breathing in sitting to relax the pelvic floor and facilitate her on the commode.  Educated patient on how to use vaginal moisturizers to improve tissue health of the vulva and reduce dryness.  POSTURE Educated patient on sitting with slight anterior pelvic tile  instead of posterior to reduce tension in the pelvic floor and had her demonstrate Educated patient on keeping the distance between the rib cage and pubic bone to reduce pressure on the pelvic floor                                                                                                                                  PATIENT EDUCATION:  05/16/23 Education details: Access Code: [email or web address], educated on vaginal moisturizers Person educated: Patient Education method: Explanation, Demonstration, Tactile cues, Verbal cues, and Handouts Education comprehension: verbalized understanding, returned demonstration, verbal cues required, tactile cues required, and needs further education  HOME EXERCISE PROGRAM: 05/09/23 Access Code: [email or web address] URL: [email or web address] Date: 05/09/2023 Prepared by: Eulis Foster  Exercises - Supine Diaphragmatic Breathing  - 2 x daily - 7 x weekly - 1 sets - 10 reps - Seated Diaphragmatic Breathing  - 1 x daily - 7 x weekly - 1 sets - 10 reps  ASSESSMENT:  CLINICAL IMPRESSION: Patient is a 77 y.o. female who was seen today for physical therapy  treatment for stress incontinence, vaginal pain and pelvic floor dysfunction. Patient continues to have 2/5 pelvic floor strength. She had improve elongation of the left urogenital diaphragm. She has a short vaginal canal. Patient was able to feel her pelvic floor relax with diaphragmatic breathing in sitting.  She will bulge her lower abdomen when contracting the abdominals. Patient will benefit from skilled therapy to improve pelvic floor elongation and coordination.      OBJECTIVE IMPAIRMENTS: decreased activity tolerance, decreased coordination, decreased endurance, decreased strength, increased fascial restrictions, and increased muscle spasms.   ACTIVITY LIMITATIONS: continence and toileting  PARTICIPATION LIMITATIONS: community activity  PERSONAL FACTORS: Age, Fitness, and 3+ comorbidities: Left  breast cancer; Hypothyroidism;Graves disease; Abdominal Hysterectomy; Anterior cervical discectomy fusion;; Lumbar spine surgery; Endometrial cancer with radical hysterectomy.  are also affecting patient's functional outcome.   REHAB POTENTIAL: Good  CLINICAL DECISION MAKING: Evolving/moderate complexity  EVALUATION COMPLEXITY: Moderate  GOALS: Goals reviewed with patient? Yes  SHORT TERM GOALS: Target date: 10/14/2  Patient educated on pelvic and hip stretches to elongate the pelvic floor.  Baseline: Goal status: ongoing 05/16/23  2.  Patient is able to perform diaphragmatic breathing to bulge the pelvic floor.  Baseline:  Goal status: ongoing 05/16/23  3.  Patient educated in ways to relax the pelvic floor when urinating.  Baseline:  Goal status: INITIAL   LONG TERM GOALS: Target date: 07/04/23  Patient educated in HEP Baseline:  Goal status: INITIAL  2.  Patient educated in vaginal trainers to elongate the vaginal canal and reduce vaginal tightness.  Baseline:  Goal status: INITIAL  3.  Patient pelvic floor strength is >/= 3/5 with full contraction and full relaxation.  Baseline:  Goal status: INITIAL  4.  Patient is able to fully empty her bladder when urinating prior to going to bed due to the relaxation of the pelvic floor.  Baseline:  Goal status: INITIAL   PLAN:  PT FREQUENCY: 1x/week  PT DURATION: 8 weeks  PLANNED INTERVENTIONS: Therapeutic exercises, Therapeutic activity, Neuromuscular re-education, Patient/Family education, Dry Needling, Electrical stimulation, Cryotherapy, Moist heat, Biofeedback, and Manual therapy  PLAN FOR NEXT SESSION: manual work to the pelvic floor first externally then internally on the right, hip and pelvic floor stretches, diaphragmatic breathing   Eulis Foster, PT 05/16/23 2:57 PM

## 2023-05-17 ENCOUNTER — Ambulatory Visit: Payer: Medicare Other | Admitting: Cardiology

## 2023-05-17 DIAGNOSIS — I10 Essential (primary) hypertension: Secondary | ICD-10-CM | POA: Diagnosis not present

## 2023-05-17 DIAGNOSIS — Z6824 Body mass index (BMI) 24.0-24.9, adult: Secondary | ICD-10-CM | POA: Diagnosis not present

## 2023-05-17 DIAGNOSIS — E039 Hypothyroidism, unspecified: Secondary | ICD-10-CM | POA: Diagnosis not present

## 2023-05-17 DIAGNOSIS — J180 Bronchopneumonia, unspecified organism: Secondary | ICD-10-CM | POA: Diagnosis not present

## 2023-05-17 DIAGNOSIS — Z79899 Other long term (current) drug therapy: Secondary | ICD-10-CM | POA: Diagnosis not present

## 2023-05-17 DIAGNOSIS — M25473 Effusion, unspecified ankle: Secondary | ICD-10-CM | POA: Diagnosis not present

## 2023-05-17 DIAGNOSIS — K219 Gastro-esophageal reflux disease without esophagitis: Secondary | ICD-10-CM | POA: Diagnosis not present

## 2023-05-17 DIAGNOSIS — M5137 Other intervertebral disc degeneration, lumbosacral region: Secondary | ICD-10-CM | POA: Diagnosis not present

## 2023-05-17 DIAGNOSIS — R062 Wheezing: Secondary | ICD-10-CM | POA: Diagnosis not present

## 2023-05-18 LAB — LAB REPORT - SCANNED: EGFR: 86

## 2023-05-23 ENCOUNTER — Encounter: Payer: Medicare Other | Admitting: Physical Therapy

## 2023-05-30 ENCOUNTER — Encounter: Payer: Medicare Other | Attending: Obstetrics and Gynecology | Admitting: Physical Therapy

## 2023-05-30 ENCOUNTER — Encounter: Payer: Self-pay | Admitting: Physical Therapy

## 2023-05-30 DIAGNOSIS — R252 Cramp and spasm: Secondary | ICD-10-CM

## 2023-05-30 DIAGNOSIS — R102 Pelvic and perineal pain: Secondary | ICD-10-CM | POA: Insufficient documentation

## 2023-05-30 DIAGNOSIS — N393 Stress incontinence (female) (male): Secondary | ICD-10-CM | POA: Insufficient documentation

## 2023-05-30 DIAGNOSIS — M6289 Other specified disorders of muscle: Secondary | ICD-10-CM | POA: Diagnosis not present

## 2023-05-30 DIAGNOSIS — R278 Other lack of coordination: Secondary | ICD-10-CM

## 2023-05-30 NOTE — Therapy (Signed)
OUTPATIENT PHYSICAL THERAPY FEMALE PELVIC TREATMENT   Patient Name: Karen Allison MRN: [id number] DOB:05-Oct-1945, 77 y.o., female Today's Date: 05/30/2023  END OF SESSION:  PT End of Session - 05/30/23 1139     Visit Number 3    Date for PT Re-Evaluation 07/04/23    Authorization Type medicare    Authorization - Visit Number 3    Authorization - Number of Visits 10    PT Start Time 1130    PT Stop Time 1215    PT Time Calculation (min) 45 min    Activity Tolerance Patient tolerated treatment well    Behavior During Therapy WFL for tasks assessed/performed             Past Medical History:  Diagnosis Date   A-fib (HCC)    Arthritis    Blood dyscrasia    bleeds and bruises easily   Breast cancer (HCC)    left   Gait disorder 07/05/2016   Hepatitis     Hep A years ago   Hypertension    Hypothyroidism    Graves disease, age 55 yo   Nose trouble    right nostril will hemorrhage at times   Pneumonia    Rosacea    Secondary malignant neoplasm of liver [facility])    Past Surgical History:  Procedure Laterality Date   ABDOMINAL HYSTERECTOMY     Age 23   ANTERIOR CERVICAL DECOMP/DISCECTOMY FUSION N/A 05/06/2014   Procedure: ANTERIOR CERVICAL DISCECTOMY FUSION C3-4 with transgraft, local bone graft, plate and screws;  Surgeon: Kerrin Champagne, MD;  Location: [facility] OR;  Service: Orthopedics;  Laterality: N/A;   APPENDECTOMY     BREAST LUMPECTOMY Left    COLONOSCOPY     EYE SURGERY Bilateral    cataracts   LUMBAR SPINE SURGERY  2011   Removal of hematoma   ORIF HUMERUS FRACTURE Left 2013   THORACIC SPINE SURGERY  2011   Bone cages   THYROIDECTOMY     TONSILLECTOMY     Patient Active Problem List   Diagnosis Date Noted   History of corticosteroid therapy 02/13/2023   History of endocrine disorder 02/13/2023   Rib pain on left side 09/26/2022   Hyponatremia 12/02/2021    Class: Chronic   Graves' disease 07/16/2021   Osteoporosis 11/04/2020    Class: Chronic    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   Malignant neoplasm of left breast (HCC) 06/05/2020   Other forms of scoliosis, thoracolumbar region 11/03/2016    Class: Chronic   Gait disorder 07/05/2016   HCAP (healthcare-associated pneumonia) 05/09/2014   Acute respiratory failure with hypoxia (HCC) 05/09/2014   Pneumonia 05/09/2014   Hypertension    Hypothyroidism    Cervical stenosis (uterine cervix) 05/07/2014   Spinal stenosis in cervical region 05/06/2014    Class: Chronic   Cervical spondylosis without myelopathy 05/06/2014   HNP (herniated nucleus pulposus), cervical 05/06/2014   Adult hypothyroidism 05/03/2013   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 Straining to urinate is 30% better. Pelvic pain is 40% better with more on the left.  Still have the same tenderness located on the left side.  Fluid intake: Yes: water, coffee    PAIN:  Are you having pain? No  PRECAUTIONS: Other: breast cancer  RED FLAGS: None   WEIGHT BEARING RESTRICTIONS: No  FALLS:  Has patient fallen in last 6 months? Yes. Number of falls fell in May with some breaks. She has been to physical therapy to work on strength and balance  LIVING ENVIRONMENT: Lives  with: lives with their spouse   OCCUPATION: retired  PLOF: Independent  PATIENT GOALS: void urine without straining  PERTINENT HISTORY:  Left breast cancer; Hypothyroidism;Graves disease; Abdominal Hysterectomy; Anterior cervical discectomy fusion;; Lumbar spine surgery; Endometrial cancer with radical hysterectomy.   BOWEL MOVEMENT: managing bowel movements with fiber   URINATION: Pain with urination: No Fully empty bladder: Yes: At night need to urinate multiple times in a row, and to push on her belly or vagina to empty bladder  Stream: Weak Urgency: No Frequency: Day time voids 6-8.  Nocturia: infrequent times per night to void.  Leakage: Coughing and Sneezing Pads: Yes: 2 pads per day  INTERCOURSE:Not active  PREGNANCY: Vaginal deliveries 1  PROLAPSE: None   OBJECTIVE:   DIAGNOSTIC FINDINGS:  Pelvic floor strength II/V  Pelvic floor musculature: Right levator tender, Right obturator tender, Left levator tender, Left obturator tender  PVR of 44 ml was obtained by bladder scan.  Positive CST on exam    COGNITION: Overall cognitive status: Within functional limits for tasks assessed     SENSATION: Light touch: Appears intact Proprioception: Appears intact   GAIT: Assistive device utilized:  3 wheeled walker Level of assistance: Complete Independence   POSTURE: rounded shoulders, forward head, decreased lumbar lordosis, and increased thoracic kyphosis  PELVIC ALIGNMENT:   LOWER EXTREMITY ROM: full bilateral hip ROM   LOWER EXTREMITY MMT: 4/5 through out extremities   PALPATION:   General  Bulge lower abdomen when contract the abdominals                External Perineal Exam intact, slight redness around the introitus                             Internal Pelvic Floor tightness throughout the pelvic floor, decreased tissue mobility posterior vaginal canal, therapist only able to place her index finger to the PIP due to shorten vaginal canal.    Patient confirms identification and approves PT to assess internal pelvic floor and treatment Yes  PELVIC MMT:   MMT eval 05/16/23 05/30/23  Vaginal 2/5 with hug of therapist finger but not able to bulge 2/5 with weak hug  3/5 with weak lift holding 10 sec  (Blank rows = not tested)        TONE: increased    TODAY'S TREATMENT:   05/30/23 Manual: Soft tissue mobilization: Manual work along the left urogenital diaphragm and ischiocavernosus.  Internal pelvic floor techniques: No emotional/communication barriers or cognitive limitation. Patient is motivated to learn. Patient understands and agrees with treatment goals and plan. PT explains patient will be examined in standing, sitting, and lying down to see how their muscles and joints work. When they are ready, they will be asked to remove their underwear so PT can examine their perineum. The patient is also given the option  of providing their own chaperone as one is not provided in our facility. The patient also has the right and is explained the right to defer or refuse any part of the evaluation or treatment including the internal exam. With the patient's consent, PT will use one gloved finger to gently assess the muscles of the pelvic floor, seeing how well it contracts and relaxes and if there is muscle symmetry. After, the patient will get dressed and PT and patient will discuss exam findings and plan of care. PT and patient discuss plan of care, schedule, attendance policy and HEP activities.  Going through the vaginal canal working on the left levator ani and obturator internist, along the inferior lateral introitus then along the sides of the bladder Exercises: Strengthening: Sitting pelvic floor contraction holding for 10 sec 5 times then 5 quick flicks.  Therapist finger in the vaginal canal working on quick flicks and holding contraction for 10 sec    05/16/23 Manual: Myofascial release: Fascial release along the left  urogenital diaphragm and perineal body, along the left ischiocavernosus and clitoral hood Internal pelvic floor techniques: No emotional/communication barriers or cognitive limitation. Patient is motivated to learn. Patient understands and agrees with treatment goals and plan. PT explains patient will be examined in standing, sitting, and lying down to see how their muscles and joints work. When they are ready, they will be asked to remove their underwear so PT can examine their perineum. The patient is also given the option of providing their own chaperone as one is not provided in our facility. The patient also has the right and is explained the right to defer or refuse any part of the evaluation or treatment including the internal exam. With the patient's consent, PT will use one gloved finger to gently assess the muscles of the pelvic floor, seeing how well it contracts and relaxes and if there is muscle symmetry. After, the patient will get dressed and PT and patient will discuss exam findings and plan of care. PT and patient discuss plan of care, schedule, attendance policy and HEP activities.  Going through the vagina working on the levator ani and obturator ani on the left, posterior vaginal canal, and around the urethra Neuromuscular re-education: Pelvic floor contraction training: Therapist finger in the vaginal canal working on pelvic floor contraction using tactile cues Diaphragmatic breathing in sitting to relax the pelvic floor and facilitate her on the commode.  Educated patient on how to use vaginal moisturizers to improve tissue health of the vulva and reduce dryness.  POSTURE Educated patient on sitting with slight anterior pelvic tile instead of posterior to reduce tension in the pelvic floor and had her demonstrate Educated patient on keeping the distance between the rib cage and pubic bone to reduce pressure on the pelvic floor  PATIENT EDUCATION:  05/30/23 Education details: Access Code: [email or web address], educated on vaginal moisturizers Person educated: Patient Education method: Explanation, Demonstration, Tactile cues, Verbal cues, and Handouts Education comprehension: verbalized understanding, returned demonstration, verbal cues required, tactile cues required, and needs further education  HOME EXERCISE PROGRAM: 05/30/23 Access Code: [email or web address] URL: [email or web address] Date: 05/30/2023 Prepared by: Eulis Foster  Exercises - Supine Diaphragmatic Breathing  - 2 x daily - 7 x weekly - 1 sets - 10 reps - Seated Diaphragmatic Breathing  - 1 x daily - 7 x weekly - 1 sets - 10 reps - Seated Pelvic Floor Contraction  - 3 x daily - 7 x weekly - 1 sets - 5 reps - 10 sec hold - Quick Flick Pelvic Floor Contractions Seated    - 3 x daily - 7 x weekly - 1 sets - 5 reps  ASSESSMENT:  CLINICAL IMPRESSION: Patient is a 77 y.o. female who was seen today for physical therapy  treatment for stress incontinence, vaginal pain and pelvic floor dysfunction.Straining to urinate is 30% better.  Pelvic pain is 40% better with more on the left. Pelvic floor strength is 3/5 with a weak hug. Patient will benefit from skilled therapy to improve pelvic floor elongation and coordination.      OBJECTIVE IMPAIRMENTS: decreased activity tolerance, decreased coordination, decreased endurance, decreased strength, increased fascial restrictions, and increased muscle spasms.   ACTIVITY LIMITATIONS: continence and toileting  PARTICIPATION LIMITATIONS: community activity  PERSONAL FACTORS: Age, Fitness, and 3+ comorbidities: Left breast cancer; Hypothyroidism;Graves disease; Abdominal Hysterectomy; Anterior cervical discectomy fusion;; Lumbar spine surgery; Endometrial cancer with radical hysterectomy.  are also affecting patient's functional outcome.   REHAB  POTENTIAL: Good  CLINICAL DECISION MAKING: Evolving/moderate complexity  EVALUATION COMPLEXITY: Moderate   GOALS: Goals reviewed with patient? Yes  SHORT TERM GOALS: Target date: 10/14/2  Patient educated on pelvic and hip stretches to elongate the pelvic floor.  Baseline: Goal status: ongoing 05/16/23  2.  Patient is able to perform diaphragmatic breathing to bulge the pelvic floor.  Baseline:  Goal status: ongoing 05/16/23  3.  Patient educated in ways to relax the pelvic floor when urinating.  Baseline:  Goal status: INITIAL   LONG TERM GOALS: Target date: 07/04/23  Patient educated in HEP Baseline:  Goal status: INITIAL  2.  Patient educated in vaginal trainers to elongate the vaginal canal and reduce vaginal tightness.  Baseline:  Goal status: INITIAL  3.  Patient pelvic floor strength is >/= 3/5 with full contraction and full relaxation.  Baseline:  Goal status: INITIAL  4.  Patient is able to fully empty her bladder when urinating prior to going to bed due to the relaxation of the pelvic floor.  Baseline:  Goal status: INITIAL   PLAN:  PT FREQUENCY: 1x/week  PT DURATION: 8 weeks  PLANNED INTERVENTIONS: Therapeutic exercises, Therapeutic activity, Neuromuscular re-education, Patient/Family education, Dry Needling, Electrical stimulation, Cryotherapy, Moist heat, Biofeedback, and Manual therapy  PLAN FOR NEXT SESSION: manual work to the pelvic floor first externally then internally on the right, hip and pelvic floor stretches, diaphragmatic breathing, discuss vaginal trainers   Eulis Foster, PT 05/30/23 2:52 PM

## 2023-06-02 ENCOUNTER — Inpatient Hospital Stay: Payer: Medicare Other | Attending: Oncology

## 2023-06-02 VITALS — BP 167/60 | HR 64 | Temp 97.9°F | Resp 18 | Ht 60.02 in | Wt 136.1 lb

## 2023-06-02 DIAGNOSIS — C50912 Malignant neoplasm of unspecified site of left female breast: Secondary | ICD-10-CM | POA: Insufficient documentation

## 2023-06-02 DIAGNOSIS — Z5112 Encounter for antineoplastic immunotherapy: Secondary | ICD-10-CM | POA: Diagnosis not present

## 2023-06-02 DIAGNOSIS — M810 Age-related osteoporosis without current pathological fracture: Secondary | ICD-10-CM | POA: Insufficient documentation

## 2023-06-02 DIAGNOSIS — Z79899 Other long term (current) drug therapy: Secondary | ICD-10-CM | POA: Insufficient documentation

## 2023-06-02 DIAGNOSIS — E871 Hypo-osmolality and hyponatremia: Secondary | ICD-10-CM | POA: Insufficient documentation

## 2023-06-02 DIAGNOSIS — Z170 Estrogen receptor positive status [ER+]: Secondary | ICD-10-CM | POA: Insufficient documentation

## 2023-06-02 DIAGNOSIS — C7800 Secondary malignant neoplasm of unspecified lung: Secondary | ICD-10-CM | POA: Diagnosis not present

## 2023-06-02 DIAGNOSIS — C787 Secondary malignant neoplasm of liver and intrahepatic bile duct: Secondary | ICD-10-CM | POA: Insufficient documentation

## 2023-06-02 DIAGNOSIS — Z79620 Long term (current) use of immunosuppressive biologic: Secondary | ICD-10-CM | POA: Diagnosis not present

## 2023-06-02 MED ORDER — SODIUM CHLORIDE 0.9 % IV SOLN: Freq: Once | INTRAVENOUS | Status: AC

## 2023-06-02 MED ORDER — TRASTUZUMAB-ANNS CHEMO 420 MG IV SOLR
6.0000 mg/kg | Freq: Once | INTRAVENOUS | Status: AC
  Administered 2023-06-02: 378 mg via INTRAVENOUS
  Filled 2023-06-02: qty 18

## 2023-06-02 NOTE — Patient Instructions (Signed)

## 2023-06-07 DIAGNOSIS — Z23 Encounter for immunization: Secondary | ICD-10-CM | POA: Diagnosis not present

## 2023-06-13 ENCOUNTER — Encounter: Payer: Self-pay | Admitting: Physical Therapy

## 2023-06-13 ENCOUNTER — Encounter: Payer: Medicare Other | Admitting: Physical Therapy

## 2023-06-13 DIAGNOSIS — N393 Stress incontinence (female) (male): Secondary | ICD-10-CM | POA: Diagnosis not present

## 2023-06-13 DIAGNOSIS — R102 Pelvic and perineal pain: Secondary | ICD-10-CM | POA: Diagnosis not present

## 2023-06-13 DIAGNOSIS — R252 Cramp and spasm: Secondary | ICD-10-CM

## 2023-06-13 DIAGNOSIS — R278 Other lack of coordination: Secondary | ICD-10-CM

## 2023-06-13 DIAGNOSIS — M6289 Other specified disorders of muscle: Secondary | ICD-10-CM | POA: Diagnosis not present

## 2023-06-13 NOTE — Therapy (Signed)
OUTPATIENT PHYSICAL THERAPY FEMALE PELVIC TREATMENT   Patient Name: Karen Allison MRN: [id number] DOB:September 26, 1945, 77 y.o., female Today's Date: 06/13/2023  END OF SESSION:  PT End of Session - 06/13/23 1406     Visit Number 4    Date for PT Re-Evaluation 07/04/23    Authorization Type medicare    Authorization - Visit Number 4    Authorization - Number of Visits 10    PT Start Time 1400    PT Stop Time 1445    PT Time Calculation (min) 45 min    Activity Tolerance Patient tolerated treatment well    Behavior During Therapy WFL for tasks assessed/performed             Past Medical History:  Diagnosis Date   A-fib (HCC)    Arthritis    Blood dyscrasia    bleeds and bruises easily   Breast cancer (HCC)    left   Gait disorder 07/05/2016   Hepatitis     Hep A years ago   Hypertension    Hypothyroidism    Graves disease, age 72 yo   Nose trouble    right nostril will hemorrhage at times   Pneumonia    Rosacea    Secondary malignant neoplasm of liver [facility])    Past Surgical History:  Procedure Laterality Date   ABDOMINAL HYSTERECTOMY     Age 48   ANTERIOR CERVICAL DECOMP/DISCECTOMY FUSION N/A 05/06/2014   Procedure: ANTERIOR CERVICAL DISCECTOMY FUSION C3-4 with transgraft, local bone graft, plate and screws;  Surgeon: Kerrin Champagne, MD;  Location: [facility] OR;  Service: Orthopedics;  Laterality: N/A;   APPENDECTOMY     BREAST LUMPECTOMY Left    COLONOSCOPY     EYE SURGERY Bilateral    cataracts   LUMBAR SPINE SURGERY  2011   Removal of hematoma   ORIF HUMERUS FRACTURE Left 2013   THORACIC SPINE SURGERY  2011   Bone cages   THYROIDECTOMY     TONSILLECTOMY     Patient Active Problem List   Diagnosis Date Noted   History of corticosteroid therapy 02/13/2023   History of endocrine disorder 02/13/2023   Rib pain on left side 09/26/2022   Hyponatremia 12/02/2021    Class: Chronic   Graves' disease 07/16/2021   Osteoporosis 11/04/2020    Class: Chronic    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   Malignant neoplasm of left breast (HCC) 06/05/2020   Other forms of scoliosis, thoracolumbar region 11/03/2016    Class: Chronic   Gait disorder 07/05/2016   HCAP (healthcare-associated pneumonia) 05/09/2014   Acute respiratory failure with hypoxia (HCC) 05/09/2014   Pneumonia 05/09/2014   Hypertension    Hypothyroidism    Cervical stenosis (uterine cervix) 05/07/2014   Spinal stenosis in cervical region 05/06/2014    Class: Chronic   Cervical spondylosis without myelopathy 05/06/2014   HNP (herniated nucleus pulposus), cervical 05/06/2014   Adult hypothyroidism 05/03/2013   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  Still have the same tenderness located on the left side.  Fluid intake: Yes: water, coffee    PAIN:  Are you having pain? No  PRECAUTIONS: Other: breast cancer  RED FLAGS: None   WEIGHT BEARING RESTRICTIONS: No  FALLS:  Has patient fallen in last 6 months? Yes. Number of falls fell in May with some breaks. She has been to physical therapy to work on strength and balance  LIVING ENVIRONMENT: Lives with: lives with their spouse   OCCUPATION: retired  PLOF:  Independent  PATIENT GOALS: void urine without straining  PERTINENT HISTORY:  Left breast cancer; Hypothyroidism;Graves disease; Abdominal Hysterectomy; Anterior cervical discectomy fusion;; Lumbar spine surgery; Endometrial cancer with radical hysterectomy.   BOWEL MOVEMENT: managing bowel movements with fiber   URINATION: Pain with urination: No Fully empty bladder: Yes: At night need to urinate multiple times in a row, and to push on her belly or vagina to empty bladder  Stream: Weak Urgency: No Frequency: Day time voids 6-8.  Nocturia: infrequent times per night to void.  Leakage: Coughing and Sneezing Pads: Yes: 2 pads per day  INTERCOURSE:Not active  PREGNANCY: Vaginal deliveries 1  PROLAPSE: None   OBJECTIVE:   DIAGNOSTIC FINDINGS:  Pelvic floor strength II/V  Pelvic floor musculature: Right levator tender, Right obturator tender, Left levator tender, Left obturator tender  PVR of 44 ml was obtained by bladder scan.  Positive CST on exam    COGNITION: Overall cognitive status: Within functional limits for tasks assessed     SENSATION: Light touch: Appears intact Proprioception: Appears intact   GAIT: Assistive device utilized:  3 wheeled walker Level of assistance: Complete Independence   POSTURE: rounded shoulders, forward head, decreased lumbar lordosis, and increased thoracic kyphosis  PELVIC ALIGNMENT:   LOWER EXTREMITY ROM: full bilateral hip ROM   LOWER EXTREMITY MMT: 4/5 through out extremities   PALPATION:   General  Bulge lower abdomen when contract the abdominals                External Perineal Exam intact, slight redness around the introitus                             Internal Pelvic Floor tightness throughout the pelvic floor, decreased tissue mobility posterior vaginal canal, therapist only able to place her index finger to the PIP due to shorten vaginal canal.   Patient confirms identification and approves PT to assess  internal pelvic floor and treatment Yes  PELVIC MMT:   MMT eval 05/16/23 05/30/23 06/13/23  Vaginal 2/5 with hug of therapist finger but not able to bulge 2/5 with weak hug  3/5 with weak lift holding 10 sec 3/5 with weakn hug and lift  (Blank rows = not tested)        TONE: increased    TODAY'S TREATMENT:   06/13/23 Manual: Internal pelvic floor techniques: No emotional/communication barriers or cognitive limitation. Patient is motivated to learn. Patient understands and agrees with treatment goals and plan. PT explains patient will be examined in standing, sitting, and lying down to see how their muscles and joints work. When they are ready, they will be asked to remove their underwear so PT can examine their perineum. The patient is also given the option of providing their own chaperone as one is not provided in our facility. The patient also has the right and is explained the  right to defer or refuse any part of the evaluation or treatment including the internal exam. With the patient's consent, PT will use one gloved finger to gently assess the muscles of the pelvic floor, seeing how well it contracts and relaxes and if there is muscle symmetry. After, the patient will get dressed and PT and patient will discuss exam findings and plan of care. PT and patient discuss plan of care, schedule, attendance policy and HEP activities.  Going through the vaginal canal working on the posterior vaginal canal, posterior introitus, along the levator ani and obturator internist and apex of the vaginal canal Neuromuscular re-education: Pelvic floor contraction training: Pelvic floor contraction in supine with therapist finger in the vaginal canal giving tactile cues for contraction, tried to take finger out of the canal and having patient not let me pull my finger.  Having patient blow into her curled fingers to generate pressure to push urine out for urination. She was able to do this after 5  tries Exercises: Stretches/mobility: Diaphragmatic breathing in sitting and can feel the pelvic floor relax 10 x Sitting pelvic tilt, pelvic sway and cat cow to do when urinating to relax the pelvic floor.  Strengthening: Sitting quick contraction 5 times but hard for patient      05/30/23 Manual: Soft tissue mobilization: Manual work along the left urogenital diaphragm and ischiocavernosus.  Internal pelvic floor techniques: No emotional/communication barriers or cognitive limitation. Patient is motivated to learn. Patient understands and agrees with treatment goals and plan. PT explains patient will be examined in standing, sitting, and lying down to see how their muscles and joints work. When they are ready, they will be asked to remove their underwear so PT can examine their perineum. The patient is also given the option of providing their own chaperone as one is not provided in our facility. The patient also has the right and is explained the right to defer or refuse any part of the evaluation or treatment including the internal exam. With the patient's consent, PT will use one gloved finger to gently assess the muscles of the pelvic floor, seeing how well it contracts and relaxes and if there is muscle symmetry. After, the patient will get dressed and PT and patient will discuss exam findings and plan of care. PT and patient discuss plan of care, schedule, attendance policy and HEP activities.  Going through the vaginal canal working on the left levator ani and obturator internist, along the inferior lateral introitus then along the sides of the bladder Exercises: Strengthening: Sitting pelvic floor contraction holding for 10 sec 5 times then 5 quick flicks.  Therapist finger in the vaginal canal working on quick flicks and holding contraction for 10 sec    05/16/23 Manual: Myofascial release: Fascial release along the left urogenital diaphragm and perineal body, along the left  ischiocavernosus and clitoral hood Internal pelvic floor techniques: No emotional/communication barriers or cognitive limitation. Patient is motivated to learn. Patient understands and agrees with treatment goals and plan. PT explains patient will be examined in standing, sitting, and lying down to see how their muscles and joints work. When they are ready, they will be asked to remove their underwear so PT can examine their perineum. The patient is also given the option of providing their own chaperone as one is not provided in our facility. The patient also has the right and is explained the right to defer or refuse any part of the evaluation or treatment including the internal exam.  With the patient's consent, PT will use one gloved finger to gently assess the muscles of the pelvic floor, seeing how well it contracts and relaxes and if there is muscle symmetry. After, the patient will get dressed and PT and patient will discuss exam findings and plan of care. PT and patient discuss plan of care, schedule, attendance policy and HEP activities.  Going through the vagina working on the levator ani and obturator ani on the left, posterior vaginal canal, and around the urethra Neuromuscular re-education: Pelvic floor contraction training: Therapist finger in the vaginal canal working on pelvic floor contraction using tactile cues Diaphragmatic breathing in sitting to relax the pelvic floor and facilitate her on the commode.  Educated patient on how to use vaginal moisturizers to improve tissue health of the vulva and reduce dryness.  POSTURE Educated patient on sitting with slight anterior pelvic tile instead of posterior to reduce tension in the pelvic floor and had her demonstrate Educated patient on keeping the distance between the rib cage and pubic bone to reduce pressure on the pelvic floor                                                                                                                                   PATIENT EDUCATION:  06/13/23 Education details: Access Code: [email or web address], educated on vaginal moisturizers Person educated: Patient Education method: Explanation, Demonstration, Tactile cues, Verbal cues, and Handouts Education comprehension: verbalized understanding, returned demonstration, verbal cues required, tactile cues required, and needs further education  HOME EXERCISE PROGRAM: 06/13/23 Access Code: [email or web address] URL: [email or web address] Date: 06/13/2023 Prepared by: Eulis Foster  Exercises - Supine Diaphragmatic Breathing  - 2 x daily - 7 x weekly - 1 sets - 10 reps - Seated Diaphragmatic Breathing  - 1 x daily - 7 x weekly - 1 sets - 10 reps - Seated Pelvic Floor Contraction  - 3 x daily - 7 x weekly - 1 sets - 5 reps - 10 sec hold - Quick Flick Pelvic Floor Contractions Seated    - 3 x daily - 7 x weekly - 1 sets - 5 reps - Pelvic Circles on Swiss Ball  - 1 x daily - 7 x weekly - 10 reps - Seated Cat Cow  - 1 x daily - 7 x weekly - 1 sets - 10 reps - Seated Lateral Pelvic Tilt on Swiss Ball  - 1 x daily - 7 x weekly - 1 sets - 10 reps  ASSESSMENT:  CLINICAL IMPRESSION: Patient is a 77 y.o. female who was seen today for physical therapy  treatment for stress incontinence, vaginal pain and pelvic floor dysfunction.Straining to urinate is 30% better.  Pelvic pain is 40% better with more on the left. Pelvic floor strength is 3/5 with a weak hug. Patient is able to bulge her pelvic floor while urinating. Patient was able to relax her pelvic floor when simulating her pushing out urine when  she blows into her fist.   Patient will benefit from skilled therapy to improve pelvic floor elongation and coordination.      OBJECTIVE IMPAIRMENTS: decreased activity tolerance, decreased coordination, decreased endurance, decreased strength, increased fascial restrictions, and increased muscle spasms.   ACTIVITY LIMITATIONS: continence and  toileting  PARTICIPATION LIMITATIONS: community activity  PERSONAL FACTORS: Age, Fitness, and 3+ comorbidities: Left breast cancer; Hypothyroidism;Graves disease; Abdominal Hysterectomy; Anterior cervical discectomy fusion;; Lumbar spine surgery; Endometrial cancer with radical hysterectomy.  are also affecting patient's functional outcome.   REHAB POTENTIAL: Good  CLINICAL DECISION MAKING: Evolving/moderate complexity  EVALUATION COMPLEXITY: Moderate   GOALS: Goals reviewed with patient? Yes  SHORT TERM GOALS: Target date: 10/14/2  Patient educated on pelvic and hip stretches to elongate the pelvic floor.  Baseline: Goal status: Met 06/13/23  2.  Patient is able to perform diaphragmatic breathing to bulge the pelvic floor.  Baseline:  Goal status: Met 06/13/23  3.  Patient educated in ways to relax the pelvic floor when urinating.  Baseline:  Goal status: Met 06/13/23   LONG TERM GOALS: Target date: 07/04/23  Patient educated in HEP Baseline:  Goal status: INITIAL  2.  Patient educated in vaginal trainers to elongate the vaginal canal and reduce vaginal tightness.  Baseline:  Goal status: defer 06/13/23  3.  Patient pelvic floor strength is >/= 3/5 with full contraction and full relaxation.  Baseline:  Goal status: INITIAL  4.  Patient is able to fully empty her bladder when urinating prior to going to bed due to the relaxation of the pelvic floor.  Baseline:  Goal status: INITIAL   PLAN:  PT FREQUENCY: 1x/week  PT DURATION: 8 weeks  PLANNED INTERVENTIONS: Therapeutic exercises, Therapeutic activity, Neuromuscular re-education, Patient/Family education, Dry Needling, Electrical stimulation, Cryotherapy, Moist heat, Biofeedback, and Manual therapy  PLAN FOR NEXT SESSION: manual work to the pelvic floor first externally then internally on the right, hip and pelvic floor stretches, diaphragmatic breathing,    Eulis Foster, PT 06/13/23 2:49 PM

## 2023-06-15 DIAGNOSIS — C50919 Malignant neoplasm of unspecified site of unspecified female breast: Secondary | ICD-10-CM | POA: Insufficient documentation

## 2023-06-15 DIAGNOSIS — D759 Disease of blood and blood-forming organs, unspecified: Secondary | ICD-10-CM | POA: Insufficient documentation

## 2023-06-15 DIAGNOSIS — J349 Unspecified disorder of nose and nasal sinuses: Secondary | ICD-10-CM | POA: Insufficient documentation

## 2023-06-15 DIAGNOSIS — M1990 Unspecified osteoarthritis, unspecified site: Secondary | ICD-10-CM | POA: Insufficient documentation

## 2023-06-15 DIAGNOSIS — K759 Inflammatory liver disease, unspecified: Secondary | ICD-10-CM | POA: Insufficient documentation

## 2023-06-15 DIAGNOSIS — L719 Rosacea, unspecified: Secondary | ICD-10-CM | POA: Insufficient documentation

## 2023-06-15 DIAGNOSIS — I4891 Unspecified atrial fibrillation: Secondary | ICD-10-CM | POA: Insufficient documentation

## 2023-06-15 NOTE — Progress Notes (Signed)
Cardiology Office Note:    Date:  06/16/2023   ID:  Neviyah, Talon 01/17/1946, MRN [id number]  PCP:  Philemon Kingdom, MD  Cardiologist:  Norman Herrlich, MD    Referring MD: Philemon Kingdom, MD    ASSESSMENT:    1. Longstanding persistent atrial fibrillation (HCC)   2. Primary hypertension   3. Hyponatremia    PLAN:    In order of problems listed above:  Stable asymptomatic rate is controlled with her beta-blocker regard utilize reduced dose anticoagulant for stroke prophylaxis stop aspirin Continue to trend home blood pressures I am not can add a second agent at this time Stable hyponatremia Recheck ejection fraction echocardiogram   Next appointment: 3 months   Medication Adjustments/Labs and Tests Ordered: Current medicines are reviewed at length with the patient today.  Concerns regarding medicines are outlined above.  No orders of the defined types were placed in this encounter.  No orders of the defined types were placed in this encounter.    History of Present Illness:    Zitong Popko is a 77 y.o. female with a hx of persistent atrial fibrillation purposely not anticoagulated because of gait dysfunction neuropathy and fall risk, hypertension hyponatremia and malignant neoplasm of the left breast last seen 02/14/2023.  Following the visit she had an echocardiogram reported 04/05/2023 left ventricle normal in size systolic function EF 50-50 5 to 60% normal right ventricular size function but moderate elevation of the pulmonary artery systolic pressure.  Compliance with diet, lifestyle and medications: Yes  Although she does not completely fulfill criteria for reduced dose anticoagulant she is close to 60 kg and weight and she is 77 years old I am concerned about her potential of stroke I think she will have significant survival with her breast cancer she would benefit from anticoagulant therapy and I am going to start her on reduced dose  anticoagulant Eliquis 2 and half milligrams twice daily She is asymptomatic from her atrial fibrillation her home blood pressures run 1 35-1 40 systolic on her beta-blocker She is not having edema shortness of breath chest pain palpitation or syncope Past Medical History:  Diagnosis Date   A-fib (HCC)    Acute respiratory failure with hypoxia (HCC) 05/09/2014   Adult hypothyroidism 05/03/2013   Arthritis    Blood dyscrasia    bleeds and bruises easily   Breast cancer (HCC)    left   Cervical spondylosis without myelopathy 05/06/2014   C3-4     Endometrial cancer (HCC) 01/21/1988   Fx lower humerus-closed 10/22/1997   Gait disorder 07/05/2016   Graves' disease 07/16/2021   HCAP (healthcare-associated pneumonia) 05/09/2014   Hepatitis     Hep A years ago   History of corticosteroid therapy 02/13/2023   History of endocrine disorder 02/13/2023   HNP (herniated nucleus pulposus), cervical 05/06/2014   Hypertension    Hyponatremia 12/02/2021   Hypothyroidism    Graves disease, age 67 yo   Malignant neoplasm of left breast (HCC) 06/05/2020   Nose trouble    right nostril will hemorrhage at times   Osteoporosis 11/04/2020   Other forms of scoliosis, thoracolumbar region 11/03/2016   Pneumonia    Rib pain on left side 09/26/2022   Rosacea    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   B Complex Vitamins (B COMPLEX PO) Take 1 tablet by mouth daily.   Calcium Carbonate-Vitamin D (CALTRATE 600+D PO) Take 1 tablet by mouth 2 (two) times daily.   celecoxib (CELEBREX) 200 MG capsule Take 200 mg by mouth daily.    cholecalciferol (VITAMIN D3) 25 MCG (1000 UNIT) tablet Take 1,000 Units  by mouth daily.   diazepam (VALIUM) 10 MG tablet Take 10 mg by mouth at bedtime.    docusate sodium (COLACE) 100 MG capsule Take 200 mg by mouth 2 (two) times daily as needed for mild constipation.    doxycycline (VIBRAMYCIN) 50 MG capsule Take 50 mg by mouth daily.   estradiol (ESTRACE) 0.1 MG/GM vaginal cream Place 0.5 g vaginally 2 (two) times a week. Place 0.5g nightly for two weeks then twice a week after   fluconazole (DIFLUCAN) 150 MG tablet Take 150 mg by mouth once.   furosemide (LASIX) 20 MG tablet Take 20-40 mg by mouth daily as needed for edema or fluid.   gabapentin (NEURONTIN) 600 MG tablet Take 600-1,200 mg by mouth 4 (four) times daily - after meals and at bedtime. 1 tablet (600 mg) 3 times a day and 2 tablets(1200mg ) at bedtime   HYDROcodone-acetaminophen (NORCO) 10-325 MG tablet Take 1 tablet by mouth every 6 (six) hours as needed for severe pain (pain score 7-10) or moderate pain (pain score 4-6).   levothyroxine (SYNTHROID, LEVOTHROID) 50 MCG tablet Take 50 mcg by mouth daily.   lidocaine-prilocaine (EMLA) cream Apply 1 Application topically as needed.   methocarbamol (ROBAXIN) 500 MG tablet Take 500 mg by mouth 4 (four) times daily.   metoprolol succinate (TOPROL-XL) 25 MG 24 hr tablet Take 1 tablet (25 mg total) by mouth daily.   metroNIDAZOLE (METROCREAM) 0.75 % cream Apply 1 Application topically 2 (two) times daily.   Multiple Vitamin (MULTIVITAMIN WITH MINERALS) TABS Take 1 tablet by mouth daily.   omeprazole (PRILOSEC) 20 MG capsule Take 1 capsule (20 mg total) by mouth 2 (two) times daily before a meal.   ondansetron (ZOFRAN) 8 MG tablet Take 8 mg by mouth 2 (two) times daily as needed for nausea or vomiting.   rosuvastatin (CRESTOR) 10 MG tablet Take 10 mg by mouth once a week.   sennosides-docusate sodium (SENOKOT-S) 8.6-50 MG tablet Take 1 tablet by mouth daily.   trastuzumab (HERCEPTIN) 440 MG injection 440 mg by Intravenous (Continuous Infusion) route every 21 (  twenty-one) days. infusion every 3 weeks      EKGs/Labs/Other Studies Reviewed:    The following studies were reviewed today:  Cardiac Studies & Procedures       ECHOCARDIOGRAM  ECHOCARDIOGRAM COMPLETE 04/05/2023  Narrative ECHOCARDIOGRAM REPORT    Patient Name:   DEJA POLIDORI [facility] Date of Exam: 04/05/2023 Medical Rec #:  [id number]             Height:       60.0 in Accession #:    [id number]            Weight:       135.2 lb Date of Birth:  10/30/1945              BSA:          1.581 m Patient Age:    34 years  Cardiology Office Note:    Date:  06/16/2023   ID:  Neviyah, Talon 01/17/1946, MRN [id number]  PCP:  Philemon Kingdom, MD  Cardiologist:  Norman Herrlich, MD    Referring MD: Philemon Kingdom, MD    ASSESSMENT:    1. Longstanding persistent atrial fibrillation (HCC)   2. Primary hypertension   3. Hyponatremia    PLAN:    In order of problems listed above:  Stable asymptomatic rate is controlled with her beta-blocker regard utilize reduced dose anticoagulant for stroke prophylaxis stop aspirin Continue to trend home blood pressures I am not can add a second agent at this time Stable hyponatremia Recheck ejection fraction echocardiogram   Next appointment: 3 months   Medication Adjustments/Labs and Tests Ordered: Current medicines are reviewed at length with the patient today.  Concerns regarding medicines are outlined above.  No orders of the defined types were placed in this encounter.  No orders of the defined types were placed in this encounter.    History of Present Illness:    Zitong Popko is a 77 y.o. female with a hx of persistent atrial fibrillation purposely not anticoagulated because of gait dysfunction neuropathy and fall risk, hypertension hyponatremia and malignant neoplasm of the left breast last seen 02/14/2023.  Following the visit she had an echocardiogram reported 04/05/2023 left ventricle normal in size systolic function EF 50-50 5 to 60% normal right ventricular size function but moderate elevation of the pulmonary artery systolic pressure.  Compliance with diet, lifestyle and medications: Yes  Although she does not completely fulfill criteria for reduced dose anticoagulant she is close to 60 kg and weight and she is 77 years old I am concerned about her potential of stroke I think she will have significant survival with her breast cancer she would benefit from anticoagulant therapy and I am going to start her on reduced dose  anticoagulant Eliquis 2 and half milligrams twice daily She is asymptomatic from her atrial fibrillation her home blood pressures run 1 35-1 40 systolic on her beta-blocker She is not having edema shortness of breath chest pain palpitation or syncope Past Medical History:  Diagnosis Date   A-fib (HCC)    Acute respiratory failure with hypoxia (HCC) 05/09/2014   Adult hypothyroidism 05/03/2013   Arthritis    Blood dyscrasia    bleeds and bruises easily   Breast cancer (HCC)    left   Cervical spondylosis without myelopathy 05/06/2014   C3-4     Endometrial cancer (HCC) 01/21/1988   Fx lower humerus-closed 10/22/1997   Gait disorder 07/05/2016   Graves' disease 07/16/2021   HCAP (healthcare-associated pneumonia) 05/09/2014   Hepatitis     Hep A years ago   History of corticosteroid therapy 02/13/2023   History of endocrine disorder 02/13/2023   HNP (herniated nucleus pulposus), cervical 05/06/2014   Hypertension    Hyponatremia 12/02/2021   Hypothyroidism    Graves disease, age 67 yo   Malignant neoplasm of left breast (HCC) 06/05/2020   Nose trouble    right nostril will hemorrhage at times   Osteoporosis 11/04/2020   Other forms of scoliosis, thoracolumbar region 11/03/2016   Pneumonia    Rib pain on left side 09/26/2022   Rosacea    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 Longstanding persistent atrial fibrillation (HCC)   2. Primary hypertension   3. Hyponatremia    PLAN:    In order of problems listed above:  Stable asymptomatic rate is controlled with her beta-blocker regard utilize reduced dose anticoagulant for stroke prophylaxis stop aspirin Continue to trend home blood pressures I am not can add a second agent at this time Stable hyponatremia Recheck ejection fraction echocardiogram   Next appointment: 3 months   Medication Adjustments/Labs and Tests Ordered: Current medicines are reviewed at length with the patient today.  Concerns regarding medicines are outlined above.  No orders of the defined types were placed in this encounter.  No orders of the defined types were placed in this encounter.    History of Present Illness:    Zitong Popko is a 77 y.o. female with a hx of persistent atrial fibrillation purposely not anticoagulated because of gait dysfunction neuropathy and fall risk, hypertension hyponatremia and malignant neoplasm of the left breast last seen 02/14/2023.  Following the visit she had an echocardiogram reported 04/05/2023 left ventricle normal in size systolic function EF 50-50 5 to 60% normal right ventricular size function but moderate elevation of the pulmonary artery systolic pressure.  Compliance with diet, lifestyle and medications: Yes  Although she does not completely fulfill criteria for reduced dose anticoagulant she is close to 60 kg and weight and she is 77 years old I am concerned about her potential of stroke I think she will have significant survival with her breast cancer she would benefit from anticoagulant therapy and I am going to start her on reduced dose  anticoagulant Eliquis 2 and half milligrams twice daily She is asymptomatic from her atrial fibrillation her home blood pressures run 1 35-1 40 systolic on her beta-blocker She is not having edema shortness of breath chest pain palpitation or syncope Past Medical History:  Diagnosis Date   A-fib (HCC)    Acute respiratory failure with hypoxia (HCC) 05/09/2014   Adult hypothyroidism 05/03/2013   Arthritis    Blood dyscrasia    bleeds and bruises easily   Breast cancer (HCC)    left   Cervical spondylosis without myelopathy 05/06/2014   C3-4     Endometrial cancer (HCC) 01/21/1988   Fx lower humerus-closed 10/22/1997   Gait disorder 07/05/2016   Graves' disease 07/16/2021   HCAP (healthcare-associated pneumonia) 05/09/2014   Hepatitis     Hep A years ago   History of corticosteroid therapy 02/13/2023   History of endocrine disorder 02/13/2023   HNP (herniated nucleus pulposus), cervical 05/06/2014   Hypertension    Hyponatremia 12/02/2021   Hypothyroidism    Graves disease, age 67 yo   Malignant neoplasm of left breast (HCC) 06/05/2020   Nose trouble    right nostril will hemorrhage at times   Osteoporosis 11/04/2020   Other forms of scoliosis, thoracolumbar region 11/03/2016   Pneumonia    Rib pain on left side 09/26/2022   Rosacea    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

## 2023-06-16 ENCOUNTER — Encounter: Payer: Self-pay | Admitting: Cardiology

## 2023-06-16 ENCOUNTER — Ambulatory Visit: Payer: Medicare Other | Attending: Cardiology | Admitting: Cardiology

## 2023-06-16 VITALS — BP 154/68 | HR 60 | Ht 61.0 in | Wt 139.0 lb

## 2023-06-16 DIAGNOSIS — I10 Essential (primary) hypertension: Secondary | ICD-10-CM | POA: Diagnosis not present

## 2023-06-16 DIAGNOSIS — I4811 Longstanding persistent atrial fibrillation: Secondary | ICD-10-CM | POA: Diagnosis not present

## 2023-06-16 DIAGNOSIS — E871 Hypo-osmolality and hyponatremia: Secondary | ICD-10-CM | POA: Diagnosis not present

## 2023-06-16 MED ORDER — APIXABAN 2.5 MG PO TABS: 2.5000 mg | ORAL_TABLET | Freq: Two times a day (BID) | ORAL | 3 refills | Status: DC

## 2023-06-16 NOTE — Patient Instructions (Addendum)
Medication Instructions:  Your physician has recommended you make the following change in your medication:   STOP: Aspirin START: Eliquis 2.5 mg two times daily  *If you need a refill on your cardiac medications before your next appointment, please call your pharmacy*   Lab Work: None If you have labs (blood work) drawn today and your tests are completely normal, you will receive your results only by: MyChart Message (if you have MyChart) OR A paper copy in the mail If you have any lab test that is abnormal or we need to change your treatment, we will call you to review the results.   Testing/Procedures: Your physician has requested that you have an echocardiogram. Echocardiography is a painless test that uses sound waves to create images of your heart. It provides your doctor with information about the size and shape of your heart and how well your heart's chambers and valves are working. This procedure takes approximately one hour. There are no restrictions for this procedure. Please do NOT wear cologne, perfume, aftershave, or lotions (deodorant is allowed). Please arrive 15 minutes prior to your appointment time.    Follow-Up: At [facility], you and your health needs are our priority.  As part of our continuing mission to provide you with exceptional heart care, we have created designated Provider Care Teams.  These Care Teams include your primary Cardiologist (physician) and Advanced Practice Providers (APPs -  Physician Assistants and Nurse Practitioners) who all work together to provide you with the care you need, when you need it.  We recommend signing up for the patient portal called "MyChart".  Sign up information is provided on this After Visit Summary.  MyChart is used to connect with patients for Virtual Visits (Telemedicine).  Patients are able to view lab/test results, encounter notes, upcoming appointments, etc.  Non-urgent messages can be sent to your provider  as well.   To learn more about what you can do with MyChart, go to [email or web address].    Your next appointment:   3 month(s)  Provider:   Wallis Bamberg, NP  Other Instructions None

## 2023-06-17 ENCOUNTER — Other Ambulatory Visit: Payer: Self-pay

## 2023-06-20 ENCOUNTER — Encounter: Payer: Self-pay | Admitting: Physical Therapy

## 2023-06-20 ENCOUNTER — Encounter: Payer: Medicare Other | Admitting: Physical Therapy

## 2023-06-20 ENCOUNTER — Telehealth: Payer: Self-pay

## 2023-06-20 DIAGNOSIS — R252 Cramp and spasm: Secondary | ICD-10-CM

## 2023-06-20 DIAGNOSIS — R278 Other lack of coordination: Secondary | ICD-10-CM

## 2023-06-20 DIAGNOSIS — M6289 Other specified disorders of muscle: Secondary | ICD-10-CM | POA: Diagnosis not present

## 2023-06-20 DIAGNOSIS — N393 Stress incontinence (female) (male): Secondary | ICD-10-CM | POA: Diagnosis not present

## 2023-06-20 DIAGNOSIS — R102 Pelvic and perineal pain: Secondary | ICD-10-CM | POA: Diagnosis not present

## 2023-06-20 NOTE — Telephone Encounter (Signed)
Received a request for Eliquis 5 mg to be sent to [facility].  After reading the patient's message sent on her mychart, it looks like she has gotten approval from Dr. Dulce Sellar to take the 5 mg tablet instead of the 2.5 mg tablet of Eliquis and will be cutting the 5 in half to take the 2.5 mg due to the price of medication,  however when entering the orders for patient to take half of the 5 mg tablet by mouth 2 times daily it is giving me an error.  I will forward to Dr. Hulen Shouts nurse.

## 2023-06-20 NOTE — Therapy (Signed)
OUTPATIENT PHYSICAL THERAPY FEMALE PELVIC TREATMENT   Patient Name: Karen Allison MRN: [id number] DOB:Jul 07, 1946, 77 y.o., female Today's Date: 06/20/2023  END OF SESSION:  PT End of Session - 06/20/23 1406     Visit Number 5    Date for PT Re-Evaluation 07/04/23    Authorization Type medicare    Authorization - Visit Number 5    Authorization - Number of Visits 10    PT Start Time 1400    PT Stop Time 1445    PT Time Calculation (min) 45 min    Activity Tolerance Patient tolerated treatment well    Behavior During Therapy [facility] for tasks assessed/performed             Past Medical History:  Diagnosis Date   A-fib (HCC)    Acute respiratory failure with hypoxia (HCC) 05/09/2014   Adult hypothyroidism 05/03/2013   Arthritis    Blood dyscrasia    bleeds and bruises easily   Breast cancer (HCC)    left   Cervical spondylosis without myelopathy 05/06/2014   C3-4     Endometrial cancer (HCC) 01/21/1988   Fx lower humerus-closed 10/22/1997   Gait disorder 07/05/2016   Graves' disease 07/16/2021   HCAP (healthcare-associated pneumonia) 05/09/2014   Hepatitis     Hep A years ago   History of corticosteroid therapy 02/13/2023   History of endocrine disorder 02/13/2023   HNP (herniated nucleus pulposus), cervical 05/06/2014   Hypertension    Hyponatremia 12/02/2021   Hypothyroidism    Graves disease, age 18 yo   Malignant neoplasm of left breast (HCC) 06/05/2020   Nose trouble    right nostril will hemorrhage at times   Osteoporosis 11/04/2020   Other forms of scoliosis, thoracolumbar region 11/03/2016   Pneumonia    Rib pain on left side 09/26/2022   Rosacea    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   C3-4     Past Surgical History:  Procedure Laterality Date    ABDOMINAL HYSTERECTOMY     Age 23   ANTERIOR CERVICAL DECOMP/DISCECTOMY FUSION N/A 05/06/2014   Procedure: ANTERIOR CERVICAL DISCECTOMY FUSION C3-4 with transgraft, local bone graft, plate and screws;  Surgeon: Kerrin Champagne, MD;  Location: [facility] OR;  Service: Orthopedics;  Laterality: N/A;   APPENDECTOMY     BREAST LUMPECTOMY Left    COLONOSCOPY     EYE SURGERY Bilateral    cataracts   LUMBAR SPINE SURGERY  2011   Removal of hematoma   ORIF HUMERUS FRACTURE Left 2013   THORACIC SPINE SURGERY  2011   Bone cages   THYROIDECTOMY     TONSILLECTOMY     Patient Active Problem List   Diagnosis Date Noted   A-fib [facility])    Arthritis    Blood dyscrasia    Breast cancer (HCC)    Hepatitis    Nose trouble    Rosacea    History of corticosteroid therapy 02/13/2023   History of endocrine disorder 02/13/2023   Rib pain on left side 09/26/2022   Hyponatremia 12/02/2021    Class: Chronic   Graves' disease 07/16/2021   Osteoporosis 11/04/2020    Class: Chronic   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   Malignant neoplasm of left breast (HCC) 06/05/2020   Other forms  of scoliosis, thoracolumbar region 11/03/2016    Class: Chronic   Gait disorder 07/05/2016   HCAP (healthcare-associated pneumonia) 05/09/2014   Acute respiratory failure with hypoxia (HCC) 05/09/2014   Pneumonia 05/09/2014   Hypertension    Hypothyroidism    Spinal stenosis in cervical region 05/06/2014    Class: Chronic   Cervical spondylosis without myelopathy 05/06/2014   HNP (herniated nucleus pulposus), cervical 05/06/2014   Adult hypothyroidism 05/03/2013   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   Fx lower humerus-closed 10/22/1997   Endometrial cancer (HCC) 01/21/1988    PCP: Philemon Kingdom, MD  REFERRING PROVIDER: Selmer Dominion, NP   REFERRING DIAG:  R10.2 (ICD-10-CM) - Vaginal pain  M62.89 (ICD-10-CM) - Pelvic floor dysfunction in female  N39.3 (ICD-10-CM) - SUI (stress urinary  incontinence, female)    THERAPY DIAG:  Cramp and spasm  Other lack of coordination  Rationale for Evaluation and Treatment: Rehabilitation  ONSET DATE: 04/2022  SUBJECTIVE:                                                                                                                                                                                           SUBJECTIVE STATEMENT: I may of had less straining with urination. It is getting easier to not strain.  Fluid intake: Yes: water, coffee    PAIN:  Are you having pain? No  PRECAUTIONS: Other: breast cancer  RED FLAGS: None   WEIGHT BEARING RESTRICTIONS: No  FALLS:  Has patient fallen in last 6 months? Yes. Number of falls fell in May with some breaks. She has been to physical therapy to work on strength and balance  LIVING ENVIRONMENT: Lives with: lives with their spouse   OCCUPATION: retired  PLOF: Independent  PATIENT GOALS: void urine without straining  PERTINENT HISTORY:  Left breast cancer; Hypothyroidism;Graves disease; Abdominal Hysterectomy; Anterior cervical discectomy fusion;; Lumbar spine surgery; Endometrial cancer with radical hysterectomy.   BOWEL MOVEMENT: managing bowel movements with fiber   URINATION: Pain with urination: No Fully empty bladder: Yes: At night need to urinate multiple times in a row, and to push on her belly or vagina to empty bladder  Stream: Weak Urgency: No Frequency: Day time voids 6-8.  Nocturia: infrequent times per night to void.  Leakage: Coughing and Sneezing Pads: Yes: 2 pads per day  INTERCOURSE:Not active  PREGNANCY: Vaginal deliveries 1  PROLAPSE: None   OBJECTIVE:   DIAGNOSTIC FINDINGS:  Pelvic floor strength II/V  Pelvic floor musculature: Right levator tender, Right obturator tender, Left levator tender, Left obturator tender  PVR of 44 ml was obtained by bladder scan.  Positive CST on exam  COGNITION: Overall cognitive status: Within  functional limits for tasks assessed     SENSATION: Light touch: Appears intact Proprioception: Appears intact   GAIT: Assistive device utilized:  3 wheeled walker Level of assistance: Complete Independence   POSTURE: rounded shoulders, forward head, decreased lumbar lordosis, and increased thoracic kyphosis  PELVIC ALIGNMENT:   LOWER EXTREMITY ROM: full bilateral hip ROM   LOWER EXTREMITY MMT: 4/5 through out extremities   PALPATION:   General  Bulge lower abdomen when contract the abdominals                External Perineal Exam intact, slight redness around the introitus                             Internal Pelvic Floor tightness throughout the pelvic floor, decreased tissue mobility posterior vaginal canal, therapist only able to place her index finger to the PIP due to shorten vaginal canal.   Patient confirms identification and approves PT to assess internal pelvic floor and treatment Yes  PELVIC MMT:   MMT eval 05/16/23 05/30/23 06/13/23  Vaginal 2/5 with hug of therapist finger but not able to bulge 2/5 with weak hug  3/5 with weak lift holding 10 sec 3/5 with weak hug and lift  (Blank rows = not tested)        TONE: increased    TODAY'S TREATMENT:   06/20/23 Manual: Soft tissue mobilization: Manual work to the left pelvic floor while laying on right side and educated patient on how to massage the area herself.  Exercises: Stretches/mobility: Educated patient on how to massage her left pelvic floor in sitting to help relax with urination Strengthening:(contract the pelvic floor) Sitting marching with yellow band 30 x Sitting hip abduction with yellow band 30 x  Knee extension with yellow band 30 x each    06/13/23 Manual: Internal pelvic floor techniques: No emotional/communication barriers or cognitive limitation. Patient is motivated to learn. Patient understands and agrees with treatment goals and plan. PT explains patient will be examined in  standing, sitting, and lying down to see how their muscles and joints work. When they are ready, they will be asked to remove their underwear so PT can examine their perineum. The patient is also given the option of providing their own chaperone as one is not provided in our facility. The patient also has the right and is explained the right to defer or refuse any part of the evaluation or treatment including the internal exam. With the patient's consent, PT will use one gloved finger to gently assess the muscles of the pelvic floor, seeing how well it contracts and relaxes and if there is muscle symmetry. After, the patient will get dressed and PT and patient will discuss exam findings and plan of care. PT and patient discuss plan of care, schedule, attendance policy and HEP activities.  Going through the vaginal canal working on the posterior vaginal canal, posterior introitus, along the levator ani and obturator internist and apex of the vaginal canal Neuromuscular re-education: Pelvic floor contraction training: Pelvic floor contraction in supine with therapist finger in the vaginal canal giving tactile cues for contraction, tried to take finger out of the canal and having patient not let me pull my finger.  Having patient blow into her curled fingers to generate pressure to push urine out for urination. She was able to do this after 5 tries Exercises: Stretches/mobility: Diaphragmatic breathing in sitting and  can feel the pelvic floor relax 10 x Sitting pelvic tilt, pelvic sway and cat cow to do when urinating to relax the pelvic floor.  Strengthening: Sitting quick contraction 5 times but hard for patient      05/30/23 Manual: Soft tissue mobilization: Manual work along the left urogenital diaphragm and ischiocavernosus.  Internal pelvic floor techniques: No emotional/communication barriers or cognitive limitation. Patient is motivated to learn. Patient understands and agrees with treatment  goals and plan. PT explains patient will be examined in standing, sitting, and lying down to see how their muscles and joints work. When they are ready, they will be asked to remove their underwear so PT can examine their perineum. The patient is also given the option of providing their own chaperone as one is not provided in our facility. The patient also has the right and is explained the right to defer or refuse any part of the evaluation or treatment including the internal exam. With the patient's consent, PT will use one gloved finger to gently assess the muscles of the pelvic floor, seeing how well it contracts and relaxes and if there is muscle symmetry. After, the patient will get dressed and PT and patient will discuss exam findings and plan of care. PT and patient discuss plan of care, schedule, attendance policy and HEP activities.  Going through the vaginal canal working on the left levator ani and obturator internist, along the inferior lateral introitus then along the sides of the bladder Exercises: Strengthening: Sitting pelvic floor contraction holding for 10 sec 5 times then 5 quick flicks.  Therapist finger in the vaginal canal working on quick flicks and holding contraction for 10 sec       PATIENT EDUCATION:  06/13/23 Education details: Access Code: [email or web address], educated on vaginal moisturizers Person educated: Patient Education method: Explanation, Demonstration, Tactile cues, Verbal cues, and Handouts Education comprehension: verbalized understanding, returned demonstration, verbal cues required, tactile cues required, and needs further education  HOME EXERCISE PROGRAM: 06/13/23 Access Code: [email or web address] URL: [email or web address] Date: 06/20/2023 Prepared by: Eulis Foster  Exercises - Seated Diaphragmatic Breathing  - 1 x daily - 7 x weekly - 1 sets - 10 reps - Seated Pelvic Floor Contraction  - 3 x daily - 7 x weekly - 1 sets - 5 reps - 10 sec hold - Quick  Flick Pelvic Floor Contractions Seated    - 3 x daily - 7 x weekly - 1 sets - 5 reps - Seated Cat Cow  - 1 x daily - 7 x weekly - 1 sets - 10 reps - Seated March with Resistance  - 1 x daily - 3 x weekly - 3 sets - 10 reps - Seated Hip Abduction with Pelvic Floor Contraction and Resistance Loop  - 1 x daily - 3 x weekly - 3 sets - 10 reps - Seated Knee Extension with Resistance  - 1 x daily - 3 x weekly - 3 sets - 10 reps  ASSESSMENT:  CLINICAL IMPRESSION: Patient is a 77 y.o. female who was seen today for physical therapy  treatment for stress incontinence, vaginal pain and pelvic floor dysfunction. Straining to urinate is 50% better.  Pelvic pain is 80% better with more on the left. Pelvic floor strength is 3/5 with a weak hug. Patient is able to bulge her pelvic floor while urinating. Patient was able to relax her pelvic floor when simulating her pushing out urine when she blows into her fist.   Urinary leakage is 60% better.  Patient is not leaking at night now. Patient wears 2 pads per day that are dryer. Patient is able to urinate and not have to get up then sit back. She will tilt to the left side to finish emptying her bladder. Patient will benefit from skilled therapy to improve pelvic floor elongation and coordination.      OBJECTIVE IMPAIRMENTS: decreased activity tolerance, decreased coordination, decreased endurance, decreased strength, increased fascial restrictions, and increased muscle spasms.   ACTIVITY LIMITATIONS: continence and toileting  PARTICIPATION LIMITATIONS: community activity  PERSONAL FACTORS: Age, Fitness, and 3+ comorbidities: Left breast cancer; Hypothyroidism;Graves disease; Abdominal Hysterectomy; Anterior cervical discectomy fusion;; Lumbar spine surgery; Endometrial cancer with radical hysterectomy.  are also affecting patient's functional outcome.   REHAB POTENTIAL: Good  CLINICAL DECISION MAKING: Evolving/moderate complexity  EVALUATION COMPLEXITY:  Moderate   GOALS: Goals reviewed with patient? Yes  SHORT TERM GOALS: Target date: 10/14/2  Patient educated on pelvic and hip stretches to elongate the pelvic floor.  Baseline: Goal status: Met 06/13/23  2.  Patient is able to perform diaphragmatic breathing to bulge the pelvic floor.  Baseline:  Goal status: Met 06/13/23  3.  Patient educated in ways to relax the pelvic floor when urinating.  Baseline:  Goal status: Met 06/13/23   LONG TERM GOALS: Target date: 07/04/23  Patient educated in HEP Baseline:  Goal status: INITIAL  2.  Patient educated in vaginal trainers to elongate the vaginal canal and reduce vaginal tightness.  Baseline:  Goal status: defer 06/13/23  3.  Patient pelvic floor strength is >/= 3/5 with full contraction and full relaxation.  Baseline:  Goal status: INITIAL  4.  Patient is able to fully empty her bladder when urinating prior to going to bed due to the relaxation of the pelvic floor.  Baseline:  Goal status: ongoing 06/20/23   PLAN:  PT FREQUENCY: 1x/week  PT DURATION: 8 weeks  PLANNED INTERVENTIONS: Therapeutic exercises, Therapeutic activity, Neuromuscular re-education, Patient/Family education, Dry Needling, Electrical stimulation, Cryotherapy, Moist heat, Biofeedback, and Manual therapy  PLAN FOR NEXT SESSION: update HEP   Eulis Foster, PT 06/20/23 2:48 PM

## 2023-06-22 ENCOUNTER — Other Ambulatory Visit: Payer: Self-pay | Admitting: Pharmacist

## 2023-06-22 ENCOUNTER — Encounter: Payer: Self-pay | Admitting: Oncology

## 2023-06-22 ENCOUNTER — Telehealth: Payer: Self-pay | Admitting: Cardiology

## 2023-06-22 ENCOUNTER — Other Ambulatory Visit: Payer: Self-pay

## 2023-06-22 MED ORDER — APIXABAN 5 MG PO TABS: ORAL_TABLET | ORAL | Status: DC

## 2023-06-22 NOTE — Telephone Encounter (Signed)
Eliquis 5mg  1/2 tablet twice daily #90 ref x 3 sent to [facility]

## 2023-06-22 NOTE — Telephone Encounter (Signed)
Called Eliquis 5mg  1/2 tablet #90 ref x 3 to [facility]- Ok'd per Dr. Dulce Sellar.

## 2023-06-22 NOTE — Telephone Encounter (Signed)
Pt c/o medication issue:  1. Name of Medication:   apixaban (ELIQUIS) 2.5 MG TABS tablet   2. How are you currently taking this medication (dosage and times per day)?   3. Are you having a reaction (difficulty breathing--STAT)?   4. What is your medication issue?   Caller Carollee Herter) stated patient wanted to get this medication refilled at 5 mg dosage.

## 2023-06-26 ENCOUNTER — Inpatient Hospital Stay: Payer: Medicare Other | Attending: Oncology

## 2023-06-26 VITALS — BP 161/60 | HR 64 | Temp 97.7°F | Resp 18 | Ht 61.0 in | Wt 133.0 lb

## 2023-06-26 DIAGNOSIS — R339 Retention of urine, unspecified: Secondary | ICD-10-CM | POA: Diagnosis present

## 2023-06-26 DIAGNOSIS — E0500 Thyrotoxicosis with diffuse goiter without thyrotoxic crisis or storm: Secondary | ICD-10-CM | POA: Diagnosis present

## 2023-06-26 DIAGNOSIS — Z9889 Other specified postprocedural states: Secondary | ICD-10-CM | POA: Diagnosis not present

## 2023-06-26 DIAGNOSIS — S32409A Unspecified fracture of unspecified acetabulum, initial encounter for closed fracture: Secondary | ICD-10-CM | POA: Diagnosis present

## 2023-06-26 DIAGNOSIS — L719 Rosacea, unspecified: Secondary | ICD-10-CM | POA: Diagnosis present

## 2023-06-26 DIAGNOSIS — E890 Postprocedural hypothyroidism: Secondary | ICD-10-CM | POA: Diagnosis present

## 2023-06-26 DIAGNOSIS — S32431A Displaced fracture of anterior column [iliopubic] of right acetabulum, initial encounter for closed fracture: Secondary | ICD-10-CM | POA: Diagnosis not present

## 2023-06-26 DIAGNOSIS — S32451A Displaced transverse fracture of right acetabulum, initial encounter for closed fracture: Secondary | ICD-10-CM | POA: Diagnosis not present

## 2023-06-26 DIAGNOSIS — D6869 Other thrombophilia: Secondary | ICD-10-CM | POA: Diagnosis present

## 2023-06-26 DIAGNOSIS — S32591D Other specified fracture of right pubis, subsequent encounter for fracture with routine healing: Secondary | ICD-10-CM | POA: Diagnosis not present

## 2023-06-26 DIAGNOSIS — W19XXXA Unspecified fall, initial encounter: Secondary | ICD-10-CM | POA: Diagnosis not present

## 2023-06-26 DIAGNOSIS — I480 Paroxysmal atrial fibrillation: Secondary | ICD-10-CM | POA: Diagnosis not present

## 2023-06-26 DIAGNOSIS — C7800 Secondary malignant neoplasm of unspecified lung: Secondary | ICD-10-CM | POA: Diagnosis not present

## 2023-06-26 DIAGNOSIS — S32401A Unspecified fracture of right acetabulum, initial encounter for closed fracture: Secondary | ICD-10-CM | POA: Diagnosis not present

## 2023-06-26 DIAGNOSIS — G4700 Insomnia, unspecified: Secondary | ICD-10-CM | POA: Diagnosis present

## 2023-06-26 DIAGNOSIS — S32471A Displaced fracture of medial wall of right acetabulum, initial encounter for closed fracture: Secondary | ICD-10-CM | POA: Diagnosis not present

## 2023-06-26 DIAGNOSIS — K219 Gastro-esophageal reflux disease without esophagitis: Secondary | ICD-10-CM | POA: Diagnosis present

## 2023-06-26 DIAGNOSIS — E871 Hypo-osmolality and hyponatremia: Secondary | ICD-10-CM | POA: Diagnosis not present

## 2023-06-26 DIAGNOSIS — M25561 Pain in right knee: Secondary | ICD-10-CM | POA: Diagnosis not present

## 2023-06-26 DIAGNOSIS — Y92000 Kitchen of unspecified non-institutional (private) residence as  the place of occurrence of the external cause: Secondary | ICD-10-CM | POA: Diagnosis not present

## 2023-06-26 DIAGNOSIS — I5032 Chronic diastolic (congestive) heart failure: Secondary | ICD-10-CM | POA: Diagnosis present

## 2023-06-26 DIAGNOSIS — I4892 Unspecified atrial flutter: Secondary | ICD-10-CM | POA: Diagnosis not present

## 2023-06-26 DIAGNOSIS — W19XXXD Unspecified fall, subsequent encounter: Secondary | ICD-10-CM | POA: Diagnosis present

## 2023-06-26 DIAGNOSIS — I272 Pulmonary hypertension, unspecified: Secondary | ICD-10-CM | POA: Diagnosis not present

## 2023-06-26 DIAGNOSIS — M810 Age-related osteoporosis without current pathological fracture: Secondary | ICD-10-CM | POA: Diagnosis present

## 2023-06-26 DIAGNOSIS — I959 Hypotension, unspecified: Secondary | ICD-10-CM | POA: Diagnosis not present

## 2023-06-26 DIAGNOSIS — S32591A Other specified fracture of right pubis, initial encounter for closed fracture: Secondary | ICD-10-CM | POA: Diagnosis not present

## 2023-06-26 DIAGNOSIS — S32421A Displaced fracture of posterior wall of right acetabulum, initial encounter for closed fracture: Secondary | ICD-10-CM | POA: Diagnosis not present

## 2023-06-26 DIAGNOSIS — M25512 Pain in left shoulder: Secondary | ICD-10-CM | POA: Diagnosis not present

## 2023-06-26 DIAGNOSIS — E785 Hyperlipidemia, unspecified: Secondary | ICD-10-CM | POA: Diagnosis present

## 2023-06-26 DIAGNOSIS — G8929 Other chronic pain: Secondary | ICD-10-CM | POA: Diagnosis present

## 2023-06-26 DIAGNOSIS — I10 Essential (primary) hypertension: Secondary | ICD-10-CM | POA: Diagnosis not present

## 2023-06-26 DIAGNOSIS — S32401D Unspecified fracture of right acetabulum, subsequent encounter for fracture with routine healing: Secondary | ICD-10-CM | POA: Diagnosis not present

## 2023-06-26 DIAGNOSIS — D696 Thrombocytopenia, unspecified: Secondary | ICD-10-CM | POA: Diagnosis not present

## 2023-06-26 DIAGNOSIS — M25551 Pain in right hip: Secondary | ICD-10-CM | POA: Diagnosis not present

## 2023-06-26 DIAGNOSIS — M25572 Pain in left ankle and joints of left foot: Secondary | ICD-10-CM | POA: Diagnosis not present

## 2023-06-26 DIAGNOSIS — E876 Hypokalemia: Secondary | ICD-10-CM | POA: Diagnosis not present

## 2023-06-26 DIAGNOSIS — E039 Hypothyroidism, unspecified: Secondary | ICD-10-CM | POA: Diagnosis not present

## 2023-06-26 DIAGNOSIS — I110 Hypertensive heart disease with heart failure: Secondary | ICD-10-CM | POA: Diagnosis not present

## 2023-06-26 DIAGNOSIS — S72051A Unspecified fracture of head of right femur, initial encounter for closed fracture: Secondary | ICD-10-CM | POA: Diagnosis not present

## 2023-06-26 DIAGNOSIS — R54 Age-related physical debility: Secondary | ICD-10-CM | POA: Diagnosis present

## 2023-06-26 DIAGNOSIS — Z79890 Hormone replacement therapy (postmenopausal): Secondary | ICD-10-CM | POA: Diagnosis not present

## 2023-06-26 DIAGNOSIS — K5901 Slow transit constipation: Secondary | ICD-10-CM | POA: Diagnosis not present

## 2023-06-26 DIAGNOSIS — W010XXA Fall on same level from slipping, tripping and stumbling without subsequent striking against object, initial encounter: Secondary | ICD-10-CM | POA: Diagnosis present

## 2023-06-26 DIAGNOSIS — Y9301 Activity, walking, marching and hiking: Secondary | ICD-10-CM | POA: Diagnosis present

## 2023-06-26 DIAGNOSIS — M19012 Primary osteoarthritis, left shoulder: Secondary | ICD-10-CM | POA: Diagnosis present

## 2023-06-26 DIAGNOSIS — K5909 Other constipation: Secondary | ICD-10-CM | POA: Diagnosis present

## 2023-06-26 DIAGNOSIS — I4891 Unspecified atrial fibrillation: Secondary | ICD-10-CM | POA: Diagnosis not present

## 2023-06-26 DIAGNOSIS — S32441A Displaced fracture of posterior column [ilioischial] of right acetabulum, initial encounter for closed fracture: Secondary | ICD-10-CM | POA: Diagnosis not present

## 2023-06-26 DIAGNOSIS — K5900 Constipation, unspecified: Secondary | ICD-10-CM | POA: Diagnosis not present

## 2023-06-26 DIAGNOSIS — C787 Secondary malignant neoplasm of liver and intrahepatic bile duct: Secondary | ICD-10-CM | POA: Diagnosis not present

## 2023-06-26 DIAGNOSIS — M1711 Unilateral primary osteoarthritis, right knee: Secondary | ICD-10-CM | POA: Diagnosis not present

## 2023-06-26 DIAGNOSIS — C50912 Malignant neoplasm of unspecified site of left female breast: Secondary | ICD-10-CM | POA: Diagnosis present

## 2023-06-26 DIAGNOSIS — M62838 Other muscle spasm: Secondary | ICD-10-CM | POA: Diagnosis not present

## 2023-06-26 DIAGNOSIS — M8580 Other specified disorders of bone density and structure, unspecified site: Secondary | ICD-10-CM | POA: Diagnosis not present

## 2023-06-26 DIAGNOSIS — R600 Localized edema: Secondary | ICD-10-CM | POA: Diagnosis not present

## 2023-06-26 DIAGNOSIS — Z981 Arthrodesis status: Secondary | ICD-10-CM | POA: Diagnosis not present

## 2023-06-26 DIAGNOSIS — D62 Acute posthemorrhagic anemia: Secondary | ICD-10-CM | POA: Diagnosis not present

## 2023-06-26 MED ORDER — TRASTUZUMAB-ANNS CHEMO 150 MG IV SOLR
6.0000 mg/kg | Freq: Once | INTRAVENOUS | Status: AC
  Administered 2023-06-26: 378 mg via INTRAVENOUS
  Filled 2023-06-26: qty 18

## 2023-06-26 MED ORDER — SODIUM CHLORIDE 0.9 % IV SOLN: Freq: Once | INTRAVENOUS | Status: AC

## 2023-06-26 NOTE — Patient Instructions (Signed)
Trastuzumab Injection What is this medication? TRASTUZUMAB (tras TOO zoo mab) treats breast cancer and stomach cancer. It works by blocking a protein that causes cancer cells to grow and multiply. This helps to slow or stop the spread of cancer cells. This medicine may be used for other purposes; ask your health care provider or pharmacist if you have questions. COMMON BRAND NAME(S): Herceptin, Herzuma, KANJINTI, Ogivri, Ontruzant, Trazimera What should I tell my care team before I take this medication? They need to know if you have any of these conditions: Heart failure Lung disease An unusual or allergic reaction to trastuzumab, other medications, foods, dyes, or preservatives Pregnant or trying to get pregnant Breast-feeding How should I use this medication? This medication is injected into a vein. It is given by your care team in a hospital or clinic setting. Talk to your care team about the use of this medication in children. It is not approved for use in children. Overdosage: If you think you have taken too much of this medicine contact a poison control center or emergency room at once. NOTE: This medicine is only for you. Do not share this medicine with others. What if I miss a dose? Keep appointments for follow-up doses. It is important not to miss your dose. Call your care team if you are unable to keep an appointment. What may interact with this medication? Certain types of chemotherapy, such as daunorubicin, doxorubicin, epirubicin, idarubicin This list may not describe all possible interactions. Give your health care provider a list of all the medicines, herbs, non-prescription drugs, or dietary supplements you use. Also tell them if you smoke, drink alcohol, or use illegal drugs. Some items may interact with your medicine. What should I watch for while using this medication? Your condition will be monitored carefully while you are receiving this medication. This medication may  make you feel generally unwell. This is not uncommon, as chemotherapy affects healthy cells as well as cancer cells. Report any side effects. Continue your course of treatment even though you feel ill unless your care team tells you to stop. This medication may increase your risk of getting an infection. Call your care team for advice if you get a fever, chills, sore throat, or other symptoms of a cold or flu. Do not treat yourself. Try to avoid being around people who are sick. Avoid taking medications that contain aspirin, acetaminophen, ibuprofen, naproxen, or ketoprofen unless instructed by your care team. These medications can hide a fever. Talk to your care team if you may be pregnant. Serious birth defects can occur if you take this medication during pregnancy and for 7 months after the last dose. You will need a negative pregnancy test before starting this medication. Contraception is recommended while taking this medication and for 7 months after the last dose. Your care team can help you find the option that works for you. Do not breastfeed while taking this medication and for 7 months after stopping treatment. What side effects may I notice from receiving this medication? Side effects that you should report to your care team as soon as possible: Allergic reactions or angioedema--skin rash, itching or hives, swelling of the face, eyes, lips, tongue, arms, or legs, trouble swallowing or breathing Dry cough, shortness of breath or trouble breathing Heart failure--shortness of breath, swelling of the ankles, feet, or hands, sudden weight gain, unusual weakness or fatigue Infection--fever, chills, cough, or sore throat Infusion reactions--chest pain, shortness of breath or trouble breathing, feeling faint or   lightheaded Side effects that usually do not require medical attention (report to your care team if they continue or are bothersome): Diarrhea Dizziness Headache Nausea Trouble  sleeping Vomiting This list may not describe all possible side effects. Call your doctor for medical advice about side effects. You may report side effects to FDA at 1-800-FDA-1088. Where should I keep my medication? This medication is given in a hospital or clinic. It will not be stored at home. NOTE: This sheet is a summary. It may not cover all possible information. If you have questions about this medicine, talk to your doctor, pharmacist, or health care provider.  2024 Elsevier/Gold Standard (2021-12-21 00:00:00)  

## 2023-06-27 ENCOUNTER — Encounter: Payer: Self-pay | Admitting: Physical Therapy

## 2023-06-27 ENCOUNTER — Encounter: Payer: Medicare Other | Attending: Obstetrics and Gynecology | Admitting: Physical Therapy

## 2023-06-27 DIAGNOSIS — R278 Other lack of coordination: Secondary | ICD-10-CM | POA: Insufficient documentation

## 2023-06-27 DIAGNOSIS — R252 Cramp and spasm: Secondary | ICD-10-CM | POA: Insufficient documentation

## 2023-06-27 NOTE — Therapy (Signed)
OUTPATIENT PHYSICAL THERAPY FEMALE PELVIC TREATMENT   Patient Name: Karen Allison MRN: [id number] DOB:May 15, 1946, 77 y.o., female Today's Date: 06/27/2023  END OF SESSION:  PT End of Session - 06/27/23 1405     Visit Number 6    Date for PT Re-Evaluation 07/04/23    Authorization Type medicare    Authorization - Visit Number 6    Authorization - Number of Visits 10    PT Start Time 1400    PT Stop Time 1445    PT Time Calculation (min) 45 min    Activity Tolerance Patient tolerated treatment well    Behavior During Therapy [facility] for tasks assessed/performed             Past Medical History:  Diagnosis Date   A-fib (HCC)    Acute respiratory failure with hypoxia (HCC) 05/09/2014   Adult hypothyroidism 05/03/2013   Arthritis    Blood dyscrasia    bleeds and bruises easily   Breast cancer (HCC)    left   Cervical spondylosis without myelopathy 05/06/2014   C3-4     Endometrial cancer (HCC) 01/21/1988   Fx lower humerus-closed 10/22/1997   Gait disorder 07/05/2016   Graves' disease 07/16/2021   HCAP (healthcare-associated pneumonia) 05/09/2014   Hepatitis     Hep A years ago   History of corticosteroid therapy 02/13/2023   History of endocrine disorder 02/13/2023   HNP (herniated nucleus pulposus), cervical 05/06/2014   Hypertension    Hyponatremia 12/02/2021   Hypothyroidism    Graves disease, age 69 yo   Malignant neoplasm of left breast (HCC) 06/05/2020   Nose trouble    right nostril will hemorrhage at times   Osteoporosis 11/04/2020   Other forms of scoliosis, thoracolumbar region 11/03/2016   Pneumonia    Rib pain on left side 09/26/2022   Rosacea    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   C3-4     Past Surgical History:  Procedure Laterality Date   ABDOMINAL  HYSTERECTOMY     Age 59   ANTERIOR CERVICAL DECOMP/DISCECTOMY FUSION N/A 05/06/2014   Procedure: ANTERIOR CERVICAL DISCECTOMY FUSION C3-4 with transgraft, local bone graft, plate and screws;  Surgeon: Kerrin Champagne, MD;  Location: [facility] OR;  Service: Orthopedics;  Laterality: N/A;   APPENDECTOMY     BREAST LUMPECTOMY Left    COLONOSCOPY     EYE SURGERY Bilateral    cataracts   LUMBAR SPINE SURGERY  2011   Removal of hematoma   ORIF HUMERUS FRACTURE Left 2013   THORACIC SPINE SURGERY  2011   Bone cages   THYROIDECTOMY     TONSILLECTOMY     Patient Active Problem List   Diagnosis Date Noted   A-fib [facility])    Arthritis    Blood dyscrasia    Breast cancer (HCC)    Hepatitis    Nose trouble    Rosacea    History of corticosteroid therapy 02/13/2023   History of endocrine disorder 02/13/2023   Rib pain on left side 09/26/2022   Hyponatremia 12/02/2021    Class: Chronic   Graves' disease 07/16/2021   Osteoporosis 11/04/2020    Class: Chronic   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   Malignant neoplasm of left breast (HCC) 06/05/2020   Other forms  of scoliosis, thoracolumbar region 11/03/2016    Class: Chronic   Gait disorder 07/05/2016   HCAP (healthcare-associated pneumonia) 05/09/2014   Acute respiratory failure with hypoxia (HCC) 05/09/2014   Pneumonia 05/09/2014   Hypertension    Hypothyroidism    Spinal stenosis in cervical region 05/06/2014    Class: Chronic   Cervical spondylosis without myelopathy 05/06/2014   HNP (herniated nucleus pulposus), cervical 05/06/2014   Adult hypothyroidism 05/03/2013   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   Fx lower humerus-closed 10/22/1997   Endometrial cancer (HCC) 01/21/1988    PCP: Philemon Kingdom, MD  REFERRING PROVIDER: Selmer Dominion, NP   REFERRING DIAG:  R10.2 (ICD-10-CM) - Vaginal pain  M62.89 (ICD-10-CM) - Pelvic floor dysfunction in female  N39.3 (ICD-10-CM) - SUI (stress urinary  incontinence, female)    THERAPY DIAG:  Cramp and spasm  Other lack of coordination  Rationale for Evaluation and Treatment: Rehabilitation  ONSET DATE: 04/2022  SUBJECTIVE:                                                                                                                                                                                           SUBJECTIVE STATEMENT: I feel good since last visit. I do not want to use the dilators. I had some vaginal pain after the manual work to the left side of the pelvic wall. I have been massaging the pelvic floor myself.   PAIN:  Are you having pain? No  PRECAUTIONS: Other: breast cancer  RED FLAGS: None   WEIGHT BEARING RESTRICTIONS: No  FALLS:  Has patient fallen in last 6 months? Yes. Number of falls fell in May with some breaks. She has been to physical therapy to work on strength and balance  LIVING ENVIRONMENT: Lives with: lives with their spouse   OCCUPATION: retired  PLOF: Independent  PATIENT GOALS: void urine without straining  PERTINENT HISTORY:  Left breast cancer; Hypothyroidism;Graves disease; Abdominal Hysterectomy; Anterior cervical discectomy fusion;; Lumbar spine surgery; Endometrial cancer with radical hysterectomy.   BOWEL MOVEMENT: managing bowel movements with fiber   URINATION: Pain with urination: No Fully empty bladder: Yes: At night need to urinate multiple times in a row, and to push on her belly or vagina to empty bladder  Stream: Weak Urgency: No Frequency: Day time voids 6-8.  Nocturia: infrequent times per night to void.  Leakage: Coughing and Sneezing Pads: Yes: 2 pads per day  INTERCOURSE:Not active  PREGNANCY: Vaginal deliveries 1  PROLAPSE: None   OBJECTIVE:   DIAGNOSTIC FINDINGS:  Pelvic floor strength II/V  Pelvic floor musculature: Right levator tender, Right obturator tender, Left levator tender, Left obturator  tender  PVR of 44 ml was obtained by bladder scan.   Positive CST on exam    COGNITION: Overall cognitive status: Within functional limits for tasks assessed     SENSATION: Light touch: Appears intact Proprioception: Appears intact   GAIT: Assistive device utilized:  3 wheeled walker Level of assistance: Complete Independence   POSTURE: rounded shoulders, forward head, decreased lumbar lordosis, and increased thoracic kyphosis  PELVIC ALIGNMENT:   LOWER EXTREMITY ROM: full bilateral hip ROM   LOWER EXTREMITY MMT: 4/5 through out extremities 06/27/23 bilateral lower extremities are 4/5.    PALPATION:   General  Bulge lower abdomen when contract the abdominals                External Perineal Exam intact, slight redness around the introitus                             Internal Pelvic Floor tightness throughout the pelvic floor, decreased tissue mobility posterior vaginal canal, therapist only able to place her index finger to the PIP due to shorten vaginal canal.   Patient confirms identification and approves PT to assess internal pelvic floor and treatment Yes  PELVIC MMT:   MMT eval 05/16/23 05/30/23 06/13/23  Vaginal 2/5 with hug of therapist finger but not able to bulge 2/5 with weak hug  3/5 with weak lift holding 10 sec 3/5 with weak hug and lift  (Blank rows = not tested)        TONE: increased    TODAY'S TREATMENT:   06/27/23 Exercises: Stretches/mobility: Diaphragmatic breathing in sitting 10 x  Strengthening: Sitting pelvic floor contraction holding 10 sec 10 x and educated patient on leaning forward for better contraction Sitting marching with red band around the knees 30 x Sitting hip abduction with red band around the knees 30 x Sitting knee extension with red band 30 x each leg    06/20/23 Manual: Soft tissue mobilization: Manual work to the left pelvic floor while laying on right side and educated patient on how to massage the area herself.  Exercises: Stretches/mobility: Educated patient on  how to massage her left pelvic floor in sitting to help relax with urination Strengthening:(contract the pelvic floor) Sitting marching with yellow band 30 x Sitting hip abduction with yellow band 30 x  Knee extension with yellow band 30 x each    06/13/23 Manual: Internal pelvic floor techniques: No emotional/communication barriers or cognitive limitation. Patient is motivated to learn. Patient understands and agrees with treatment goals and plan. PT explains patient will be examined in standing, sitting, and lying down to see how their muscles and joints work. When they are ready, they will be asked to remove their underwear so PT can examine their perineum. The patient is also given the option of providing their own chaperone as one is not provided in our facility. The patient also has the right and is explained the right to defer or refuse any part of the evaluation or treatment including the internal exam. With the patient's consent, PT will use one gloved finger to gently assess the muscles of the pelvic floor, seeing how well it contracts and relaxes and if there is muscle symmetry. After, the patient will get dressed and PT and patient will discuss exam findings and plan of care. PT and patient discuss plan of care, schedule, attendance policy and HEP activities.  Going through the vaginal canal working on the posterior vaginal  canal, posterior introitus, along the levator ani and obturator internist and apex of the vaginal canal Neuromuscular re-education: Pelvic floor contraction training: Pelvic floor contraction in supine with therapist finger in the vaginal canal giving tactile cues for contraction, tried to take finger out of the canal and having patient not let me pull my finger.  Having patient blow into her curled fingers to generate pressure to push urine out for urination. She was able to do this after 5 tries Exercises: Stretches/mobility: Diaphragmatic breathing in sitting and  can feel the pelvic floor relax 10 x Sitting pelvic tilt, pelvic sway and cat cow to do when urinating to relax the pelvic floor.  Strengthening: Sitting quick contraction 5 times but hard for patient        PATIENT EDUCATION:  06/27/23 Education details: Access Code: [email or web address], educated on vaginal moisturizers Person educated: Patient Education method: Explanation, Demonstration, Tactile cues, Verbal cues, and Handouts Education comprehension: verbalized understanding, returned demonstration, verbal cues required, tactile cues required, and needs further education  HOME EXERCISE PROGRAM: 06/27/23 Access Code: [email or web address] URL: [email or web address] Date: 06/20/2023 Prepared by: Eulis Foster  Exercises - Seated Diaphragmatic Breathing  - 1 x daily - 7 x weekly - 1 sets - 10 reps - Seated Pelvic Floor Contraction  - 3 x daily - 7 x weekly - 1 sets - 5 reps - 10 sec hold - Quick Flick Pelvic Floor Contractions Seated    - 3 x daily - 7 x weekly - 1 sets - 5 reps - Seated Cat Cow  - 1 x daily - 7 x weekly - 1 sets - 10 reps - Seated March with Resistance  - 1 x daily - 3 x weekly - 3 sets - 10 reps - Seated Hip Abduction with Pelvic Floor Contraction and Resistance Loop  - 1 x daily - 3 x weekly - 3 sets - 10 reps - Seated Knee Extension with Resistance  - 1 x daily - 3 x weekly - 3 sets - 10 reps  ASSESSMENT:  CLINICAL IMPRESSION: Patient is a 77 y.o. female who was seen today for physical therapy  treatment for stress incontinence, vaginal pain and pelvic floor dysfunction. Straining to urinate is 50% better.  Pelvic pain is 80% better with more on the left. Patient is not urinating multiple times in a row. She is able to fully empty her bladder.  Pelvic floor strength is 3/5 with a weak hug. Patient is able to bulge her pelvic floor while urinating. Patient was able to relax her pelvic floor when simulating her pushing out urine when she blows into her fist.   Urinary leakage  is 70% better. Patient is not leaking at night now. Patient wears 2 pads per day that are dryer. Patient is able to urinate and not have to get up then sit back. She will tilt to the left side to finish emptying her bladder. Patient will leak urine if she coughs a lot.   OBJECTIVE IMPAIRMENTS: decreased activity tolerance, decreased coordination, decreased endurance, decreased strength, increased fascial restrictions, and increased muscle spasms.   ACTIVITY LIMITATIONS: continence and toileting  PARTICIPATION LIMITATIONS: community activity  PERSONAL FACTORS: Age, Fitness, and 3+ comorbidities: Left breast cancer; Hypothyroidism;Graves disease; Abdominal Hysterectomy; Anterior cervical discectomy fusion;; Lumbar spine surgery; Endometrial cancer with radical hysterectomy.  are also affecting patient's functional outcome.   REHAB POTENTIAL: Good  CLINICAL DECISION MAKING: Evolving/moderate complexity  EVALUATION COMPLEXITY: Moderate   GOALS: Goals reviewed with patient? Yes  SHORT TERM  GOALS: Target date: 10/14/2  Patient educated on pelvic and hip stretches to elongate the pelvic floor.  Baseline: Goal status: Met 06/13/23  2.  Patient is able to perform diaphragmatic breathing to bulge the pelvic floor.  Baseline:  Goal status: Met 06/13/23  3.  Patient educated in ways to relax the pelvic floor when urinating.  Baseline:  Goal status: Met 06/13/23   LONG TERM GOALS: Target date: 07/04/23  Patient educated in HEP Baseline:  Goal status: Met 06/27/23  2.  Patient educated in vaginal trainers to elongate the vaginal canal and reduce vaginal tightness.  Baseline:  Goal status: defer 06/13/23  3.  Patient pelvic floor strength is >/= 3/5 with full contraction and full relaxation.  Baseline:  Goal status: Met 06/27/23  4.  Patient is able to fully empty her bladder when urinating prior to going to bed due to the relaxation of the pelvic floor.  Baseline:  Goal status: Met  06/27/23   PLAN: Discharge to HEP this visit.    Eulis Foster, PT 06/27/23 2:06 PM   PHYSICAL THERAPY DISCHARGE SUMMARY  Visits from Start of Care: 6  Current functional level related to goals / functional outcomes: See below   Remaining deficits: See below   Education / Equipment: HEP   Patient agrees to discharge. Patient goals were partially met. Patient is being discharged due to being pleased with the current functional level. Thank you for the referral.   Eulis Foster, PT 06/27/23 2:17 PM

## 2023-06-29 ENCOUNTER — Encounter (HOSPITAL_COMMUNITY): Payer: Self-pay

## 2023-06-29 ENCOUNTER — Emergency Department (HOSPITAL_COMMUNITY): Payer: Medicare Other

## 2023-06-29 ENCOUNTER — Other Ambulatory Visit: Payer: Self-pay

## 2023-06-29 ENCOUNTER — Inpatient Hospital Stay (HOSPITAL_COMMUNITY)
Admission: EM | Admit: 2023-06-29 | Discharge: 2023-07-06 | DRG: 958 | Disposition: A | Discharge status: Rehabilitation Facility | Payer: Medicare Other | Attending: Internal Medicine | Admitting: Internal Medicine

## 2023-06-29 DIAGNOSIS — S72051A Unspecified fracture of head of right femur, initial encounter for closed fracture: Secondary | ICD-10-CM | POA: Diagnosis present

## 2023-06-29 DIAGNOSIS — I480 Paroxysmal atrial fibrillation: Secondary | ICD-10-CM | POA: Diagnosis present

## 2023-06-29 DIAGNOSIS — M62838 Other muscle spasm: Secondary | ICD-10-CM | POA: Diagnosis not present

## 2023-06-29 DIAGNOSIS — E876 Hypokalemia: Secondary | ICD-10-CM | POA: Diagnosis not present

## 2023-06-29 DIAGNOSIS — E871 Hypo-osmolality and hyponatremia: Secondary | ICD-10-CM | POA: Diagnosis present

## 2023-06-29 DIAGNOSIS — D696 Thrombocytopenia, unspecified: Secondary | ICD-10-CM | POA: Diagnosis present

## 2023-06-29 DIAGNOSIS — S32471A Displaced fracture of medial wall of right acetabulum, initial encounter for closed fracture: Secondary | ICD-10-CM | POA: Diagnosis present

## 2023-06-29 DIAGNOSIS — I4892 Unspecified atrial flutter: Secondary | ICD-10-CM | POA: Diagnosis present

## 2023-06-29 DIAGNOSIS — K5900 Constipation, unspecified: Secondary | ICD-10-CM | POA: Diagnosis not present

## 2023-06-29 DIAGNOSIS — C50912 Malignant neoplasm of unspecified site of left female breast: Secondary | ICD-10-CM | POA: Diagnosis present

## 2023-06-29 DIAGNOSIS — I272 Pulmonary hypertension, unspecified: Secondary | ICD-10-CM | POA: Diagnosis present

## 2023-06-29 DIAGNOSIS — Z9089 Acquired absence of other organs: Secondary | ICD-10-CM

## 2023-06-29 DIAGNOSIS — Z853 Personal history of malignant neoplasm of breast: Secondary | ICD-10-CM

## 2023-06-29 DIAGNOSIS — K5909 Other constipation: Secondary | ICD-10-CM | POA: Diagnosis present

## 2023-06-29 DIAGNOSIS — S32591A Other specified fracture of right pubis, initial encounter for closed fracture: Secondary | ICD-10-CM | POA: Diagnosis present

## 2023-06-29 DIAGNOSIS — Y92000 Kitchen of unspecified non-institutional (private) residence as  the place of occurrence of the external cause: Secondary | ICD-10-CM | POA: Diagnosis not present

## 2023-06-29 DIAGNOSIS — M810 Age-related osteoporosis without current pathological fracture: Secondary | ICD-10-CM | POA: Diagnosis present

## 2023-06-29 DIAGNOSIS — W010XXA Fall on same level from slipping, tripping and stumbling without subsequent striking against object, initial encounter: Secondary | ICD-10-CM | POA: Diagnosis present

## 2023-06-29 DIAGNOSIS — G8929 Other chronic pain: Secondary | ICD-10-CM | POA: Diagnosis not present

## 2023-06-29 DIAGNOSIS — I110 Hypertensive heart disease with heart failure: Secondary | ICD-10-CM | POA: Diagnosis present

## 2023-06-29 DIAGNOSIS — M1711 Unilateral primary osteoarthritis, right knee: Secondary | ICD-10-CM | POA: Diagnosis not present

## 2023-06-29 DIAGNOSIS — Z8701 Personal history of pneumonia (recurrent): Secondary | ICD-10-CM

## 2023-06-29 DIAGNOSIS — C7800 Secondary malignant neoplasm of unspecified lung: Secondary | ICD-10-CM | POA: Diagnosis present

## 2023-06-29 DIAGNOSIS — M25512 Pain in left shoulder: Secondary | ICD-10-CM | POA: Diagnosis not present

## 2023-06-29 DIAGNOSIS — D6869 Other thrombophilia: Secondary | ICD-10-CM | POA: Diagnosis present

## 2023-06-29 DIAGNOSIS — S32401D Unspecified fracture of right acetabulum, subsequent encounter for fracture with routine healing: Secondary | ICD-10-CM | POA: Diagnosis present

## 2023-06-29 DIAGNOSIS — R600 Localized edema: Secondary | ICD-10-CM | POA: Diagnosis not present

## 2023-06-29 DIAGNOSIS — S32431A Displaced fracture of anterior column [iliopubic] of right acetabulum, initial encounter for closed fracture: Principal | ICD-10-CM | POA: Diagnosis present

## 2023-06-29 DIAGNOSIS — S32441A Displaced fracture of posterior column [ilioischial] of right acetabulum, initial encounter for closed fracture: Secondary | ICD-10-CM | POA: Diagnosis present

## 2023-06-29 DIAGNOSIS — Z981 Arthrodesis status: Secondary | ICD-10-CM | POA: Diagnosis not present

## 2023-06-29 DIAGNOSIS — S32591D Other specified fracture of right pubis, subsequent encounter for fracture with routine healing: Secondary | ICD-10-CM | POA: Diagnosis not present

## 2023-06-29 DIAGNOSIS — C787 Secondary malignant neoplasm of liver and intrahepatic bile duct: Secondary | ICD-10-CM | POA: Diagnosis present

## 2023-06-29 DIAGNOSIS — Z791 Long term (current) use of non-steroidal anti-inflammatories (NSAID): Secondary | ICD-10-CM

## 2023-06-29 DIAGNOSIS — D759 Disease of blood and blood-forming organs, unspecified: Secondary | ICD-10-CM | POA: Diagnosis present

## 2023-06-29 DIAGNOSIS — G4700 Insomnia, unspecified: Secondary | ICD-10-CM | POA: Diagnosis present

## 2023-06-29 DIAGNOSIS — S32409A Unspecified fracture of unspecified acetabulum, initial encounter for closed fracture: Secondary | ICD-10-CM | POA: Diagnosis present

## 2023-06-29 DIAGNOSIS — E0500 Thyrotoxicosis with diffuse goiter without thyrotoxic crisis or storm: Secondary | ICD-10-CM | POA: Diagnosis present

## 2023-06-29 DIAGNOSIS — E039 Hypothyroidism, unspecified: Secondary | ICD-10-CM | POA: Diagnosis present

## 2023-06-29 DIAGNOSIS — K219 Gastro-esophageal reflux disease without esophagitis: Secondary | ICD-10-CM | POA: Diagnosis not present

## 2023-06-29 DIAGNOSIS — Z8542 Personal history of malignant neoplasm of other parts of uterus: Secondary | ICD-10-CM

## 2023-06-29 DIAGNOSIS — L719 Rosacea, unspecified: Secondary | ICD-10-CM | POA: Diagnosis present

## 2023-06-29 DIAGNOSIS — Z79890 Hormone replacement therapy (postmenopausal): Secondary | ICD-10-CM

## 2023-06-29 DIAGNOSIS — Z8619 Personal history of other infectious and parasitic diseases: Secondary | ICD-10-CM

## 2023-06-29 DIAGNOSIS — R54 Age-related physical debility: Secondary | ICD-10-CM | POA: Diagnosis present

## 2023-06-29 DIAGNOSIS — M19012 Primary osteoarthritis, left shoulder: Secondary | ICD-10-CM | POA: Diagnosis present

## 2023-06-29 DIAGNOSIS — Z90710 Acquired absence of both cervix and uterus: Secondary | ICD-10-CM

## 2023-06-29 DIAGNOSIS — Z9889 Other specified postprocedural states: Secondary | ICD-10-CM

## 2023-06-29 DIAGNOSIS — D62 Acute posthemorrhagic anemia: Secondary | ICD-10-CM | POA: Diagnosis not present

## 2023-06-29 DIAGNOSIS — I4891 Unspecified atrial fibrillation: Secondary | ICD-10-CM | POA: Diagnosis present

## 2023-06-29 DIAGNOSIS — I5032 Chronic diastolic (congestive) heart failure: Secondary | ICD-10-CM | POA: Diagnosis present

## 2023-06-29 DIAGNOSIS — E890 Postprocedural hypothyroidism: Secondary | ICD-10-CM | POA: Diagnosis present

## 2023-06-29 DIAGNOSIS — S32451A Displaced transverse fracture of right acetabulum, initial encounter for closed fracture: Secondary | ICD-10-CM | POA: Diagnosis present

## 2023-06-29 DIAGNOSIS — M247 Protrusio acetabuli: Secondary | ICD-10-CM | POA: Diagnosis present

## 2023-06-29 DIAGNOSIS — M25561 Pain in right knee: Secondary | ICD-10-CM | POA: Diagnosis present

## 2023-06-29 DIAGNOSIS — I10 Essential (primary) hypertension: Secondary | ICD-10-CM | POA: Diagnosis present

## 2023-06-29 DIAGNOSIS — R339 Retention of urine, unspecified: Secondary | ICD-10-CM | POA: Diagnosis present

## 2023-06-29 DIAGNOSIS — S32401A Unspecified fracture of right acetabulum, initial encounter for closed fracture: Secondary | ICD-10-CM | POA: Diagnosis not present

## 2023-06-29 DIAGNOSIS — M4185 Other forms of scoliosis, thoracolumbar region: Secondary | ICD-10-CM | POA: Diagnosis present

## 2023-06-29 DIAGNOSIS — E785 Hyperlipidemia, unspecified: Secondary | ICD-10-CM | POA: Diagnosis present

## 2023-06-29 DIAGNOSIS — Z9841 Cataract extraction status, right eye: Secondary | ICD-10-CM

## 2023-06-29 DIAGNOSIS — Z9842 Cataract extraction status, left eye: Secondary | ICD-10-CM

## 2023-06-29 DIAGNOSIS — M25551 Pain in right hip: Secondary | ICD-10-CM | POA: Diagnosis not present

## 2023-06-29 DIAGNOSIS — Z881 Allergy status to other antibiotic agents status: Secondary | ICD-10-CM

## 2023-06-29 DIAGNOSIS — D72829 Elevated white blood cell count, unspecified: Secondary | ICD-10-CM | POA: Diagnosis present

## 2023-06-29 DIAGNOSIS — Z87891 Personal history of nicotine dependence: Secondary | ICD-10-CM

## 2023-06-29 DIAGNOSIS — S32421A Displaced fracture of posterior wall of right acetabulum, initial encounter for closed fracture: Secondary | ICD-10-CM | POA: Diagnosis not present

## 2023-06-29 DIAGNOSIS — Z79899 Other long term (current) drug therapy: Secondary | ICD-10-CM

## 2023-06-29 DIAGNOSIS — W19XXXD Unspecified fall, subsequent encounter: Secondary | ICD-10-CM | POA: Diagnosis present

## 2023-06-29 DIAGNOSIS — I959 Hypotension, unspecified: Secondary | ICD-10-CM | POA: Diagnosis not present

## 2023-06-29 DIAGNOSIS — Z808 Family history of malignant neoplasm of other organs or systems: Secondary | ICD-10-CM

## 2023-06-29 DIAGNOSIS — W19XXXA Unspecified fall, initial encounter: Secondary | ICD-10-CM | POA: Diagnosis not present

## 2023-06-29 DIAGNOSIS — Y9301 Activity, walking, marching and hiking: Secondary | ICD-10-CM | POA: Diagnosis present

## 2023-06-29 DIAGNOSIS — M8580 Other specified disorders of bone density and structure, unspecified site: Secondary | ICD-10-CM | POA: Diagnosis not present

## 2023-06-29 DIAGNOSIS — R21 Rash and other nonspecific skin eruption: Secondary | ICD-10-CM | POA: Diagnosis not present

## 2023-06-29 DIAGNOSIS — Z8249 Family history of ischemic heart disease and other diseases of the circulatory system: Secondary | ICD-10-CM

## 2023-06-29 DIAGNOSIS — Z9049 Acquired absence of other specified parts of digestive tract: Secondary | ICD-10-CM

## 2023-06-29 DIAGNOSIS — S300XXA Contusion of lower back and pelvis, initial encounter: Secondary | ICD-10-CM | POA: Diagnosis present

## 2023-06-29 DIAGNOSIS — Z7901 Long term (current) use of anticoagulants: Secondary | ICD-10-CM

## 2023-06-29 DIAGNOSIS — K5901 Slow transit constipation: Secondary | ICD-10-CM | POA: Diagnosis not present

## 2023-06-29 DIAGNOSIS — M25572 Pain in left ankle and joints of left foot: Secondary | ICD-10-CM | POA: Diagnosis not present

## 2023-06-29 HISTORY — DX: Unspecified fracture of unspecified acetabulum, initial encounter for closed fracture: S32.409A

## 2023-06-29 LAB — CBC WITH DIFFERENTIAL/PLATELET
Abs Immature Granulocytes: 0.04 10*3/uL (ref 0.00–0.07)
Basophils Absolute: 0 10*3/uL (ref 0.0–0.1)
Basophils Relative: 0 %
Eosinophils Absolute: 0 10*3/uL (ref 0.0–0.5)
Eosinophils Relative: 0 %
HCT: 39 % (ref 36.0–46.0)
Hemoglobin: 12.6 g/dL (ref 12.0–15.0)
Immature Granulocytes: 0 %
Lymphocytes Relative: 7 %
Lymphs Abs: 0.9 10*3/uL (ref 0.7–4.0)
MCH: 31.6 pg (ref 26.0–34.0)
MCHC: 32.3 g/dL (ref 30.0–36.0)
MCV: 97.7 fL (ref 80.0–100.0)
Monocytes Absolute: 0.8 10*3/uL (ref 0.1–1.0)
Monocytes Relative: 6 %
Neutro Abs: 11.4 10*3/uL — ABNORMAL HIGH (ref 1.7–7.7)
Neutrophils Relative %: 87 %
Platelets: 143 10*3/uL — ABNORMAL LOW (ref 150–400)
RBC: 3.99 MIL/uL (ref 3.87–5.11)
RDW: 12.7 % (ref 11.5–15.5)
WBC: 13.1 10*3/uL — ABNORMAL HIGH (ref 4.0–10.5)
nRBC: 0 % (ref 0.0–0.2)

## 2023-06-29 LAB — BASIC METABOLIC PANEL
Anion gap: 12 (ref 5–15)
BUN: 15 mg/dL (ref 8–23)
CO2: 28 mmol/L (ref 22–32)
Calcium: 8.9 mg/dL (ref 8.9–10.3)
Chloride: 95 mmol/L — ABNORMAL LOW (ref 98–111)
Creatinine, Ser: 0.67 mg/dL (ref 0.44–1.00)
GFR, Estimated: >60 mL/min (ref >60)
Glucose, Bld: 140 mg/dL — ABNORMAL HIGH (ref 70–99)
Potassium: 3.6 mmol/L (ref 3.5–5.1)
Sodium: 135 mmol/L (ref 135–145)

## 2023-06-29 MED ORDER — FUROSEMIDE 40 MG PO TABS: 20.0000 mg | ORAL_TABLET | Freq: Every day | ORAL | Status: DC | PRN

## 2023-06-29 MED ORDER — HYDROCODONE-ACETAMINOPHEN 5-325 MG PO TABS
1.0000 | ORAL_TABLET | Freq: Four times a day (QID) | ORAL | Status: DC | PRN
  Administered 2023-06-30 – 2023-07-04 (×11): 1 via ORAL
  Filled 2023-06-29 (×13): qty 1

## 2023-06-29 MED ORDER — HYDROMORPHONE HCL 1 MG/ML IJ SOLN
0.5000 mg | Freq: Once | INTRAMUSCULAR | Status: AC
  Administered 2023-06-29: 0.5 mg via INTRAVENOUS
  Filled 2023-06-29: qty 1

## 2023-06-29 MED ORDER — PANTOPRAZOLE SODIUM 40 MG PO TBEC
40.0000 mg | DELAYED_RELEASE_TABLET | Freq: Every day | ORAL | Status: DC
  Administered 2023-06-30 – 2023-07-06 (×6): 40 mg via ORAL
  Filled 2023-06-29 (×6): qty 1

## 2023-06-29 MED ORDER — POLYETHYLENE GLYCOL 3350 17 G PO PACK
17.0000 g | PACK | Freq: Every day | ORAL | Status: DC | PRN
  Administered 2023-07-02 – 2023-07-04 (×2): 17 g via ORAL
  Filled 2023-06-29 (×3): qty 1

## 2023-06-29 MED ORDER — HYDROMORPHONE HCL 1 MG/ML IJ SOLN
0.5000 mg | INTRAMUSCULAR | Status: DC | PRN
  Administered 2023-06-29 – 2023-07-02 (×9): 0.5 mg via INTRAVENOUS
  Filled 2023-06-29 (×5): qty 0.5
  Filled 2023-06-29 (×2): qty 1
  Filled 2023-06-29 (×2): qty 0.5

## 2023-06-29 MED ORDER — GABAPENTIN 300 MG PO CAPS
600.0000 mg | ORAL_CAPSULE | Freq: Two times a day (BID) | ORAL | Status: DC
  Administered 2023-06-29 – 2023-07-03 (×8): 600 mg via ORAL
  Filled 2023-06-29 (×8): qty 2

## 2023-06-29 MED ORDER — LEVOTHYROXINE SODIUM 50 MCG PO TABS
50.0000 ug | ORAL_TABLET | Freq: Every day | ORAL | Status: DC
  Administered 2023-06-30 – 2023-07-06 (×7): 50 ug via ORAL
  Filled 2023-06-29 (×7): qty 1

## 2023-06-29 MED ORDER — METOPROLOL SUCCINATE ER 25 MG PO TB24: 25.0000 mg | ORAL_TABLET | Freq: Every day | ORAL | Status: DC

## 2023-06-29 MED ORDER — ACETAMINOPHEN 500 MG PO TABS
1000.0000 mg | ORAL_TABLET | Freq: Once | ORAL | Status: AC
  Administered 2023-06-29: 1000 mg via ORAL
  Filled 2023-06-29: qty 2

## 2023-06-29 MED ORDER — ROSUVASTATIN CALCIUM 5 MG PO TABS
10.0000 mg | ORAL_TABLET | ORAL | Status: DC
  Administered 2023-07-02: 10 mg via ORAL
  Filled 2023-06-29 (×2): qty 1

## 2023-06-29 MED ORDER — ACETAMINOPHEN 325 MG PO TABS
650.0000 mg | ORAL_TABLET | Freq: Three times a day (TID) | ORAL | Status: DC | PRN
  Administered 2023-07-05: 650 mg via ORAL
  Filled 2023-06-29: qty 2

## 2023-06-29 NOTE — ED Notes (Signed)
Pt ox dropped into the mid 80s without sustained rebound.  Placed pt on 2L [place] - pt is now at 99%

## 2023-06-29 NOTE — ED Triage Notes (Addendum)
Pt BIBA for fall and right hip pain.the patient took a 10/325 Norco pain med prior to EMS arrival,  no other meds given in route.    EMS States pt tripped and fell,denies LOC or hitting head and preseents with no obvious deformities to Right leg/hip/knee  Patient endorses right side pain,use of blood thinners and currently undergoing treatment for breast cancer that metastasized.  Pt states she has only taken her thyroid meds today.   Pt denies, nausea, dizziness or vision changes.  Pt is A&O x4

## 2023-06-29 NOTE — ED Notes (Signed)
Pt reports increasing right calf pain, no redness or tenderness noted.  EDP notified

## 2023-06-29 NOTE — ED Provider Notes (Signed)
[address] EMERGENCY DEPARTMENT AT [facility] Provider Note  CSN: [id number] Arrival date & time: 06/29/23 1026  Chief Complaint(s) Fall  HPI Karen Allison is a 77 y.o. female history of A-fib on Eliquis, hypertension, stage IV breast cancer presenting to the emergency department with fall.  The patient reports she fell this morning in the kitchen.  She got her legs tangled up and then fell landing on her right hip.  She denies any head strike or head injury.  Denies any other pain such as neck or back pain, arm pain, left leg pain.  She has not been able to ambulate.     Past Medical History Past Medical History:  Diagnosis Date   A-fib [facility])    Acute respiratory failure with hypoxia (HCC) 05/09/2014   Adult hypothyroidism 05/03/2013   Arthritis    Blood dyscrasia    bleeds and bruises easily   Breast cancer (HCC)    left   Cervical spondylosis without myelopathy 05/06/2014   C3-4     Endometrial cancer (HCC) 01/21/1988   Fx lower humerus-closed 10/22/1997   Gait disorder 07/05/2016   Graves' disease 07/16/2021   HCAP (healthcare-associated pneumonia) 05/09/2014   Hepatitis     Hep A years ago   History of corticosteroid therapy 02/13/2023   History of endocrine disorder 02/13/2023   HNP (herniated nucleus pulposus), cervical 05/06/2014   Hypertension    Hyponatremia 12/02/2021   Hypothyroidism    Graves disease, age 67 yo   Malignant neoplasm of left breast (HCC) 06/05/2020   Nose trouble    right nostril will hemorrhage at times   Osteoporosis 11/04/2020   Other forms of scoliosis, thoracolumbar region 11/03/2016   Pneumonia    Rib pain on left side 09/26/2022   Rosacea    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   C3-4     Patient Active Problem List    Diagnosis Date Noted   A-fib [facility])    Arthritis    Blood dyscrasia    Breast cancer (HCC)    Hepatitis    Nose trouble    Rosacea    History of corticosteroid therapy 02/13/2023   History of endocrine disorder 02/13/2023   Rib pain on left side 09/26/2022   Hyponatremia 12/02/2021    Class: Chronic   Graves' disease 07/16/2021   Osteoporosis 11/04/2020    Class: Chronic   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   Malignant neoplasm of left breast (HCC) 06/05/2020   Other forms of scoliosis, thoracolumbar region 11/03/2016    Class: Chronic   Gait disorder 07/05/2016   HCAP (healthcare-associated pneumonia) 05/09/2014   Acute respiratory failure with hypoxia (HCC) 05/09/2014   Pneumonia 05/09/2014   Hypertension    Hypothyroidism    Spinal stenosis in cervical region 05/06/2014    Class: Chronic   Cervical spondylosis without myelopathy 05/06/2014   HNP (herniated nucleus pulposus), cervical 05/06/2014   Adult hypothyroidism 05/03/2013   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 Authorizing Provider  apixaban (ELIQUIS) 5 MG TABS tablet Take 1/2 tablet twice daily 06/22/23   Baldo Daub, MD  B Complex  Vitamins (B COMPLEX PO) Take 1 tablet by mouth daily.    [provider]  Calcium Carbonate-Vitamin D (CALTRATE 600+D PO) Take 1 tablet by mouth 2 (two) times daily.    [provider]  celecoxib (CELEBREX) 200 MG capsule Take 200 mg by mouth daily.  12/31/10   [provider]  cholecalciferol (VITAMIN D3) 25 MCG (1000 UNIT) tablet Take 1,000 Units by mouth daily.    [provider]  diazepam (VALIUM) 10 MG tablet Take 10 mg by mouth at bedtime.     [provider]  docusate sodium (COLACE) 100 MG capsule Take 200 mg by mouth 2 (two) times daily  as needed for mild constipation.     [provider]  doxycycline (VIBRAMYCIN) 50 MG capsule Take 50 mg by mouth daily.    [provider]  estradiol (ESTRACE) 0.1 MG/GM vaginal cream Place 0.5 g vaginally 2 (two) times a week. Place 0.5g nightly for two weeks then twice a week after 03/09/23   Selmer Dominion, NP  fluconazole (DIFLUCAN) 150 MG tablet Take 150 mg by mouth once. 06/06/22   [provider]  furosemide (LASIX) 20 MG tablet Take 20-40 mg by mouth daily as needed for edema or fluid. 07/26/22   [provider]  gabapentin (NEURONTIN) 600 MG tablet Take 600-1,200 mg by mouth 4 (four) times daily - after meals and at bedtime. 1 tablet (600 mg) 3 times a day and 2 tablets(1200mg ) at bedtime    [provider]  HYDROcodone-acetaminophen (NORCO) 10-325 MG tablet Take 1 tablet by mouth every 6 (six) hours as needed for severe pain (pain score 7-10) or moderate pain (pain score 4-6).    [provider]  levothyroxine (SYNTHROID, LEVOTHROID) 50 MCG tablet Take 50 mcg by mouth daily.    [provider]  lidocaine-prilocaine (EMLA) cream Apply 1 Application topically as needed. 03/07/23   Selmer Dominion, NP  methocarbamol (ROBAXIN) 500 MG tablet Take 500 mg by mouth 4 (four) times daily.    [provider]  metoprolol succinate (TOPROL-XL) 25 MG 24 hr tablet Take 1 tablet (25 mg total) by mouth daily. 02/14/23   Baldo Daub, MD  metroNIDAZOLE (METROCREAM) 0.75 % cream Apply 1 Application topically 2 (two) times daily.    [provider]  Multiple Vitamin (MULTIVITAMIN WITH MINERALS) TABS Take 1 tablet by mouth daily.    [provider]  omeprazole (PRILOSEC) 20 MG capsule Take 1 capsule (20 mg total) by mouth 2 (two) times daily before a meal. 05/28/18   Kerrin Champagne, MD  ondansetron (ZOFRAN) 8 MG tablet Take 8 mg by mouth 2 (two) times daily as needed for nausea or vomiting. 04/08/22   [provider]  rosuvastatin (CRESTOR) 10 MG tablet Take 10 mg by mouth once a week. 07/27/22   [provider]  sennosides-docusate sodium (SENOKOT-S) 8.6-50 MG tablet Take 1 tablet by mouth daily.    [provider]  trastuzumab (HERCEPTIN) 440 MG injection 440 mg by Intravenous (Continuous Infusion) route every 21 ( twenty-one) days. infusion every 3 weeks 03/19/09   [provider]  Past Surgical History Past Surgical History:  Procedure Laterality Date   ABDOMINAL HYSTERECTOMY     Age 89   ANTERIOR CERVICAL DECOMP/DISCECTOMY FUSION N/A 05/06/2014   Procedure: ANTERIOR CERVICAL DISCECTOMY FUSION C3-4 with transgraft, local bone graft, plate and screws;  Surgeon: Kerrin Champagne, MD;  Location: [facility] OR;  Service: Orthopedics;  Laterality: N/A;   APPENDECTOMY     BREAST LUMPECTOMY Left    COLONOSCOPY     EYE SURGERY Bilateral    cataracts   LUMBAR SPINE SURGERY  2011   Removal of hematoma   ORIF HUMERUS FRACTURE Left 2013   THORACIC SPINE SURGERY  2011   Bone cages   THYROIDECTOMY     TONSILLECTOMY     Family History Family History  Problem Relation Age of Onset   Congestive Heart Failure Mother    Heart disease Father    Rheum arthritis Daughter    Tongue cancer Grandchild    Breast cancer Neg Hx     Social History Social History   Tobacco Use   Smoking status: Former    Current packs/day: 0.00    Types: Cigarettes    Quit date: 04/23/1962    Years since quitting: 61.2   Smokeless tobacco: Never   Tobacco comments:    as teenager  Vaping Use   Vaping status: Never Used  Substance Use Topics   Alcohol use: Not Currently    Comment: Social   Drug use: No   Allergies Cefuroxime and Zithromax [azithromycin]  Review of Systems Review of Systems  All other systems reviewed and are negative.   Physical Exam Vital Signs  I  have reviewed the triage vital signs BP (!) 140/73 (BP Location: Left Arm)   Pulse 82   Temp 98 F (36.7 C) (Oral)   Resp 17   Ht 5\' 1"  (1.549 m)   Wt 60 kg   SpO2 92%   BMI 24.99 kg/m  Physical Exam Vitals and nursing note reviewed.  Constitutional:      General: She is not in acute distress.    Appearance: She is well-developed.  HENT:     Head: Normocephalic and atraumatic.     Mouth/Throat:     Mouth: Mucous membranes are moist.  Eyes:     Pupils: Pupils are equal, round, and reactive to light.  Cardiovascular:     Rate and Rhythm: Normal rate and regular rhythm.     Heart sounds: No murmur heard. Pulmonary:     Effort: Pulmonary effort is normal. No respiratory distress.     Breath sounds: Normal breath sounds.  Abdominal:     General: Abdomen is flat.     Palpations: Abdomen is soft.     Tenderness: There is no abdominal tenderness.  Musculoskeletal:        General: No tenderness.     Right lower leg: No edema.     Left lower leg: No edema.     Comments: No midline C, T, L-spine tenderness.  No chest wall tenderness or crepitus.  Full active range of motion of the bilateral upper extremities and the left lower extremities with no focal tenderness or deformity.  No tenderness or limitation to range of motion of the right knee, ankle, painful range of motion of the right hip with tenderness over the right hip.  Skin:    General: Skin is warm and dry.  Neurological:     General: No focal deficit present.     Mental Status:  She is alert. Mental status is at baseline.  Psychiatric:        Mood and Affect: Mood normal.        Behavior: Behavior normal.     ED Results and Treatments Labs (all labs ordered are listed, but only abnormal results are displayed) Labs Reviewed  BASIC METABOLIC PANEL - Abnormal; Notable for the following components:      Result Value   Chloride 95 (*)    Glucose, Bld 140 (*)    All other components within normal limits  CBC WITH  DIFFERENTIAL/PLATELET - Abnormal; Notable for the following components:   WBC 13.1 (*)    Platelets 143 (*)    Neutro Abs 11.4 (*)    All other components within normal limits                                                                                                                          Radiology CT PELVIS WO CONTRAST  Result Date: 06/29/2023 CLINICAL DATA:  Hip fracture EXAM: CT PELVIS WITHOUT CONTRAST TECHNIQUE: Multidetector CT imaging of the pelvis was performed following the standard protocol without intravenous contrast. RADIATION DOSE REDUCTION: This exam was performed according to the departmental dose-optimization program which includes automated exposure control, adjustment of the mA and/or kV according to patient size and/or use of iterative reconstruction technique. COMPARISON:  Standard CT scan 03/08/2023.  X-ray 06/29/2023 earlier. FINDINGS: Urinary Tract:  Preserved contours of the urinary bladder. Bowel: Visualized small and large bowel in the pelvis has a normal course and caliber. Scattered colonic stool. Vascular/Lymphatic: Diffuse vascular calcifications along the aorta and branch vessels. No abnormal lymph node enlargement identified in the visualized pelvis. Reproductive: Uterus is absent. No separate adnexal mass. Numerous surgical clips in the pelvis. Other: There is extraperitoneal right-sided pelvic wall stranding and thickening with some small hematoma. Musculoskeletal: There is a comminuted fracture involving the right acetabulum with impaction of the femoral head of the hip joint. Fracture lines involve the supra-acetabular region, medial, posterior walls of the acetabula in particular. There is also some protrusio of the femoral head along the fracture line defects medially. Separate fracture of the inferior pubic ramus on the right. The anterior column of the acetabulum is also involve. Degenerative changes of the sacroiliac joints with sclerosis. No additional  fracture or dislocation. Degenerative changes of the hip joints. Some hypertrophic changes in the area of the pubic symphysis. Streak artifact from the hardware along the lower lumbar spine at the edge of the imaging field. IMPRESSION: Comminuted fracture involving the acetabulum diffusely with displaced fragments and a component protrusio from the femoral head. Separate fracture of the right inferior pubic ramus. Some extraperitoneal hematoma along the right pelvic sidewall. Degenerative changes.  Osteopenia. Electronically Signed   By: Karen Kays M.D.   On: 06/29/2023 15:15   DG Hip Unilat With Pelvis 2-3 Views Right  Result Date: 06/29/2023 CLINICAL DATA:  Fall.  Right hip pain EXAM: DG HIP (  WITH OR WITHOUT PELVIS) 3V RIGHT COMPARISON:  X-ray 10/11/2018 FINDINGS: Progressive joint space loss axially of the hip joints with some osteophytes. Hyperostosis. There is some deformity of the right acetabulum and some lucency with sclerosis involving the right supra-acetabular region which was not seen previously. Is also deformity along the right acetabulum with some protrusio. A focal bony abnormality is possible recommend further workup with CT when clinically appropriate fixation hardware seen along the lower lumbar spine. Numerous surgical clips in the pelvis. IMPRESSION: Heterogeneous appearance of the right acetabulum and supra-acetabular region, possible impaction fracture with some protrusio. Further workup with CT when clinically appropriate. Progressive degenerative changes. Electronically Signed   By: Karen Kays M.D.   On: 06/29/2023 13:45    Pertinent labs & imaging results that were available during my care of the patient were reviewed by me and considered in my medical decision making (see MDM for details).  Medications Ordered in ED Medications  HYDROmorphone (DILAUDID) injection 0.5 mg (0.5 mg Intravenous Given 06/29/23 1222)  HYDROmorphone (DILAUDID) injection 0.5 mg (0.5 mg Intravenous  Given 06/29/23 1414)  acetaminophen (TYLENOL) tablet 1,000 mg (1,000 mg Oral Given 06/29/23 1415)                                                                                                                                     Procedures Procedures  (including critical care time)  Medical Decision Making / ED Course   MDM:  77 year old female presenting to the emergency department with fall onto right hip.  Patient overall well-appearing, physical exam with no clear abnormality other than painful range of motion of the right hip and tenderness of the right hip.  Will obtain x-ray to evaluate for hip or pelvic fracture.  Patient adamantly denies any head injury.  She reports this is a mechanical fall and not a syncopal event.  Will reassess.  Will give additional medication for pain control.  Clinical Course as of 06/29/23 1658  Thu Jun 29, 2023  1628 X-ray/CT scan shows impacted acetabular fracture.  Discussed with Dr. Hulda Humphrey with orthopedic surgery.  He believes this will need to be fixed by orthopedic trauma specialist.  He will discuss this with them further.  Recommends the patient be admitted to [facility].  Will discuss with the hospitalist. [WS]  1655 Signed out to Dr. Marlyce Huge   [WS]    Clinical Course User Index [WS] Suezanne Jacquet Jerilee Field, MD     Additional history obtained: -Additional history obtained from ems -External records from outside source obtained and reviewed including: Chart review including previous notes, labs, imaging, consultation notes including prior notes    Lab Tests: -I ordered, reviewed, and interpreted labs.   The pertinent results include:   Labs Reviewed  BASIC METABOLIC PANEL - Abnormal; Notable for the following components:      Result Value   Chloride 95 (*)    Glucose, Bld  140 (*)    All other components within normal limits  CBC WITH DIFFERENTIAL/PLATELET - Abnormal; Notable for the following components:   WBC 13.1 (*)     Platelets 143 (*)    Neutro Abs 11.4 (*)    All other components within normal limits    Notable for leukocytosis, likely reactive    Imaging Studies ordered: I ordered imaging studies including CT pelvis. On my interpretation imaging demonstrates acetabular fracture I independently visualized and interpreted imaging. I agree with the radiologist interpretation   Medicines ordered and prescription drug management: Meds ordered this encounter  Medications   HYDROmorphone (DILAUDID) injection 0.5 mg   HYDROmorphone (DILAUDID) injection 0.5 mg   acetaminophen (TYLENOL) tablet 1,000 mg    -I have reviewed the patients home medicines and have made adjustments as needed   Consultations Obtained: I requested consultation with the orthopedic surgery Dr. Hulda Humphrey,  and discussed lab and imaging findings as well as pertinent plan - they recommend: Ad[facility] to [facility]     Reevaluation: After the interventions noted above, I reevaluated the patient and found that their symptoms have improved  Co morbidities that complicate the patient evaluation  Past Medical History:  Diagnosis Date   A-fib (HCC)    Acute respiratory failure with hypoxia (HCC) 05/09/2014   Adult hypothyroidism 05/03/2013   Arthritis    Blood dyscrasia    bleeds and bruises easily   Breast cancer (HCC)    left   Cervical spondylosis without myelopathy 05/06/2014   C3-4     Endometrial cancer (HCC) 01/21/1988   Fx lower humerus-closed 10/22/1997   Gait disorder 07/05/2016   Graves' disease 07/16/2021   HCAP (healthcare-associated pneumonia) 05/09/2014   Hepatitis     Hep A years ago   History of corticosteroid therapy 02/13/2023   History of endocrine disorder 02/13/2023   HNP (herniated nucleus pulposus), cervical 05/06/2014   Hypertension    Hyponatremia 12/02/2021   Hypothyroidism    Graves disease, age 35 yo   Malignant neoplasm of left breast (HCC) 06/05/2020   Nose trouble    right nostril  will hemorrhage at times   Osteoporosis 11/04/2020   Other forms of scoliosis, thoracolumbar region 11/03/2016   Pneumonia    Rib pain on left side 09/26/2022   Rosacea    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    Final Clinical Impression(s) / ED Diagnoses Final diagnoses:  Closed nondisplaced fracture of right acetabulum, unspecified portion of acetabulum, initial encounter [facility])     This chart was dictated using voice recognition software.  Despite best efforts to proofread,  errors can occur which can change the documentation meaning.    Lonell Grandchild, MD 06/29/23 [phone]

## 2023-06-29 NOTE — ED Notes (Signed)
MD gave verbal consent for periwick placement.  Periwick is in place, pt has requested a foley due to issues with periwick previously.  Will monitor to ensure pt can urinate effectively

## 2023-06-29 NOTE — H&P (Signed)
History and Physical    Patient: Karen Allison YQM:[id number] DOB: 06-01-1946 DOA: 06/29/2023 DOS: the patient was seen and examined on 06/29/2023 PCP: Philemon Kingdom, MD  Patient coming from: Home  Chief Complaint:  Chief Complaint  Patient presents with   Fall   HPI: Karen Allison is a 77 y.o. female with medical history significant of atrial fibrillation on Eliquis, hypertension, stage IV triple positive breast cancer, hypothyroidism presenting after mechanical fall.  She states that she was in her usual state of health until this morning.  She made herself some coffee and wore her slippers and as she was walking she tripped on herself and fell towards her right side.  She began experiencing a lot of pain and was unable to get up and moved to the couch.  Her family came to assist her but she remained in pain, which prompted her family to bring her into the hospital.  She denies any lightheadedness, chest pain, palpitations, loss of consciousness prior to the fall.  She denies any head trauma.  Patient does have a history of atrial fibrillation and is currently taking Eliquis 2.5 mg twice daily.  Her last dose was on the night of 11/6.  In the ED her CBC was notable for mild leukocytosis and mild thrombocytopenia.  BMP was unremarkable.  CT pelvis revealed a comminuted fracture involving the acetabulum along with a separate fracture of the right inferior pubic ramus.  The EDP consulted orthopedics, Dr. Jena Gauss, who will see the patient for operative repair.  She will need to go to [facility] for this procedure.  [facility] was asked to admit.   Review of Systems: As mentioned in the history of present illness. All other systems reviewed and are negative. Past Medical History:  Diagnosis Date   A-fib [facility])    Acute respiratory failure with hypoxia (HCC) 05/09/2014   Adult hypothyroidism 05/03/2013   Arthritis    Blood dyscrasia    bleeds and bruises easily   Breast cancer  (HCC)    left   Cervical spondylosis without myelopathy 05/06/2014   C3-4     Endometrial cancer (HCC) 01/21/1988   Fx lower humerus-closed 10/22/1997   Gait disorder 07/05/2016   Graves' disease 07/16/2021   HCAP (healthcare-associated pneumonia) 05/09/2014   Hepatitis     Hep A years ago   History of corticosteroid therapy 02/13/2023   History of endocrine disorder 02/13/2023   HNP (herniated nucleus pulposus), cervical 05/06/2014   Hypertension    Hyponatremia 12/02/2021   Hypothyroidism    Graves disease, age 69 yo   Malignant neoplasm of left breast (HCC) 06/05/2020   Nose trouble    right nostril will hemorrhage at times   Osteoporosis 11/04/2020   Other forms of scoliosis, thoracolumbar region 11/03/2016   Pneumonia    Rib pain on left side 09/26/2022   Rosacea    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   C3-4     Past Surgical History:  Procedure Laterality Date   ABDOMINAL HYSTERECTOMY     Age 58   ANTERIOR CERVICAL DECOMP/DISCECTOMY FUSION N/A 05/06/2014   Procedure: ANTERIOR CERVICAL DISCECTOMY FUSION C3-4 with transgraft, local bone graft, plate and screws;  Surgeon: Kerrin Champagne, MD;  Location: [facility] OR;  Service: Orthopedics;  Laterality: N/A;  APPENDECTOMY     BREAST LUMPECTOMY Left    COLONOSCOPY     EYE SURGERY Bilateral    cataracts   LUMBAR SPINE SURGERY  2011   Removal of hematoma   ORIF HUMERUS FRACTURE Left 2013   THORACIC SPINE SURGERY  2011   Bone cages   THYROIDECTOMY     TONSILLECTOMY     Social History:  reports that she quit smoking about 61 years ago. Her smoking use included cigarettes. She has never used smokeless tobacco. She reports that she does not currently use alcohol. She reports that she does not use drugs.  Allergies  Allergen Reactions    Cefuroxime Hives   Zithromax [Azithromycin] Hives    Family History  Problem Relation Age of Onset   Congestive Heart Failure Mother    Heart disease Father    Rheum arthritis Daughter    Tongue cancer Grandchild    Breast cancer Neg Hx     Prior to Admission medications   Medication Sig Start Date End Date Taking? Authorizing Provider  apixaban (ELIQUIS) 5 MG TABS tablet Take 1/2 tablet twice daily 06/22/23   Baldo Daub, MD  B Complex Vitamins (B COMPLEX PO) Take 1 tablet by mouth daily.    [provider]  Calcium Carbonate-Vitamin D (CALTRATE 600+D PO) Take 1 tablet by mouth 2 (two) times daily.    [provider]  celecoxib (CELEBREX) 200 MG capsule Take 200 mg by mouth daily.  12/31/10   [provider]  cholecalciferol (VITAMIN D3) 25 MCG (1000 UNIT) tablet Take 1,000 Units by mouth daily.    [provider]  diazepam (VALIUM) 10 MG tablet Take 10 mg by mouth at bedtime.     [provider]  docusate sodium (COLACE) 100 MG capsule Take 200 mg by mouth 2 (two) times daily as needed for mild constipation.     [provider]  doxycycline (VIBRAMYCIN) 50 MG capsule Take 50 mg by mouth daily.    [provider]  estradiol (ESTRACE) 0.1 MG/GM vaginal cream Place 0.5 g vaginally 2 (two) times a week. Place 0.5g nightly for two weeks then twice a week after 03/09/23   Selmer Dominion, NP  fluconazole (DIFLUCAN) 150 MG tablet Take 150 mg by mouth once. 06/06/22   [provider]  furosemide (LASIX) 20 MG tablet Take 20-40 mg by mouth daily as needed for edema or fluid. 07/26/22   [provider]  gabapentin (NEURONTIN) 600 MG tablet Take 600-1,200 mg by mouth 4 (four) times daily - after meals and at bedtime. 1 tablet (600 mg) 3 times a day and 2 tablets(1200mg ) at bedtime    [provider]  HYDROcodone-acetaminophen (NORCO) 10-325 MG tablet Take 1 tablet by mouth every 6 (six) hours as needed for  severe pain (pain score 7-10) or moderate pain (pain score 4-6).    [provider]  levothyroxine (SYNTHROID, LEVOTHROID) 50 MCG tablet Take 50 mcg by mouth daily.    [provider]  lidocaine-prilocaine (EMLA) cream Apply 1 Application topically as needed. 03/07/23   Selmer Dominion, NP  methocarbamol (ROBAXIN) 500 MG tablet Take 500 mg by mouth 4 (four) times daily.    [provider]  metoprolol succinate (TOPROL-XL) 25 MG 24 hr tablet Take 1 tablet (25 mg total) by mouth daily. 02/14/23   Baldo Daub, MD  metroNIDAZOLE (METROCREAM) 0.75 % cream Apply 1 Application topically 2 (two) times daily.    [provider]  Multiple Vitamin (MULTIVITAMIN WITH MINERALS) TABS Take 1 tablet by mouth daily.    [provider]  omeprazole (PRILOSEC) 20 MG capsule Take 1 capsule (20 mg total) by mouth 2 (two) times daily before a meal. 05/28/18   Kerrin Champagne, MD  ondansetron (ZOFRAN) 8 MG tablet Take 8 mg by mouth 2 (two) times daily as needed for nausea or vomiting. 04/08/22   [provider]  rosuvastatin (CRESTOR) 10 MG tablet Take 10 mg by mouth once a week. 07/27/22   [provider]  sennosides-docusate sodium (SENOKOT-S) 8.6-50 MG tablet Take 1 tablet by mouth daily.    [provider]  trastuzumab (HERCEPTIN) 440 MG injection 440 mg by Intravenous (Continuous Infusion) route every 21 ( twenty-one) days. infusion every 3 weeks 03/19/09   [provider]    Physical Exam: Vitals:   06/29/23 1045 06/29/23 1055 06/29/23 1401 06/29/23 1814  BP: (!) 161/81  (!) 140/73 (!) 145/68  Pulse: 77  82 85  Resp: 19  17 16   Temp: 98.2 F (36.8 C)  98 F (36.7 C) 98 F (36.7 C)  TempSrc: Oral  Oral Oral  SpO2: 91%  92% 98%  Weight:  60 kg    Height:  5\' 1"  (1.549 m)     General: Pleasant elderly female, laying in bed, NAD. HENT: Normocephalic, atraumatic.  Pink conjunctivae, moist mucous membranes. CV: Normal rate and  regular rhythm, no murmurs rubs or gallops. Pulm: Clear to auscultation bilaterally. Abdomen: Soft, nondistended, nontender to palpation. MSK: No peripheral edema noted.  Full range of motion in the left lower extremity.  Limited range of motion in right hip due to pain.  Tender to palpation over right hip.  Range of motion at right knee is full. Skin: Warm and dry. Neurological: AAOx3, no focal deficits. Psych: Calm and cooperative.  Data Reviewed:  CBC    Component Value Date/Time   WBC 13.1 (H) 06/29/2023 1435   RBC 3.99 06/29/2023 1435   HGB 12.6 06/29/2023 1435   HGB 11.9 (L) 01/24/2023 1523   HGB 13.5 03/10/2006 1205   HCT 39.0 06/29/2023 1435   HCT 39.9 03/10/2006 1205   PLT 143 (L) 06/29/2023 1435   PLT 234 01/24/2023 1523   PLT 166 03/10/2006 1205   MCV 97.7 06/29/2023 1435   MCV 92.8 03/10/2006 1205   MCH 31.6 06/29/2023 1435   MCHC 32.3 06/29/2023 1435   RDW 12.7 06/29/2023 1435   RDW 12.9 03/10/2006 1205   LYMPHSABS 0.9 06/29/2023 1435   LYMPHSABS 2.1 03/10/2006 1205   MONOABS 0.8 06/29/2023 1435   MONOABS 0.6 03/10/2006 1205   EOSABS 0.0 06/29/2023 1435   EOSABS 0.2 03/10/2006 1205   BASOSABS 0.0 06/29/2023 1435   BASOSABS 0.0 03/10/2006 1205      Latest Ref Rng & Units 06/29/2023    2:35 PM 03/08/2023   12:00 AM 01/24/2023    3:23 PM  BMP  Glucose 70 - 99 mg/dL 161   096   BUN 8 - 23 mg/dL 15  10     13    Creatinine 0.44 - 1.00 mg/dL 0.45  0.7     4.09   Sodium 135 - 145 mmol/L 135   129   Potassium 3.5 - 5.1 mmol/L 3.6   4.0   Chloride 98 - 111 mmol/L 95   95   CO2 22 - 32 mmol/L 28   26   Calcium 8.9 - 10.3 mg/dL 8.9  8.6      This result is from an external source.   CT PELVIS WO CONTRAST  Result Date: 06/29/2023 CLINICAL DATA:  Hip fracture EXAM: CT PELVIS WITHOUT CONTRAST TECHNIQUE: Multidetector CT imaging of the pelvis was performed following the standard protocol without intravenous contrast. RADIATION DOSE REDUCTION: This exam was performed  according to the departmental dose-optimization program which includes automated exposure control, adjustment of the mA and/or kV according to patient size and/or use of iterative reconstruction technique. COMPARISON:  Standard CT scan 03/08/2023.  X-ray 06/29/2023 earlier. FINDINGS: Urinary Tract:  Preserved contours of the urinary bladder. Bowel: Visualized small and large bowel in the pelvis has a normal course and caliber. Scattered colonic stool. Vascular/Lymphatic: Diffuse vascular calcifications along the aorta and branch vessels. No abnormal lymph node enlargement identified in the visualized pelvis. Reproductive: Uterus is absent. No separate adnexal mass. Numerous surgical clips in the pelvis. Other: There is extraperitoneal right-sided pelvic wall stranding and thickening with some small hematoma. Musculoskeletal: There is a comminuted fracture involving the right acetabulum with impaction of the femoral head of the hip joint. Fracture lines involve the supra-acetabular region, medial, posterior walls of the acetabula in particular. There is also some protrusio of the femoral head along the fracture line defects medially. Separate fracture of the inferior pubic ramus on the right. The anterior column of the acetabulum is also involve. Degenerative changes of the sacroiliac joints with sclerosis. No additional fracture or dislocation. Degenerative changes of the hip joints. Some hypertrophic changes in the area of the pubic symphysis. Streak artifact from the hardware along the lower lumbar spine at the edge of the imaging field. IMPRESSION: Comminuted fracture involving the acetabulum diffusely with displaced fragments and a component protrusio from the femoral head. Separate fracture of the right inferior pubic ramus. Some extraperitoneal hematoma along the right pelvic sidewall. Degenerative changes.  Osteopenia. Electronically Signed   By: Karen Kays M.D.   On: 06/29/2023 15:15   DG Hip Unilat With  Pelvis 2-3 Views Right  Result Date: 06/29/2023 CLINICAL DATA:  Fall.  Right hip pain EXAM: DG HIP (WITH OR WITHOUT PELVIS) 3V RIGHT COMPARISON:  X-ray 10/11/2018 FINDINGS: Progressive joint space loss axially of the hip joints with some osteophytes. Hyperostosis. There is some deformity of the right acetabulum and some lucency with sclerosis involving the right supra-acetabular region which was not seen previously. Is also deformity along the right acetabulum with some protrusio. A focal bony abnormality is possible recommend further workup with CT when clinically appropriate fixation hardware seen along the lower lumbar spine. Numerous surgical clips in the pelvis. IMPRESSION: Heterogeneous appearance of the right acetabulum and supra-acetabular region, possible impaction fracture with some protrusio. Further workup with CT when clinically appropriate. Progressive degenerative changes. Electronically Signed   By: Karen Kays M.D.   On: 06/29/2023 13:45     Assessment and Plan:  Acetabular fracture of right hip Fracture of right inferior pubic ramus Osteopenia Patient suffered from a mechanical fall resulting in an acetabular fracture of the right hip and a fracture of the right inferior pubic ramus.  Orthopedics has been consulted and will follow.  Discussed with Dr. Jena Gauss, will likely plan for surgical procedure this weekend.  Her last dose of Eliquis was on the night of 11/6 and thus will need to await washout.  Surgical procedure will need to occur at [facility] and thus patient will be transferred there for further care. -Tylenol Q8 as needed for mild pain -Norco 5-325  mg Q6 as needed for moderate pain -Dilaudid 0.5 mg every 2 as needed for severe pain/breakthrough -Bed rest -Orthopedics following, appreciate assistance -transfer to [facility] for further care  Hypertension Paroxysmal atrial fibrillation -continue home toprol-xl 25mg  daily -holding eliquis given need for orthopedic surgical  repair  GERD -continue protonix (in place of home PPI)  Hypothyroidism -continue home synthroid 50 mcg     Advance Care Planning:   Code Status: Full Code   Consults: Orthopedics  Family Communication: Granddaughter at bedside, updated on current plan of care.  Severity of Illness: The appropriate patient status for this patient is INPATIENT. Inpatient status is judged to be reasonable and necessary in order to provide the required intensity of service to ensure the patient's safety. The patient's presenting symptoms, physical exam findings, and initial radiographic and laboratory data in the context of their chronic comorbidities is felt to place them at high risk for further clinical deterioration. Furthermore, it is not anticipated that the patient will be medically stable for discharge from the hospital within 2 midnights of admission.   * I certify that at the point of admission it is my clinical judgment that the patient will require inpatient hospital care spanning beyond 2 midnights from the point of admission due to high intensity of service, high risk for further deterioration and high frequency of surveillance required.*  Author: Briscoe Burns, MD 06/29/2023 7:12 PM  For on call review [email or web address].

## 2023-06-29 NOTE — Consult Note (Signed)
ORTHOPAEDIC CONSULTATION  REQUESTING PHYSICIAN: Briscoe Burns, MD  Chief Complaint: right hip pain after fall, acetabulum fracture  HPI:  HPI: Karen Allison is a 77 y.o. female with medical history significant of atrial fibrillation on Eliquis, hypertension, stage IV triple positive breast cancer, hypothyroidism presenting after mechanical fall.  She states that she was in her usual state of health until this morning.  She made herself some coffee and wore her slippers and as she was walking she tripped on herself and fell towards her right side.  She began experiencing a lot of pain and was unable to get up and moved to the couch.  Her family came to assist her but she remained in pain, which prompted her family to bring her into the hospital.  She denies any lightheadedness, chest pain, palpitations, loss of consciousness prior to the fall.  She denies any head trauma.   Patient does have a history of atrial fibrillation and is currently taking Eliquis 2.5 mg twice daily.  Her last dose was on the night of 11/6.    Past Medical History:  Diagnosis Date   A-fib [facility])    Acute respiratory failure with hypoxia (HCC) 05/09/2014   Adult hypothyroidism 05/03/2013   Arthritis    Blood dyscrasia    bleeds and bruises easily   Breast cancer (HCC)    left   Cervical spondylosis without myelopathy 05/06/2014   C3-4     Endometrial cancer (HCC) 01/21/1988   Fx lower humerus-closed 10/22/1997   Gait disorder 07/05/2016   Graves' disease 07/16/2021   HCAP (healthcare-associated pneumonia) 05/09/2014   Hepatitis     Hep A years ago   History of corticosteroid therapy 02/13/2023   History of endocrine disorder 02/13/2023   HNP (herniated nucleus pulposus), cervical 05/06/2014   Hypertension    Hyponatremia 12/02/2021   Hypothyroidism    Graves disease, age 45 yo   Malignant neoplasm of left breast (HCC) 06/05/2020   Nose trouble    right nostril will hemorrhage at times    Osteoporosis 11/04/2020   Other forms of scoliosis, thoracolumbar region 11/03/2016   Pneumonia    Rib pain on left side 09/26/2022   Rosacea    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   C3-4     Past Surgical History:  Procedure Laterality Date   ABDOMINAL HYSTERECTOMY     Age 19   ANTERIOR CERVICAL DECOMP/DISCECTOMY FUSION N/A 05/06/2014   Procedure: ANTERIOR CERVICAL DISCECTOMY FUSION C3-4 with transgraft, local bone graft, plate and screws;  Surgeon: Kerrin Champagne, MD;  Location: [facility] OR;  Service: Orthopedics;  Laterality: N/A;   APPENDECTOMY     BREAST LUMPECTOMY Left    COLONOSCOPY     EYE SURGERY Bilateral    cataracts   LUMBAR SPINE SURGERY  2011   Removal of hematoma   ORIF HUMERUS FRACTURE Left 2013   THORACIC SPINE SURGERY  2011   Bone cages   THYROIDECTOMY     TONSILLECTOMY     Social History   Socioeconomic History   Marital status: Married    Spouse name: Not on file   Number of children: 1   Years of education: 14   Highest education level: Not on file  Occupational History   Occupation: Retired  Tobacco Use  Smoking status: Former    Current packs/day: 0.00    Types: Cigarettes    Quit date: 04/23/1962    Years since quitting: 61.2   Smokeless tobacco: Never   Tobacco comments:    as teenager  Vaping Use   Vaping status: Never Used  Substance and Sexual Activity   Alcohol use: Not Currently    Comment: Social   Drug use: No   Sexual activity: Not Currently  Other Topics Concern   Not on file  Social History Narrative   Lives at home w/ her husband   Right-handed   Caffeine: 2 cups in the morning   Social Determinants of Health   Financial Resource Strain: Not on file  Food Insecurity: Not on file  Transportation Needs: Not on file  Physical Activity: Not  on file  Stress: Not on file  Social Connections: Not on file   Family History  Problem Relation Age of Onset   Congestive Heart Failure Mother    Heart disease Father    Rheum arthritis Daughter    Tongue cancer Grandchild    Breast cancer Neg Hx    Allergies  Allergen Reactions   Cefuroxime Hives   Zithromax [Azithromycin] Hives   Prior to Admission medications   Medication Sig Start Date End Date Taking? Authorizing Provider  apixaban (ELIQUIS) 5 MG TABS tablet Take 1/2 tablet twice daily 06/22/23   Baldo Daub, MD  B Complex Vitamins (B COMPLEX PO) Take 1 tablet by mouth daily.    [provider]  Calcium Carbonate-Vitamin D (CALTRATE 600+D PO) Take 1 tablet by mouth 2 (two) times daily.    [provider]  celecoxib (CELEBREX) 200 MG capsule Take 200 mg by mouth daily.  12/31/10   [provider]  cholecalciferol (VITAMIN D3) 25 MCG (1000 UNIT) tablet Take 1,000 Units by mouth daily.    [provider]  diazepam (VALIUM) 10 MG tablet Take 10 mg by mouth at bedtime.     [provider]  docusate sodium (COLACE) 100 MG capsule Take 200 mg by mouth 2 (two) times daily as needed for mild constipation.     [provider]  doxycycline (VIBRAMYCIN) 50 MG capsule Take 50 mg by mouth daily.    [provider]  estradiol (ESTRACE) 0.1 MG/GM vaginal cream Place 0.5 g vaginally 2 (two) times a week. Place 0.5g nightly for two weeks then twice a week after 03/09/23   Selmer Dominion, NP  fluconazole (DIFLUCAN) 150 MG tablet Take 150 mg by mouth once. 06/06/22   [provider]  furosemide (LASIX) 20 MG tablet Take 20-40 mg by mouth daily as needed for edema or fluid. 07/26/22   [provider]  gabapentin (NEURONTIN) 600 MG tablet Take 600-1,200 mg by mouth 4 (four) times daily - after meals and at bedtime. 1 tablet (600 mg) 3 times a day and 2 tablets(1200mg ) at bedtime    [provider]   HYDROcodone-acetaminophen (NORCO) 10-325 MG tablet Take 1 tablet by mouth every 6 (six) hours as needed for severe pain (pain score 7-10) or moderate pain (pain score 4-6).    [provider]  levothyroxine (SYNTHROID, LEVOTHROID) 50 MCG tablet Take 50 mcg by mouth daily.    [provider]  lidocaine-prilocaine (EMLA) cream Apply 1 Application topically as needed. 03/07/23   Selmer Dominion, NP  methocarbamol (ROBAXIN) 500 MG tablet Take 500 mg by mouth 4 (four) times daily.    [provider]  metoprolol succinate (TOPROL-XL) 25 MG 24 hr tablet Take 1 tablet (25 mg total) by mouth daily. 02/14/23   Baldo Daub, MD  metroNIDAZOLE (METROCREAM) 0.75 % cream Apply 1 Application topically 2 (two) times daily.    [provider]  Multiple Vitamin (MULTIVITAMIN WITH MINERALS) TABS Take 1 tablet by mouth daily.    [provider]  omeprazole (PRILOSEC) 20 MG capsule Take 1 capsule (20 mg total) by mouth 2 (two) times daily before a meal. 05/28/18   Kerrin Champagne, MD  ondansetron (ZOFRAN) 8 MG tablet Take 8 mg by mouth 2 (two) times daily as needed for nausea or vomiting. 04/08/22   [provider]  rosuvastatin (CRESTOR) 10 MG tablet Take 10 mg by mouth once a week. 07/27/22   [provider]  sennosides-docusate sodium (SENOKOT-S) 8.6-50 MG tablet Take 1 tablet by mouth daily.    [provider]  trastuzumab (HERCEPTIN) 440 MG injection 440 mg by Intravenous (Continuous Infusion) route every 21 ( twenty-one) days. infusion every 3 weeks 03/19/09   [provider]    Family History Reviewed and non-contributory, no pertinent history of problems with bleeding or anesthesia      Review of Systems 14 system ROS conducted and negative except for that noted in HPI   OBJECTIVE  Vitals:Patient Vitals for the past 8 hrs:  BP Temp Temp src Pulse Resp SpO2  06/29/23 2130 (!) 117/55 -- -- 83 18 100 %  06/29/23 1814 (!)  145/68 98 F (36.7 C) Oral 85 16 98 %   General: Alert, no acute distress Cardiovascular: Warm extremities noted Respiratory: No cyanosis, no use of accessory musculature GI: No organomegaly, abdomen is soft and non-tender Skin: No lesions in the area of chief complaint other than those listed below in MSK exam.  Neurologic: Sensation intact distally save for the below mentioned MSK exam Psychiatric: Patient is competent for consent with normal mood and affect Lymphatic: No swelling obvious and reported other than the area involved in the exam below Extremities   RLE: Skin at hip intact. TTP in groin. Pain with log roll and axial load at hip.  ROM deferred. + GS/TA/EHL. Sensation intact in DP/SP/S/S/P distributions. 2+ DP pulse with warm and well perfused digits. Compartments soft and compressible, with no pain on passive stretch.    Test Results Imaging CT PELVIS WO CONTRAST  Result Date: 06/29/2023 CLINICAL DATA:  Hip fracture EXAM: CT PELVIS WITHOUT CONTRAST TECHNIQUE: Multidetector CT imaging of the pelvis was performed following the standard protocol without intravenous contrast. RADIATION DOSE REDUCTION: This exam was performed according to the departmental dose-optimization program which includes automated exposure control, adjustment of the mA and/or kV according to patient size and/or use of iterative reconstruction technique. COMPARISON:  Standard CT scan 03/08/2023.  X-ray 06/29/2023 earlier. FINDINGS: Urinary Tract:  Preserved contours of the urinary bladder. Bowel: Visualized small and large bowel in the pelvis has a normal course and caliber. Scattered colonic stool. Vascular/Lymphatic: Diffuse vascular calcifications along the aorta and branch vessels. No abnormal lymph node enlargement identified in the visualized pelvis. Reproductive: Uterus is absent. No separate adnexal mass. Numerous surgical clips in the pelvis. Other: There is extraperitoneal right-sided pelvic wall  stranding and thickening with some small hematoma. Musculoskeletal: There is a comminuted fracture involving the right acetabulum with impaction of the femoral head of the hip joint. Fracture lines involve the supra-acetabular region, medial, posterior walls of the acetabula in particular. There is also some  protrusio of the femoral head along the fracture line defects medially. Separate fracture of the inferior pubic ramus on the right. The anterior column of the acetabulum is also involve. Degenerative changes of the sacroiliac joints with sclerosis. No additional fracture or dislocation. Degenerative changes of the hip joints. Some hypertrophic changes in the area of the pubic symphysis. Streak artifact from the hardware along the lower lumbar spine at the edge of the imaging field. IMPRESSION: Comminuted fracture involving the acetabulum diffusely with displaced fragments and a component protrusio from the femoral head. Separate fracture of the right inferior pubic ramus. Some extraperitoneal hematoma along the right pelvic sidewall. Degenerative changes.  Osteopenia. Electronically Signed   By: Karen Kays M.D.   On: 06/29/2023 15:15   DG Hip Unilat With Pelvis 2-3 Views Right  Result Date: 06/29/2023 CLINICAL DATA:  Fall.  Right hip pain EXAM: DG HIP (WITH OR WITHOUT PELVIS) 3V RIGHT COMPARISON:  X-ray 10/11/2018 FINDINGS: Progressive joint space loss axially of the hip joints with some osteophytes. Hyperostosis. There is some deformity of the right acetabulum and some lucency with sclerosis involving the right supra-acetabular region which was not seen previously. Is also deformity along the right acetabulum with some protrusio. A focal bony abnormality is possible recommend further workup with CT when clinically appropriate fixation hardware seen along the lower lumbar spine. Numerous surgical clips in the pelvis. IMPRESSION: Heterogeneous appearance of the right acetabulum and supra-acetabular region,  possible impaction fracture with some protrusio. Further workup with CT when clinically appropriate. Progressive degenerative changes. Electronically Signed   By: Karen Kays M.D.   On: 06/29/2023 13:45    Labs cbc Recent Labs    06/29/23 1435  WBC 13.1*  HGB 12.6  HCT 39.0  PLT 143*    Labs inflam No results for input(s): "CRP" in the last 72 hours.  Invalid input(s): "ESR"  Labs coag No results for input(s): "INR", "PTT" in the last 72 hours.  Invalid input(s): "PT"  Recent Labs    06/29/23 1435  NA 135  K 3.6  CL 95*  CO2 28  GLUCOSE 140*  BUN 15  CREATININE 0.67  CALCIUM 8.9     ASSESSMENT AND PLAN: 77 y.o. female with the following: Complex comminuted fracture of right acetabulum  This patient requires inpatient admission to manage this problem appropriately. She will need admission to the medical service with transfer to [facility] for further treatment with the orthopaedic trauma team. The ortho trauma team will be contacted to assess for further care and intervention is necessary  - Weight Bearing Status/Activity: NWB, bed rest  - Additional recommended labs/tests: Inlet/Outlet and Judet Xrays of pelvis  -VTE Prophylaxis: Hold home eliquis dose,   - Pain control: PO medications with IV medications for breakthrough pain

## 2023-06-29 NOTE — ED Notes (Signed)
ED TO INPATIENT HANDOFF REPORT  Name/Age/Gender Karen Allison 77 y.o. female  Code Status    Code Status Orders  (From admission, onward)           Start     Ordered   06/29/23 1757  Full code  Continuous       Question:  By:  Answer:  Consent: discussion documented in EHR   06/29/23 1806           Code Status History     Date Active Date Inactive Code Status Order ID Comments User Context   05/09/2014 0245 05/13/2014 1939 Full Code [id number]  Cristal Ford, MD Inpatient   05/06/2014 1506 05/07/2014 1711 Full Code [id number]  Wende Neighbors, PA-C Inpatient      Advance Directive Documentation    Flowsheet Row Most Recent Value  Type of Advance Directive Out of facility DNR (pink MOST or yellow form)  [Pt would like her info on MOST form]  Pre-existing out of facility DNR order (yellow form or pink MOST form) --  "MOST" Form in Place? --       Home/SNF/Other Home  Chief Complaint Acetabular fracture (HCC) [S32.409A]  Level of Care/Admitting Diagnosis ED Disposition     ED Disposition  Admit   Condition  --   Comment  Hospital Area: [facility] [[id number]]  Level of Care: Telemetry Surgical [105]  May admit patient to [facility] or [facility] if equivalent level of care is available:: No  Covid Evaluation: Asymptomatic - no recent exposure (last 10 days) testing not required  Diagnosis: Acetabular fracture [facility]) [[id number]]  Admitting Physician: Briscoe Burns [[id number]]  Attending Physician: Briscoe Burns [[id number]]  Certification:: I certify this patient will need inpatient services for at least 2 midnights  Expected Medical Readiness: 07/04/2023          Medical History Past Medical History:  Diagnosis Date   A-fib [facility])    Acute respiratory failure with hypoxia (HCC) 05/09/2014   Adult hypothyroidism 05/03/2013   Arthritis    Blood dyscrasia    bleeds and bruises easily   Breast cancer (HCC)    left    Cervical spondylosis without myelopathy 05/06/2014   C3-4     Endometrial cancer (HCC) 01/21/1988   Fx lower humerus-closed 10/22/1997   Gait disorder 07/05/2016   Graves' disease 07/16/2021   HCAP (healthcare-associated pneumonia) 05/09/2014   Hepatitis     Hep A years ago   History of corticosteroid therapy 02/13/2023   History of endocrine disorder 02/13/2023   HNP (herniated nucleus pulposus), cervical 05/06/2014   Hypertension    Hyponatremia 12/02/2021   Hypothyroidism    Graves disease, age 72 yo   Malignant neoplasm of left breast (HCC) 06/05/2020   Nose trouble    right nostril will hemorrhage at times   Osteoporosis 11/04/2020   Other forms of scoliosis, thoracolumbar region 11/03/2016   Pneumonia    Rib pain on left side 09/26/2022   Rosacea    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   C3-4      Allergies Allergies  Allergen Reactions   Cefuroxime Hives   Zithromax [Azithromycin] Hives    IV Location/Drains/Wounds Patient Lines/Drains/Airways Status     Active Line/Drains/Airways     Name  Placement date Placement time Site Days   Peripheral IV 06/29/23 20 G Right Antecubital 06/29/23  1221  Antecubital  less than 1            Labs/Imaging Results for orders placed or performed during the hospital encounter of 06/29/23 (from the past 48 hour(s))  Basic metabolic panel     Status: Abnormal   Collection Time: 06/29/23  2:35 PM  Result Value Ref Range   Sodium 135 135 - 145 mmol/L   Potassium 3.6 3.5 - 5.1 mmol/L   Chloride 95 (L) 98 - 111 mmol/L   CO2 28 22 - 32 mmol/L   Glucose, Bld 140 (H) 70 - 99 mg/dL    Comment: Glucose reference range applies only to samples taken after fasting for at least 8 hours.   BUN 15 8 - 23 mg/dL   Creatinine, Ser 1.61 0.44 - 1.00  mg/dL   Calcium 8.9 8.9 - 09.6 mg/dL   GFR, Estimated >04 >54 mL/min    Comment: (NOTE) Calculated using the CKD-EPI Creatinine Equation (2021)    Anion gap 12 5 - 15    Comment: Performed at [facility], [address] [place], [place] [address]  CBC with Differential     Status: Abnormal   Collection Time: 06/29/23  2:35 PM  Result Value Ref Range   WBC 13.1 (H) 4.0 - 10.5 K/uL   RBC 3.99 3.87 - 5.11 MIL/uL   Hemoglobin 12.6 12.0 - 15.0 g/dL   HCT 91.4 78.2 - 95.6 %   MCV 97.7 80.0 - 100.0 fL   MCH 31.6 26.0 - 34.0 pg   MCHC 32.3 30.0 - 36.0 g/dL   RDW 21.3 08.6 - 57.8 %   Platelets 143 (L) 150 - 400 K/uL   nRBC 0.0 0.0 - 0.2 %   Neutrophils Relative % 87 %   Neutro Abs 11.4 (H) 1.7 - 7.7 K/uL   Lymphocytes Relative 7 %   Lymphs Abs 0.9 0.7 - 4.0 K/uL   Monocytes Relative 6 %   Monocytes Absolute 0.8 0.1 - 1.0 K/uL   Eosinophils Relative 0 %   Eosinophils Absolute 0.0 0.0 - 0.5 K/uL   Basophils Relative 0 %   Basophils Absolute 0.0 0.0 - 0.1 K/uL   Immature Granulocytes 0 %   Abs Immature Granulocytes 0.04 0.00 - 0.07 K/uL    Comment: Performed at [facility], [address], [place], [place] [address]   CT PELVIS WO CONTRAST  Result Date: 06/29/2023 CLINICAL DATA:  Hip fracture EXAM: CT PELVIS WITHOUT CONTRAST TECHNIQUE: Multidetector CT imaging of the pelvis was performed following the standard protocol without intravenous contrast. RADIATION DOSE REDUCTION: This exam was performed according to the departmental dose-optimization program which includes automated exposure control, adjustment of the mA and/or kV according to patient size and/or use of iterative reconstruction technique. COMPARISON:  Standard CT scan 03/08/2023.  X-ray 06/29/2023 earlier. FINDINGS: Urinary Tract:  Preserved contours of the urinary bladder. Bowel: Visualized small and large bowel in the pelvis has a normal course and caliber. Scattered colonic stool.  Vascular/Lymphatic: Diffuse vascular calcifications along the aorta and branch vessels. No abnormal lymph node enlargement identified in the visualized pelvis. Reproductive: Uterus is absent. No separate adnexal mass. Numerous surgical clips in the pelvis. Other: There is extraperitoneal right-sided pelvic wall stranding and thickening with some small hematoma. Musculoskeletal: There is a comminuted fracture involving the right acetabulum with impaction of the femoral head of the hip  joint. Fracture lines involve the supra-acetabular region, medial, posterior walls of the acetabula in particular. There is also some protrusio of the femoral head along the fracture line defects medially. Separate fracture of the inferior pubic ramus on the right. The anterior column of the acetabulum is also involve. Degenerative changes of the sacroiliac joints with sclerosis. No additional fracture or dislocation. Degenerative changes of the hip joints. Some hypertrophic changes in the area of the pubic symphysis. Streak artifact from the hardware along the lower lumbar spine at the edge of the imaging field. IMPRESSION: Comminuted fracture involving the acetabulum diffusely with displaced fragments and a component protrusio from the femoral head. Separate fracture of the right inferior pubic ramus. Some extraperitoneal hematoma along the right pelvic sidewall. Degenerative changes.  Osteopenia. Electronically Signed   By: Karen Kays M.D.   On: 06/29/2023 15:15   DG Hip Unilat With Pelvis 2-3 Views Right  Result Date: 06/29/2023 CLINICAL DATA:  Fall.  Right hip pain EXAM: DG HIP (WITH OR WITHOUT PELVIS) 3V RIGHT COMPARISON:  X-ray 10/11/2018 FINDINGS: Progressive joint space loss axially of the hip joints with some osteophytes. Hyperostosis. There is some deformity of the right acetabulum and some lucency with sclerosis involving the right supra-acetabular region which was not seen previously. Is also deformity along the right  acetabulum with some protrusio. A focal bony abnormality is possible recommend further workup with CT when clinically appropriate fixation hardware seen along the lower lumbar spine. Numerous surgical clips in the pelvis. IMPRESSION: Heterogeneous appearance of the right acetabulum and supra-acetabular region, possible impaction fracture with some protrusio. Further workup with CT when clinically appropriate. Progressive degenerative changes. Electronically Signed   By: Karen Kays M.D.   On: 06/29/2023 13:45    Pending Labs Unresulted Labs (From admission, onward)     Start     Ordered   06/30/23 0500  CBC  Tomorrow morning,   R        06/29/23 1806   06/30/23 0500  Basic metabolic panel  Tomorrow morning,   R        06/29/23 1806            Vitals/Pain Today's Vitals   06/29/23 1807 06/29/23 1814 06/29/23 2130 06/29/23 2131  BP:  (!) 145/68 (!) 117/55   Pulse:  85 83   Resp:  16 18   Temp:  98 F (36.7 C)    TempSrc:  Oral    SpO2:  98% 100%   Weight:      Height:      PainSc: 8    8     Isolation Precautions No active isolations  Medications Medications  HYDROcodone-acetaminophen (NORCO/VICODIN) 5-325 MG per tablet 1 tablet (has no administration in time range)  HYDROmorphone (DILAUDID) injection 0.5 mg (0.5 mg Intravenous Given 06/29/23 2128)  polyethylene glycol (MIRALAX / GLYCOLAX) packet 17 g (has no administration in time range)  gabapentin (NEURONTIN) tablet 600 mg (has no administration in time range)  levothyroxine (SYNTHROID) tablet 50 mcg (has no administration in time range)  metoprolol succinate (TOPROL-XL) 24 hr tablet 25 mg (has no administration in time range)  rosuvastatin (CRESTOR) tablet 10 mg (has no administration in time range)  pantoprazole (PROTONIX) EC tablet 40 mg (has no administration in time range)  acetaminophen (TYLENOL) tablet 650 mg (has no administration in time range)  HYDROmorphone (DILAUDID) injection 0.5 mg (0.5 mg Intravenous  Given 06/29/23 1222)  HYDROmorphone (DILAUDID) injection 0.5 mg (0.5 mg Intravenous Given 06/29/23  1414)  acetaminophen (TYLENOL) tablet 1,000 mg (1,000 mg Oral Given 06/29/23 1415)    Mobility walks

## 2023-06-29 NOTE — ED Notes (Signed)
Karen Allison received report

## 2023-06-29 NOTE — ED Notes (Signed)
[facility] called for transport. 

## 2023-06-30 DIAGNOSIS — I10 Essential (primary) hypertension: Secondary | ICD-10-CM | POA: Diagnosis not present

## 2023-06-30 DIAGNOSIS — I4891 Unspecified atrial fibrillation: Secondary | ICD-10-CM

## 2023-06-30 DIAGNOSIS — E039 Hypothyroidism, unspecified: Secondary | ICD-10-CM

## 2023-06-30 DIAGNOSIS — S32401D Unspecified fracture of right acetabulum, subsequent encounter for fracture with routine healing: Secondary | ICD-10-CM

## 2023-06-30 LAB — CBC
HCT: 34.9 % — ABNORMAL LOW (ref 36.0–46.0)
Hemoglobin: 11.6 g/dL — ABNORMAL LOW (ref 12.0–15.0)
MCH: 31.4 pg (ref 26.0–34.0)
MCHC: 33.2 g/dL (ref 30.0–36.0)
MCV: 94.6 fL (ref 80.0–100.0)
Platelets: 110 10*3/uL — ABNORMAL LOW (ref 150–400)
RBC: 3.69 MIL/uL — ABNORMAL LOW (ref 3.87–5.11)
RDW: 13 % (ref 11.5–15.5)
WBC: 10.9 10*3/uL — ABNORMAL HIGH (ref 4.0–10.5)
nRBC: 0 % (ref 0.0–0.2)

## 2023-06-30 LAB — BASIC METABOLIC PANEL
Anion gap: 7 (ref 5–15)
BUN: 11 mg/dL (ref 8–23)
CO2: 27 mmol/L (ref 22–32)
Calcium: 7.9 mg/dL — ABNORMAL LOW (ref 8.9–10.3)
Chloride: 97 mmol/L — ABNORMAL LOW (ref 98–111)
Creatinine, Ser: 0.68 mg/dL (ref 0.44–1.00)
GFR, Estimated: >60 mL/min (ref >60)
Glucose, Bld: 126 mg/dL — ABNORMAL HIGH (ref 70–99)
Potassium: 4.1 mmol/L (ref 3.5–5.1)
Sodium: 131 mmol/L — ABNORMAL LOW (ref 135–145)

## 2023-06-30 LAB — PROTIME-INR
INR: 1.4 — ABNORMAL HIGH (ref 0.8–1.2)
Prothrombin Time: 16.9 s — ABNORMAL HIGH (ref 11.4–15.2)

## 2023-06-30 LAB — SURGICAL PCR SCREEN
MRSA, PCR: NEGATIVE
Staphylococcus aureus: NEGATIVE

## 2023-06-30 LAB — MAGNESIUM: Magnesium: 1.6 mg/dL — ABNORMAL LOW (ref 1.7–2.4)

## 2023-06-30 LAB — PHOSPHORUS: Phosphorus: 3.2 mg/dL (ref 2.5–4.6)

## 2023-06-30 MED ORDER — MAGNESIUM SULFATE 2 GM/50ML IV SOLN
2.0000 g | Freq: Once | INTRAVENOUS | Status: AC
  Administered 2023-06-30: 2 g via INTRAVENOUS
  Filled 2023-06-30: qty 50

## 2023-06-30 MED ORDER — METOPROLOL TARTRATE 5 MG/5ML IV SOLN
2.5000 mg | Freq: Once | INTRAVENOUS | Status: AC
  Administered 2023-06-30: 2.5 mg via INTRAVENOUS
  Filled 2023-06-30: qty 5

## 2023-06-30 MED ORDER — METOPROLOL SUCCINATE ER 25 MG PO TB24
25.0000 mg | ORAL_TABLET | Freq: Every day | ORAL | Status: DC
  Administered 2023-06-30: 25 mg via ORAL
  Filled 2023-06-30: qty 1

## 2023-06-30 MED ORDER — LACTATED RINGERS IV SOLN: INTRAVENOUS | Status: DC

## 2023-06-30 MED ORDER — METHOCARBAMOL 500 MG PO TABS
500.0000 mg | ORAL_TABLET | Freq: Four times a day (QID) | ORAL | Status: DC | PRN
  Administered 2023-07-01 – 2023-07-04 (×2): 500 mg via ORAL
  Filled 2023-06-30 (×2): qty 1

## 2023-06-30 MED ORDER — METOPROLOL SUCCINATE ER 25 MG PO TB24: 25.0000 mg | ORAL_TABLET | Freq: Every day | ORAL | Status: DC

## 2023-06-30 MED ORDER — LACTATED RINGERS IV BOLUS
500.0000 mL | Freq: Once | INTRAVENOUS | Status: AC
  Administered 2023-06-30: 500 mL via INTRAVENOUS

## 2023-06-30 MED ORDER — DILTIAZEM HCL 30 MG PO TABS
30.0000 mg | ORAL_TABLET | Freq: Three times a day (TID) | ORAL | Status: DC
  Administered 2023-06-30 – 2023-07-02 (×4): 30 mg via ORAL
  Filled 2023-06-30 (×6): qty 1

## 2023-06-30 MED ORDER — POTASSIUM CHLORIDE CRYS ER 20 MEQ PO TBCR
40.0000 meq | EXTENDED_RELEASE_TABLET | Freq: Once | ORAL | Status: AC
  Administered 2023-06-30: 40 meq via ORAL
  Filled 2023-06-30: qty 2

## 2023-06-30 NOTE — Progress Notes (Signed)
[facility]  PROGRESS NOTE  Karen Allison ZOX:[id number] DOB: 12-17-1945 DOA: 06/29/2023 PCP: Philemon Kingdom, MD   Brief HPI:   77 y.o. female with medical history significant of atrial fibrillation on Eliquis, hypertension, stage IV triple positive breast cancer, hypothyroidism presenting after mechanical fall.  She states that she was in her usual state of health until this morning.  She made herself some coffee and wore her slippers and as she was walking she tripped on herself and fell towards her right side. Patient does have a history of atrial fibrillation and is currently taking Eliquis 2.5 mg twice daily. Her last dose was on the night of 11/6.  Orthopedics was consulted    Assessment/Plan:   Right acetabulum fracture -Orthopedics consulted; plan for ORIF on Monday -Eliquis on hold for surgery  Atrial fibrillation with RVR -Went into A-fib with RVR last night -Given extra dose of metoprolol IV -Continue metoprolol 25 mg p.o. daily -Anticoagulation with Eliquis on hold  Hypertension -Blood pressure is on low side -Continue Toprol XL  Hypothyroidism -Continue Synthroid 50 mcg daily  GERD -Continue p.o. Protonix    Medications     gabapentin  600 mg Oral BID   levothyroxine  50 mcg Oral Q0600   metoprolol succinate  25 mg Oral Daily   pantoprazole  40 mg Oral Daily   [START ON 07/02/2023] rosuvastatin  10 mg Oral Q Sun     Data Reviewed:   CBG:  No results for input(s): "GLUCAP" in the last 168 hours.  SpO2: 98 % O2 Flow Rate (L/min): 2 L/min    Vitals:   06/30/23 0253 06/30/23 0500 06/30/23 0640 06/30/23 0800  BP: 118/74 113/61    Pulse: (!) 121   (!) 101  Resp:    14  Temp: 98.9 F (37.2 C) 98 F (36.7 C) 98 F (36.7 C)   TempSrc: Oral Oral Oral   SpO2: 98%   98%  Weight:      Height:          Data Reviewed:  Basic Metabolic Panel: Recent Labs  Lab 06/29/23 1435 06/30/23 0610  NA 135 131*  K 3.6 4.1  CL 95* 97*   CO2 28 27  GLUCOSE 140* 126*  BUN 15 11  CREATININE 0.67 0.68  CALCIUM 8.9 7.9*  MG  --  1.6*  PHOS  --  3.2    CBC: Recent Labs  Lab 06/29/23 1435 06/30/23 0610  WBC 13.1* 10.9*  NEUTROABS 11.4*  --   HGB 12.6 11.6*  HCT 39.0 34.9*  MCV 97.7 94.6  PLT 143* 110*    LFT No results for input(s): "AST", "ALT", "ALKPHOS", "BILITOT", "PROT", "ALBUMIN" in the last 168 hours.   Antibiotics: Anti-infectives (From admission, onward)    None        DVT prophylaxis: Eliquis on hold  Code Status: Full code  Family Communication: Discussed with patient's daughter at bedside   CONSULTS orthopedics   Subjective   Denies pain.  Went into A-fib with RVR last night.  Given IV metoprolol.  Heart rate is well-controlled   Objective    Physical Examination:  General-appears in no acute distress Heart-S1-S2, irregular, no murmur auscultated Lungs-clear to auscultation bilaterally, no wheezing or crackles auscultated Abdomen-soft, nontender, no organomegaly Extremities-no edema in the lower extremities Neuro-alert, oriented x3, no focal deficit noted  Status is: Inpatient:             Meredeth Ide   [facility] If  7PM-7AM, please contact night-coverage at [email or web address], Office  [phone]   06/30/2023, 8:55 AM  LOS: 1 day

## 2023-06-30 NOTE — Significant Event (Signed)
Significant event note:   Notified by RN of Afib with RVR, rates in 120s. On interview she has pain in her hip, otherwise no chest pain, SOB, lightheadedness. On exam tachycardic, irregular, rates in 120's, well perfused, no peripheral edema. Data mild hypoKalemia.   Assessment:   Afib with RVR Overall rates in 120s without associated symptoms. Suspect likely driven by hip fracture, dehydration, pain. Also takes metoprolol XR in evening, has not received since 11/6 eve.  - Give home Metoprolol 25 mg XR  - LR 500 cc x 1  - Pain control per prn meds  - If rates fail to improve, can give low dose metoprolol IV, uptitrate her oral metoprolol as tolerated.   Dolly Rias, MD  [facility]

## 2023-06-30 NOTE — H&P (View-Only) (Signed)
Reason for Consult:Right acetabulum fx Referring Physician: Floyde Parkins Time called: 0730 Time at bedside: 0913   Karen Allison is an 77 y.o. female.  HPI: Karen Allison was at home and tripped and fell onto her right side. She had immediate hip pain and could not get up. She was brought to the ED where x-rays showed a right acetabulum fx. Due to the complex nature of the injury orthopedic trauma consultation was requested. She lives at home with her husband and ambulates with a RW.  Past Medical History:  Diagnosis Date   A-fib [facility])    Acute respiratory failure with hypoxia (HCC) 05/09/2014   Adult hypothyroidism 05/03/2013   Arthritis    Blood dyscrasia    bleeds and bruises easily   Breast cancer (HCC)    left   Cervical spondylosis without myelopathy 05/06/2014   C3-4     Endometrial cancer (HCC) 01/21/1988   Fx lower humerus-closed 10/22/1997   Gait disorder 07/05/2016   Graves' disease 07/16/2021   HCAP (healthcare-associated pneumonia) 05/09/2014   Hepatitis     Hep A years ago   History of corticosteroid therapy 02/13/2023   History of endocrine disorder 02/13/2023   HNP (herniated nucleus pulposus), cervical 05/06/2014   Hypertension    Hyponatremia 12/02/2021   Hypothyroidism    Graves disease, age 42 yo   Malignant neoplasm of left breast (HCC) 06/05/2020   Nose trouble    right nostril will hemorrhage at times   Osteoporosis 11/04/2020   Other forms of scoliosis, thoracolumbar region 11/03/2016   Pneumonia    Rib pain on left side 09/26/2022   Rosacea    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   C3-4      Past Surgical History:  Procedure Laterality Date   ABDOMINAL HYSTERECTOMY     Age 50   ANTERIOR CERVICAL DECOMP/DISCECTOMY FUSION N/A 05/06/2014   Procedure: ANTERIOR  CERVICAL DISCECTOMY FUSION C3-4 with transgraft, local bone graft, plate and screws;  Surgeon: Kerrin Champagne, MD;  Location: [facility] OR;  Service: Orthopedics;  Laterality: N/A;   APPENDECTOMY     BREAST LUMPECTOMY Left    COLONOSCOPY     EYE SURGERY Bilateral    cataracts   LUMBAR SPINE SURGERY  2011   Removal of hematoma   ORIF HUMERUS FRACTURE Left 2013   THORACIC SPINE SURGERY  2011   Bone cages   THYROIDECTOMY     TONSILLECTOMY      Family History  Problem Relation Age of Onset   Congestive Heart Failure Mother    Heart disease Father    Rheum arthritis Daughter    Tongue cancer Grandchild    Breast cancer Neg Hx     Social History:  reports that she quit smoking about 61 years ago. Her smoking use included cigarettes. She has never used smokeless tobacco. She reports that she does not currently use alcohol. She reports that she does not use drugs.  Allergies:  Allergies  Allergen Reactions   Cefuroxime Hives   Zithromax [Azithromycin] Hives    Medications: I have reviewed the patient's current medications.  Results for orders placed or performed during the hospital encounter of 06/29/23 (from the past 48 hour(s))  Basic metabolic panel     Status: Abnormal   Collection Time: 06/29/23  2:35 PM  Result Value Ref Range   Sodium 135 135 - 145 mmol/L   Potassium 3.6 3.5 - 5.1 mmol/L   Chloride 95 (L) 98 - 111 mmol/L   CO2 28 22 - 32 mmol/L   Glucose, Bld 140 (H) 70 - 99 mg/dL    Comment: Glucose reference range applies only to samples taken after fasting for at least 8 hours.   BUN 15 8 - 23 mg/dL   Creatinine, Ser 4.09 0.44 - 1.00 mg/dL   Calcium 8.9 8.9 - 81.1 mg/dL   GFR, Estimated >91 >47 mL/min    Comment: (NOTE) Calculated using the CKD-EPI Creatinine Equation (2021)    Anion gap 12 5 - 15    Comment: Performed at [facility], [address] [place], [place] [address]  CBC with Differential     Status: Abnormal   Collection Time: 06/29/23   2:35 PM  Result Value Ref Range   WBC 13.1 (H) 4.0 - 10.5 K/uL   RBC 3.99 3.87 - 5.11 MIL/uL   Hemoglobin 12.6 12.0 - 15.0 g/dL   HCT 21.3 08.6 - 57.8 %   MCV 97.7 80.0 - 100.0 fL   MCH 31.6 26.0 - 34.0 pg   MCHC 32.3 30.0 - 36.0 g/dL   RDW 46.9 62.9 - 52.8 %   Platelets 143 (L) 150 - 400 K/uL   nRBC 0.0 0.0 - 0.2 %   Neutrophils Relative % 87 %   Neutro Abs 11.4 (H) 1.7 - 7.7 K/uL   Lymphocytes Relative 7 %   Lymphs Abs 0.9 0.7 - 4.0 K/uL   Monocytes Relative 6 %   Monocytes Absolute 0.8 0.1 - 1.0 K/uL   Eosinophils Relative 0 %   Eosinophils Absolute 0.0 0.0 - 0.5 K/uL   Basophils Relative 0 %   Basophils Absolute 0.0 0.0 - 0.1 K/uL   Immature Granulocytes 0 %   Abs Immature Granulocytes 0.04 0.00 - 0.07 K/uL    Comment: Performed at [facility], [address] [place], [place] [address]  Basic metabolic panel     Status: Abnormal   Collection Time: 06/30/23  6:10 AM  Result Value Ref Range   Sodium 131 (L) 135 - 145 mmol/L   Potassium 4.1 3.5 - 5.1 mmol/L   Chloride 97 (L) 98 - 111 mmol/L   CO2 27 22 - 32 mmol/L   Glucose, Bld 126 (H) 70 - 99 mg/dL    Comment: Glucose reference range applies only to samples taken after fasting for at least 8 hours.   BUN 11 8 - 23 mg/dL   Creatinine, Ser 4.01 0.44 - 1.00 mg/dL   Calcium 7.9 (L) 8.9 - 10.3 mg/dL   GFR, Estimated >02 >72 mL/min    Comment: (NOTE) Calculated using the CKD-EPI Creatinine Equation (2021)    Anion gap 7 5 - 15    Comment: Performed at [facility], [address] [place], [place] [address]  Magnesium     Status: Abnormal   Collection Time: 06/30/23  6:10 AM  Result Value Ref Range   Magnesium 1.6 (L) 1.7 - 2.4 mg/dL    Comment: Performed at [facility], [address] [place], [place] [address]  Phosphorus     Status: None   Collection Time: 06/30/23  6:10 AM  Result Value Ref Range   Phosphorus 3.2 2.5 - 4.6 mg/dL    Comment: Performed at [facility],  [address] [place], [place] [address]  CBC     Status: Abnormal   Collection Time: 06/30/23  6:10 AM  Result Value Ref Range   WBC 10.9 (H) 4.0 - 10.5 K/uL   RBC 3.69 (L) 3.87 - 5.11 MIL/uL   Hemoglobin 11.6 (L) 12.0 - 15.0 g/dL   HCT 66.0 (L) 63.0 - 16.0 %   MCV 94.6 80.0 - 100.0 fL   MCH 31.4 26.0 - 34.0 pg   MCHC 33.2 30.0 - 36.0 g/dL   RDW 10.9 32.3 - 55.7 %   Platelets 110 (L) 150 - 400 K/uL    Comment: REPEATED TO VERIFY   nRBC 0.0 0.0 - 0.2 %    Comment: Performed at [facility], [address], [place], [place] [address]  Protime-INR     Status: Abnormal   Collection Time: 06/30/23  6:10 AM  Result Value Ref Range   Prothrombin Time 16.9 (H) 11.4 - 15.2 seconds   INR 1.4 (H) 0.8 - 1.2    Comment: (NOTE) INR goal varies based on device and disease states. Performed at [facility], [address] [place], [place] [address]     CT PELVIS WO CONTRAST  Result Date: 06/29/2023 CLINICAL DATA:  Hip fracture EXAM: CT PELVIS WITHOUT CONTRAST TECHNIQUE: Multidetector CT imaging of the pelvis was performed following the standard protocol without intravenous contrast. RADIATION DOSE REDUCTION: This exam was performed according to the departmental dose-optimization program which includes automated exposure control, adjustment of the mA and/or kV according to patient size and/or use of iterative reconstruction technique. COMPARISON:  Standard CT scan 03/08/2023.  X-ray 06/29/2023 earlier. FINDINGS: Urinary Tract:  Preserved contours of the urinary bladder. Bowel: Visualized small and large bowel in the pelvis has a normal course and caliber. Scattered colonic stool. Vascular/Lymphatic: Diffuse vascular calcifications along the aorta and branch vessels. No abnormal lymph node enlargement identified in the visualized pelvis. Reproductive: Uterus is absent. No separate adnexal mass. Numerous surgical clips in the pelvis. Other: There is extraperitoneal right-sided pelvic wall  stranding and thickening with some small hematoma. Musculoskeletal: There is a comminuted fracture involving the right acetabulum with impaction of the femoral head of the hip joint. Fracture lines involve the supra-acetabular region, medial, posterior walls of the acetabula in particular. There is also some protrusio of the femoral head along the fracture line defects medially. Separate fracture of the inferior pubic ramus on the right. The anterior column of the acetabulum is also involve. Degenerative changes of the sacroiliac joints with sclerosis. No additional fracture or dislocation. Degenerative changes of the hip joints. Some hypertrophic changes in the area of the pubic symphysis. Streak artifact from the hardware along the lower lumbar spine at the edge of the imaging field. IMPRESSION: Comminuted fracture involving the acetabulum diffusely with displaced fragments and a component protrusio from the femoral head. Separate fracture of the right inferior pubic ramus. Some extraperitoneal hematoma along the right pelvic sidewall. Degenerative changes.  Osteopenia. Electronically Signed   By: Karen Kays M.D.   On: 06/29/2023 15:15   DG Hip Unilat With Pelvis 2-3 Views Right  Result Date: 06/29/2023 CLINICAL DATA:  Fall.  Right hip pain EXAM: DG HIP (WITH OR WITHOUT PELVIS) 3V RIGHT COMPARISON:  X-ray 10/11/2018 FINDINGS: Progressive joint space loss axially of the hip joints with some osteophytes. Hyperostosis. There is some deformity of the right acetabulum and some lucency with sclerosis involving the right supra-acetabular region which was not seen previously. Is  also deformity along the right acetabulum with some protrusio. A focal bony abnormality is possible recommend further workup with CT when clinically appropriate fixation hardware seen along the lower lumbar spine. Numerous surgical clips in the pelvis. IMPRESSION: Heterogeneous appearance of the right acetabulum and supra-acetabular region,  possible impaction fracture with some protrusio. Further workup with CT when clinically appropriate. Progressive degenerative changes. Electronically Signed   By: Karen Kays M.D.   On: 06/29/2023 13:45    Review of Systems  HENT:  Negative for ear discharge, ear pain, hearing loss and tinnitus.   Eyes:  Negative for photophobia and pain.  Respiratory:  Negative for cough and shortness of breath.   Cardiovascular:  Negative for chest pain.  Gastrointestinal:  Negative for abdominal pain, nausea and vomiting.  Genitourinary:  Negative for dysuria, flank pain, frequency and urgency.  Musculoskeletal:  Positive for arthralgias (Right hip). Negative for back pain, myalgias and neck pain.  Neurological:  Negative for dizziness and headaches.  Hematological:  Does not bruise/bleed easily.  Psychiatric/Behavioral:  The patient is not nervous/anxious.    Blood pressure 113/61, pulse (!) 101, temperature 98 F (36.7 C), temperature source Oral, resp. rate 14, height 5\' 1"  (1.549 m), weight 60 kg, SpO2 98%. Physical Exam Constitutional:      General: She is not in acute distress.    Appearance: She is well-developed. She is not diaphoretic.  HENT:     Head: Normocephalic and atraumatic.  Eyes:     General: No scleral icterus.       Right eye: No discharge.        Left eye: No discharge.     Conjunctiva/sclera: Conjunctivae normal.  Cardiovascular:     Rate and Rhythm: Normal rate and regular rhythm.  Pulmonary:     Effort: Pulmonary effort is normal. No respiratory distress.  Musculoskeletal:     Cervical back: Normal range of motion.     Comments: RLE No traumatic wounds, ecchymosis, or rash  Mild TTP hip  No knee or ankle effusion  Knee stable to varus/ valgus and anterior/posterior stress  Sens DPN, SPN, TN intact  Motor EHL, ext, flex, evers 5/5  DP 1+, PT 1+, No significant edema  Skin:    General: Skin is warm and dry.  Neurological:     Mental Status: She is alert.   Psychiatric:        Mood and Affect: Mood normal.        Behavior: Behavior normal.    Assessment/Plan: Right acetabulum fx -- Plan ORIF Monday with Dr. Jena Gauss. Multiple medical problems including atrial fibrillation on Eliquis, hypertension, stage IV triple positive breast cancer, hypothyroidism -- per primary service. Please continue to hold Eliquis.    Freeman Caldron, PA-C Orthopedic Surgery [phone] 06/30/2023, 9:41 AM

## 2023-06-30 NOTE — Progress Notes (Signed)
Md notified of elevated heart rate in upper 120's, see Mar for new orders

## 2023-06-30 NOTE — Consult Note (Signed)
Reason for Consult:Right acetabulum fx Referring Physician: Floyde Parkins Time called: 0730 Time at bedside: 0913   Karen Allison is an 77 y.o. female.  HPI: Karen Allison was at home and tripped and fell onto her right side. She had immediate hip pain and could not get up. She was brought to the ED where x-rays showed a right acetabulum fx. Due to the complex nature of the injury orthopedic trauma consultation was requested. She lives at home with her husband and ambulates with a RW.  Past Medical History:  Diagnosis Date   A-fib [facility])    Acute respiratory failure with hypoxia (HCC) 05/09/2014   Adult hypothyroidism 05/03/2013   Arthritis    Blood dyscrasia    bleeds and bruises easily   Breast cancer (HCC)    left   Cervical spondylosis without myelopathy 05/06/2014   C3-4     Endometrial cancer (HCC) 01/21/1988   Fx lower humerus-closed 10/22/1997   Gait disorder 07/05/2016   Graves' disease 07/16/2021   HCAP (healthcare-associated pneumonia) 05/09/2014   Hepatitis     Hep A years ago   History of corticosteroid therapy 02/13/2023   History of endocrine disorder 02/13/2023   HNP (herniated nucleus pulposus), cervical 05/06/2014   Hypertension    Hyponatremia 12/02/2021   Hypothyroidism    Graves disease, age 42 yo   Malignant neoplasm of left breast (HCC) 06/05/2020   Nose trouble    right nostril will hemorrhage at times   Osteoporosis 11/04/2020   Other forms of scoliosis, thoracolumbar region 11/03/2016   Pneumonia    Rib pain on left side 09/26/2022   Rosacea    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   C3-4      Past Surgical History:  Procedure Laterality Date   ABDOMINAL HYSTERECTOMY     Age 50   ANTERIOR CERVICAL DECOMP/DISCECTOMY FUSION N/A 05/06/2014   Procedure: ANTERIOR  CERVICAL DISCECTOMY FUSION C3-4 with transgraft, local bone graft, plate and screws;  Surgeon: Kerrin Champagne, MD;  Location: [facility] OR;  Service: Orthopedics;  Laterality: N/A;   APPENDECTOMY     BREAST LUMPECTOMY Left    COLONOSCOPY     EYE SURGERY Bilateral    cataracts   LUMBAR SPINE SURGERY  2011   Removal of hematoma   ORIF HUMERUS FRACTURE Left 2013   THORACIC SPINE SURGERY  2011   Bone cages   THYROIDECTOMY     TONSILLECTOMY      Family History  Problem Relation Age of Onset   Congestive Heart Failure Mother    Heart disease Father    Rheum arthritis Daughter    Tongue cancer Grandchild    Breast cancer Neg Hx     Social History:  reports that she quit smoking about 61 years ago. Her smoking use included cigarettes. She has never used smokeless tobacco. She reports that she does not currently use alcohol. She reports that she does not use drugs.  Allergies:  Allergies  Allergen Reactions   Cefuroxime Hives   Zithromax [Azithromycin] Hives    Medications: I have reviewed the patient's current medications.  Results for orders placed or performed during the hospital encounter of 06/29/23 (from the past 48 hour(s))  Basic metabolic panel     Status: Abnormal   Collection Time: 06/29/23  2:35 PM  Result Value Ref Range   Sodium 135 135 - 145 mmol/L   Potassium 3.6 3.5 - 5.1 mmol/L   Chloride 95 (L) 98 - 111 mmol/L   CO2 28 22 - 32 mmol/L   Glucose, Bld 140 (H) 70 - 99 mg/dL    Comment: Glucose reference range applies only to samples taken after fasting for at least 8 hours.   BUN 15 8 - 23 mg/dL   Creatinine, Ser 4.09 0.44 - 1.00 mg/dL   Calcium 8.9 8.9 - 81.1 mg/dL   GFR, Estimated >91 >47 mL/min    Comment: (NOTE) Calculated using the CKD-EPI Creatinine Equation (2021)    Anion gap 12 5 - 15    Comment: Performed at [facility], [address] [place], [place] [address]  CBC with Differential     Status: Abnormal   Collection Time: 06/29/23   2:35 PM  Result Value Ref Range   WBC 13.1 (H) 4.0 - 10.5 K/uL   RBC 3.99 3.87 - 5.11 MIL/uL   Hemoglobin 12.6 12.0 - 15.0 g/dL   HCT 21.3 08.6 - 57.8 %   MCV 97.7 80.0 - 100.0 fL   MCH 31.6 26.0 - 34.0 pg   MCHC 32.3 30.0 - 36.0 g/dL   RDW 46.9 62.9 - 52.8 %   Platelets 143 (L) 150 - 400 K/uL   nRBC 0.0 0.0 - 0.2 %   Neutrophils Relative % 87 %   Neutro Abs 11.4 (H) 1.7 - 7.7 K/uL   Lymphocytes Relative 7 %   Lymphs Abs 0.9 0.7 - 4.0 K/uL   Monocytes Relative 6 %   Monocytes Absolute 0.8 0.1 - 1.0 K/uL   Eosinophils Relative 0 %   Eosinophils Absolute 0.0 0.0 - 0.5 K/uL   Basophils Relative 0 %   Basophils Absolute 0.0 0.0 - 0.1 K/uL   Immature Granulocytes 0 %   Abs Immature Granulocytes 0.04 0.00 - 0.07 K/uL    Comment: Performed at [facility], [address] [place], [place] [address]  Basic metabolic panel     Status: Abnormal   Collection Time: 06/30/23  6:10 AM  Result Value Ref Range   Sodium 131 (L) 135 - 145 mmol/L   Potassium 4.1 3.5 - 5.1 mmol/L   Chloride 97 (L) 98 - 111 mmol/L   CO2 27 22 - 32 mmol/L   Glucose, Bld 126 (H) 70 - 99 mg/dL    Comment: Glucose reference range applies only to samples taken after fasting for at least 8 hours.   BUN 11 8 - 23 mg/dL   Creatinine, Ser 4.01 0.44 - 1.00 mg/dL   Calcium 7.9 (L) 8.9 - 10.3 mg/dL   GFR, Estimated >02 >72 mL/min    Comment: (NOTE) Calculated using the CKD-EPI Creatinine Equation (2021)    Anion gap 7 5 - 15    Comment: Performed at [facility], [address] [place], [place] [address]  Magnesium     Status: Abnormal   Collection Time: 06/30/23  6:10 AM  Result Value Ref Range   Magnesium 1.6 (L) 1.7 - 2.4 mg/dL    Comment: Performed at [facility], [address] [place], [place] [address]  Phosphorus     Status: None   Collection Time: 06/30/23  6:10 AM  Result Value Ref Range   Phosphorus 3.2 2.5 - 4.6 mg/dL    Comment: Performed at [facility],  [address] [place], [place] [address]  CBC     Status: Abnormal   Collection Time: 06/30/23  6:10 AM  Result Value Ref Range   WBC 10.9 (H) 4.0 - 10.5 K/uL   RBC 3.69 (L) 3.87 - 5.11 MIL/uL   Hemoglobin 11.6 (L) 12.0 - 15.0 g/dL   HCT 66.0 (L) 63.0 - 16.0 %   MCV 94.6 80.0 - 100.0 fL   MCH 31.4 26.0 - 34.0 pg   MCHC 33.2 30.0 - 36.0 g/dL   RDW 10.9 32.3 - 55.7 %   Platelets 110 (L) 150 - 400 K/uL    Comment: REPEATED TO VERIFY   nRBC 0.0 0.0 - 0.2 %    Comment: Performed at [facility], [address], [place], [place] [address]  Protime-INR     Status: Abnormal   Collection Time: 06/30/23  6:10 AM  Result Value Ref Range   Prothrombin Time 16.9 (H) 11.4 - 15.2 seconds   INR 1.4 (H) 0.8 - 1.2    Comment: (NOTE) INR goal varies based on device and disease states. Performed at [facility], [address] [place], [place] [address]     CT PELVIS WO CONTRAST  Result Date: 06/29/2023 CLINICAL DATA:  Hip fracture EXAM: CT PELVIS WITHOUT CONTRAST TECHNIQUE: Multidetector CT imaging of the pelvis was performed following the standard protocol without intravenous contrast. RADIATION DOSE REDUCTION: This exam was performed according to the departmental dose-optimization program which includes automated exposure control, adjustment of the mA and/or kV according to patient size and/or use of iterative reconstruction technique. COMPARISON:  Standard CT scan 03/08/2023.  X-ray 06/29/2023 earlier. FINDINGS: Urinary Tract:  Preserved contours of the urinary bladder. Bowel: Visualized small and large bowel in the pelvis has a normal course and caliber. Scattered colonic stool. Vascular/Lymphatic: Diffuse vascular calcifications along the aorta and branch vessels. No abnormal lymph node enlargement identified in the visualized pelvis. Reproductive: Uterus is absent. No separate adnexal mass. Numerous surgical clips in the pelvis. Other: There is extraperitoneal right-sided pelvic wall  stranding and thickening with some small hematoma. Musculoskeletal: There is a comminuted fracture involving the right acetabulum with impaction of the femoral head of the hip joint. Fracture lines involve the supra-acetabular region, medial, posterior walls of the acetabula in particular. There is also some protrusio of the femoral head along the fracture line defects medially. Separate fracture of the inferior pubic ramus on the right. The anterior column of the acetabulum is also involve. Degenerative changes of the sacroiliac joints with sclerosis. No additional fracture or dislocation. Degenerative changes of the hip joints. Some hypertrophic changes in the area of the pubic symphysis. Streak artifact from the hardware along the lower lumbar spine at the edge of the imaging field. IMPRESSION: Comminuted fracture involving the acetabulum diffusely with displaced fragments and a component protrusio from the femoral head. Separate fracture of the right inferior pubic ramus. Some extraperitoneal hematoma along the right pelvic sidewall. Degenerative changes.  Osteopenia. Electronically Signed   By: Karen Kays M.D.   On: 06/29/2023 15:15   DG Hip Unilat With Pelvis 2-3 Views Right  Result Date: 06/29/2023 CLINICAL DATA:  Fall.  Right hip pain EXAM: DG HIP (WITH OR WITHOUT PELVIS) 3V RIGHT COMPARISON:  X-ray 10/11/2018 FINDINGS: Progressive joint space loss axially of the hip joints with some osteophytes. Hyperostosis. There is some deformity of the right acetabulum and some lucency with sclerosis involving the right supra-acetabular region which was not seen previously. Is  also deformity along the right acetabulum with some protrusio. A focal bony abnormality is possible recommend further workup with CT when clinically appropriate fixation hardware seen along the lower lumbar spine. Numerous surgical clips in the pelvis. IMPRESSION: Heterogeneous appearance of the right acetabulum and supra-acetabular region,  possible impaction fracture with some protrusio. Further workup with CT when clinically appropriate. Progressive degenerative changes. Electronically Signed   By: Karen Kays M.D.   On: 06/29/2023 13:45    Review of Systems  HENT:  Negative for ear discharge, ear pain, hearing loss and tinnitus.   Eyes:  Negative for photophobia and pain.  Respiratory:  Negative for cough and shortness of breath.   Cardiovascular:  Negative for chest pain.  Gastrointestinal:  Negative for abdominal pain, nausea and vomiting.  Genitourinary:  Negative for dysuria, flank pain, frequency and urgency.  Musculoskeletal:  Positive for arthralgias (Right hip). Negative for back pain, myalgias and neck pain.  Neurological:  Negative for dizziness and headaches.  Hematological:  Does not bruise/bleed easily.  Psychiatric/Behavioral:  The patient is not nervous/anxious.    Blood pressure 113/61, pulse (!) 101, temperature 98 F (36.7 C), temperature source Oral, resp. rate 14, height 5\' 1"  (1.549 m), weight 60 kg, SpO2 98%. Physical Exam Constitutional:      General: She is not in acute distress.    Appearance: She is well-developed. She is not diaphoretic.  HENT:     Head: Normocephalic and atraumatic.  Eyes:     General: No scleral icterus.       Right eye: No discharge.        Left eye: No discharge.     Conjunctiva/sclera: Conjunctivae normal.  Cardiovascular:     Rate and Rhythm: Normal rate and regular rhythm.  Pulmonary:     Effort: Pulmonary effort is normal. No respiratory distress.  Musculoskeletal:     Cervical back: Normal range of motion.     Comments: RLE No traumatic wounds, ecchymosis, or rash  Mild TTP hip  No knee or ankle effusion  Knee stable to varus/ valgus and anterior/posterior stress  Sens DPN, SPN, TN intact  Motor EHL, ext, flex, evers 5/5  DP 1+, PT 1+, No significant edema  Skin:    General: Skin is warm and dry.  Neurological:     Mental Status: She is alert.   Psychiatric:        Mood and Affect: Mood normal.        Behavior: Behavior normal.    Assessment/Plan: Right acetabulum fx -- Plan ORIF Monday with Dr. Jena Gauss. Multiple medical problems including atrial fibrillation on Eliquis, hypertension, stage IV triple positive breast cancer, hypothyroidism -- per primary service. Please continue to hold Eliquis.    Freeman Caldron, PA-C Orthopedic Surgery [phone] 06/30/2023, 9:41 AM

## 2023-06-30 NOTE — Progress Notes (Signed)
Pt's HR has been high running between 120-130's. Asymptomatic. MD was notified. LR fluid has been stopped per MD, new order of Diltiazem was given to pt.   06/30/23 1700  Vitals  Pulse Rate (!) 128  ECG Heart Rate (!) 127  Resp 18  MEWS COLOR  MEWS Score Color Yellow  Oxygen Therapy  SpO2 95 %  MEWS Score  MEWS Temp 0  MEWS Systolic 0  MEWS Pulse 2  MEWS RR 0  MEWS LOC 0  MEWS Score 2

## 2023-07-01 DIAGNOSIS — S32401D Unspecified fracture of right acetabulum, subsequent encounter for fracture with routine healing: Secondary | ICD-10-CM | POA: Diagnosis not present

## 2023-07-01 DIAGNOSIS — E039 Hypothyroidism, unspecified: Secondary | ICD-10-CM | POA: Diagnosis not present

## 2023-07-01 DIAGNOSIS — I4891 Unspecified atrial fibrillation: Secondary | ICD-10-CM | POA: Diagnosis not present

## 2023-07-01 DIAGNOSIS — I10 Essential (primary) hypertension: Secondary | ICD-10-CM | POA: Diagnosis not present

## 2023-07-01 LAB — COMPREHENSIVE METABOLIC PANEL
ALT: 20 U/L (ref 0–44)
AST: 26 U/L (ref 15–41)
Albumin: 2.7 g/dL — ABNORMAL LOW (ref 3.5–5.0)
Alkaline Phosphatase: 49 U/L (ref 38–126)
Anion gap: 7 (ref 5–15)
BUN: 8 mg/dL (ref 8–23)
CO2: 26 mmol/L (ref 22–32)
Calcium: 8 mg/dL — ABNORMAL LOW (ref 8.9–10.3)
Chloride: 94 mmol/L — ABNORMAL LOW (ref 98–111)
Creatinine, Ser: 0.66 mg/dL (ref 0.44–1.00)
GFR, Estimated: >60 mL/min (ref >60)
Glucose, Bld: 139 mg/dL — ABNORMAL HIGH (ref 70–99)
Potassium: 4 mmol/L (ref 3.5–5.1)
Sodium: 127 mmol/L — ABNORMAL LOW (ref 135–145)
Total Bilirubin: 1 mg/dL (ref <1.2)
Total Protein: 5.6 g/dL — ABNORMAL LOW (ref 6.5–8.1)

## 2023-07-01 LAB — CBC
HCT: 34.7 % — ABNORMAL LOW (ref 36.0–46.0)
Hemoglobin: 11.5 g/dL — ABNORMAL LOW (ref 12.0–15.0)
MCH: 30.8 pg (ref 26.0–34.0)
MCHC: 33.1 g/dL (ref 30.0–36.0)
MCV: 93 fL (ref 80.0–100.0)
Platelets: 98 10*3/uL — ABNORMAL LOW (ref 150–400)
RBC: 3.73 MIL/uL — ABNORMAL LOW (ref 3.87–5.11)
RDW: 12.8 % (ref 11.5–15.5)
WBC: 9.4 10*3/uL (ref 4.0–10.5)
nRBC: 0 % (ref 0.0–0.2)

## 2023-07-01 LAB — SODIUM, URINE, RANDOM: Sodium, Ur: 25 mmol/L

## 2023-07-01 LAB — OSMOLALITY, URINE: Osmolality, Ur: 206 mosm/kg — ABNORMAL LOW (ref 300–900)

## 2023-07-01 LAB — OSMOLALITY: Osmolality: 275 mosm/kg (ref 275–295)

## 2023-07-01 LAB — TSH: TSH: 1.825 u[IU]/mL (ref 0.350–4.500)

## 2023-07-01 MED ORDER — DOCUSATE SODIUM 100 MG PO CAPS
100.0000 mg | ORAL_CAPSULE | Freq: Every day | ORAL | Status: DC
  Administered 2023-07-01 – 2023-07-05 (×4): 100 mg via ORAL
  Filled 2023-07-01 (×4): qty 1

## 2023-07-01 MED ORDER — METHOCARBAMOL 500 MG PO TABS
500.0000 mg | ORAL_TABLET | Freq: Two times a day (BID) | ORAL | Status: DC
  Administered 2023-07-01 – 2023-07-03 (×4): 500 mg via ORAL
  Filled 2023-07-01 (×4): qty 1

## 2023-07-01 MED ORDER — DIAZEPAM 5 MG PO TABS
10.0000 mg | ORAL_TABLET | Freq: Every day | ORAL | Status: DC
  Administered 2023-07-01 – 2023-07-05 (×5): 10 mg via ORAL
  Filled 2023-07-01 (×5): qty 2

## 2023-07-01 MED ORDER — METOPROLOL SUCCINATE ER 50 MG PO TB24
50.0000 mg | ORAL_TABLET | Freq: Every day | ORAL | Status: DC
  Administered 2023-07-01 – 2023-07-05 (×5): 50 mg via ORAL
  Filled 2023-07-01 (×5): qty 1

## 2023-07-01 MED ORDER — DOXYCYCLINE HYCLATE 50 MG PO CAPS
50.0000 mg | ORAL_CAPSULE | Freq: Every day | ORAL | Status: DC
  Administered 2023-07-01 – 2023-07-06 (×5): 50 mg via ORAL
  Filled 2023-07-01 (×6): qty 1

## 2023-07-01 MED ORDER — TAMSULOSIN HCL 0.4 MG PO CAPS
0.4000 mg | ORAL_CAPSULE | Freq: Every evening | ORAL | Status: DC
  Administered 2023-07-01 – 2023-07-05 (×5): 0.4 mg via ORAL
  Filled 2023-07-01 (×5): qty 1

## 2023-07-01 MED ORDER — BISACODYL 10 MG RE SUPP
10.0000 mg | Freq: Once | RECTAL | Status: AC
  Administered 2023-07-02: 10 mg via RECTAL
  Filled 2023-07-01: qty 1

## 2023-07-01 MED ORDER — SENNOSIDES-DOCUSATE SODIUM 8.6-50 MG PO TABS
3.0000 | ORAL_TABLET | Freq: Every day | ORAL | Status: DC
  Administered 2023-07-01 – 2023-07-04 (×3): 3 via ORAL
  Filled 2023-07-01 (×3): qty 3

## 2023-07-01 MED ORDER — FUROSEMIDE 20 MG PO TABS
20.0000 mg | ORAL_TABLET | Freq: Once | ORAL | Status: AC
  Administered 2023-07-01: 20 mg via ORAL
  Filled 2023-07-01: qty 1

## 2023-07-01 NOTE — Plan of Care (Signed)
  Problem: Clinical Measurements: Goal: Cardiovascular complication will be avoided Outcome: Progressing   Problem: Activity: Goal: Risk for activity intolerance will decrease Outcome: Progressing   Problem: Pain Management: Goal: General experience of comfort will improve Outcome: Progressing

## 2023-07-01 NOTE — TOC CAGE-AID Note (Signed)
Transition of Care [facility]) - CAGE-AID Screening   Patient Details  Name: Karen Allison MRN: [id number] Date of Birth: 19-Jan-1946   Hewitt Shorts, RN Trauma Response Nurse Phone Number: [phone] 07/01/2023, 10:40 AM    CAGE-AID Screening:    Have You Ever Felt You Ought to Cut Down on Your Drinking or Drug Use?: No Have People Annoyed You By [employer] Your Drinking Or Drug Use?: No Have You Felt Bad Or Guilty About Your Drinking Or Drug Use?: No Have You Ever Had a Drink or Used Drugs First Thing In The Morning to Steady Your Nerves or to Get Rid of a Hangover?: No CAGE-AID Score: 0  Substance Abuse Education Offered: No (Denies alcohol or drug use)

## 2023-07-01 NOTE — Progress Notes (Signed)
[facility]  PROGRESS NOTE  Karen Allison SWF:[id number] DOB: [birth year] DOA: 06/29/2023 PCP: Philemon Kingdom, MD   Brief HPI:   77 y.o. female with medical history significant of atrial fibrillation on Eliquis, hypertension, stage IV triple positive breast cancer, hypothyroidism presenting after mechanical fall.  She states that she was in her usual state of health until this morning.  She made herself some coffee and wore her slippers and as she was walking she tripped on herself and fell towards her right side. Patient does have a history of atrial fibrillation and is currently taking Eliquis 2.5 mg twice daily. Her last dose was on the night of 11/6.  Orthopedics was consulted    Assessment/Plan:   Right acetabulum fracture -Orthopedics consulted; plan for ORIF on Monday -Eliquis on hold for surgery  Atrial fibrillation with RVR -Went into A-fib with RVR last night -Given extra dose of metoprolol IV -Continue metoprolol 25 mg p.o. daily -Anticoagulation with Eliquis on hold -Also started on Cardizem 30 mg p.o. every 8 hours for rate control  Hypertension -Blood pressure is stable -Continue Toprol XL, Cardizem  ?  HFpEF -Last echo from August showed moderate pulmonary hypertension -EF was 55 to 60% -Patient takes Lasix 20 mg intermittently at home -Will give 1 dose of Lasix 20 mg p.o. x 1 -Follow intake and output  Hypothyroidism -Continue Synthroid 50 mcg daily -Check TSH  GERD -Continue p.o. Protonix  Hyponatremia -Sodium is 127 -Check serum and urine osmolality, urine sodium  Stage IV breast cancer with mets to liver and lung -She currently is on trastuzumab every 3 weeks and continues to have her disease under control.  -Followed by oncology as outpatient   Medications     diltiazem  30 mg Oral Q8H   gabapentin  600 mg Oral BID   levothyroxine  50 mcg Oral Q0600   metoprolol succinate  25 mg Oral QHS   pantoprazole  40 mg Oral Daily    [START ON 07/02/2023] rosuvastatin  10 mg Oral Q Sun     Data Reviewed:   CBG:  No results for input(s): "GLUCAP" in the last 168 hours.  SpO2: 99 % O2 Flow Rate (L/min): 2 L/min    Vitals:   07/01/23 0342 07/01/23 0813 07/01/23 0847 07/01/23 0900  BP: 115/75 131/82    Pulse: 96 (!) 106 (!) 114 (!) 106  Resp:  15 (!) 22 16  Temp: 98 F (36.7 C) 97.7 F (36.5 C)    TempSrc: Oral Oral    SpO2: 99% 97% 99% 99%  Weight:      Height:          Data Reviewed:  Basic Metabolic Panel: Recent Labs  Lab 06/29/23 1435 06/30/23 0610 07/01/23 0545  NA 135 131* 127*  K 3.6 4.1 4.0  CL 95* 97* 94*  CO2 28 27 26   GLUCOSE 140* 126* 139*  BUN 15 11 8   CREATININE 0.67 0.68 0.66  CALCIUM 8.9 7.9* 8.0*  MG  --  1.6*  --   PHOS  --  3.2  --     CBC: Recent Labs  Lab 06/29/23 1435 06/30/23 0610 07/01/23 0545  WBC 13.1* 10.9* 9.4  NEUTROABS 11.4*  --   --   HGB 12.6 11.6* 11.5*  HCT 39.0 34.9* 34.7*  MCV 97.7 94.6 93.0  PLT 143* 110* 98*    LFT Recent Labs  Lab 07/01/23 0545  AST 26  ALT 20  ALKPHOS 49  BILITOT 1.0  PROT 5.6*  ALBUMIN 2.7*     Antibiotics: Anti-infectives (From admission, onward)    None        DVT prophylaxis: Eliquis on hold  Code Status: Full code  Family Communication: Discussed with patient's daughter at bedside   CONSULTS orthopedics   Subjective   Denies any complaints.   Objective    Physical Examination:  General-appears in no acute distress Heart-S1-S2, irregular Lungs-clear to auscultation bilaterally, no wheezing or crackles auscultated Abdomen-soft, nontender, no organomegaly Extremities-no edema in the lower extremities Neuro-alert, oriented x3, no focal deficit noted  Status is: Inpatient:             Meredeth Ide   [facility] If 7PM-7AM, please contact night-coverage at [email or web address], Office  [phone]   07/01/2023, 9:28 AM  LOS: 2 days

## 2023-07-02 DIAGNOSIS — I10 Essential (primary) hypertension: Secondary | ICD-10-CM | POA: Diagnosis not present

## 2023-07-02 DIAGNOSIS — S32401D Unspecified fracture of right acetabulum, subsequent encounter for fracture with routine healing: Secondary | ICD-10-CM | POA: Diagnosis not present

## 2023-07-02 DIAGNOSIS — I4891 Unspecified atrial fibrillation: Secondary | ICD-10-CM | POA: Diagnosis not present

## 2023-07-02 DIAGNOSIS — E039 Hypothyroidism, unspecified: Secondary | ICD-10-CM | POA: Diagnosis not present

## 2023-07-02 LAB — BASIC METABOLIC PANEL
Anion gap: 10 (ref 5–15)
BUN: 10 mg/dL (ref 8–23)
CO2: 24 mmol/L (ref 22–32)
Calcium: 8 mg/dL — ABNORMAL LOW (ref 8.9–10.3)
Chloride: 94 mmol/L — ABNORMAL LOW (ref 98–111)
Creatinine, Ser: 0.58 mg/dL (ref 0.44–1.00)
GFR, Estimated: >60 mL/min (ref >60)
Glucose, Bld: 128 mg/dL — ABNORMAL HIGH (ref 70–99)
Potassium: 4 mmol/L (ref 3.5–5.1)
Sodium: 128 mmol/L — ABNORMAL LOW (ref 135–145)

## 2023-07-02 MED ORDER — ENOXAPARIN SODIUM 40 MG/0.4ML IJ SOSY
40.0000 mg | PREFILLED_SYRINGE | Freq: Once | INTRAMUSCULAR | Status: AC
  Administered 2023-07-02: 40 mg via SUBCUTANEOUS
  Filled 2023-07-02: qty 0.4

## 2023-07-02 MED ORDER — LEVOFLOXACIN IN D5W 500 MG/100ML IV SOLN
500.0000 mg | INTRAVENOUS | Status: AC
  Administered 2023-07-03: 500 mg via INTRAVENOUS
  Filled 2023-07-02: qty 100

## 2023-07-02 MED ORDER — TRANEXAMIC ACID-NACL 1000-0.7 MG/100ML-% IV SOLN
1000.0000 mg | INTRAVENOUS | Status: AC
  Administered 2023-07-03: 1000 mg via INTRAVENOUS
  Filled 2023-07-02: qty 100

## 2023-07-02 MED ORDER — VANCOMYCIN HCL IN DEXTROSE 1-5 GM/200ML-% IV SOLN
1000.0000 mg | INTRAVENOUS | Status: AC
  Administered 2023-07-03: 1000 mg via INTRAVENOUS
  Filled 2023-07-02: qty 200

## 2023-07-02 MED ORDER — POVIDONE-IODINE 10 % EX SWAB: 2.0000 | Freq: Once | CUTANEOUS | Status: DC

## 2023-07-02 MED ORDER — BISACODYL 10 MG RE SUPP
10.0000 mg | Freq: Once | RECTAL | Status: AC
  Administered 2023-07-02: 10 mg via RECTAL
  Filled 2023-07-02: qty 1

## 2023-07-02 MED ORDER — CHLORHEXIDINE GLUCONATE 4 % EX SOLN
60.0000 mL | Freq: Once | CUTANEOUS | Status: AC
  Administered 2023-07-03: 4 via TOPICAL
  Filled 2023-07-02: qty 60

## 2023-07-02 MED ORDER — DILTIAZEM HCL 30 MG PO TABS: 30.0000 mg | ORAL_TABLET | Freq: Four times a day (QID) | ORAL | Status: DC | PRN

## 2023-07-02 MED ORDER — FUROSEMIDE 20 MG PO TABS
20.0000 mg | ORAL_TABLET | Freq: Once | ORAL | Status: AC
  Administered 2023-07-02: 20 mg via ORAL
  Filled 2023-07-02: qty 1

## 2023-07-02 NOTE — Progress Notes (Signed)
   ORTHOPAEDIC PROGRESS NOTE  Right acetabulum fracture- ORIF planned 11/11 with Dr. Jena Gauss   SUBJECTIVE: Very pleasant today and doing well. Not in much pain. She has some questions regarding upcoming surgery. States she would like [facility]   OBJECTIVE: PE:  Vitals:   07/02/23 0457 07/02/23 0814  BP: 131/82 112/80  Pulse: (!) 110 (!) 102  Resp: 16 18  Temp:  97.7 F (36.5 C)  SpO2: 97% 99%   Gen: Pleasant elderly woman laying in bed. NAD CV: sinus on monitor Pulm: breathing comfortably on RA Neuro: AxO x 4 MSK - RLE: minimal ttp at lateral and anterior right hip. NVI distally. ROM of ankle, toes preserved - LLE: freely moves without difficulty   ASSESSMENT: Karen Allison is a 77 y.o. female doing well postoperatively.  PLAN: Right ORIF acetabulum 11/11 NPO at Midnight Please so formal op note for weight bearing status, dressing changes, and pain mgmt Likely to resume eliquis PO for dvt ppx, although given recent fall, discussion regarding safety should be had, although she is hypercoagulable 2/2 active metastatic breast cancer   Dispo: likely Acute rehab vs SNF pending PT/OT eval per patient request   Contact information:  please see [facility] for sports med on call after hours  Roswell Nickel, PA-C

## 2023-07-02 NOTE — Plan of Care (Signed)
  Problem: Education: Goal: Knowledge of General Education information will improve Description: Including pain rating scale, medication(s)/side effects and non-pharmacologic comfort measures Outcome: Progressing   Problem: Elimination: Goal: Will not experience complications related to bowel motility Outcome: Progressing   Problem: Pain Management: Goal: General experience of comfort will improve Outcome: Progressing   Problem: Safety: Goal: Ability to remain free from injury will improve Outcome: Progressing

## 2023-07-02 NOTE — Progress Notes (Signed)
[facility]  PROGRESS NOTE  Brishae Lancaster WUJ:[id number] DOB: [birth year] DOA: 06/29/2023 PCP: Philemon Kingdom, MD   Brief HPI:   77 y.o. female with medical history significant of atrial fibrillation on Eliquis, hypertension, stage IV triple positive breast cancer, hypothyroidism presenting after mechanical fall.  She states that she was in her usual state of health until this morning.  She made herself some coffee and wore her slippers and as she was walking she tripped on herself and fell towards her right side. Patient does have a history of atrial fibrillation and is currently taking Eliquis 2.5 mg twice daily. Her last dose was on the night of 11/6.  Orthopedics was consulted    Assessment/Plan:   Right acetabulum fracture -Orthopedics consulted; plan for ORIF on Monday -Eliquis on hold for surgery -DVT prophylaxis per orthopedics, will give 1 dose of Lovenox 40 mg subcutaneously x 1 today only -Bilateral SCDs  Atrial fibrillation with RVR -Patient went into A-fib with RVR -She takes metoprolol 25 mg daily at home, now dose increased to 50 mg daily -Also started on Cardizem 30 mg p.o. every 8 hours, however patient refused Cardizem -Will switch Cardizem to 30 mg p.o. every 6 hours as needed for heart rate greater than 120 -Anticoagulation with Eliquis on hold for surgery on Monday  Hypertension -Blood pressure is stable -Continue Toprol XL, Cardizem  ?  HFpEF -Last echo from August showed moderate pulmonary hypertension -EF was 55 to 60% -Patient takes Lasix 20 mg intermittently at home -Diuresed well with p.o. Lasix 20 mg given yesterday -Will again give Lasix 20 mg p.o. x 1 -Follow intake and output  Hypothyroidism -Continue Synthroid 50 mcg daily -Check TSH  GERD -Continue p.o. Protonix  Hyponatremia -Sodium is 128.  Likely hypervolemic hyponatremia -Started on Lasix as above -Urine osmolality 206 -Serum osmolality 275   Stage IV breast  cancer with mets to liver and lung -She currently is on trastuzumab every 3 weeks and continues to have her disease under control.  -Followed by oncology as outpatient   Medications     diazepam  10 mg Oral QHS   diltiazem  30 mg Oral Q8H   docusate sodium  100 mg Oral Daily   doxycycline  50 mg Oral Daily   gabapentin  600 mg Oral BID   levothyroxine  50 mcg Oral Q0600   methocarbamol  500 mg Oral BID   metoprolol succinate  50 mg Oral QHS   pantoprazole  40 mg Oral Daily   rosuvastatin  10 mg Oral Q Sun   senna-docusate  3 tablet Oral QHS   tamsulosin  0.4 mg Oral QPM     Data Reviewed:   CBG:  No results for input(s): "GLUCAP" in the last 168 hours.  SpO2: 99 % O2 Flow Rate (L/min): 2 L/min    Vitals:   07/02/23 0400 07/02/23 0457 07/02/23 0712 07/02/23 0814  BP:  131/82  112/80  Pulse: 98 (!) 110  (!) 102  Resp: 18 16  18   Temp:    97.7 F (36.5 C)  TempSrc:      SpO2: 96% 97%  99%  Weight:   58.8 kg   Height:          Data Reviewed:  Basic Metabolic Panel: Recent Labs  Lab 06/29/23 1435 06/30/23 0610 07/01/23 0545 07/02/23 0542  NA 135 131* 127* 128*  K 3.6 4.1 4.0 4.0  CL 95* 97* 94* 94*  CO2 28 27 26  24  GLUCOSE 140* 126* 139* 128*  BUN 15 11 8 10   CREATININE 0.67 0.68 0.66 0.58  CALCIUM 8.9 7.9* 8.0* 8.0*  MG  --  1.6*  --   --   PHOS  --  3.2  --   --     CBC: Recent Labs  Lab 06/29/23 1435 06/30/23 0610 07/01/23 0545  WBC 13.1* 10.9* 9.4  NEUTROABS 11.4*  --   --   HGB 12.6 11.6* 11.5*  HCT 39.0 34.9* 34.7*  MCV 97.7 94.6 93.0  PLT 143* 110* 98*    LFT Recent Labs  Lab 07/01/23 0545  AST 26  ALT 20  ALKPHOS 49  BILITOT 1.0  PROT 5.6*  ALBUMIN 2.7*     Antibiotics: Anti-infectives (From admission, onward)    Start     Dose/Rate Route Frequency Ordered Stop   07/01/23 1030  doxycycline (VIBRAMYCIN) 50 MG capsule 50 mg        50 mg Oral Daily 07/01/23 0941          DVT prophylaxis: Eliquis on hold  Code  Status: Full code  Family Communication: Discussed with patient's daughter at bedside   CONSULTS orthopedics   Subjective    Heart rate went up to 160 last night, took 1 dose of Cardizem 30 mg.  Objective    Physical Examination:  General-appears in no acute distress Heart-S1-S2, irregular, no murmur auscultated Lungs-clear to auscultation bilaterally, no wheezing or crackles auscultated Abdomen-soft, nontender, no organomegaly Extremities-no edema in the lower extremities Neuro-alert, oriented x3, no focal deficit noted  Status is: Inpatient:             Meredeth Ide   [facility] If 7PM-7AM, please contact night-coverage at [email or web address], Office  [phone]   07/02/2023, 9:50 AM  LOS: 3 days

## 2023-07-03 ENCOUNTER — Inpatient Hospital Stay (HOSPITAL_COMMUNITY): Payer: Medicare Other | Admitting: Anesthesiology

## 2023-07-03 ENCOUNTER — Encounter (HOSPITAL_COMMUNITY)
Admission: EM | Disposition: A | Discharge status: Rehabilitation Facility | Payer: Self-pay | Source: Home / Self Care | Attending: Family Medicine

## 2023-07-03 ENCOUNTER — Other Ambulatory Visit: Payer: Self-pay

## 2023-07-03 ENCOUNTER — Inpatient Hospital Stay (HOSPITAL_COMMUNITY): Payer: Medicare Other

## 2023-07-03 DIAGNOSIS — I10 Essential (primary) hypertension: Secondary | ICD-10-CM | POA: Diagnosis not present

## 2023-07-03 DIAGNOSIS — I4891 Unspecified atrial fibrillation: Secondary | ICD-10-CM

## 2023-07-03 DIAGNOSIS — E039 Hypothyroidism, unspecified: Secondary | ICD-10-CM | POA: Diagnosis not present

## 2023-07-03 DIAGNOSIS — S32401A Unspecified fracture of right acetabulum, initial encounter for closed fracture: Secondary | ICD-10-CM

## 2023-07-03 DIAGNOSIS — S32401D Unspecified fracture of right acetabulum, subsequent encounter for fracture with routine healing: Secondary | ICD-10-CM | POA: Diagnosis not present

## 2023-07-03 DIAGNOSIS — M9665 Fracture of pelvis following insertion of orthopedic implant, joint prosthesis, or bone plate: Secondary | ICD-10-CM | POA: Insufficient documentation

## 2023-07-03 HISTORY — DX: Fracture of pelvis following insertion of orthopedic implant, joint prosthesis, or bone plate: M96.65

## 2023-07-03 HISTORY — PX: OPEN REDUCTION INTERNAL FIXATION ACETABULAR FRACTURE STOPPA: SHX6831

## 2023-07-03 LAB — VITAMIN D 25 HYDROXY (VIT D DEFICIENCY, FRACTURES): Vit D, 25-Hydroxy: 33.22 ng/mL (ref 30–100)

## 2023-07-03 SURGERY — OPEN REDUCTION INTERNAL FIXATION ACETABULAR FRACTURE STOPPA
Anesthesia: General | Site: Pelvis | Laterality: Right

## 2023-07-03 MED ORDER — METOCLOPRAMIDE HCL 5 MG PO TABS: 5.0000 mg | ORAL_TABLET | Freq: Three times a day (TID) | ORAL | Status: DC | PRN

## 2023-07-03 MED ORDER — DEXMEDETOMIDINE HCL IN NACL 80 MCG/20ML IV SOLN
INTRAVENOUS | Status: DC | PRN
  Administered 2023-07-03: 12 ug via INTRAVENOUS

## 2023-07-03 MED ORDER — PROPOFOL 10 MG/ML IV BOLUS
INTRAVENOUS | Status: DC | PRN
  Administered 2023-07-03: 110 mg via INTRAVENOUS

## 2023-07-03 MED ORDER — EPHEDRINE SULFATE-NACL 50-0.9 MG/10ML-% IV SOSY
PREFILLED_SYRINGE | INTRAVENOUS | Status: DC | PRN
  Administered 2023-07-03 (×2): 5 mg via INTRAVENOUS
  Administered 2023-07-03: 10 mg via INTRAVENOUS

## 2023-07-03 MED ORDER — VANCOMYCIN HCL IN DEXTROSE 1-5 GM/200ML-% IV SOLN
1000.0000 mg | Freq: Two times a day (BID) | INTRAVENOUS | Status: AC
Start: 2023-07-03 — End: 2023-07-03
  Administered 2023-07-03: 1000 mg via INTRAVENOUS
  Filled 2023-07-03: qty 200

## 2023-07-03 MED ORDER — AMISULPRIDE (ANTIEMETIC) 5 MG/2ML IV SOLN: 10.0000 mg | Freq: Once | INTRAVENOUS | Status: DC | PRN

## 2023-07-03 MED ORDER — DEXAMETHASONE SODIUM PHOSPHATE 10 MG/ML IJ SOLN
INTRAMUSCULAR | Status: DC | PRN
  Administered 2023-07-03: 10 mg via INTRAVENOUS

## 2023-07-03 MED ORDER — LIDOCAINE 2% (20 MG/ML) 5 ML SYRINGE
INTRAMUSCULAR | Status: DC | PRN
  Administered 2023-07-03: 80 mg via INTRAVENOUS

## 2023-07-03 MED ORDER — VANCOMYCIN HCL 1 G IV SOLR
INTRAVENOUS | Status: DC | PRN
  Administered 2023-07-03: 1000 mg via TOPICAL

## 2023-07-03 MED ORDER — PROPOFOL 10 MG/ML IV BOLUS
INTRAVENOUS | Status: AC
  Filled 2023-07-03: qty 20

## 2023-07-03 MED ORDER — ONDANSETRON HCL 4 MG PO TABS: 4.0000 mg | ORAL_TABLET | Freq: Four times a day (QID) | ORAL | Status: DC | PRN

## 2023-07-03 MED ORDER — TRANEXAMIC ACID-NACL 1000-0.7 MG/100ML-% IV SOLN
1000.0000 mg | Freq: Once | INTRAVENOUS | Status: AC
  Administered 2023-07-03: 1000 mg via INTRAVENOUS
  Filled 2023-07-03: qty 100

## 2023-07-03 MED ORDER — METOPROLOL TARTRATE 5 MG/5ML IV SOLN
INTRAVENOUS | Status: AC
  Filled 2023-07-03: qty 5

## 2023-07-03 MED ORDER — PHENYLEPHRINE 80 MCG/ML (10ML) SYRINGE FOR IV PUSH (FOR BLOOD PRESSURE SUPPORT)
PREFILLED_SYRINGE | INTRAVENOUS | Status: DC | PRN
  Administered 2023-07-03: 160 ug via INTRAVENOUS
  Administered 2023-07-03: 80 ug via INTRAVENOUS
  Administered 2023-07-03: 160 ug via INTRAVENOUS
  Administered 2023-07-03: 80 ug via INTRAVENOUS
  Administered 2023-07-03: 160 ug via INTRAVENOUS
  Administered 2023-07-03: 80 ug via INTRAVENOUS
  Administered 2023-07-03: 160 ug via INTRAVENOUS

## 2023-07-03 MED ORDER — OXYCODONE HCL 5 MG/5ML PO SOLN
5.0000 mg | Freq: Once | ORAL | Status: AC | PRN
  Administered 2023-07-03: 5 mg via ORAL

## 2023-07-03 MED ORDER — EPHEDRINE 5 MG/ML INJ
INTRAVENOUS | Status: AC
  Filled 2023-07-03: qty 5

## 2023-07-03 MED ORDER — PHENYLEPHRINE HCL-NACL 20-0.9 MG/250ML-% IV SOLN
INTRAVENOUS | Status: DC | PRN
  Administered 2023-07-03: 40 ug/min via INTRAVENOUS

## 2023-07-03 MED ORDER — METOCLOPRAMIDE HCL 5 MG/ML IJ SOLN: 5.0000 mg | Freq: Three times a day (TID) | INTRAMUSCULAR | Status: DC | PRN

## 2023-07-03 MED ORDER — ORAL CARE MOUTH RINSE: 15.0000 mL | Freq: Once | OROMUCOSAL | Status: AC

## 2023-07-03 MED ORDER — HYDROMORPHONE HCL 1 MG/ML IJ SOLN
0.5000 mg | INTRAMUSCULAR | Status: DC | PRN
  Filled 2023-07-03 (×2): qty 0.5

## 2023-07-03 MED ORDER — LACTATED RINGERS IV SOLN: INTRAVENOUS | Status: DC

## 2023-07-03 MED ORDER — VANCOMYCIN HCL 1000 MG IV SOLR
INTRAVENOUS | Status: AC
  Filled 2023-07-03: qty 20

## 2023-07-03 MED ORDER — FENTANYL CITRATE (PF) 250 MCG/5ML IJ SOLN
INTRAMUSCULAR | Status: DC | PRN
  Administered 2023-07-03: 100 ug via INTRAVENOUS
  Administered 2023-07-03: 50 ug via INTRAVENOUS

## 2023-07-03 MED ORDER — ONDANSETRON HCL 4 MG/2ML IJ SOLN: 4.0000 mg | Freq: Four times a day (QID) | INTRAMUSCULAR | Status: DC | PRN

## 2023-07-03 MED ORDER — MIDAZOLAM HCL 2 MG/2ML IJ SOLN
INTRAMUSCULAR | Status: AC
Start: 2023-07-03 — End: ?
  Filled 2023-07-03: qty 2

## 2023-07-03 MED ORDER — OXYCODONE HCL 5 MG PO TABS: 5.0000 mg | ORAL_TABLET | Freq: Once | ORAL | Status: AC | PRN

## 2023-07-03 MED ORDER — CHLORHEXIDINE GLUCONATE 0.12 % MT SOLN: 15.0000 mL | Freq: Once | OROMUCOSAL | Status: AC

## 2023-07-03 MED ORDER — HYDROMORPHONE HCL 1 MG/ML IJ SOLN
INTRAMUSCULAR | Status: DC | PRN
  Administered 2023-07-03: .5 mg via INTRAVENOUS

## 2023-07-03 MED ORDER — CHLORHEXIDINE GLUCONATE CLOTH 2 % EX PADS
6.0000 | MEDICATED_PAD | Freq: Every day | CUTANEOUS | Status: DC
  Administered 2023-07-04 – 2023-07-06 (×3): 6 via TOPICAL

## 2023-07-03 MED ORDER — SODIUM CHLORIDE 0.9 % IV SOLN: 12.5000 mg | INTRAVENOUS | Status: DC | PRN

## 2023-07-03 MED ORDER — HYDROMORPHONE HCL 1 MG/ML IJ SOLN
INTRAMUSCULAR | Status: AC
  Filled 2023-07-03: qty 0.5

## 2023-07-03 MED ORDER — HYDROMORPHONE HCL 1 MG/ML IJ SOLN
INTRAMUSCULAR | Status: AC
  Administered 2023-07-03: 0.5 mg via INTRAVENOUS
  Filled 2023-07-03: qty 1

## 2023-07-03 MED ORDER — ALBUMIN HUMAN 5 % IV SOLN: INTRAVENOUS | Status: DC | PRN

## 2023-07-03 MED ORDER — FENTANYL CITRATE (PF) 250 MCG/5ML IJ SOLN
INTRAMUSCULAR | Status: AC
  Filled 2023-07-03: qty 5

## 2023-07-03 MED ORDER — OXYCODONE HCL 5 MG PO TABS: 5.0000 mg | ORAL_TABLET | ORAL | Status: DC | PRN

## 2023-07-03 MED ORDER — ESMOLOL HCL 100 MG/10ML IV SOLN
INTRAVENOUS | Status: AC
  Filled 2023-07-03: qty 10

## 2023-07-03 MED ORDER — HYDROMORPHONE HCL 1 MG/ML IJ SOLN
0.2500 mg | INTRAMUSCULAR | Status: DC | PRN
  Administered 2023-07-03 (×2): 0.5 mg via INTRAVENOUS

## 2023-07-03 MED ORDER — OXYCODONE HCL 5 MG/5ML PO SOLN
ORAL | Status: AC
  Filled 2023-07-03: qty 5

## 2023-07-03 MED ORDER — PROPOFOL 500 MG/50ML IV EMUL
INTRAVENOUS | Status: DC | PRN
  Administered 2023-07-03: 75 ug/kg/min via INTRAVENOUS

## 2023-07-03 MED ORDER — ONDANSETRON HCL 4 MG/2ML IJ SOLN
INTRAMUSCULAR | Status: DC | PRN
  Administered 2023-07-03: 4 mg via INTRAVENOUS

## 2023-07-03 MED ORDER — 0.9 % SODIUM CHLORIDE (POUR BTL) OPTIME
TOPICAL | Status: DC | PRN
  Administered 2023-07-03: 1000 mL

## 2023-07-03 MED ORDER — ROCURONIUM BROMIDE 10 MG/ML (PF) SYRINGE
PREFILLED_SYRINGE | INTRAVENOUS | Status: DC | PRN
  Administered 2023-07-03: 60 mg via INTRAVENOUS

## 2023-07-03 MED ORDER — SUGAMMADEX SODIUM 200 MG/2ML IV SOLN
INTRAVENOUS | Status: DC | PRN
  Administered 2023-07-03: 200 mg via INTRAVENOUS

## 2023-07-03 MED ORDER — CHLORHEXIDINE GLUCONATE 0.12 % MT SOLN
OROMUCOSAL | Status: AC
  Administered 2023-07-03: 15 mL via OROMUCOSAL
  Filled 2023-07-03: qty 15

## 2023-07-03 SURGICAL SUPPLY — 71 items
ADH SKN CLS APL DERMABOND .7 (GAUZE/BANDAGES/DRESSINGS) ×1
APL PRP STRL LF DISP 70% ISPRP (MISCELLANEOUS) ×1
APPLIER CLIP 11 MED OPEN (CLIP) ×1
APR CLP MED 11 20 MLT OPN (CLIP) ×1
BAG COUNTER SPONGE SURGICOUNT (BAG) ×1 IMPLANT
BAG SPNG CNTER NS LX DISP (BAG) ×1
BIT DRILL AO MATTA 2.5MX230M (BIT) IMPLANT
BLADE CLIPPER SURG (BLADE) IMPLANT
BRUSH SCRUB EZ PLAIN DRY (MISCELLANEOUS) ×2 IMPLANT
CHLORAPREP W/TINT 26 (MISCELLANEOUS) ×1 IMPLANT
CLIP APPLIE 11 MED OPEN (CLIP) IMPLANT
COVER SURGICAL LIGHT HANDLE (MISCELLANEOUS) ×1 IMPLANT
DERMABOND ADVANCED .7 DNX12 (GAUZE/BANDAGES/DRESSINGS) IMPLANT
DRAPE C-ARM 42X72 X-RAY (DRAPES) ×1 IMPLANT
DRAPE C-ARMOR (DRAPES) ×1 IMPLANT
DRAPE INCISE IOBAN 66X45 STRL (DRAPES) ×1 IMPLANT
DRAPE SURG ORHT 6 SPLT 77X108 (DRAPES) ×1 IMPLANT
DRAPE U-SHAPE 47X51 STRL (DRAPES) ×1 IMPLANT
DRILL BIT AO MATTA 2.5MX230M (BIT) ×1
DRSG MEPILEX POST OP 4X12 (GAUZE/BANDAGES/DRESSINGS) IMPLANT
DRSG MEPILEX POST OP 4X8 (GAUZE/BANDAGES/DRESSINGS) IMPLANT
ELECT BLADE 6.5 EXT (BLADE) ×1 IMPLANT
ELECT REM PT RETURN 9FT ADLT (ELECTROSURGICAL) ×1
ELECTRODE REM PT RTRN 9FT ADLT (ELECTROSURGICAL) ×1 IMPLANT
GLOVE BIO SURGEON STRL SZ 6.5 (GLOVE) ×3 IMPLANT
GLOVE BIO SURGEON STRL SZ7.5 (GLOVE) ×4 IMPLANT
GLOVE BIOGEL PI IND STRL 6.5 (GLOVE) ×1 IMPLANT
GLOVE BIOGEL PI IND STRL 7.5 (GLOVE) ×1 IMPLANT
GOWN STRL REUS W/ TWL LRG LVL3 (GOWN DISPOSABLE) ×3 IMPLANT
GOWN STRL REUS W/TWL LRG LVL3 (GOWN DISPOSABLE) ×3
HANDPIECE INTERPULSE COAX TIP (DISPOSABLE)
K-WIRE 3.2X150 (WIRE) ×1
KIT BASIN OR (CUSTOM PROCEDURE TRAY) ×1 IMPLANT
KIT TURNOVER KIT B (KITS) ×1 IMPLANT
KWIRE 3.2X150 (WIRE) IMPLANT
MANIFOLD NEPTUNE II (INSTRUMENTS) ×1 IMPLANT
NS IRRIG 1000ML POUR BTL (IV SOLUTION) ×1 IMPLANT
PACK TOTAL JOINT (CUSTOM PROCEDURE TRAY) ×1 IMPLANT
PACK UNIVERSAL I (CUSTOM PROCEDURE TRAY) ×1 IMPLANT
PAD ARMBOARD 7.5X6 YLW CONV (MISCELLANEOUS) ×2 IMPLANT
PLATE SUPRAPECTINEAL PELVIS (Plate) IMPLANT
RETRIEVER SUT HEWSON (MISCELLANEOUS) IMPLANT
SCREW CORTEX ST MATTA 3.5X24 (Screw) IMPLANT
SCREW CORTEX ST MATTA 3.5X30MM (Screw) IMPLANT
SCREW CORTEX ST MATTA 3.5X32MM (Screw) IMPLANT
SCREW CORTEX ST MATTA 3.5X34MM (Screw) IMPLANT
SCREW CORTEX ST MATTA 3.5X36MM (Screw) IMPLANT
SCREW CORTEX ST MATTA 3.5X40MM (Screw) IMPLANT
SCREW CORTEX ST MATTA 3.5X45MM (Screw) IMPLANT
SCREW CORTEX ST MATTA 3.5X50MM (Screw) IMPLANT
SCREW CORTEX ST MATTA 3.5X60MM (Screw) IMPLANT
SET HNDPC FAN SPRY TIP SCT (DISPOSABLE) ×1 IMPLANT
SPONGE T-LAP 18X18 ~~LOC~~+RFID (SPONGE) IMPLANT
STAPLER VISISTAT 35W (STAPLE) ×1 IMPLANT
SUCTION TUBE FRAZIER 10FR DISP (SUCTIONS) IMPLANT
SUT FIBERWIRE #2 38 T-5 BLUE (SUTURE)
SUT MNCRL AB 3-0 PS2 18 (SUTURE) ×1 IMPLANT
SUT MON AB 2-0 CT1 36 (SUTURE) ×1 IMPLANT
SUT VIC AB 0 CT1 27 (SUTURE) ×1
SUT VIC AB 0 CT1 27XBRD ANBCTR (SUTURE) ×2 IMPLANT
SUT VIC AB 1 CT1 27 (SUTURE)
SUT VIC AB 1 CT1 27XBRD ANBCTR (SUTURE) ×1 IMPLANT
SUT VIC AB 2-0 CT1 27 (SUTURE)
SUT VIC AB 2-0 CT1 TAPERPNT 27 (SUTURE) ×1 IMPLANT
SUT VIC AB CT1 27XBRD ANBCTRL (SUTURE) ×1
SUTURE FIBERWR #2 38 T-5 BLUE (SUTURE) IMPLANT
TOWEL GREEN STERILE (TOWEL DISPOSABLE) ×2 IMPLANT
TRAY FOLEY MTR SLVR 16FR STAT (SET/KITS/TRAYS/PACK) IMPLANT
TUBE SUCT ARGYLE STRL (TUBING) IMPLANT
WATER STERILE IRR 1000ML POUR (IV SOLUTION) ×1 IMPLANT
YANKAUER SUCT BULB TIP NO VENT (SUCTIONS) IMPLANT

## 2023-07-03 NOTE — Op Note (Signed)
Orthopaedic Surgery Operative Note (CSN: [id number] ) Date of Surgery: 07/03/2023  Admit Date: 06/29/2023   Diagnoses: Pre-Op Diagnoses: Right anterior column/posterior hemi-transverse acetabular fracture  Post-Op Diagnosis: Same  Procedures: CPT 27228-Open reduction internal fixation of right acetabular fracture  Surgeons : Primary: Roby Lofts, MD  Assistant: Ulyses Southward, PA-C  Location: OR 3   Anesthesia: General   Antibiotics: Ancef 2g preop with 1gm vancomycin powder placed topically   Tourniquet time: None    Estimated Blood Loss: 250 mL  Complications: None   Specimens:None   Implants: Implant Name Type Inv. Item Serial No. Manufacturer Lot No. LRB No. Used Action  PLATE SUPRAPECTINEAL PELVIS - [id number] Plate PLATE SUPRAPECTINEAL PELVIS  [employer] [id number] Right 1 Implanted  SCREW CORTEX ST MATTA 3.5X60MM - [id number] Screw SCREW CORTEX ST MATTA 3.5X60MM  [employer]  Right 1 Implanted  SCREW CORTEX ST MATTA 3.5X45MM - [id number] Screw SCREW CORTEX ST MATTA 3.5X45MM  [employer]  Right 1 Implanted  SCREW CORTEX ST MATTA 3.5X36MM - [id number] Screw SCREW CORTEX ST MATTA 3.5X36MM  [employer]  Right 2 Implanted  SCREW CORTEX ST MATTA 3.5X32MM - [id number] Screw SCREW CORTEX ST MATTA 3.5X32MM  [employer]  Right 1 Implanted  SCREW CORTEX ST MATTA 3.5X34MM - [id number] Screw SCREW CORTEX ST MATTA 3.5X34MM  [employer]  Right 1 Implanted  SCREW CORTEX ST MATTA 3.5X24 - [id number] Screw SCREW CORTEX ST MATTA 3.5X24  [employer]  Right 1 Implanted  SCREW CORTEX ST MATTA 3.5X40MM - [id number] Screw SCREW CORTEX ST MATTA 3.5X40MM  [employer] ON TRAY Right 1 Implanted  SCREW CORTEX ST MATTA 3.5X30MM - [id number] Screw SCREW CORTEX ST MATTA 3.5X30MM  [employer] ON TRAY Right 1 Implanted     Indications for Surgery: 77 year old female who sustained a ground-level fall with a right acetabular  fracture with protrusio of her femoral head.  Due to the unstable nature of her injury I recommend proceeding with open reduction internal fixation.  Risks and benefits were discussed with the patient and her granddaughter.  Risks included but not limited to bleeding, infection, malunion, nonunion, hardware failure, hardware rotation, nerve and blood vessel injury, DVT, even the possibility anesthetic complications.  They agreed to proceed with surgery and consent was obtained.  Operative Findings: Open reduction internal fixation of right acetabular fracture using [employer]ker suprapectineal plate through an intrapelvic approach.  Procedure: The patient was identified in the preoperative holding area. Consent was confirmed with the patient and their family and all questions were answered. The operative extremity was marked after confirmation with the patient. she was then brought back to the operating room by our anesthesia colleagues.  She was placed under general anesthetic Foley catheter was placed.  She was then carefully transferred over to radiolucent flattop table.  Fluoroscopic imaging showed the unstable nature of her injury.  The pelvis was then prepped and draped in usual sterile fashion.  A timeout was performed to verify the patient, the procedure, and the extremity.  Preoperative antibiotics were dosed.  A standard Pfannenstiel incision was then made and carried down through skin and subcutaneous tissue.  Identified the split between the right and left rectus fascia and incised this vertically to enter the retropubic space.  I then identified the pubic symphysis.  I then sweep the adherence from the bladder up to the underlying abdominal wall and protect the bladder throughout the case.  I then proceeded to reflect a portion of the anterior rectus off of the superior  pubic ramus to be able to access the acetabulum.  I then carefully dissected along the superior pubic ramus until I reached the pubic  tubercle.  I placed retractor #1 to retract the rectus out of the way.  I continued along the superior pubic ramus to identify the corona mortise used vessel clips to clamp the vessel and then incised through the iliopectineal fascia.  I continued along the brim of the pelvis until I reached the intact ilium and the right sided SI joint.  At this point I then identified the obturator nerve and protected this throughout the case.  I took care to dissect along the quadrilateral plate and posterior column.  At this point I then used a ball spike pusher to be able to reduce the anterior column back down into an anatomic position.  I felt that buttressing the anterior column with the plate would be most appropriate.  I then placed the plate in the pelvis and then I held it provisionally with K wires.  I then drilled and placed nonlocking screw into the superior pubic ramus.  I then used a ball spike pusher to reduce the anterior column and then drilled and placed nonlocking screws into the intact ilium to bring the plate flush to bone.  I continue to work the plate down to further reduce the anterior column of the acetabulum.  I also lateralized the plate to help assist with reduction of the posterior column as well.  I placed a total of 5 screws in the ilium and then 4 screws into the superior pubic ramus.  Excellent fixation was obtained.  Final fluoroscopic imaging was obtained.  The incision was copiously irrigated.  A gram of vancomycin powder was placed into the incision.  A layered closure of 0 Vicryl for the fascia 0 Vicryl 2-0 Monocryl and 3-0 Monocryl for the skin with Dermabond was used to close the skin.  Sterile dressing was applied.  The patient was then awoke from anesthesia and taken to the PACU in stable condition.  Post Op Plan/Instructions: The patient will be touchdown weightbearing to the right lower extremity.  She will receive postoperative antibiotics.  We will restart her Eliquis starting  tomorrow.  Will have her mobilize with physical and Occupational Therapy.  I was present and performed the entire surgery.  Ulyses Southward, PA-C did assist me throughout the case. An assistant was necessary given the difficulty in approach, maintenance of reduction and ability to instrument the fracture.   Truitt Merle, MD [facility]

## 2023-07-03 NOTE — Anesthesia Postprocedure Evaluation (Signed)
Anesthesia Post Note  Patient: Quincey Robbinson  Procedure(s) Performed: OPEN REDUCTION INTERNAL FIXATION ACETABULAR FRACTURE STOPPA (Right: Pelvis)     Patient location during evaluation: PACU Anesthesia Type: General Level of consciousness: awake and alert Pain management: pain level controlled Vital Signs Assessment: post-procedure vital signs reviewed and stable Respiratory status: spontaneous breathing, nonlabored ventilation and respiratory function stable Cardiovascular status: blood pressure returned to baseline and stable Postop Assessment: no apparent nausea or vomiting Anesthetic complications: no   No notable events documented.  Last Vitals:  Vitals:   07/03/23 1100 07/03/23 1115  BP: 109/72 104/67  Pulse: 89 90  Resp: 14 12  Temp:    SpO2: 99% 92%    Last Pain:  Vitals:   07/03/23 1100  TempSrc:   PainSc: 7                  Lowella Curb

## 2023-07-03 NOTE — Progress Notes (Signed)
[facility]  PROGRESS NOTE  Karen Allison AVW:[id number] DOB: [birth year] DOA: 06/29/2023 PCP: Philemon Kingdom, MD   Brief HPI:   77 y.o. female with medical history significant of atrial fibrillation on Eliquis, hypertension, stage IV triple positive breast cancer, hypothyroidism presenting after mechanical fall.  She states that she was in her usual state of health until this morning.  She made herself some coffee and wore her slippers and as she was walking she tripped on herself and fell towards her right side. Patient does have a history of atrial fibrillation and is currently taking Eliquis 2.5 mg twice daily. Her last dose was on the night of 11/6.  Orthopedics was consulted    Assessment/Plan:   Right acetabulum fracture -S/p ORIF right S2 refracture -Orthopedics following -Eliquis on hold for today, plan to restart tomorrow as per orthopedics   Atrial fibrillation with RVR -Patient went into A-fib with RVR -She takes metoprolol 25 mg daily at home, now dose increased to 50 mg daily -Also started on Cardizem 30 mg p.o. every 8 hours, however patient refused Cardizem -Continue  Cardizem  30 mg p.o. every 6 hours as needed for heart rate greater than 120 -Anticoagulation with Eliquis on hold for surgery on Monday; plan to restart Eliquis from 07/04/2023  Hypertension -Blood pressure is stable -Continue Toprol XL, Cardizem  ?  HFpEF -Last echo from August showed moderate pulmonary hypertension -EF was 55 to 60% -Patient takes Lasix 20 mg intermittently at home -Diuresed well with p.o. Lasix 20 mg given yesterday   Hypothyroidism -Continue Synthroid 50 mcg daily -Check TSH  GERD -Continue p.o. Protonix  Hyponatremia -Sodium is 128.  Likely hypervolemic hyponatremia -Started on Lasix as above -Urine osmolality 206 -Serum osmolality 275 -Recheck BMP in a.m.   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  -Followed by oncology as outpatient   Medications     diazepam  10 mg Oral QHS   docusate sodium  100 mg Oral Daily   doxycycline  50 mg Oral Daily   gabapentin  600 mg Oral BID   levothyroxine  50 mcg Oral Q0600   methocarbamol  500 mg Oral BID   metoprolol succinate  50 mg Oral QHS   oxyCODONE       pantoprazole  40 mg Oral Daily   rosuvastatin  10 mg Oral Q Sun   senna-docusate  3 tablet Oral QHS   tamsulosin  0.4 mg Oral QPM     Data Reviewed:   CBG:  No results for input(s): "GLUCAP" in the last 168 hours.  SpO2: 99 % O2 Flow Rate (L/min): 0 L/min    Vitals:   07/03/23 1100 07/03/23 1115 07/03/23 1141 07/03/23 1431  BP: 109/72 104/67 113/60 108/69  Pulse: 89 90 90 98  Resp: 14 12 16 16   Temp:  98.2 F (36.8 C) 98 F (36.7 C) 98 F (36.7 C)  TempSrc:   Oral Oral  SpO2: 99% 92% 98% 99%  Weight:      Height:          Data Reviewed:  Basic Metabolic Panel: Recent Labs  Lab 06/29/23 1435 06/30/23 0610 07/01/23 0545 07/02/23 0542  NA 135 131* 127* 128*  K 3.6 4.1 4.0 4.0  CL 95* 97* 94* 94*  CO2 28 27 26 24   GLUCOSE 140* 126* 139* 128*  BUN 15 11 8  10  CREATININE 0.67 0.68 0.66 0.58  CALCIUM 8.9 7.9* 8.0* 8.0*  MG  --  1.6*  --   --   PHOS  --  3.2  --   --     CBC: Recent Labs  Lab 06/29/23 1435 06/30/23 0610 07/01/23 0545  WBC 13.1* 10.9* 9.4  NEUTROABS 11.4*  --   --   HGB 12.6 11.6* 11.5*  HCT 39.0 34.9* 34.7*  MCV 97.7 94.6 93.0  PLT 143* 110* 98*    LFT Recent Labs  Lab 07/01/23 0545  AST 26  ALT 20  ALKPHOS 49  BILITOT 1.0  PROT 5.6*  ALBUMIN 2.7*     Antibiotics: Anti-infectives (From admission, onward)    Start     Dose/Rate Route Frequency Ordered Stop   07/03/23 2000  vancomycin (VANCOCIN) IVPB 1000 mg/200 mL premix        1,000 mg 200 mL/hr over 60 Minutes Intravenous Every 12 hours 07/03/23 1140 07/04/23 0759   07/03/23 0928  vancomycin (VANCOCIN)  powder  Status:  Discontinued          As needed 07/03/23 0928 07/03/23 0949   07/03/23 0615  levofloxacin (LEVAQUIN) IVPB 500 mg        500 mg 100 mL/hr over 60 Minutes Intravenous On call to O.R. 07/02/23 1953 07/03/23 0859   07/03/23 0515  vancomycin (VANCOCIN) IVPB 1000 mg/200 mL premix        1,000 mg 200 mL/hr over 60 Minutes Intravenous On call to O.R. 07/02/23 1953 07/03/23 0901   07/01/23 1030  doxycycline (VIBRAMYCIN) 50 MG capsule 50 mg        50 mg Oral Daily 07/01/23 0941          DVT prophylaxis: Eliquis on hold  Code Status: Full code  Family Communication: Discussed with patient's daughter at bedside   CONSULTS orthopedics   Subjective    Patient seen after surgery.  Denies pain  Objective    Physical Examination:  Appears in no acute distress Abdomen is soft, nontender, no organomegaly Extremities no edema  Status is: Inpatient:             Meredeth Ide   [facility] If 7PM-7AM, please contact night-coverage at [email or web address], Office  [phone]   07/03/2023, 2:42 PM  LOS: 4 days

## 2023-07-03 NOTE — Anesthesia Procedure Notes (Signed)
Procedure Name: Intubation Date/Time: 07/03/2023 7:56 AM  Performed by: Jimmey Ralph, CRNAPre-anesthesia Checklist: Patient identified, Emergency Drugs available, Suction available and Patient being monitored Patient Re-evaluated:Patient Re-evaluated prior to induction Oxygen Delivery Method: Circle system utilized Preoxygenation: Pre-oxygenation with 100% oxygen Induction Type: IV induction Ventilation: Mask ventilation without difficulty Laryngoscope Size: Glidescope and 3 Grade View: Grade I Tube type: Oral Tube size: 7.0 mm Number of attempts: 1 Airway Equipment and Method: Stylet and Oral airway Placement Confirmation: ETT inserted through vocal cords under direct vision, positive ETCO2 and breath sounds checked- equal and bilateral Secured at: 22 cm Tube secured with: Tape Dental Injury: Teeth and Oropharynx as per pre-operative assessment

## 2023-07-03 NOTE — Interval H&P Note (Signed)
Patient is indicated for open reduction internal fixation of right acetabular fracture through intrapelvic approach.  Went over risks and benefits 1 more time with the patient and her granddaughter.  Risks include but not limited to bleeding, infection, malunion, nonunion, post traumatic arthritis, hip stiffness, nerve and blood vessel injury, DVT, even the possibility anesthetic complications.  She agreed to proceed with surgery and consent was obtained.  Roby Lofts, MD [facility] [phone] (office) [email or web address]

## 2023-07-03 NOTE — Transfer of Care (Signed)
Immediate Anesthesia Transfer of Care Note  Patient: Karen Allison  Procedure(s) Performed: OPEN REDUCTION INTERNAL FIXATION ACETABULAR FRACTURE STOPPA (Right: Pelvis)  Patient Location: PACU  Anesthesia Type:General  Level of Consciousness: awake, alert , and oriented  Airway & Oxygen Therapy: Patient Spontanous Breathing  Post-op Assessment: Report given to RN and Post -op Vital signs reviewed and stable  Post vital signs: Reviewed and stable  Last Vitals:  Vitals Value Taken Time  BP 123/75 07/03/23 1000  Temp    Pulse 103 07/03/23 1002  Resp 11 07/03/23 1002  SpO2 95 % 07/03/23 1002  Vitals shown include unfiled device data.  Last Pain:  Vitals:   07/03/23 0538  TempSrc:   PainSc: 7       Patients Stated Pain Goal: 2 (07/03/23 0538)  Complications: No notable events documented.

## 2023-07-03 NOTE — Plan of Care (Signed)
  Problem: Education: Goal: Knowledge of General Education information will improve Description: Including pain rating scale, medication(s)/side effects and non-pharmacologic comfort measures Outcome: Progressing   Problem: Activity: Goal: Risk for activity intolerance will decrease Outcome: Progressing   Problem: Coping: Goal: Level of anxiety will decrease Outcome: Progressing   

## 2023-07-03 NOTE — Anesthesia Preprocedure Evaluation (Signed)
Anesthesia Evaluation  Patient identified by MRN, date of birth, ID band Patient awake    Reviewed: Allergy & Precautions, H&P , NPO status , Patient's Chart, lab work & pertinent test results  Airway Mallampati: II   Neck ROM: Full    Dental  (+) Dental Advisory Given, Teeth Intact   Pulmonary former smoker   breath sounds clear to auscultation       Cardiovascular hypertension, Pt. on medications + dysrhythmias Atrial Fibrillation  Rhythm:Regular Rate:Normal  ECHO EF 55%10 years ago   Neuro/Psych    GI/Hepatic   Endo/Other  Hypothyroidism    Renal/GU      Musculoskeletal  (+) Arthritis , Osteoarthritis,  Osteoporosis   Abdominal  (+)  Abdomen: soft.   Peds  Hematology   Anesthesia Other Findings Stage IV Breast Cancer  Reproductive/Obstetrics                             Anesthesia Physical Anesthesia Plan  ASA: III  Anesthesia Plan: General   Post-op Pain Management: Tylenol PO (pre-op)* and Gabapentin PO (pre-op)*   Induction: Intravenous  PONV Risk Score and Plan: 3 and Ondansetron, Dexamethasone, Midazolam and Treatment may vary due to age or medical condition  Airway Management Planned: Oral ETT  Additional Equipment:   Intra-op Plan:   Post-operative Plan: Extubation in OR  Informed Consent: I have reviewed the patients History and Physical, chart, labs and discussed the procedure including the risks, benefits and alternatives for the proposed anesthesia with the patient or authorized representative who has indicated his/her understanding and acceptance.       Plan Discussed with:   Anesthesia Plan Comments:         Anesthesia Quick Evaluation

## 2023-07-04 ENCOUNTER — Inpatient Hospital Stay (HOSPITAL_COMMUNITY): Payer: Medicare Other

## 2023-07-04 DIAGNOSIS — I10 Essential (primary) hypertension: Secondary | ICD-10-CM | POA: Diagnosis not present

## 2023-07-04 DIAGNOSIS — E039 Hypothyroidism, unspecified: Secondary | ICD-10-CM | POA: Diagnosis not present

## 2023-07-04 DIAGNOSIS — S32401D Unspecified fracture of right acetabulum, subsequent encounter for fracture with routine healing: Secondary | ICD-10-CM | POA: Diagnosis not present

## 2023-07-04 DIAGNOSIS — I4891 Unspecified atrial fibrillation: Secondary | ICD-10-CM | POA: Diagnosis not present

## 2023-07-04 LAB — CBC
HCT: 29.1 % — ABNORMAL LOW (ref 36.0–46.0)
Hemoglobin: 10 g/dL — ABNORMAL LOW (ref 12.0–15.0)
MCH: 31.6 pg (ref 26.0–34.0)
MCHC: 34.4 g/dL (ref 30.0–36.0)
MCV: 92.1 fL (ref 80.0–100.0)
Platelets: 109 10*3/uL — ABNORMAL LOW (ref 150–400)
RBC: 3.16 MIL/uL — ABNORMAL LOW (ref 3.87–5.11)
RDW: 12.5 % (ref 11.5–15.5)
WBC: 9.4 10*3/uL (ref 4.0–10.5)
nRBC: 0 % (ref 0.0–0.2)

## 2023-07-04 LAB — BASIC METABOLIC PANEL
Anion gap: 8 (ref 5–15)
BUN: 8 mg/dL (ref 8–23)
CO2: 27 mmol/L (ref 22–32)
Calcium: 8.1 mg/dL — ABNORMAL LOW (ref 8.9–10.3)
Chloride: 96 mmol/L — ABNORMAL LOW (ref 98–111)
Creatinine, Ser: 0.6 mg/dL (ref 0.44–1.00)
GFR, Estimated: >60 mL/min (ref >60)
Glucose, Bld: 179 mg/dL — ABNORMAL HIGH (ref 70–99)
Potassium: 3.9 mmol/L (ref 3.5–5.1)
Sodium: 131 mmol/L — ABNORMAL LOW (ref 135–145)

## 2023-07-04 MED ORDER — GABAPENTIN 300 MG PO CAPS
600.0000 mg | ORAL_CAPSULE | Freq: Three times a day (TID) | ORAL | Status: DC
  Administered 2023-07-04 – 2023-07-06 (×7): 600 mg via ORAL
  Filled 2023-07-04 (×7): qty 2

## 2023-07-04 MED ORDER — APIXABAN 2.5 MG PO TABS
2.5000 mg | ORAL_TABLET | Freq: Two times a day (BID) | ORAL | Status: DC
  Administered 2023-07-04 – 2023-07-06 (×5): 2.5 mg via ORAL
  Filled 2023-07-04 (×5): qty 1

## 2023-07-04 MED ORDER — HYDROCODONE-ACETAMINOPHEN 5-325 MG PO TABS
1.0000 | ORAL_TABLET | ORAL | Status: DC | PRN
  Administered 2023-07-04 – 2023-07-05 (×4): 2 via ORAL
  Administered 2023-07-05: 1 via ORAL
  Administered 2023-07-05 (×2): 2 via ORAL
  Administered 2023-07-05 – 2023-07-06 (×2): 1 via ORAL
  Administered 2023-07-06: 2 via ORAL
  Administered 2023-07-06: 1 via ORAL
  Filled 2023-07-04 (×4): qty 2
  Filled 2023-07-04 (×2): qty 1
  Filled 2023-07-04 (×5): qty 2

## 2023-07-04 MED ORDER — METHOCARBAMOL 500 MG PO TABS
500.0000 mg | ORAL_TABLET | Freq: Four times a day (QID) | ORAL | Status: DC
  Administered 2023-07-04 – 2023-07-06 (×9): 500 mg via ORAL
  Filled 2023-07-04 (×10): qty 1

## 2023-07-04 NOTE — Evaluation (Signed)
Physical Therapy Evaluation Patient Details Name: Karen Allison MRN: [id number] DOB: 12/15/1945 Today's Date: 07/04/2023  History of Present Illness  Pt is a 77 y/o female presenting on 11/7 after fall. Imaging found with acetabular fx of R hip and R inferior pubic ramus. 11/11 S/P ORIF R acetabular fx, TDWB.  PMH includes: HTN, afib on Eliquis, stage IV triple positive breast cancer, arthritis, ACDF C3-4. ORIF L humerus.  Clinical Impression  Pt admitted with above diagnosis. Pt currently with functional limitations due to the deficits listed below (see PT Problem List). At the time of PT eval pt was able to perform transfers with up to +2 mod-max assist and RW for support. Anticipate pt will progress well with intensive, multidisciplinary rehab at d/c. Acutely, pt will benefit from acute skilled PT to increase their independence and safety with mobility to allow discharge.           If plan is discharge home, recommend the following: Two people to help with walking and/or transfers;A lot of help with bathing/dressing/bathroom;Assistance with cooking/housework;Assist for transportation;Help with stairs or ramp for entrance   Can travel by private vehicle        Equipment Recommendations Rolling walker (2 wheels)  Recommendations for Other Services       Functional Status Assessment Patient has had a recent decline in their functional status and demonstrates the ability to make significant improvements in function in a reasonable and predictable amount of time.     Precautions / Restrictions Precautions Precautions: Fall Restrictions Weight Bearing Restrictions: Yes RLE Weight Bearing: Touchdown weight bearing      Mobility  Bed Mobility Overal bed mobility: Needs Assistance Bed Mobility: Supine to Sit     Supine to sit: Mod assist, +2 for safety/equipment     General bed mobility comments: min assist for LB support and mod assist for trunk, cueing for technique. Pt  able to reach for railing and assist with scooting.    Transfers Overall transfer level: Needs assistance Equipment used: Rolling walker (2 wheels) Transfers: Sit to/from Stand, Bed to chair/wheelchair/BSC Sit to Stand: Max assist, Mod assist, +2 physical assistance   Step pivot transfers: Mod assist, +2 physical assistance       General transfer comment: VC's for hand placement on seated surface for safety. Max assist +2 to power up from EOB fading to mod assist +2 on 2nd trial    Ambulation/Gait         Gait velocity: Decreased Gait velocity interpretation: <1.31 ft/sec, indicative of household ambulator   General Gait Details: Unable to progress to true gait training this session but pt able to demonstrate a few steps away from bed prior to pivot around to chair.  Stairs            Wheelchair Mobility     Tilt Bed    Modified Rankin (Stroke Patients Only)       Balance Overall balance assessment: Needs assistance Sitting-balance support: No upper extremity supported, Feet supported Sitting balance-Leahy Scale: Fair     Standing balance support: Bilateral upper extremity supported, During functional activity, Reliant on assistive device for balance Standing balance-Leahy Scale: Poor Standing balance comment: relies on BUE and external support                             Pertinent Vitals/Pain Pain Assessment Pain Assessment: Faces Faces Pain Scale: Hurts even more Pain Location: R hip Pain Descriptors /  Indicators: Grimacing, Discomfort, Operative site guarding Pain Intervention(s): Limited activity within patient's tolerance, Monitored during session, Repositioned    Home Living Family/patient expects to be discharged to:: Private residence Living Arrangements: Spouse/significant other Available Help at Discharge: Family Type of Home: House Home Access: Stairs to enter;Ramped entrance Entrance Stairs-Rails: Left Entrance Stairs-Number  of Steps: 2   Home Layout: Two level;Able to live on main level with bedroom/bathroom Home Equipment: Tub bench;Shower seat;BSC/3in1;Rollator (4 wheels);Cane - single point Additional Comments: Chair lift    Prior Function Prior Level of Function : Independent/Modified Independent;Driving             Mobility Comments: Rollator at baseline outside and [facility] getting into the grocery store from car to cart ADLs Comments: ind ADLs     Extremity/Trunk Assessment   Upper Extremity Assessment Upper Extremity Assessment: Defer to OT evaluation LUE Deficits / Details: hx of L humerus ORIF    Lower Extremity Assessment Lower Extremity Assessment: RLE deficits/detail RLE Deficits / Details: Acute pain, decreased strength and AROM consistent with above mentioned injury. RLE: Unable to fully assess due to pain    Cervical / Trunk Assessment Cervical / Trunk Assessment: Other exceptions Cervical / Trunk Exceptions: Forward head posture with rounded shoulders  Communication   Communication Communication: No apparent difficulties  Cognition Arousal: Alert Behavior During Therapy: WFL for tasks assessed/performed Overall Cognitive Status: Impaired/Different from baseline Area of Impairment: Attention, Following commands, Problem solving                   Current Attention Level: Sustained   Following Commands: Follows one step commands consistently, Follows one step commands with increased time     Problem Solving: Slow processing, Requires verbal cues General Comments: patient oriented and following commands, good awareness to deficits but some decreased attention to task requring redirection- maybe baseline        General Comments General comments (skin integrity, edema, etc.): Granddaughter at side and supportive    Exercises General Exercises - Lower Extremity Long Arc Quad: 10 reps Heel Slides: 5 reps   Assessment/Plan    PT Assessment Patient needs continued  PT services  PT Problem List Decreased strength;Decreased activity tolerance;Decreased balance;Decreased mobility;Decreased range of motion;Decreased knowledge of use of DME;Decreased safety awareness;Decreased knowledge of precautions;Pain       PT Treatment Interventions DME instruction;Gait training;Stair training;Functional mobility training;Therapeutic activities;Therapeutic exercise;Balance training;Patient/family education    PT Goals (Current goals can be found in the Care Plan section)  Acute Rehab PT Goals Patient Stated Goal: Be able to go home after rehab PT Goal Formulation: With patient/family Time For Goal Achievement: 07/11/23 Potential to Achieve Goals: Good    Frequency Min 1X/week     Co-evaluation PT/OT/SLP Co-Evaluation/Treatment: Yes Reason for Co-Treatment: For patient/therapist safety;To address functional/ADL transfers PT goals addressed during session: Mobility/safety with mobility;Balance;Proper use of DME;Strengthening/ROM OT goals addressed during session: ADL's and self-care       AM-PAC PT "6 Clicks" Mobility  Outcome Measure Help needed turning from your back to your side while in a flat bed without using bedrails?: A Lot Help needed moving from lying on your back to sitting on the side of a flat bed without using bedrails?: A Lot Help needed moving to and from a bed to a chair (including a wheelchair)?: Total Help needed standing up from a chair using your arms (e.g., wheelchair or bedside chair)?: Total Help needed to walk in hospital room?: Total Help needed climbing 3-5 steps with a  railing? : Total 6 Click Score: 8    End of Session   Activity Tolerance: Patient tolerated treatment well Patient left: in chair;with call bell/phone within reach;with family/visitor present Nurse Communication: Mobility status PT Visit Diagnosis: Unsteadiness on feet (R26.81);Pain;History of falling (Z91.81) Pain - Right/Left: Right Pain - part of body:  Hip    Time: 4098-1191 PT Time Calculation (min) (ACUTE ONLY): 35 min   Charges:   PT Evaluation $PT Eval Moderate Complexity: 1 Mod   PT General Charges $$ ACUTE PT VISIT: 1 Visit         Conni Slipper, PT, DPT Acute Rehabilitation Services Secure Chat Preferred Office: [phone]   Marylynn Pearson 07/04/2023, 2:18 PM

## 2023-07-04 NOTE — Care Management Important Message (Signed)
Important Message  Patient Details  Name: Karen Allison MRN: [id number] Date of Birth: 10/22/1945   Important Message Given:  Yes - Medicare IM     Sherilyn Banker 07/04/2023, 2:28 PM

## 2023-07-04 NOTE — Progress Notes (Signed)
Patient requested foley to be removed only after working with PT

## 2023-07-04 NOTE — Evaluation (Signed)
Occupational Therapy Evaluation Patient Details Name: Karen Allison MRN: [id number] DOB: [birth year] Today's Date: 07/04/2023   History of Present Illness Pt is a 77 y/o female presenting on 11/7 after fall. Imaging found with acetabular fx of R hip and R inferior pubic ramus. 11/11 S/P ORIF R acetabular fx, TDWB.  PMH includes: HTN, afib on Eliquis, stage IV triple positive breast cancer, arthritis, ACDF C3-4. ORIF L humerus.   Clinical Impression   PTA patient independent with ADLs, using rollator in her home and cane in community.  Admitted for above and presents with problem list below. She requires mod assist for bed mobility, mod-max assist +2 for transfers, and setup to total assist for ADLs.  She adheres well to her TDWB to R LE, but requires cueing for safety and problem solving throughout session.  Based on performance today, believe pt will best benefit from continued OT services acutely and after dc at an inpatient setting with >3hrs/day to optimize independence, safety and return to PLOF with ADLs and mobility.        If plan is discharge home, recommend the following: A lot of help with bathing/dressing/bathroom;Two people to help with walking and/or transfers;Assistance with cooking/housework;Assist for transportation;Help with stairs or ramp for entrance    Functional Status Assessment  Patient has had a recent decline in their functional status and demonstrates the ability to make significant improvements in function in a reasonable and predictable amount of time.  Equipment Recommendations  Other (comment) (defer)    Recommendations for Other Services Rehab consult     Precautions / Restrictions Precautions Precautions: Fall Restrictions Weight Bearing Restrictions: Yes RLE Weight Bearing: Touchdown weight bearing      Mobility Bed Mobility Overal bed mobility: Needs Assistance Bed Mobility: Supine to Sit     Supine to sit: Mod assist, +2 for  safety/equipment     General bed mobility comments: min assist for LB support and mod assist for trunk, cueing for technique    Transfers Overall transfer level: Needs assistance Equipment used: Rolling walker (2 wheels) Transfers: Sit to/from Stand, Bed to chair/wheelchair/BSC Sit to Stand: Max assist, Mod assist, +2 physical assistance     Step pivot transfers: Mod assist, +2 physical assistance     General transfer comment: cueing for hand placement and safety, max assist +2 to power up from EOB fading to mod assist +2 on 2nd trial      Balance Overall balance assessment: Needs assistance Sitting-balance support: No upper extremity supported, Feet supported Sitting balance-Leahy Scale: Fair     Standing balance support: Bilateral upper extremity supported, During functional activity, Reliant on assistive device for balance Standing balance-Leahy Scale: Poor Standing balance comment: relies on BUE and external support                           ADL either performed or assessed with clinical judgement   ADL Overall ADL's : Needs assistance/impaired     Grooming: Set up;Sitting           Upper Body Dressing : Set up;Sitting   Lower Body Dressing: Total assistance;+2 for physical assistance;Sit to/from stand   Toilet Transfer: Moderate assistance;Maximal assistance;+2 for physical assistance;Rolling walker (2 wheels) Toilet Transfer Details (indicate cue type and reason): stand pivot to recliner         Functional mobility during ADLs: Moderate assistance;Maximal assistance;+2 for physical assistance;Rolling walker (2 wheels)       Vision  Vision Assessment?: No apparent visual deficits     Perception         Praxis         Pertinent Vitals/Pain Pain Assessment Pain Assessment: Faces Faces Pain Scale: Hurts even more Pain Location: R hip Pain Descriptors / Indicators: Grimacing, Discomfort, Operative site guarding Pain Intervention(s):  Limited activity within patient's tolerance, Monitored during session, Repositioned     Extremity/Trunk Assessment Upper Extremity Assessment Upper Extremity Assessment: LUE deficits/detail;Generalized weakness LUE Deficits / Details: hx of L humerus ORIF   Lower Extremity Assessment Lower Extremity Assessment: Defer to PT evaluation       Communication Communication Communication: No apparent difficulties   Cognition Arousal: Alert Behavior During Therapy: WFL for tasks assessed/performed Overall Cognitive Status: Impaired/Different from baseline Area of Impairment: Attention, Following commands, Problem solving                   Current Attention Level: Sustained   Following Commands: Follows one step commands consistently, Follows one step commands with increased time     Problem Solving: Slow processing, Requires verbal cues General Comments: patient oriented and following commands, good awareness to deficits but some decreased attention to task requring redirection- maybe baseline     General Comments  Granddaughter at side and supportive    Exercises     Shoulder Instructions      Home Living Family/patient expects to be discharged to:: Private residence Living Arrangements: Spouse/significant other Available Help at Discharge: Family Type of Home: House Home Access: Stairs to enter;Ramped entrance Entrance Stairs-Number of Steps: 2 Entrance Stairs-Rails: Left Home Layout: Two level;Able to live on main level with bedroom/bathroom     Bathroom Shower/Tub: Producer, television/film/video: Standard     Home Equipment: Tub bench;Shower seat;BSC/3in1;Rollator (4 wheels);Cane - single point   Additional Comments: Chair lift      Prior Functioning/Environment Prior Level of Function : Independent/Modified Independent;Driving             Mobility Comments: Rollator at baseline outside and [facility] getting into the grocery store from car to  cart ADLs Comments: ind ADLs        OT Problem List: Decreased strength;Decreased activity tolerance;Impaired balance (sitting and/or standing);Decreased knowledge of use of DME or AE;Decreased knowledge of precautions;Pain      OT Treatment/Interventions: Self-care/ADL training;Therapeutic exercise;DME and/or AE instruction;Therapeutic activities;Cognitive remediation/compensation;Balance training;Patient/family education    OT Goals(Current goals can be found in the care plan section) Acute Rehab OT Goals Patient Stated Goal: rehab OT Goal Formulation: With patient Time For Goal Achievement: 07/18/23 Potential to Achieve Goals: Good  OT Frequency: Min 1X/week    Co-evaluation PT/OT/SLP Co-Evaluation/Treatment: Yes Reason for Co-Treatment: For patient/therapist safety;To address functional/ADL transfers   OT goals addressed during session: ADL's and self-care      AM-PAC OT "6 Clicks" Daily Activity     Outcome Measure Help from another person eating meals?: A Little Help from another person taking care of personal grooming?: A Little Help from another person toileting, which includes using toliet, bedpan, or urinal?: A Lot Help from another person bathing (including washing, rinsing, drying)?: A Lot Help from another person to put on and taking off regular upper body clothing?: A Little Help from another person to put on and taking off regular lower body clothing?: A Lot 6 Click Score: 15   End of Session Equipment Utilized During Treatment: Gait belt;Rolling walker (2 wheels) Nurse Communication: Mobility status  Activity Tolerance: Patient tolerated treatment  well Patient left: in chair;with call bell/phone within reach;with family/visitor present  OT Visit Diagnosis: Other abnormalities of gait and mobility (R26.89);Muscle weakness (generalized) (M62.81);Pain;History of falling (Z91.81) Pain - Right/Left: Right Pain - part of body: Hip                Time:  9518-8416 OT Time Calculation (min): 36 min Charges:  OT General Charges $OT Visit: 1 Visit OT Evaluation $OT Eval Moderate Complexity: 1 Mod  Barry Brunner, OT Acute Rehabilitation Services Office [phone]   Chancy Milroy 07/04/2023, 1:01 PM

## 2023-07-04 NOTE — Progress Notes (Signed)
Inpatient Rehab Admissions Coordinator:  ? ?Per therapy recommendations,  patient was screened for CIR candidacy by Devaney Segers, MS, CCC-SLP. At this time, Pt. Appears to be a a potential candidate for CIR. I will place   order for rehab consult per protocol for full assessment. Please contact me any with questions. ? ?Trine Fread, MS, CCC-SLP ?Rehab Admissions Coordinator  ?[phone] (celll) ?[phone] (office) ? ?

## 2023-07-04 NOTE — Progress Notes (Signed)
Orthopaedic Trauma Progress Note  SUBJECTIVE: Doing ok today. Having a lot of tingling from hips down to feet, this is baseline for her. I have adjusted her gabapentin to get her back to her home doses.  We also went through her pain medication regimen together and adjusted things to her home doses.  Patient also notes some increased right knee pain since time of injury.  No formal imaging performed in the ED.  Will order knee x-ray this morning.  No chest pain. No SOB. No nausea/vomiting. No other complaints.  Has not been up out of bed yet since surgery.  Granddaughter at bedside.  Patient very interested in CIR.  OBJECTIVE:  Vitals:   07/04/23 0636 07/04/23 0800  BP: 124/73 122/65  Pulse: 89 78  Resp:    Temp: 97.6 F (36.4 C) 97.8 F (36.6 C)  SpO2: 99% 100%    General: Sitting up in bed, no acute distress Respiratory: No increased work of breathing.  Pelvis/right extremity: Dressing over the anterior pelvis is clean, dry, intact.  Tenderness around the incision as expected.  No significant tenderness over the lateral hip.  Some baseline soreness throughout the thigh and calf.  Ankle DF/PF intact.  Toes warm and well-perfused.  Equal to contralateral side.  Able to wiggle toes.  Endorses sensation light touch over all aspects of the foot. + DP pulse  IMAGING: Stable post op imaging.   LABS:  Results for orders placed or performed during the hospital encounter of 06/29/23 (from the past 24 hour(s))  VITAMIN D 25 Hydroxy (Vit-D Deficiency, Fractures)     Status: None   Collection Time: 07/03/23 12:00 PM  Result Value Ref Range   Vit D, 25-Hydroxy 33.22 30 - 100 ng/mL  Basic metabolic panel     Status: Abnormal   Collection Time: 07/04/23  6:17 AM  Result Value Ref Range   Sodium 131 (L) 135 - 145 mmol/L   Potassium 3.9 3.5 - 5.1 mmol/L   Chloride 96 (L) 98 - 111 mmol/L   CO2 27 22 - 32 mmol/L   Glucose, Bld 179 (H) 70 - 99 mg/dL   BUN 8 8 - 23 mg/dL   Creatinine, Ser 1.61 0.44  - 1.00 mg/dL   Calcium 8.1 (L) 8.9 - 10.3 mg/dL   GFR, Estimated >09 >60 mL/min   Anion gap 8 5 - 15  CBC     Status: Abnormal   Collection Time: 07/04/23  6:17 AM  Result Value Ref Range   WBC 9.4 4.0 - 10.5 K/uL   RBC 3.16 (L) 3.87 - 5.11 MIL/uL   Hemoglobin 10.0 (L) 12.0 - 15.0 g/dL   HCT 45.4 (L) 09.8 - 11.9 %   MCV 92.1 80.0 - 100.0 fL   MCH 31.6 26.0 - 34.0 pg   MCHC 34.4 30.0 - 36.0 g/dL   RDW 14.7 82.9 - 56.2 %   Platelets 109 (L) 150 - 400 K/uL   nRBC 0.0 0.0 - 0.2 %    ASSESSMENT: Karen Allison is a 77 y.o. female, 1 Day Post-Op s/p OPEN REDUCTION INTERNAL FIXATION RIGHT ACETABULAR FRACTURE STOPPA  CV/Blood loss: Acute blood loss anemia, Hgb 10.0 this morning. Hemodynamically stable  PLAN: Weightbearing: TDWB RLE ROM: Unrestricted hip motion.  No posterior hip precautions needed. Incisional and dressing care: Reinforce dressings as needed  Showering: Okay to begin showering and getting incisions wet 07/06/2023 Orthopedic device(s): None  Pain management:  1. Tylenol 650 mg TID PRN 2. Robaxin 500  mg QID 3. Norco 5-325 mg (1-2 tabs) q 4 hours PRN 4. Dilaudid 1 mg q 3 hours PRN 5. Gabapentin 600 mg TID and 1200 mg QHS VTE prophylaxis:  Restart home dose Eliquis today , SCDs ID: Vancomycin post op Foley/Lines: Remove Foley catheter today.  KVO IVFs Impediments to Fracture Healing: Vitamin D level low normal at 33, started on supplementation Dispo: PT/OT evaluation today, dispo pending.  Patient very interested in CIR.  Plan to remove dressing from anterior pelvis tomorrow 07/05/2023.   D/C recommendations: -Hydrocodone, gabapentin, and Robaxin for pain control -Home dose Eliquis for DVT prophylaxis -Continue 1000 units Vit D supplementation daily x 90 days  Follow - up plan: 2 weeks   Contact information:  Truitt Merle MD, Thyra Breed PA-C. After hours and holidays please check [email or web address] for group call information for [facility]   Thompson Caul, PA-C [phone] (office) [email or web address]

## 2023-07-04 NOTE — Progress Notes (Signed)
[facility]  PROGRESS NOTE  Karen Allison AOZ:[id number] DOB: 12/01/1945 DOA: 06/29/2023 PCP: Philemon Kingdom, MD   Brief HPI:   77 y.o. female with medical history significant of atrial fibrillation on Eliquis, hypertension, stage IV triple positive breast cancer, hypothyroidism presenting after mechanical fall.  She states that she was in her usual state of health until this morning.  She made herself some coffee and wore her slippers and as she was walking she tripped on herself and fell towards her right side. Patient does have a history of atrial fibrillation and is currently taking Eliquis 2.5 mg twice daily. Her last dose was on the night of 11/6.  Orthopedics was consulted    Assessment/Plan:   Right acetabulum fracture -S/p ORIF right acetabular fracture -Orthopedics following -Eliquis has been restarted per orthopedics   Atrial fibrillation with RVR -Patient went into A-fib with RVR -She takes metoprolol 25 mg daily at home, now dose increased to 50 mg daily -Also started on Cardizem 30 mg p.o. every 8 hours, however patient refused Cardizem -Continue  Cardizem  30 mg p.o. every 6 hours as needed for heart rate greater than 120 -Anticoagulation with Eliquis was on hold for surgery on Monday;  -Eliquis restarted today  Hypertension -Blood pressure is stable -Continue Toprol XL 50 mg daily  ?  HFpEF -Last echo from August showed moderate pulmonary hypertension -EF was 55 to 60% -Patient takes Lasix 20 mg intermittently at home -Diuresed well with p.o. Lasix 20 mg given intermittently over past 1 week   Hypothyroidism -Continue Synthroid 50 mcg daily -Check TSH  GERD -Continue p.o. Protonix  Hyponatremia; hypervolemic hyponatremia -Improved 131 with diuresis -Urine osmolality 206 -Serum osmolality 275 -Recheck BMP in a.m.   Stage IV breast cancer with mets to liver and lung -She currently is on trastuzumab every 3 weeks and continues to have  her disease under control.  -Followed by oncology as outpatient   Medications     apixaban  2.5 mg Oral BID   Chlorhexidine Gluconate Cloth  6 each Topical Q0600   diazepam  10 mg Oral QHS   docusate sodium  100 mg Oral Daily   doxycycline  50 mg Oral Daily   gabapentin  600 mg Oral TID   levothyroxine  50 mcg Oral Q0600   methocarbamol  500 mg Oral QID   metoprolol succinate  50 mg Oral QHS   pantoprazole  40 mg Oral Daily   rosuvastatin  10 mg Oral Q Sun   senna-docusate  3 tablet Oral QHS   tamsulosin  0.4 mg Oral QPM     Data Reviewed:   CBG:  No results for input(s): "GLUCAP" in the last 168 hours.  SpO2: 100 % O2 Flow Rate (L/min): 0 L/min    Vitals:   07/03/23 2043 07/03/23 2129 07/04/23 0636 07/04/23 0800  BP: 121/75 121/75 124/73 122/65  Pulse: 98 98 89 78  Resp: 17     Temp: 97.6 F (36.4 C)  97.6 F (36.4 C) 97.8 F (36.6 C)  TempSrc: Oral  Oral Oral  SpO2: 98%  99% 100%  Weight:      Height:          Data Reviewed:  Basic Metabolic Panel: Recent Labs  Lab 06/29/23 1435 06/30/23 0610 07/01/23 0545 07/02/23 0542 07/04/23 0617  NA 135 131* 127* 128* 131*  K 3.6 4.1 4.0 4.0 3.9  CL 95* 97* 94* 94* 96*  CO2 28 27 26 24  27  GLUCOSE 140* 126* 139* 128* 179*  BUN 15 11 8 10 8   CREATININE 0.67 0.68 0.66 0.58 0.60  CALCIUM 8.9 7.9* 8.0* 8.0* 8.1*  MG  --  1.6*  --   --   --   PHOS  --  3.2  --   --   --     CBC: Recent Labs  Lab 06/29/23 1435 06/30/23 0610 07/01/23 0545 07/04/23 0617  WBC 13.1* 10.9* 9.4 9.4  NEUTROABS 11.4*  --   --   --   HGB 12.6 11.6* 11.5* 10.0*  HCT 39.0 34.9* 34.7* 29.1*  MCV 97.7 94.6 93.0 92.1  PLT 143* 110* 98* 109*    LFT Recent Labs  Lab 07/01/23 0545  AST 26  ALT 20  ALKPHOS 49  BILITOT 1.0  PROT 5.6*  ALBUMIN 2.7*     Antibiotics: Anti-infectives (From admission, onward)    Start     Dose/Rate Route Frequency Ordered Stop   07/03/23 2000  vancomycin (VANCOCIN) IVPB 1000 mg/200 mL  premix        1,000 mg 200 mL/hr over 60 Minutes Intravenous Every 12 hours 07/03/23 1140 07/03/23 2241   07/03/23 0928  vancomycin (VANCOCIN) powder  Status:  Discontinued          As needed 07/03/23 0928 07/03/23 0949   07/03/23 0615  levofloxacin (LEVAQUIN) IVPB 500 mg        500 mg 100 mL/hr over 60 Minutes Intravenous On call to O.R. 07/02/23 1953 07/03/23 0859   07/03/23 0515  vancomycin (VANCOCIN) IVPB 1000 mg/200 mL premix        1,000 mg 200 mL/hr over 60 Minutes Intravenous On call to O.R. 07/02/23 1953 07/03/23 0901   07/01/23 1030  doxycycline (VIBRAMYCIN) 50 MG capsule 50 mg        50 mg Oral Daily 07/01/23 0941          DVT prophylaxis: Eliquis  Code Status: Full code  Family Communication: Discussed with patient's grand daughter at bedside   CONSULTS orthopedics   Subjective   Patient seen, denies pain.  Heart rate trend controlled with metoprolol.   Objective    Physical Examination:  General-appears in no acute distress Heart- irregular, no murmur auscultated Lungs-clear to auscultation bilaterally, no wheezing or crackles auscultated Abdomen-soft, nontender, no organomegaly Extremities-no edema in the lower extremities Neuro-alert, oriented x3, no focal deficit noted  Status is: Inpatient:             Meredeth Ide   [facility] If 7PM-7AM, please contact night-coverage at [email or web address], Office  [phone]   07/04/2023, 9:49 AM  LOS: 5 days

## 2023-07-04 NOTE — Plan of Care (Signed)
  Problem: Nutrition: Goal: Adequate nutrition will be maintained Outcome: Progressing   Problem: Elimination: Goal: Will not experience complications related to bowel motility Outcome: Progressing   Problem: Pain Management: Goal: General experience of comfort will improve Outcome: Progressing   Problem: Safety: Goal: Ability to remain free from injury will improve Outcome: Progressing

## 2023-07-05 ENCOUNTER — Encounter (HOSPITAL_COMMUNITY): Payer: Self-pay | Admitting: Student

## 2023-07-05 DIAGNOSIS — S32401D Unspecified fracture of right acetabulum, subsequent encounter for fracture with routine healing: Secondary | ICD-10-CM | POA: Diagnosis not present

## 2023-07-05 DIAGNOSIS — D696 Thrombocytopenia, unspecified: Secondary | ICD-10-CM

## 2023-07-05 DIAGNOSIS — I480 Paroxysmal atrial fibrillation: Secondary | ICD-10-CM | POA: Diagnosis not present

## 2023-07-05 LAB — BASIC METABOLIC PANEL
Anion gap: 8 (ref 5–15)
BUN: 7 mg/dL — ABNORMAL LOW (ref 8–23)
CO2: 26 mmol/L (ref 22–32)
Calcium: 7.9 mg/dL — ABNORMAL LOW (ref 8.9–10.3)
Chloride: 96 mmol/L — ABNORMAL LOW (ref 98–111)
Creatinine, Ser: 0.75 mg/dL (ref 0.44–1.00)
GFR, Estimated: >60 mL/min (ref >60)
Glucose, Bld: 139 mg/dL — ABNORMAL HIGH (ref 70–99)
Potassium: 3.6 mmol/L (ref 3.5–5.1)
Sodium: 130 mmol/L — ABNORMAL LOW (ref 135–145)

## 2023-07-05 LAB — CBC
HCT: 30.7 % — ABNORMAL LOW (ref 36.0–46.0)
Hemoglobin: 10.2 g/dL — ABNORMAL LOW (ref 12.0–15.0)
MCH: 30.7 pg (ref 26.0–34.0)
MCHC: 33.2 g/dL (ref 30.0–36.0)
MCV: 92.5 fL (ref 80.0–100.0)
Platelets: 134 10*3/uL — ABNORMAL LOW (ref 150–400)
RBC: 3.32 MIL/uL — ABNORMAL LOW (ref 3.87–5.11)
RDW: 12.9 % (ref 11.5–15.5)
WBC: 9.8 10*3/uL (ref 4.0–10.5)
nRBC: 0 % (ref 0.0–0.2)

## 2023-07-05 MED ORDER — METHYLPREDNISOLONE ACETATE 40 MG/ML IJ SUSP
40.0000 mg | Freq: Once | INTRAMUSCULAR | Status: AC
  Administered 2023-07-05: 40 mg via INTRA_ARTICULAR
  Filled 2023-07-05 (×3): qty 1

## 2023-07-05 MED ORDER — BISACODYL 10 MG RE SUPP
10.0000 mg | Freq: Once | RECTAL | Status: AC
  Administered 2023-07-05: 10 mg via RECTAL
  Filled 2023-07-05: qty 1

## 2023-07-05 MED ORDER — BISACODYL 10 MG RE SUPP: 10.0000 mg | Freq: Every day | RECTAL | Status: DC | PRN

## 2023-07-05 MED ORDER — PENTAFLUOROPROP-TETRAFLUOROETH EX AERO: INHALATION_SPRAY | Freq: Once | CUTANEOUS | Status: DC

## 2023-07-05 MED ORDER — LIDOCAINE HCL (PF) 1 % IJ SOLN: 5.0000 mL | Freq: Once | INTRAMUSCULAR | Status: DC

## 2023-07-05 MED ORDER — SENNOSIDES-DOCUSATE SODIUM 8.6-50 MG PO TABS
2.0000 | ORAL_TABLET | Freq: Two times a day (BID) | ORAL | Status: DC
Start: 2023-07-05 — End: 2023-07-06
  Administered 2023-07-05 – 2023-07-06 (×3): 2 via ORAL
  Filled 2023-07-05 (×3): qty 2

## 2023-07-05 MED ORDER — MILK AND MOLASSES ENEMA
1.0000 | Freq: Once | RECTAL | Status: AC
  Administered 2023-07-05: 240 mL via RECTAL
  Filled 2023-07-05: qty 240

## 2023-07-05 MED ORDER — LIDOCAINE HCL 1 % IJ SOLN
5.0000 mL | Freq: Once | INTRAMUSCULAR | Status: DC
  Filled 2023-07-05: qty 5

## 2023-07-05 MED ORDER — PENTAFLUOROPROP-TETRAFLUOROETH EX AERO
INHALATION_SPRAY | CUTANEOUS | Status: DC | PRN
  Filled 2023-07-05: qty 116

## 2023-07-05 MED ORDER — DIPHENHYDRAMINE HCL 25 MG PO CAPS
25.0000 mg | ORAL_CAPSULE | Freq: Once | ORAL | Status: AC
Start: 2023-07-05 — End: 2023-07-05
  Administered 2023-07-05: 25 mg via ORAL
  Filled 2023-07-05: qty 1

## 2023-07-05 MED ORDER — VITAMIN D 25 MCG (1000 UNIT) PO TABS
1000.0000 [IU] | ORAL_TABLET | Freq: Every day | ORAL | Status: DC
  Filled 2023-07-05: qty 1

## 2023-07-05 MED ORDER — DOCUSATE SODIUM 100 MG PO CAPS
100.0000 mg | ORAL_CAPSULE | Freq: Two times a day (BID) | ORAL | Status: DC
Start: 2023-07-05 — End: 2023-07-06
  Administered 2023-07-05 – 2023-07-06 (×2): 100 mg via ORAL
  Filled 2023-07-05 (×2): qty 1

## 2023-07-05 MED ORDER — LIDOCAINE HCL (PF) 1 % IJ SOLN
5.0000 mL | Freq: Once | INTRAMUSCULAR | Status: AC
  Administered 2023-07-05: 5 mL

## 2023-07-05 NOTE — Progress Notes (Signed)
Pt c/o itching. When assessing the pt, found rashes on pt's groin, buttocks, and underneath the breasts. No other signs of allergic reactions. Lung sounds clear. On-call provider notified. One time dose of benadryl order received and given to the pt. Will continue to monitor.

## 2023-07-05 NOTE — Progress Notes (Signed)
Physical Therapy Treatment  Patient Details Name: Karen Allison MRN: [id number] DOB: 01/01/1946 Today's Date: 07/05/2023   History of Present Illness Pt is a 77 y/o female presenting on 11/7 after fall. Imaging found with acetabular fx of R hip and R inferior pubic ramus. 11/11 S/P ORIF R acetabular fx, TDWB.  PMH includes: HTN, afib on Eliquis, stage IV triple positive breast cancer, arthritis, ACDF C3-4. ORIF L humerus.    PT Comments  Pt progressing towards physical therapy goals. Pt was able to ambulate in room and appeared to maintain TDWB status well with RW for support and min assist from therapist. Pt reporting increased pain in L shoulder due to heavy UE weight bearing on the walker. Ortho MD present during session and reported to him. Pt hopeful for injection in that shoulder during this admission. Will continue to follow and progress as able per POC.    If plan is discharge home, recommend the following: Two people to help with walking and/or transfers;A lot of help with bathing/dressing/bathroom;Assistance with cooking/housework;Assist for transportation;Help with stairs or ramp for entrance   Can travel by private vehicle        Equipment Recommendations  Rolling walker (2 wheels)    Recommendations for Other Services       Precautions / Restrictions Precautions Precautions: Fall Restrictions Weight Bearing Restrictions: Yes RLE Weight Bearing: Touchdown weight bearing     Mobility  Bed Mobility               General bed mobility comments: Pt was received sitting up in the recliner.    Transfers Overall transfer level: Needs assistance Equipment used: Rolling walker (2 wheels) Transfers: Sit to/from Stand, Bed to chair/wheelchair/BSC Sit to Stand: +2 physical assistance, Mod assist   Step pivot transfers: Mod assist, +2 physical assistance       General transfer comment: Increased time. VC's for hand placement on seated surface for safety.     Ambulation/Gait Ambulation/Gait assistance: Min assist, +2 safety/equipment Gait Distance (Feet): 15 Feet Assistive device: Rolling walker (2 wheels) Gait Pattern/deviations: Step-to pattern, Decreased stride length, Trunk flexed, Narrow base of support Gait velocity: Decreased Gait velocity interpretation: <1.31 ft/sec, indicative of household ambulator   General Gait Details: VC's for improved posture, closer walker proximity and forward gaze. Pt appearing to maintain TDWB status well however VC's throughout for reminders.   Stairs             Wheelchair Mobility     Tilt Bed    Modified Rankin (Stroke Patients Only)       Balance Overall balance assessment: Needs assistance Sitting-balance support: No upper extremity supported, Feet supported Sitting balance-Leahy Scale: Fair     Standing balance support: Bilateral upper extremity supported, During functional activity, Reliant on assistive device for balance Standing balance-Leahy Scale: Poor Standing balance comment: relies on BUE and external support                            Cognition Arousal: Alert Behavior During Therapy: WFL for tasks assessed/performed Overall Cognitive Status: Within Functional Limits for tasks assessed                                          Exercises      General Comments        Pertinent Vitals/Pain Pain Assessment  Faces Pain Scale: Hurts even more Pain Location: R hip Pain Descriptors / Indicators: Grimacing, Discomfort, Operative site guarding    Home Living                          Prior Function            PT Goals (current goals can now be found in the care plan section) Acute Rehab PT Goals Patient Stated Goal: Be able to go home after rehab PT Goal Formulation: With patient/family Time For Goal Achievement: 07/11/23 Potential to Achieve Goals: Good Progress towards PT goals: Progressing toward goals     Frequency    Min 1X/week      PT Plan      Co-evaluation              AM-PAC PT "6 Clicks" Mobility   Outcome Measure  Help needed turning from your back to your side while in a flat bed without using bedrails?: A Lot Help needed moving from lying on your back to sitting on the side of a flat bed without using bedrails?: A Lot Help needed moving to and from a bed to a chair (including a wheelchair)?: A Lot Help needed standing up from a chair using your arms (e.g., wheelchair or bedside chair)?: A Lot Help needed to walk in hospital room?: A Lot Help needed climbing 3-5 steps with a railing? : Total 6 Click Score: 11    End of Session Equipment Utilized During Treatment: Gait belt Activity Tolerance: Patient tolerated treatment well Patient left: in chair;with call bell/phone within reach;with family/visitor present Nurse Communication: Mobility status PT Visit Diagnosis: Unsteadiness on feet (R26.81);Pain;History of falling (Z91.81) Pain - Right/Left: Right Pain - part of body: Hip     Time: 1610-9604 PT Time Calculation (min) (ACUTE ONLY): 30 min  Charges:    $Gait Training: 23-37 mins PT General Charges $$ ACUTE PT VISIT: 1 Visit                     Conni Slipper, PT, DPT Acute Rehabilitation Services Secure Chat Preferred Office: [phone]    Marylynn Pearson 07/05/2023, 4:04 PM

## 2023-07-05 NOTE — Progress Notes (Signed)
PROGRESS NOTE   Karen Allison  ZOX:[id number]    DOB: 07-Jan-1946    DOA: 06/29/2023  PCP: Philemon Kingdom, MD   I have briefly reviewed patients previous medical records in [facility].  Chief Complaint  Patient presents with   [facility] Course:  77 y.o. female with medical history significant of atrial fibrillation on Eliquis, hypertension, stage IV triple positive breast cancer, hypothyroidism, opioid related chronic constipation, presented after mechanical fall at home.  She made herself some coffee and wore her slippers and as she was walking she tripped on herself and fell towards her right side.  Imaging confirmed a right acetabular fracture.  Orthopedics consulted and patient underwent ORIF of right acetabular fracture on 11/11.  Being evaluated for potential CIR.  Assessment & Plan:  Principal Problem:   Acetabular fracture (HCC) Active Problems:   Hypertension   Adult hypothyroidism   A-fib (HCC)   Right acetabular fracture Sustained after a mechanical fall at home Orthopedics consulted and patient underwent ORIF of right acetabular fracture on 11/11 Per orthopedic follow-up: TDWB RLE, multimodality pain control, continue home dose of Eliquis which will suffice for postop DVT prophylaxis, bowel regimen for chronic constipation, vitamin D supplements, potential CIR, follow-up with orthopedics in 2 weeks of DC for wound check and repeat x-rays.  Atrial fibrillation/flutter with RVR -Patient went into A-fib with RVR earlier on in hospital course -She takes metoprolol 25 mg daily at home, now dose increased to 50 mg daily -Also started on Cardizem 30 mg p.o. every 8 hours, however patient refused Cardizem -Continue  Cardizem  30 mg p.o. every 6 hours as needed for heart rate greater than 120 -Eliquis held for surgery and was started postsurgery on 11/12. -Now rate controlled.   Essential Hypertension -Continue Toprol XL 50 mg daily.  Controlled    ?  HFpEF -Last echo from August showed moderate pulmonary hypertension -EF was 55 to 60% -Patient takes Lasix 20 mg intermittently at home -Diuresed well with p.o. Lasix 20 mg given intermittently over past 1 week -Clinically euvolemic.    Hypothyroidism -Continue Synthroid 50 mcg daily - TSH 1.825 on 11/9   GERD -Continue p.o. Protonix   Hyponatremia; hypervolemic hyponatremia -Improved 131 with diuresis -Urine osmolality 206, Serum osmolality 275 -Serum sodium stable in the low 130s.  Continue to follow BMP daily.   Stage IV breast cancer with mets to liver and lung -She currently is on trastuzumab every 3 weeks and continues to have her disease under control.  -Followed by oncology as outpatient  Nonpruritic skin rash Unclear etiology.  Per patient's history, appears to have it intermittently for unclear reasons. Reviewed med list in detail and along with pharmacist, mostly getting meds as she was getting at home. Skin appears dry.  Trial of moisturizing cream. Monitor closely and if worsens then may have to further evaluate.  Left shoulder pain Unclear etiology. Per patient's report, orthopedics are planning to inject steroids either today or tomorrow.  Chronic constipation Reports no real BM over the last several days.  Unsuccessful despite Colace, Dulcolax suppository.  Says that MiraLAX does not work for her and does not want to take it.  Agreeable to enema.  Thrombocytopenia: May be due to trauma and acute blood loss.  Lowest platelet count was 98 which is gradually improving and up to 134.  Trend daily CBCs  Chronic anemia Baseline hemoglobin may be in the 11-12 g range Hemoglobin now stable in the low  10 g range.  Body mass index is 24.49 kg/m.    DVT prophylaxis: apixaban (ELIQUIS) tablet 2.5 mg Start: 07/04/23 1000 SCDs Start: 07/03/23 1141 Place and maintain sequential compression device Start: 07/02/23 1004     Code Status: Full Code:  Family  Communication: Granddaughter at bedside Disposition:  Status is: Inpatient Remains inpatient appropriate because: Awaiting CIR     Consultants:   Orthopedics  Procedures:   As above   Antimicrobials:      Subjective:  Seen this morning along with granddaughter at bedside.  Reports constipation as noted above.  No abdominal pain, nausea or vomiting.  Also asking what we will going to do for her left shoulder pain-advised her to discuss with orthopedics.  Subsequently RN messaged about skin rashes and went back later this afternoon and reassess patient along with patient's female RN and granddaughter at bedside.  Objective:   Vitals:   07/04/23 2300 07/05/23 0415 07/05/23 0809 07/05/23 1313  BP:  112/63 115/61 (!) 101/52  Pulse:  99 94 94  Resp: 14 18 18 16   Temp:  98.8 F (37.1 C) 98.5 F (36.9 C) 98.3 F (36.8 C)  TempSrc:  Oral Oral Oral  SpO2:  98% 99% 96%  Weight:      Height:        General exam: Elderly female, moderately built and nourished, sitting up on bedside commode this afternoon.  Did not appear in any distress. Respiratory system: Clear to auscultation. Respiratory effort normal. Cardiovascular system: S1 & S2 heard, irregularly irregular. No JVD, murmurs, rubs, gallops or clicks. No pedal edema.  Telemetry personally reviewed: Atrial flutter with ventricular rates ranging between 90-100s. Gastrointestinal system: Abdomen is nondistended, soft and nontender. No organomegaly or masses felt. Normal bowel sounds heard.  Suprapubic surgical scar without acute findings.  No dressing noted either. Central nervous system: Alert and oriented. No focal neurological deficits. Extremities: Symmetric 5 x 5 power. Skin: Diffuse erythematous skin rash under bilateral breasts, bilateral upper back which is flush with the skin, not raised, nonpruritic.  Dry skin appreciated. Psychiatry: Judgement and insight appear normal. Mood & affect appropriate.     Data Reviewed:    I have personally reviewed following labs and imaging studies   CBC: Recent Labs  Lab 06/29/23 1435 06/30/23 0610 07/01/23 0545 07/04/23 0617 07/05/23 0557  WBC 13.1*   < > 9.4 9.4 9.8  NEUTROABS 11.4*  --   --   --   --   HGB 12.6   < > 11.5* 10.0* 10.2*  HCT 39.0   < > 34.7* 29.1* 30.7*  MCV 97.7   < > 93.0 92.1 92.5  PLT 143*   < > 98* 109* 134*   < > = values in this interval not displayed.    Basic Metabolic Panel: Recent Labs  Lab 06/30/23 0610 07/01/23 0545 07/02/23 0542 07/04/23 0617 07/05/23 0621  NA 131* 127* 128* 131* 130*  K 4.1 4.0 4.0 3.9 3.6  CL 97* 94* 94* 96* 96*  CO2 27 26 24 27 26   GLUCOSE 126* 139* 128* 179* 139*  BUN 11 8 10 8  7*  CREATININE 0.68 0.66 0.58 0.60 0.75  CALCIUM 7.9* 8.0* 8.0* 8.1* 7.9*  MG 1.6*  --   --   --   --   PHOS 3.2  --   --   --   --     Liver Function Tests: Recent Labs  Lab 07/01/23 0545  AST 26  ALT  20  ALKPHOS 49  BILITOT 1.0  PROT 5.6*  ALBUMIN 2.7*    CBG: No results for input(s): "GLUCAP" in the last 168 hours.  Microbiology Studies:   Recent Results (from the past 240 hour(s))  Surgical pcr screen     Status: None   Collection Time: 06/30/23  3:43 PM   Specimen: Nasal Mucosa; Nasal Swab  Result Value Ref Range Status   MRSA, PCR NEGATIVE NEGATIVE Final   Staphylococcus aureus NEGATIVE NEGATIVE Final    Comment: (NOTE) The Xpert SA Assay (FDA approved for NASAL specimens in patients 18 years of age and older), is one component of a comprehensive surveillance program. It is not intended to diagnose infection nor to guide or monitor treatment. Performed at [facility], [address], [place], [place] [address]     Radiology Studies:  DG Knee 1-2 Views Right  Result Date: 07/04/2023 CLINICAL DATA:  Right knee pain following a fall. EXAM: RIGHT KNEE - 1-2 VIEW COMPARISON:  None Available. FINDINGS: Moderate lateral joint space narrowing. Mild lateral and patellofemoral spur  formation. No fracture, dislocation or effusion. IMPRESSION: 1. No fracture. 2. Mild-to-moderate degenerative changes. Electronically Signed   By: Beckie Salts M.D.   On: 07/04/2023 15:10    Scheduled Meds:    apixaban  2.5 mg Oral BID   Chlorhexidine Gluconate Cloth  6 each Topical Q0600   cholecalciferol  1,000 Units Oral Daily   diazepam  10 mg Oral QHS   docusate sodium  100 mg Oral BID   doxycycline  50 mg Oral Daily   gabapentin  600 mg Oral TID   levothyroxine  50 mcg Oral Q0600   lidocaine (PF)  5 mL Other Once   methocarbamol  500 mg Oral QID   methylPREDNISolone acetate  40 mg Intra-articular Once   metoprolol succinate  50 mg Oral QHS   pantoprazole  40 mg Oral Daily   pentafluoroprop-tetrafluoroeth   Topical Once   rosuvastatin  10 mg Oral Q Sun   senna-docusate  2 tablet Oral BID   tamsulosin  0.4 mg Oral QPM    Continuous Infusions:     LOS: 6 days     Marcellus Scott, MD,  FACP, [facility], [facility], [facility]   [facility] [place]      To contact the attending provider between 7A-7P or the covering provider during after hours 7P-7A, please log into the web site [email or web address] and access using universal [employer] password for that web site. If you do not have the password, please call the hospital operator.  07/05/2023, 4:00 PM

## 2023-07-05 NOTE — Progress Notes (Signed)
  Inpatient Rehabilitation Admissions Coordinator   Met with patient, nurse and granddaughter at bedside for rehab assessment. We discussed goals and expectations of a possible CIR admit. They prefer CIR for rehab. Family can provide expected caregiver support that is recommended. I am hopeful for bed at CIR in the next 24 hrs for admit. I will follow up tomorrow.  Please call me with any questions.   Ottie Glazier, RN, MSN Rehab Admissions Coordinator [phone]

## 2023-07-05 NOTE — Procedures (Signed)
Joint Injection Procedure Note  PRE-OP DIAGNOSIS: left shoulder OA POST-OP DIAGNOSIS: Same  PROCEDURE: left subacromial joint injection Performing Provider: Thyra Breed, PA-C   Dose: Lidocaine 1% 5mL, Methylprednisolone acetate 40mg /52mL    Procedure: The appropriate timeout was taken. I identified and marked the appropriate anatomic landmarks to guide needle placement. The area was prepped in the usual sterile fashion and the overlying skin cleaned using isopropyl alcohol. Local anesthesia achieved using [employer] PainEase spray. 5 ml of Lidocaine 1% without epinephrine and 1ml of Depomedrol 40 mg/59mL in a needle of appropriate length and gauge was injected into the left subacromial space. There were no complications during procedure. The patient tolerated the procedure well without complications.  Standard post-procedure care has been explained.   Thompson Caul PA-C [facility] [phone] (office) [email or web address]

## 2023-07-05 NOTE — Progress Notes (Signed)
Mobility Specialist: Progress Note   07/05/23 1400  Mobility  Activity Transferred from bed to chair  Level of Assistance +2 (takes two people)  Press photographer wheel walker  Distance Ambulated (ft) 3 ft  RLE Weight Bearing TWB  Activity Response Tolerated well  Mobility Referral Yes  $Mobility charge 1 Mobility  Mobility Specialist Start Time (ACUTE ONLY) 1146  Mobility Specialist Stop Time (ACUTE ONLY) 1202  Mobility Specialist Time Calculation (min) (ACUTE ONLY) 16 min    Pt was agreeable to mobility session - received in bed. Had c/o R hip pain. ModA for bed mobility, modA +2 for STS, minA +2 for ambulation. Able to recall and adhere to WB precautions well. Left in chair with all needs met, call bell in reach.   Maurene Capes Mobility Specialist Please contact via SecureChat or Rehab office at [phone]

## 2023-07-05 NOTE — Plan of Care (Signed)
  Problem: Clinical Measurements: Goal: Ability to maintain clinical measurements within normal limits will improve Outcome: Progressing Goal: Will remain free from infection Outcome: Progressing Goal: Respiratory complications will improve Outcome: Progressing Goal: Cardiovascular complication will be avoided Outcome: Progressing   Problem: Activity: Goal: Risk for activity intolerance will decrease Outcome: Progressing   Problem: Nutrition: Goal: Adequate nutrition will be maintained Outcome: Progressing   Problem: Elimination: Goal: Will not experience complications related to bowel motility Outcome: Progressing Goal: Will not experience complications related to urinary retention Outcome: Progressing   Problem: Pain Management: Goal: General experience of comfort will improve Outcome: Progressing   Problem: Safety: Goal: Ability to remain free from injury will improve Outcome: Progressing   Problem: Skin Integrity: Goal: Risk for impaired skin integrity will decrease Outcome: Progressing

## 2023-07-05 NOTE — Progress Notes (Signed)
Orthopaedic Trauma Progress Note  SUBJECTIVE: Having pain through through right leg and pelvis this AM. York Spaniel she sat on the bedpan for about an hour which she thinks caused the increased pain. Complains of constipation, not improved with miralax. Order for Dulcolax suppository placed by primary team. Patient received pain medication (1 tab Norco) about 45 minutes ago but it hasn't really kicked in yet. She was able to mobilize some with therapies yesterday, but she doesn't feel like she did very well. She notes difficulty bearing weight through her LUE with the walker due to previous shoulder injury about 4-5 years ago. Also, still having some right knee pain. X-rays of right knee done yesterday negative for fracture but was noted to have degenerative changes through the knee joint.  No chest pain. No SOB. No nausea/vomiting. No other complaints.  Patient remains interested in CIR but concerned she may not be able to tolerate 3 hours of therapy a day.   OBJECTIVE:  Vitals:   07/05/23 0415 07/05/23 0809  BP: 112/63 115/61  Pulse: 99 94  Resp: 18 18  Temp: 98.8 F (37.1 C) 98.5 F (36.9 C)  SpO2: 98% 99%    General: Sitting up in bed, no acute distress Respiratory: No increased work of breathing.  Pelvis/right extremity: Dressing over the anterior pelvis removed, incision is clean, dry, intact.  Tenderness around the incision as expected.  No significant tenderness over the lateral hip.  Some baseline soreness throughout the thigh and calf.  Ankle DF/PF intact.  Toes warm and well-perfused.  Equal to contralateral side.  Able to wiggle toes.  Endorses sensation light touch over all aspects of the foot. + DP pulse  IMAGING: Stable post op imaging.   LABS:  Results for orders placed or performed during the hospital encounter of 06/29/23 (from the past 24 hour(s))  CBC     Status: Abnormal   Collection Time: 07/05/23  5:57 AM  Result Value Ref Range   WBC 9.8 4.0 - 10.5 K/uL   RBC 3.32 (L)  3.87 - 5.11 MIL/uL   Hemoglobin 10.2 (L) 12.0 - 15.0 g/dL   HCT 17.6 (L) 16.0 - 73.7 %   MCV 92.5 80.0 - 100.0 fL   MCH 30.7 26.0 - 34.0 pg   MCHC 33.2 30.0 - 36.0 g/dL   RDW 10.6 26.9 - 48.5 %   Platelets 134 (L) 150 - 400 K/uL   nRBC 0.0 0.0 - 0.2 %  Basic metabolic panel     Status: Abnormal   Collection Time: 07/05/23  6:21 AM  Result Value Ref Range   Sodium 130 (L) 135 - 145 mmol/L   Potassium 3.6 3.5 - 5.1 mmol/L   Chloride 96 (L) 98 - 111 mmol/L   CO2 26 22 - 32 mmol/L   Glucose, Bld 139 (H) 70 - 99 mg/dL   BUN 7 (L) 8 - 23 mg/dL   Creatinine, Ser 4.62 0.44 - 1.00 mg/dL   Calcium 7.9 (L) 8.9 - 10.3 mg/dL   GFR, Estimated >70 >35 mL/min   Anion gap 8 5 - 15    ASSESSMENT: Karen Allison is a 77 y.o. female, 2 Days Post-Op s/p OPEN REDUCTION INTERNAL FIXATION RIGHT ACETABULAR FRACTURE STOPPA  CV/Blood loss: Acute blood loss anemia, Hgb 10.2 this morning. Stable over the last 24 hours. Hemodynamically stable  PLAN: Weightbearing: TDWB RLE ROM: Unrestricted hip motion.  No posterior hip precautions needed. Incisional and dressing care: Ok to leave incision open to air Showering:  Okay to begin showering and getting incisions wet 07/06/2023 Orthopedic device(s): None  Pain management:  1. Tylenol 650 mg TID PRN 2. Robaxin 500 mg QID 3. Norco 5-325 mg (1-2 tabs) q 4 hours PRN 4. Dilaudid 1 mg q 3 hours PRN 5. Gabapentin 600 mg TID and 1200 mg QHS VTE prophylaxis: Eliquis, SCDs ID: Vancomycin post op completed Foley/Lines: No foley.  KVO IVFs Impediments to Fracture Healing: Vitamin D level low normal at 33, continued on home dose supplementation of 1000 units daily Dispo: PT/OT evaluation ongoing, recommending CIR which patient very interested in. Admission coordinator has placed CIR consult for possible admission. Patient ok for d/c to next venue from [facility]     D/C recommendations: -Hydrocodone, gabapentin, and Robaxin for pain control -Home dose  Eliquis for DVT prophylaxis -Continue 1000 units Vit D supplementation daily   Follow - up plan: 2 weeks after d/c for wound check and repeat x-rays   Contact information:  Truitt Merle MD, Thyra Breed PA-C. After hours and holidays please check [email or web address] for group call information for [facility]   Thompson Caul, PA-C [phone] (office) [email or web address]

## 2023-07-05 NOTE — PMR Pre-admission (Signed)
PMR Admission Coordinator Pre-Admission Assessment  Patient: Karen Allison is an 77 y.o., female MRN: [id number] DOB: 1946-08-18 Height: 5\' 1"  (154.9 cm) Weight: 58.8 kg  Insurance Information HMO:     PPO:      PCP:      IPA:      80/20:      OTHER:  PRIMARY: Medicare a and b      Policy#: [id number]      Subscriber: pt Benefits:  Phone #: passport one source     Name: 11/13 Eff. Date: A 01/20/2002 and b 10/1 2007     Deduct: $1632      Out of Pocket Max: none      Life Max: none CIR: 100%      SNF: 20 full days Outpatient: 80%     Co-Pay: 20% Home Health: 100%      Co-Pay: none DME: 80%     Co-Pay: 20% Providers: in network  SECONDARY: BCBS supplement      Policy#: [id number]  Financial Counselor:       Phone#:   The "Data Collection Information Summary" for patients in Inpatient Rehabilitation Facilities with attached "Privacy Act Statement-Health Care Records" was provided and verbally reviewed with: Patient  Emergency Contact Information Contact Information     Name Relation Home Work Mobile   [place] H Spouse [phone]  [phone]      Other Contacts     Name Relation Home Work [place] Daughter [phone]  [phone]   Karen Allison   [phone]      Current Medical History  Patient Admitting Diagnosis: acetabular fx, fall  History of Present Illness: 77 year old right-handed female with history of atrial fibrillation maintained on chronic Eliquis followed by Karen Allison, hypertension, 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  She used a rollator at baseline outside of the home and a straight point cane getting into the grocery store from car to cart.  Independent with ADLs.  Family with good  support.  Presented to [facility] on 06/29/2023 after mechanical fall in the kitchen while getting her coffee landing on her right side with no loss of consciousness. Transferred to [facility] on 06/29/23.   X-rays and imaging revealed comminuted fracture involving the acetabulum diffusely with displaced fragments and a component protrusion from the femoral head.  Separate fracture of the right inferior pubic ramus.  Some extraperitoneal hematoma along the right pelvic sidewall.  Admission chemistries unremarkable except WBC 13,100, sodium 131.  Hospital course patient A-fib with RVR rates in the 120s felt most likely driven by hip fracture.  She did receive IV metoprolol transition to Toprol.   latest echocardiogram from August showed ejection fraction 55 to 60%.  She was stabilized and cleared for surgery.  Patient underwent open reduction internal fixation of right acetabular fracture 07/03/2023 per Karen Allison.  Touchdown weightbearing right lower extremity.  No posterior hip precautions needed.  Her chronic Eliquis has been resumed.  Hyponatremia with latest sodium 130 with urine osmolality 206 serum osmolality 275.  Patient did have complaints of chronic left shoulder pain with follow-up per orthopedic services felt related to osteoarthritis underwent left subacromial joint injection 07/05/2023 per orthopedic services.    Patient's medical record from [facility]  has been reviewed by the rehabilitation admission coordinator and physician.  Past Medical History  Past Medical History:  Diagnosis Date   A-fib (HCC)    Acute respiratory failure with hypoxia (HCC) 05/09/2014   Adult hypothyroidism 05/03/2013   Arthritis    Blood dyscrasia    bleeds and bruises easily   Breast cancer (HCC)    left   Cervical spondylosis without myelopathy 05/06/2014   C3-4     Endometrial cancer (HCC) 01/21/1988   Fx lower humerus-closed 10/22/1997   Gait  disorder 07/05/2016   Graves' disease 07/16/2021   HCAP (healthcare-associated pneumonia) 05/09/2014   Hepatitis     Hep A years ago   History of corticosteroid therapy 02/13/2023   History of endocrine disorder 02/13/2023   HNP (herniated nucleus pulposus), cervical 05/06/2014   Hypertension    Hyponatremia 12/02/2021   Hypothyroidism    Graves disease, age 77 yo   Malignant neoplasm of left breast (HCC) 06/05/2020   Nose trouble    right nostril will hemorrhage at times   Osteoporosis 11/04/2020   Other forms of scoliosis, thoracolumbar region 11/03/2016   Pneumonia    Rib pain on left side 09/26/2022   Rosacea    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 Yes  Family History   family history includes Congestive Heart Failure in her mother; Heart disease in her father; Rheum arthritis in her daughter; Tongue cancer in her grandchild.  Current Medications  Current Facility-Administered Medications:    acetaminophen (TYLENOL) tablet 650 mg, 650 mg, Oral, TID PRN, [place] Bali, PA-C, 650 mg at 07/05/23 1445   apixaban (ELIQUIS) tablet 2.5 mg, 2.5 mg, Oral, BID, Joaquim Nam, RPH, 2.5 mg at 07/06/23 5621   bisacodyl (DULCOLAX) suppository 10 mg, 10 mg, Rectal, Daily PRN, Hongalgi, Maximino Greenland, MD   Chlorhexidine Gluconate Cloth 2 % PADS 6 each, 6 each, Topical, Q0600, Meredeth Ide, MD, 6 each at 07/06/23 3086   cholecalciferol (VITAMIN D3) 25 MCG (1000 UNIT) tablet 1,000 Units, 1,000 Units, Oral, Daily, McClung, Sarah A, PA-C   diazepam (VALIUM) tablet 10 mg, 10 mg, Oral, QHS, McClung, Sarah A, PA-C, 10 mg at 07/05/23 2150   diltiazem (CARDIZEM) tablet 30 mg, 30 mg, Oral, Q6H PRN, McClung, Sarah A, PA-C   docusate  sodium (COLACE) capsule 100 mg, 100 mg, Oral, BID, Hongalgi, Anand D, MD, 100 mg at 07/06/23 0856   doxycycline (VIBRAMYCIN) 50 MG capsule 50 mg, 50 mg, Oral, Daily, [place] Bali, PA-C, 50 mg at 07/06/23 0900   gabapentin (NEURONTIN) capsule 600 mg, 600 mg, Oral, TID, [place] Bali, PA-C, 600 mg at 07/06/23 5784   HYDROcodone-acetaminophen (NORCO/VICODIN) 5-325 MG per tablet 1-2 tablet, 1-2 tablet, Oral, Q4H PRN, [place]ali, PA-C, 1 tablet at 07/06/23 1008   HYDROmorphone (DILAUDID) injection 0.5 mg, 0.5 mg, Intravenous, Q2H PRN, [place] Bali, PA-C   levothyroxine (SYNTHROID) tablet 50 mcg, 50 mcg, Oral, Q0600, [place] Bali, PA-C, 50 mcg at 07/06/23 0554   methocarbamol (ROBAXIN) tablet 500 mg, 500 mg, Oral, QID, McClung, Sarah A, PA-C, 500 mg at 07/06/23 0856   metoCLOPramide (REGLAN) tablet 5-10 mg, 5-10 mg, Oral, Q8H PRN **OR** metoCLOPramide (  REGLAN) injection 5-10 mg, 5-10 mg, Intravenous, Q8H PRN, [place] Bali, PA-C   metoprolol succinate (TOPROL-XL) 24 hr tablet 50 mg, 50 mg, Oral, QHS, McClung, Sarah A, PA-C, 50 mg at 07/05/23 2150   ondansetron (ZOFRAN) tablet 4 mg, 4 mg, Oral, Q6H PRN **OR** ondansetron (ZOFRAN) injection 4 mg, 4 mg, Intravenous, Q6H PRN, McClung, Sarah A, PA-C   pantoprazole (PROTONIX) EC tablet 40 mg, 40 mg, Oral, Daily, [place] Bali, PA-C, 40 mg at 07/06/23 1610   pentafluoroprop-tetrafluoroeth (GEBAUERS) aerosol, , Topical, PRN, [place] Bali, PA-C   rosuvastatin (CRESTOR) tablet 10 mg, 10 mg, Oral, Q Sun, McClung, Sarah A, PA-C, 10 mg at 07/02/23 9604   senna-docusate (Senokot-S) tablet 2 tablet, 2 tablet, Oral, BID, Hongalgi, Anand D, MD, 2 tablet at 07/06/23 0856   tamsulosin (FLOMAX) capsule 0.4 mg, 0.4 mg, Oral, QPM, McClung, Sarah A, PA-C, 0.4 mg at 07/05/23 1739  Patients Current Diet:  Diet Order             Diet regular Room service appropriate? Yes; Fluid consistency: Thin  Diet effective now                   Precautions / Restrictions Precautions Precautions: Fall Restrictions Weight Bearing Restrictions: Yes RLE Weight Bearing: Touchdown weight bearing   Has the patient had 2 or more falls or a fall with injury in the past year? Yes  Prior Activity Level Community (5-7x/wk): Mod I with rollator  Prior Functional Level Self Care: Did the patient need help bathing, dressing, using the toilet or eating? Independent  Indoor Mobility: Did the patient need assistance with walking from room to room (with or without device)? Independent  Stairs: Did the patient need assistance with internal or external stairs (with or without device)? Independent  Functional Cognition: Did the patient need help planning regular tasks such as shopping or remembering to take medications? Independent  Patient Information Are you of Hispanic, Latino/a,or Spanish origin?: A. No, not of Hispanic, Latino/a, or Spanish origin What is your race?: A. White Do you need or want an interpreter to communicate with a doctor or health care staff?: 0. No  Patient's Response To:  Health Literacy and Transportation Is the patient able to respond to health literacy and transportation needs?: Yes Health Literacy - How often do you need to have someone help you when you read instructions, pamphlets, or other written material from your doctor or pharmacy?: Never In the past 12 months, has lack of transportation kept you from medical appointments or from getting medications?: No In the past 12 months, has lack of transportation kept you from meetings, work, or from getting things needed for daily living?: No  Home Assistive Devices / Equipment Home Equipment: Tub bench, Shower seat, BSC/3in1, Rollator (4 wheels), Cane - single point  Prior Device Use: Indicate devices/aids used by the patient prior to current illness, exacerbation or injury? Walker  Current Functional Level Cognition  Overall Cognitive Status: Within  Functional Limits for tasks assessed Current Attention Level: Sustained Orientation Level: Oriented X4 Following Commands: Follows one step commands consistently, Follows one step commands with increased time General Comments: patient oriented and following commands, good awareness to deficits but some decreased attention to task requring redirection- maybe baseline    Extremity Assessment (includes Sensation/Coordination)  Upper Extremity Assessment: Defer to OT evaluation LUE Deficits / Details: hx of L humerus ORIF  Lower Extremity Assessment: RLE deficits/detail RLE Deficits / Details: Acute pain, decreased strength and  AROM consistent with above mentioned injury. RLE: Unable to fully assess due to pain    ADLs  Overall ADL's : Needs assistance/impaired Grooming: Set up, Sitting Upper Body Dressing : Set up, Sitting Lower Body Dressing: Total assistance, +2 for physical assistance, Sit to/from stand Toilet Transfer: Moderate assistance, Maximal assistance, +2 for physical assistance, Rolling walker (2 wheels) Toilet Transfer Details (indicate cue type and reason): stand pivot to recliner Functional mobility during ADLs: Moderate assistance, Maximal assistance, +2 for physical assistance, Rolling walker (2 wheels)    Mobility  Overal bed mobility: Needs Assistance Bed Mobility: Supine to Sit Supine to sit: Mod assist, +2 for safety/equipment General bed mobility comments: Pt was received sitting up in the recliner.    Transfers  Overall transfer level: Needs assistance Equipment used: Rolling walker (2 wheels) Transfers: Sit to/from Stand, Bed to chair/wheelchair/BSC Sit to Stand: +2 physical assistance, Mod assist Bed to/from chair/wheelchair/BSC transfer type:: Step pivot Step pivot transfers: Mod assist, +2 physical assistance General transfer comment: Increased time. VC's for hand placement on seated surface for safety.    Ambulation / Gait / Stairs / Wheelchair  Mobility  Ambulation/Gait Ambulation/Gait assistance: Min assist, +2 safety/equipment Gait Distance (Feet): 15 Feet Assistive device: Rolling walker (2 wheels) Gait Pattern/deviations: Step-to pattern, Decreased stride length, Trunk flexed, Narrow base of support General Gait Details: VC's for improved posture, closer walker proximity and forward gaze. Pt appearing to maintain TDWB status well however VC's throughout for reminders. Gait velocity: Decreased Gait velocity interpretation: <1.31 ft/sec, indicative of household ambulator    Posture / Balance Balance Overall balance assessment: Needs assistance Sitting-balance support: No upper extremity supported, Feet supported Sitting balance-Leahy Scale: Fair Standing balance support: Bilateral upper extremity supported, During functional activity, Reliant on assistive device for balance Standing balance-Leahy Scale: Poor Standing balance comment: relies on BUE and external support    Special needs/care consideration Fall precautions   Previous Home Environment  Living Arrangements: Spouse/significant other  Lives With: Spouse Available Help at Discharge: Family, Available 24 hours/day (including dtr and grand dtr, [place]) Type of Home: House Home Layout: Two level, Able to live on main level with bedroom/bathroom Home Access: Stairs to enter, Ramped entrance Entrance Stairs-Rails: Left Entrance Stairs-Number of Steps: 2 Bathroom Shower/Tub: Health visitor: Standard Bathroom Accessibility: Yes How Accessible: Accessible via walker Home Care Services: No Additional Comments: Chair lift  Discharge Living Setting Plans for Discharge Living Setting: Patient's home, Lives with (comment) (spouse) Type of Home at Discharge: House Discharge Home Layout: Two level, Able to live on main level with bedroom/bathroom Discharge Home Access: Stairs to enter Entrance Stairs-Rails: Left Entrance Stairs-Number of Steps:  2 Discharge Bathroom Shower/Tub: Walk-in shower Discharge Bathroom Toilet: Standard Discharge Bathroom Accessibility: Yes How Accessible: Accessible via walker Does the patient have any problems obtaining your medications?: No  Social/Family/Support Systems Patient Roles: Spouse Contact Information: spouse, Jomarie Longs and dtr, Idalia Needle Anticipated Caregiver: spouse, dtr and granddtr Anticipated Caregiver's Contact Information: see contacts Ability/Limitations of Caregiver: no limitations Caregiver Availability: 24/7 Discharge Plan Discussed with Primary Caregiver: Yes Is Caregiver In Agreement with Plan?: Yes Does Caregiver/Family have Issues with Lodging/Transportation while Pt is in Rehab?: No  Goals Patient/Family Goal for Rehab: mod i to Supervision with PT and OT Expected length of stay: ELOS 10 to 12 days Pt/Family Agrees to Admission and willing to participate: Yes Program Orientation Provided & Reviewed with Pt/Caregiver Including Roles  & Responsibilities: Yes  Decrease burden of Care through IP rehab admission: n/a  Possible need for SNF placement upon discharge: not anticipated  Patient Condition: I have reviewed medical records from [place] and [facility], spoken with patient and family member. I met with patient at the bedside for inpatient rehabilitation assessment.  Patient will benefit from ongoing PT and OT, can actively participate in 3 hours of therapy a day 5 days of the week, and can make measurable gains during the admission.  Patient will also benefit from the coordinated team approach during an Inpatient Acute Rehabilitation admission.  The patient will receive intensive therapy as well as Rehabilitation physician, nursing, social worker, and care management interventions.  Due to bladder management, bowel management, safety, skin/wound care, disease management, medication administration, pain management, and patient education the patient requires 24 hour a  day rehabilitation nursing.  The patient is currently min assist with mobility and basic ADLs.  Discharge setting and therapy post discharge at home with home health is anticipated.  Patient has agreed to participate in the Acute Inpatient Rehabilitation Program and will admit today.  Preadmission Screen Completed By:  Clois Dupes, RN MSN 07/06/2023 10:38 AM ______________________________________________________________________   Discussed status with Dr. Berline Chough on 07/06/23 at 1038 and received approval for admission today.  Admission Coordinator:  Clois Dupes, RN MSN time [id number] Date 07/06/23   Assessment/Plan: Diagnosis: R acetabular fx s/p ORIF Does the need for close, 24 hr/day Medical supervision in concert with the patient's rehab needs make it unreasonable for this patient to be served in a less intensive setting? Yes Co-Morbidities requiring supervision/potential complications: Inferior pubic rami fx; OIC; Afib with RVR; stage IV rbeast CA; GERD/L shoulder pain; got injections Due to bladder management, bowel management, safety, skin/wound care, disease management, medication administration, pain management, and patient education, does the patient require 24 hr/day rehab nursing? Yes Does the patient require coordinated care of a physician, rehab nurse, PT, OT, and SLP to address physical and functional deficits in the context of the above medical diagnosis(es)? Yes Addressing deficits in the following areas: balance, endurance, locomotion, strength, transferring, bowel/bladder control, bathing, dressing, feeding, grooming, and toileting Can the patient actively participate in an intensive therapy program of at least 3 hrs of therapy 5 days a week? Yes The potential for patient to make measurable gains while on inpatient rehab is good Anticipated functional outcomes upon discharge from inpatient rehab: modified independent and supervision PT, modified independent and  supervision OT, n/a SLP Estimated rehab length of stay to reach the above functional goals is: 10-12 days Anticipated discharge destination: Home 10. Overall Rehab/Functional Prognosis: good   MD Signature:

## 2023-07-05 NOTE — Progress Notes (Signed)
Pt c/o constipation. Miralax was given last night, but pt stated it wasn't working. On-call provider notified. An order received for dulcolax suppository. Suppository given per order. Will continue to monitor.

## 2023-07-06 ENCOUNTER — Encounter (HOSPITAL_COMMUNITY): Payer: Self-pay | Admitting: Physical Medicine and Rehabilitation

## 2023-07-06 ENCOUNTER — Other Ambulatory Visit: Payer: Self-pay

## 2023-07-06 ENCOUNTER — Inpatient Hospital Stay (HOSPITAL_COMMUNITY)
Admission: AD | Admit: 2023-07-06 | Discharge: 2023-07-19 | DRG: 560 | Disposition: A | Discharge status: Home Health | Payer: Medicare Other | Source: Intra-hospital | Attending: Physical Medicine and Rehabilitation | Admitting: Physical Medicine and Rehabilitation

## 2023-07-06 DIAGNOSIS — S32401D Unspecified fracture of right acetabulum, subsequent encounter for fracture with routine healing: Secondary | ICD-10-CM | POA: Diagnosis not present

## 2023-07-06 DIAGNOSIS — C787 Secondary malignant neoplasm of liver and intrahepatic bile duct: Secondary | ICD-10-CM | POA: Diagnosis present

## 2023-07-06 DIAGNOSIS — S32421A Displaced fracture of posterior wall of right acetabulum, initial encounter for closed fracture: Secondary | ICD-10-CM

## 2023-07-06 DIAGNOSIS — M810 Age-related osteoporosis without current pathological fracture: Secondary | ICD-10-CM | POA: Diagnosis present

## 2023-07-06 DIAGNOSIS — I480 Paroxysmal atrial fibrillation: Secondary | ICD-10-CM | POA: Diagnosis not present

## 2023-07-06 DIAGNOSIS — E871 Hypo-osmolality and hyponatremia: Secondary | ICD-10-CM | POA: Diagnosis present

## 2023-07-06 DIAGNOSIS — L719 Rosacea, unspecified: Secondary | ICD-10-CM | POA: Diagnosis present

## 2023-07-06 DIAGNOSIS — Z9221 Personal history of antineoplastic chemotherapy: Secondary | ICD-10-CM

## 2023-07-06 DIAGNOSIS — Z8249 Family history of ischemic heart disease and other diseases of the circulatory system: Secondary | ICD-10-CM

## 2023-07-06 DIAGNOSIS — Z981 Arthrodesis status: Secondary | ICD-10-CM

## 2023-07-06 DIAGNOSIS — K219 Gastro-esophageal reflux disease without esophagitis: Secondary | ICD-10-CM | POA: Diagnosis present

## 2023-07-06 DIAGNOSIS — M62838 Other muscle spasm: Secondary | ICD-10-CM | POA: Diagnosis not present

## 2023-07-06 DIAGNOSIS — I4891 Unspecified atrial fibrillation: Secondary | ICD-10-CM | POA: Diagnosis present

## 2023-07-06 DIAGNOSIS — K5901 Slow transit constipation: Secondary | ICD-10-CM | POA: Diagnosis not present

## 2023-07-06 DIAGNOSIS — M25512 Pain in left shoulder: Secondary | ICD-10-CM | POA: Diagnosis not present

## 2023-07-06 DIAGNOSIS — Z8542 Personal history of malignant neoplasm of other parts of uterus: Secondary | ICD-10-CM

## 2023-07-06 DIAGNOSIS — G8929 Other chronic pain: Secondary | ICD-10-CM | POA: Diagnosis present

## 2023-07-06 DIAGNOSIS — Z90710 Acquired absence of both cervix and uterus: Secondary | ICD-10-CM

## 2023-07-06 DIAGNOSIS — E890 Postprocedural hypothyroidism: Secondary | ICD-10-CM | POA: Diagnosis present

## 2023-07-06 DIAGNOSIS — E785 Hyperlipidemia, unspecified: Secondary | ICD-10-CM | POA: Diagnosis present

## 2023-07-06 DIAGNOSIS — Z87891 Personal history of nicotine dependence: Secondary | ICD-10-CM

## 2023-07-06 DIAGNOSIS — K5900 Constipation, unspecified: Secondary | ICD-10-CM | POA: Diagnosis not present

## 2023-07-06 DIAGNOSIS — G4700 Insomnia, unspecified: Secondary | ICD-10-CM | POA: Diagnosis present

## 2023-07-06 DIAGNOSIS — K5909 Other constipation: Secondary | ICD-10-CM | POA: Diagnosis present

## 2023-07-06 DIAGNOSIS — M25551 Pain in right hip: Secondary | ICD-10-CM | POA: Diagnosis not present

## 2023-07-06 DIAGNOSIS — I959 Hypotension, unspecified: Secondary | ICD-10-CM | POA: Diagnosis not present

## 2023-07-06 DIAGNOSIS — Z881 Allergy status to other antibiotic agents status: Secondary | ICD-10-CM

## 2023-07-06 DIAGNOSIS — I10 Essential (primary) hypertension: Secondary | ICD-10-CM | POA: Diagnosis present

## 2023-07-06 DIAGNOSIS — W19XXXD Unspecified fall, subsequent encounter: Secondary | ICD-10-CM | POA: Diagnosis present

## 2023-07-06 DIAGNOSIS — M19012 Primary osteoarthritis, left shoulder: Secondary | ICD-10-CM | POA: Diagnosis present

## 2023-07-06 DIAGNOSIS — S32409A Unspecified fracture of unspecified acetabulum, initial encounter for closed fracture: Principal | ICD-10-CM | POA: Diagnosis present

## 2023-07-06 DIAGNOSIS — C50912 Malignant neoplasm of unspecified site of left female breast: Secondary | ICD-10-CM | POA: Diagnosis present

## 2023-07-06 DIAGNOSIS — I4892 Unspecified atrial flutter: Secondary | ICD-10-CM | POA: Diagnosis present

## 2023-07-06 DIAGNOSIS — Z7901 Long term (current) use of anticoagulants: Secondary | ICD-10-CM

## 2023-07-06 DIAGNOSIS — Z79899 Other long term (current) drug therapy: Secondary | ICD-10-CM

## 2023-07-06 DIAGNOSIS — C7800 Secondary malignant neoplasm of unspecified lung: Secondary | ICD-10-CM | POA: Diagnosis present

## 2023-07-06 DIAGNOSIS — S32591D Other specified fracture of right pubis, subsequent encounter for fracture with routine healing: Secondary | ICD-10-CM

## 2023-07-06 DIAGNOSIS — R339 Retention of urine, unspecified: Secondary | ICD-10-CM | POA: Diagnosis present

## 2023-07-06 DIAGNOSIS — Z79890 Hormone replacement therapy (postmenopausal): Secondary | ICD-10-CM

## 2023-07-06 DIAGNOSIS — M79661 Pain in right lower leg: Secondary | ICD-10-CM | POA: Diagnosis not present

## 2023-07-06 DIAGNOSIS — Y92000 Kitchen of unspecified non-institutional (private) residence as  the place of occurrence of the external cause: Secondary | ICD-10-CM | POA: Diagnosis not present

## 2023-07-06 DIAGNOSIS — E876 Hypokalemia: Secondary | ICD-10-CM | POA: Diagnosis not present

## 2023-07-06 DIAGNOSIS — R600 Localized edema: Secondary | ICD-10-CM | POA: Diagnosis not present

## 2023-07-06 LAB — BASIC METABOLIC PANEL
Anion gap: 6 (ref 5–15)
BUN: 8 mg/dL (ref 8–23)
CO2: 27 mmol/L (ref 22–32)
Calcium: 7.9 mg/dL — ABNORMAL LOW (ref 8.9–10.3)
Chloride: 93 mmol/L — ABNORMAL LOW (ref 98–111)
Creatinine, Ser: 0.88 mg/dL (ref 0.44–1.00)
GFR, Estimated: >60 mL/min (ref >60)
Glucose, Bld: 194 mg/dL — ABNORMAL HIGH (ref 70–99)
Potassium: 3.6 mmol/L (ref 3.5–5.1)
Sodium: 126 mmol/L — ABNORMAL LOW (ref 135–145)

## 2023-07-06 LAB — CBC
HCT: 31.1 % — ABNORMAL LOW (ref 36.0–46.0)
Hemoglobin: 10 g/dL — ABNORMAL LOW (ref 12.0–15.0)
MCH: 30.5 pg (ref 26.0–34.0)
MCHC: 32.2 g/dL (ref 30.0–36.0)
MCV: 94.8 fL (ref 80.0–100.0)
Platelets: 175 10*3/uL (ref 150–400)
RBC: 3.28 MIL/uL — ABNORMAL LOW (ref 3.87–5.11)
RDW: 13.1 % (ref 11.5–15.5)
WBC: 9.7 10*3/uL (ref 4.0–10.5)
nRBC: 0 % (ref 0.0–0.2)

## 2023-07-06 LAB — MAGNESIUM: Magnesium: 1.7 mg/dL (ref 1.7–2.4)

## 2023-07-06 MED ORDER — DOCUSATE SODIUM 100 MG PO CAPS: 100.0000 mg | ORAL_CAPSULE | Freq: Two times a day (BID) | ORAL | Status: DC

## 2023-07-06 MED ORDER — GABAPENTIN 300 MG PO CAPS
600.0000 mg | ORAL_CAPSULE | Freq: Three times a day (TID) | ORAL | Status: DC
  Administered 2023-07-06 – 2023-07-10 (×12): 600 mg via ORAL
  Filled 2023-07-06 (×12): qty 2

## 2023-07-06 MED ORDER — LEVOTHYROXINE SODIUM 50 MCG PO TABS
50.0000 ug | ORAL_TABLET | Freq: Every day | ORAL | Status: DC
  Administered 2023-07-07 – 2023-07-19 (×13): 50 ug via ORAL
  Filled 2023-07-06 (×13): qty 1

## 2023-07-06 MED ORDER — METHOCARBAMOL 750 MG PO TABS
750.0000 mg | ORAL_TABLET | Freq: Four times a day (QID) | ORAL | Status: DC
  Administered 2023-07-06 – 2023-07-17 (×39): 750 mg via ORAL
  Filled 2023-07-06 (×42): qty 1

## 2023-07-06 MED ORDER — PANTOPRAZOLE SODIUM 40 MG PO TBEC
40.0000 mg | DELAYED_RELEASE_TABLET | Freq: Every day | ORAL | Status: DC
  Administered 2023-07-07 – 2023-07-10 (×4): 40 mg via ORAL
  Filled 2023-07-06 (×4): qty 1

## 2023-07-06 MED ORDER — HYDROCODONE-ACETAMINOPHEN 5-325 MG PO TABS
1.0000 | ORAL_TABLET | ORAL | Status: DC | PRN
  Administered 2023-07-06: 1 via ORAL
  Administered 2023-07-06: 2 via ORAL
  Administered 2023-07-06 – 2023-07-07 (×2): 1 via ORAL
  Administered 2023-07-07: 2 via ORAL
  Administered 2023-07-07: 1 via ORAL
  Administered 2023-07-07: 2 via ORAL
  Administered 2023-07-08 (×2): 1 via ORAL
  Administered 2023-07-08 (×3): 2 via ORAL
  Administered 2023-07-09: 1 via ORAL
  Administered 2023-07-09: 2 via ORAL
  Administered 2023-07-09: 1 via ORAL
  Administered 2023-07-09 – 2023-07-10 (×3): 2 via ORAL
  Administered 2023-07-10: 1 via ORAL
  Administered 2023-07-10 (×2): 2 via ORAL
  Administered 2023-07-10 – 2023-07-11 (×3): 1 via ORAL
  Filled 2023-07-06 (×6): qty 2
  Filled 2023-07-06 (×2): qty 1
  Filled 2023-07-06: qty 2
  Filled 2023-07-06 (×2): qty 1
  Filled 2023-07-06 (×4): qty 2
  Filled 2023-07-06 (×3): qty 1
  Filled 2023-07-06 (×4): qty 2
  Filled 2023-07-06: qty 1
  Filled 2023-07-06 (×2): qty 2
  Filled 2023-07-06: qty 1
  Filled 2023-07-06 (×2): qty 2

## 2023-07-06 MED ORDER — ONDANSETRON HCL 4 MG/2ML IJ SOLN
4.0000 mg | Freq: Four times a day (QID) | INTRAMUSCULAR | Status: DC | PRN
Start: 2023-07-06 — End: 2023-07-19

## 2023-07-06 MED ORDER — DIAZEPAM 5 MG PO TABS
10.0000 mg | ORAL_TABLET | Freq: Every day | ORAL | Status: DC
  Administered 2023-07-06 – 2023-07-18 (×13): 10 mg via ORAL
  Filled 2023-07-06 (×13): qty 2

## 2023-07-06 MED ORDER — APIXABAN 2.5 MG PO TABS
2.5000 mg | ORAL_TABLET | Freq: Two times a day (BID) | ORAL | Status: DC
  Administered 2023-07-06 – 2023-07-10 (×8): 2.5 mg via ORAL
  Filled 2023-07-06 (×8): qty 1

## 2023-07-06 MED ORDER — SENNOSIDES-DOCUSATE SODIUM 8.6-50 MG PO TABS
2.0000 | ORAL_TABLET | Freq: Two times a day (BID) | ORAL | Status: DC
  Administered 2023-07-06 – 2023-07-10 (×6): 2 via ORAL
  Filled 2023-07-06 (×8): qty 2

## 2023-07-06 MED ORDER — OMEPRAZOLE 20 MG PO CPDR: 20.0000 mg | DELAYED_RELEASE_CAPSULE | Freq: Every day | ORAL | Status: DC

## 2023-07-06 MED ORDER — ACETAMINOPHEN 325 MG PO TABS: 650.0000 mg | ORAL_TABLET | Freq: Three times a day (TID) | ORAL | Status: AC | PRN

## 2023-07-06 MED ORDER — METHOCARBAMOL 500 MG PO TABS
500.0000 mg | ORAL_TABLET | Freq: Four times a day (QID) | ORAL | Status: DC
  Administered 2023-07-06: 500 mg via ORAL
  Filled 2023-07-06: qty 1

## 2023-07-06 MED ORDER — METOPROLOL SUCCINATE ER 50 MG PO TB24
50.0000 mg | ORAL_TABLET | Freq: Every day | ORAL | Status: DC
  Administered 2023-07-06 – 2023-07-18 (×13): 50 mg via ORAL
  Filled 2023-07-06 (×13): qty 1

## 2023-07-06 MED ORDER — ROSUVASTATIN CALCIUM 5 MG PO TABS
10.0000 mg | ORAL_TABLET | ORAL | Status: DC
  Administered 2023-07-09 – 2023-07-16 (×2): 10 mg via ORAL
  Filled 2023-07-06 (×2): qty 2

## 2023-07-06 MED ORDER — ONDANSETRON HCL 4 MG PO TABS: 4.0000 mg | ORAL_TABLET | Freq: Four times a day (QID) | ORAL | Status: DC | PRN

## 2023-07-06 MED ORDER — BISACODYL 10 MG RE SUPP
10.0000 mg | Freq: Every day | RECTAL | Status: DC | PRN
Start: 2023-07-06 — End: 2023-07-19

## 2023-07-06 MED ORDER — DOCUSATE SODIUM 100 MG PO CAPS
100.0000 mg | ORAL_CAPSULE | Freq: Two times a day (BID) | ORAL | Status: DC
  Administered 2023-07-06 – 2023-07-11 (×9): 100 mg via ORAL
  Filled 2023-07-06 (×10): qty 1

## 2023-07-06 MED ORDER — TAMSULOSIN HCL 0.4 MG PO CAPS
0.4000 mg | ORAL_CAPSULE | Freq: Every evening | ORAL | Status: DC
  Administered 2023-07-06 – 2023-07-18 (×13): 0.4 mg via ORAL
  Filled 2023-07-06 (×13): qty 1

## 2023-07-06 MED ORDER — DOXYCYCLINE HYCLATE 50 MG PO CAPS
50.0000 mg | ORAL_CAPSULE | Freq: Every day | ORAL | Status: DC
  Administered 2023-07-07 – 2023-07-19 (×13): 50 mg via ORAL
  Filled 2023-07-06 (×13): qty 1

## 2023-07-06 MED ORDER — VITAMIN D 25 MCG (1000 UNIT) PO TABS
1000.0000 [IU] | ORAL_TABLET | Freq: Every day | ORAL | Status: DC
  Administered 2023-07-07 – 2023-07-19 (×13): 1000 [IU] via ORAL
  Filled 2023-07-06 (×13): qty 1

## 2023-07-06 MED ORDER — ACETAMINOPHEN 325 MG PO TABS
650.0000 mg | ORAL_TABLET | Freq: Three times a day (TID) | ORAL | Status: DC | PRN
  Administered 2023-07-09 – 2023-07-16 (×5): 650 mg via ORAL
  Filled 2023-07-06 (×8): qty 2

## 2023-07-06 MED ORDER — ESTRADIOL 0.1 MG/GM VA CREA: 1.0000 | TOPICAL_CREAM | VAGINAL | Status: DC

## 2023-07-06 MED ORDER — SENNA-DOCUSATE SODIUM 8.6-50 MG PO TABS: 2.0000 | ORAL_TABLET | Freq: Two times a day (BID) | ORAL | Status: AC

## 2023-07-06 MED ORDER — DILTIAZEM HCL 30 MG PO TABS: 30.0000 mg | ORAL_TABLET | Freq: Four times a day (QID) | ORAL | Status: DC | PRN

## 2023-07-06 MED ORDER — BISACODYL 10 MG RE SUPP: 10.0000 mg | Freq: Every day | RECTAL | Status: DC | PRN

## 2023-07-06 MED ORDER — METOPROLOL SUCCINATE ER 25 MG PO TB24: 50.0000 mg | ORAL_TABLET | Freq: Every day | ORAL | Status: DC

## 2023-07-06 NOTE — H&P (Signed)
Physical Medicine and Rehabilitation Admission H&P        Chief Complaint  Patient presents with   Fall  : HPI: Karen Allison. Heis is a 77 year old right-handed female with history of atrial fibrillation maintained on chronic Eliquis followed by Dr. 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 Gery Pray of oncology services and receives trastuzumab every 3 weeks, hypothyroidism, ACDF with fusion 05/06/2014, quit smoking 61 years ago.  Per chart review patient lives with spouse.  Two-level home with bed and bath on main level with ramped entrance.  She used a rollator at baseline outside of the home and a straight point cane getting into the grocery store from car to cart.  Independent with ADLs.  Family with good support.  Presented 06/29/2023 after mechanical fall in the kitchen while getting her coffee landing on her right side with no loss of consciousness.  X-rays and imaging revealed comminuted fracture involving the acetabulum diffusely with displaced fragments and a component protrusion from the femoral head.  Separate fracture of the right inferior pubic ramus.  Some extraperitoneal hematoma along the right pelvic sidewall.  Admission chemistries unremarkable except WBC 13,100, sodium 131.  Hospital course patient A-fib with RVR rates in the 120s felt most likely driven by hip fracture.  She did receive IV metoprolol transition to Toprol.   latest echocardiogram from August showed ejection fraction 55 to 60%.  She was stabilized and cleared for surgery.  Patient underwent open reduction internal fixation of right acetabular fracture 07/03/2023 per Dr. Caryn Bee Haddix.  Touchdown weightbearing right lower extremity.  No posterior hip precautions needed.  Her chronic Eliquis has been resumed.  Hyponatremia with latest sodium 130 with urine osmolality 206 serum osmolality 275.  Patient did have complaints  of chronic left shoulder pain with follow-up per orthopedic services felt related to osteoarthritis underwent left subacromial joint injection 07/05/2023 per orthopedic services.  Therapy evaluations completed due to patient decreased functional mobility was admitted for a comprehensive rehab program.   Pt reports LBM yesterday with enema- no real results prior with suppositories.    Pain- uncomfortable currently- spikes with movement.  Mainly muscle spasms right now- down leg to knee on R.  Robaxin helps, not enough.      Review of Systems  Constitutional:  Negative for chills and fever.  HENT:  Negative for hearing loss.   Eyes:  Negative for blurred vision and double vision.  Respiratory:  Negative for cough, shortness of breath and wheezing.   Cardiovascular:  Positive for palpitations and leg swelling. Negative for chest pain.  Gastrointestinal:  Positive for constipation. Negative for heartburn, nausea and vomiting.  Genitourinary:  Negative for dysuria, flank pain and hematuria.       Intermittent bouts of urinary retention  Musculoskeletal:  Positive for joint pain, myalgias and neck pain.       Chronic right shoulder pain  Skin:        Rosacea  Psychiatric/Behavioral:  The patient has insomnia.   All other systems reviewed and are negative.       Past Medical History:  Diagnosis Date   A-fib [facility])     Acute respiratory failure with hypoxia (HCC) 05/09/2014   Adult hypothyroidism 05/03/2013   Arthritis     Blood dyscrasia      bleeds and bruises easily   Breast cancer (HCC)      left  Cervical spondylosis without myelopathy 05/06/2014    C3-4     Endometrial cancer (HCC) 01/21/1988   Fx lower humerus-closed 10/22/1997   Gait disorder 07/05/2016   Graves' disease 07/16/2021   HCAP (healthcare-associated pneumonia) 05/09/2014   Hepatitis       Hep A years ago   History of corticosteroid therapy 02/13/2023   History of endocrine disorder 02/13/2023   HNP  (herniated nucleus pulposus), cervical 05/06/2014   Hypertension     Hyponatremia 12/02/2021   Hypothyroidism      Graves disease, age 34 yo   Malignant neoplasm of left breast (HCC) 06/05/2020   Nose trouble      right nostril will hemorrhage at times   Osteoporosis 11/04/2020   Other forms of scoliosis, thoracolumbar region 11/03/2016   Pneumonia     Rib pain on left side 09/26/2022   Rosacea     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    Overview:   Due to steroids and resolved with normal cortisol stimulation in 2011     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HCC)     Spinal stenosis in cervical region 05/06/2014    C3-4               Past Surgical History:  Procedure Laterality Date   ABDOMINAL HYSTERECTOMY        Age 13   ANTERIOR CERVICAL DECOMP/DISCECTOMY FUSION N/A 05/06/2014    Procedure: ANTERIOR CERVICAL DISCECTOMY FUSION C3-4 with transgraft, local bone graft, plate and screws;  Surgeon: Kerrin Champagne, MD;  Location: [facility] OR;  Service: Orthopedics;  Laterality: N/A;   APPENDECTOMY       BREAST LUMPECTOMY Left     COLONOSCOPY       EYE SURGERY Bilateral      cataracts   LUMBAR SPINE SURGERY   2011    Removal of hematoma   OPEN REDUCTION INTERNAL FIXATION ACETABULAR FRACTURE STOPPA Right 07/03/2023    Procedure: OPEN REDUCTION INTERNAL FIXATION ACETABULAR FRACTURE STOPPA;  Surgeon: Roby Lofts, MD;  Location: [facility] OR;  Service: Orthopedics;  Laterality: Right;   ORIF HUMERUS FRACTURE Left 2013   THORACIC SPINE SURGERY   2011    Bone cages   THYROIDECTOMY       TONSILLECTOMY                 Family History  Problem Relation Age of Onset   Congestive Heart Failure Mother     Heart disease Father     Rheum arthritis Daughter     Tongue cancer Grandchild     Breast cancer Neg Hx          Social History:  reports that she quit smoking about 61 years ago. Her smoking use included cigarettes. She has never used smokeless tobacco. She reports that she does  not currently use alcohol. She reports that she does not use drugs. Allergies:  Allergies      Allergies  Allergen Reactions   Cefuroxime Hives   Zithromax [Azithromycin] Hives            Medications Prior to Admission  Medication Sig Dispense Refill   apixaban (ELIQUIS) 5 MG TABS tablet Take 1/2 tablet twice daily (Patient taking differently: Take 2.5 mg by mouth 2 (two) times daily.)       B Complex Vitamins (B COMPLEX PO) Take 1 tablet by mouth daily.       Calcium Carbonate-Vitamin D (CALTRATE 600+D PO) Take 1 tablet by mouth 2 (two)  times daily.       celecoxib (CELEBREX) 200 MG capsule Take 200 mg by mouth daily.        cholecalciferol (VITAMIN D3) 25 MCG (1000 UNIT) tablet Take 1,000 Units by mouth daily.       diazepam (VALIUM) 10 MG tablet Take 10 mg by mouth at bedtime.        docusate sodium (COLACE) 100 MG capsule Take 100 mg by mouth daily.       doxycycline (VIBRAMYCIN) 50 MG capsule Take 50 mg by mouth daily.       estradiol (ESTRACE) 0.1 MG/GM vaginal cream Place 0.5 g vaginally 2 (two) times a week. Place 0.5g nightly for two weeks then twice a week after (Patient taking differently: Place 1 Applicatorful vaginally 2 (two) times a week.) 42 g 11   fluconazole (DIFLUCAN) 150 MG tablet Take 150 mg by mouth as needed (yeast).       furosemide (LASIX) 20 MG tablet Take 20 mg by mouth as needed for edema or fluid.       gabapentin (NEURONTIN) 600 MG tablet Take 600 mg by mouth 3 (three) times daily. May take an additional 600 mg at bedtimes as needed for nerve pain       HYDROcodone-acetaminophen (NORCO) 10-325 MG tablet Take 0.5-1 tablets by mouth 3 (three) times daily as needed for severe pain (pain score 7-10) or moderate pain (pain score 4-6).       levothyroxine (SYNTHROID, LEVOTHROID) 50 MCG tablet Take 50 mcg by mouth daily.       lidocaine-prilocaine (EMLA) cream Apply 1 Application topically as needed. (Patient taking differently: Apply 1 Application topically as  needed (pain).) 30 g 0   methocarbamol (ROBAXIN) 500 MG tablet Take 500 mg by mouth in the morning and at bedtime.       metoprolol succinate (TOPROL-XL) 25 MG 24 hr tablet Take 1 tablet (25 mg total) by mouth daily. 90 tablet 3   metroNIDAZOLE (METROCREAM) 0.75 % cream Apply 1 Application topically as needed (rocesea).       Multiple Vitamin (MULTIVITAMIN WITH MINERALS) TABS Take 1 tablet by mouth daily.       omeprazole (PRILOSEC) 20 MG capsule Take 1 capsule (20 mg total) by mouth 2 (two) times daily before a meal. (Patient taking differently: Take 20 mg by mouth daily.) 60 capsule 3   ondansetron (ZOFRAN) 8 MG tablet Take 8 mg by mouth as needed for nausea or vomiting.       rosuvastatin (CRESTOR) 10 MG tablet Take 10 mg by mouth once a week.       sennosides-docusate sodium (SENOKOT-S) 8.6-50 MG tablet Take 3 tablets by mouth at bedtime.       tamsulosin (FLOMAX) 0.4 MG CAPS capsule Take 0.4 mg by mouth every evening.       trastuzumab (HERCEPTIN) 440 MG injection 440 mg by Intravenous (Continuous Infusion) route every 21 ( twenty-one) days. infusion every 3 weeks                  Home: Home Living Family/patient expects to be discharged to:: Private residence Living Arrangements: Spouse/significant other Available Help at Discharge: Family, Available 24 hours/day (including dtr and grand dtr, [place]) Type of Home: House Home Access: Stairs to enter, Ship broker of Steps: 2 Entrance Stairs-Rails: Left Home Layout: Two level, Able to live on main level with bedroom/bathroom Bathroom Shower/Tub: Health visitor: Standard Bathroom Accessibility: Yes Home Equipment: Tub bench, Shower  seat, BSC/3in1, Rollator (4 wheels), Cane - single point Additional Comments: Chair lift  Lives With: Spouse   Functional History: Prior Function Prior Level of Function : Independent/Modified Independent, Driving Mobility Comments: Rollator at baseline  outside and [facility] getting into the grocery store from car to cart ADLs Comments: ind ADLs   Functional Status:  Mobility: Bed Mobility Overal bed mobility: Needs Assistance Bed Mobility: Supine to Sit Supine to sit: Mod assist, +2 for safety/equipment General bed mobility comments: Pt was received sitting up in the recliner. Transfers Overall transfer level: Needs assistance Equipment used: Rolling walker (2 wheels) Transfers: Sit to/from Stand, Bed to chair/wheelchair/BSC Sit to Stand: +2 physical assistance, Mod assist Bed to/from chair/wheelchair/BSC transfer type:: Step pivot Step pivot transfers: Mod assist, +2 physical assistance General transfer comment: Increased time. VC's for hand placement on seated surface for safety. Ambulation/Gait Ambulation/Gait assistance: Min assist, +2 safety/equipment Gait Distance (Feet): 15 Feet Assistive device: Rolling walker (2 wheels) Gait Pattern/deviations: Step-to pattern, Decreased stride length, Trunk flexed, Narrow base of support General Gait Details: VC's for improved posture, closer walker proximity and forward gaze. Pt appearing to maintain TDWB status well however VC's throughout for reminders. Gait velocity: Decreased Gait velocity interpretation: <1.31 ft/sec, indicative of household ambulator   ADL: ADL Overall ADL's : Needs assistance/impaired Grooming: Set up, Sitting Upper Body Dressing : Set up, Sitting Lower Body Dressing: Total assistance, +2 for physical assistance, Sit to/from stand Toilet Transfer: Moderate assistance, Maximal assistance, +2 for physical assistance, Rolling walker (2 wheels) Toilet Transfer Details (indicate cue type and reason): stand pivot to recliner Functional mobility during ADLs: Moderate assistance, Maximal assistance, +2 for physical assistance, Rolling walker (2 wheels)   Cognition: Cognition Overall Cognitive Status: Within Functional Limits for tasks assessed Orientation Level: Oriented  X4 Cognition Arousal: Alert Behavior During Therapy: WFL for tasks assessed/performed Overall Cognitive Status: Within Functional Limits for tasks assessed Area of Impairment: Attention, Following commands, Problem solving Current Attention Level: Sustained Following Commands: Follows one step commands consistently, Follows one step commands with increased time Problem Solving: Slow processing, Requires verbal cues General Comments: patient oriented and following commands, good awareness to deficits but some decreased attention to task requring redirection- maybe baseline   Physical Exam: Blood pressure 137/77, pulse (!) 104, temperature 98.2 F (36.8 C), resp. rate 16, height 5\' 1"  (1.549 m), weight 58.8 kg, SpO2 94%. Physical Exam Vitals and nursing note reviewed.  Constitutional:      Appearance: She is normal weight.     Comments: Somewhat frail appearing; sitting up in bed; awake, alert, appropriate, NAD  HENT:     Head: Normocephalic and atraumatic.     Right Ear: External ear normal.     Left Ear: External ear normal.     Nose: Nose normal. No congestion.     Mouth/Throat:     Mouth: Mucous membranes are dry.     Pharynx: Oropharynx is clear. No oropharyngeal exudate.  Eyes:     General:        Right eye: No discharge.        Left eye: No discharge.     Extraocular Movements: Extraocular movements intact.  Cardiovascular:     Rate and Rhythm: Normal rate. Rhythm irregular.     Heart sounds: Normal heart sounds. No murmur heard.    No gallop.     Comments: Labile heart rate- in Afib Pulmonary:     Effort: Pulmonary effort is normal. No respiratory distress.  Breath sounds: Normal breath sounds. No wheezing, rhonchi or rales.  Abdominal:     General: Bowel sounds are normal. There is no distension.     Palpations: Abdomen is soft.     Tenderness: There is no abdominal tenderness.     Comments: Abd csection incision from acetabular fx- glued- looks good- no drainage   Musculoskeletal:     Cervical back: Neck supple. No tenderness.     Comments: Ue's 5-/5 B/L RLE- 2-/5 HF; KE 3+/5- both limited due to pain DF/PF 5-/5 LLE- HF 4/5; KE 4+/5; DF/PF 5-/5- said has always been weaker since back surgery  Skin:    General: Skin is warm and dry.     Comments: Surgical site is clean dry and intact on pelvis- csection location  Neurological:     General: No focal deficit present.     Mental Status: She is alert and oriented to person, place, and time.     Comments: Patient is alert.  Oriented to person and place and follows commands.  Complaints of right shoulder pain. Decreased to light touch in LE's B/L- more in L1-L3 dermatomal distribution B/L   Psychiatric:        Mood and Affect: Mood normal.        Behavior: Behavior normal.        Lab Results Last 48 Hours        Results for orders placed or performed during the hospital encounter of 06/29/23 (from the past 48 hour(s))  Basic metabolic panel     Status: Abnormal    Collection Time: 07/04/23  6:17 AM  Result Value Ref Range    Sodium 131 (L) 135 - 145 mmol/L    Potassium 3.9 3.5 - 5.1 mmol/L    Chloride 96 (L) 98 - 111 mmol/L    CO2 27 22 - 32 mmol/L    Glucose, Bld 179 (H) 70 - 99 mg/dL      Comment: Glucose reference range applies only to samples taken after fasting for at least 8 hours.    BUN 8 8 - 23 mg/dL    Creatinine, Ser 1.61 0.44 - 1.00 mg/dL    Calcium 8.1 (L) 8.9 - 10.3 mg/dL    GFR, Estimated >09 >60 mL/min      Comment: (NOTE) Calculated using the CKD-EPI Creatinine Equation (2021)      Anion gap 8 5 - 15      Comment: Performed at [facility], [address] [place], [place] [address]  CBC     Status: Abnormal    Collection Time: 07/04/23  6:17 AM  Result Value Ref Range    WBC 9.4 4.0 - 10.5 K/uL    RBC 3.16 (L) 3.87 - 5.11 MIL/uL    Hemoglobin 10.0 (L) 12.0 - 15.0 g/dL    HCT 81.1 (L) 91.4 - 46.0 %    MCV 92.1 80.0 - 100.0 fL    MCH 31.6 26.0 - 34.0 pg     MCHC 34.4 30.0 - 36.0 g/dL    RDW 78.2 95.6 - 21.3 %    Platelets 109 (L) 150 - 400 K/uL      Comment: REPEATED TO VERIFY    nRBC 0.0 0.0 - 0.2 %      Comment: Performed at [facility], [address] [place], [place] [address]  CBC     Status: Abnormal    Collection Time: 07/05/23  5:57 AM  Result Value Ref Range  WBC 9.8 4.0 - 10.5 K/uL    RBC 3.32 (L) 3.87 - 5.11 MIL/uL    Hemoglobin 10.2 (L) 12.0 - 15.0 g/dL    HCT 01.0 (L) 27.2 - 46.0 %    MCV 92.5 80.0 - 100.0 fL    MCH 30.7 26.0 - 34.0 pg    MCHC 33.2 30.0 - 36.0 g/dL    RDW 53.6 64.4 - 03.4 %    Platelets 134 (L) 150 - 400 K/uL      Comment: REPEATED TO VERIFY    nRBC 0.0 0.0 - 0.2 %      Comment: Performed at [facility], [address], [place], [place] [address]  Basic metabolic panel     Status: Abnormal    Collection Time: 07/05/23  6:21 AM  Result Value Ref Range    Sodium 130 (L) 135 - 145 mmol/L    Potassium 3.6 3.5 - 5.1 mmol/L    Chloride 96 (L) 98 - 111 mmol/L    CO2 26 22 - 32 mmol/L    Glucose, Bld 139 (H) 70 - 99 mg/dL      Comment: Glucose reference range applies only to samples taken after fasting for at least 8 hours.    BUN 7 (L) 8 - 23 mg/dL    Creatinine, Ser 5.63 0.44 - 1.00 mg/dL    Calcium 7.9 (L) 8.9 - 10.3 mg/dL    GFR, Estimated >87 >56 mL/min      Comment: (NOTE) Calculated using the CKD-EPI Creatinine Equation (2021)      Anion gap 8 5 - 15      Comment: Performed at [facility], [address] [place], [place] [address]       Imaging Results (Last 48 hours)  DG Knee 1-2 Views Right   Result Date: 07/04/2023 CLINICAL DATA:  Right knee pain following a fall. EXAM: RIGHT KNEE - 1-2 VIEW COMPARISON:  None Available. FINDINGS: Moderate lateral joint space narrowing. Mild lateral and patellofemoral spur formation. No fracture, dislocation or effusion. IMPRESSION: 1. No fracture. 2. Mild-to-moderate degenerative changes. Electronically Signed   By: Beckie Salts M.D.    On: 07/04/2023 15:10           Blood pressure 137/77, pulse (!) 104, temperature 98.2 F (36.8 C), resp. rate 16, height 5\' 1"  (1.549 m), weight 58.8 kg, SpO2 94%.   Medical Problem List and Plan: 1. Functional deficits secondary to right comminuted acetabular/right inferior pubic ramus fracture fracture.  Status post ORIF right acetabular fracture 07/03/2023.  Touchdown weightbearing.  No posterior hip precautions needed             -patient may  shower- cover incision             -ELOS/Goals: 10-12 days mod I to supervision 2.  Antithrombotics: -DVT/anticoagulation:  Pharmaceutical: Eliquis             -antiplatelet therapy: N/A 3. Pain Management: Neurontin 600 mg 3 times daily, Robaxin 500 mg 4 times daily, hydrocodone as needed- will increase Robaxin to 750 mg q6H 4. Mood/Behavior/Sleep: Valium 10 mg nightly as prior to admission.  Provide emotional support             -antipsychotic agents: N/A 5. Neuropsych/cognition: This patient is capable of making decisions on her own behalf. 6. Skin/Wound Care: Routine skin checks 7. Fluids/Electrolytes/Nutrition: Routine in and outs with follow-up chemistries 8.  Atrial fibrillation/flutter with RVR.  Followed by Dr. Dulce Sellar.  Toprol XL 50 mg nightly.Refused Cardizem 9.  Hypothyroidism.  Synthroid 10.  History of rosacea.  Patient maintained on chronic doxycycline 11.  Hyperlipidemia.  Crestor 12.  Stage IV breast cancer with mets to liver and lung.  Patient receives Trastuzumab infusion every 3 weeks followed by Dr. Gery Pray of oncology services 13.  Chronic left shoulder pain.  Patient did receive steroid injection 07/05/2023 per orthopedic services 14.  History of urinary retention.  Maintained on Flomax as prior to admission.  Check PVR Chronic constipation.  Senokot-S 2 tablets twice daily, Colace 100 mg twice daily, Dulcolax suppository as needed- LBM yesterday with enema     Charlton Amor, PA-C 07/06/2023     I have  personally performed a face to face diagnostic evaluation of this patient and formulated the key components of the plan.  Additionally, I have personally reviewed laboratory data, imaging studies, as well as relevant notes and concur with the physician assistant's documentation above.   The patient's status has not changed from the original H&P.  Any changes in documentation from the acute care chart have been noted above.

## 2023-07-06 NOTE — [place] Notes (Signed)
Inpatient Rehabilitation Admission Medication Review by a Pharmacist  A complete drug regimen review was completed for this patient to identify any potential clinically significant medication issues.  High Risk Drug Classes Is patient taking? Indication by Medication  Antipsychotic No   Anticoagulant Yes Eliquis: AFib  Antibiotic Yes Doxycycline: Rosacea  Opioid Yes Vicodin: pain  Antiplatelet No   Hypoglycemics/insulin No   Vasoactive Medication Yes Lasix, Toprol, diltiazem: HF, AFib, HTN Flomax: urinary retention  Chemotherapy No   Other Yes Protonix: GERD Zofran: nausea Tylenol: pain VitD: vitamin/supplement Diazepam: mood/sleep Docusate, Bisacodyl, Senokot: constipation Gabapentin: nerve pain Levothyroxine: hypothyroid Robaxin: muscle spasms Protonix: GERD Crestor: HLD     Type of Medication Issue Identified Description of Issue Recommendation(s)  Drug Interaction(s) (clinically significant)     Duplicate Therapy     Allergy     No Medication Administration End Date     Incorrect Dose     Additional Drug Therapy Needed     Significant med changes from prior encounter (inform family/care partners about these prior to discharge). PTA meds: trastuzumab q3wks (last dose 11/4, next dose due 11/25) Restart or discontinue as appropriate. Communicate medication changes with patient/family at discharge  Other       Clinically significant medication issues were identified that warrant physician communication and completion of prescribed/recommended actions by midnight of the next day:  No   Time spent performing this drug regimen review (minutes): 30   Thank you for allowing pharmacy to be a part of this patient's care.   Signe Colt, PharmD 07/06/2023 2:19 PM    **Pharmacist phone directory can be found on [email or web address] listed under [facility]**

## 2023-07-06 NOTE — Progress Notes (Signed)
Genice Rouge, MD  Physician Physical Medicine and Rehabilitation   PMR Pre-admission    Signed   Date of Service: 07/05/2023  5:25 PM  Related encounter: ED to Hosp-Admission (Discharged) from 06/29/2023 in [facility]   Signed     Expand All Collapse All  Show:Clear all [x] Written[x] Templated[x] Copied  Added by: [x] Robertine Kipper, Tye [place], RN[x] Genice Rouge, MD  [] Hover for details PMR Admission Coordinator Pre-Admission Assessment   Patient: Karen Allison is an 77 y.o., female MRN: [id number] DOB: Feb 01, 1946 Height: 5\' 1"  (154.9 cm) Weight: 58.8 kg   Insurance Information HMO:     PPO:      PCP:      IPA:      80/20:      OTHER:  PRIMARY: Medicare a and b      Policy#: [id number]      Subscriber: pt Benefits:  Phone #: passport one source     Name: 11/13 Eff. Date: A 01/20/2002 and b 10/1 2007     Deduct: $1632      Out of Pocket Max: none      Life Max: none CIR: 100%      SNF: 20 full days Outpatient: 80%     Co-Pay: 20% Home Health: 100%      Co-Pay: none DME: 80%     Co-Pay: 20% Providers: in network  SECONDARY: BCBS supplement      Policy#: [id number]   Financial Counselor:       Phone#:    The "Data Collection Information Summary" for patients in Inpatient Rehabilitation Facilities with attached "Privacy Act Statement-Health Care Records" was provided and verbally reviewed with: Patient   Emergency Contact Information Contact Information       Name Relation Home Work Mobile    Loma H Spouse [phone]   [phone]         Other Contacts       Name Relation Home Work Springer Daughter [phone]   [phone]    [name]     [phone]         Current Medical History  Patient Admitting Diagnosis: acetabular fx, fall   History of Present Illness: 77 year old right-handed female with history of atrial fibrillation maintained on chronic Eliquis  followed by Dr. 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 Gery Pray of oncology services and receives trastuzumab every 3 weeks, hypothyroidism, ACDF with fusion 05/06/2014, quit smoking 61 years ago.  She used a rollator at baseline outside of the home and a straight point cane getting into the grocery store from car to cart.  Independent with ADLs.  Family with good support.  Presented to [facility] on 06/29/2023 after mechanical fall in the kitchen while getting her coffee landing on her right side with no loss of consciousness. Transferred to [facility] on 06/29/23.    X-rays and imaging revealed comminuted fracture involving the acetabulum diffusely with displaced fragments and a component protrusion from the femoral head.  Separate fracture of the right inferior pubic ramus.  Some extraperitoneal hematoma along the right pelvic sidewall.  Admission chemistries unremarkable except WBC 13,100, sodium 131.  Hospital course patient A-fib with RVR rates in the 120s felt most likely driven by hip fracture.  She did receive IV metoprolol transition to Toprol.   latest echocardiogram from August showed ejection fraction 55 to 60%.  She was stabilized and cleared for surgery.  Patient underwent open reduction internal fixation of right acetabular fracture 07/03/2023 per Dr. Caryn Bee Haddix.  Touchdown weightbearing right lower extremity.  No posterior hip precautions needed.  Her chronic Eliquis has been resumed.  Hyponatremia with latest sodium 130 with urine osmolality 206 serum osmolality 275.  Patient did have complaints of chronic left shoulder pain with follow-up per orthopedic services felt related to osteoarthritis underwent left subacromial joint injection 07/05/2023 per orthopedic services.     Patient's medical record from [facility] has been reviewed by the  rehabilitation admission coordinator and physician.   Past Medical History      Past Medical History:  Diagnosis Date   A-fib (HCC)     Acute respiratory failure with hypoxia (HCC) 05/09/2014   Adult hypothyroidism 05/03/2013   Arthritis     Blood dyscrasia      bleeds and bruises easily   Breast cancer (HCC)      left   Cervical spondylosis without myelopathy 05/06/2014    C3-4     Endometrial cancer (HCC) 01/21/1988   Fx lower humerus-closed 10/22/1997   Gait disorder 07/05/2016   Graves' disease 07/16/2021   HCAP (healthcare-associated pneumonia) 05/09/2014   Hepatitis       Hep A years ago   History of corticosteroid therapy 02/13/2023   History of endocrine disorder 02/13/2023   HNP (herniated nucleus pulposus), cervical 05/06/2014   Hypertension     Hyponatremia 12/02/2021   Hypothyroidism      Graves disease, age 35 yo   Malignant neoplasm of left breast (HCC) 06/05/2020   Nose trouble      right nostril will hemorrhage at times   Osteoporosis 11/04/2020   Other forms of scoliosis, thoracolumbar region 11/03/2016   Pneumonia     Rib pain on left side 09/26/2022   Rosacea     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 Yes   Family History   family history includes Congestive Heart Failure in her mother; Heart disease in her father; Rheum arthritis in her daughter; Tongue cancer in her grandchild.   Current Medications  Current Medications    Current Facility-Administered Medications:    acetaminophen (TYLENOL) tablet 650 mg, 650 mg, Oral, TID PRN, West Bali, PA-C, 650 mg at 07/05/23 1445   apixaban (ELIQUIS) tablet 2.5 mg, 2.5 mg, Oral, BID, Joaquim Nam, RPH, 2.5 mg at 07/06/23  5284   bisacodyl (DULCOLAX) suppository 10 mg, 10 mg, Rectal, Daily PRN, Hongalgi, Maximino Greenland, MD   Chlorhexidine Gluconate Cloth 2 % PADS 6 each, 6 each, Topical, Q0600, Meredeth Ide, MD, 6 each at 07/06/23 1324   cholecalciferol (VITAMIN D3) 25 MCG (1000 UNIT) tablet 1,000 Units, 1,000 Units, Oral, Daily, McClung, Sarah A, PA-C   diazepam (VALIUM) tablet 10 mg, 10 mg, Oral, QHS, McClung, Sarah A, PA-C, 10 mg at 07/05/23 2150   diltiazem (CARDIZEM) tablet 30 mg, 30 mg, Oral, Q6H PRN, McClung, Sarah A, PA-C   docusate sodium (COLACE) capsule 100 mg, 100 mg, Oral, BID, Hongalgi, Anand D, MD, 100 mg at 07/06/23 [phone]  doxycycline (VIBRAMYCIN) 50 MG capsule 50 mg, 50 mg, Oral, Daily, West Bali, PA-C, 50 mg at 07/06/23 0900   gabapentin (NEURONTIN) capsule 600 mg, 600 mg, Oral, TID, West Bali, PA-C, 600 mg at 07/06/23 9147   HYDROcodone-acetaminophen (NORCO/VICODIN) 5-325 MG per tablet 1-2 tablet, 1-2 tablet, Oral, Q4H PRN, West Bali, PA-C, 1 tablet at 07/06/23 1008   HYDROmorphone (DILAUDID) injection 0.5 mg, 0.5 mg, Intravenous, Q2H PRN, West Bali, PA-C   levothyroxine (SYNTHROID) tablet 50 mcg, 50 mcg, Oral, Q0600, West Bali, PA-C, 50 mcg at 07/06/23 0554   methocarbamol (ROBAXIN) tablet 500 mg, 500 mg, Oral, QID, West Bali, PA-C, 500 mg at 07/06/23 0856   metoCLOPramide (REGLAN) tablet 5-10 mg, 5-10 mg, Oral, Q8H PRN **OR** metoCLOPramide (REGLAN) injection 5-10 mg, 5-10 mg, Intravenous, Q8H PRN, McClung, Sarah A, PA-C   metoprolol succinate (TOPROL-XL) 24 hr tablet 50 mg, 50 mg, Oral, QHS, McClung, Sarah A, PA-C, 50 mg at 07/05/23 2150   ondansetron (ZOFRAN) tablet 4 mg, 4 mg, Oral, Q6H PRN **OR** ondansetron (ZOFRAN) injection 4 mg, 4 mg, Intravenous, Q6H PRN, McClung, Sarah A, PA-C   pantoprazole (PROTONIX) EC tablet 40 mg, 40 mg, Oral, Daily, West Bali, PA-C, 40 mg at 07/06/23 8295   pentafluoroprop-tetrafluoroeth (GEBAUERS) aerosol, , Topical, PRN,  West Bali, PA-C   rosuvastatin (CRESTOR) tablet 10 mg, 10 mg, Oral, Q Sun, McClung, Sarah A, PA-C, 10 mg at 07/02/23 6213   senna-docusate (Senokot-S) tablet 2 tablet, 2 tablet, Oral, BID, Hongalgi, Anand D, MD, 2 tablet at 07/06/23 0856   tamsulosin (FLOMAX) capsule 0.4 mg, 0.4 mg, Oral, QPM, McClung, Sarah A, PA-C, 0.4 mg at 07/05/23 1739     Patients Current Diet:  Diet Order                  Diet regular Room service appropriate? Yes; Fluid consistency: Thin  Diet effective now                       Precautions / Restrictions Precautions Precautions: Fall Restrictions Weight Bearing Restrictions: Yes RLE Weight Bearing: Touchdown weight bearing    Has the patient had 2 or more falls or a fall with injury in the past year? Yes   Prior Activity Level Community (5-7x/wk): Mod I with rollator   Prior Functional Level Self Care: Did the patient need help bathing, dressing, using the toilet or eating? Independent   Indoor Mobility: Did the patient need assistance with walking from room to room (with or without device)? Independent   Stairs: Did the patient need assistance with internal or external stairs (with or without device)? Independent   Functional Cognition: Did the patient need help planning regular tasks such as shopping or remembering to take medications? Independent   Patient Information Are you of Hispanic, Latino/a,or Spanish origin?: A. No, not of Hispanic, Latino/a, or Spanish origin What is your race?: A. White Do you need or want an interpreter to communicate with a doctor or health care staff?: 0. No   Patient's Response To:  Health Literacy and Transportation Is the patient able to respond to health literacy and transportation needs?: Yes Health Literacy - How often do you need to have someone help you when you read instructions, pamphlets, or other written material from your doctor or pharmacy?: Never In the past 12 months, has lack of  transportation kept you from medical appointments or from getting medications?: No In the past  12 months, has lack of transportation kept you from meetings, work, or from getting things needed for daily living?: No   Journalist, newspaper / Equipment Home Equipment: Tub bench, Shower seat, BSC/3in1, Rollator (4 wheels), Cane - single point   Prior Device Use: Indicate devices/aids used by the patient prior to current illness, exacerbation or injury? Walker   Current Functional Level Cognition   Overall Cognitive Status: Within Functional Limits for tasks assessed Current Attention Level: Sustained Orientation Level: Oriented X4 Following Commands: Follows one step commands consistently, Follows one step commands with increased time General Comments: patient oriented and following commands, good awareness to deficits but some decreased attention to task requring redirection- maybe baseline    Extremity Assessment (includes Sensation/Coordination)   Upper Extremity Assessment: Defer to OT evaluation LUE Deficits / Details: hx of L humerus ORIF  Lower Extremity Assessment: RLE deficits/detail RLE Deficits / Details: Acute pain, decreased strength and AROM consistent with above mentioned injury. RLE: Unable to fully assess due to pain     ADLs   Overall ADL's : Needs assistance/impaired Grooming: Set up, Sitting Upper Body Dressing : Set up, Sitting Lower Body Dressing: Total assistance, +2 for physical assistance, Sit to/from stand Toilet Transfer: Moderate assistance, Maximal assistance, +2 for physical assistance, Rolling walker (2 wheels) Toilet Transfer Details (indicate cue type and reason): stand pivot to recliner Functional mobility during ADLs: Moderate assistance, Maximal assistance, +2 for physical assistance, Rolling walker (2 wheels)     Mobility   Overal bed mobility: Needs Assistance Bed Mobility: Supine to Sit Supine to sit: Mod assist, +2 for  safety/equipment General bed mobility comments: Pt was received sitting up in the recliner.     Transfers   Overall transfer level: Needs assistance Equipment used: Rolling walker (2 wheels) Transfers: Sit to/from Stand, Bed to chair/wheelchair/BSC Sit to Stand: +2 physical assistance, Mod assist Bed to/from chair/wheelchair/BSC transfer type:: Step pivot Step pivot transfers: Mod assist, +2 physical assistance General transfer comment: Increased time. VC's for hand placement on seated surface for safety.     Ambulation / Gait / Stairs / Wheelchair Mobility   Ambulation/Gait Ambulation/Gait assistance: Min assist, +2 safety/equipment Gait Distance (Feet): 15 Feet Assistive device: Rolling walker (2 wheels) Gait Pattern/deviations: Step-to pattern, Decreased stride length, Trunk flexed, Narrow base of support General Gait Details: VC's for improved posture, closer walker proximity and forward gaze. Pt appearing to maintain TDWB status well however VC's throughout for reminders. Gait velocity: Decreased Gait velocity interpretation: <1.31 ft/sec, indicative of household ambulator     Posture / Balance Balance Overall balance assessment: Needs assistance Sitting-balance support: No upper extremity supported, Feet supported Sitting balance-Leahy Scale: Fair Standing balance support: Bilateral upper extremity supported, During functional activity, Reliant on assistive device for balance Standing balance-Leahy Scale: Poor Standing balance comment: relies on BUE and external support     Special needs/care consideration Fall precautions    Previous Home Environment  Living Arrangements: Spouse/significant other  Lives With: Spouse Available Help at Discharge: Family, Available 24 hours/day (including dtr and grand dtr, [place]) Type of Home: House Home Layout: Two level, Able to live on main level with bedroom/bathroom Home Access: Stairs to enter, Ramped entrance Entrance  Stairs-Rails: Left Entrance Stairs-Number of Steps: 2 Bathroom Shower/Tub: Health visitor: Standard Bathroom Accessibility: Yes How Accessible: Accessible via walker Home Care Services: No Additional Comments: Chair lift   Discharge Living Setting Plans for Discharge Living Setting: Patient's home, Lives with (comment) (spouse) Type of Home at  Discharge: House Discharge Home Layout: Two level, Able to live on main level with bedroom/bathroom Discharge Home Access: Stairs to enter Entrance Stairs-Rails: Left Entrance Stairs-Number of Steps: 2 Discharge Bathroom Shower/Tub: Walk-in shower Discharge Bathroom Toilet: Standard Discharge Bathroom Accessibility: Yes How Accessible: Accessible via walker Does the patient have any problems obtaining your medications?: No   Social/Family/Support Systems Patient Roles: Spouse Contact Information: spouse, Jomarie Longs and dtr, Idalia Needle Anticipated Caregiver: spouse, dtr and granddtr Anticipated Caregiver's Contact Information: see contacts Ability/Limitations of Caregiver: no limitations Caregiver Availability: 24/7 Discharge Plan Discussed with Primary Caregiver: Yes Is Caregiver In Agreement with Plan?: Yes Does Caregiver/Family have Issues with Lodging/Transportation while Pt is in Rehab?: No   Goals Patient/Family Goal for Rehab: mod i to Supervision with PT and OT Expected length of stay: ELOS 10 to 12 days Pt/Family Agrees to Admission and willing to participate: Yes Program Orientation Provided & Reviewed with Pt/Caregiver Including Roles  & Responsibilities: Yes   Decrease burden of Care through IP rehab admission: n/a   Possible need for SNF placement upon discharge: not anticipated   Patient Condition: I have reviewed medical records from [facility], spoken with patient and family member. I met with patient at the bedside for inpatient rehabilitation assessment.  Patient will benefit from  ongoing PT and OT, can actively participate in 3 hours of therapy a day 5 days of the week, and can make measurable gains during the admission.  Patient will also benefit from the coordinated team approach during an Inpatient Acute Rehabilitation admission.  The patient will receive intensive therapy as well as Rehabilitation physician, nursing, social worker, and care management interventions.  Due to bladder management, bowel management, safety, skin/wound care, disease management, medication administration, pain management, and patient education the patient requires 24 hour a day rehabilitation nursing.  The patient is currently min assist with mobility and basic ADLs.  Discharge setting and therapy post discharge at home with home health is anticipated.  Patient has agreed to participate in the Acute Inpatient Rehabilitation Program and will admit today.   Preadmission Screen Completed By:  Clois Dupes, RN MSN 07/06/2023 10:38 AM ______________________________________________________________________   Discussed status with Dr. Berline Chough on 07/06/23 at 1038 and received approval for admission today.   Admission Coordinator:  Clois Dupes, RN MSN time [id number] Date 07/06/23    Assessment/Plan: Diagnosis: R acetabular fx s/p ORIF Does the need for close, 24 hr/day Medical supervision in concert with the patient's rehab needs make it unreasonable for this patient to be served in a less intensive setting? Yes Co-Morbidities requiring supervision/potential complications: Inferior pubic rami fx; OIC; Afib with RVR; stage IV rbeast CA; GERD/L shoulder pain; got injections Due to bladder management, bowel management, safety, skin/wound care, disease management, medication administration, pain management, and patient education, does the patient require 24 hr/day rehab nursing? Yes Does the patient require coordinated care of a physician, rehab nurse, PT, OT, and SLP to address physical and  functional deficits in the context of the above medical diagnosis(es)? Yes Addressing deficits in the following areas: balance, endurance, locomotion, strength, transferring, bowel/bladder control, bathing, dressing, feeding, grooming, and toileting Can the patient actively participate in an intensive therapy program of at least 3 hrs of therapy 5 days a week? Yes The potential for patient to make measurable gains while on inpatient rehab is good Anticipated functional outcomes upon discharge from inpatient rehab: modified independent and supervision PT, modified independent and supervision OT, n/a SLP Estimated rehab  length of stay to reach the above functional goals is: 10-12 days Anticipated discharge destination: Home 10. Overall Rehab/Functional Prognosis: good     MD Signature:           Revision History

## 2023-07-06 NOTE — Plan of Care (Signed)
  Problem: Consults Goal: RH GENERAL PATIENT EDUCATION Description: See Patient Education module for education specifics. Outcome: Progressing   Problem: RH BOWEL ELIMINATION Goal: RH STG MANAGE BOWEL WITH ASSISTANCE Description: STG Manage Bowel with min Assistance. Outcome: Progressing Goal: RH STG MANAGE BOWEL W/MEDICATION W/ASSISTANCE Description: STG Manage Bowel with Medication with min Assistance. Outcome: Progressing   Problem: RH BLADDER ELIMINATION Goal: RH STG MANAGE BLADDER WITH ASSISTANCE Description: STG Manage Bladder With min Assistance Outcome: Progressing Goal: RH STG MANAGE BLADDER WITH MEDICATION WITH ASSISTANCE Description: STG Manage Bladder With Medication With min Assistance. Outcome: Progressing   Problem: RH SKIN INTEGRITY Goal: RH STG SKIN FREE OF INFECTION/BREAKDOWN Description: Incision will continue to heal and be free of infection and skin will remain breakdown free with min assist  Outcome: Progressing Goal: RH STG ABLE TO PERFORM INCISION/WOUND CARE W/ASSISTANCE Description: STG Able To Perform Incision/Wound Care With min Assistance. Outcome: Progressing   Problem: RH SAFETY Goal: RH STG ADHERE TO SAFETY PRECAUTIONS W/ASSISTANCE/DEVICE Description: STG Adhere to Safety Precautions With min Assistance/Device. Outcome: Progressing Goal: RH STG DECREASED RISK OF FALL WITH ASSISTANCE Description: STG Decreased Risk of Fall With min Assistance. Outcome: Progressing   Problem: RH PAIN MANAGEMENT Goal: RH STG PAIN MANAGED AT OR BELOW PT'S PAIN GOAL Description: Less than 4 with PRN medication use with min assist  Outcome: Progressing   Problem: RH KNOWLEDGE DEFICIT GENERAL Goal: RH STG INCREASE KNOWLEDGE OF SELF CARE AFTER HOSPITALIZATION Description: Patient/caregiver will be able to manage medications, self care, and pain control from nursing education and nursing handouts independently  Outcome: Progressing

## 2023-07-06 NOTE — Progress Notes (Signed)
Inpatient Rehabilitation Admissions Coordinator   I have CIR bed to admit him to today. Acute team and TOC made aware. I will make the arrangements to admit.  Ottie Glazier, RN, MSN Rehab Admissions Coordinator [phone] 07/06/2023 10:37 AM

## 2023-07-06 NOTE — Discharge Summary (Signed)
Physician Discharge Summary  Adylee Lovern OZD:[id number] DOB: 02/16/1946  PCP: Philemon Kingdom, MD  Admitted from: Home Discharged to: CIR  Admit date: 06/29/2023 Discharge date: 07/06/2023  Recommendations for Outpatient Follow-up:    Follow-up Information     MD at CIR Follow up.   Why: Recommend follow-up of CBC and BMP 2-3 times per week at least for the next week.  Next BMP on 11/16.        Philemon Kingdom, MD. Schedule an appointment as soon as possible for a visit.   Specialty: Internal Medicine Why: To be seen upon discharge from CIR. Contact information: [address] [place] [address] [phone]         Roby Lofts, MD. Schedule an appointment as soon as possible for a visit in 2 week(s).   Specialty: Orthopedic Surgery Why: Postop follow-up for wound check and x-rays. Contact information: [address] [place] [address] [phone]                  Home Health: None    Equipment/Devices: TBD at [facility]    Discharge Condition: Improved and stable.   Code Status: Full Code Diet recommendation:  Discharge Diet Orders (From admission, onward)     Start     Ordered   07/06/23 0000  Diet general        07/06/23 1236             Discharge Diagnoses:  Principal Problem:   Acetabular fracture (HCC) Active Problems:   Hypertension   Adult hypothyroidism   A-fib [facility])   Brief Summary: 77 y.o. female with medical history significant of atrial fibrillation on Eliquis, hypertension, stage IV triple positive breast cancer, hypothyroidism, opioid related chronic constipation, presented after mechanical fall at home.  She made herself some coffee and wore her slippers and as she was walking she tripped on herself and fell towards her right side.  Imaging confirmed a right acetabular fracture.  Orthopedics consulted and patient underwent ORIF of right acetabular fracture on 11/11.  Discharged to CIR   Assessment &  Plan:   Right acetabular fracture Sustained after a mechanical fall at home Orthopedics consulted and patient underwent ORIF of right acetabular fracture on 11/11 Per orthopedic follow-up: TDWB RLE, multimodality pain control, continue home dose of Eliquis which will suffice for postop DVT prophylaxis, bowel regimen for chronic constipation, vitamin D supplements, potential CIR, follow-up with orthopedics in 2 weeks of DC for wound check and repeat x-rays.   Atrial fibrillation/flutter with RVR -Patient went into A-fib with RVR earlier on in hospital course -She takes metoprolol 25 mg daily at home, now dose increased to 50 mg at bedtime. -Eliquis held for surgery and was started postsurgery on 11/12. -Rate is mostly controlled in the 90-100 range with transient and occasional RVR up to 110-120, mostly with activity, probably related to deconditioning.  Closely monitor at CIR, if sustained RVR >110, would consult cardiology for assistance and may have to start a second rate control medication i.e. Cardizem CD.  Patient sees Dr. Dulce Sellar, [facility] heart care as outpatient.   Essential Hypertension -Continue Toprol XL 50 mg at bedtime.  Controlled   ?  HFpEF -Last echo from August showed moderate pulmonary hypertension -EF was 55 to 60% -Patient takes Lasix 20 mg intermittently at home -Diuresed well with p.o. Lasix 20 mg given intermittently over past 1 week -Clinically euvolemic.    Hypothyroidism -Continue Synthroid 50 mcg daily - TSH 1.825  on 11/9   GERD -Continue p.o. Protonix   Hyponatremia -Improved 131 with diuresis -Urine osmolality 206, Serum osmolality 275 -Serum sodium has been labile in the hospital.  This had improved to 131, 130 over the last 2 days but then again dropped to 126 today (corrects to 128 for hyperglycemia).  Clinically is euvolemic and asymptomatic of this. Follow BMP closely at CIR.  Exact etiology of this is unclear. On reviewing prior labs, appears to have  chronic intermittent hyponatremia since 2022 with serum sodiums mostly ranging in the low 130s.   Stage IV breast cancer with mets to liver and lung -She currently is on trastuzumab every 3 weeks and continues to have her disease under control.  -Followed by oncology as outpatient   Nonpruritic skin rash Unclear etiology.  Per patient's history, appears to have it intermittently for unclear reasons. Reviewed med list in detail and along with pharmacist, mostly getting meds as she was getting at home. Skin appears dry.  Trial of moisturizing cream. Much improved on today's exam with patient's female RN at bedside.   Left shoulder pain due to osteoarthritis Orthopedics injected intra-articular steroids on 11/13.  Orthopedics follow-up.   Chronic constipation Reports no real BM over the last several days.  Unsuccessful despite Colace, Dulcolax suppository.  Says that MiraLAX does not work for her and does not want to take it.  Had BM post enema.  Adjusted bowel regimen.   Thrombocytopenia: May be due to trauma and acute blood loss.  Lowest platelet count was 98 which is gradually improving and up to 134.  Resolved on day of discharge.   Chronic anemia Baseline hemoglobin may be in the 11-12 g range Hemoglobin now stable in the low 10 g range. CT shows some extraperitoneal hematoma along the right pelvic sidewall.  Noted findings.  Hemoglobin stable.   Body mass index is 24.49 kg/m.         Consultants:   Orthopedics   Procedures:   As above      Discharge Instructions  Discharge Instructions     Call MD for:  difficulty breathing, headache or visual disturbances   Complete by: As directed    Call MD for:  extreme fatigue   Complete by: As directed    Call MD for:  persistant dizziness or light-headedness   Complete by: As directed    Call MD for:  persistant nausea and vomiting   Complete by: As directed    Call MD for:  redness, tenderness, or signs of infection  (pain, swelling, redness, odor or green/yellow discharge around incision site)   Complete by: As directed    Call MD for:  severe uncontrolled pain   Complete by: As directed    Call MD for:  temperature >100.4   Complete by: As directed    Diet general   Complete by: As directed    Discharge instructions   Complete by: As directed    Acetaminophen dose from all sources not to exceed 4 g/day.    Weightbearing: TDWB RLE ROM: Unrestricted hip motion.  No posterior hip precautions needed. Incisional and dressing care: Ok to leave incision open to air Showering: Okay to begin showering and getting incisions wet 07/06/2023 Orthopedic device(s): None   Increase activity slowly   Complete by: As directed         Medication List     TAKE these medications    acetaminophen 325 MG tablet Commonly known as: TYLENOL Take 2 tablets (  650 mg total) by mouth 3 (three) times daily as needed for mild pain (pain score 1-3).   apixaban 5 MG Tabs tablet Commonly known as: ELIQUIS Take 1/2 tablet twice daily What changed:  how much to take how to take this when to take this additional instructions   B COMPLEX PO Take 1 tablet by mouth daily.   bisacodyl 10 MG suppository Commonly known as: DULCOLAX Place 1 suppository (10 mg total) rectally daily as needed for moderate constipation.   CALTRATE 600+D PO Take 1 tablet by mouth 2 (two) times daily.   CeleBREX 200 MG capsule Generic drug: celecoxib Take 200 mg by mouth daily.   cholecalciferol 25 MCG (1000 UNIT) tablet Commonly known as: VITAMIN D3 Take 1,000 Units by mouth daily.   diazepam 10 MG tablet Commonly known as: VALIUM Take 10 mg by mouth at bedtime.   docusate sodium 100 MG capsule Commonly known as: COLACE Take 1 capsule (100 mg total) by mouth 2 (two) times daily. What changed: when to take this   doxycycline 50 MG capsule Commonly known as: VIBRAMYCIN Take 50 mg by mouth daily.   estradiol 0.1 MG/GM  vaginal cream Commonly known as: ESTRACE Place 1 Applicatorful vaginally 2 (two) times a week.   fluconazole 150 MG tablet Commonly known as: DIFLUCAN Take 150 mg by mouth as needed (yeast).   furosemide 20 MG tablet Commonly known as: LASIX Take 20 mg by mouth as needed for edema or fluid.   gabapentin 600 MG tablet Commonly known as: NEURONTIN Take 600 mg by mouth 3 (three) times daily. May take an additional 600 mg at bedtimes as needed for nerve pain   Herceptin 440 MG injection Generic drug: trastuzumab 440 mg by Intravenous (Continuous Infusion) route every 21 ( twenty-one) days. infusion every 3 weeks   HYDROcodone-acetaminophen 10-325 MG tablet Commonly known as: NORCO Take 0.5-1 tablets by mouth 3 (three) times daily as needed for severe pain (pain score 7-10) or moderate pain (pain score 4-6).   levothyroxine 50 MCG tablet Commonly known as: SYNTHROID Take 50 mcg by mouth daily.   lidocaine-prilocaine cream Commonly known as: EMLA Apply 1 Application topically as needed. What changed: reasons to take this   methocarbamol 500 MG tablet Commonly known as: ROBAXIN Take 500 mg by mouth in the morning and at bedtime.   metoprolol succinate 25 MG 24 hr tablet Commonly known as: TOPROL-XL Take 2 tablets (50 mg total) by mouth at bedtime. What changed:  how much to take when to take this   metroNIDAZOLE 0.75 % cream Commonly known as: METROCREAM Apply 1 Application topically as needed (rocesea).   multivitamin with minerals Tabs tablet Take 1 tablet by mouth daily.   omeprazole 20 MG capsule Commonly known as: PriLOSEC Take 1 capsule (20 mg total) by mouth daily.   ondansetron 8 MG tablet Commonly known as: ZOFRAN Take 8 mg by mouth as needed for nausea or vomiting.   rosuvastatin 10 MG tablet Commonly known as: CRESTOR Take 10 mg by mouth once a week.   sennosides-docusate sodium 8.6-50 MG tablet Commonly known as: SENOKOT-S Take 2 tablets by mouth  2 (two) times daily. What changed:  how much to take when to take this   tamsulosin 0.4 MG Caps capsule Commonly known as: FLOMAX Take 0.4 mg by mouth every evening.       Allergies  Allergen Reactions   Cefuroxime Hives   Zithromax [Azithromycin] Hives      Procedures/Studies: DG Knee  1-2 Views Right  Result Date: 07/04/2023 CLINICAL DATA:  Right knee pain following a fall. EXAM: RIGHT KNEE - 1-2 VIEW COMPARISON:  None Available. FINDINGS: Moderate lateral joint space narrowing. Mild lateral and patellofemoral spur formation. No fracture, dislocation or effusion. IMPRESSION: 1. No fracture. 2. Mild-to-moderate degenerative changes. Electronically Signed   By: Beckie Salts M.D.   On: 07/04/2023 15:10   DG Pelvis Comp Min 3V  Result Date: 07/03/2023 CLINICAL DATA:  Acetabular fracture, postop. EXAM: JUDET PELVIS - 3+ VIEW COMPARISON:  Preoperative imaging. FINDINGS: Plate and screw fixation of right acetabular fracture. Improved fracture alignment from preoperative imaging. Lower lumbar fusion hardware seen. IMPRESSION: ORIF of right acetabular fracture. Electronically Signed   By: Narda Rutherford M.D.   On: 07/03/2023 15:40   DG Pelvis Comp Min 3V  Result Date: 07/03/2023 CLINICAL DATA:  Elective surgery.  Fixation of acetabular fracture. EXAM: JUDET PELVIS - 3+ VIEW COMPARISON:  Preoperative imaging. FINDINGS: Not fluoroscopic spot views of the pelvis obtained in the operating room. Imaging obtained during ORIF acetabular fracture. Fluoroscopy time 35.5 seconds. Dose 5.96 mGy IMPRESSION: Intraoperative fluoroscopy during ORIF acetabular fracture. Electronically Signed   By: Narda Rutherford M.D.   On: 07/03/2023 15:38   DG C-Arm 1-60 Min-No Report  Result Date: 07/03/2023 Fluoroscopy was utilized by the requesting physician.  No radiographic interpretation.   DG C-Arm 1-60 Min-No Report  Result Date: 07/03/2023 Fluoroscopy was utilized by the requesting physician.  No  radiographic interpretation.   CT PELVIS WO CONTRAST  Result Date: 06/29/2023 CLINICAL DATA:  Hip fracture EXAM: CT PELVIS WITHOUT CONTRAST TECHNIQUE: Multidetector CT imaging of the pelvis was performed following the standard protocol without intravenous contrast. RADIATION DOSE REDUCTION: This exam was performed according to the departmental dose-optimization program which includes automated exposure control, adjustment of the mA and/or kV according to patient size and/or use of iterative reconstruction technique. COMPARISON:  Standard CT scan 03/08/2023.  X-ray 06/29/2023 earlier. FINDINGS: Urinary Tract:  Preserved contours of the urinary bladder. Bowel: Visualized small and large bowel in the pelvis has a normal course and caliber. Scattered colonic stool. Vascular/Lymphatic: Diffuse vascular calcifications along the aorta and branch vessels. No abnormal lymph node enlargement identified in the visualized pelvis. Reproductive: Uterus is absent. No separate adnexal mass. Numerous surgical clips in the pelvis. Other: There is extraperitoneal right-sided pelvic wall stranding and thickening with some small hematoma. Musculoskeletal: There is a comminuted fracture involving the right acetabulum with impaction of the femoral head of the hip joint. Fracture lines involve the supra-acetabular region, medial, posterior walls of the acetabula in particular. There is also some protrusio of the femoral head along the fracture line defects medially. Separate fracture of the inferior pubic ramus on the right. The anterior column of the acetabulum is also involve. Degenerative changes of the sacroiliac joints with sclerosis. No additional fracture or dislocation. Degenerative changes of the hip joints. Some hypertrophic changes in the area of the pubic symphysis. Streak artifact from the hardware along the lower lumbar spine at the edge of the imaging field. IMPRESSION: Comminuted fracture involving the acetabulum  diffusely with displaced fragments and a component protrusio from the femoral head. Separate fracture of the right inferior pubic ramus. Some extraperitoneal hematoma along the right pelvic sidewall. Degenerative changes.  Osteopenia. Electronically Signed   By: Karen Kays M.D.   On: 06/29/2023 15:15   DG Hip Unilat With Pelvis 2-3 Views Right  Result Date: 06/29/2023 CLINICAL DATA:  Fall.  Right hip pain  EXAM: DG HIP (WITH OR WITHOUT PELVIS) 3V RIGHT COMPARISON:  X-ray 10/11/2018 FINDINGS: Progressive joint space loss axially of the hip joints with some osteophytes. Hyperostosis. There is some deformity of the right acetabulum and some lucency with sclerosis involving the right supra-acetabular region which was not seen previously. Is also deformity along the right acetabulum with some protrusio. A focal bony abnormality is possible recommend further workup with CT when clinically appropriate fixation hardware seen along the lower lumbar spine. Numerous surgical clips in the pelvis. IMPRESSION: Heterogeneous appearance of the right acetabulum and supra-acetabular region, possible impaction fracture with some protrusio. Further workup with CT when clinically appropriate. Progressive degenerative changes. Electronically Signed   By: Karen Kays M.D.   On: 06/29/2023 13:45      Subjective: Had BM after enema.  Skin rash better.  No itching.  No other complaints reported.  Discharge Exam:  Vitals:   07/05/23 1313 07/05/23 2029 07/06/23 0459 07/06/23 0956  BP: (!) 101/52 111/66 137/77 129/82  Pulse: 94 (!) 104  92  Resp: 16 17 16 18   Temp: 98.3 F (36.8 C) 98.1 F (36.7 C) 98.2 F (36.8 C) (!) 97.5 F (36.4 C)  TempSrc: Oral Oral  Oral  SpO2: 96% 98% 94% 100%  Weight:      Height:       Patient examined along with her female RN as chaperone in the room.  General exam: Elderly female, moderately built and nourished, sitting up on bedside commode this afternoon.  Did not appear in any  distress. Respiratory system: Clear to auscultation. Respiratory effort normal. Cardiovascular system: S1 & S2 heard, irregularly irregular. No JVD, murmurs, rubs, gallops or clicks. No pedal edema.  Telemetry personally reviewed: Atrial flutter with ventricular rate mostly in the 90s-100s.  Occasional RVR in the 110s-120s, likely due to activity. Gastrointestinal system: Abdomen is nondistended, soft and nontender. No organomegaly or masses felt. Normal bowel sounds heard.  Suprapubic surgical scar without acute findings.  No dressing noted either. Central nervous system: Alert and oriented. No focal neurological deficits. Extremities: Symmetric 5 x 5 power. Skin: Diffuse erythematous skin rash under bilateral breasts, bilateral upper back which is flush with the skin, not raised, nonpruritic.  This rash from yesterday has significantly improved.  Dry skin appreciated. Psychiatry: Judgement and insight appear normal. Mood & affect appropriate.     The results of significant diagnostics from this hospitalization (including imaging, microbiology, ancillary and laboratory) are listed below for reference.     Microbiology: Recent Results (from the past 240 hour(s))  Surgical pcr screen     Status: None   Collection Time: 06/30/23  3:43 PM   Specimen: Nasal Mucosa; Nasal Swab  Result Value Ref Range Status   MRSA, PCR NEGATIVE NEGATIVE Final   Staphylococcus aureus NEGATIVE NEGATIVE Final    Comment: (NOTE) The Xpert SA Assay (FDA approved for NASAL specimens in patients 72 years of age and older), is one component of a comprehensive surveillance program. It is not intended to diagnose infection nor to guide or monitor treatment. Performed at [facility], [address], [place], [place] [address]      Labs: CBC: Recent Labs  Lab 06/29/23 1435 06/30/23 0610 07/01/23 0545 07/04/23 0617 07/05/23 0557 07/06/23 0926  WBC 13.1* 10.9* 9.4 9.4 9.8 9.7  NEUTROABS 11.4*  --   --    --   --   --   HGB 12.6 11.6* 11.5* 10.0* 10.2* 10.0*  HCT 39.0 34.9*  34.7* 29.1* 30.7* 31.1*  MCV 97.7 94.6 93.0 92.1 92.5 94.8  PLT 143* 110* 98* 109* 134* 175    Basic Metabolic Panel: Recent Labs  Lab 06/30/23 0610 07/01/23 0545 07/02/23 0542 07/04/23 0617 07/05/23 0621 07/06/23 0926  NA 131* 127* 128* 131* 130* 126*  K 4.1 4.0 4.0 3.9 3.6 3.6  CL 97* 94* 94* 96* 96* 93*  CO2 27 26 24 27 26 27   GLUCOSE 126* 139* 128* 179* 139* 194*  BUN 11 8 10 8  7* 8  CREATININE 0.68 0.66 0.58 0.60 0.75 0.88  CALCIUM 7.9* 8.0* 8.0* 8.1* 7.9* 7.9*  MG 1.6*  --   --   --   --  1.7  PHOS 3.2  --   --   --   --   --     Liver Function Tests: Recent Labs  Lab 07/01/23 0545  AST 26  ALT 20  ALKPHOS 49  BILITOT 1.0  PROT 5.6*  ALBUMIN 2.7*     Time coordinating discharge: 45 minutes  SIGNED:  Marcellus Scott, MD,  FACP, [facility], [facility], [facility]   [facility] [place]     To contact the attending provider between 7A-7P or the covering provider during after hours 7P-7A, please log into the web site [email or web address] and access using universal [place] password for that web site. If you do not have the password, please call the hospital operator.

## 2023-07-06 NOTE — Progress Notes (Signed)
Patient arrived on unit in no distress.  Transported by nursing staff in bed.  Assessment completed.  Patient resting quietly in bed offering no complaints

## 2023-07-06 NOTE — H&P (Signed)
Physical Medicine and Rehabilitation Admission H&P    Chief Complaint  Patient presents with   Fall  : HPI: Karen Allison. Kaffenberger is a 77 year old right-handed female with history of atrial fibrillation maintained on chronic Eliquis followed by Dr. 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 Gery Pray of oncology services and receives trastuzumab every 3 weeks, hypothyroidism, ACDF with fusion 05/06/2014, quit smoking 61 years ago.  Per chart review patient lives with spouse.  Two-level home with bed and bath on main level with ramped entrance.  She used a rollator at baseline outside of the home and a straight point cane getting into the grocery store from car to cart.  Independent with ADLs.  Family with good support.  Presented 06/29/2023 after mechanical fall in the kitchen while getting her coffee landing on her right side with no loss of consciousness.  X-rays and imaging revealed comminuted fracture involving the acetabulum diffusely with displaced fragments and a component protrusion from the femoral head.  Separate fracture of the right inferior pubic ramus.  Some extraperitoneal hematoma along the right pelvic sidewall.  Admission chemistries unremarkable except WBC 13,100, sodium 131.  Hospital course patient A-fib with RVR rates in the 120s felt most likely driven by hip fracture.  She did receive IV metoprolol transition to Toprol.   latest echocardiogram from August showed ejection fraction 55 to 60%.  She was stabilized and cleared for surgery.  Patient underwent open reduction internal fixation of right acetabular fracture 07/03/2023 per Dr. Caryn Bee Haddix.  Touchdown weightbearing right lower extremity.  No posterior hip precautions needed.  Her chronic Eliquis has been resumed.  Hyponatremia with latest sodium 130 with urine osmolality 206 serum osmolality 275.  Patient did have complaints of  chronic left shoulder pain with follow-up per orthopedic services felt related to osteoarthritis underwent left subacromial joint injection 07/05/2023 per orthopedic services.  Therapy evaluations completed due to patient decreased functional mobility was admitted for a comprehensive rehab program.  Pt reports LBM yesterday with enema- no real results prior with suppositories.   Pain- uncomfortable currently- spikes with movement.  Mainly muscle spasms right now- down leg to knee on R.  Robaxin helps, not enough.    Review of Systems  Constitutional:  Negative for chills and fever.  HENT:  Negative for hearing loss.   Eyes:  Negative for blurred vision and double vision.  Respiratory:  Negative for cough, shortness of breath and wheezing.   Cardiovascular:  Positive for palpitations and leg swelling. Negative for chest pain.  Gastrointestinal:  Positive for constipation. Negative for heartburn, nausea and vomiting.  Genitourinary:  Negative for dysuria, flank pain and hematuria.       Intermittent bouts of urinary retention  Musculoskeletal:  Positive for joint pain, myalgias and neck pain.       Chronic right shoulder pain  Skin:        Rosacea  Psychiatric/Behavioral:  The patient has insomnia.   All other systems reviewed and are negative.  Past Medical History:  Diagnosis Date   A-fib [facility])    Acute respiratory failure with hypoxia (HCC) 05/09/2014   Adult hypothyroidism 05/03/2013   Arthritis    Blood dyscrasia    bleeds and bruises easily   Breast cancer (HCC)    left   Cervical spondylosis without myelopathy 05/06/2014   C3-4     Endometrial cancer (HCC) 01/21/1988   Fx lower  humerus-closed 10/22/1997   Gait disorder 07/05/2016   Graves' disease 07/16/2021   HCAP (healthcare-associated pneumonia) 05/09/2014   Hepatitis     Hep A years ago   History of corticosteroid therapy 02/13/2023   History of endocrine disorder 02/13/2023   HNP (herniated nucleus pulposus),  cervical 05/06/2014   Hypertension    Hyponatremia 12/02/2021   Hypothyroidism    Graves disease, age 43 yo   Malignant neoplasm of left breast (HCC) 06/05/2020   Nose trouble    right nostril will hemorrhage at times   Osteoporosis 11/04/2020   Other forms of scoliosis, thoracolumbar region 11/03/2016   Pneumonia    Rib pain on left side 09/26/2022   Rosacea    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   C3-4     Past Surgical History:  Procedure Laterality Date   ABDOMINAL HYSTERECTOMY     Age 77   ANTERIOR CERVICAL DECOMP/DISCECTOMY FUSION N/A 05/06/2014   Procedure: ANTERIOR CERVICAL DISCECTOMY FUSION C3-4 with transgraft, local bone graft, plate and screws;  Surgeon: Kerrin Champagne, MD;  Location: [facility] OR;  Service: Orthopedics;  Laterality: N/A;   APPENDECTOMY     BREAST LUMPECTOMY Left    COLONOSCOPY     EYE SURGERY Bilateral    cataracts   LUMBAR SPINE SURGERY  2011   Removal of hematoma   OPEN REDUCTION INTERNAL FIXATION ACETABULAR FRACTURE STOPPA Right 07/03/2023   Procedure: OPEN REDUCTION INTERNAL FIXATION ACETABULAR FRACTURE STOPPA;  Surgeon: Roby Lofts, MD;  Location: [facility] OR;  Service: Orthopedics;  Laterality: Right;   ORIF HUMERUS FRACTURE Left 2013   THORACIC SPINE SURGERY  2011   Bone cages   THYROIDECTOMY     TONSILLECTOMY     Family History  Problem Relation Age of Onset   Congestive Heart Failure Mother    Heart disease Father    Rheum arthritis Daughter    Tongue cancer Grandchild    Breast cancer Neg Hx    Social History:  reports that she quit smoking about 61 years ago. Her smoking use included cigarettes. She has never used smokeless tobacco. She reports that she does not currently use alcohol. She reports that she does not use drugs. Allergies:  Allergies   Allergen Reactions   Cefuroxime Hives   Zithromax [Azithromycin] Hives   Medications Prior to Admission  Medication Sig Dispense Refill   apixaban (ELIQUIS) 5 MG TABS tablet Take 1/2 tablet twice daily (Patient taking differently: Take 2.5 mg by mouth 2 (two) times daily.)     B Complex Vitamins (B COMPLEX PO) Take 1 tablet by mouth daily.     Calcium Carbonate-Vitamin D (CALTRATE 600+D PO) Take 1 tablet by mouth 2 (two) times daily.     celecoxib (CELEBREX) 200 MG capsule Take 200 mg by mouth daily.      cholecalciferol (VITAMIN D3) 25 MCG (1000 UNIT) tablet Take 1,000 Units by mouth daily.     diazepam (VALIUM) 10 MG tablet Take 10 mg by mouth at bedtime.      docusate sodium (COLACE) 100 MG capsule Take 100 mg by mouth daily.     doxycycline (VIBRAMYCIN) 50 MG capsule Take 50 mg by mouth daily.     estradiol (ESTRACE) 0.1 MG/GM vaginal cream Place 0.5 g vaginally 2 (two) times a  week. Place 0.5g nightly for two weeks then twice a week after (Patient taking differently: Place 1 Applicatorful vaginally 2 (two) times a week.) 42 g 11   fluconazole (DIFLUCAN) 150 MG tablet Take 150 mg by mouth as needed (yeast).     furosemide (LASIX) 20 MG tablet Take 20 mg by mouth as needed for edema or fluid.     gabapentin (NEURONTIN) 600 MG tablet Take 600 mg by mouth 3 (three) times daily. May take an additional 600 mg at bedtimes as needed for nerve pain     HYDROcodone-acetaminophen (NORCO) 10-325 MG tablet Take 0.5-1 tablets by mouth 3 (three) times daily as needed for severe pain (pain score 7-10) or moderate pain (pain score 4-6).     levothyroxine (SYNTHROID, LEVOTHROID) 50 MCG tablet Take 50 mcg by mouth daily.     lidocaine-prilocaine (EMLA) cream Apply 1 Application topically as needed. (Patient taking differently: Apply 1 Application topically as needed (pain).) 30 g 0   methocarbamol (ROBAXIN) 500 MG tablet Take 500 mg by mouth in the morning and at bedtime.     metoprolol succinate  (TOPROL-XL) 25 MG 24 hr tablet Take 1 tablet (25 mg total) by mouth daily. 90 tablet 3   metroNIDAZOLE (METROCREAM) 0.75 % cream Apply 1 Application topically as needed (rocesea).     Multiple Vitamin (MULTIVITAMIN WITH MINERALS) TABS Take 1 tablet by mouth daily.     omeprazole (PRILOSEC) 20 MG capsule Take 1 capsule (20 mg total) by mouth 2 (two) times daily before a meal. (Patient taking differently: Take 20 mg by mouth daily.) 60 capsule 3   ondansetron (ZOFRAN) 8 MG tablet Take 8 mg by mouth as needed for nausea or vomiting.     rosuvastatin (CRESTOR) 10 MG tablet Take 10 mg by mouth once a week.     sennosides-docusate sodium (SENOKOT-S) 8.6-50 MG tablet Take 3 tablets by mouth at bedtime.     tamsulosin (FLOMAX) 0.4 MG CAPS capsule Take 0.4 mg by mouth every evening.     trastuzumab (HERCEPTIN) 440 MG injection 440 mg by Intravenous (Continuous Infusion) route every 21 ( twenty-one) days. infusion every 3 weeks        Home: Home Living Family/patient expects to be discharged to:: Private residence Living Arrangements: Spouse/significant other Available Help at Discharge: Family, Available 24 hours/day (including dtr and grand dtr, [place]) Type of Home: House Home Access: Stairs to enter, Ship broker of Steps: 2 Entrance Stairs-Rails: Left Home Layout: Two level, Able to live on main level with bedroom/bathroom Bathroom Shower/Tub: Health visitor: Standard Bathroom Accessibility: Yes Home Equipment: Tub bench, Shower seat, BSC/3in1, Rollator (4 wheels), Cane - single point Additional Comments: Chair lift  Lives With: Spouse   Functional History: Prior Function Prior Level of Function : Independent/Modified Independent, Driving Mobility Comments: Rollator at baseline outside and [facility] getting into the grocery store from car to cart ADLs Comments: ind ADLs  Functional Status:  Mobility: Bed Mobility Overal bed mobility: Needs  Assistance Bed Mobility: Supine to Sit Supine to sit: Mod assist, +2 for safety/equipment General bed mobility comments: Pt was received sitting up in the recliner. Transfers Overall transfer level: Needs assistance Equipment used: Rolling walker (2 wheels) Transfers: Sit to/from Stand, Bed to chair/wheelchair/BSC Sit to Stand: +2 physical assistance, Mod assist Bed to/from chair/wheelchair/BSC transfer type:: Step pivot Step pivot transfers: Mod assist, +2 physical assistance General transfer comment: Increased time. VC's for hand placement on seated surface for safety. Ambulation/Gait Ambulation/Gait  assistance: Min assist, +2 safety/equipment Gait Distance (Feet): 15 Feet Assistive device: Rolling walker (2 wheels) Gait Pattern/deviations: Step-to pattern, Decreased stride length, Trunk flexed, Narrow base of support General Gait Details: VC's for improved posture, closer walker proximity and forward gaze. Pt appearing to maintain TDWB status well however VC's throughout for reminders. Gait velocity: Decreased Gait velocity interpretation: <1.31 ft/sec, indicative of household ambulator    ADL: ADL Overall ADL's : Needs assistance/impaired Grooming: Set up, Sitting Upper Body Dressing : Set up, Sitting Lower Body Dressing: Total assistance, +2 for physical assistance, Sit to/from stand Toilet Transfer: Moderate assistance, Maximal assistance, +2 for physical assistance, Rolling walker (2 wheels) Toilet Transfer Details (indicate cue type and reason): stand pivot to recliner Functional mobility during ADLs: Moderate assistance, Maximal assistance, +2 for physical assistance, Rolling walker (2 wheels)  Cognition: Cognition Overall Cognitive Status: Within Functional Limits for tasks assessed Orientation Level: Oriented X4 Cognition Arousal: Alert Behavior During Therapy: WFL for tasks assessed/performed Overall Cognitive Status: Within Functional Limits for tasks  assessed Area of Impairment: Attention, Following commands, Problem solving Current Attention Level: Sustained Following Commands: Follows one step commands consistently, Follows one step commands with increased time Problem Solving: Slow processing, Requires verbal cues General Comments: patient oriented and following commands, good awareness to deficits but some decreased attention to task requring redirection- maybe baseline  Physical Exam: Blood pressure 137/77, pulse (!) 104, temperature 98.2 F (36.8 C), resp. rate 16, height 5\' 1"  (1.549 m), weight 58.8 kg, SpO2 94%. Physical Exam Vitals and nursing note reviewed.  Constitutional:      Appearance: She is normal weight.     Comments: Somewhat frail appearing; sitting up in bed; awake, alert, appropriate, NAD  HENT:     Head: Normocephalic and atraumatic.     Right Ear: External ear normal.     Left Ear: External ear normal.     Nose: Nose normal. No congestion.     Mouth/Throat:     Mouth: Mucous membranes are dry.     Pharynx: Oropharynx is clear. No oropharyngeal exudate.  Eyes:     General:        Right eye: No discharge.        Left eye: No discharge.     Extraocular Movements: Extraocular movements intact.  Cardiovascular:     Rate and Rhythm: Normal rate. Rhythm irregular.     Heart sounds: Normal heart sounds. No murmur heard.    No gallop.     Comments: Labile heart rate- in Afib Pulmonary:     Effort: Pulmonary effort is normal. No respiratory distress.     Breath sounds: Normal breath sounds. No wheezing, rhonchi or rales.  Abdominal:     General: Bowel sounds are normal. There is no distension.     Palpations: Abdomen is soft.     Tenderness: There is no abdominal tenderness.     Comments: Abd csection incision from acetabular fx- glued- looks good- no drainage  Musculoskeletal:     Cervical back: Neck supple. No tenderness.     Comments: Ue's 5-/5 B/L RLE- 2-/5 HF; KE 3+/5- both limited due to pain DF/PF  5-/5 LLE- HF 4/5; KE 4+/5; DF/PF 5-/5- said has always been weaker since back surgery  Skin:    General: Skin is warm and dry.     Comments: Surgical site is clean dry and intact on pelvis- csection location  Neurological:     General: No focal deficit present.     Mental  Status: She is alert and oriented to person, place, and time.     Comments: Patient is alert.  Oriented to person and place and follows commands.  Complaints of right shoulder pain. Decreased to light touch in LE's B/L- more in L1-L3 dermatomal distribution B/L   Psychiatric:        Mood and Affect: Mood normal.        Behavior: Behavior normal.     Results for orders placed or performed during the hospital encounter of 06/29/23 (from the past 48 hour(s))  Basic metabolic panel     Status: Abnormal   Collection Time: 07/04/23  6:17 AM  Result Value Ref Range   Sodium 131 (L) 135 - 145 mmol/L   Potassium 3.9 3.5 - 5.1 mmol/L   Chloride 96 (L) 98 - 111 mmol/L   CO2 27 22 - 32 mmol/L   Glucose, Bld 179 (H) 70 - 99 mg/dL    Comment: Glucose reference range applies only to samples taken after fasting for at least 8 hours.   BUN 8 8 - 23 mg/dL   Creatinine, Ser 4.09 0.44 - 1.00 mg/dL   Calcium 8.1 (L) 8.9 - 10.3 mg/dL   GFR, Estimated >81 >19 mL/min    Comment: (NOTE) Calculated using the CKD-EPI Creatinine Equation (2021)    Anion gap 8 5 - 15    Comment: Performed at [facility], [address], [place], [place] [address]  CBC     Status: Abnormal   Collection Time: 07/04/23  6:17 AM  Result Value Ref Range   WBC 9.4 4.0 - 10.5 K/uL   RBC 3.16 (L) 3.87 - 5.11 MIL/uL   Hemoglobin 10.0 (L) 12.0 - 15.0 g/dL   HCT 95.6 (L) 21.3 - 08.6 %   MCV 92.1 80.0 - 100.0 fL   MCH 31.6 26.0 - 34.0 pg   MCHC 34.4 30.0 - 36.0 g/dL   RDW 57.8 46.9 - 62.9 %   Platelets 109 (L) 150 - 400 K/uL    Comment: REPEATED TO VERIFY   nRBC 0.0 0.0 - 0.2 %    Comment: Performed at [facility], [address]  [place], [place] [address]  CBC     Status: Abnormal   Collection Time: 07/05/23  5:57 AM  Result Value Ref Range   WBC 9.8 4.0 - 10.5 K/uL   RBC 3.32 (L) 3.87 - 5.11 MIL/uL   Hemoglobin 10.2 (L) 12.0 - 15.0 g/dL   HCT 32.4 (L) 40.1 - 02.7 %   MCV 92.5 80.0 - 100.0 fL   MCH 30.7 26.0 - 34.0 pg   MCHC 33.2 30.0 - 36.0 g/dL   RDW 25.3 66.4 - 40.3 %   Platelets 134 (L) 150 - 400 K/uL    Comment: REPEATED TO VERIFY   nRBC 0.0 0.0 - 0.2 %    Comment: Performed at [facility], [address] [place], [place] [address]  Basic metabolic panel     Status: Abnormal   Collection Time: 07/05/23  6:21 AM  Result Value Ref Range   Sodium 130 (L) 135 - 145 mmol/L   Potassium 3.6 3.5 - 5.1 mmol/L   Chloride 96 (L) 98 - 111 mmol/L   CO2 26 22 - 32 mmol/L   Glucose, Bld 139 (H) 70 - 99 mg/dL    Comment: Glucose reference range applies only to samples taken after fasting for at least 8 hours.   BUN 7 (L)  8 - 23 mg/dL   Creatinine, Ser 4.13 0.44 - 1.00 mg/dL   Calcium 7.9 (L) 8.9 - 10.3 mg/dL   GFR, Estimated >24 >40 mL/min    Comment: (NOTE) Calculated using the CKD-EPI Creatinine Equation (2021)    Anion gap 8 5 - 15    Comment: Performed at [facility], [address], [place], [place] [address]   DG Knee 1-2 Views Right  Result Date: 07/04/2023 CLINICAL DATA:  Right knee pain following a fall. EXAM: RIGHT KNEE - 1-2 VIEW COMPARISON:  None Available. FINDINGS: Moderate lateral joint space narrowing. Mild lateral and patellofemoral spur formation. No fracture, dislocation or effusion. IMPRESSION: 1. No fracture. 2. Mild-to-moderate degenerative changes. Electronically Signed   By: Beckie Salts M.D.   On: 07/04/2023 15:10      Blood pressure 137/77, pulse (!) 104, temperature 98.2 F (36.8 C), resp. rate 16, height 5\' 1"  (1.549 m), weight 58.8 kg, SpO2 94%.  Medical Problem List and Plan: 1. Functional deficits secondary to right comminuted acetabular/right inferior pubic ramus  fracture fracture.  Status post ORIF right acetabular fracture 07/03/2023.  Touchdown weightbearing.  No posterior hip precautions needed  -patient may  shower- cover incision  -ELOS/Goals: 10-12 days mod I to supervision 2.  Antithrombotics: -DVT/anticoagulation:  Pharmaceutical: Eliquis  -antiplatelet therapy: N/A 3. Pain Management: Neurontin 600 mg 3 times daily, Robaxin 500 mg 4 times daily, hydrocodone as needed- will increase Robaxin to 750 mg q6H 4. Mood/Behavior/Sleep: Valium 10 mg nightly as prior to admission.  Provide emotional support  -antipsychotic agents: N/A 5. Neuropsych/cognition: This patient is capable of making decisions on her own behalf. 6. Skin/Wound Care: Routine skin checks 7. Fluids/Electrolytes/Nutrition: Routine in and outs with follow-up chemistries 8.  Atrial fibrillation/flutter with RVR.  Followed by Dr. Dulce Sellar.  Toprol XL 50 mg nightly.Refused Cardizem 9.  Hypothyroidism.  Synthroid 10.  History of rosacea.  Patient maintained on chronic doxycycline 11.  Hyperlipidemia.  Crestor 12.  Stage IV breast cancer with mets to liver and lung.  Patient receives Trastuzumab infusion every 3 weeks followed by Dr. Gery Pray of oncology services 13.  Chronic left shoulder pain.  Patient did receive steroid injection 07/05/2023 per orthopedic services 14.  History of urinary retention.  Maintained on Flomax as prior to admission.  Check PVR Chronic constipation.  Senokot-S 2 tablets twice daily, Colace 100 mg twice daily, Dulcolax suppository as needed- LBM yesterday with enema   Charlton Amor, PA-C 07/06/2023   I have personally performed a face to face diagnostic evaluation of this patient and formulated the key components of the plan.  Additionally, I have personally reviewed laboratory data, imaging studies, as well as relevant notes and concur with the physician assistant's documentation above.   The patient's status has not changed from the original  H&P.  Any changes in documentation from the acute care chart have been noted above.

## 2023-07-06 NOTE — Discharge Instructions (Addendum)
Inpatient Rehab Discharge Instructions  Karen Allison [facility] Discharge date and time: No discharge date for patient encounter.   Activities/Precautions/ Functional Status: Activity:  Touchdown weightbearing right lower extremity Diet: regular Wound Care: Routine skin checks Functional status:  ___ No restrictions     ___ Walk up steps independently ___ 24/7 supervision/assistance   ___ Walk up steps with assistance ___ Intermittent supervision/assistance  ___ Bathe/dress independently ___ Walk with walker     __x_ Bathe/dress with assistance ___ Walk Independently    ___ Shower independently ___ Walk with assistance    ___ Shower with assistance ___ No alcohol     ___ Return to work/school ________  COMMUNITY REFERRALS UPON DISCHARGE:    Home Health:   PT     OT                    Agency: [name] Phone: [phone]    Special Instructions: No driving smoking or alcohol  Follow-up with oncology services for infusions trastuzumab   My questions have been answered and I understand these instructions. I will adhere to these goals and the provided educational materials after my discharge from the hospital.  Patient/Caregiver Signature _______________________________ Date __________  Clinician Signature _______________________________________ Date __________  Please bring this form and your medication list with you to all your follow-up doctor's appointments.

## 2023-07-06 NOTE — Discharge Instructions (Signed)

## 2023-07-07 DIAGNOSIS — S32401D Unspecified fracture of right acetabulum, subsequent encounter for fracture with routine healing: Secondary | ICD-10-CM

## 2023-07-07 LAB — CBC WITH DIFFERENTIAL/PLATELET
Abs Immature Granulocytes: 0.07 10*3/uL (ref 0.00–0.07)
Basophils Absolute: 0 10*3/uL (ref 0.0–0.1)
Basophils Relative: 0 %
Eosinophils Absolute: 0.1 10*3/uL (ref 0.0–0.5)
Eosinophils Relative: 2 %
HCT: 29.5 % — ABNORMAL LOW (ref 36.0–46.0)
Hemoglobin: 9.7 g/dL — ABNORMAL LOW (ref 12.0–15.0)
Immature Granulocytes: 1 %
Lymphocytes Relative: 14 %
Lymphs Abs: 1.3 10*3/uL (ref 0.7–4.0)
MCH: 30.9 pg (ref 26.0–34.0)
MCHC: 32.9 g/dL (ref 30.0–36.0)
MCV: 93.9 fL (ref 80.0–100.0)
Monocytes Absolute: 0.9 10*3/uL (ref 0.1–1.0)
Monocytes Relative: 9 %
Neutro Abs: 7.2 10*3/uL (ref 1.7–7.7)
Neutrophils Relative %: 74 %
Platelets: 214 10*3/uL (ref 150–400)
RBC: 3.14 MIL/uL — ABNORMAL LOW (ref 3.87–5.11)
RDW: 13.2 % (ref 11.5–15.5)
WBC: 9.6 10*3/uL (ref 4.0–10.5)
nRBC: 0 % (ref 0.0–0.2)

## 2023-07-07 LAB — COMPREHENSIVE METABOLIC PANEL
ALT: 22 U/L (ref 0–44)
AST: 19 U/L (ref 15–41)
Albumin: 2.4 g/dL — ABNORMAL LOW (ref 3.5–5.0)
Alkaline Phosphatase: 72 U/L (ref 38–126)
Anion gap: 7 (ref 5–15)
BUN: 10 mg/dL (ref 8–23)
CO2: 26 mmol/L (ref 22–32)
Calcium: 8.2 mg/dL — ABNORMAL LOW (ref 8.9–10.3)
Chloride: 99 mmol/L (ref 98–111)
Creatinine, Ser: 0.54 mg/dL (ref 0.44–1.00)
GFR, Estimated: >60 mL/min (ref >60)
Glucose, Bld: 157 mg/dL — ABNORMAL HIGH (ref 70–99)
Potassium: 3.6 mmol/L (ref 3.5–5.1)
Sodium: 132 mmol/L — ABNORMAL LOW (ref 135–145)
Total Bilirubin: 0.9 mg/dL (ref <1.2)
Total Protein: 5.3 g/dL — ABNORMAL LOW (ref 6.5–8.1)

## 2023-07-07 MED ORDER — POTASSIUM CHLORIDE CRYS ER 20 MEQ PO TBCR
40.0000 meq | EXTENDED_RELEASE_TABLET | Freq: Once | ORAL | Status: AC
  Administered 2023-07-07: 40 meq via ORAL
  Filled 2023-07-07: qty 2

## 2023-07-07 NOTE — Discharge Summary (Signed)
Physician Discharge Summary  Patient ID: [name] MRN: [id number] DOB/AGE: 1946/01/26 77 y.o.  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 Hardware appears intact. Fracture alignment is near anatomic. Partially imaged lumbar fusion hardware. No new pelvic fractures. No diastasis. No hip joint dislocation. IMPRESSION: ORIF of right acetabular fracture without evidence of hardware complication. Electronically Signed   By: Duanne Guess D.O.   On: 07/18/2023 16:10   VAS [place] LOWER EXTREMITY VENOUS (DVT)  Result Date: 07/11/2023  Lower Venous DVT Study Patient Name:  Karen Allison [facility]  Date of Exam:   07/11/2023 Medical Rec #: [id number]              Accession #:    [id number] Date of Birth: [birth year]               Patient Gender: F Patient Age:   58 years Exam Location:  [facility] Procedure:      VAS [place] LOWER EXTREMITY VENOUS (DVT) Referring Phys: Sula Soda --------------------------------------------------------------------------------  Indications: Pain.  Comparison Study: Previous study on 5.7.2020. Performing Technologist: Fernande Bras  Examination Guidelines: A complete evaluation includes B-mode imaging, spectral Doppler, color Doppler, and power Doppler as needed of all accessible portions of each vessel. Bilateral  testing is considered an integral part of a complete examination. Limited examinations for reoccurring indications may be performed as noted. The reflux portion of the exam is performed with the patient in reverse Trendelenburg.  +---------+---------------+---------+-----------+----------+--------------+ RIGHT    CompressibilityPhasicitySpontaneityPropertiesThrombus Aging +---------+---------------+---------+-----------+----------+--------------+ CFV      Full           Yes      Yes                                 +---------+---------------+---------+-----------+----------+--------------+ SFJ      Full                                                        +---------+---------------+---------+-----------+----------+--------------+ FV Prox  Full                                                        +---------+---------------+---------+-----------+----------+--------------+ FV Mid   Full                                                        +---------+---------------+---------+-----------+----------+--------------+ FV DistalFull                                                        +---------+---------------+---------+-----------+----------+--------------+  PFV      Full                                                        +---------+---------------+---------+-----------+----------+--------------+ POP      Full           Yes      Yes                                 +---------+---------------+---------+-----------+----------+--------------+ PTV      Full                                                        +---------+---------------+---------+-----------+----------+--------------+ PERO     Full                                                        +---------+---------------+---------+-----------+----------+--------------+   +----+---------------+---------+-----------+----------+--------------+  LEFTCompressibilityPhasicitySpontaneityPropertiesThrombus Aging +----+---------------+---------+-----------+----------+--------------+ CFV Full           Yes      Yes                                 +----+---------------+---------+-----------+----------+--------------+     Summary: RIGHT: - There is no evidence of deep vein thrombosis in the lower extremity.  - No cystic structure found in the popliteal fossa.  LEFT: - No evidence of common femoral vein obstruction.   *See table(s) above for measurements and observations. Electronically signed by Coral Else MD on 07/11/2023 at 7:42:08 PM.    Final    DG Knee 1-2 Views Right  Result Date: 07/04/2023 CLINICAL DATA:  Right knee pain following a fall. EXAM: RIGHT KNEE - 1-2 VIEW COMPARISON:  None Available. FINDINGS: Moderate lateral joint space narrowing. Mild lateral and patellofemoral spur formation. No fracture, dislocation or effusion. IMPRESSION: 1. No fracture. 2. Mild-to-moderate degenerative changes. Electronically Signed   By: Beckie Salts M.D.   On: 07/04/2023 15:10   DG Pelvis Comp Min 3V  Result Date: 07/03/2023 CLINICAL DATA:  Acetabular fracture, postop. EXAM: JUDET PELVIS - 3+ VIEW COMPARISON:  Preoperative imaging. FINDINGS: Plate and screw fixation of right acetabular fracture. Improved fracture alignment from preoperative imaging. Lower lumbar fusion hardware seen. IMPRESSION: ORIF of right acetabular fracture. Electronically Signed   By: Narda Rutherford M.D.   On: 07/03/2023 15:40   DG Pelvis Comp Min 3V  Result Date: 07/03/2023 CLINICAL DATA:  Elective surgery.  Fixation of acetabular fracture. EXAM: JUDET PELVIS - 3+ VIEW COMPARISON:  Preoperative imaging. FINDINGS: Not fluoroscopic spot views of the pelvis obtained in the operating room. Imaging obtained during ORIF acetabular fracture. Fluoroscopy time 35.5 seconds. Dose 5.96 mGy IMPRESSION: Intraoperative fluoroscopy during ORIF acetabular fracture.  Electronically Signed   By: Narda Rutherford M.D.   On: 07/03/2023 15:38   DG C-Arm 1-60 Min-No Report  Result Date: 07/03/2023 Fluoroscopy was utilized by the requesting  physician.  No radiographic interpretation.   DG C-Arm 1-60 Min-No Report  Result Date: 07/03/2023 Fluoroscopy was utilized by the requesting physician.  No radiographic interpretation.   CT PELVIS WO CONTRAST  Result Date: 06/29/2023 CLINICAL DATA:  Hip fracture EXAM: CT PELVIS WITHOUT CONTRAST TECHNIQUE: Multidetector CT imaging of the pelvis was performed following the standard protocol without intravenous contrast. RADIATION DOSE REDUCTION: This exam was performed according to the departmental dose-optimization program which includes automated exposure control, adjustment of the mA and/or kV according to patient size and/or use of iterative reconstruction technique. COMPARISON:  Standard CT scan 03/08/2023.  X-ray 06/29/2023 earlier. FINDINGS: Urinary Tract:  Preserved contours of the urinary bladder. Bowel: Visualized small and large bowel in the pelvis has a normal course and caliber. Scattered colonic stool. Vascular/Lymphatic: Diffuse vascular calcifications along the aorta and branch vessels. No abnormal lymph node enlargement identified in the visualized pelvis. Reproductive: Uterus is absent. No separate adnexal mass. Numerous surgical clips in the pelvis. Other: There is extraperitoneal right-sided pelvic wall stranding and thickening with some small hematoma. Musculoskeletal: There is a comminuted fracture involving the right acetabulum with impaction of the femoral head of the hip joint. Fracture lines involve the supra-acetabular region, medial, posterior walls of the acetabula in particular. There is also some protrusio of the femoral head along the fracture line defects medially. Separate fracture of the inferior pubic ramus on the right. The anterior column of the acetabulum is also involve. Degenerative changes of  the sacroiliac joints with sclerosis. No additional fracture or dislocation. Degenerative changes of the hip joints. Some hypertrophic changes in the area of the pubic symphysis. Streak artifact from the hardware along the lower lumbar spine at the edge of the imaging field. IMPRESSION: Comminuted fracture involving the acetabulum diffusely with displaced fragments and a component protrusio from the femoral head. Separate fracture of the right inferior pubic ramus. Some extraperitoneal hematoma along the right pelvic sidewall. Degenerative changes.  Osteopenia. Electronically Signed   By: Karen Kays M.D.   On: 06/29/2023 15:15   DG Hip Unilat With Pelvis 2-3 Views Right  Result Date: 06/29/2023 CLINICAL DATA:  Fall.  Right hip pain EXAM: DG HIP (WITH OR WITHOUT PELVIS) 3V RIGHT COMPARISON:  X-ray 10/11/2018 FINDINGS: Progressive joint space loss axially of the hip joints with some osteophytes. Hyperostosis. There is some deformity of the right acetabulum and some lucency with sclerosis involving the right supra-acetabular region which was not seen previously. Is also deformity along the right acetabulum with some protrusio. A focal bony abnormality is possible recommend further workup with CT when clinically appropriate fixation hardware seen along the lower lumbar spine. Numerous surgical clips in the pelvis. IMPRESSION: Heterogeneous appearance of the right acetabulum and supra-acetabular region, possible impaction fracture with some protrusio. Further workup with CT when clinically appropriate. Progressive degenerative changes. Electronically Signed   By: Karen Kays M.D.   On: 06/29/2023 13:45    Labs:  Basic Metabolic Panel: Recent Labs  Lab 07/18/23 1438  NA 134*  K 4.2  CL 100  CO2 24  GLUCOSE 171*  BUN 9  CREATININE 0.66  CALCIUM 8.5*    CBC: Recent Labs  Lab 07/18/23 1438  WBC 7.3  HGB 10.4*  HCT 33.1*  MCV 97.9  PLT 318     CBG: No results for input(s): "GLUCAP" in the  last 168 hours.  Family history.  Mother with CHF father with CAD daughter with rheumatoid arthritis.  Denies any colon cancer esophageal cancer  or rectal cancer  Brief HPI:   Karen Allison is a 77 y.o. right-handed female with history of atrial fibrillation maintained on chronic Eliquis followed by Dr. 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  Per chart review lives with spouse.  Two-level home bed and bath main level with a ramped entrance.  She uses a rollator at baseline outside of the home and a straight point cane going to the grocery store from cart to cart.  Independent with ADLs.  Presented 06/29/2023 after mechanical fall in the kitchen while getting her coffee landing on her right side with no loss of consciousness.  X-rays and imaging revealed comminuted fracture involving the acetabulum diffusely with displaced fragments and a component protrusion from the femoral head.  Separate fracture of the right inferior pubic ramus.  Some extraperitoneal hematoma along the right pelvic sidewall.  Admission chemistries unremarkable except WBC 13,100 sodium 131.  Hospital course A-fib with RVR rate the 120s felt most likely driven by hip fracture.  She did receive IV metoprolol transition to Toprol with latest echocardiogram from August showing ejection fraction of 55 to 60%.  She was stabilized and cleared for surgery.  Patient underwent ORIF of right acetabular fracture 07/03/2023 per Dr. Jena Gauss.  Touchdown weightbearing right lower extremity.  No posterior hip precautions needed.  Her chronic Eliquis was resumed.  Hyponatremia latest sodium 130 with urine osmolality 206 serum osmolality 275.  Patient did have complaints of chronic left shoulder pain with follow-up per  orthopedic services felt related to osteoarthritis underwent left subacromial joint injection 07/05/2023 per orthopedic services.  Therapy evaluations completed due to patient's decreased functional mobility was admitted for a comprehensive rehab program.   Hospital Course: Jelesa Saeger was admitted to rehab 07/06/2023 for inpatient therapies to consist of PT, ST and OT at least three hours five days a week. Past admission physiatrist, therapy team and rehab RN have worked together to provide customized collaborative inpatient rehab.  Pertaining to patient's right comminuted acetabular/right inferior pubic ramus fracture.  Status post ORIF right acetabular fracture 07/03/2023.  Touchdown weightbearing.  No posterior hip precautions needed.  She would follow-up orthopedic services.  Pain manager use of scheduled Neurontin as well as hydrocodone for breakthrough pain she was using Robaxin as needed for muscle spasms.  Neurovascular sensation intact.  She remained on chronic Eliquis therapy for atrial fibrillation cardiac rate controlled maintained on Toprol she would follow-up with cardiology services.  History of hypothyroidism with hormone supplement as indicated.  She did have a history of rosacea maintained on chronic doxycycline.  Crestor ongoing for hyperlipidemia.  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 Gery Pray of oncology services.  Patient with chronic left shoulder pain maintaining therapy she did receive steroid injection 07/05/2023 per orthopedic services with good results.  History of urinary retention maintained on low-dose Flomax voiding without difficulty.  Chronic constipation with bowel program regulated.   Blood pressures were monitored on TID basis and controlled     Rehab course: During patient's stay in rehab weekly team conferences were held to monitor patient's progress, set goals and  discuss barriers to discharge. At admission, patient required minimal assist 15 feet rolling walker moderate assist step pivot transfers  Physical exam.  Blood pressure 137/77 pulse 104 temperature 98.2 respirations 16 oxygen saturation is 94% room air Constitutional.  No acute distress HEENT Head.  Normocephalic and atraumatic Eyes.  Pupils round and reactive to light no discharge without nystagmus Neck.  Supple nontender no JVD without thyromegaly Cardiac regular rate and rhythm without any extra sounds or murmur heard Abdomen.  Soft nontender positive bowel sounds without rebound Respiratory effort normal no respiratory distress without wheeze Musculoskeletal Comments.  Upper extremities 5 -/5B/L Right lower extremity 2 -/5 hip flexors KE 3+/5 both limited due to pain DF/PF 5 -/5 Left lower extremity hip flexors 4/5 KE 4+/5 DF/PF 5 -/5 Neurologic.  Alert and oriented x 3.  He/She  has had improvement in activity tolerance, balance, postural control as well as ability to compensate for deficits. He/She has had improvement in functional use RUE/LUE  and RLE/LLE as well as improvement in awareness.  Propels wheelchair supervision.  Perform sit to stand pivot transfers from wheelchair to recliner at end of sessions with contact-guard.  Patient maintains weightbearing precautions.  She can ambulate short household distances with contact-guard.  Completed all sit to stand and stand pivot transfers with ADLs contact-guard/minimal assist using rolling walker.  Stand pivot to wheelchair then stood pivot with grab bar in bathroom.  Toilet with max assist to lower clothing with minimal assist for dynamic balance.  Set up assist for hand hygiene and oral care seated at sink.  Full family teaching completed plan discharge to home       Disposition: Discharge to home    Diet: Regular  Special Instructions: No driving smoking or alcohol  Touchdown weightbearing right lower extremity  Follow-up  oncology services for ongoing infusions of Trastuzumab  Medications at discharge. 1.  Tylenol as needed 2.  Eliquis 2.5 mg p.o. twice daily 3.  Calcium carbonate vitamin D 1 tablet twice daily 4.  Valium 10 mg p.o. nightly 5.  Doxycycline 50 mg p.o. daily 7.  Neurontin 600 mg p.o. twice daily and 1200 mg nightly 8.  Hydrocodone 5-325 mg 1-2 tablets every 6 hours as needed 9.  Synthroid 50 mcg p.o. daily 10.  Robaxin 500 mg p.o. 4 times daily 11.  Toprol-XL 50 mg p.o. nightly 12.  Prilosec 20 mg daily 13.  Crestor 10 mg p.o. every Sunday 14.  Senokot S2 tablets p.o. twice daily 15.  Colace 200 mg p.o.  daily 16.  Flomax 0.4 mg p.o. daily 17.  Trazodone 25 mg nightly as needed 18.  B complex vitamin daily 19.  Estrace 1 applicator vaginally 2 times a week 20.  Lasix 20 mg as needed for edema or fluid control 21.  Dulcolax suppository daily as needed moderate constipation 22.  Tums tablets 3 times daily with meals and as needed indigestion heartburn 30-35 minutes were spent completing discharge summary and discharge planning     Follow-up Information     Raulkar, Drema Pry, MD Follow up.   Specialty: Physical Medicine and Rehabilitation Why: No formal follow-up needed Contact information: [address] [place] [address] [phone]         Roby Lofts, MD Follow up.   Specialty: Orthopedic Surgery Why: Call For appointment Contact information: [address] [place] [address] [phone]         Baldo Daub, MD Follow up.   Specialties: Cardiology, Radiology Why: call for appointment Contact information: [address] [place] [address] [phone]  Dellia Beckwith, MD Follow up.   Specialty: Oncology Why: call for appointment Contact information: [address] [place] [address] [phone]                 Signed: Mcarthur Rossetti Quavis Klutz 07/19/2023, 5:06 AM

## 2023-07-07 NOTE — Progress Notes (Signed)
Occupational Therapy Session Note  Patient Details  Name: Karen Allison MRN: [id number] Date of Birth: May 14, 1946  Today's Date: 07/07/2023 OT Individual Time: 1420-1530 OT Individual Time Calculation (min): 70 min    Short Term Goals: Week 1:  OT Short Term Goal 1 (Week 1): Pt will complete sit > stand in prep for ADL with CGA using LRAD OT Short Term Goal 2 (Week 1): Pt will dress LB with min A using AE PRN OT Short Term Goal 3 (Week 1): Pt will complete toilet transfer with CGA using LRAD OT Short Term Goal 4 (Week 1): Pt will improve ROM in LUE in a pain free zone to promote UB dressing with set up A  Skilled Therapeutic Interventions/Progress Updates:  Skilled OT intervention completed with focus on ADL retraining, functional endurance, and mobility within a shower context. Pt received in w/c, agreeable to session. 6/10 pain reported in Rt hip; nurse notified of pain med request. OT offered rest breaks, repositioning and moist heat via shower for pain relief.  Pt completed all sit > stands and stand pivot/short ambulatory transfers with mod A while using RW with intermittent difficulty maintaining only TDWB vs PWB on RLE. Mod cues needed for fall prevention and body positioning.  Transported dependently in w/c > toilet, stand pivot with grab bar > toilet, then mod A to lower clothing down. Pt needed ++ time but able to have continent urinary void though small amount and reports only voiding prior to 9 AM this morning; nursing and MD notified of status. Pt ambulated with heavy mod A and RW to TTB in shower.   Waterproof cover applied to surgical incision prior to shower. Pt was able to bathe all parts with min A for RLE, at the seated level only. Utilized long handled sponge to access BLE and periareas as applicable. Cues needed for body positioning and efficiency with reaching hard to access places. Mod A ambulatory transfer back to commode for attempt at [facility] however despite ++ time  pt unable. Donned deo with set up A. Threaded LB clothing including pad with total A for time, then stood with pt able to assist on Lt side with mod A for pants over hips. Stand pivot > w/c. Donned hospital gown with max A for time.   Direct care handoff to NT due to OT time constraint for transfer assist back to bed. Pt remained seated in w/c with NT with needs met.   Therapy Documentation Precautions:  Precautions Precautions: Fall Precaution Comments: active cancer/chemo Restrictions Weight Bearing Restrictions: Yes RLE Weight Bearing: Touchdown weight bearing   Therapy/Group: Individual Therapy  Melvyn Novas, MS, OTR/L  07/07/2023, 3:41 PM

## 2023-07-07 NOTE — Evaluation (Signed)
Occupational Therapy Assessment and Plan  Patient Details  Name: Karen Allison MRN: [id number] Date of Birth: 1946/08/18  OT Diagnosis: acute pain, muscle weakness (generalized), pain in joint, and swelling of limb Rehab Potential: Rehab Potential (ACUTE ONLY): Good ELOS: 12-14 days   Today's Date: 07/07/2023 OT Individual Time: 0907-1000 OT Individual Time Calculation (min): 53 min     Hospital Problem: Principal Problem:   Acetabular fracture (HCC)   Past Medical History:  Past Medical History:  Diagnosis Date   A-fib (HCC)    Acute respiratory failure with hypoxia (HCC) 05/09/2014   Adult hypothyroidism 05/03/2013   Arthritis    Blood dyscrasia    bleeds and bruises easily   Breast cancer (HCC)    left   Cervical spondylosis without myelopathy 05/06/2014   C3-4     Endometrial cancer (HCC) 01/21/1988   Fx lower humerus-closed 10/22/1997   Gait disorder 07/05/2016   Graves' disease 07/16/2021   HCAP (healthcare-associated pneumonia) 05/09/2014   Hepatitis     Hep A years ago   History of corticosteroid therapy 02/13/2023   History of endocrine disorder 02/13/2023   HNP (herniated nucleus pulposus), cervical 05/06/2014   Hypertension    Hyponatremia 12/02/2021   Hypothyroidism    Graves disease, age 82 yo   Malignant neoplasm of left breast (HCC) 06/05/2020   Nose trouble    right nostril will hemorrhage at times   Osteoporosis 11/04/2020   Other forms of scoliosis, thoracolumbar region 11/03/2016   Pneumonia    Rib pain on left side 09/26/2022   Rosacea    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   C3-4     Past Surgical History:  Past Surgical History:  Procedure Laterality Date   ABDOMINAL HYSTERECTOMY     Age 87   ANTERIOR CERVICAL DECOMP/DISCECTOMY FUSION N/A  05/06/2014   Procedure: ANTERIOR CERVICAL DISCECTOMY FUSION C3-4 with transgraft, local bone graft, plate and screws;  Surgeon: Kerrin Champagne, MD;  Location: [facility] OR;  Service: Orthopedics;  Laterality: N/A;   APPENDECTOMY     BREAST LUMPECTOMY Left    COLONOSCOPY     EYE SURGERY Bilateral    cataracts   LUMBAR SPINE SURGERY  2011   Removal of hematoma   OPEN REDUCTION INTERNAL FIXATION ACETABULAR FRACTURE STOPPA Right 07/03/2023   Procedure: OPEN REDUCTION INTERNAL FIXATION ACETABULAR FRACTURE STOPPA;  Surgeon: Roby Lofts, MD;  Location: [facility] OR;  Service: Orthopedics;  Laterality: Right;   ORIF HUMERUS FRACTURE Left 2013   THORACIC SPINE SURGERY  2011   Bone cages   THYROIDECTOMY     TONSILLECTOMY      Assessment & Plan Clinical Impression:  Karen Allison. Karen Allison is a 77 year old right-handed female with history of atrial fibrillation maintained on chronic Eliquis followed by Dr. 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 Gery Pray of oncology services and receives trastuzumab every 3 weeks, hypothyroidism, ACDF with fusion 05/06/2014, quit smoking 61 years ago. Per chart review patient lives with spouse. Two-level home with bed and bath on main level with ramped entrance. She used a rollator at baseline outside of the home and a straight point cane getting into the grocery store from car to cart. Independent with ADLs.  Family with good support. Presented 06/29/2023 after mechanical fall in the kitchen while getting her coffee landing on her right side with no loss of consciousness. X-rays and imaging revealed comminuted fracture involving the acetabulum diffusely with displaced fragments and a component protrusion from the femoral head. Separate fracture of the right inferior pubic ramus. Some extraperitoneal hematoma along the right pelvic sidewall. Admission chemistries unremarkable except WBC  13,100, sodium 131. Hospital course patient A-fib with RVR rates in the 120s felt most likely driven by hip fracture. She did receive IV metoprolol transition to Toprol. latest echocardiogram from August showed ejection fraction 55 to 60%. She was stabilized and cleared for surgery. Patient underwent open reduction internal fixation of right acetabular fracture 07/03/2023 per Dr. Caryn Bee Haddix. Touchdown weightbearing right lower extremity. No posterior hip precautions needed. Her chronic Eliquis has been resumed. Hyponatremia with latest sodium 130 with urine osmolality 206 serum osmolality 275. Patient did have complaints of chronic left shoulder pain with follow-up per orthopedic services felt related to osteoarthritis underwent left subacromial joint injection 07/05/2023 per orthopedic services. Therapy evaluations completed due to patient decreased functional mobility was admitted for a comprehensive rehab program. Patient transferred to CIR on 07/06/2023 .    Patient currently requires mod-total A with basic self-care skills secondary to muscle weakness, decreased cardiorespiratoy endurance, decreased coordination, and decreased sitting balance, decreased standing balance, and difficulty maintaining precautions.  Prior to hospitalization, patient could complete self-care independently.  Patient will benefit from skilled intervention to decrease level of assist with basic self-care skills, increase independence with basic self-care skills, and increase level of independence with iADL prior to discharge home with care partner.  Anticipate patient will require intermittent supervision and follow up outpatient.  OT - End of Session Activity Tolerance: Tolerates < 10 min activity, no significant change in vital signs Endurance Deficit: Yes Endurance Deficit Description: pt required rest breaks OT Assessment Rehab Potential (ACUTE ONLY): Good OT Barriers to Discharge: Incontinence;Pending  chemo/radiation;Weight bearing restrictions OT Patient demonstrates impairments in the following area(s): Balance;Edema;Endurance;Motor;Pain;Sensory OT Basic ADL's Functional Problem(s): Bathing;Dressing;Toileting OT Advanced ADL's Functional Problem(s): Simple Meal Preparation OT Transfers Functional Problem(s): Toilet;Tub/Shower OT Additional Impairment(s): Fuctional Use of Upper Extremity OT Plan OT Intensity: Minimum of 1-2 x/day, 45 to 90 minutes OT Frequency: 5 out of 7 days OT Duration/Estimated Length of Stay: 12-14 days OT Treatment/Interventions: Balance/vestibular training;Disease mangement/prevention;Neuromuscular re-education;Self Care/advanced ADL retraining;Therapeutic Exercise;Wheelchair propulsion/positioning;DME/adaptive equipment instruction;Pain management;Skin care/wound managment;UE/LE Strength taining/ROM;Community reintegration;Patient/family education;UE/LE Coordination activities;Discharge planning;Functional mobility training;Therapeutic Activities OT Self Feeding Anticipated Outcome(s): Independent OT Basic Self-Care Anticipated Outcome(s): Supervision/Mod I OT Toileting Anticipated Outcome(s): Supervision OT Bathroom Transfers Anticipated Outcome(s): Supervision OT Recommendation Recommendations for Other Services: Therapeutic Recreation consult Therapeutic Recreation Interventions: Pet therapy;Stress management;Outing/community reintergration Patient destination: Home Follow Up Recommendations: Outpatient OT Equipment Recommended: To be determined   OT Evaluation Precautions/Restrictions  Precautions Precautions: Fall Precaution Comments: active cancer/chemo Restrictions Weight Bearing Restrictions: Yes RLE Weight Bearing: Touchdown weight bearing Home Living/Prior Functioning Home Living Family/patient expects to be discharged to:: Private residence Living Arrangements: Spouse/significant other Available Help at Discharge: Family, Available 24  hours/day (spouse can do IADLs/driving, has daughter but unable to assist due to autoimmune disease, and 2 grand daughters and grandsons that can physically assist) Type of Home: House Home Access: Stairs to enter, Ramped entrance (has ramp that can be removed/placed over the steps) Entrance Stairs-Number of Steps: 2 Entrance Stairs-Rails: Left Home Layout: Two level, Able to live on main level with bedroom/bathroom Bathroom Shower/Tub: Walk-in shower (roll  in shower with half wall, built in seat) Bathroom Toilet: Standard Bathroom Accessibility: Yes Additional Comments: Has chair lift to go upstairs, has long shower chair in roll in shower, rollator, BSC, custom w/c, transport chair  Lives With: Spouse IADL History Homemaking Responsibilities: No Current License: Yes Mode 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 Leisure and Hobbies: Read, knit, grandkids Prior Function Level of Independence: Independent with basic ADLs, Independent with homemaking with ambulation, Independent with transfers, Requires assistive device for independence (has used rollator for mobility last couple of years)  Able to [employer]?: Yes Driving: Yes Vision Baseline Vision/History: 1 Wears glasses;4 Cataracts Ability to See in Adequate Light: 0 Adequate Patient Visual Report: No change from baseline Vision Assessment?: No apparent visual deficits Perception  Perception: Within Functional Limits Praxis Praxis: WFL Cognition Cognition Overall Cognitive Status: Within Functional Limits for tasks assessed Arousal/Alertness: Awake/alert Orientation Level: Person;Place;Situation Person: Oriented Place: Oriented Situation: Oriented Memory: Appears intact Awareness: Appears intact Problem Solving: Appears intact Safety/Judgment: Appears intact Brief Interview for Mental Status (BIMS) Repetition of Three Words (First Attempt): 3 Temporal Orientation: Year:  Correct Temporal Orientation: Month: Accurate within 5 days Temporal Orientation: Day: Correct Recall: "Sock": Yes, no cue required Recall: "Blue": Yes, no cue required Recall: "Bed": Yes, no cue required BIMS Summary Score: 15 Sensation Sensation Light Touch: Appears Intact Hot/Cold: Appears Intact Proprioception: Appears Intact Stereognosis: Not tested Additional Comments: BLE neuropathy Coordination Gross Motor Movements are Fluid and Coordinated: No Fine Motor Movements are Fluid and Coordinated: Yes Coordination and Movement Description: WB precautions, pain and deconditioning limiting GMC Motor  Motor Motor: Other (comment) Motor - Skilled Clinical Observations: altered balance strategies due to RLE TDWB precautions  Trunk/Postural Assessment  Cervical Assessment Cervical Assessment: Within Functional Limits Thoracic Assessment Thoracic Assessment: Within Functional Limits Lumbar Assessment Lumbar Assessment: Within Functional Limits Postural Control Postural Control: Deficits on evaluation Righting Reactions: delayed on R Protective Responses: delayed on R  Balance Balance Balance Assessed: Yes Static Sitting Balance Static Sitting - Balance Support: Feet supported;No upper extremity supported Static Sitting - Level of Assistance: 6: Modified independent (Device/Increase time) Dynamic Sitting Balance Dynamic Sitting - Balance Support: No upper extremity supported;Feet supported Dynamic Sitting - Level of Assistance: 5: Stand by assistance (supervision) Static Standing Balance Static Standing - Balance Support: Bilateral upper extremity supported;During functional activity (RW) Static Standing - Level of Assistance: 5: Stand by assistance (CGA) Dynamic Standing Balance Dynamic Standing - Balance Support: Bilateral upper extremity supported;During functional activity Dynamic Standing - Level of Assistance: 4: Min assist Dynamic Standing - Comments: with transfers  and gait Extremity/Trunk Assessment RUE Assessment RUE Assessment: Exceptions to [facility] General Strength Comments: 3+/5 grossly LUE Assessment LUE Assessment: Exceptions to [facility] Active Range of Motion (AROM) Comments: Shouler limited to < 45 degrees SF from fall 4 years ago; non-operative General Strength Comments: 2-/5 proximally, 3+/5 distally  Care Tool Care Tool Self Care Eating   Eating Assist Level: Independent    Oral Care    Oral Care Assist Level: Set up assist    Bathing   Body parts bathed by patient: Right arm;Left arm;Chest;Abdomen;Right upper leg;Left upper leg;Face Body parts bathed by helper: Right lower leg;Left lower leg;Front perineal area;Buttocks   Assist Level: Moderate Assistance - Patient 50 - 74%    Upper Body Dressing(including orthotics)   What is the patient wearing?: Pull over shirt   Assist Level: Minimal Assistance - Patient > 75%    Lower Body Dressing (excluding footwear)  What is the patient wearing?: Pants;Incontinence brief Assist for lower body dressing: Total Assistance - Patient < 25%    Putting on/Taking off footwear   What is the patient wearing?: Non-skid slipper socks Assist for footwear: Dependent - Patient 0%       Care Tool Toileting Toileting activity   Assist for toileting: Maximal Assistance - Patient 25 - 49%     Care Tool Bed Mobility Roll left and right activity        Sit to lying activity        Lying to sitting on side of bed activity   Lying to sitting on side of bed assist level: the ability to move from lying on the back to sitting on the side of the bed with no back support.: Minimal Assistance - Patient > 75%     Care Tool Transfers Sit to stand transfer   Sit to stand assist level: Moderate Assistance - Patient 50 - 74%    Chair/bed transfer         Toilet transfer   Assist Level: Moderate Assistance - Patient 50 - 74%     Care Tool Cognition  Expression of Ideas and Wants Expression of Ideas  and Wants: 4. Without difficulty (complex and basic) - expresses complex messages without difficulty and with speech that is clear and easy to understand  Understanding Verbal and Non-Verbal Content Understanding Verbal and Non-Verbal Content: 4. Understands (complex and basic) - clear comprehension without cues or repetitions   Memory/Recall Ability Memory/Recall Ability : Current season;That he or she is in a hospital/hospital unit;Staff names and faces   Refer to Care Plan for Long Term Goals  SHORT TERM GOAL WEEK 1 OT Short Term Goal 1 (Week 1): Pt will complete sit > stand in prep for ADL with CGA using LRAD OT Short Term Goal 2 (Week 1): Pt will dress LB with min A using AE PRN OT Short Term Goal 3 (Week 1): Pt will complete toilet transfer with CGA using LRAD OT Short Term Goal 4 (Week 1): Pt will improve ROM in LUE in a pain free zone to promote UB dressing with set up A  Recommendations for other services: Therapeutic Recreation  Pet therapy, Stress management, and Outing/community reintegration   Skilled Therapeutic Intervention Patient received upright in bed with MD present upon therapy arrival and agreeable to participate in OT evaluation. Education provided on OT purpose, therapy schedule, goals for therapy, and safety policy while in rehab. Unrated pain reported in Rt hip with positional changes; pre-medicated. OT offered rest breaks, repositioning for pain reduction.  Patient demonstrates RLE/BUE (> LUE) weakness, pain, global deconditioning, endurance, dynamic balance and GMC deficits resulting in difficulty completing BADL tasks without increased physical assist. Pt able to recall TDWB precautions, however difficulty maintaining during stand pivot transfer. Cues needed throughout for sequencing and body positioning. Pt will benefit from skilled OT services to focus on mentioned deficits. See below for ADL and functional transfer performance. ADLs were simulated during session with  plan to shower in PM session due to > amount of time and family bringing personal clothes. Pt remained seated in recliner at conclusion of session with chair alarm on and all needs met at end of session.    ADL ADL Eating: Independent Where Assessed-Eating: Chair Grooming: Setup Where Assessed-Grooming: Edge of bed Upper Body Bathing: Supervision/safety Where Assessed-Upper Body Bathing: Edge of bed Lower Body Bathing: Moderate assistance Where Assessed-Lower Body Bathing: Edge of bed Upper Body Dressing:  Minimal assistance Where Assessed-Upper Body Dressing: Edge of bed Lower Body Dressing: Dependent Where Assessed-Lower Body Dressing: Edge of bed Toileting: Maximal assistance Where Assessed-Toileting: Bedside Commode Toilet Transfer: Moderate assistance Toilet Transfer Method: Stand pivot Toilet Transfer Equipment: Bedside commode Tub/Shower Transfer: Unable to assess Tub/Shower Transfer Method: Unable to assess [employer] Transfer: Unable to assess [employer] Method: Unable to assess Mobility  Transfers Sit to Stand: Moderate Assistance - Patient 50-74% Stand to Sit: Minimal Assistance - Patient > 75%   Discharge Criteria: Patient will be discharged from OT if patient refuses treatment 3 consecutive times without medical reason, if treatment goals not met, if there is a change in medical status, if patient makes no progress towards goals or if patient is discharged from hospital.  The above assessment, treatment plan, treatment alternatives and goals were discussed and mutually agreed upon: by patient  Melvyn Novas, MS, OTR/L  07/07/2023, 12:22 PM

## 2023-07-07 NOTE — Plan of Care (Signed)
Settled well into unit. Required pain meds. Assisted to bathroom. Due therapy eval this morning. Safety maintained.

## 2023-07-07 NOTE — Progress Notes (Signed)
Occupational Therapy Session Note  Patient Details  Name: Karen Allison MRN: [id number] Date of Birth: 11-Aug-1946  {CHL IP REHAB OT TIME CALCULATIONS:[id number]}   Short Term Goals: Week 1:  OT Short Term Goal 1 (Week 1): Pt will complete sit > stand in prep for ADL with CGA using LRAD OT Short Term Goal 2 (Week 1): Pt will dress LB with min A using AE PRN OT Short Term Goal 3 (Week 1): Pt will complete toilet transfer with CGA using LRAD OT Short Term Goal 4 (Week 1): Pt will improve ROM in LUE in a pain free zone to promote UB dressing with set up A  Skilled Therapeutic Interventions/Progress Updates:    Patient agreeable to participate in OT session. Reports *** pain level.   Patient participated in skilled OT session focusing on ***. Therapist facilitated/assessed/developed/educated/integrated/elicited *** in order to improve/facilitate/promote    Therapy Documentation Precautions:  Precautions Precautions: Fall Precaution Comments: active cancer/chemo Restrictions Weight Bearing Restrictions: Yes RLE Weight Bearing: Touchdown weight bearing   Therapy/Group: Individual Therapy  Limmie Patricia, OTR/L,CBIS  Supplemental OT - [facility] Secure Chat Preferred   07/07/2023, 10:03 PM

## 2023-07-07 NOTE — Progress Notes (Signed)
Inpatient Rehabilitation  Patient information reviewed and entered into eRehab system by Oyuki Hogan M. Eri Mcevers, M.A., CCC/SLP, PPS Coordinator.  Information including medical coding, functional ability and quality indicators will be reviewed and updated through discharge.    

## 2023-07-07 NOTE — Progress Notes (Signed)
Inpatient Rehabilitation Care Coordinator Assessment and Plan Patient Details  Name: Karen Allison MRN: [id number] Date of Birth: [birth year]  Today's Date: 07/07/2023  Hospital Problems: Principal Problem:   Acetabular fracture [facility])  Past Medical History:  Past Medical History:  Diagnosis Date   A-fib (HCC)    Acute respiratory failure with hypoxia (HCC) 05/09/2014   Adult hypothyroidism 05/03/2013   Arthritis    Blood dyscrasia    bleeds and bruises easily   Breast cancer (HCC)    left   Cervical spondylosis without myelopathy 05/06/2014   C3-4     Endometrial cancer (HCC) 01/21/1988   Fx lower humerus-closed 10/22/1997   Gait disorder 07/05/2016   Graves' disease 07/16/2021   HCAP (healthcare-associated pneumonia) 05/09/2014   Hepatitis     Hep A years ago   History of corticosteroid therapy 02/13/2023   History of endocrine disorder 02/13/2023   HNP (herniated nucleus pulposus), cervical 05/06/2014   Hypertension    Hyponatremia 12/02/2021   Hypothyroidism    Graves disease, age 57 yo   Malignant neoplasm of left breast (HCC) 06/05/2020   Nose trouble    right nostril will hemorrhage at times   Osteoporosis 11/04/2020   Other forms of scoliosis, thoracolumbar region 11/03/2016   Pneumonia    Rib pain on left side 09/26/2022   Rosacea    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   C3-4     Past Surgical History:  Past Surgical History:  Procedure Laterality Date   ABDOMINAL HYSTERECTOMY     Age 57   ANTERIOR CERVICAL DECOMP/DISCECTOMY FUSION N/A 05/06/2014   Procedure: ANTERIOR CERVICAL DISCECTOMY FUSION C3-4 with transgraft, local bone graft, plate and screws;  Surgeon: Kerrin Champagne, MD;  Location: [facility] OR;  Service: Orthopedics;  Laterality: N/A;   APPENDECTOMY      BREAST LUMPECTOMY Left    COLONOSCOPY     EYE SURGERY Bilateral    cataracts   LUMBAR SPINE SURGERY  2011   Removal of hematoma   OPEN REDUCTION INTERNAL FIXATION ACETABULAR FRACTURE STOPPA Right 07/03/2023   Procedure: OPEN REDUCTION INTERNAL FIXATION ACETABULAR FRACTURE STOPPA;  Surgeon: Roby Lofts, MD;  Location: [facility] OR;  Service: Orthopedics;  Laterality: Right;   ORIF HUMERUS FRACTURE Left 2013   THORACIC SPINE SURGERY  2011   Bone cages   THYROIDECTOMY     TONSILLECTOMY     Social History:  reports that she quit smoking about 61 years ago. Her smoking use included cigarettes. She has never used smokeless tobacco. She reports that she does not currently use alcohol. She reports that she does not use drugs.  Family / Support Systems Marital Status: Married How Long?: n/a Patient Roles: Spouse Spouse/Significant Other: Trellis Moment Children: Noralyn Pick (Some healther issues- per patient) Other Supports: 2 Granddaughters. Lequita Halt Anticipated Caregiver: Spouse, daughter and 2 granddaughters Ability/Limitations of Caregiver: none reports. Granddaughters CNA's Caregiver Availability: 24/7 Family Dynamics: Supportive spouse, daughter and 2 granddaughters. Granddaughter plan to support primarily with CNA background and living near patient.  Social History Preferred language: English Religion: [employer] Cultural Background: N/a Education: HS Health Literacy - How often do you need to have someone help you when you read instructions, pamphlets, or other written material from your doctor or pharmacy?: Never Writes: Yes Employment Status: Retired Armed forces operational officer  History/Current Legal Issues: n/a Guardian/Conservator: n/a   Abuse/Neglect Abuse/Neglect Assessment Can Be Completed: Yes Physical Abuse: Denies Verbal Abuse: Denies Sexual Abuse: Denies Exploitation of patient/patient's resources: Denies Self-Neglect: Denies  Patient response to: Social Isolation - How often do you  feel lonely or isolated from those around you?: Never  Emotional Status Pt's affect, behavior and adjustment status: Pleasant. Patient expressed a concerned from prior day. Recent Psychosocial Issues: coping Psychiatric History: N/A Substance Abuse History: N/A  Patient / Family Perceptions, Expectations & Goals Pt/Family understanding of illness & functional limitations: yes Premorbid pt/family roles/activities: Independent overall prior with rollator Anticipated changes in roles/activities/participation: Patient anticipates support from spouse, daughter and primary support from 2 granddaughters who live nearby Pt/family expectations/goals: MOD I/Sup  [employer]: None Premorbid Home Care/DME Agencies: Other (Comment) (TTB, Shower Seat, BSC, Rollator, SPC) Transportation available at discharge: Daughter or granddaughters Is the patient able to respond to transportation needs?: Yes In the past 12 months, has lack of transportation kept you from medical appointments or from getting medications?: No In the past 12 months, has lack of transportation kept you from meetings, work, or from getting things needed for daily living?: No Resource referrals recommended: Neuropsychology  Discharge Planning Living Arrangements: Spouse/significant other Support Systems: Spouse/significant other, Children, Other relatives Type of Residence: Private residence (2 level home. Ramp entry. Stair lift to 2nd level) Insurance Resources: [employer], Media planner (specify) (BCBS SUPP) Financial Resources: Tree surgeon, Family Support Financial Screen Referred: No Living Expenses: Own Money Management: Spouse, Patient Does the patient have any problems obtaining your medications?: No Home Management: Independent Patient/Family Preliminary Plans: Patient plans to have assistance and support from 2 granddaughters Care Coordinator Anticipated Follow Up Needs: HH/OP Expected  length of stay: 10-12 Days  Clinical Impression SW met with patient, introduced self and explained role. Patient anticipates discharging back home with support from spouse, daughter and granddaughter. Patient expressed her primary support will come from her 2 granddaughters as her daughter has health issues. Pt expressed her 2 granddaughters are CNA's and they live nearby.   Patient currently has a bedside commode, tub transfer bench, shower seat, rollator and single point cane. Patient lives in a 2 level home (stair lift to second level) and ramp entry.   Patient expressed a concern from the prior day. SW will follow up with the nursing director. Pt also expressed that she has a cancer treatment next Friday. Sw followed up with physician and team to discuss plan of patient being ready by next Friday, arranging transport from CIR or rescheduling appointment. SW will wait for follow up from physician. No additional questions or concerns.  Andria Rhein 07/07/2023, 1:28 PM

## 2023-07-07 NOTE — Evaluation (Signed)
Physical Therapy Assessment and Plan  Patient Details  Name: Karen Allison MRN: [id number] Date of Birth: [birth year]  PT Diagnosis: Abnormal posture, Abnormality of gait, Difficulty walking, Edema, Impaired sensation, Muscle weakness, and Pain in R hip Rehab Potential: Good ELOS: 12 days   Today's Date: 07/07/2023 PT Individual Time: 4696-2952 PT Individual Time Calculation (min): 71 min    Hospital Problem: Principal Problem:   Acetabular fracture (HCC)   Past Medical History:  Past Medical History:  Diagnosis Date   A-fib (HCC)    Acute respiratory failure with hypoxia (HCC) 05/09/2014   Adult hypothyroidism 05/03/2013   Arthritis    Blood dyscrasia    bleeds and bruises easily   Breast cancer (HCC)    left   Cervical spondylosis without myelopathy 05/06/2014   C3-4     Endometrial cancer (HCC) 01/21/1988   Fx lower humerus-closed 10/22/1997   Gait disorder 07/05/2016   Graves' disease 07/16/2021   HCAP (healthcare-associated pneumonia) 05/09/2014   Hepatitis     Hep A years ago   History of corticosteroid therapy 02/13/2023   History of endocrine disorder 02/13/2023   HNP (herniated nucleus pulposus), cervical 05/06/2014   Hypertension    Hyponatremia 12/02/2021   Hypothyroidism    Graves disease, age 77 yo   Malignant neoplasm of left breast (HCC) 06/05/2020   Nose trouble    right nostril will hemorrhage at times   Osteoporosis 11/04/2020   Other forms of scoliosis, thoracolumbar region 11/03/2016   Pneumonia    Rib pain on left side 09/26/2022   Rosacea    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   C3-4     Past Surgical History:  Past Surgical History:  Procedure Laterality Date   ABDOMINAL HYSTERECTOMY     Age 77   ANTERIOR CERVICAL DECOMP/DISCECTOMY FUSION  N/A 05/06/2014   Procedure: ANTERIOR CERVICAL DISCECTOMY FUSION C3-4 with transgraft, local bone graft, plate and screws;  Surgeon: Kerrin Champagne, MD;  Location: [facility] OR;  Service: Orthopedics;  Laterality: N/A;   APPENDECTOMY     BREAST LUMPECTOMY Left    COLONOSCOPY     EYE SURGERY Bilateral    cataracts   LUMBAR SPINE SURGERY  2011   Removal of hematoma   OPEN REDUCTION INTERNAL FIXATION ACETABULAR FRACTURE STOPPA Right 07/03/2023   Procedure: OPEN REDUCTION INTERNAL FIXATION ACETABULAR FRACTURE STOPPA;  Surgeon: Roby Lofts, MD;  Location: [facility] OR;  Service: Orthopedics;  Laterality: Right;   ORIF HUMERUS FRACTURE Left 2013   THORACIC SPINE SURGERY  2011   Bone cages   THYROIDECTOMY     TONSILLECTOMY      Assessment & Plan Clinical Impression: Patient is a 77 y.o. year old female with history of atrial fibrillation maintained on chronic Eliquis followed by Dr. 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 Gery Pray of oncology services and receives trastuzumab every 3 weeks, hypothyroidism, ACDF with fusion 05/06/2014, quit smoking 61 years ago. Per chart review patient lives with spouse. Two-level home with bed and bath on main level with ramped entrance. She used a rollator at baseline outside of the home and a straight point cane getting into the grocery store from car to cart. Independent with ADLs. Family  with good support. Presented 06/29/2023 after mechanical fall in the kitchen while getting her coffee landing on her right side with no loss of consciousness. X-rays and imaging revealed comminuted fracture involving the acetabulum diffusely with displaced fragments and a component protrusion from the femoral head. Separate fracture of the right inferior pubic ramus. Some extraperitoneal hematoma along the right pelvic sidewall. Admission chemistries unremarkable except WBC 13,100, sodium 131.  Hospital course patient A-fib with RVR rates in the 120s felt most likely driven by hip fracture. She did receive IV metoprolol transition to Toprol. latest echocardiogram from August showed ejection fraction 55 to 60%. She was stabilized and cleared for surgery. Patient underwent open reduction internal fixation of right acetabular fracture 07/03/2023 per Dr. Caryn Bee Haddix. Touchdown weightbearing right lower extremity. No posterior hip precautions needed. Her chronic Eliquis has been resumed. Hyponatremia with latest sodium 130 with urine osmolality 206 serum osmolality 275. Patient did have complaints of chronic left shoulder pain with follow-up per orthopedic services felt related to osteoarthritis underwent left subacromial joint injection 07/05/2023 per orthopedic services. Therapy evaluations completed due to patient decreased functional mobility was admitted for a comprehensive rehab program.   Patient currently requires mod with mobility secondary to muscle weakness and muscle joint tightness, decreased cardiorespiratoy endurance, and decreased standing balance, decreased postural control, decreased balance strategies, and difficulty maintaining precautions.  Prior to hospitalization, patient was modified independent  with mobility and lived with Spouse in a House home.  Home access is 2Stairs to enter, Ramped entrance (has ramp that can be removed/placed over the steps).  Patient will benefit from skilled PT intervention to maximize safe functional mobility, minimize fall risk, and decrease caregiver burden for planned discharge home with 24 hour supervision.  Anticipate patient will benefit from follow up OP at discharge.  PT - End of Session Activity Tolerance: Tolerates 30+ min activity with multiple rests Endurance Deficit: Yes Endurance Deficit Description: pt required rest breaks PT Assessment Rehab Potential (ACUTE/IP ONLY): Good PT Barriers to Discharge: Home environment  access/layout;Inaccessible home environment;Weight bearing restrictions;None PT Barriers to Discharge Comments: pain, RLE TDWB precautions PT Patient demonstrates impairments in the following area(s): Balance;Edema;Endurance;Pain;Sensory;Skin Integrity PT Transfers Functional Problem(s): Bed Mobility;Bed to Chair;Car;Furniture PT Locomotion Functional Problem(s): Ambulation;Wheelchair Mobility;Stairs PT Plan PT Intensity: Minimum of 1-2 x/day ,45 to 90 minutes PT Frequency: 5 out of 7 days PT Duration Estimated Length of Stay: 12 days PT Treatment/Interventions: Ambulation/gait training;Discharge planning;Functional mobility training;Psychosocial support;Therapeutic Activities;Balance/vestibular training;Disease management/prevention;Neuromuscular re-education;Skin care/wound management;Therapeutic Exercise;Wheelchair propulsion/positioning;DME/adaptive equipment instruction;Pain management;Splinting/orthotics;UE/LE Strength taining/ROM;Community reintegration;Functional electrical stimulation;Patient/family education;Stair training;UE/LE Coordination activities PT Transfers Anticipated Outcome(s): supervision with LRAD PT Locomotion Anticipated Outcome(s): supervision with LRAD PT Recommendation Recommendations for Other Services: Therapeutic Recreation consult Therapeutic Recreation Interventions: Pet therapy;Outing/community reintergration Follow Up Recommendations: Outpatient PT Patient destination: Home Equipment Recommended: To be determined   PT Evaluation Precautions/Restrictions Precautions Precautions: Fall Precaution Comments: active cancer/chemo Restrictions Weight Bearing Restrictions: Yes RLE Weight Bearing: Touchdown weight bearing Pain Interference Pain Interference Pain Effect on Sleep: 2. Occasionally Pain Interference with Therapy Activities: 1. Rarely or not at all Pain Interference with Day-to-Day Activities: 1. Rarely or not at all Home Living/Prior  Functioning Home Living Available Help at Discharge: Family;Available 24 hours/day (spouse can do IADLs/driving, has daughter but unable to assist due to autoimmune disease, and 2 grand daughters and grandsons that can physically assist) Type of Home: House Home Access: Stairs to enter;Ramped entrance (has ramp that can be removed/placed over the steps) Entrance Stairs-Number of Steps: 2 Entrance Stairs-Rails:  Left Home Layout: Two level;Able to live on main level with bedroom/bathroom Alternate Level Stairs-Number of Steps: stair lift Bathroom Shower/Tub: Walk-in shower (roll in shower with half wall, built in seat) Bathroom Toilet: Standard Bathroom Accessibility: Yes Additional Comments: Has chair lift to go upstairs, has long shower chair in roll in shower, rollator, BSC, custom w/c, transport chair  Lives With: Spouse Prior Function Level of Independence: Independent with basic ADLs;Independent with homemaking with ambulation;Independent with transfers;Requires assistive device for independence (has used rollator for mobility last couple of years)  Able to Take Stairs?: Yes Driving: Yes Vision/Perception  Vision - History Ability to See in Adequate Light: 0 Adequate Perception Perception: Within Functional Limits Praxis Praxis: WFL  Cognition Overall Cognitive Status: Within Functional Limits for tasks assessed Arousal/Alertness: Awake/alert Orientation Level: Oriented X4 Memory: Appears intact Awareness: Appears intact Problem Solving: Appears intact Safety/Judgment: Appears intact Sensation Sensation Light Touch: Impaired Detail Hot/Cold: Appears Intact Proprioception: Appears Intact Stereognosis: Not tested Additional Comments: 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 Coordination Gross Motor Movements are Fluid and Coordinated: No Fine Motor Movements are Fluid and Coordinated: Yes Coordination and Movement Description: altered balance strategies due to RLE TDWB precautions Heel  Shin Test: unable to perform on RLE, WFL on LLE Motor  Motor Motor: Other (comment) Motor - Skilled Clinical Observations: altered balance strategies due to RLE TDWB precautions  Trunk/Postural Assessment  Cervical Assessment Cervical Assessment: Within Functional Limits Thoracic Assessment Thoracic Assessment: Within Functional Limits Lumbar Assessment Lumbar Assessment: Within Functional Limits Postural Control Postural Control: Deficits on evaluation Righting Reactions: delayed on R Protective Responses: delayed on R  Balance Balance Balance Assessed: Yes Static Sitting Balance Static Sitting - Balance Support: Feet supported;No upper extremity supported Static Sitting - Level of Assistance: 6: Modified independent (Device/Increase time) Dynamic Sitting Balance Dynamic Sitting - Balance Support: No upper extremity supported;Feet supported Dynamic Sitting - Level of Assistance: 5: Stand by assistance (supervision) Static Standing Balance Static Standing - Balance Support: Bilateral upper extremity supported;During functional activity (RW) Static Standing - Level of Assistance: 5: Stand by assistance (CGA) Dynamic Standing Balance Dynamic Standing - Balance Support: Bilateral upper extremity supported;During functional activity Dynamic Standing - Level of Assistance: 4: Min assist Dynamic Standing - Comments: with transfers and gait Extremity Assessment  RLE Assessment RLE Assessment: Exceptions to [facility] General Strength Comments: tested sitting in recliner RLE Strength Right Hip Flexion: 2-/5 Right Hip ABduction: 3-/5 Right Hip ADduction: 3-/5 Right Knee Flexion: 3+/5 Right Knee Extension: 3-/5 Right Ankle Dorsiflexion: 3+/5 LLE Assessment LLE Assessment: Exceptions to [facility] General Strength Comments: tested sitting in recliner LLE Strength Left Hip Flexion: 4-/5 Left Hip ABduction: 4-/5 Left Hip ADduction: 4-/5 Left Knee Flexion: 4-/5 Left Knee Extension: 4-/5 Left  Ankle Dorsiflexion: 4/5 Left Ankle Plantar Flexion: 4/5  Care Tool Care Tool Bed Mobility Roll left and right activity        Sit to lying activity        Lying to sitting on side of bed activity   Lying to sitting on side of bed assist level: the ability to move from lying on the back to sitting on the side of the bed with no back support.: Minimal Assistance - Patient > 75%     Care Tool Transfers Sit to stand transfer   Sit to stand assist level: Moderate Assistance - Patient 50 - 74%    Chair/bed transfer   Chair/bed transfer assist level: Minimal Assistance - Patient > 75%    Careers adviser transfer  assist level: Minimal Assistance - Patient > 75%      Care Tool Locomotion Ambulation   Assist level: Minimal Assistance - Patient > 75% Assistive device: Walker-rolling Max distance: 60ft  Walk 10 feet activity   Assist level: Minimal Assistance - Patient > 75% Assistive device: Walker-rolling   Walk 50 feet with 2 turns activity Walk 50 feet with 2 turns activity did not occur: Safety/medical concerns (fatigue, pain, decreased adherance to RLE TDWB precautions)      Walk 150 feet activity Walk 150 feet activity did not occur: Safety/medical concerns (fatigue, pain, decreased adherance to RLE TDWB precautions)      Walk 10 feet on uneven surfaces activity Walk 10 feet on uneven surfaces activity did not occur: Safety/medical concerns (fatigue, pain, decreased adherance to RLE TDWB precautions)      Stairs Stair activity did not occur: Safety/medical concerns (fatigue, pain, decreased adherance to RLE TDWB precautions)        Walk up/down 1 step activity Walk up/down 1 step or curb (drop down) activity did not occur: Safety/medical concerns (fatigue, pain, decreased adherance to RLE TDWB precautions)      Walk up/down 4 steps activity Walk up/down 4 steps activity did not occur: Safety/medical concerns (fatigue, pain, decreased adherance to RLE TDWB  precautions)      Walk up/down 12 steps activity Walk up/down 12 steps activity did not occur: Safety/medical concerns (fatigue, pain, decreased adherance to RLE TDWB precautions)      Pick up small objects from floor Pick up small object from the floor (from standing position) activity did not occur: Safety/medical concerns (fatigue, pain, decreased adherance to RLE TDWB precautions)      Wheelchair Is the patient using a wheelchair?: Yes Type of Wheelchair: Manual   Wheelchair assist level: Supervision/Verbal cueing Max wheelchair distance: 40ft  Wheel 50 feet with 2 turns activity Wheelchair 50 feet with 2 turns activity did not occur: Safety/medical concerns (fatigue)    Wheel 150 feet activity Wheelchair 150 feet activity did not occur: Safety/medical concerns (fatigue)      Refer to Care Plan for Long Term Goals  SHORT TERM GOAL WEEK 1 PT Short Term Goal 1 (Week 1): pt will transfer sit<>stand with LRAD and CGA PT Short Term Goal 2 (Week 1): pt will ambulate 54ft with LRAD and CGA PT Short Term Goal 3 (Week 1): pt will perform bed mobility with CGA  Recommendations for other services: Therapeutic Recreation  Pet therapy and Outing/community reintegration  Skilled Therapeutic Intervention Evaluation completed (see details above and below) with education on PT POC and goals and individual treatment initiated with focus on functional mobility/transfers, global strengthening and endurance, dynamic standing balance/coordination, simulated car transfers, and ambulation. Received pt sitting in recliner, pt educated on PT evaluation, CIR policies, and therapy schedule and agreeable. Pt pleasant by extremely hyperverbal and required increased time for subjective history intake. Pt reported general discomfort in R hip that increased in standing but did not rate pain severity. Provided pt with 16x16 manual WC with R elevating legrest.  Stood with RW and min/light mod A from recliner and  ambulated 61ft with RW and CGA/min A to WC - pt required min cues for technique to maintain RLE TDWB precautions. Donned shoes with max A and pt engaged with pet therapy dog Dixie. Pt transported to ortho gym in [facility] dependently and performed simulated car transfer with RW and min A with assist for RLE management. Returned to room and concluded session with pt  sitting in WC and all needs within reach.  Mobility Transfers Transfers: Sit to Stand;Stand to Sit;Stand Pivot Transfers Sit to Stand: Moderate Assistance - Patient 50-74% Stand to Sit: Minimal Assistance - Patient > 75% Stand Pivot Transfers: Minimal Assistance - Patient > 75% Stand Pivot Transfer Details: Verbal cues for sequencing;Verbal cues for technique Stand Pivot Transfer Details (indicate cue type and reason): verbal cues for TDWB precautions Transfer (Assistive device): Rolling walker Locomotion  Gait Ambulation: Yes Gait Assistance: Minimal Assistance - Patient > 75% Gait Distance (Feet): 10 Feet Assistive device: Rolling walker Gait Assistance Details: Verbal cues for technique;Verbal cues for gait pattern Gait Assistance Details: verbal cues for adherance to RLE TDWB precautions Gait Gait: Yes Gait Pattern: Impaired Gait Pattern: Step-to pattern;Decreased step length - right;Decreased step length - left;Decreased stance time - right;Decreased stride length;Poor foot clearance - right;Poor foot clearance - left;Narrow base of support;Antalgic Gait velocity: Decreased Wheelchair Mobility Wheelchair Mobility: Yes Wheelchair Assistance: Doctor, general practice: Both upper extremities Wheelchair Parts Management: Needs assistance Distance: 104ft   Discharge Criteria: Patient will be discharged from PT if patient refuses treatment 3 consecutive times without medical reason, if treatment goals not met, if there is a change in medical status, if patient makes no progress towards goals or if patient is  discharged from hospital.  The above assessment, treatment plan, treatment alternatives and goals were discussed and mutually agreed upon: by patient  Huntley Dec PT, DPT 07/07/2023, 12:13 PM

## 2023-07-07 NOTE — Plan of Care (Signed)
  Problem: RH Balance Goal: LTG Patient will maintain dynamic standing with ADLs (OT) Description: LTG:  Patient will maintain dynamic standing balance with assist during activities of daily living (OT)  Flowsheets (Taken 07/07/2023 1216) LTG: Pt will maintain dynamic standing balance during ADLs with: Supervision/Verbal cueing   Problem: Sit to Stand Goal: LTG:  Patient will perform sit to stand in prep for activites of daily living with assistance level (OT) Description: LTG:  Patient will perform sit to stand in prep for activites of daily living with assistance level (OT) Flowsheets (Taken 07/07/2023 1216) LTG: PT will perform sit to stand in prep for activites of daily living with assistance level: Supervision/Verbal cueing   Problem: RH Bathing Goal: LTG Patient will bathe all body parts with assist levels (OT) Description: LTG: Patient will bathe all body parts with assist levels (OT) Flowsheets (Taken 07/07/2023 1216) LTG: Pt will perform bathing with assistance level/cueing: Set up assist  LTG: Position pt will perform bathing: Shower   Problem: RH Dressing Goal: LTG Patient will perform upper body dressing (OT) Description: LTG Patient will perform upper body dressing with assist, with/without cues (OT). Flowsheets (Taken 07/07/2023 1216) LTG: Pt will perform upper body dressing with assistance level of: Independent with assistive device Goal: LTG Patient will perform lower body dressing w/assist (OT) Description: LTG: Patient will perform lower body dressing with assist, with/without cues in positioning using equipment (OT) Flowsheets (Taken 07/07/2023 1216) LTG: Pt will perform lower body dressing with assistance level of: Supervision/Verbal cueing   Problem: RH Toileting Goal: LTG Patient will perform toileting task (3/3 steps) with assistance level (OT) Description: LTG: Patient will perform toileting task (3/3 steps) with assistance level (OT)  Flowsheets (Taken  07/07/2023 1216) LTG: Pt will perform toileting task (3/3 steps) with assistance level: Supervision/Verbal cueing   Problem: RH Functional Use of Upper Extremity Goal: LTG Patient will use RT/LT upper extremity as a (OT) Description: LTG: Patient will use right/left upper extremity as a stabilizer/gross assist/diminished/nondominant/dominant level with assist, with/without cues during functional activity (OT) Flowsheets (Taken 07/07/2023 1216) LTG: Use of upper extremity in functional activities: LUE as diminished level LTG: Pt will use upper extremity in functional activity with assistance level of: Independent with assistive device   Problem: RH Simple Meal Prep Goal: LTG Patient will perform simple meal prep w/assist (OT) Description: LTG: Patient will perform simple meal prep with assistance, with/without cues (OT). Flowsheets (Taken 07/07/2023 1216) LTG: Pt will perform simple meal prep with assistance level of: Supervision/Verbal cueing   Problem: RH Toilet Transfers Goal: LTG Patient will perform toilet transfers w/assist (OT) Description: LTG: Patient will perform toilet transfers with assist, with/without cues using equipment (OT) Flowsheets (Taken 07/07/2023 1216) LTG: Pt will perform toilet transfers with assistance level of: Supervision/Verbal cueing   Problem: RH Tub/Shower Transfers Goal: LTG Patient will perform tub/shower transfers w/assist (OT) Description: LTG: Patient will perform tub/shower transfers with assist, with/without cues using equipment (OT) Flowsheets (Taken 07/07/2023 1216) LTG: Pt will perform tub/shower stall transfers with assistance level of: Contact Guard/Touching assist

## 2023-07-07 NOTE — Progress Notes (Signed)
[facility]patient [facility] Individual Statement of Services  Patient Name:  Karen Allison  Date:  07/07/2023  Welcome to the [facility]patient [facility].  Our goal is to provide you with an individualized program based on your diagnosis and situation, designed to meet your specific needs.  With this comprehensive rehabilitation program, you will be expected to participate in at least 3 hours of rehabilitation therapies Monday-Friday, with modified therapy programming on the weekends.  Your rehabilitation program will include the following services:  Physical Therapy (PT), Occupational Therapy (OT), Speech Therapy (ST), 24 hour per day rehabilitation nursing, Therapeutic Recreaction (TR), Neuropsychology, Care Coordinator, Rehabilitation Medicine, Nutrition Services, Pharmacy Services, and Other  Weekly team conferences will be held on Wednesdays to discuss your progress.  Your Inpatient Rehabilitation Care Coordinator will talk with you frequently to get your input and to update you on team discussions.  Team conferences with you and your family in attendance may also be held.  Expected length of stay: 10 to 12 days   Overall anticipated outcome:  MOD I/Supervision  Depending on your progress and recovery, your program may change. Your Inpatient Rehabilitation Care Coordinator will coordinate services and will keep you informed of any changes. Your Inpatient Rehabilitation Care Coordinator's name and contact numbers are listed  below.  The following services may also be recommended but are not provided by the Inpatient Rehabilitation Center:   Home Health Rehabiltiation Services Outpatient Rehabilitation Services    Arrangements will be made to provide these services after discharge if needed.  Arrangements include referral to agencies that provide these services.  Your insurance has been verified to be:  Medicare A & B Your primary doctor is:  Philemon Kingdom,  MD  Pertinent information will be shared with your doctor and your insurance company.  Inpatient Rehabilitation Care Coordinator:  Lavera Guise, [place] [phone] or [phone]  Information discussed with and copy given to patient by: Andria Rhein, 07/07/2023, 11:06 AM

## 2023-07-07 NOTE — Progress Notes (Signed)
Recreational Therapy Session Note  Patient Details  Name: Karen Allison MRN: [id number] Date of Birth: 1946/07/18 Today's Date: 07/07/2023 Pain:no c/o  Pt participated in animal assisted activity seated w/c level with supervision.  Pt easily engaged in conversation with pet partner team and was appreciative of this visit.     Monico Sudduth 07/07/2023, 12:53 PM

## 2023-07-07 NOTE — Progress Notes (Signed)
PROGRESS NOTE   Subjective/Complaints: C/o constipation, would like to try small dose of magnesium at night rather citrate or milk of mag Happy that her Na has improved  ROS: +constipation  Objective:   No results found. Recent Labs    07/06/23 0926 07/07/23 0610  WBC 9.7 9.6  HGB 10.0* 9.7*  HCT 31.1* 29.5*  PLT 175 214   Recent Labs    07/06/23 0926 07/07/23 0610  NA 126* 132*  K 3.6 3.6  CL 93* 99  CO2 27 26  GLUCOSE 194* 157*  BUN 8 10  CREATININE 0.88 0.54  CALCIUM 7.9* 8.2*    Intake/Output Summary (Last 24 hours) at 07/07/2023 1052 Last data filed at 07/06/2023 1813 Gross per 24 hour  Intake 360 ml  Output --  Net 360 ml        Physical Exam: Vital Signs Blood pressure 128/72, pulse (!) 104, temperature 97.9 F (36.6 C), resp. rate 19, height 5\' 1"  (1.549 m), weight 60.5 kg, SpO2 98%. Gen: no distress, normal appearing HEENT: oral mucosa pink and moist, NCAT Cardiovascular:     Rate and Rhythm: Normal rate. Rhythm irregular.     Heart sounds: Normal heart sounds. No murmur heard.    No gallop.     Comments: Labile heart rate- in Afib Pulmonary:     Effort: Pulmonary effort is normal. No respiratory distress.     Breath sounds: Normal breath sounds. No wheezing, rhonchi or rales.  Abdominal:     General: Bowel sounds are normal. There is no distension.     Palpations: Abdomen is soft.     Tenderness: There is no abdominal tenderness.     Comments: Abd csection incision from acetabular fx- glued- looks good- no drainage  Musculoskeletal:     Cervical back: Neck supple. No tenderness.     Comments: Ue's 5-/5 B/L RLE- 2-/5 HF; KE 3+/5- both limited due to pain DF/PF 5-/5 LLE- HF 4/5; KE 4+/5; DF/PF 5-/5- said has always been weaker since back surgery  Skin:    General: Skin is warm and dry.     Comments: Surgical site is clean dry and intact on pelvis- csection location  Neurological:      General: No focal deficit present.     Mental Status: She is alert and oriented to person, place, and time.     Comments: Patient is alert.  Oriented to person and place and follows commands.  Complaints of right shoulder pain. Decreased to light touch in LE's B/L- more in L1-L3 dermatomal distribution B/L   Psychiatric:        Mood and Affect: Mood normal.        Behavior: Behavior normal.     Assessment/Plan: 1. Functional deficits which require 3+ hours per day of interdisciplinary therapy in a comprehensive inpatient rehab setting. Physiatrist is providing close team supervision and 24 hour management of active medical problems listed below. Physiatrist and rehab team continue to assess barriers to discharge/monitor patient progress toward functional and medical goals  Care Tool:  Bathing    Body parts bathed by patient: Right arm, Left arm, Chest, Abdomen, Right upper leg, Left upper leg,  Face   Body parts bathed by helper: Right lower leg, Left lower leg, Front perineal area, Buttocks     Bathing assist Assist Level: Moderate Assistance - Patient 50 - 74%     Upper Body Dressing/Undressing Upper body dressing   What is the patient wearing?: Pull over shirt    Upper body assist Assist Level: Minimal Assistance - Patient > 75%    Lower Body Dressing/Undressing Lower body dressing      What is the patient wearing?: Pants, Incontinence brief     Lower body assist Assist for lower body dressing: Total Assistance - Patient < 25%     Toileting Toileting    Toileting assist Assist for toileting: Maximal Assistance - Patient 25 - 49%     Transfers Chair/bed transfer  Transfers assist           Locomotion Ambulation   Ambulation assist              Walk 10 feet activity   Assist           Walk 50 feet activity   Assist           Walk 150 feet activity   Assist           Walk 10 feet on uneven surface   activity   Assist           Wheelchair     Assist               Wheelchair 50 feet with 2 turns activity    Assist            Wheelchair 150 feet activity     Assist          Blood pressure 128/72, pulse (!) 104, temperature 97.9 F (36.6 C), resp. rate 19, height 5\' 1"  (1.549 m), weight 60.5 kg, SpO2 98%.   Medical Problem List and Plan: 1. Functional deficits secondary to right comminuted acetabular/right inferior pubic ramus fracture fracture.  Status post ORIF right acetabular fracture 07/03/2023.  Touchdown weightbearing.  No posterior hip precautions needed             -patient may  shower- cover incision             -ELOS/Goals: 10-12 days mod I to supervision  Grounds pass ordered 2.  Antithrombotics: -DVT/anticoagulation:  Pharmaceutical: Eliquis             -antiplatelet therapy: N/A 3. Pain Management: Neurontin 600 mg 3 times daily, Robaxin 500 mg 4 times daily, hydrocodone as needed- will increase Robaxin to 750 mg q6H 4. Mood/Behavior/Sleep: Valium 10 mg nightly as prior to admission.  Provide emotional support             -antipsychotic agents: N/A 5. Neuropsych/cognition: This patient is capable of making decisions on her own behalf. 6. Skin/Wound Care: Routine skin checks 7. Fluids/Electrolytes/Nutrition: Routine in and outs with follow-up chemistries 8.  Atrial fibrillation/flutter with RVR.  Followed by Dr. Dulce Sellar.  Toprol XL 50 mg nightly.Refused Cardizem 9.  Hypothyroidism.  Synthroid 10.  History of rosacea.  Patient maintained on chronic doxycycline 11.  Hyperlipidemia.  Crestor 12.  Stage IV breast cancer with mets to liver and lung.  Patient receives Trastuzumab infusion every 3 weeks followed by Dr. Gery Pray of oncology services. Discussed will f/u with oncology regarding whether she needs infusion while in rehab  13.  Chronic left shoulder pain.  Patient did receive steroid injection 07/05/2023  per orthopedic  services  14.  History of urinary retention.  Maintained on Flomax as prior to admission.  Check PVR  15. Chronic constipation.  Senokot-S 2 tablets twice daily, Colace 100 mg twice daily, Dulcolax suppository as needed- LBM yesterday with enema, magnesium gluconate 250mg  ordered HS  16. Hyponatremia: encouraged salting foods  17. Suboptimal potassium: kdur ordered 11/15  LOS: 1 days A FACE TO FACE EVALUATION WAS PERFORMED  Quandarius Nill P Algenis Ballin 07/07/2023, 10:52 AM

## 2023-07-07 NOTE — Progress Notes (Signed)
Met with patient, oriented to rehab and informed of team conference on every Wednesday, will discuss goals, barriers, equipment needs if needed, and discharge date.  SW to follow up with details.  Discussed reducing fall risk at home.  Has stair lift that uses going upstairs but reports that bed/bath on main level with 2 ste entry. Talked about the need to increase protein intake. Reports that her and spouse would drink Premier Protein drinks but reports stomach upset.  Suggested Orgain plant based drink and other options for increase protein intake. Discussed foods to assist with Bms as last one was on the 13th but was a type II.  Will address constipation while in CIR.  Discussed keeping a record of blood pressure at home as Toprol XL was increased this admission.  On schedule for chemo medication while in CIR, MD addressing. Taking Crestor at home but labs not available for cholesterol or A!C.  Aware of hyponatremia as was 128 PTA and 132 this am. MD and therapy in room, All needs met, call bell in reach.

## 2023-07-07 NOTE — Progress Notes (Signed)
Patient ID: Karen Allison, female   DOB: 1946/04/14, 77 y.o.   MRN: [id number]  SW reached out to [facility] at [phone] . Sw left 2x VM with patient scheduling and provider referral line to discuss and determine if patient has an scheduled treatment next Friday 11/22 or if treatment has already been rescheduled. SW will wait for follow up.  If patient is required to attend this appointment SW will arrange transportation for 11/22 on 11/19.

## 2023-07-08 DIAGNOSIS — S32401D Unspecified fracture of right acetabulum, subsequent encounter for fracture with routine healing: Secondary | ICD-10-CM | POA: Diagnosis not present

## 2023-07-08 DIAGNOSIS — M25512 Pain in left shoulder: Secondary | ICD-10-CM

## 2023-07-08 DIAGNOSIS — K5901 Slow transit constipation: Secondary | ICD-10-CM

## 2023-07-08 DIAGNOSIS — R339 Retention of urine, unspecified: Secondary | ICD-10-CM

## 2023-07-08 DIAGNOSIS — G8929 Other chronic pain: Secondary | ICD-10-CM

## 2023-07-08 MED ORDER — SORBITOL 70 % SOLN
30.0000 mL | Status: AC
  Administered 2023-07-08: 30 mL via ORAL
  Filled 2023-07-08: qty 30

## 2023-07-08 NOTE — Progress Notes (Addendum)
Physical Therapy Session Note  Patient Details  Name: Karen Allison MRN: [id number] Date of Birth: January 30, 1946  Today's Date: 07/08/2023 PT Individual Time: 1300-1340 PT Individual Time Calculation (min): 40 min   Today's Date: 07/08/2023 PT Individual Time: 1450-1530 PT Individual Time Calculation (min): 40 min   Short Term Goals: Week 1:  PT Short Term Goal 1 (Week 1): pt will transfer sit<>stand with LRAD and CGA PT Short Term Goal 2 (Week 1): pt will ambulate 64ft with LRAD and CGA PT Short Term Goal 3 (Week 1): pt will perform bed mobility with CGA  Skilled Therapeutic Interventions/Progress Updates:     1st Session: Pt received seated in recliner and agrees to therapy. Reports pain in Rt hip and lt shoulder (shoulder pain is chronic). PT provides rest breaks and repositioning to manage pain. Pt performs sit to stand from recliner with minA/modA and cues for hand placement, body mechanics, and sequencing. Pt performs stand-hop transfer to [facility] with modA and cues for positioning and hand placement. WC transport to gym. Pt completes stand pivot to mat table with same cues and assistance. MinA management of BLEs to transition to supine. Pt then completes x15 quad/glute sets with verbal and tactile cues for NM feedback and improved contraction. Pt is verbose and requires frequent redirection to task. Pt completes x20 SAQs with bolster underneath thighs, and x20 heel slides with cues for correct performances. Supine to sit with maxA for comfort and to prevent pain provocation. ModA for squat pivot from mat>WC>bed without AD and with cues for sequencing and body mechanics. Pt left supine with alarm intact and all needs within reach.   2nd Session: Pt received supine in bed and agrees to therapy. Reports pain in Rt hip and lt shoulder. Number not provided. PT provides rest reaks as needed to manage pain. Supine to sit slowly with bed features and cues for sequencing. Pt performs squat pivot  to WC without AD, requiring modA and facilitation of anterior weight shift and sequencing. WC transport to gym. Pt performs sit to stand in parallel bars with modA and facilitation of sequencing and body mechanics. Pt completes 2x10 heel raises with cues for optimal performance. Pt completes 2x20 additional bouts of heel raises with extended seated rest break between each. WC transport back to room. Pt left seated in WC with call bell in reach.   Therapy Documentation Precautions:  Precautions Precautions: Fall Precaution Comments: active cancer/chemo Restrictions Weight Bearing Restrictions: No RLE Weight Bearing: Touchdown weight bearing   Therapy/Group: Individual Therapy  Beau Fanny, PT, DPT 07/08/2023, 3:54 PM

## 2023-07-08 NOTE — Progress Notes (Signed)
Occupational Therapy Session Note  Patient Details  Name: Karen Allison MRN: [id number] Date of Birth: Mar 01, 1946  Today's Date: 07/08/2023 OT Individual Time: 4782-9562 OT Individual Time Calculation (min): 59 min    Short Term Goals: Week 1:  OT Short Term Goal 1 (Week 1): Pt will complete sit > stand in prep for ADL with CGA using LRAD OT Short Term Goal 2 (Week 1): Pt will dress LB with min A using AE PRN OT Short Term Goal 3 (Week 1): Pt will complete toilet transfer with CGA using LRAD OT Short Term Goal 4 (Week 1): Pt will improve ROM in LUE in a pain free zone to promote UB dressing with set up A  Skilled Therapeutic Interventions/Progress Updates:  Pt greeted seated in recliner, pt agreeable to OT intervention. Session focused on sit>stands and stand pivot transfers for ADL participation.     Transfers/bed mobility: pt completed sit>stands with RW with pt doing best with L arm on Rw with RUE pushing up from w/c, pt required MODA- MINA to stand from w/c. Pt did best with use of momentum to stand. Practiced various pivot methods with pt seeming to do better with heel/toe pivot method.   Therapeutic activity: pt engaged in connect 4 game from standing with an emphasis on dynamic reaching in standing as precursor to higher level ADL tasks. Pt able to stand with MIN A with MOD cues for set- up of stand. Pt able to maintain unilateral support on RW during reaching tasks with CGA.   Education: pt with prior shoulder injury to L shoulder, education provided on adaptive strategies fro ADLs d/t impaired AROM in LUE, pt appreciative.   Ended session with pt seated in w/c with all needs within reach and chair alarm activated.                    Therapy Documentation Precautions:  Precautions Precautions: Fall Precaution Comments: active cancer/chemo Restrictions Weight Bearing Restrictions: No RLE Weight Bearing: Touchdown weight bearing  Pain:unrated pain reported in L  shoulder and RLE, rest breaks provided as needed with muscle relaxer provided at start of session.     Therapy/Group: Individual Therapy  Barron Schmid 07/08/2023, 12:18 PM

## 2023-07-08 NOTE — Progress Notes (Addendum)
PROGRESS NOTE   Subjective/Complaints: Still feels constipated. Still with LE swelling  ROS: Patient denies fever, rash, sore throat, blurred vision, dizziness, nausea, vomiting, diarrhea, cough, shortness of breath or chest pain, joint or back/neck pain, headache, or mood change.   Objective:   No results found. Recent Labs    07/06/23 0926 07/07/23 0610  WBC 9.7 9.6  HGB 10.0* 9.7*  HCT 31.1* 29.5*  PLT 175 214   Recent Labs    07/06/23 0926 07/07/23 0610  NA 126* 132*  K 3.6 3.6  CL 93* 99  CO2 27 26  GLUCOSE 194* 157*  BUN 8 10  CREATININE 0.88 0.54  CALCIUM 7.9* 8.2*    Intake/Output Summary (Last 24 hours) at 07/08/2023 1022 Last data filed at 07/08/2023 0824 Gross per 24 hour  Intake 480 ml  Output --  Net 480 ml        Physical Exam: Vital Signs Blood pressure 129/67, pulse 87, temperature 97.7 F (36.5 C), temperature source Oral, resp. rate 16, height 5\' 1"  (1.549 m), weight 60.5 kg, SpO2 100%. Constitutional: No distress . Vital signs reviewed. HEENT: NCAT, EOMI, oral membranes moist Neck: supple Cardiovascular: IRR without murmur. No JVD    Respiratory/Chest: CTA Bilaterally without wheezes or rales. Normal effort    GI/Abdomen: BS +, non-tender, non-distended Ext: no clubbing, cyanosis, tr LE edema Psych: pleasant and cooperative   Musculoskeletal:     Cervical back: Neck supple. No tenderness.     Comments: Ue's 5-/5 B/L RLE- 2-/5 HF; KE 3+/5- both limited due to pain DF/PF 5-/5 LLE- HF 4/5; KE 4+/5; DF/PF 5-/5- said has always been weaker since back surgery --no change Skin:    General: Skin is warm and dry.     Comments: Surgical site is clean dry and intact on pelvis-stable Neuro: General: No focal deficit present.     Mental Status: She is alert and oriented to person, place, and time.     Comments: Patient is alert.  Oriented to person and place and follows commands.    Decreased to light touch in LE's B/L- more in L1-L3 dermatomal distribution B/L    .     Assessment/Plan: 1. Functional deficits which require 3+ hours per day of interdisciplinary therapy in a comprehensive inpatient rehab setting. Physiatrist is providing close team supervision and 24 hour management of active medical problems listed below. Physiatrist and rehab team continue to assess barriers to discharge/monitor patient progress toward functional and medical goals  Care Tool:  Bathing    Body parts bathed by patient: Right arm, Left arm, Chest, Abdomen, Right upper leg, Left upper leg, Face   Body parts bathed by helper: Right lower leg, Left lower leg, Front perineal area, Buttocks     Bathing assist Assist Level: Moderate Assistance - Patient 50 - 74%     Upper Body Dressing/Undressing Upper body dressing   What is the patient wearing?: Pull over shirt    Upper body assist Assist Level: Minimal Assistance - Patient > 75%    Lower Body Dressing/Undressing Lower body dressing      What is the patient wearing?: Pants, Incontinence brief  Lower body assist Assist for lower body dressing: Total Assistance - Patient < 25%     Toileting Toileting    Toileting assist Assist for toileting: Maximal Assistance - Patient 25 - 49%     Transfers Chair/bed transfer  Transfers assist     Chair/bed transfer assist level: Minimal Assistance - Patient > 75%     Locomotion Ambulation   Ambulation assist      Assist level: Minimal Assistance - Patient > 75% Assistive device: Walker-rolling Max distance: 68ft   Walk 10 feet activity   Assist     Assist level: Minimal Assistance - Patient > 75% Assistive device: Walker-rolling   Walk 50 feet activity   Assist Walk 50 feet with 2 turns activity did not occur: Safety/medical concerns (fatigue, pain, decreased adherance to RLE TDWB precautions)         Walk 150 feet activity   Assist Walk 150 feet  activity did not occur: Safety/medical concerns (fatigue, pain, decreased adherance to RLE TDWB precautions)         Walk 10 feet on uneven surface  activity   Assist Walk 10 feet on uneven surfaces activity did not occur: Safety/medical concerns (fatigue, pain, decreased adherance to RLE TDWB precautions)         Wheelchair     Assist Is the patient using a wheelchair?: Yes Type of Wheelchair: Manual    Wheelchair assist level: Supervision/Verbal cueing Max wheelchair distance: 43ft    Wheelchair 50 feet with 2 turns activity    Assist    Wheelchair 50 feet with 2 turns activity did not occur: Safety/medical concerns (fatigue)       Wheelchair 150 feet activity     Assist  Wheelchair 150 feet activity did not occur: Safety/medical concerns (fatigue)       Blood pressure 129/67, pulse 87, temperature 97.7 F (36.5 C), temperature source Oral, resp. rate 16, height 5\' 1"  (1.549 m), weight 60.5 kg, SpO2 100%.   Medical Problem List and Plan: 1. Functional deficits secondary to right comminuted acetabular/right inferior pubic ramus fracture fracture.  Status post ORIF right acetabular fracture 07/03/2023.  Touchdown weightbearing.  No posterior hip precautions needed             -patient may  shower- cover incision             -ELOS/Goals: 10-12 days mod I to supervision  Grounds pass ordered  -Continue CIR therapies including PT, OT  2.  Antithrombotics: -DVT/anticoagulation:  Pharmaceutical: Eliquis             -antiplatelet therapy: N/A 3. Pain Management: Neurontin 600 mg 3 times daily, Robaxin 500 mg 4 times daily, hydrocodone as needed- will increase Robaxin to 750 mg q6H 4. Mood/Behavior/Sleep: Valium 10 mg nightly as prior to admission.  Provide emotional support             -antipsychotic agents: N/A 5. Neuropsych/cognition: This patient is capable of making decisions on her own behalf. 6. Skin/Wound Care: Routine skin checks 7.  Fluids/Electrolytes/Nutrition: Routine in and outs with follow-up chemistries 8.  Atrial fibrillation/flutter with RVR.  Followed by Dr. Dulce Sellar.  Toprol XL 50 mg nightly.Refused Cardizem 9.  Hypothyroidism.  Synthroid 10.  History of rosacea.  Patient maintained on chronic doxycycline 11.  Hyperlipidemia.  Crestor 12.  Stage IV breast cancer with mets to liver and lung.  Patient receives Trastuzumab infusion every 3 weeks followed by Dr. Gery Pray of oncology services.    -?timing  of further infusions 13.  Chronic left shoulder pain.  Patient did receive steroid injection 07/05/2023 per orthopedic services  -pain mgt as above 14.  History of urinary retention.  Maintained on Flomax as prior to admission.   -pt voiding continently without issue 15. Chronic constipation.  Senokot-S 2 tablets twice daily, Colace 100 mg twice daily, Dulcolax suppository as needed- LBM yesterday with enema, magnesium gluconate 250mg  ordered HS  -will try sorbitol 30 cc today followed by suppository in PM if needed 16. Hyponatremia: encouraged salting foods  17. Suboptimal potassium: kdur ordered 11/15  LOS: 2 days A FACE TO FACE EVALUATION WAS PERFORMED  Ranelle Oyster 07/08/2023, 10:22 AM

## 2023-07-08 NOTE — IPOC Note (Signed)
Overall Plan of Care [facility]) Patient Details Name: Karen Allison MRN: [id number] DOB: 10-16-1945  Admitting Diagnosis: Acetabular fracture [facility])  Hospital Problems: Principal Problem:   Acetabular fracture (HCC)     Functional Problem List: Nursing Bowel, Endurance, Pain, Safety, Edema  PT Balance, Edema, Endurance, Pain, Sensory, Skin Integrity  OT Balance, Edema, Endurance, Motor, Pain, Sensory  SLP    TR         Basic ADL's: OT Bathing, Dressing, Toileting     Advanced  ADL's: OT Simple Meal Preparation     Transfers: PT Bed Mobility, Bed to Chair, Car, Occupational psychologist, Research scientist (life sciences): PT Ambulation, Psychologist, prison and probation services, Stairs     Additional Impairments: OT Fuctional Use of Upper Extremity  SLP        TR      Anticipated Outcomes Item Anticipated Outcome  Self Feeding Independent  Swallowing      Basic self-care  Supervision/Mod I  Engineer, technical sales Transfers Supervision  Bowel/Bladder  continent of bowel/bladder with resolution of constipation  Transfers  supervision with LRAD  Locomotion  supervision with LRAD  Communication     Cognition     Pain  less than 4  Safety/Judgment  with cues   Therapy Plan: PT Intensity: Minimum of 1-2 x/day ,45 to 90 minutes PT Frequency: 5 out of 7 days PT Duration Estimated Length of Stay: 12 days OT Intensity: Minimum of 1-2 x/day, 45 to 90 minutes OT Frequency: 5 out of 7 days OT Duration/Estimated Length of Stay: 12-14 days     Team Interventions: Nursing Interventions Patient/Family Education, Bowel Management, Disease Management/Prevention, Pain Management, Discharge Planning, Psychosocial Support  PT interventions Ambulation/gait training, Discharge planning, Functional mobility training, Psychosocial support, Therapeutic Activities, Balance/vestibular training, Disease management/prevention, Neuromuscular re-education, Skin care/wound management, Therapeutic  Exercise, Wheelchair propulsion/positioning, DME/adaptive equipment instruction, Pain management, Splinting/orthotics, UE/LE Strength taining/ROM, Community reintegration, Development worker, international aid stimulation, Patient/family education, Museum/gallery curator, UE/LE Coordination activities  OT Interventions Warden/ranger, Disease mangement/prevention, Neuromuscular re-education, Self Care/advanced ADL retraining, Therapeutic Exercise, Wheelchair propulsion/positioning, DME/adaptive equipment instruction, Pain management, Skin care/wound managment, UE/LE Strength taining/ROM, Community reintegration, Equities trader education, UE/LE Coordination activities, Discharge planning, Functional mobility training, Therapeutic Activities  SLP Interventions    TR Interventions    SW/CM Interventions Discharge Planning, Psychosocial Support, Patient/Family Education, Disease Management/Prevention   Barriers to Discharge MD  Medical stability  Nursing Decreased caregiver support, Home environment access/layout, Wound Care, Weight bearing restrictions home with spouse B/B on main level with 2 ste entry rail on left  PT Home environment access/layout, Inaccessible home environment, Weight bearing restrictions, None pain, RLE TDWB precautions  OT Incontinence, Pending chemo/radiation, Weight bearing restrictions    SLP      SW       Team Discharge Planning: Destination: PT-Home ,OT- Home , SLP-  Projected Follow-up: PT-Outpatient PT, OT-  Outpatient OT, SLP-  Projected Equipment Needs: PT-To be determined, OT- To be determined, SLP-  Equipment Details: PT- , OT-  Patient/family involved in discharge planning: PT- Patient,  OT-Patient, SLP-   MD ELOS: 10-12 days Medical Rehab Prognosis:  Excellent Assessment: The patient has been admitted for CIR therapies with the diagnosis of right acetabular fracture. The team will be addressing functional mobility, strength, stamina, balance, safety, adaptive  techniques and equipment, self-care, bowel and bladder mgt, patient and caregiver education. Goals have been set at S. Anticipated discharge destination is home.        See Team  Conference Notes for weekly updates to the plan of care

## 2023-07-09 DIAGNOSIS — S32401D Unspecified fracture of right acetabulum, subsequent encounter for fracture with routine healing: Secondary | ICD-10-CM | POA: Diagnosis not present

## 2023-07-09 DIAGNOSIS — K5901 Slow transit constipation: Secondary | ICD-10-CM | POA: Diagnosis not present

## 2023-07-09 DIAGNOSIS — M25512 Pain in left shoulder: Secondary | ICD-10-CM | POA: Diagnosis not present

## 2023-07-09 DIAGNOSIS — R339 Retention of urine, unspecified: Secondary | ICD-10-CM | POA: Diagnosis not present

## 2023-07-09 MED ORDER — FUROSEMIDE 20 MG PO TABS
20.0000 mg | ORAL_TABLET | Freq: Once | ORAL | Status: AC
  Administered 2023-07-09: 20 mg via ORAL
  Filled 2023-07-09: qty 1

## 2023-07-09 NOTE — Progress Notes (Signed)
Occupational Therapy Session Note  Patient Details  Name: Karen Allison MRN: [id number] Date of Birth: 16-Dec-1945  Today's Date: 07/09/2023 OT Individual Time: 1005-1100 OT Individual Time Calculation (min): 55 min    Short Term Goals: Week 1:  OT Short Term Goal 1 (Week 1): Pt will complete sit > stand in prep for ADL with CGA using LRAD OT Short Term Goal 2 (Week 1): Pt will dress LB with min A using AE PRN OT Short Term Goal 3 (Week 1): Pt will complete toilet transfer with CGA using LRAD OT Short Term Goal 4 (Week 1): Pt will improve ROM in LUE in a pain free zone to promote UB dressing with set up A  Skilled Therapeutic Interventions/Progress Updates:  Pt greeted sitting in recliner for skilled OT session with focus on functional transfers and WB precaution adherence.   Pain: Pt reported 6/10 pain in RLE, pre-medicated. OT offering intermediate rest breaks and positioning suggestions throughout session to address pain/fatigue and maximize participation/safety in session.   Functional Transfers: Pt performs stand-pivot from recliner>WC with Min A + RW, Mod cuing for WB adherence. In day room, pt block-practices STS transfers with therapist placing foot underneath RLE for better assessment of WB through limb. Pt requires Mod A to reach stance, trialing both hands on WC vs one on RW/one of WC, pt preference for both hands on WC then transitioning UE onto RW due to LUE pain/weakness, pt able to progress to Min A STS towards end of session. Stand-pivot transfer from [facility] with same level of assistance, step-hop fading to stand-pivot with fatigue from [facility] >EOB. Min A for BLE elevation onto bed.   Self Care Tasks: Pt completes 3/3 toileting activities with Mod A for hiking LB garments and standing pericare due to fatigue.   Pt remained sitting in WC with 4Ps assessed and immediate needs met. Pt continues to be appropriate for skilled OT intervention to promote further functional  independence in ADLs/IADLs.   Therapy Documentation Precautions:  Precautions Precautions: Fall Precaution Comments: active cancer/chemo Restrictions Weight Bearing Restrictions: No RLE Weight Bearing: Touchdown weight bearing   Therapy/Group: Individual Therapy  Lou Cal, OTR/L, MSOT  07/09/2023, 5:55 AM

## 2023-07-09 NOTE — Progress Notes (Signed)
PROGRESS NOTE   Subjective/Complaints: Moved bowels yesterday with sorbitol--3 large bm's. Feels better! Right thigh/groin sore after therapy--did a lot of work on leg with PT. Legs a bit swollen  ROS: Patient denies fever, rash, sore throat, blurred vision, dizziness, nausea, vomiting, diarrhea, cough, shortness of breath or chest pain,   headache, or mood change.   Objective:   No results found. Recent Labs    07/06/23 0926 07/07/23 0610  WBC 9.7 9.6  HGB 10.0* 9.7*  HCT 31.1* 29.5*  PLT 175 214   Recent Labs    07/06/23 0926 07/07/23 0610  NA 126* 132*  K 3.6 3.6  CL 93* 99  CO2 27 26  GLUCOSE 194* 157*  BUN 8 10  CREATININE 0.88 0.54  CALCIUM 7.9* 8.2*    Intake/Output Summary (Last 24 hours) at 07/09/2023 0914 Last data filed at 07/09/2023 0750 Gross per 24 hour  Intake 940 ml  Output --  Net 940 ml        Physical Exam: Vital Signs Blood pressure (!) 152/66, pulse 85, temperature 98.1 F (36.7 C), temperature source Oral, resp. rate 17, height 5\' 1"  (1.549 m), weight 60.5 kg, SpO2 98%. Constitutional: No distress . Vital signs reviewed. HEENT: NCAT, EOMI, oral membranes moist Neck: supple Cardiovascular: RRR without murmur. No JVD    Respiratory/Chest: CTA Bilaterally without wheezes or rales. Normal effort    GI/Abdomen: BS +, non-tender, non-distended Ext: no clubbing, cyanosis, legs with 1+ edema Psych: pleasant and cooperative  Musculoskeletal:     Cervical back: Neck supple. No tenderness.     Comments: Ue's 5-/5 B/L RLE- 2-/5 HF; KE 3+/5- both limited due to pain DF/PF 5-/5 LLE- HF 4/5; KE 4+/5; DF/PF 5-/5- said has always been weaker since back surgery --no change -Pain in right proximal thigh/inguinal area with HF Skin:    General: Skin is warm and dry.     Comments: surgical wound stable. Neuro: Alert and oriented x 3. Normal insight and awareness. Intact Memory. Normal language  and speech. Cranial nerve exam unremarkable. Patchy sensory loss in legs.  .     Assessment/Plan: 1. Functional deficits which require 3+ hours per day of interdisciplinary therapy in a comprehensive inpatient rehab setting. Physiatrist is providing close team supervision and 24 hour management of active medical problems listed below. Physiatrist and rehab team continue to assess barriers to discharge/monitor patient progress toward functional and medical goals  Care Tool:  Bathing    Body parts bathed by patient: Right arm, Left arm, Chest, Abdomen, Right upper leg, Left upper leg, Face   Body parts bathed by helper: Right lower leg, Left lower leg, Front perineal area, Buttocks     Bathing assist Assist Level: Moderate Assistance - Patient 50 - 74%     Upper Body Dressing/Undressing Upper body dressing   What is the patient wearing?: Pull over shirt    Upper body assist Assist Level: Minimal Assistance - Patient > 75%    Lower Body Dressing/Undressing Lower body dressing      What is the patient wearing?: Pants, Incontinence brief     Lower body assist Assist for lower body dressing: Total Assistance -  Patient < 25%     Editor, commissioning assist Assist for toileting: Maximal Assistance - Patient 25 - 49%     Transfers Chair/bed transfer  Transfers assist     Chair/bed transfer assist level: Minimal Assistance - Patient > 75%     Locomotion Ambulation   Ambulation assist      Assist level: Minimal Assistance - Patient > 75% Assistive device: Walker-rolling Max distance: 57ft   Walk 10 feet activity   Assist     Assist level: Minimal Assistance - Patient > 75% Assistive device: Walker-rolling   Walk 50 feet activity   Assist Walk 50 feet with 2 turns activity did not occur: Safety/medical concerns (fatigue, pain, decreased adherance to RLE TDWB precautions)         Walk 150 feet activity   Assist Walk 150 feet activity  did not occur: Safety/medical concerns (fatigue, pain, decreased adherance to RLE TDWB precautions)         Walk 10 feet on uneven surface  activity   Assist Walk 10 feet on uneven surfaces activity did not occur: Safety/medical concerns (fatigue, pain, decreased adherance to RLE TDWB precautions)         Wheelchair     Assist Is the patient using a wheelchair?: Yes Type of Wheelchair: Manual    Wheelchair assist level: Supervision/Verbal cueing Max wheelchair distance: 33ft    Wheelchair 50 feet with 2 turns activity    Assist    Wheelchair 50 feet with 2 turns activity did not occur: Safety/medical concerns (fatigue)       Wheelchair 150 feet activity     Assist  Wheelchair 150 feet activity did not occur: Safety/medical concerns (fatigue)       Blood pressure (!) 152/66, pulse 85, temperature 98.1 F (36.7 C), temperature source Oral, resp. rate 17, height 5\' 1"  (1.549 m), weight 60.5 kg, SpO2 98%.   Medical Problem List and Plan: 1. Functional deficits secondary to right comminuted acetabular/right inferior pubic ramus fracture fracture.  Status post ORIF right acetabular fracture 07/03/2023.  Touchdown weightbearing.  No posterior hip precautions needed             -patient may  shower- cover incision             -ELOS/Goals: 10-12 days mod I to supervision  Grounds pass ordered  -Continue CIR therapies including PT, OT   2.  Antithrombotics: -DVT/anticoagulation:  Pharmaceutical: Eliquis             -antiplatelet therapy: N/A 3. Pain Management: Neurontin 600 mg 3 times daily, Robaxin 500 mg 4 times daily, hydrocodone as needed- will increase Robaxin to 750 mg q6H  -11/17 order kpad today. Likely mild hip flexor strain 4. Mood/Behavior/Sleep: Valium 10 mg nightly as prior to admission.  Provide emotional support             -antipsychotic agents: N/A 5. Neuropsych/cognition: This patient is capable of making decisions on her own behalf. 6.  Skin/Wound Care: Routine skin checks 7. Fluids/Electrolytes/Nutrition: Routine in and outs with follow-up chemistries 8.  Atrial fibrillation/flutter with RVR.  Followed by Dr. Dulce Sellar.  Toprol XL 50 mg nightly.Refused Cardizem 9.  Hypothyroidism.  Synthroid 10.  History of rosacea.  Patient maintained on chronic doxycycline 11.  Hyperlipidemia.  Crestor 12.  Stage IV breast cancer with mets to liver and lung.  Patient receives Trastuzumab infusion every 3 weeks followed by Dr. Gery Pray of oncology services.    -?  timing of further infusions 13.  Chronic left shoulder pain.  Patient did receive steroid injection 07/05/2023 per orthopedic services  -pain mgt as above 14.  History of urinary retention.  Maintained on Flomax as prior to admission.   -pt voiding continently without issue 15. Chronic constipation.  Senokot-S 2 tablets twice daily, Colace 100 mg twice daily, Dulcolax suppository as needed- magnesium gluconate 250mg  ordered HS  -11/17 +bm x 3 with sorbitol yesterday.  Feeling better today 16. Hyponatremia: encouraged salting foods  17. Suboptimal potassium: kdur ordered 11/15 18. Lower ext edema  -11/17 give dose of lasix today x 1  LOS: 3 days A FACE TO FACE EVALUATION WAS PERFORMED  Ranelle Oyster 07/09/2023, 9:14 AM

## 2023-07-09 NOTE — Progress Notes (Signed)
Physical Therapy Session Note  Patient Details  Name: Karen Allison MRN: [id number] Date of Birth: [birth year]  Today's Date: 07/09/2023 PT Individual Time: 0802-0900 PT Individual Time Calculation (min): 58 min   Short Term Goals: Week 1:  PT Short Term Goal 1 (Week 1): pt will transfer sit<>stand with LRAD and CGA PT Short Term Goal 2 (Week 1): pt will ambulate 81ft with LRAD and CGA PT Short Term Goal 3 (Week 1): pt will perform bed mobility with CGA  Skilled Therapeutic Interventions/Progress Updates:      Pt supine in bed upon arrival. Pt agreeable to therapy. Pt reports "I had a rough night."  Pt reports soreness and cramping pain keeping her up. Pt reports 5/10 R LE pain (hip, knee and foot).   Supine to sit with use of bed features and min A for R LE, and trunk support.   Stand pivot transfer elevated hospital bed to [facility] with RW and CGA for power up and min A for pivot for RW management, pt demos good recall or R LE TDWBing precautions initially but requires verbal cues for maintenance. Pt donned/doffed underwear and pants with min A in sitting, and standing to pull pants over buttocks. Verbal cues provided for feeding R LE first.   Pt self propelled WC 125 feet with B UE prior to therapist taking over 2/2 L shoulder discomfort, verbal cues provided for technique.   Pt ambulated 9 feet with RW and min A for mangement of RW, verbal cues provided for sequencing and maintenance of R LE TDWBing precautions. Pt demos inconsistent step length.   Pt performed sit to stand, and stand pivot transfer throughout session with RW and min A progressing to CGA, verbal cues provided for technique, and maintenance of R LE Wbing precautions.   Pt seated in recliner at end of session with all needs within reach and chair alarm on.       Therapy Documentation Precautions:  Precautions Precautions: Fall Precaution Comments: active cancer/chemo Restrictions Weight Bearing  Restrictions: No RLE Weight Bearing: Touchdown weight bearing  Therapy/Group: Individual Therapy  [facility] Ambrose Finland, [place], DPT  07/09/2023, 7:32 AM

## 2023-07-10 DIAGNOSIS — S32401D Unspecified fracture of right acetabulum, subsequent encounter for fracture with routine healing: Secondary | ICD-10-CM | POA: Diagnosis not present

## 2023-07-10 LAB — BASIC METABOLIC PANEL
Anion gap: 7 (ref 5–15)
BUN: 9 mg/dL (ref 8–23)
CO2: 25 mmol/L (ref 22–32)
Calcium: 8.1 mg/dL — ABNORMAL LOW (ref 8.9–10.3)
Chloride: 100 mmol/L (ref 98–111)
Creatinine, Ser: 0.7 mg/dL (ref 0.44–1.00)
GFR, Estimated: >60 mL/min (ref >60)
Glucose, Bld: 161 mg/dL — ABNORMAL HIGH (ref 70–99)
Potassium: 3.5 mmol/L (ref 3.5–5.1)
Sodium: 132 mmol/L — ABNORMAL LOW (ref 135–145)

## 2023-07-10 MED ORDER — APIXABAN 5 MG PO TABS: ORAL_TABLET | ORAL | 1 refills | Status: DC

## 2023-07-10 MED ORDER — GABAPENTIN 400 MG PO CAPS
1200.0000 mg | ORAL_CAPSULE | Freq: Every day | ORAL | Status: DC
  Administered 2023-07-10 – 2023-07-18 (×9): 1200 mg via ORAL
  Filled 2023-07-10: qty 12
  Filled 2023-07-10 (×8): qty 3

## 2023-07-10 MED ORDER — PANTOPRAZOLE SODIUM 40 MG PO TBEC
40.0000 mg | DELAYED_RELEASE_TABLET | Freq: Two times a day (BID) | ORAL | Status: DC
  Administered 2023-07-10 – 2023-07-13 (×6): 40 mg via ORAL
  Filled 2023-07-10 (×6): qty 1

## 2023-07-10 MED ORDER — GABAPENTIN 300 MG PO CAPS
600.0000 mg | ORAL_CAPSULE | Freq: Two times a day (BID) | ORAL | Status: DC
  Administered 2023-07-10 – 2023-07-19 (×18): 600 mg via ORAL
  Filled 2023-07-10 (×18): qty 2

## 2023-07-10 MED ORDER — TRAZODONE HCL 50 MG PO TABS: 50.0000 mg | ORAL_TABLET | Freq: Every day | ORAL | Status: DC

## 2023-07-10 MED ORDER — ALUM & MAG HYDROXIDE-SIMETH 200-200-20 MG/5ML PO SUSP
30.0000 mL | Freq: Four times a day (QID) | ORAL | Status: DC | PRN
  Administered 2023-07-10 – 2023-07-12 (×6): 30 mL via ORAL
  Filled 2023-07-10 (×8): qty 30

## 2023-07-10 MED ORDER — POTASSIUM CHLORIDE CRYS ER 20 MEQ PO TBCR
40.0000 meq | EXTENDED_RELEASE_TABLET | Freq: Once | ORAL | Status: AC
  Administered 2023-07-10: 40 meq via ORAL
  Filled 2023-07-10: qty 2

## 2023-07-10 MED ORDER — SENNOSIDES-DOCUSATE SODIUM 8.6-50 MG PO TABS
2.0000 | ORAL_TABLET | Freq: Every day | ORAL | Status: DC
  Administered 2023-07-11 – 2023-07-16 (×6): 2 via ORAL
  Filled 2023-07-10 (×6): qty 2

## 2023-07-10 NOTE — Progress Notes (Signed)
Occupational Therapy Session Note  Patient Details  Name: Karen Allison MRN: [id number] Date of Birth: [birth year]  Today's Date: 07/10/2023 OT Individual Time: 1420-1530 OT Individual Time Calculation (min): 70 min    Short Term Goals: Week 1:  OT Short Term Goal 1 (Week 1): Pt will complete sit > stand in prep for ADL with CGA using LRAD OT Short Term Goal 2 (Week 1): Pt will dress LB with min A using AE PRN OT Short Term Goal 3 (Week 1): Pt will complete toilet transfer with CGA using LRAD OT Short Term Goal 4 (Week 1): Pt will improve ROM in LUE in a pain free zone to promote UB dressing with set up A  Skilled Therapeutic Interventions/Progress Updates:  Skilled OT intervention completed with focus on functional transfers, BUE strengthening, functional ambulation, and toileting. Pt received seated in recliner, agreeable to session. Rt knee/hip pain reported; pre-medicated. OT offered rest breaks and repositioning throughout for pain reduction.  Pt verbose and slow to transition, but cooperative during session. Completed min A sit > stand with RW, then min A short ambulatory transfer with RW > w/c with cues needed for TDWB adherence on RLE. Transported dependently in w/c <> outside courtyard for change of scenery.  Seated in w/c, pt completed the following BUE exercises to promote BUE strength/endurance needed for independence with functional transfers and BADLs: (With yellow theraband) 10 reps Self-anchored shoulder flexion (RUE only) Self-anchored bicep flexion each arm Self-anchored tricep extension each arm Therapist anchored scapular retraction each arm Alternating chest presses (RUE only) -modifications needed for LUE due to arthritic changes/pain  On level pavement, pt stood with CGA using RW, then ambulated 10 ft with min A using RW prior to fatigue, with good RLE TDWB adherence as compared to with stand pivot. Back in room, pt request to use bathroom. Min A sit > stand  with grab bar and min A stand pivot > toilet. Mod A needed for LB clothing management, but pt able to manage wiping with set up A while seated. Mod A needed to stand from standard toilet with grab bar and Rt HHA then min A stand pivot > w/c, then min A sit > stand and stand pivot with RW > EOB. Doffed shoes total A, then mod A to transition supine. Nurse notified of pain med request due to emerging discomfort in RLE and nursing in room to administer. Pt remained semi upright in bed, with bed alarm on/activated, and with all needs in reach at end of session.   Therapy Documentation Precautions:  Precautions Precautions: Fall Precaution Comments: active cancer/chemo Restrictions Weight Bearing Restrictions: Yes RLE Weight Bearing: Touchdown weight bearing    Therapy/Group: Individual Therapy  Melvyn Novas, MS, OTR/L  07/10/2023, 3:36 PM

## 2023-07-10 NOTE — Telephone Encounter (Addendum)
Prescription refill request for Eliquis received. Indication: Afib  Last office visit: 06/16/23 [facility])  Scr: 0.70 (07/10/23)  Age: 77 Weight: 60.5kg  Per dosing criteria, pt should be taking 5mg  BID.  Per Dr Hulen Shouts note on 06/16/23  - "Although she does not completely fulfill criteria for reduced dose anticoagulant she is close to 60 kg and weight and she is 77 years old"   Refill sent per pt's request - 5mg  tablet  with instructions to "take 1/2 tablet by mouth twice daily".

## 2023-07-10 NOTE — Progress Notes (Signed)
Physical Therapy Session Note  Patient Details  Name: Karen Allison MRN: [id number] Date of Birth: February 05, 1946  Today's Date: 07/10/2023 PT Individual Time: 3329-5188 and 4166-0630 PT Individual Time Calculation (min): 68 min and 41 min  Short Term Goals: Week 1:  PT Short Term Goal 1 (Week 1): pt will transfer sit<>stand with LRAD and CGA PT Short Term Goal 2 (Week 1): pt will ambulate 11ft with LRAD and CGA PT Short Term Goal 3 (Week 1): pt will perform bed mobility with CGA  Skilled Therapeutic Interventions/Progress Updates:   Treatment Session 1 Received pt sitting in recliner, pt agreeable to PT treatment, and reported pain 5/10 in R hip (premedicated) - pt reported "overdoing" it in therapy on Saturday. Session with emphasis on functional mobility/transfers, dressing, global strengthening and endurance, and gait training. Donned pants sitting in recliner with supervision (using reacher) and required max A to don socks and shoes for time management purposes. Doffed gown and donned pull over shirt with supervision and jacket with min A. Pt stood from recliner with RW and CGA (cues for hand placement) and required mod A to pull pants over hips on R side. Transferred into WC with RW and min A with cues for technique to maintain TDWB precautions. Pt sat in WC at sink and brushed teeth/groomed with set up assist.   Pt performed WC mobility 61ft using BUE and supervision - limited by discomfort in LUE and transported remainder of way to dayroom in WC dependently. MD arrived for morning rounds, then pt stood with RW and CGA x 2 trials and ambulated 41ft x 1 and 38ft x 1 with RW and CGA/min A with cues to shift weight towards R toe rather than R heel for improved adherence to RLE TDWB precautions, although pt still appears to be placing too much weight through RLE. Attempted to locate youth RW to give pt more leverage but unable to find. Pt then performed seated knee extension 2x20 and seated  hip flexion 2x10 bilaterally with emphasis on LE strength/ROM. Returned to room, stood from [facility] with RW and min A, and transferred back into recliner via stand<>pivot with CGA. Concluded session with pt sitting in recliner, needs within reach, and chair pad alarm on.    Treatment Session 2 Received pt semi-reclined in bed, pt agreeable to PT treatment, and reported pain 4/10 in R knee (premedicated). Session with emphasis on functional mobility/transfers, toileting, global strengthening and endurance, and dynamic standing balance/coordination. Pt transferred semi-reclined<>sitting R EOB with HOB elevated and use of bedrails with light min A for RLE management. Pt reported urge to toilet and transferred bed<>WC<>toilet with RW and CGA with cues to adhere to RLE TDWB precautions. Pt required mod A to stand from regular height toilet and for clothing management and able to void - pt distracted chatting but easily redirected. Pt able to perform hygiene management without assist and sat in WC at sink to wash hands.   Stood from [facility] with RW and CGA and ambulated 62ft with RW and CGA to recliner - pt with continued difficulty maintaining RLE TDWB precautions but denied any increase in pain. Stood from [employer] with RW and CGA x 2 trials and performed the following exercises: -R hip flexion 3x5 -R hip abduction 2x10  Concluded session with pt sitting in recliner, needs within reach, and chair pad alarm on awaiting upcoming OT session.   Therapy Documentation Precautions:  Precautions Precautions: Fall Precaution Comments: active cancer/chemo Restrictions Weight Bearing Restrictions: No RLE Weight  Bearing: Touchdown weight bearing  Therapy/Group: Individual Therapy Marlana Salvage Zaunegger Blima Rich PT, DPT 07/10/2023, 6:56 AM

## 2023-07-10 NOTE — Progress Notes (Signed)
PROGRESS NOTE   Subjective/Complaints: Moved bowels all night yesterday after receiving sorbitol Feels pain is worst at night Has taken valium for many years at night and it is not helping her sleep  ROS: Patient denies fever, rash, sore throat, blurred vision, dizziness, nausea, vomiting, diarrhea, cough, shortness of breath or chest pain,   headache, or mood change. +frequent BM  Objective:   No results found. No results for input(s): "WBC", "HGB", "HCT", "PLT" in the last 72 hours.  Recent Labs    07/10/23 0618  NA 132*  K 3.5  CL 100  CO2 25  GLUCOSE 161*  BUN 9  CREATININE 0.70  CALCIUM 8.1*    Intake/Output Summary (Last 24 hours) at 07/10/2023 1058 Last data filed at 07/10/2023 0900 Gross per 24 hour  Intake 720 ml  Output --  Net 720 ml        Physical Exam: Vital Signs Blood pressure (!) 117/51, pulse 81, temperature 98.1 F (36.7 C), resp. rate 16, height 5\' 1"  (1.549 m), weight 60.5 kg, SpO2 97%. Constitutional: No distress . Vital signs reviewed. HEENT: NCAT, EOMI, oral membranes moist Neck: supple Cardiovascular: RRR without murmur. No JVD    Respiratory/Chest: CTA Bilaterally without wheezes or rales. Normal effort    GI/Abdomen: BS +, non-tender, non-distended Ext: no clubbing, cyanosis, legs with 1+ edema Psych: pleasant and cooperative  Musculoskeletal:     Cervical back: Neck supple. No tenderness.     Comments: Ue's 5-/5 B/L RLE- 2-/5 HF; KE 3+/5- both limited due to pain DF/PF 5-/5 LLE- HF 4/5; KE 4+/5; DF/PF 5-/5- said has always been weaker since back surgery --exam stable 11/18 -Pain in right proximal thigh/inguinal area with HF Skin:    General: Skin is warm and dry.     Comments: surgical wound stable. Neuro: Alert and oriented x 3. Normal insight and awareness. Intact Memory. Normal language and speech. Cranial nerve exam unremarkable. Patchy sensory loss in legs.  .      Assessment/Plan: 1. Functional deficits which require 3+ hours per day of interdisciplinary therapy in a comprehensive inpatient rehab setting. Physiatrist is providing close team supervision and 24 hour management of active medical problems listed below. Physiatrist and rehab team continue to assess barriers to discharge/monitor patient progress toward functional and medical goals  Care Tool:  Bathing    Body parts bathed by patient: Right arm, Left arm, Chest, Abdomen, Right upper leg, Left upper leg, Face   Body parts bathed by helper: Right lower leg, Left lower leg, Front perineal area, Buttocks     Bathing assist Assist Level: Moderate Assistance - Patient 50 - 74%     Upper Body Dressing/Undressing Upper body dressing   What is the patient wearing?: Pull over shirt    Upper body assist Assist Level: Minimal Assistance - Patient > 75%    Lower Body Dressing/Undressing Lower body dressing      What is the patient wearing?: Pants, Incontinence brief     Lower body assist Assist for lower body dressing: Total Assistance - Patient < 25%     Toileting Toileting    Toileting assist Assist for toileting: Maximal Assistance - Patient  25 - 49%     Transfers Chair/bed transfer  Transfers assist     Chair/bed transfer assist level: Minimal Assistance - Patient > 75%     Locomotion Ambulation   Ambulation assist      Assist level: Minimal Assistance - Patient > 75% Assistive device: Walker-rolling Max distance: 39ft   Walk 10 feet activity   Assist     Assist level: Minimal Assistance - Patient > 75% Assistive device: Walker-rolling   Walk 50 feet activity   Assist Walk 50 feet with 2 turns activity did not occur: Safety/medical concerns (fatigue, pain, decreased adherance to RLE TDWB precautions)         Walk 150 feet activity   Assist Walk 150 feet activity did not occur: Safety/medical concerns (fatigue, pain, decreased adherance to  RLE TDWB precautions)         Walk 10 feet on uneven surface  activity   Assist Walk 10 feet on uneven surfaces activity did not occur: Safety/medical concerns (fatigue, pain, decreased adherance to RLE TDWB precautions)         Wheelchair     Assist Is the patient using a wheelchair?: Yes Type of Wheelchair: Manual    Wheelchair assist level: Supervision/Verbal cueing Max wheelchair distance: 110ft    Wheelchair 50 feet with 2 turns activity    Assist    Wheelchair 50 feet with 2 turns activity did not occur:  (fatigue)   Assist Level: Total Assistance - Patient < 25%   Wheelchair 150 feet activity     Assist  Wheelchair 150 feet activity did not occur:  (fatigue)   Assist Level: Total Assistance - Patient < 25%   Blood pressure (!) 117/51, pulse 81, temperature 98.1 F (36.7 C), resp. rate 16, height 5\' 1"  (1.549 m), weight 60.5 kg, SpO2 97%.   Medical Problem List and Plan: 1. Functional deficits secondary to right comminuted acetabular/right inferior pubic ramus fracture fracture.  Status post ORIF right acetabular fracture 07/03/2023.  Touchdown weightbearing.  No posterior hip precautions needed             -patient may  shower- cover incision             -ELOS/Goals: 10-12 days mod I to supervision  Grounds pass ordered  -Continue CIR therapies including PT, OT   2.  Antithrombotics: -DVT/anticoagulation:  Pharmaceutical: Eliquis             -antiplatelet therapy: N/A 3. Pain Management: Neurontin 600 mg 3 times daily, Robaxin 500 mg 4 times daily, hydrocodone as needed- will increase Robaxin to 750 mg q6H  -11/17 order kpad today. Likely mild hip flexor strain 4. Mood/Behavior/Sleep: Valium 10 mg nightly as prior to admission.  Provide emotional support             -antipsychotic agents: N/A 5. Neuropsych/cognition: This patient is capable of making decisions on her own behalf. 6. Skin/Wound Care: Routine skin checks 7.  Fluids/Electrolytes/Nutrition: Routine in and outs with follow-up chemistries 8.  Atrial fibrillation/flutter with RVR.  Followed by Dr. Dulce Sellar.  Toprol XL 50 mg nightly.Refused Cardizem 9.  Hypothyroidism.  Synthroid 10.  History of rosacea.  Patient maintained on chronic doxycycline 11.  Hyperlipidemia.  Crestor 12.  Stage IV breast cancer with mets to liver and lung.  Patient receives Trastuzumab infusion every 3 weeks followed by Dr. Gery Pray of oncology services.    -?timing of further infusions 13.  Chronic left shoulder pain.  Patient did receive  steroid injection 07/05/2023 per orthopedic services  -pain mgt as above 14.  History of urinary retention.  Maintained on Flomax as prior to admission.   -pt voiding continently without issue 15. Chronic constipation.  Senokot-S 2 tablets twice daily, Colace 100 mg twice daily, Dulcolax suppository as needed- magnesium gluconate 250mg  ordered HS  11/18: moved bowels all night, decrease senna docusate to 2 tabs HS  16. Hyponatremia: encouraged salting foods  17. Suboptimal potassium: kdur ordered 11/15, ordered 11/18.  18. Lower ext edema  -11/17 give dose of lasix today x 1  19. Insomnia: increase gabapentin to 1200mg  HS as she takes at home  20. GERD: increase pantoprazole to 40mg  BID as she takes omeprazole at home   LOS: 4 days A FACE TO FACE EVALUATION WAS PERFORMED  Drema Pry Vonita Calloway 07/10/2023, 10:58 AM

## 2023-07-10 NOTE — Progress Notes (Signed)
Occupational Therapy Session Note  Patient Details  Name: Starlett Hockert MRN: [id number] Date of Birth: 12/08/1945  {CHL IP REHAB OT TIME CALCULATIONS:[id number]}   Short Term Goals: Week 1:  OT Short Term Goal 1 (Week 1): Pt will complete sit > stand in prep for ADL with CGA using LRAD OT Short Term Goal 2 (Week 1): Pt will dress LB with min A using AE PRN OT Short Term Goal 3 (Week 1): Pt will complete toilet transfer with CGA using LRAD OT Short Term Goal 4 (Week 1): Pt will improve ROM in LUE in a pain free zone to promote UB dressing with set up A  Skilled Therapeutic Interventions/Progress Updates:    Patient agreeable to participate in OT session. Reports *** pain level.   Patient participated in skilled OT session focusing on ***. Therapist facilitated/assessed/developed/educated/integrated/elicited *** in order to improve/facilitate/promote    Therapy Documentation Precautions:  Precautions Precautions: Fall Precaution Comments: active cancer/chemo Restrictions Weight Bearing Restrictions: No RLE Weight Bearing: Touchdown weight bearing  Therapy/Group: Individual Therapy  Limmie Patricia, OTR/L,CBIS  Supplemental OT - [facility] and [facility] Secure Chat Preferred   07/10/2023, 10:10 PM

## 2023-07-11 ENCOUNTER — Inpatient Hospital Stay (HOSPITAL_COMMUNITY): Payer: Medicare Other

## 2023-07-11 ENCOUNTER — Other Ambulatory Visit: Payer: Self-pay

## 2023-07-11 ENCOUNTER — Other Ambulatory Visit: Payer: Self-pay | Admitting: Pharmacist

## 2023-07-11 ENCOUNTER — Telehealth: Payer: Self-pay

## 2023-07-11 ENCOUNTER — Encounter: Payer: Self-pay | Admitting: Oncology

## 2023-07-11 DIAGNOSIS — M79661 Pain in right lower leg: Secondary | ICD-10-CM | POA: Diagnosis not present

## 2023-07-11 DIAGNOSIS — S32401D Unspecified fracture of right acetabulum, subsequent encounter for fracture with routine healing: Secondary | ICD-10-CM | POA: Diagnosis not present

## 2023-07-11 DIAGNOSIS — C50912 Malignant neoplasm of unspecified site of left female breast: Secondary | ICD-10-CM

## 2023-07-11 LAB — BASIC METABOLIC PANEL
Anion gap: 11 (ref 5–15)
BUN: 10 mg/dL (ref 8–23)
CO2: 24 mmol/L (ref 22–32)
Calcium: 8.6 mg/dL — ABNORMAL LOW (ref 8.9–10.3)
Chloride: 97 mmol/L — ABNORMAL LOW (ref 98–111)
Creatinine, Ser: 0.62 mg/dL (ref 0.44–1.00)
GFR, Estimated: >60 mL/min (ref >60)
Glucose, Bld: 111 mg/dL — ABNORMAL HIGH (ref 70–99)
Potassium: 4.6 mmol/L (ref 3.5–5.1)
Sodium: 132 mmol/L — ABNORMAL LOW (ref 135–145)

## 2023-07-11 MED ORDER — APIXABAN 5 MG PO TABS: 5.0000 mg | ORAL_TABLET | Freq: Two times a day (BID) | ORAL | Status: DC

## 2023-07-11 MED ORDER — HYDROCODONE-ACETAMINOPHEN 5-325 MG PO TABS
1.0000 | ORAL_TABLET | ORAL | Status: DC
  Administered 2023-07-11 – 2023-07-13 (×10): 2 via ORAL
  Administered 2023-07-13 (×2): 1 via ORAL
  Administered 2023-07-14 (×4): 2 via ORAL
  Administered 2023-07-15 (×5): 1 via ORAL
  Administered 2023-07-15 – 2023-07-16 (×3): 2 via ORAL
  Administered 2023-07-16 (×3): 1 via ORAL
  Administered 2023-07-17: 2 via ORAL
  Administered 2023-07-17 – 2023-07-18 (×6): 1 via ORAL
  Administered 2023-07-18: 2 via ORAL
  Administered 2023-07-18 – 2023-07-19 (×3): 1 via ORAL
  Administered 2023-07-19: 2 via ORAL
  Filled 2023-07-11: qty 1
  Filled 2023-07-11: qty 2
  Filled 2023-07-11: qty 1
  Filled 2023-07-11 (×2): qty 2
  Filled 2023-07-11: qty 1
  Filled 2023-07-11: qty 2
  Filled 2023-07-11 (×2): qty 1
  Filled 2023-07-11: qty 2
  Filled 2023-07-11: qty 1
  Filled 2023-07-11 (×3): qty 2
  Filled 2023-07-11: qty 1
  Filled 2023-07-11 (×2): qty 2
  Filled 2023-07-11: qty 1
  Filled 2023-07-11 (×6): qty 2
  Filled 2023-07-11 (×4): qty 1
  Filled 2023-07-11 (×3): qty 2
  Filled 2023-07-11: qty 1
  Filled 2023-07-11 (×3): qty 2
  Filled 2023-07-11: qty 1
  Filled 2023-07-11 (×2): qty 2

## 2023-07-11 MED ORDER — APIXABAN 2.5 MG PO TABS
2.5000 mg | ORAL_TABLET | Freq: Two times a day (BID) | ORAL | Status: DC
  Administered 2023-07-11 – 2023-07-19 (×17): 2.5 mg via ORAL
  Filled 2023-07-11 (×17): qty 1

## 2023-07-11 MED ORDER — DOCUSATE SODIUM 100 MG PO CAPS
100.0000 mg | ORAL_CAPSULE | Freq: Every day | ORAL | Status: DC
  Administered 2023-07-12 – 2023-07-15 (×4): 100 mg via ORAL
  Filled 2023-07-11 (×4): qty 1

## 2023-07-11 NOTE — Progress Notes (Signed)
Physical Therapy Session Note  Patient Details  Name: Karen Allison MRN: [id number] Date of Birth: 1946/03/09  Today's Date: 07/11/2023 PT Individual Time: 1331-1426 PT Individual Time Calculation (min): 55 min   Short Term Goals: Week 1:  PT Short Term Goal 1 (Week 1): pt will transfer sit<>stand with LRAD and CGA PT Short Term Goal 2 (Week 1): pt will ambulate 18ft with LRAD and CGA PT Short Term Goal 3 (Week 1): pt will perform bed mobility with CGA  Skilled Therapeutic Interventions/Progress Updates:   Received pt sitting in WC, pt agreeable to PT treatment, and reported pain 4/10 in R hip (premedicated). Session with emphasis on functional mobility/transfers, global strengthening and endurance, dynamic standing balance/coordination, and gait training. Discussed equipment for D/C - pt reports already having WC fit for her, RW, gait belt, slideboard, shower bench, sock aide, and bedside commode. Pt performed WC mobility 1103ft using BUE initially, then changing using LLE and RUE and supervision with emphasis on UE/LE strength/ROM. Stood from [facility] with RW and CGA and ambulated 52ft with RW and light min A - pt with poor adherence to RLE TDWB precautions; removed R shoe and pt demo improvements in adherence to precautions.   Stood from [facility] with RW and CGA x 3 trials and performed 3x8 RLE step overs hockey stick with 1lb ankle weight on RLE and CGA with emphasis on R hip flexor strength. Transitioned to sit<>stands on Airex with RW x5 reps with min A to fatigue - emphasis on adherence to RLE TDWB precautions and L quad strength. Transferred into WC with RW and CGA and transported back to room dependently. Pt requested to return to bed and transferred WC<>bed stand<>pivot with RW and min A to stand (due to fatigue). Removed shoes without assist and transferred into supine with supervision. Concluded session with pt semi-reclined in bed, needs within reach, and bed alarm on.   Therapy  Documentation Precautions:  Precautions Precautions: Fall Precaution Comments: active cancer/chemo Restrictions Weight Bearing Restrictions: No RLE Weight Bearing: Touchdown weight bearing  Therapy/Group: Individual Therapy Marlana Salvage Zaunegger Blima Rich PT, DPT 07/11/2023, 6:51 AM

## 2023-07-11 NOTE — Telephone Encounter (Signed)
Pt still inpt at [facility] for recent hip fracture, that was non operable.She has appt here 07/17/2023, that will most likely be delayed.  Darl Pikes [facility], has moved appt's I think.

## 2023-07-11 NOTE — Progress Notes (Signed)
Occupational Therapy Session Note  Patient Details  Name: Karen Allison MRN: [id number] Date of Birth: 1946-06-15  Today's Date: 07/11/2023 OT Individual Time: 5366-4403 OT Individual Time Calculation (min): 55 min    Short Term Goals: Week 1:  OT Short Term Goal 1 (Week 1): Pt will complete sit > stand in prep for ADL with CGA using LRAD OT Short Term Goal 2 (Week 1): Pt will dress LB with min A using AE PRN OT Short Term Goal 3 (Week 1): Pt will complete toilet transfer with CGA using LRAD OT Short Term Goal 4 (Week 1): Pt will improve ROM in LUE in a pain free zone to promote UB dressing with set up A  Skilled Therapeutic Interventions/Progress Updates:  Skilled OT intervention completed with focus on ADL retraining, AE education, functional ambulation. Pt received in standing with with NT present finishing toilet transfer back to EOB, agreeable to session. Rt knee pain reported; nurse notified of pain med request. OT offered rest breaks and repositioning throughout for pain reduction.  Pt politely declined bathing. Donned deodorant, shirt with set up A. Utilized Geographical information systems officer to thread LB clothing however min A needed for technique. Education provided on use of sock aid, with pt able to thread both socks with supervision with verbal cues for technique. Min A needed to donn Rt shoe, but supervision for slip on Lt shoe. Min A sit > stand and stand pivot > w/c with RW with cues for TDWB adherence on RLE as without cues, pt has difficulty maintaining.  Nurse present for meds; MD notified of pt inquiry about discontinued order for blood thinner. Transported dependently in w/c > hallway. Min A sit > stand with RW, then pt ambulated about 10 ft with min A using RW with cues needed for off loading RLE during gait pattern by using LLE and BUE. Pt reported pain in Rt calf region, with increase in pain with palpation over specific calf site. MD notified of symptom and advised pt that [place] may  be warranted and once cleared, to donn TEDs for edema management. Transported dependently back to room, then min A sit > stand and stand pivot > recliner to elevate BLE. Pt remained seated in recliner, with chair alarm on/activated, and with all needs in reach at end of session.   Therapy Documentation Precautions:  Precautions Precautions: Fall Precaution Comments: active cancer/chemo Restrictions Weight Bearing Restrictions: No RLE Weight Bearing: Touchdown weight bearing    Therapy/Group: Individual Therapy  Melvyn Novas, MS, OTR/L  07/11/2023, 12:24 PM

## 2023-07-11 NOTE — Progress Notes (Addendum)
Patient ID: Karen Allison, female   DOB: 04-29-1946, 77 y.o.   MRN: [id number]   SW reached out to [facility] at [phone] . Sw left 4x VM with patient scheduling and provider referral line to discuss and determine if patient has an scheduled treatment Friday 11/22 or if treatment has already been rescheduled. SW will wait for follow up    11:33 AM SW left VM with daughter to confirm any information on appointment. Sw will wait for follow up.  12:54 PM: Daughter has confirmed patient has an appointment Friday at 11:00 AM. Sw will continue to reach out to oncology.   12:57 PM: Sw received follow up from Oncology. Per oncology office all of pt's appointments have been delayed until she discharges, daughter informed by Oncology office on 11/15. SW will updates Oncology office tomorrow of expected d/c date. Sw will informed daughter to call to arrange appointment at d/c.

## 2023-07-11 NOTE — Progress Notes (Signed)
PROGRESS NOTE   Subjective/Complaints: Asks why she didn't receive Eliquis last night or today, appears to have fallen off, having right lower extremity cramping, eliquis restarted and doppler ordered  ROS: Patient denies fever, rash, sore throat, blurred vision, dizziness, nausea, vomiting, diarrhea, cough, shortness of breath or chest pain,   headache, or mood change. +frequent BM, +right lower extremity cramping  Objective:   No results found. No results for input(s): "WBC", "HGB", "HCT", "PLT" in the last 72 hours.  Recent Labs    07/10/23 0618  NA 132*  K 3.5  CL 100  CO2 25  GLUCOSE 161*  BUN 9  CREATININE 0.70  CALCIUM 8.1*    Intake/Output Summary (Last 24 hours) at 07/11/2023 1142 Last data filed at 07/11/2023 0800 Gross per 24 hour  Intake 598 ml  Output --  Net 598 ml        Physical Exam: Vital Signs Blood pressure 131/65, pulse 78, temperature 98.1 F (36.7 C), resp. rate 18, height 5\' 1"  (1.549 m), weight 60.5 kg, SpO2 96%. Constitutional: No distress . Vital signs reviewed. HEENT: NCAT, EOMI, oral membranes moist Neck: supple Cardiovascular: RRR without murmur. No JVD    Respiratory/Chest: CTA Bilaterally without wheezes or rales. Normal effort    GI/Abdomen: BS +, non-tender, non-distended Ext: no clubbing, cyanosis, legs with 1+ edema Psych: pleasant and cooperative  Musculoskeletal:     Cervical back: Neck supple. No tenderness.     Comments: Ue's 5-/5 B/L RLE-3/5 HF; KE 3+/5- both limited due to pain DF/PF 5-/5 LLE- HF 4/5; KE 4+/5; DF/PF 5-/5- said has always been weaker since back surgery -exam improved 11/19 -Pain in right proximal thigh/inguinal area with HF Skin:    General: Skin is warm and dry.     Comments: surgical wound stable. Neuro: Alert and oriented x 3. Normal insight and awareness. Intact Memory. Normal language and speech. Cranial nerve exam unremarkable. Patchy  sensory loss in legs.  .     Assessment/Plan: 1. Functional deficits which require 3+ hours per day of interdisciplinary therapy in a comprehensive inpatient rehab setting. Physiatrist is providing close team supervision and 24 hour management of active medical problems listed below. Physiatrist and rehab team continue to assess barriers to discharge/monitor patient progress toward functional and medical goals  Care Tool:  Bathing    Body parts bathed by patient: Right arm, Left arm, Chest, Abdomen, Right upper leg, Left upper leg, Face   Body parts bathed by helper: Right lower leg, Left lower leg, Front perineal area, Buttocks     Bathing assist Assist Level: Moderate Assistance - Patient 50 - 74%     Upper Body Dressing/Undressing Upper body dressing   What is the patient wearing?: Pull over shirt    Upper body assist Assist Level: Minimal Assistance - Patient > 75%    Lower Body Dressing/Undressing Lower body dressing      What is the patient wearing?: Pants, Incontinence brief     Lower body assist Assist for lower body dressing: Total Assistance - Patient < 25%     Toileting Toileting    Toileting assist Assist for toileting: Maximal Assistance - Patient 25 -  49%     Transfers Chair/bed transfer  Transfers assist     Chair/bed transfer assist level: Minimal Assistance - Patient > 75%     Locomotion Ambulation   Ambulation assist      Assist level: Minimal Assistance - Patient > 75% Assistive device: Walker-rolling Max distance: 34ft   Walk 10 feet activity   Assist     Assist level: Minimal Assistance - Patient > 75% Assistive device: Walker-rolling   Walk 50 feet activity   Assist Walk 50 feet with 2 turns activity did not occur: Safety/medical concerns (fatigue, pain, decreased adherance to RLE TDWB precautions)         Walk 150 feet activity   Assist Walk 150 feet activity did not occur: Safety/medical concerns (fatigue,  pain, decreased adherance to RLE TDWB precautions)         Walk 10 feet on uneven surface  activity   Assist Walk 10 feet on uneven surfaces activity did not occur: Safety/medical concerns (fatigue, pain, decreased adherance to RLE TDWB precautions)         Wheelchair     Assist Is the patient using a wheelchair?: Yes Type of Wheelchair: Manual    Wheelchair assist level: Supervision/Verbal cueing Max wheelchair distance: 52ft    Wheelchair 50 feet with 2 turns activity    Assist    Wheelchair 50 feet with 2 turns activity did not occur:  (fatigue)   Assist Level: Total Assistance - Patient < 25%   Wheelchair 150 feet activity     Assist  Wheelchair 150 feet activity did not occur:  (fatigue)   Assist Level: Total Assistance - Patient < 25%   Blood pressure 131/65, pulse 78, temperature 98.1 F (36.7 C), resp. rate 18, height 5\' 1"  (1.549 m), weight 60.5 kg, SpO2 96%.   Medical Problem List and Plan: 1. Functional deficits secondary to right comminuted acetabular/right inferior pubic ramus fracture fracture.  Status post ORIF right acetabular fracture 07/03/2023.  Touchdown weightbearing.  No posterior hip precautions needed             -patient may  shower- cover incision             -ELOS/Goals: 10-12 days mod I to supervision  Grounds pass ordered  -Continue CIR therapies including PT, OT   2.  Antithrombotics: -DVT/anticoagulation:  Pharmaceutical: Eliquis             -antiplatelet therapy: N/A 3. Pain Management: Neurontin 600 mg 3 times daily, Robaxin 500 mg 4 times daily, hydrocodone as needed- will increase Robaxin to 750 mg q6H  -11/17 order kpad today. Likely mild hip flexor strain 4. Mood/Behavior/Sleep: Valium 10 mg nightly as prior to admission.  Provide emotional support             -antipsychotic agents: N/A 5. Neuropsych/cognition: This patient is capable of making decisions on her own behalf. 6. Skin/Wound Care: Routine skin  checks 7. Fluids/Electrolytes/Nutrition: Routine in and outs with follow-up chemistries 8.  Atrial fibrillation/flutter with RVR.  Followed by Dr. Dulce Sellar.  Toprol XL 50 mg nightly.Refused Cardizem 9.  Hypothyroidism.  Synthroid 10.  History of rosacea.  Patient maintained on chronic doxycycline 11.  Hyperlipidemia.  Crestor 12.  Stage IV breast cancer with mets to liver and lung.  Patient receives Trastuzumab infusion every 3 weeks followed by Dr. Gery Pray of oncology services.  Discussed that infusion will have to be postponed to post-rehab 13.  Chronic left shoulder pain.  Patient did  receive steroid injection 07/05/2023 per orthopedic services  -pain mgt as above 14.  History of urinary retention.  Maintained on Flomax as prior to admission.   -pt voiding continently without issue 15. Chronic constipation.  Senokot-S 2 tablets twice daily, Colace 100 mg twice daily, Dulcolax suppository as needed- magnesium gluconate 250mg  ordered HS  11/18: moved bowels all night, decrease senna docusate to 2 tabs HS  11/19: type 5 stool this morning, decrease colace to daily  16. Hyponatremia: encouraged salting foods  17. Suboptimal potassium: kdur ordered 11/15, ordered 11/18, check potassium today  18. Lower ext edema  -11/17 give dose of lasix today x 1  19. Insomnia: increase gabapentin to 1200mg  HS as she takes at home  20. GERD: increase pantoprazole to 40mg  BID as she takes omeprazole at home  21. RLE cramping: VAS [place] ordered   LOS: 5 days A FACE TO FACE EVALUATION WAS PERFORMED  Jamall Strohmeier P Gittel Mccamish 07/11/2023, 11:42 AM

## 2023-07-12 ENCOUNTER — Encounter: Payer: Self-pay | Admitting: Oncology

## 2023-07-12 DIAGNOSIS — S32401D Unspecified fracture of right acetabulum, subsequent encounter for fracture with routine healing: Secondary | ICD-10-CM | POA: Diagnosis not present

## 2023-07-12 MED ORDER — CALCIUM CARBONATE ANTACID 500 MG PO CHEW
1.0000 | CHEWABLE_TABLET | Freq: Three times a day (TID) | ORAL | Status: DC
  Administered 2023-07-12 – 2023-07-14 (×6): 200 mg via ORAL
  Filled 2023-07-12 (×6): qty 1

## 2023-07-12 MED ORDER — TRAZODONE HCL 50 MG PO TABS
50.0000 mg | ORAL_TABLET | Freq: Every day | ORAL | Status: DC
  Administered 2023-07-12: 50 mg via ORAL
  Filled 2023-07-12: qty 1

## 2023-07-12 NOTE — Progress Notes (Signed)
Occupational Therapy Session Note  Patient Details  Name: Karen Allison MRN: [id number] Date of Birth: 10/20/1945  Today's Date: 07/12/2023 OT Individual Time: 1035-1130 & 4098-1191 OT Individual Time Calculation (min): 55 min & 75 min   Short Term Goals: Week 1:  OT Short Term Goal 1 (Week 1): Pt will complete sit > stand in prep for ADL with CGA using LRAD OT Short Term Goal 2 (Week 1): Pt will dress LB with min A using AE PRN OT Short Term Goal 3 (Week 1): Pt will complete toilet transfer with CGA using LRAD OT Short Term Goal 4 (Week 1): Pt will improve ROM in LUE in a pain free zone to promote UB dressing with set up A  Skilled Therapeutic Interventions/Progress Updates:  Session 1 Skilled OT intervention completed with focus on toileting, functional transfers, dynamic balance, TDWB adherence. Pt received seated in recliner, agreeable to session. No pain reported.  Pt requested to use restroom. Completed all sit > stands and stand pivot transfers with CGA/min A using RW with varying adherence to TDWB on RLE with frequent verbal cues needed.   Stand pivot > w/c, then stand pivot with grab bar in bathroom > toilet with max A to lower clothing and min A for dynamic balance. Pt continent of urinary void only and able to manage pericare independently. Stood using grab bar then max A to donn clothing over hips and stand pivot back to w/c.  Set up A for hand hygiene and oral care seated at sink. Pt utilized BLE only for promoting BLE AROM/reducing stiffness and edema to self-propel 150 ft with supervision to gym. Stand pivot > EOM with RW.   Pt participated in the following dynamic standing balance and endurance tasks to promote independence and safety during BADLs and functional mobility: -cornhole toss activity; CGA sit > stand with RW, and min A needed for balance as pt initially tried to keep full weight off RLE vs TD therefore with narrow BOS, but with cues to use TDWB with RLE  on floor with wider BOS pt's balance improved to CGA. -alternating hand taps from RW <> either shoulder while maintaining TDWB on RLE; cues needed for upright trunk and gaze as with fatigue trunk flexion occurs  Stand pivot > w/c. Transported dependently in w/c > room for time. Pt remained seated in w/c, with chair alarm on/activated, and with all needs in reach at end of session.  Session 2 Skilled OT intervention completed with focus on ADL retraining, functional endurance, and mobility within a shower context. Pt received seated in recliner, agreeable to session. No pain reported.  Pt completed all sit > stands and short ambulatory transfers with CGA/min A while using RW. Min verbal cues needed for sequencing, RLE TDWB adherence and body positioning.  Stand pivot to w/c, then transported dependently in w/c > toilet. Stand pivot with RW > toilet, with min A to lower LB clothing. Pt continent of urinary void. Able to manage pericare independently. Doffed clothing with supervision. Short ambulatory transfer > TTB in shower.   Shower cap applied per pt preference. Pt was able to bathe all parts with supervision, at the seated level while using grab bar for balance. Utilized long handled sponge to access BLE and periareas as applicable. Cues needed for washing areas thoroughly. Stand pivot transfer using RW > w/c. Able to donn shirt/deo with set up A. Threaded LB clothing with supervision using reacher, then min A for standing balance to donn clothing over  hips. Donned socks/shoes with total A for time. Stand pivot to w/c.  In hallway, pt ambulated 85ft then 21 ft using RW with CGA/min A, with w/c brought for fatigue. Cues needed for TDWB adherence on RLE and to use BUE primarily to off load RLE during stepping phase. Utilized music to encourage pt as walking is not motivating for her. Transported dependently in w/c back to room. Assisted via stand pivot > BSC for incontinent and continent urinary void.  Min A overall for toileting steps then stand pivot to recliner. Pt remained seated in recliner with k-pad on Rt hip, with chair alarm on/activated, and with all needs in reach at end of session.   Therapy Documentation Precautions:  Precautions Precautions: Fall Precaution Comments: active cancer/chemo Restrictions Weight Bearing Restrictions: No RLE Weight Bearing: Touchdown weight bearing    Therapy/Group: Individual Therapy  Melvyn Novas, MS, OTR/L  07/12/2023, 3:44 PM

## 2023-07-12 NOTE — Progress Notes (Signed)
Patient ID: Karen Allison, female   DOB: 1946/02/15, 77 y.o.   MRN: [id number]  Team Conference Report to Patient/Family  Team Conference discussion was reviewed with the patient and caregiver, including goals, any changes in plan of care and target discharge date.  Patient and caregiver express understanding and are in agreement.  The patient has a target discharge date of 07/19/23.  SW met with patient to discuss team conference updates. Patient requesting for SW to reach out to her spouse instead of daughter. Patient requesting that her spouse serve as her primary contact and her granddaughter, Lequita Halt second.  SW made attempt to contact pt's spouse, unable to leave VM.   SW spoke with pt's granddaughter, Lequita Halt and informed her of team conference updates. Pt's granddaughter will pick her up for d/c and attend family education 11/25 10a-12p.  Sw has informed patient that her cancer treatments will resume after discharge. SW will reach out to Oncology office to inform office of current discharge.  Patient requesting HH and requesting to have services delayed a week after d/c due to her cancer treatment. No additional questions or concerns.  Andria Rhein 07/12/2023, 2:19 PM

## 2023-07-12 NOTE — Progress Notes (Signed)
Physical Therapy Session Note  Patient Details  Name: Karen Allison MRN: [id number] Date of Birth: 1946-08-20  Today's Date: 07/12/2023 PT Individual Time: 0910-1005 PT Individual Time Calculation (min): 55 min  Today's Date: 07/12/2023 PT Missed Time: 20 Minutes Missed Time Reason: Nursing care  Short Term Goals: Week 1:  PT Short Term Goal 1 (Week 1): pt will transfer sit<>stand with LRAD and CGA PT Short Term Goal 2 (Week 1): pt will ambulate 70ft with LRAD and CGA PT Short Term Goal 3 (Week 1): pt will perform bed mobility with CGA  Skilled Therapeutic Interventions/Progress Updates: Patient supine in bed on entrance to room. Patient alert and agreeable to PT session.   Patient reported unrated mild pain in R LE (premedicated that was 5/10 earlier in the morning but decreased). Pt required use of personal care that NT assisted with (pt missed time as indicated). Pt sitting in [facility] after personal care and propelled WC from room for about 30'  with supervision before reporting increase in pain in L shoulder due to previous injury. Pt dependently transported to main gym in [facility] for PTA to donn blue theraband around L hand grip wheel to see if it will further assist pt to propel WC further to meet LTG. Pt propelled WC from main gym down to central and back to main gym (pt made R and L turns along with having to navigate busy hallway and made 2, 360* turns with supervision throughout. Pt propelled total distance of 480' with one seated rest break at 270' (not pt request, PTA had to discuss something briefly with supervisor). Pt reported only needing to stop when back in main gym due to uncomfortability underneath L armpit (no reports of L shoulder pain as previously stated in session). PTA applied pillow behind pt slightly to the L in order to avoid L UE having discomfort on L back railing that attaches to handle. Pt propelled roughly 50' back to room to assess if it helped with pt's discomfort  (pt reported that it did and was thankful for the [facility] modifications). Pt dependently transported in [facility] back to room rest of the way for time management. Pt performed sit<stand pivot transfers from [facility] to recliner at end of session with RW and with CGA. Provided VC for Pt to adhere to R LE TDWB precaution (pt with slight shuffle on L LE to adhere to it vs foot flat on floor).  Patient sitting in recliner at end of session with brakes locked, chair alarm set, and all needs within reach.      Therapy Documentation Precautions:  Precautions Precautions: Fall Precaution Comments: active cancer/chemo Restrictions Weight Bearing Restrictions: No RLE Weight Bearing: Touchdown weight bearing  Therapy/Group: Individual Therapy  Lanesha Azzaro PTA 07/12/2023, 12:29 PM

## 2023-07-12 NOTE — Progress Notes (Signed)
Patient ID: Karen Allison, female   DOB: 01/11/1946, 77 y.o.   MRN: [id number]   Oncology appointment scheduled at discharge for July 24, 2023 at 11:30 AM.   Office will be closed on 11/28 and 11/29.

## 2023-07-12 NOTE — Progress Notes (Signed)
PROGRESS NOTE   Subjective/Complaints: C/o insomnia, trazodone 50mg  scheduled HS Hydrododone scheduled for pain as she forgets to ask for it  ROS: Patient denies fever, rash, sore throat, blurred vision, dizziness, nausea, vomiting, diarrhea, cough, shortness of breath or chest pain,   headache, or mood change. +frequent BM, +right lower extremity cramping, +GERD  Objective:   VAS [place] LOWER EXTREMITY VENOUS (DVT)  Result Date: 07/11/2023  Lower Venous DVT Study Patient Name:  Karen Allison [facility]  Date of Exam:   07/11/2023 Medical Rec #: [id number]              Accession #:    [id number] Date of Birth: 04-07-1946               Patient Gender: F Patient Age:   77 years Exam Location:  [facility] Procedure:      VAS [place] LOWER EXTREMITY VENOUS (DVT) Referring Phys: Sula Soda --------------------------------------------------------------------------------  Indications: Pain.  Comparison Study: Previous study on 5.7.2020. Performing Technologist: Fernande Bras  Examination Guidelines: A complete evaluation includes B-mode imaging, spectral Doppler, color Doppler, and power Doppler as needed of all accessible portions of each vessel. Bilateral testing is considered an integral part of a complete examination. Limited examinations for reoccurring indications may be performed as noted. The reflux portion of the exam is performed with the patient in reverse Trendelenburg.  +---------+---------------+---------+-----------+----------+--------------+ RIGHT    CompressibilityPhasicitySpontaneityPropertiesThrombus Aging +---------+---------------+---------+-----------+----------+--------------+ CFV      Full           Yes      Yes                                 +---------+---------------+---------+-----------+----------+--------------+ SFJ      Full                                                         +---------+---------------+---------+-----------+----------+--------------+ FV Prox  Full                                                        +---------+---------------+---------+-----------+----------+--------------+ FV Mid   Full                                                        +---------+---------------+---------+-----------+----------+--------------+ FV DistalFull                                                        +---------+---------------+---------+-----------+----------+--------------+  PFV      Full                                                        +---------+---------------+---------+-----------+----------+--------------+ POP      Full           Yes      Yes                                 +---------+---------------+---------+-----------+----------+--------------+ PTV      Full                                                        +---------+---------------+---------+-----------+----------+--------------+ PERO     Full                                                        +---------+---------------+---------+-----------+----------+--------------+   +----+---------------+---------+-----------+----------+--------------+ LEFTCompressibilityPhasicitySpontaneityPropertiesThrombus Aging +----+---------------+---------+-----------+----------+--------------+ CFV Full           Yes      Yes                                 +----+---------------+---------+-----------+----------+--------------+     Summary: RIGHT: - There is no evidence of deep vein thrombosis in the lower extremity.  - No cystic structure found in the popliteal fossa.  LEFT: - No evidence of common femoral vein obstruction.   *See table(s) above for measurements and observations. Electronically signed by Coral Else MD on 07/11/2023 at 7:42:08 PM.    Final    No results for input(s): "WBC", "HGB", "HCT", "PLT" in the last 72 hours.  Recent Labs    07/10/23 0618  07/11/23 1248  NA 132* 132*  K 3.5 4.6  CL 100 97*  CO2 25 24  GLUCOSE 161* 111*  BUN 9 10  CREATININE 0.70 0.62  CALCIUM 8.1* 8.6*    Intake/Output Summary (Last 24 hours) at 07/12/2023 1133 Last data filed at 07/12/2023 0849 Gross per 24 hour  Intake 720 ml  Output --  Net 720 ml        Physical Exam: Vital Signs Blood pressure 131/64, pulse 74, temperature 97.7 F (36.5 C), resp. rate 18, height 5\' 1"  (1.549 m), weight 60.5 kg, SpO2 98%. Constitutional: No distress . Vital signs reviewed. HEENT: NCAT, EOMI, oral membranes moist Neck: supple Cardiovascular: RRR without murmur. No JVD    Respiratory/Chest: CTA Bilaterally without wheezes or rales. Normal effort    GI/Abdomen: BS +, non-tender, non-distended Ext: no clubbing, cyanosis, legs with 1+ edema Psych: pleasant and cooperative  Musculoskeletal:     Cervical back: Neck supple. No tenderness.     Comments: Ue's 5-/5 B/L RLE-3/5 HF; KE 3+/5- both limited due to pain DF/PF 5-/5 LLE- HF 4/5; KE 4+/5; DF/PF 5-/5- said has always been weaker since back surgery -exam stable 11/20 -Pain in right proximal thigh/inguinal area with  HF Skin:    General: Skin is warm and dry.     Comments: surgical wound stable. Neuro: Alert and oriented x 3. Normal insight and awareness. Intact Memory. Normal language and speech. Cranial nerve exam unremarkable. Patchy sensory loss in legs.  .     Assessment/Plan: 1. Functional deficits which require 3+ hours per day of interdisciplinary therapy in a comprehensive inpatient rehab setting. Physiatrist is providing close team supervision and 24 hour management of active medical problems listed below. Physiatrist and rehab team continue to assess barriers to discharge/monitor patient progress toward functional and medical goals  Care Tool:  Bathing    Body parts bathed by patient: Right arm, Left arm, Chest, Abdomen, Right upper leg, Left upper leg, Face   Body parts bathed by  helper: Right lower leg, Left lower leg, Front perineal area, Buttocks     Bathing assist Assist Level: Moderate Assistance - Patient 50 - 74%     Upper Body Dressing/Undressing Upper body dressing   What is the patient wearing?: Pull over shirt    Upper body assist Assist Level: Minimal Assistance - Patient > 75%    Lower Body Dressing/Undressing Lower body dressing      What is the patient wearing?: Pants, Incontinence brief     Lower body assist Assist for lower body dressing: Total Assistance - Patient < 25%     Toileting Toileting    Toileting assist Assist for toileting: Maximal Assistance - Patient 25 - 49%     Transfers Chair/bed transfer  Transfers assist     Chair/bed transfer assist level: Contact Guard/Touching assist     Locomotion Ambulation   Ambulation assist      Assist level: Minimal Assistance - Patient > 75% Assistive device: Walker-rolling Max distance: 69ft   Walk 10 feet activity   Assist     Assist level: Minimal Assistance - Patient > 75% Assistive device: Walker-rolling   Walk 50 feet activity   Assist Walk 50 feet with 2 turns activity did not occur: Safety/medical concerns (fatigue, pain, decreased adherance to RLE TDWB precautions)         Walk 150 feet activity   Assist Walk 150 feet activity did not occur: Safety/medical concerns (fatigue, pain, decreased adherance to RLE TDWB precautions)         Walk 10 feet on uneven surface  activity   Assist Walk 10 feet on uneven surfaces activity did not occur: Safety/medical concerns (fatigue, pain, decreased adherance to RLE TDWB precautions)         Wheelchair     Assist Is the patient using a wheelchair?: Yes Type of Wheelchair: Manual    Wheelchair assist level: Supervision/Verbal cueing Max wheelchair distance: 139ft    Wheelchair 50 feet with 2 turns activity    Assist    Wheelchair 50 feet with 2 turns activity did not occur:   (fatigue)   Assist Level: Supervision/Verbal cueing   Wheelchair 150 feet activity     Assist  Wheelchair 150 feet activity did not occur:  (fatigue)   Assist Level: Supervision/Verbal cueing   Blood pressure 131/64, pulse 74, temperature 97.7 F (36.5 C), resp. rate 18, height 5\' 1"  (1.549 m), weight 60.5 kg, SpO2 98%.   Medical Problem List and Plan: 1. Functional deficits secondary to right comminuted acetabular/right inferior pubic ramus fracture fracture.  Status post ORIF right acetabular fracture 07/03/2023.  Touchdown weightbearing.  No posterior hip precautions needed             -  patient may  shower- cover incision             -ELOS/Goals: 10-12 days mod I to supervision  Grounds pass ordered  -Continue CIR therapies including PT, OT   2.  Antithrombotics: -DVT/anticoagulation:  Pharmaceutical: Eliquis             -antiplatelet therapy: N/A 3. Pain Management: Neurontin 600 mg 3 times daily, Robaxin 500 mg 4 times daily, hydrocodone as needed- will increase Robaxin to 750 mg q6H  -11/17 order kpad today. Likely mild hip flexor strain 4. Mood/Behavior/Sleep: Valium 10 mg nightly as prior to admission.  Provide emotional support             -antipsychotic agents: N/A 5. Neuropsych/cognition: This patient is capable of making decisions on her own behalf. 6. Skin/Wound Care: Routine skin checks 7. Fluids/Electrolytes/Nutrition: Routine in and outs with follow-up chemistries 8.  Atrial fibrillation/flutter with RVR.  Followed by Dr. Dulce Sellar.  Toprol XL 50 mg nightly.Refused Cardizem 9.  Hypothyroidism.  Synthroid 10.  History of rosacea.  Patient maintained on chronic doxycycline 11.  Hyperlipidemia.  Crestor 12.  Stage IV breast cancer with mets to liver and lung.  Patient receives Trastuzumab infusion every 3 weeks followed by Dr. Gery Pray of oncology services.  Discussed that infusion will have to be postponed to post-rehab 13.  Chronic left shoulder pain.   Patient did receive steroid injection 07/05/2023 per orthopedic services  -pain mgt as above 14.  History of urinary retention.  Maintained on Flomax as prior to admission.   -pt voiding continently without issue 15. Chronic constipation.  Senokot-S 2 tablets twice daily, Colace 100 mg twice daily, Dulcolax suppository as needed- magnesium gluconate 250mg  ordered HS  11/18: moved bowels all night, decrease senna docusate to 2 tabs HS  11/19: type 5 stool this morning, decrease colace to daily  16. Hyponatremia: encouraged salting foods  17. Suboptimal potassium: kdur ordered 11/15, ordered 11/18, check potassium today  18. Lower ext edema  -11/17 give dose of lasix today x 1  19. Insomnia: increase gabapentin to 1200mg  HS as she takes at home, added trazodone 50mg  HS  20. GERD: increase pantoprazole to 40mg  BID as she takes omeprazole at home. Added tums with meals  21. RLE cramping: VAS [place] ordered and discussed with patient that it is negative  22. Hypocalcemia: added tums with meals   LOS: 6 days A FACE TO FACE EVALUATION WAS PERFORMED  Drema Pry Vermelle Cammarata 07/12/2023, 11:33 AM

## 2023-07-13 ENCOUNTER — Other Ambulatory Visit: Payer: Self-pay

## 2023-07-13 ENCOUNTER — Encounter: Payer: Self-pay | Admitting: Oncology

## 2023-07-13 DIAGNOSIS — S32401D Unspecified fracture of right acetabulum, subsequent encounter for fracture with routine healing: Secondary | ICD-10-CM | POA: Diagnosis not present

## 2023-07-13 MED ORDER — OMEPRAZOLE 20 MG PO CPDR
20.0000 mg | DELAYED_RELEASE_CAPSULE | Freq: Two times a day (BID) | ORAL | Status: DC
  Administered 2023-07-13 – 2023-07-19 (×12): 20 mg via ORAL
  Filled 2023-07-13 (×12): qty 1

## 2023-07-13 MED ORDER — TRAZODONE HCL 50 MG PO TABS
25.0000 mg | ORAL_TABLET | Freq: Every day | ORAL | Status: DC
  Administered 2023-07-13 – 2023-07-16 (×4): 25 mg via ORAL
  Filled 2023-07-13 (×4): qty 1

## 2023-07-13 NOTE — Evaluation (Signed)
Recreational Therapy Assessment and Plan  Patient Details  Name: Karen Allison MRN: [id number] Date of Birth: 07/18/1946 Today's Date: 07/13/2023  Rehab Potential:  Good ELOS:   d/c 11/27  Assessment Hospital Problem: Principal Problem:   Acetabular fracture (HCC)     Past Medical History:      Past Medical History:  Diagnosis Date   A-fib (HCC)     Acute respiratory failure with hypoxia (HCC) 05/09/2014   Adult hypothyroidism 05/03/2013   Arthritis     Blood dyscrasia      bleeds and bruises easily   Breast cancer (HCC)      left   Cervical spondylosis without myelopathy 05/06/2014    C3-4     Endometrial cancer (HCC) 01/21/1988   Fx lower humerus-closed 10/22/1997   Gait disorder 07/05/2016   Graves' disease 07/16/2021   HCAP (healthcare-associated pneumonia) 05/09/2014   Hepatitis       Hep A years ago   History of corticosteroid therapy 02/13/2023   History of endocrine disorder 02/13/2023   HNP (herniated nucleus pulposus), cervical 05/06/2014   Hypertension     Hyponatremia 12/02/2021   Hypothyroidism      Graves disease, age 52 yo   Malignant neoplasm of left breast (HCC) 06/05/2020   Nose trouble      right nostril will hemorrhage at times   Osteoporosis 11/04/2020   Other forms of scoliosis, thoracolumbar region 11/03/2016   Pneumonia     Rib pain on left side 09/26/2022   Rosacea     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    Overview:   Due to steroids and resolved with normal cortisol stimulation in 2011     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HCC)     Spinal stenosis in cervical region 05/06/2014    C3-4          Past Surgical History:       Past Surgical History:  Procedure Laterality Date   ABDOMINAL HYSTERECTOMY        Age 76   ANTERIOR CERVICAL DECOMP/DISCECTOMY FUSION N/A 05/06/2014    Procedure: ANTERIOR CERVICAL DISCECTOMY FUSION C3-4 with transgraft, local bone graft, plate and screws;  Surgeon: Kerrin Champagne, MD;   Location: [facility] OR;  Service: Orthopedics;  Laterality: N/A;   APPENDECTOMY       BREAST LUMPECTOMY Left     COLONOSCOPY       EYE SURGERY Bilateral      cataracts   LUMBAR SPINE SURGERY   2011    Removal of hematoma   OPEN REDUCTION INTERNAL FIXATION ACETABULAR FRACTURE STOPPA Right 07/03/2023    Procedure: OPEN REDUCTION INTERNAL FIXATION ACETABULAR FRACTURE STOPPA;  Surgeon: Roby Lofts, MD;  Location: [facility] OR;  Service: Orthopedics;  Laterality: Right;   ORIF HUMERUS FRACTURE Left 2013   THORACIC SPINE SURGERY   2011    Bone cages   THYROIDECTOMY       TONSILLECTOMY              Assessment & Plan Clinical Impression: Patient is a 77 y.o. year old female with history of atrial fibrillation maintained on chronic Eliquis followed by Dr. 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 Gery Pray of oncology services and receives trastuzumab every 3 weeks, hypothyroidism, ACDF with fusion 05/06/2014, quit smoking 61 years ago. Per chart review patient lives with spouse. Two-level home with bed and bath on main level  with ramped entrance. She used a rollator at baseline outside of the home and a straight point cane getting into the grocery store from car to cart. Independent with ADLs. Family with good support. Presented 06/29/2023 after mechanical fall in the kitchen while getting her coffee landing on her right side with no loss of consciousness. X-rays and imaging revealed comminuted fracture involving the acetabulum diffusely with displaced fragments and a component protrusion from the femoral head. Separate fracture of the right inferior pubic ramus. Some extraperitoneal hematoma along the right pelvic sidewall. Admission chemistries unremarkable except WBC 13,100, sodium 131. Hospital course patient A-fib with RVR rates in the 120s felt most likely driven by hip fracture. She did receive IV metoprolol  transition to Toprol. latest echocardiogram from August showed ejection fraction 55 to 60%. She was stabilized and cleared for surgery. Patient underwent open reduction internal fixation of right acetabular fracture 07/03/2023 per Dr. Caryn Bee Haddix. Touchdown weightbearing right lower extremity. No posterior hip precautions needed. Her chronic Eliquis has been resumed. Hyponatremia with latest sodium 130 with urine osmolality 206 serum osmolality 275. Patient did have complaints of chronic left shoulder pain with follow-up per orthopedic services felt related to osteoarthritis underwent left subacromial joint injection 07/05/2023 per orthopedic services. Therapy evaluations completed due to patient decreased functional mobility was admitted for a comprehensive rehab program.   Pt presents with decreased activity tolerance, decreased functional mobility, decreased balance, difficulty maintaining precautions, feelings of stress/anxiety Limiting pt's independence with leisure/community pursuits.  Met with pt today to discuss TR services including leisure education, activity analysis/modifications and stress management.  Also discussed the importance of social, emotional, spiritual health in addition to physical health and their effects on overall health and wellness.  Pt stated understanding.  Plan  Min 1 TR session >20 minutes during LOS  Recommendations for other services: None   Discharge Criteria: Patient will be discharged from TR if patient refuses treatment 3 consecutive times without medical reason.  If treatment goals not met, if there is a change in medical status, if patient makes no progress towards goals or if patient is discharged from hospital.  The above assessment, treatment plan, treatment alternatives and goals were discussed and mutually agreed upon: by patient  Verlisa Vara 07/13/2023, 8:45 AM

## 2023-07-13 NOTE — Telephone Encounter (Signed)
Called patient's husband and he stated that the patient was in the hospital, but she had reconciled her medications and clarified what dose of Eliquis she was supposed to be taking. Patient's husband had no further questions at this time.

## 2023-07-13 NOTE — Progress Notes (Signed)
Physical Therapy Session Note  Patient Details  Name: Karen Allison MRN: [id number] Date of Birth: Dec 24, 1945  Today's Date: 07/13/2023 PT Individual Time: 4098-1191 PT Individual Time Calculation (min): 70 min   Short Term Goals: Week 1:  PT Short Term Goal 1 (Week 1): pt will transfer sit<>stand with LRAD and CGA PT Short Term Goal 2 (Week 1): pt will ambulate 32ft with LRAD and CGA PT Short Term Goal 3 (Week 1): pt will perform bed mobility with CGA  Skilled Therapeutic Interventions/Progress Updates:   Received pt semi-reclined in bed, pt agreeable to PT treatment, and reported pain 4/10 in R knee/lower leg - RN notified and present to administer medication. Session with emphasis on functional mobility/transfers, toileting, dressing, global strengthening and endurance, dynamic standing balance/coordination, and gait training. Pt transferred semi-reclined<>sitting R EOB with HOB elevated and use of bedrails with supervision and increased time - pt initially reaching for therapist's hand, but with encouragement able to sit up without physical assist. Donned L shoe and R non-skid sock with max A (for improved adherence to RLE TDWB precautions) and pt transferred bed<>WC<>toilet with RW and CGA while pivoting on LLE. Pt able to manage clothing with min A, void, and perform hygiene management without assist but required min A to stand from low sitting commode.   Doffed gown and donned pull over shirt with set up assist. Donned pants with min A to thread LEs through using reacher and stood with RW and CGA and required min A to pull pants over hips. Pt sat in WC at sink and washed face and brushed hair mod I. Pt performed WC mobility 138ft using BUE and supervision/mod I to dayroom with emphasis on UE strength - placed pillow under L arm for comfort and pt reported TB on L wheel makes propelling chair much easier. Stood from [facility] with RW and CGA and ambulated 65ft with RW and CGA - pt demo  improvements in adherence to RLE TDWB precautions but reported increased difficulty with fatigue.   Pt then performed the following standing exercises with emphasis on LE strength/ROM with rest breaks in between exercises: -R hip flexion 2x10 -R hip abduction 2x10 -L heel raises 2x10 Transferred back into WC with RW and CGA, returned to room, and transferred into recliner with RW and CGA. Concluded session with pt sitting in recliner, needs within reach, and chair pad alarm on.  Therapy Documentation Precautions:  Precautions Precautions: Fall Precaution Comments: active cancer/chemo Restrictions Weight Bearing Restrictions: No RLE Weight Bearing: Touchdown weight bearing  Therapy/Group: Individual Therapy Marlana Salvage Zaunegger Blima Rich PT, DPT 07/13/2023, 6:56 AM

## 2023-07-13 NOTE — Progress Notes (Signed)
Physical Therapy Session Note  Patient Details  Name: Karen Allison MRN: [id number] Date of Birth: 01-Feb-1946  Today's Date: 07/13/2023 PT Individual Time: 7829-5621 PT Individual Time Calculation (min): 26 min   Short Term Goals: Week 1:  PT Short Term Goal 1 (Week 1): pt will transfer sit<>stand with LRAD and CGA PT Short Term Goal 2 (Week 1): pt will ambulate 3ft with LRAD and CGA PT Short Term Goal 3 (Week 1): pt will perform bed mobility with CGA  Skilled Therapeutic Interventions/Progress Updates:      Pt sitting in recliner with BLE elevated. No reports of pain during treatment.   Sit<>stand to RW with CGA with cues for WB restrictions. Ambulates around the foot of her bed, ~41ft, with CGA and RW - continued cues for WB restrictions and for upright posture. Gait antalgic and difficulty complying with full TDWB restrictions.   Wheeled to day room gym and she completed wheelchair level there-ex for RLE strengthening: -1x20 bicep curls with 3# dowel -1x10 chest press with #3 dowel -1x15 LAQ with 2.5# ankle weight -1x10 hip flex with 2.5# ankle weight *rest breaks as needed b/w sets.   Pt transported back to her room and assisted to the recliner with stand pivot transfer using RW and CGA. BLE elevated, chair pad alarm on, call bell within reach at end of session.   Therapy Documentation Precautions:  Precautions Precautions: Fall Precaution Comments: active cancer/chemo Restrictions Weight Bearing Restrictions: No RLE Weight Bearing: Touchdown weight bearing General:    Therapy/Group: Individual Therapy  Orrin Brigham 07/13/2023, 9:38 AM

## 2023-07-13 NOTE — Progress Notes (Signed)
Occupational Therapy Session Note  Patient Details  Name: Karen Allison MRN: [id number] Date of Birth: 1945/12/05  Today's Date: 07/13/2023 OT Individual Time: 1435-1530 OT Individual Time Calculation (min): 55 min    Short Term Goals: Week 1:  OT Short Term Goal 1 (Week 1): Pt will complete sit > stand in prep for ADL with CGA using LRAD OT Short Term Goal 2 (Week 1): Pt will dress LB with min A using AE PRN OT Short Term Goal 3 (Week 1): Pt will complete toilet transfer with CGA using LRAD OT Short Term Goal 4 (Week 1): Pt will improve ROM in LUE in a pain free zone to promote UB dressing with set up A  Skilled Therapeutic Interventions/Progress Updates:     Pt received sitting up in recliner, dressed and ready for the day upon OT arrival. Pt presenting to be in good spirits receptive to skilled OT session reporting 5/10 pain in R hip with pain medication provided from RN prior to session- OT offering intermittent rest breaks, repositioning, and therapeutic support to optimize participation in therapy session. Focus this session BADL retraining, strength/endurance training, and functional transfer training.   Pt requesting BR at beginning of session. Stand pivot recliner>wc using RW CGA +increased time with mod verbal cues to maintain TTWB precautions on R LE. Pt propelled wc into BR and set-up for transfer with min verbal cues for wc positioning to ensure adequate space for RW. Stand pivot wc<>toilet using RW CGA. Pt able to complete 3/3 toileting tasks with CGA- min verbal cues required when doffing pants to alternate hands when pulling down pants in order to maintain single UE support on RW and to maintain TTWB precautions. Pt washed hands in seated position from wc for energy conservation.   Engaged Pt in completing wc propulsion to and from therapy spaces during session for endurance training and to increase B UE strength for BALDs and functional transfers. Pt able to complete  with supervision +increased time.   Pt completed stand pivot wc>EOM using RW CGA. EOM>supine min A to lift R LE. Engaged Pt in LB exercises in supine position to increase strength and provide gentle active stretching of R quad, calf, and hamstring. Pt completed 2x10 reps of the following exercises with OT providing verbal cues and tactile cues for muscle engagement:  -Long Arch Quad Extension -Hip flexion/extension from table top position -Dorsiflexion/plantar flexion -Heel glides  Pt with mild increase in pain 2/2 to stretching through hip during exercises with repositioning and rest break provided. Supine>EOB min A to bring R LE to EOM and lift trunk.   Pt requesting to return to bed at end of session. Stand pivot wc>EOB using RW CGA. Educated Pt on technique for using leg lifter to bring R LE into bed with pt able to demonstrate teach back as evidence of learning with mod verbal cues and light min A required d/t first learning experience. Provided Pt with heated K-pad to R hip for pain management. Pt was left resting in bed with call bell in reach, bed alarm on, and all needs met.    Therapy Documentation Precautions:  Precautions Precautions: Fall Precaution Comments: active cancer/chemo Restrictions Weight Bearing Restrictions: No RLE Weight Bearing: Touchdown weight bearing  Therapy/Group: Individual Therapy  Karen Allison 07/13/2023, 3:15 PM

## 2023-07-13 NOTE — Group Note (Signed)
Patient Details Name: Karen Allison MRN: [id number] DOB: 07-23-1946 Today's Date: 07/13/2023  Time Calculation: OT Group Time Calculation OT Group Start Time: 1430 OT Group Stop Time: 1530 OT Group Time Calculation (min): 60 min      Group Description: Dance Group: Pt participated in dance group with an emphasis on social interaction, motor planning, increasing overall activity tolerance and bimanual tasks. All songs were selected by group members. Dance moves included AROM of BUE/BLE gross motor movements with an emphasis on building functional endurance.    Individual level documentation: Patient completed group from sitting level. Patientt needed supervision to complete various dance moves with OT providing visual model of dance moves.  Patient needed min modifications during group.  Pain: Pain Assessment Pain 4/10 Repositioning and rest breaks provided  Precautions:  R LE TTWB  Karen Allison 07/13/2023, 3:48 PM

## 2023-07-13 NOTE — Progress Notes (Addendum)
Patient ID: Karen Allison, female   DOB: [birth year], 77 y.o.   MRN: [id number]  [facility] referral sent to [facility] Patient declined due to service area.  [facility] referral sent to [facility]

## 2023-07-13 NOTE — Group Note (Signed)
Patient Details Name: Karen Allison MRN: [id number] DOB: 08-Apr-1946 Today's Date: 07/13/2023  Pt will actively participate in 60 minute therapeutic dance group emphasizing activity tolerance.  MET    Group Description: Dance Group: Pt participated in dance group with an emphasis on social interaction, motor planning, increasing overall activity tolerance and bimanual tasks. All songs were selected by group members. Dance moves included AROM of BUE/BLE gross motor movements with an emphasis on building functional endurance.    Individual level documentation: Patient completed group from sitting level. Patient needed supervision to complete various dance moves with demonstration cues for movement.Patient needed minimal modifications during group.  Pt stated that she enjoyed this group.  Pain:no c/o Pain Assessment Pain Scale: 0-10 Pain Score: 7  Pain Type: Acute pain Pain Location: Hip Pain Orientation: Right Pain Radiating Towards:  (to knee) Pain Descriptors / Indicators: Aching Pain Frequency: Constant Pain Onset: On-going Patients Stated Pain Goal: 4 Pain Intervention(s): Medication (See eMAR);Repositioned  Precautions:  RLE:  TDWB, active cancer/chemo    Syeda Prickett 07/13/2023, 1:58 PM

## 2023-07-13 NOTE — Progress Notes (Signed)
PROGRESS NOTE   Subjective/Complaints: Felt groggy this morning, discussed decreasing trazodone to 25mg  at night and she is agreeable Brought in her home omeprazole  ROS: Patient denies fever, rash, sore throat, blurred vision, dizziness, nausea, vomiting, diarrhea, cough, shortness of breath or chest pain,   headache, or mood change. +frequent BM, +right lower extremity cramping, +GERD- uncontrolled with protonix  Objective:   VAS [place] LOWER EXTREMITY VENOUS (DVT)  Result Date: 07/11/2023  Lower Venous DVT Study Patient Name:  Karen Allison [facility]  Date of Exam:   07/11/2023 Medical Rec #: [id number]              Accession #:    [id number] Date of Birth: 1945/09/07               Patient Gender: F Patient Age:   77 years Exam Location:  [facility] Procedure:      VAS [place] LOWER EXTREMITY VENOUS (DVT) Referring Phys: Sula Soda --------------------------------------------------------------------------------  Indications: Pain.  Comparison Study: Previous study on 5.7.2020. Performing Technologist: Fernande Bras  Examination Guidelines: A complete evaluation includes B-mode imaging, spectral Doppler, color Doppler, and power Doppler as needed of all accessible portions of each vessel. Bilateral testing is considered an integral part of a complete examination. Limited examinations for reoccurring indications may be performed as noted. The reflux portion of the exam is performed with the patient in reverse Trendelenburg.  +---------+---------------+---------+-----------+----------+--------------+ RIGHT    CompressibilityPhasicitySpontaneityPropertiesThrombus Aging +---------+---------------+---------+-----------+----------+--------------+ CFV      Full           Yes      Yes                                 +---------+---------------+---------+-----------+----------+--------------+ SFJ      Full                                                         +---------+---------------+---------+-----------+----------+--------------+ FV Prox  Full                                                        +---------+---------------+---------+-----------+----------+--------------+ FV Mid   Full                                                        +---------+---------------+---------+-----------+----------+--------------+ FV DistalFull                                                        +---------+---------------+---------+-----------+----------+--------------+  PFV      Full                                                        +---------+---------------+---------+-----------+----------+--------------+ POP      Full           Yes      Yes                                 +---------+---------------+---------+-----------+----------+--------------+ PTV      Full                                                        +---------+---------------+---------+-----------+----------+--------------+ PERO     Full                                                        +---------+---------------+---------+-----------+----------+--------------+   +----+---------------+---------+-----------+----------+--------------+ LEFTCompressibilityPhasicitySpontaneityPropertiesThrombus Aging +----+---------------+---------+-----------+----------+--------------+ CFV Full           Yes      Yes                                 +----+---------------+---------+-----------+----------+--------------+     Summary: RIGHT: - There is no evidence of deep vein thrombosis in the lower extremity.  - No cystic structure found in the popliteal fossa.  LEFT: - No evidence of common femoral vein obstruction.   *See table(s) above for measurements and observations. Electronically signed by Coral Else MD on 07/11/2023 at 7:42:08 PM.    Final    No results for input(s): "WBC", "HGB", "HCT", "PLT" in the last 72  hours.  Recent Labs    07/11/23 1248  NA 132*  K 4.6  CL 97*  CO2 24  GLUCOSE 111*  BUN 10  CREATININE 0.62  CALCIUM 8.6*    Intake/Output Summary (Last 24 hours) at 07/13/2023 1102 Last data filed at 07/13/2023 0802 Gross per 24 hour  Intake 1080 ml  Output 900 ml  Net 180 ml        Physical Exam: Vital Signs Blood pressure 139/69, pulse 68, temperature 97.8 F (36.6 C), resp. rate 17, height 5\' 1"  (1.549 m), weight 60.5 kg, SpO2 99%. Constitutional: No distress . Vital signs reviewed. HEENT: NCAT, EOMI, oral membranes moist Neck: supple Cardiovascular: RRR without murmur. No JVD    Respiratory/Chest: CTA Bilaterally without wheezes or rales. Normal effort    GI/Abdomen: BS +, non-tender, non-distended Ext: no clubbing, cyanosis, legs with 1+ edema Psych: pleasant and cooperative  Musculoskeletal:     Cervical back: Neck supple. No tenderness.     Comments: Ue's 5-/5 B/L RLE-3/5 HF; KE 3+/5- both limited due to pain DF/PF 5-/5 LLE- HF 4/5; KE 4+/5; DF/PF 5-/5- said has always been weaker since back surgery -exam stable 11/21 -Pain in right proximal thigh/inguinal area with HF Skin:    General: Skin is warm  and dry.     Comments: surgical wound stable. Neuro: Alert and oriented x 3. Normal insight and awareness. Intact Memory. Normal language and speech. Cranial nerve exam unremarkable. Patchy sensory loss in legs.  .     Assessment/Plan: 1. Functional deficits which require 3+ hours per day of interdisciplinary therapy in a comprehensive inpatient rehab setting. Physiatrist is providing close team supervision and 24 hour management of active medical problems listed below. Physiatrist and rehab team continue to assess barriers to discharge/monitor patient progress toward functional and medical goals  Care Tool:  Bathing    Body parts bathed by patient: Right arm, Left arm, Chest, Abdomen, Right upper leg, Left upper leg, Face, Front perineal area,  Buttocks, Right lower leg, Left lower leg   Body parts bathed by helper: Right lower leg, Left lower leg, Front perineal area, Buttocks     Bathing assist Assist Level: Supervision/Verbal cueing     Upper Body Dressing/Undressing Upper body dressing   What is the patient wearing?: Pull over shirt    Upper body assist Assist Level: Set up assist    Lower Body Dressing/Undressing Lower body dressing      What is the patient wearing?: Incontinence brief     Lower body assist Assist for lower body dressing: Contact Guard/Touching assist     Toileting Toileting    Toileting assist Assist for toileting: Minimal Assistance - Patient > 75%     Transfers Chair/bed transfer  Transfers assist     Chair/bed transfer assist level: Contact Guard/Touching assist     Locomotion Ambulation   Ambulation assist      Assist level: Minimal Assistance - Patient > 75% Assistive device: Walker-rolling Max distance: 21 ft   Walk 10 feet activity   Assist     Assist level: Minimal Assistance - Patient > 75% Assistive device: Walker-rolling   Walk 50 feet activity   Assist Walk 50 feet with 2 turns activity did not occur: Safety/medical concerns (fatigue, pain, decreased adherance to RLE TDWB precautions)         Walk 150 feet activity   Assist Walk 150 feet activity did not occur: Safety/medical concerns (fatigue, pain, decreased adherance to RLE TDWB precautions)         Walk 10 feet on uneven surface  activity   Assist Walk 10 feet on uneven surfaces activity did not occur: Safety/medical concerns (fatigue, pain, decreased adherance to RLE TDWB precautions)         Wheelchair     Assist Is the patient using a wheelchair?: Yes Type of Wheelchair: Manual    Wheelchair assist level: Supervision/Verbal cueing Max wheelchair distance: 156ft    Wheelchair 50 feet with 2 turns activity    Assist    Wheelchair 50 feet with 2 turns activity  did not occur:  (fatigue)   Assist Level: Supervision/Verbal cueing   Wheelchair 150 feet activity     Assist  Wheelchair 150 feet activity did not occur:  (fatigue)   Assist Level: Supervision/Verbal cueing   Blood pressure 139/69, pulse 68, temperature 97.8 F (36.6 C), resp. rate 17, height 5\' 1"  (1.549 m), weight 60.5 kg, SpO2 99%.   Medical Problem List and Plan: 1. Functional deficits secondary to right comminuted acetabular/right inferior pubic ramus fracture fracture.  Status post ORIF right acetabular fracture 07/03/2023.  Touchdown weightbearing.  No posterior hip precautions needed             -patient may  shower- cover incision             -  ELOS/Goals: 10-12 days mod I to supervision  Grounds pass ordered  -Continue CIR therapies including PT, OT   2.  Antithrombotics: -DVT/anticoagulation:  Pharmaceutical: Eliquis             -antiplatelet therapy: N/A 3. Pain Management: Neurontin 600 mg 3 times daily, Robaxin 500 mg 4 times daily, hydrocodone as needed- will increase Robaxin to 750 mg q6H  -11/17 order kpad today. Likely mild hip flexor strain 4. Mood/Behavior/Sleep: Valium 10 mg nightly as prior to admission.  Provide emotional support             -antipsychotic agents: N/A 5. Neuropsych/cognition: This patient is capable of making decisions on her own behalf. 6. Skin/Wound Care: Routine skin checks 7. Fluids/Electrolytes/Nutrition: Routine in and outs with follow-up chemistries 8.  Atrial fibrillation/flutter with RVR.  Followed by Dr. Dulce Sellar.  Toprol XL 50 mg nightly.Refused Cardizem 9.  Hypothyroidism.  Synthroid 10.  History of rosacea.  Patient maintained on chronic doxycycline 11.  Hyperlipidemia.  Crestor 12.  Stage IV breast cancer with mets to liver and lung.  Patient receives Trastuzumab infusion every 3 weeks followed by Dr. Gery Pray of oncology services.  Discussed that infusion will have to be postponed to post-rehab 13.  Chronic left  shoulder pain.  Patient did receive steroid injection 07/05/2023 per orthopedic services  -pain mgt as above 14.  History of urinary retention.  Maintained on Flomax as prior to admission.   -pt voiding continently without issue 15. Chronic constipation.  Senokot-S 2 tablets twice daily, Colace 100 mg twice daily, Dulcolax suppository as needed- magnesium gluconate 250mg  ordered HS  11/18: moved bowels all night, decrease senna docusate to 2 tabs HS  11/19: type 5 stool this morning, decrease colace to daily  16. Hyponatremia: encouraged salting foods  17. Suboptimal potassium: kdur ordered 11/15, ordered 11/18, check potassium today  18. Lower ext edema  -11/17 give dose of lasix today x 1  19. Insomnia: increase gabapentin to 1200mg  HS as she takes at home, decrease trazodone to 25mg  HS  20. GERD: d/c protonix since she brought in her home omeprazole. Added tums with meals  21. RLE cramping: VAS [place] ordered and discussed with patient that it is negative  22. Hypocalcemia: added tums with meals, can wean if omeprazole helps with GERD  23. Daytime somnolence: decrease trazodone to 25mg  HS   LOS: 7 days A FACE TO FACE EVALUATION WAS PERFORMED  Kacper Cartlidge P Luchiano Viscomi 07/13/2023, 11:02 AM

## 2023-07-14 ENCOUNTER — Ambulatory Visit: Payer: Medicare Other

## 2023-07-14 MED ORDER — CALCIUM CARBONATE ANTACID 500 MG PO CHEW
1.0000 | CHEWABLE_TABLET | Freq: Three times a day (TID) | ORAL | Status: DC | PRN
  Administered 2023-07-17: 200 mg via ORAL
  Filled 2023-07-14: qty 1

## 2023-07-14 MED ORDER — MAGNESIUM HYDROXIDE 400 MG/5ML PO SUSP
15.0000 mL | Freq: Once | ORAL | Status: AC
  Administered 2023-07-14: 15 mL via ORAL
  Filled 2023-07-14: qty 30

## 2023-07-14 NOTE — Group Note (Signed)
Patient Details Name: Tariah Lamberty MRN: [id number] DOB: [birth year] Today's Date: 07/14/2023  Time Calculation: OT Group Time Calculation OT Group Start Time: 1100 OT Group Stop Time: 1200 OT Group Time Calculation (min): 60 min      Group Description: Stress management: Pt participated in group session with a focus on stress mgmt, education provided on healthy coping strategies, and social interaction as it relates to discharge planing. Focus of session providing coping strategies to manage new diagnosis to allow for improved mental health to increase overall quality of life . Discussed how to utilize deep breathing techniques, meaningful activities, family and friend support, and mindfulness techniques to manage stressors and pain to optimize participation in meaningful activities.  Provided active listening, emotional support and therapeutic use of self. Offered education on factors that protect [place] against stress such as "daily uplifts," "healthy coping strategies" and "protective factors." Encouraged all group members to make an effort to actively recall one event from their day that was a daily uplift in an effort to protect their mindset from stressors as well as sharing this information with their caregivers to facilitate improved caregiver communication and decrease overall burden of care.  Issued pt handouts on healthy coping strategies to implement into routine.   Individual level documentation: Patient participated with full collaboration during session.   Pain: Aching pain in hip reported- Rn informed and repositioning provided  Precautions:    [name] 07/14/2023, 12:36 PM

## 2023-07-14 NOTE — Progress Notes (Signed)
Patient ID: Karen Allison, female   DOB: 06/13/1946, 77 y.o.   MRN: [id number]  Family education changed 11/25 1-3 PM.

## 2023-07-14 NOTE — Progress Notes (Signed)
Physical Therapy Session Note  Patient Details  Name: Karen Allison MRN: [id number] Date of Birth: Feb 25, 1946  Today's Date: 07/14/2023 PT Individual Time: [phone] and [phone] PT Individual Time Calculation (min): 25 min and 56 min  Short Term Goals: Week 1:  PT Short Term Goal 1 (Week 1): pt will transfer sit<>stand with LRAD and CGA PT Short Term Goal 2 (Week 1): pt will ambulate 66ft with LRAD and CGA PT Short Term Goal 3 (Week 1): pt will perform bed mobility with CGA  Skilled Therapeutic Interventions/Progress Updates:   Treatment Session 1 Received pt semi-reclined in bed finishing breakfast. Pt agreeable to PT treatment and did not state pain level during session. Session with emphasis on functional mobility/transfers, dressing, and toileting. Pt transferred semi-reclined<>sitting R EOB with HOB elevated and use of bedrails using leg lifter and mod I with increased time/effort to scoot to EOB - pt reports having adjustable bed at home.  Donned pants, L shoe, and R non-skid sock sitting EOB with max A for time management purposes. Pt transferred bed<>WC<>toilet using (personal) RW and CGA (min A for clothing management) with good adherence to TDWB precautions. Pt able to void and perform hygiene management without assist. Transferred from regular toilet<>WC with RW and CGA and changed shirts with set up assist. Concluded session with pt sitting in [facility] with all needs within reach.   Treatment Session 2 Received pt sitting in recliner, pt agreeable to PT treatment, and reported pain 5/10 in RLE (premedicated). Session with emphasis on functional mobility/transfers, toileting, generalized strengthening and endurance, dynamic standing balance/coordination, and gait training. Pt reported urge to void and donned L shoe with max A.  Pt transferred recliner<>WC<>toilet<>WC with RW and close supervision to stand and CGA for stand<>pivot transfer with good adherence to RLE TDWB  precautions. Pt able to manage clothing with CGA fading to min A, void, and perform hygiene management without assist. Pt sat in WC at sink and performed hand hygiene with se tup assist. Pt then performed WC mobility 127ft using BUE and supervision/mod I to dayroom with emphasis on UE strength.   Stood from [facility] with RW and close supervision and ambulated 2ft with RW and CGA nd increased time demonstrating good adherence to RLE TDWB precautions. Pt then performed seated LLE strengthening on Kinetron at 20 cm/sec for 1 minute x 3 trials with emphasis on glute/quad strength. Transported back to room in [facility] dependently and transferred into recliner with RW and CGA. Concluded session with pt sitting in recliner, needs within reach, and chair pad alarm on. Provided pt with ice pack for R hip and prune juice.    Therapy Documentation Precautions:  Precautions Precautions: Fall Precaution Comments: active cancer/chemo Restrictions Weight Bearing Restrictions: No RLE Weight Bearing: Touchdown weight bearing  Therapy/Group: Individual Therapy Marlana Salvage Zaunegger Blima Rich PT, DPT 07/14/2023, 6:49 AM

## 2023-07-14 NOTE — Group Note (Signed)
Patient Details Name: Karen Allison MRN: [id number] DOB: Jun 06, 1946 Today's Date: 07/14/2023  tress Management/Discharge planning: Goal:  Pt will be provided with education on at least 2 relaxation strategies to assist with anxiety and/or pain management.  MET  Pt will identify concerns about upcoming discharge with min questioning cues.  MEt  Pt participated in group session with an emphasis on coping/stress/anxiety management, pain management, and discharge planning.  Pt provided with education on deep breathing, imagery, visualization, leisure participation.  Group shared discharge concerns with particular  focus on toileting needs at night.  Discussed various options including female urinal, BSC and use of a night light for safety.  Also encouraged self advocacy during remainder of LOS and as they transition home.  Pt fully participatory in discussion   Individual level documentation: Patient participated with full collaboration during session.   Pain:RLE TDWB, cancer/chemo    Precautions:    Megumi Treaster 07/14/2023, 12:44 PM

## 2023-07-14 NOTE — Patient Care Conference (Signed)
Inpatient RehabilitationTeam Conference and Plan of Care Update Date: 07/12/2023   Time: 11:35 AM    Patient Name: Karen Allison      Medical Record Number: [id number]  Date of Birth: [birth year] Sex: Female         Room/Bed: 4W23C/4W23C-01 Payor Info: Payor: MEDICARE / Plan: MEDICARE PART A AND B / Product Type: *No Product type* /    Admit Date/Time:  07/06/2023  1:49 PM  Primary Diagnosis:  Acetabular fracture [facility])  Hospital Problems: Principal Problem:   Acetabular fracture [facility])    Expected Discharge Date: Expected Discharge Date: 07/19/23  Team Members Present: Physician leading conference: Dr. Sula Soda Social Worker Present: Lavera Guise, BSW Nurse Present: Chana Bode, Eden Lathe, RN PT Present: Wynelle Link, PT OT Present: Candee Furbish, OT     Current Status/Progress Goal Weekly Team Focus  Bowel/Bladder   Patient is continent X2   To remain continent X2 with safe transfers to bathroom.   Continue with PT/OT to ensure safe transfer to bathroom when needed.    Swallow/Nutrition/ Hydration               ADL's   Set up A UB, Min/mod A LB and toileting   Supervision overall   Barriers- maintaining TDWB precautions, endurance, RLE/LUE pain    Mobility   bed mobility min A, transfers with RW CGA/min A depending on fatigue, gait 62ft with RW and min A, WC mobility 18ft supervision   supervision overall, mod I WC mobility and bed mobility  barriers: pain, difficulty maintaining RLE TDWB precautions, global weakness/deconditioning    Communication                Safety/Cognition/ Behavioral Observations               Pain   Patient is currently happy with pain management.  Her norco is now scheduled and this works well for her.   To continue to have well managed pain and to reduce pain medication when possible.   Continue to use 1-10 pain scale and remain mindful of the need to reduce pain medication when  possible to reduce risk of falls.    Skin   Patient skin is in very good condition.  One surgical incision is well approximated well healed.   Continue to have good skin health.  COntinue with good nutrition, good bed mobility and good hygeine.      Discharge Planning:  Discharging home with support from spouse, daughter and 2 granddaughters (primarily). 2 steps to enter fornt door, 2 to enter side door and 5 to enter back door.   Team Discussion: Acetabular Fracture. Continent. Maintained on Flomax. Pain managed to right hip with scheduled and PRN medications. Right posterior hip precautions- TDWB.  Incision to abdomen has attached edges without drainage. Refuses some cardiac medications. Managing constipation. Low potassium. Insomnia. Low calcemia. Tolerating regular diet. Barriers are pain, difficulty maintaining precautions, and deconditioning.  Patient on target to meet rehab goals: yes, progressing towards goals with discharge date of 07/19/23  *See Care Plan and progress notes for long and short-term goals.   Revisions to Treatment Plan:  K-pad added. Ongoing education of medications.  Potassium replaced as needed. Adjusting medications for sleep. Tums added. Monitor labs and VS Teaching Needs: Medications, safety, self care, transfer training, incision care, etc.   Current Barriers to Discharge: Wound care, Lack of/limited family support, Weight bearing restrictions, and Medication compliance  Possible Resolutions to Barriers: Family education Independent  with incision care Adhere to weight bearing restrictions Adhere to medication regimen Order recommended DME     Medical Summary Current Status: right hip pain 2/2 acetabular fracture, insomnia, GERD  Barriers to Discharge: Medical stability  Barriers to Discharge Comments: right hip pain 2/2 acetabular fracture, insomnia, GERD Possible Resolutions to [employer] Focus: continue touch down weightbearing, daily  monitoring of incision, hydrocodone for pain controlled- scheuled, gabapentin increased to 1200mg  HS, trazodone 50mg  added HS, pantoprazole increased to BID   Continued Need for Acute Rehabilitation Level of Care: The patient requires daily medical management by a physician with specialized training in physical medicine and rehabilitation for the following reasons: Direction of a multidisciplinary physical rehabilitation program to maximize functional independence : Yes Medical management of patient stability for increased activity during participation in an intensive rehabilitation regime.: Yes Analysis of laboratory values and/or radiology reports with any subsequent need for medication adjustment and/or medical intervention. : Yes   I attest that I was present, lead the team conference, and concur with the assessment and plan of the team.   Jearld Adjutant 07/14/2023, 9:02 AM

## 2023-07-14 NOTE — Progress Notes (Signed)
PROGRESS NOTE   Subjective/Complaints: Felt less groggy with decrease in trazodone to 25mg  GERD improved with omeprazole No issues overnight  ROS: Patient denies fever, rash, sore throat, blurred vision, dizziness, nausea, vomiting, diarrhea, cough, shortness of breath or chest pain,   headache, or mood change. +frequent BM, +right lower extremity cramping, +GERD- uncontrolled with protonix, improved with omeprazole  Objective:   No results found. No results for input(s): "WBC", "HGB", "HCT", "PLT" in the last 72 hours.  Recent Labs    07/11/23 1248  NA 132*  K 4.6  CL 97*  CO2 24  GLUCOSE 111*  BUN 10  CREATININE 0.62  CALCIUM 8.6*    Intake/Output Summary (Last 24 hours) at 07/14/2023 1039 Last data filed at 07/14/2023 0836 Gross per 24 hour  Intake 720 ml  Output --  Net 720 ml        Physical Exam: Vital Signs Blood pressure (!) 127/53, pulse 85, temperature 98.1 F (36.7 C), resp. rate 19, height 5\' 1"  (1.549 m), weight 60.5 kg, SpO2 96%. Constitutional: No distress . Vital signs reviewed. HEENT: NCAT, EOMI, oral membranes moist Neck: supple Cardiovascular: RRR without murmur. No JVD    Respiratory/Chest: CTA Bilaterally without wheezes or rales. Normal effort    GI/Abdomen: BS +, non-tender, non-distended Ext: no clubbing, cyanosis, legs with 1+ edema Psych: pleasant and cooperative  Musculoskeletal:     Cervical back: Neck supple. No tenderness.     Comments: Ue's 5-/5 B/L RLE-3/5 HF; KE 3+/5- both limited due to pain DF/PF 5-/5 LLE- HF 4/5; KE 4+/5; DF/PF 5-/5- said has always been weaker since back surgery -stable 11/22 -Pain in right proximal thigh/inguinal area with HF Skin:    General: Skin is warm and dry.     Comments: surgical wound stable. Neuro: Alert and oriented x 3. Normal insight and awareness. Intact Memory. Normal language and speech. Cranial nerve exam unremarkable. Patchy  sensory loss in legs.  .     Assessment/Plan: 1. Functional deficits which require 3+ hours per day of interdisciplinary therapy in a comprehensive inpatient rehab setting. Physiatrist is providing close team supervision and 24 hour management of active medical problems listed below. Physiatrist and rehab team continue to assess barriers to discharge/monitor patient progress toward functional and medical goals  Care Tool:  Bathing    Body parts bathed by patient: Right arm, Left arm, Chest, Abdomen, Right upper leg, Left upper leg, Face, Front perineal area, Buttocks, Right lower leg, Left lower leg   Body parts bathed by helper: Right lower leg, Left lower leg, Front perineal area, Buttocks     Bathing assist Assist Level: Supervision/Verbal cueing     Upper Body Dressing/Undressing Upper body dressing   What is the patient wearing?: Pull over shirt    Upper body assist Assist Level: Set up assist    Lower Body Dressing/Undressing Lower body dressing      What is the patient wearing?: Incontinence brief     Lower body assist Assist for lower body dressing: Contact Guard/Touching assist     Toileting Toileting    Toileting assist Assist for toileting: Minimal Assistance - Patient > 75%  Transfers Chair/bed transfer  Transfers assist     Chair/bed transfer assist level: Contact Guard/Touching assist     Locomotion Ambulation   Ambulation assist      Assist level: Minimal Assistance - Patient > 75% Assistive device: Walker-rolling Max distance: 21 ft   Walk 10 feet activity   Assist     Assist level: Minimal Assistance - Patient > 75% Assistive device: Walker-rolling   Walk 50 feet activity   Assist Walk 50 feet with 2 turns activity did not occur: Safety/medical concerns (fatigue, pain, decreased adherance to RLE TDWB precautions)         Walk 150 feet activity   Assist Walk 150 feet activity did not occur: Safety/medical concerns  (fatigue, pain, decreased adherance to RLE TDWB precautions)         Walk 10 feet on uneven surface  activity   Assist Walk 10 feet on uneven surfaces activity did not occur: Safety/medical concerns (fatigue, pain, decreased adherance to RLE TDWB precautions)         Wheelchair     Assist Is the patient using a wheelchair?: Yes Type of Wheelchair: Manual    Wheelchair assist level: Supervision/Verbal cueing Max wheelchair distance: 154ft    Wheelchair 50 feet with 2 turns activity    Assist    Wheelchair 50 feet with 2 turns activity did not occur:  (fatigue)   Assist Level: Supervision/Verbal cueing   Wheelchair 150 feet activity     Assist  Wheelchair 150 feet activity did not occur:  (fatigue)   Assist Level: Supervision/Verbal cueing   Blood pressure (!) 127/53, pulse 85, temperature 98.1 F (36.7 C), resp. rate 19, height 5\' 1"  (1.549 m), weight 60.5 kg, SpO2 96%.   Medical Problem List and Plan: 1. Functional deficits secondary to right comminuted acetabular/right inferior pubic ramus fracture fracture.  Status post ORIF right acetabular fracture 07/03/2023.  Touchdown weightbearing.  No posterior hip precautions needed             -patient may  shower- cover incision             -ELOS/Goals: 10-12 days mod I to supervision  Grounds pass ordered  -Continue CIR therapies including PT, OT   2.  Antithrombotics: -DVT/anticoagulation:  Pharmaceutical: Eliquis             -antiplatelet therapy: N/A 3. Pain Management: Neurontin 600 mg 3 times daily, Robaxin 500 mg 4 times daily, hydrocodone as needed- will increase Robaxin to 750 mg q6H  -11/17 order kpad today. Likely mild hip flexor strain 4. Mood/Behavior/Sleep: Valium 10 mg nightly as prior to admission.  Provide emotional support             -antipsychotic agents: N/A 5. Neuropsych/cognition: This patient is capable of making decisions on her own behalf. 6. Skin/Wound Care: Routine skin  checks 7. Fluids/Electrolytes/Nutrition: Routine in and outs with follow-up chemistries 8.  Atrial fibrillation/flutter with RVR.  Followed by Dr. Dulce Sellar.  Toprol XL 50 mg nightly.Refused Cardizem 9.  Hypothyroidism.  Synthroid 10.  History of rosacea.  Patient maintained on chronic doxycycline 11.  Hyperlipidemia.  Crestor 12.  Stage IV breast cancer with mets to liver and lung.  Patient receives Trastuzumab infusion every 3 weeks followed by Dr. Gery Pray of oncology services.  Discussed that infusion will have to be postponed to post-rehab 13.  Chronic left shoulder pain.  Patient did receive steroid injection 07/05/2023 per orthopedic services  -pain mgt as above 14.  History of urinary retention.  Maintained on Flomax as prior to admission.   -pt voiding continently without issue 15. Chronic constipation.  Senokot-S 2 tablets twice daily, Colace 100 mg twice daily, Dulcolax suppository as needed- magnesium gluconate 250mg  ordered HS  11/18: moved bowels all night, decrease senna docusate to 2 tabs HS  11/19: type 5 stool this morning, decrease colace to daily  16. Hyponatremia: encouraged salting foods  17. Suboptimal potassium: kdur ordered 11/15, ordered 11/18, check potassium today  18. Lower ext edema  -11/17 give dose of lasix today x 1  19. Insomnia: increase gabapentin to 1200mg  HS as she takes at home, decrease trazodone to 25mg  HS  20. GERD: d/c protonix since she brought in her home omeprazole. Added tums with meals, decreased to prn since symtoms improved with omeprazole  21. RLE cramping: VAS [place] ordered and discussed with patient that it is negative, calcium supplement added via tums prn  22. Hypocalcemia: added tums with meals, can wean if omeprazole helps with GERD, decrease to prn  23. Daytime somnolence: decrease trazodone to 25mg  HS, improved, continue this dose  23. Hypotension: medications reviewed and nor on any medications purely for  HTN   LOS: 8 days A FACE TO FACE EVALUATION WAS PERFORMED  Drema Pry Jaaliyah Lucatero 07/14/2023, 10:39 AM

## 2023-07-14 NOTE — Progress Notes (Addendum)
Patient ID: Karen Allison, female   DOB: [birth year], 77 y.o.   MRN: [id number]  Patient has all DME. HH approved with [name]. Orders sent.

## 2023-07-14 NOTE — Progress Notes (Signed)
Occupational Therapy Weekly Progress Note  Patient Details  Name: Karen Allison MRN: [id number] Date of Birth: 30-Sep-1945  Beginning of progress report period: July 07, 2023 End of progress report period: July 14, 2023   Patient has met 4 of 4 short term goals.  Pt is making appropriate progress towards LTGs. She is able to bathe with supervision at the shower level, dress with min A using AE and requires CGA/min A for toileting tasks. Pt continues to demonstrate high pain levels in Rt hip/LE, LUE pain/limited AROM, dynamic balance and endurance deficits resulting in difficulty completing BADL tasks without increased physical assist. Pt has improved her physical ability to adhere to RLE TDWB precautions during transfers, but often needs min cues for carryover, and still requires min cues for all functional tasks due to sequencing deficits. Pt will benefit from continued skilled OT services to focus on mentioned deficits. Family ed not initiated as of date but will be required prior to DC but is scheduled for 11/25.   Patient continues to demonstrate the following deficits: muscle weakness, decreased cardiorespiratoy endurance, decreased coordination, decreased problem solving, and decreased standing balance and difficulty maintaining precautions and therefore will continue to benefit from skilled OT intervention to enhance overall performance with BADL, iADL, and Reduce care partner burden.  Patient progressing toward long term goals..  Continue plan of care.  OT Short Term Goals Week 1:  OT Short Term Goal 1 (Week 1): Pt will complete sit > stand in prep for ADL with CGA using LRAD OT Short Term Goal 1 - Progress (Week 1): Met OT Short Term Goal 2 (Week 1): Pt will dress LB with min A using AE PRN OT Short Term Goal 2 - Progress (Week 1): Met OT Short Term Goal 3 (Week 1): Pt will complete toilet transfer with CGA using LRAD OT Short Term Goal 3 - Progress (Week 1): Met OT  Short Term Goal 4 (Week 1): Pt will improve ROM in LUE in a pain free zone to promote UB dressing with set up A OT Short Term Goal 4 - Progress (Week 1): Met Week 2:  OT Short Term Goal 1 (Week 2): STG = LTG due to ELOS    Karen Allison Karen Eriyah Fernando, MS, OTR/L  07/14/2023, 10:29 AM

## 2023-07-14 NOTE — Progress Notes (Signed)
Patient ID: Karen Allison, female   DOB: [birth year], 77 y.o.   MRN: [id number]  Patient informed of Onoclogy appointment arranged on 12/2 at 11:30 AM.

## 2023-07-14 NOTE — Progress Notes (Signed)
Physical Therapy Weekly Progress Note  Patient Details  Name: Karen Allison MRN: [id number] Date of Birth: 1946-01-02  Beginning of progress report period: July 07, 2023 End of progress report period: July 14, 2023  Patient has met 2 of 3 short term goals. Pt demonstrates gradual progress towards long term goals. Pt is currently able to perform bed mobility with supervision using bed features, transfers with RW and CGA (occasionally min A from lower surfaces), ambulate up to 46ft with RW and CGA, and perform WC mobility 131ft using BUE and supervision/mod I. Pt continues to demonstrate difficulty with adherence to RLE TDWB precautions and is limited by pain in RLE along with generalized weakness/deconditioning. Family education scheduled for 11/25.  Patient continues to demonstrate the following deficits muscle weakness, decreased cardiorespiratoy endurance, and decreased standing balance, decreased postural control, decreased balance strategies, and difficulty maintaining precautions and therefore will continue to benefit from skilled PT intervention to increase functional independence with mobility.  Patient progressing toward long term goals..  Continue plan of care.  PT Short Term Goals Week 1:  PT Short Term Goal 1 (Week 1): pt will transfer sit<>stand with LRAD and CGA PT Short Term Goal 1 - Progress (Week 1): Met PT Short Term Goal 2 (Week 1): pt will ambulate 76ft with LRAD and CGA PT Short Term Goal 2 - Progress (Week 1): Progressing toward goal PT Short Term Goal 3 (Week 1): pt will perform bed mobility with CGA PT Short Term Goal 3 - Progress (Week 1): Met Week 2:  PT Short Term Goal 1 (Week 2): STG=LTG due to LOS  Skilled Therapeutic Interventions/Progress Updates:  Ambulation/gait training;Discharge planning;Functional mobility training;Psychosocial support;Therapeutic Activities;Balance/vestibular training;Disease management/prevention;Neuromuscular  re-education;Skin care/wound management;Therapeutic Exercise;Wheelchair propulsion/positioning;DME/adaptive equipment instruction;Pain management;Splinting/orthotics;UE/LE Strength taining/ROM;Community reintegration;Functional electrical stimulation;Patient/family education;Stair training;UE/LE Coordination activities   Therapy Documentation Precautions:  Precautions Precautions: Fall Precaution Comments: active cancer/chemo Restrictions Weight Bearing Restrictions: No RLE Weight Bearing: Touchdown weight bearing  Therapy/Group: Individual Therapy Marlana Salvage Zaunegger Blima Rich PT, DPT 07/14/2023, 6:59 AM

## 2023-07-14 NOTE — Progress Notes (Signed)
Occupational Therapy Session Note  Patient Details  Name: Karen Allison MRN: [id number] Date of Birth: 05/21/1946  Today's Date: 07/14/2023 OT Individual Time: 4098-1191 OT Individual Time Calculation (min): 55 min    Short Term Goals: Week 1:  OT Short Term Goal 1 (Week 1): Pt will complete sit > stand in prep for ADL with CGA using LRAD OT Short Term Goal 2 (Week 1): Pt will dress LB with min A using AE PRN OT Short Term Goal 3 (Week 1): Pt will complete toilet transfer with CGA using LRAD OT Short Term Goal 4 (Week 1): Pt will improve ROM in LUE in a pain free zone to promote UB dressing with set up A  Skilled Therapeutic Interventions/Progress Updates:  Skilled OT intervention completed with focus on cervical AROM, AAROM LUE, pain management. Pt received seated in w/c, agreeable to session. 5/10 pain reported in Lt shoulder; pre-medicated. OT offered hot pack, rest breaks and repositioning throughout for pain reduction.  Pt declined self-care needs. Transported dependently in w/c <> gym for time. OT applied hot pack to Lt shoulder in prep for ROM to reduce pain. Seated at table top, pt completed the following activities to promote functional use of BUE needed for independence with BADLs: AROM (x15 each side) -Lateral cervical flexion -Levator scapulae flexion -Shoulder retraction  Table slides on LUE/BUE (x15) -shoulder flexion; pt limited to < 120 deg -horizontal abduction -shoulder external rotation; pt lacking about 50% ROM with pt report that her primary MD has recommended shoulder replacement but pt hasn't been willing due to other medical conditions  HEP issued to pt for the above to assist with pain/ROM at home as pt compensates with BUE on RW during functional mobility. Hot pack removed after about 30 mins of application without skin irritation of redness noted.  Direct handoff to rehab tech for participation in group session, with all needs met at end of  session.   Therapy Documentation Precautions:  Precautions Precautions: Fall Precaution Comments: active cancer/chemo Restrictions Weight Bearing Restrictions: No RLE Weight Bearing: Touchdown weight bearing    Therapy/Group: Individual Therapy  Melvyn Novas, MS, OTR/L  07/14/2023, 12:29 PM

## 2023-07-15 MED ORDER — POTASSIUM CHLORIDE CRYS ER 20 MEQ PO TBCR
40.0000 meq | EXTENDED_RELEASE_TABLET | Freq: Once | ORAL | Status: AC
  Administered 2023-07-15: 40 meq via ORAL
  Filled 2023-07-15: qty 2

## 2023-07-15 MED ORDER — DOCUSATE SODIUM 100 MG PO CAPS
200.0000 mg | ORAL_CAPSULE | Freq: Two times a day (BID) | ORAL | Status: DC
  Administered 2023-07-15 – 2023-07-17 (×4): 200 mg via ORAL
  Filled 2023-07-15 (×4): qty 2

## 2023-07-15 MED ORDER — FUROSEMIDE 20 MG PO TABS
20.0000 mg | ORAL_TABLET | Freq: Once | ORAL | Status: AC
  Administered 2023-07-15: 20 mg via ORAL
  Filled 2023-07-15: qty 1

## 2023-07-15 NOTE — Progress Notes (Signed)
PROGRESS NOTE   Subjective/Complaints: No acute complaints.  No events overnight.  Does feel like her right lower extremity is somewhat more swollen, and endorse that she has p.o. Lasix 20 to 40 mg at home that she takes as needed for edema; confirmed this on chart review, and offered to patient today.  Right hip pain generally stable.  Vital stable.  ROS: Patient denies fever, rash, sore throat, blurred vision, dizziness, nausea, vomiting, diarrhea, cough, shortness of breath or chest pain,   headache, or mood change. +frequent BM, +right lower extremity cramping, + edema-worse, +GERD- uncontrolled with protonix, improved with omeprazole  Objective:   No results found. No results for input(s): "WBC", "HGB", "HCT", "PLT" in the last 72 hours.  No results for input(s): "NA", "K", "CL", "CO2", "GLUCOSE", "BUN", "CREATININE", "CALCIUM" in the last 72 hours.   Intake/Output Summary (Last 24 hours) at 07/15/2023 2222 Last data filed at 07/15/2023 1900 Gross per 24 hour  Intake 712 ml  Output 800 ml  Net -88 ml        Physical Exam: Vital Signs Blood pressure (!) 123/45, pulse 75, temperature (!) 97.4 F (36.3 C), resp. rate 17, height 5\' 1"  (1.549 m), weight 60.5 kg, SpO2 100%. Constitutional: No distress . Vital signs reviewed.  Sitting in bedside wheelchair. HEENT: NCAT, EOMI, oral membranes moist Neck: supple Cardiovascular: RRR without murmur. No JVD  .  1+ pitting edema right lower extremity, trace left lower extremity. Respiratory/Chest: CTA Bilaterally without wheezes or rales. Normal effort    GI/Abdomen: BS +, non-tender, non-distended Ext: no clubbing, cyanosis, legs with 1+ edema Psych: pleasant and cooperative  Musculoskeletal:  Moving all 4 limbs antigravity against resistance; prior exams as below     Cervical back: Neck supple. No tenderness.     Comments: Ue's 5-/5 B/L RLE-3/5 HF; KE 3+/5- both limited  due to pain DF/PF 5-/5 LLE- HF 4/5; KE 4+/5; DF/PF 5-/5- said has always been weaker since back surgery -stable 11/22 -Pain in right proximal thigh/inguinal area with HF   Skin:    General: Skin is warm and dry.     Comments: surgical wound stable -no apparent drainage on bandaging. Neuro: Alert and oriented x 3. Normal insight and awareness. Intact Memory. Normal language and speech. Cranial nerve exam unremarkable.  No appreciable sensory loss in lower extremities.  .     Assessment/Plan: 1. Functional deficits which require 3+ hours per day of interdisciplinary therapy in a comprehensive inpatient rehab setting. Physiatrist is providing close team supervision and 24 hour management of active medical problems listed below. Physiatrist and rehab team continue to assess barriers to discharge/monitor patient progress toward functional and medical goals  Care Tool:  Bathing    Body parts bathed by patient: Right arm, Left arm, Chest, Abdomen, Right upper leg, Left upper leg, Face, Front perineal area, Buttocks, Right lower leg, Left lower leg   Body parts bathed by helper: Right lower leg, Left lower leg, Front perineal area, Buttocks     Bathing assist Assist Level: Supervision/Verbal cueing     Upper Body Dressing/Undressing Upper body dressing   What is the patient wearing?: Pull over shirt  Upper body assist Assist Level: Set up assist    Lower Body Dressing/Undressing Lower body dressing      What is the patient wearing?: Incontinence brief     Lower body assist Assist for lower body dressing: Contact Guard/Touching assist     Toileting Toileting    Toileting assist Assist for toileting: Minimal Assistance - Patient > 75%     Transfers Chair/bed transfer  Transfers assist     Chair/bed transfer assist level: Contact Guard/Touching assist     Locomotion Ambulation   Ambulation assist      Assist level: Contact Guard/Touching assist Assistive  device: Walker-rolling Max distance: 44ft   Walk 10 feet activity   Assist     Assist level: Contact Guard/Touching assist Assistive device: Walker-rolling   Walk 50 feet activity   Assist Walk 50 feet with 2 turns activity did not occur: Safety/medical concerns (fatigue, pain, decreased adherance to RLE TDWB precautions)         Walk 150 feet activity   Assist Walk 150 feet activity did not occur: Safety/medical concerns (fatigue, pain, decreased adherance to RLE TDWB precautions)         Walk 10 feet on uneven surface  activity   Assist Walk 10 feet on uneven surfaces activity did not occur: Safety/medical concerns (fatigue, pain, decreased adherance to RLE TDWB precautions)         Wheelchair     Assist Is the patient using a wheelchair?: Yes Type of Wheelchair: Manual    Wheelchair assist level: Supervision/Verbal cueing Max wheelchair distance: 157ft    Wheelchair 50 feet with 2 turns activity    Assist    Wheelchair 50 feet with 2 turns activity did not occur:  (fatigue)   Assist Level: Supervision/Verbal cueing   Wheelchair 150 feet activity     Assist  Wheelchair 150 feet activity did not occur:  (fatigue)   Assist Level: Supervision/Verbal cueing   Blood pressure (!) 123/45, pulse 75, temperature (!) 97.4 F (36.3 C), resp. rate 17, height 5\' 1"  (1.549 m), weight 60.5 kg, SpO2 100%.   Medical Problem List and Plan: 1. Functional deficits secondary to right comminuted acetabular/right inferior pubic ramus fracture fracture.  Status post ORIF right acetabular fracture 07/03/2023.  Touchdown weightbearing.  No posterior hip precautions needed             -patient may  shower- cover incision             -ELOS/Goals: 10-12 days mod I to supervision  Grounds pass ordered  -Continue CIR therapies including PT, OT   2.  Antithrombotics: -DVT/anticoagulation:  Pharmaceutical: Eliquis             -antiplatelet therapy: N/A 3. Pain  Management: Neurontin 600 mg 3 times daily, Robaxin 500 mg 4 times daily, hydrocodone as needed- will increase Robaxin to 750 mg q6H  -11/17 order kpad today. Likely mild hip flexor strain 4. Mood/Behavior/Sleep: Valium 10 mg nightly as prior to admission.  Provide emotional support             -antipsychotic agents: N/A 5. Neuropsych/cognition: This patient is capable of making decisions on her own behalf. 6. Skin/Wound Care: Routine skin checks 7. Fluids/Electrolytes/Nutrition: Routine in and outs with follow-up chemistries 8.  Atrial fibrillation/flutter with RVR.  Followed by Dr. Dulce Sellar.  Toprol XL 50 mg nightly.Refused Cardizem 9.  Hypothyroidism.  Synthroid 10.  History of rosacea.  Patient maintained on chronic doxycycline 11.  Hyperlipidemia.  Crestor  12.  Stage IV breast cancer with mets to liver and lung.  Patient receives Trastuzumab infusion every 3 weeks followed by Dr. Gery Pray of oncology services.  Discussed that infusion will have to be postponed to post-rehab 13.  Chronic left shoulder pain.  Patient did receive steroid injection 07/05/2023 per orthopedic services  -pain mgt as above 14.  History of urinary retention.  Maintained on Flomax as prior to admission.   -pt voiding continently without issue 15. Chronic constipation.  Senokot-S 2 tablets twice daily, Colace 100 mg twice daily, Dulcolax suppository as needed- magnesium gluconate 250mg  ordered HS  11/18: moved bowels all night, decrease senna docusate to 2 tabs HS  11/19: type 5 stool this morning, decrease colace to daily  Last bowel movement 11-23  16. Hyponatremia: encouraged salting foods  17. Suboptimal potassium: kdur ordered 11/15, ordered 11/18, check potassium today  18. Lower ext edema  -11/17 give dose of lasix today x 1  11-23: 20 mg as needed Lasix given, along with 40 mill equivalents potassium  19. Insomnia: increase gabapentin to 1200mg  HS as she takes at home, decrease  trazodone to 25mg  HS  20. GERD: d/c protonix since she brought in her home omeprazole. Added tums with meals, decreased to prn since symtoms improved with omeprazole  21. RLE cramping: VAS [place] ordered and discussed with patient that it is negative, calcium supplement added via tums prn  22. Hypocalcemia: added tums with meals, can wean if omeprazole helps with GERD, decrease to prn  23. Daytime somnolence: decrease trazodone to 25mg  HS, improved, continue this dose 23. Hypotension: medications reviewed and nor on any medications purely for HTN   LOS: 9 days A FACE TO FACE EVALUATION WAS PERFORMED  Angelina Sheriff 07/15/2023, 10:22 PM

## 2023-07-15 NOTE — Progress Notes (Signed)
Physical Therapy Session Note  Patient Details  Name: Karen Allison MRN: [id number] Date of Birth: 1946-04-30  Today's Date: 07/15/2023 PT Individual Time: 0347-4259 PT Individual Time Calculation (min): 56 min   Short Term Goals: Week 1:  PT Short Term Goal 1 (Week 1): pt will transfer sit<>stand with LRAD and CGA PT Short Term Goal 1 - Progress (Week 1): Met PT Short Term Goal 2 (Week 1): pt will ambulate 28ft with LRAD and CGA PT Short Term Goal 2 - Progress (Week 1): Progressing toward goal PT Short Term Goal 3 (Week 1): pt will perform bed mobility with CGA PT Short Term Goal 3 - Progress (Week 1): Met Week 2:  PT Short Term Goal 1 (Week 2): STG=LTG due to LOS  Skilled Therapeutic Interventions/Progress Updates:   Received pt sitting in recliner, pt agreeable to PT treatment, and reported pain 6/10 in R knee and below (premedicated). Session with emphasis on functional mobility/transfers, generalized strengthening and endurance, dynamic standing balance/coordination, simulated car transfers.   Pt performed all transfers with RW and CGA throughout session (able to stand with close supervision). Pt transported to/from room in [facility] dependently for time management purposes. Pt performed simulated car transfer with RW and CGA and able to manage RLE without assist. Pt transferred on/off Nustep with RW and CGA and performed seated BUE and RLE strengthening on Nustep at workload 3 for 12 minutes for a total of 350 steps with emphasis on cardiovascular endurance. Pt then performed the following LE exercises with emphasis on LE strength/ROM: - knee extensions 2x10 bilaterally with 0.5lb ankle weight on RLE and 1.5lb ankle weight on LLE - hip flexion 2x10 bilaterally with 0.5lb ankle weight on RLE and 1.5lb ankle weight on LLE Returned to room and concluded session with pt sitting in recliner, needs within reach, and chair pad alarm on. RN present at bedside attending to care.   Therapy  Documentation Precautions:  Precautions Precautions: Fall Precaution Comments: active cancer/chemo Restrictions Weight Bearing Restrictions: No RLE Weight Bearing: Touchdown weight bearing  Therapy/Group: Individual Therapy Karen Allison PT, DPT 07/15/2023, 6:47 AM

## 2023-07-15 NOTE — Progress Notes (Signed)
Occupational Therapy Session Note  Patient Details  Name: Karen Allison MRN: [id number] Date of Birth: [birth year]  Today's Date: 07/15/2023 OT Individual Time: 6295-2841 OT Individual Time Calculation (min): 40 min    Short Term Goals: Week 2:  OT Short Term Goal 1 (Week 2): STG = LTG due to ELOS  Skilled Therapeutic Interventions/Progress Updates:  Skilled OT intervention completed with focus on ADL retraining, functional transfers, functional ambulation. Pt received sideways in bed with legs hanging over EOB with leg lifter with pt stating "I was trying to get myself back into bed." Pt agreeable to session. Rt knee pain reported; nurse present during session to administer meds. OT offered rest breaks and repositioning throughout for pain reduction.  Pt was able to sit EOB with supervision with ++ time. CGA sit > stand and stand pivot transfer with RW with tactile cues needed for RLE TDWB precautions as pt with poor correction with only verbal cues. Self-propelled in w/c > sink. Completed oral care, UB bathing with set up A. UB dressing for over head shirt with set up A, supervision for zip jacket due to LUE weakness/ROM deficits with cues needed to donn LUE first. Research officer, political party for LB dressing, with pt able to do all parts with CGA only for sit > stand and standing balance with unilateral UE support on sink. Total A to donn slip on LLE shoe, RLE left off to encourage off loading during mobility.   Pt self-propelled in w/c <> hallway with mod I. Pt then ambulated with CGA about 25 ft using RW with cues needed for RLE TDWB adherence as fatigue increased, to promote home level mobility needed at DC. Back in room, pt completed sit > stand and stand pivot > recliner with RW and CGA. Pt remained seated in recliner, heating pad applied to Rt hip, with chair alarm on/activated, and with all needs in reach at end of session.   Therapy Documentation Precautions:  Precautions Precautions:  Fall Precaution Comments: active cancer/chemo Restrictions Weight Bearing Restrictions: Yes RLE Weight Bearing: Touchdown weight bearing    Therapy/Group: Individual Therapy  Melvyn Novas, MS, OTR/L  07/15/2023, 8:49 AM

## 2023-07-16 MED ORDER — HYDROCODONE-ACETAMINOPHEN 5-325 MG PO TABS
1.0000 | ORAL_TABLET | Freq: Every evening | ORAL | Status: DC | PRN
  Administered 2023-07-18: 1 via ORAL
  Filled 2023-07-16: qty 1

## 2023-07-16 NOTE — Progress Notes (Signed)
PROGRESS NOTE   Subjective/Complaints: No acute complaints.  No events overnight.  Vitals stable Mild 5/10 R hip pain overnight; patient states that daytime only schedule makes it so that she wakes up overnight often in intense pain.  Asking for as needed to use at night.  ROS: Patient denies fever, rash, sore throat, blurred vision, dizziness, nausea, vomiting, diarrhea, cough, shortness of breath or chest pain,   headache, or mood change. + Left hip pain.  Objective:   No results found. No results for input(s): "WBC", "HGB", "HCT", "PLT" in the last 72 hours.  No results for input(s): "NA", "K", "CL", "CO2", "GLUCOSE", "BUN", "CREATININE", "CALCIUM" in the last 72 hours.   Intake/Output Summary (Last 24 hours) at 07/16/2023 0923 Last data filed at 07/15/2023 1900 Gross per 24 hour  Intake 594 ml  Output --  Net 594 ml        Physical Exam: Vital Signs Blood pressure 135/62, pulse 71, temperature 98.3 F (36.8 C), resp. rate 17, height 5\' 1"  (1.549 m), weight 60.5 kg, SpO2 98%. Constitutional: No distress . Vital signs reviewed.  Sitting up in bed. HEENT: NCAT, EOMI, oral membranes moist Neck: supple Cardiovascular: RRR without murmur. No JVD  .  Trace pitting edema bilateral lower extremities.  Respiratory/Chest: CTA Bilaterally without wheezes or rales. Normal effort    GI/Abdomen: BS +, non-tender, non-distended Ext: no clubbing, cyanosis Psych: pleasant and cooperative  Neuro: Awake, alert, oriented x 4.  No apparent deficits. Strength:  Ue's 5-/5 B/L RLE-4/5 HF; KE 4+/5-improved. DF/PF 5-/5 LLE- HF 4/5; KE 4+/5; DF/PF 5-/5-unchanged  Skin: Surgical wound stable.  MSK: Full active range of motion in hip internal and external rotation, without discomfort    Assessment/Plan: 1. Functional deficits which require 3+ hours per day of interdisciplinary therapy in a comprehensive inpatient rehab  setting. Physiatrist is providing close team supervision and 24 hour management of active medical problems listed below. Physiatrist and rehab team continue to assess barriers to discharge/monitor patient progress toward functional and medical goals  Care Tool:  Bathing    Body parts bathed by patient: Right arm, Left arm, Chest, Abdomen, Right upper leg, Left upper leg, Face, Front perineal area, Buttocks, Right lower leg, Left lower leg   Body parts bathed by helper: Right lower leg, Left lower leg, Front perineal area, Buttocks     Bathing assist Assist Level: Supervision/Verbal cueing     Upper Body Dressing/Undressing Upper body dressing   What is the patient wearing?: Pull over shirt    Upper body assist Assist Level: Set up assist    Lower Body Dressing/Undressing Lower body dressing      What is the patient wearing?: Incontinence brief     Lower body assist Assist for lower body dressing: Contact Guard/Touching assist     Toileting Toileting    Toileting assist Assist for toileting: Minimal Assistance - Patient > 75%     Transfers Chair/bed transfer  Transfers assist     Chair/bed transfer assist level: Contact Guard/Touching assist     Locomotion Ambulation   Ambulation assist      Assist level: Contact Guard/Touching assist Assistive device: Walker-rolling Max distance: 55ft  Walk 10 feet activity   Assist     Assist level: Contact Guard/Touching assist Assistive device: Walker-rolling   Walk 50 feet activity   Assist Walk 50 feet with 2 turns activity did not occur: Safety/medical concerns (fatigue, pain, decreased adherance to RLE TDWB precautions)         Walk 150 feet activity   Assist Walk 150 feet activity did not occur: Safety/medical concerns (fatigue, pain, decreased adherance to RLE TDWB precautions)         Walk 10 feet on uneven surface  activity   Assist Walk 10 feet on uneven surfaces activity did not  occur: Safety/medical concerns (fatigue, pain, decreased adherance to RLE TDWB precautions)         Wheelchair     Assist Is the patient using a wheelchair?: Yes Type of Wheelchair: Manual    Wheelchair assist level: Supervision/Verbal cueing Max wheelchair distance: 149ft    Wheelchair 50 feet with 2 turns activity    Assist    Wheelchair 50 feet with 2 turns activity did not occur:  (fatigue)   Assist Level: Supervision/Verbal cueing   Wheelchair 150 feet activity     Assist  Wheelchair 150 feet activity did not occur:  (fatigue)   Assist Level: Supervision/Verbal cueing   Blood pressure 135/62, pulse 71, temperature 98.3 F (36.8 C), resp. rate 17, height 5\' 1"  (1.549 m), weight 60.5 kg, SpO2 98%.   Medical Problem List and Plan: 1. Functional deficits secondary to right comminuted acetabular/right inferior pubic ramus fracture fracture.  Status post ORIF right acetabular fracture 07/03/2023.  Touchdown weightbearing.  No posterior hip precautions needed             -patient may  shower- cover incision             -ELOS/Goals: 10-12 days mod I to supervision  Grounds pass ordered  -Continue CIR therapies including PT, OT   2.  Antithrombotics: -DVT/anticoagulation:  Pharmaceutical: Eliquis             -antiplatelet therapy: N/A 3. Pain Management: Neurontin 600 mg 3 times daily, Robaxin 500 mg 4 times daily, hydrocodone as needed- will increase Robaxin to 750 mg q6H  -11/17 order kpad today. Likely mild hip flexor strain  11-24: Ordered extra Vicodin nightly as needed  4. Mood/Behavior/Sleep: Valium 10 mg nightly as prior to admission.  Provide emotional support             -antipsychotic agents: N/A 5. Neuropsych/cognition: This patient is capable of making decisions on her own behalf. 6. Skin/Wound Care: Routine skin checks 7. Fluids/Electrolytes/Nutrition: Routine in and outs with follow-up chemistries 8.  Atrial fibrillation/flutter with RVR.   Followed by Dr. Dulce Sellar.  Toprol XL 50 mg nightly.Refused Cardizem 9.  Hypothyroidism.  Synthroid 10.  History of rosacea.  Patient maintained on chronic doxycycline 11.  Hyperlipidemia.  Crestor 12.  Stage IV breast cancer with mets to liver and lung.  Patient receives Trastuzumab infusion every 3 weeks followed by Dr. Gery Pray of oncology services.  Discussed that infusion will have to be postponed to post-rehab 13.  Chronic left shoulder pain.  Patient did receive steroid injection 07/05/2023 per orthopedic services  -pain mgt as above 14.  History of urinary retention.  Maintained on Flomax as prior to admission.   -pt voiding continently without issue 15. Chronic constipation.  Senokot-S 2 tablets twice daily, Colace 100 mg twice daily, Dulcolax suppository as needed- magnesium gluconate 250mg  ordered HS  11/18:  moved bowels all night, decrease senna docusate to 2 tabs HS  11/19: type 5 stool this morning, decrease colace to daily  Last bowel movement 11-23  16. Hyponatremia: encouraged salting foods  17. Suboptimal potassium: kdur ordered 11/15, ordered 11/18, check potassium today  18. Lower ext edema  -11/17 give dose of lasix today x 1  11-23: 20 mg as needed Lasix given, along with 40 mill equivalents potassium  11-24: Improved per patient.  19. Insomnia: increase gabapentin to 1200mg  HS as she takes at home, decrease trazodone to 25mg  HS  20. GERD: d/c protonix since she brought in her home omeprazole. Added tums with meals, decreased to prn since symtoms improved with omeprazole  21. RLE cramping: VAS [place] ordered and discussed with patient that it is negative, calcium supplement added via tums prn  22. Hypocalcemia: added tums with meals, can wean if omeprazole helps with GERD, decrease to prn  23. Daytime somnolence: decrease trazodone to 25mg  HS, improved, continue this dose -No notable over this weekend  23. Hypotension: medications reviewed and nor  on any medications purely for HTN -Vitals have remained stable, no symptomatic orthostasis  LOS: 10 days A FACE TO FACE EVALUATION WAS PERFORMED  Angelina Sheriff 07/16/2023, 9:23 AM

## 2023-07-17 DIAGNOSIS — G4700 Insomnia, unspecified: Secondary | ICD-10-CM

## 2023-07-17 DIAGNOSIS — M25551 Pain in right hip: Secondary | ICD-10-CM

## 2023-07-17 DIAGNOSIS — K219 Gastro-esophageal reflux disease without esophagitis: Secondary | ICD-10-CM

## 2023-07-17 DIAGNOSIS — I959 Hypotension, unspecified: Secondary | ICD-10-CM

## 2023-07-17 MED ORDER — CALCIUM CARBONATE ANTACID 500 MG PO CHEW: 1.0000 | CHEWABLE_TABLET | Freq: Three times a day (TID) | ORAL | Status: DC | PRN

## 2023-07-17 MED ORDER — TRAZODONE HCL 50 MG PO TABS: 25.0000 mg | ORAL_TABLET | Freq: Every evening | ORAL | Status: DC | PRN

## 2023-07-17 MED ORDER — DOCUSATE SODIUM 100 MG PO CAPS
200.0000 mg | ORAL_CAPSULE | Freq: Every day | ORAL | Status: DC
  Administered 2023-07-17 – 2023-07-18 (×2): 200 mg via ORAL
  Filled 2023-07-17 (×2): qty 2

## 2023-07-17 MED ORDER — CALCIUM CARBONATE ANTACID 500 MG PO CHEW
1.0000 | CHEWABLE_TABLET | Freq: Three times a day (TID) | ORAL | Status: DC
  Administered 2023-07-17 – 2023-07-19 (×5): 200 mg via ORAL
  Filled 2023-07-17 (×5): qty 1

## 2023-07-17 MED ORDER — SENNOSIDES-DOCUSATE SODIUM 8.6-50 MG PO TABS
2.0000 | ORAL_TABLET | Freq: Every day | ORAL | Status: DC
  Administered 2023-07-17 – 2023-07-18 (×2): 2 via ORAL
  Filled 2023-07-17 (×2): qty 2

## 2023-07-17 MED ORDER — METHOCARBAMOL 500 MG PO TABS
500.0000 mg | ORAL_TABLET | Freq: Four times a day (QID) | ORAL | Status: DC
  Administered 2023-07-17 – 2023-07-19 (×7): 500 mg via ORAL
  Filled 2023-07-17 (×7): qty 1

## 2023-07-17 NOTE — Progress Notes (Signed)
Physical Therapy Session Note  Patient Details  Name: Karen Allison MRN: [id number] Date of Birth: 11-Aug-1946  Today's Date: 07/17/2023 PT Individual Time: 1015-1057 + 1400-1458  PT Individual Time Calculation (min): 42 min  + 58 min  Short Term Goals: Week 2:  PT Short Term Goal 1 (Week 2): STG=LTG due to LOS  Skilled Therapeutic Interventions/Progress Updates:      1st session: Pt sitting in recliner to start. Reports 6/10 R hip and leg pain - LPN made aware who provided pain Rx during treatment. Discussed general DC planning, DME rec's, follow up therapy rec's, etc - patient appears to have all needed DME and good understanding of her DC plan. She confirms that her family is coming this PM for family ed/training.   Sit<>stand to RW with supervision and completes stand pivot transfer with supervision and RW - cues for TDWB precautions and general safety measures.  Transported in w/c to ortho rehab gym. Instructed in ramp navigation where she ambulated up/down x60ft ramp with CGA and RW - continued cues for WB awareness and for RW management to keep from getting away from her. No LOB or knee buckling and adherent to WB restrictions.   Gait training 47ft with supervision and RW - demonstrates step-to gait pattern, antalgic on R, forward flexed at the hips. Cues for correcting and maintaining WB restrictions throughout. Gait distance limited by fatigue.   Tried to set patient up on the Sci[employer] UE ergometer but patient lacking adequate ROM on her L shoulder to complete, even with adjustments made.   Returned to her room and patient assisted back to the recliner with supervision assist and RW, stand pivot transfer. BLE elevated, call bell in reach, and heating pad for R hip on. All needs met.    2nd session: Pt sitting in w/c on arrival with family (grand daughter and husband) present for family education and training. Pt reports generalized R hip pain - rest breaks and mobility  provided for pain management.  Reviewed DME rec's, follow up therapies, home safety, and fall prevention. Reviewed TDWB precautions, PT goals, PT POC, and pt's level of mobility at home. Showed them XR of her R hip to help understand why the recommendation is TDWB.  Pt and family inquiring on if orthopedics will follow up - sent Maralyn Sago PA a message who reports she will stop by before DC.   Pt transported in w/c to ortho rehab gym. Completed car transfer with supervision and RW with car height simulating their mini-van which she will be going home in. [place] daughter providing supervision/CGA for mobility and she demonstrated great ability to cue and manage patient safely. Patient then demonstrated ability to navigate the ramp with CGA and RW - recommended wheelchair level for the ramp at DC for safety and ease of transition into home environment.   Pt completed Nustep x6 minutes at L7 resistance with LLE and BUE only (RLE supported off the Nustep due to WB restrictions) - emphasis on cadence and full ROM to challenge cardiovascular endurance and strengthening.  Pt returned to her room and pt ended session in bed with alarm on, family present. All needs met - all report confidence re: DC plan.       Therapy Documentation Precautions:  Precautions Precautions: Fall Precaution Comments: active cancer/chemo Restrictions Weight Bearing Restrictions: Yes RLE Weight Bearing: Touchdown weight bearing General:     Therapy/Group: Individual Therapy  Orrin Brigham 07/17/2023, 7:46 AM

## 2023-07-17 NOTE — Progress Notes (Signed)
Occupational Therapy Session Note  Patient Details  Name: Karen Allison MRN: [id number] Date of Birth: 1945-08-27  Today's Date: 07/17/2023 OT Individual Time: 0865-7846 & 1305-1400 OT Individual Time Calculation (min): 40 min & 55 min   Short Term Goals: Week 2:  OT Short Term Goal 1 (Week 2): STG = LTG due to ELOS  Skilled Therapeutic Interventions/Progress Updates:  Session 1 Skilled OT intervention completed with focus on functional ambulation and ADL retraining. Pt received seated in recliner, agreeable to session. 6/10 pain reported in Rt hip; pre-medicated. OT offered rest breaks, heat and repositioning throughout for pain reduction.  Deferred shower to PM session for family ed. Completed all sit > stands and ambulatory transfers with CGA using RW with improved RLE TDWB adherence this morning with fewer cues.   Ambulated about 12 ft > w/c to simulate needed distance for bathroom access at home. Able to complete oral care and UB dressing with set up A with ++ time due to LUE ROM deficits. Utilized reacher to thread pants over BLE, then CGA for sit > stand and dynamic balance while donning over hips. Total A needed for socks and shoes but able to use sock aid in previous sessions without difficulty.  Ambulated another 12 ft back to recliner. Pt remained seated in recliner with Rt hip off loaded by rolled towel and pillow, hot pack applied to Rt hip, with chair alarm on/activated, and with all needs in reach at end of session.  Session 2 Skilled OT intervention completed with focus on family education with granddaughter and husband present. Pt received seated in recliner, agreeable to session. No pain reported.  OT provided education on the following in prep for DC: -Recommended close supervision for all stand pivot transfers and sit > stands using RW, along with close supervision for ADLs due to frequency of cues needed for adherence to RLE precautions and occasional  LOB -Reviewed TDWB precautions on RLE, especially when dual tasking during functional tasks and with ambulation -Discussed AE such as long handled sponge, reacher, sock aid for LB care at home with handout issued for each -Confirmed bathroom set up, with roll in shower and all DME including built in seat and BSC already owned as reported by granddaughter -Energy conservation strategies- lukewarm water, proper ventilation via fan or cracked door to reduce steam, minimizing stands in shower especially during hair washing with eyes closed, and planning ahead for shower tasks to be rested/have rest time following in case of fatigue -Reviewed use of BSC at bedside to reduce fall risk at night when pt is less alert or for urgency -Reviewed use of BSC over top of toilet for raising the height or for use of push up rails to stand  OT assisted pt in demonstrating the following during session: -pt ambulated from recliner to > toilet about 20 ft in room with RW -min A to remove LB clothing, supervision for peri hygiene, then min A to stand from low toilet and CGA to ambulate to shower.   Granddaughter (who has history of working for home care) assisted with the following: -supervising pt for seated shower but no physical assist needed -assisting with drying pt off after shower -CGA stand pivot > w/c -setting pt up with clothes and assisting with sit > stands for LB clothing  Granddaughter and husband without further questions. Pt remained seated in w/c drying hair, awaiting upcoming PT session.   Therapy Documentation Precautions:  Precautions Precautions: Fall Precaution Comments: active cancer/chemo Restrictions  Weight Bearing Restrictions: Yes RLE Weight Bearing: Touchdown weight bearing    Therapy/Group: Individual Therapy  Melvyn Novas, MS, OTR/L  07/17/2023, 3:39 PM

## 2023-07-17 NOTE — Progress Notes (Signed)
PROGRESS NOTE   Subjective/Complaints: No acute events noted overnight.  Patient reports having some bad taste in her mouth at night.  She does she coughed a few times during this time . This has resolved this morning.  She feels like this is from late administration of medications and asked for medications to be adjusted earlier in the evening if possible.  She also requests Robaxin to be decreased to 500 mg dose.  She also asks her trazodone to be changed to as needed.   ROS: Patient denies fever, malaise, rash, sore throat, blurred vision, dizziness, nausea, vomiting, diarrhea, shortness of breath or chest pain,   headache, or mood change. + Left hip pain Insomnia-i improved Objective:   No results found. No results for input(s): "WBC", "HGB", "HCT", "PLT" in the last 72 hours.  No results for input(s): "NA", "K", "CL", "CO2", "GLUCOSE", "BUN", "CREATININE", "CALCIUM" in the last 72 hours.   Intake/Output Summary (Last 24 hours) at 07/17/2023 0807 Last data filed at 07/17/2023 0800 Gross per 24 hour  Intake 480 ml  Output 0 ml  Net 480 ml        Physical Exam: Vital Signs Blood pressure (!) 135/56, pulse 74, temperature 97.8 F (36.6 C), resp. rate 17, height 5\' 1"  (1.549 m), weight 60.5 kg, SpO2 99%. Constitutional: No distress . Vital signs reviewed.  Sitting up in bed. HEENT: NCAT, EOMI, oral membranes moist Neck: supple Cardiovascular: RRR without murmur. No JVD  .  Trace pitting edema bilateral lower extremities.   Respiratory/Chest: CTA Bilaterally without wheezes or rales.  Nonlabored breathing, on room air GI/Abdomen: BS +, non-tender, non-distended Ext: no clubbing, cyanosis Psych: pleasant and cooperative  Neuro: Awake, alert and awake, no apparent deficits. Strength:  Ue's 5-/5 B/L RLE-4/5 HF; KE 4+/5-improved. DF/PF 5-/5 LLE- HF 4/5; KE 4+/5; DF/PF 5-/5-unchanged  Skin: Surgical wound  stable.  MSK: Full active range of motion in hip internal and external rotation, without discomfort    Assessment/Plan: 1. Functional deficits which require 3+ hours per day of interdisciplinary therapy in a comprehensive inpatient rehab setting. Physiatrist is providing close team supervision and 24 hour management of active medical problems listed below. Physiatrist and rehab team continue to assess barriers to discharge/monitor patient progress toward functional and medical goals  Care Tool:  Bathing    Body parts bathed by patient: Right arm, Left arm, Chest, Abdomen, Right upper leg, Left upper leg, Face, Front perineal area, Buttocks, Right lower leg, Left lower leg   Body parts bathed by helper: Right lower leg, Left lower leg, Front perineal area, Buttocks     Bathing assist Assist Level: Supervision/Verbal cueing     Upper Body Dressing/Undressing Upper body dressing   What is the patient wearing?: Pull over shirt    Upper body assist Assist Level: Set up assist    Lower Body Dressing/Undressing Lower body dressing      What is the patient wearing?: Incontinence brief     Lower body assist Assist for lower body dressing: Contact Guard/Touching assist     Toileting Toileting    Toileting assist Assist for toileting: Minimal Assistance - Patient > 75%  Transfers Chair/bed transfer  Transfers assist     Chair/bed transfer assist level: Contact Guard/Touching assist     Locomotion Ambulation   Ambulation assist      Assist level: Contact Guard/Touching assist Assistive device: Walker-rolling Max distance: 81ft   Walk 10 feet activity   Assist     Assist level: Contact Guard/Touching assist Assistive device: Walker-rolling   Walk 50 feet activity   Assist Walk 50 feet with 2 turns activity did not occur: Safety/medical concerns (fatigue, pain, decreased adherance to RLE TDWB precautions)         Walk 150 feet  activity   Assist Walk 150 feet activity did not occur: Safety/medical concerns (fatigue, pain, decreased adherance to RLE TDWB precautions)         Walk 10 feet on uneven surface  activity   Assist Walk 10 feet on uneven surfaces activity did not occur: Safety/medical concerns (fatigue, pain, decreased adherance to RLE TDWB precautions)         Wheelchair     Assist Is the patient using a wheelchair?: Yes Type of Wheelchair: Manual    Wheelchair assist level: Supervision/Verbal cueing Max wheelchair distance: 154ft    Wheelchair 50 feet with 2 turns activity    Assist    Wheelchair 50 feet with 2 turns activity did not occur:  (fatigue)   Assist Level: Supervision/Verbal cueing   Wheelchair 150 feet activity     Assist  Wheelchair 150 feet activity did not occur:  (fatigue)   Assist Level: Supervision/Verbal cueing   Blood pressure (!) 135/56, pulse 74, temperature 97.8 F (36.6 C), resp. rate 17, height 5\' 1"  (1.549 m), weight 60.5 kg, SpO2 99%.   Medical Problem List and Plan: 1. Functional deficits secondary to right comminuted acetabular/right inferior pubic ramus fracture fracture.  Status post ORIF right acetabular fracture 07/03/2023.  Touchdown weightbearing.  No posterior hip precautions needed             -patient may  shower- cover incision             -ELOS/Goals: 10-12 days mod I to supervision  Grounds pass ordered  -Continue CIR therapies including PT, OT    -Patient requested nighttime medications to be given earlier in the day if possible, Senokot and docusate timing adjusted 2.  Antithrombotics: -DVT/anticoagulation:  Pharmaceutical: Eliquis             -antiplatelet therapy: N/A 3. Pain Management: Neurontin 600 mg 3 times daily, Robaxin 500 mg 4 times daily, hydrocodone as needed- will increase Robaxin to 750 mg q6H  -11/17 order kpad today. Likely mild hip flexor strain  11-24: Ordered extra Vicodin nightly as needed  11/25  Robaxin decreased to 500 mg per patient request  4. Mood/Behavior/Sleep: Valium 10 mg nightly as prior to admission.  Provide emotional support             -antipsychotic agents: N/A 5. Neuropsych/cognition: This patient is capable of making decisions on her own behalf. 6. Skin/Wound Care: Routine skin checks 7. Fluids/Electrolytes/Nutrition: Routine in and outs with follow-up chemistries 8.  Atrial fibrillation/flutter with RVR.  Followed by Dr. Dulce Sellar.  Toprol XL 50 mg nightly.Refused Cardizem 9.  Hypothyroidism.  Synthroid 10.  History of rosacea.  Patient maintained on chronic doxycycline 11.  Hyperlipidemia.  Crestor 12.  Stage IV breast cancer with mets to liver and lung.  Patient receives Trastuzumab infusion every 3 weeks followed by Dr. Gery Pray of oncology services.  Discussed that  infusion will have to be postponed to post-rehab 13.  Chronic left shoulder pain.  Patient did receive steroid injection 07/05/2023 per orthopedic services  -pain mgt as above 14.  History of urinary retention.  Maintained on Flomax as prior to admission.   -pt voiding continently without issue 15. Chronic constipation.  Senokot-S 2 tablets twice daily, Colace 100 mg twice daily, Dulcolax suppository as needed- magnesium gluconate 250mg  ordered HS  11/18: moved bowels all night, decrease senna docusate to 2 tabs HS  11/19: type 5 stool this morning, decrease colace to daily  Last bowel movement 11-23  16. Hyponatremia: encouraged salting foods  17. Suboptimal potassium: kdur ordered 11/15, ordered 11/18, check potassium today  18. Lower ext edema  -11/17 give dose of lasix today x 1  11-23: 20 mg as needed Lasix given, along with 40 mill equivalents potassium  11-24: Improved per patient.  19. Insomnia: increase gabapentin to 1200mg  HS as she takes at home, decrease trazodone to 25mg  HS  -Trazodone changed to as needed per patient request  20. GERD: d/c protonix since she  brought in her home omeprazole. Added tums with meals, decreased to prn since symtoms improved with omeprazole  -07/17/23 symptoms of bad taste in her mouth could be related to GERD, considering going up on Prilosec or switching back to Protonix if this continues, we will schedule Tums again  21. RLE cramping: VAS [place] ordered and discussed with patient that it is negative, calcium supplement added via tums prn  22. Hypocalcemia: added tums with meals, can wean if omeprazole helps with GERD, decrease to prn  23. Daytime somnolence: decrease trazodone to 25mg  HS, improved, continue this dose -No notable over this weekend  23. Hypotension: medications reviewed and nor on any medications purely for HTN -Vitals have remained stable, no symptomatic orthostasis -11/25 controlled continue to monitor     07/17/2023    3:33 AM 07/16/2023    7:37 PM 07/16/2023    3:37 PM  Vitals with BMI  Systolic 135 117 409  Diastolic 56 54 59  Pulse 74 70 67     LOS: 11 days A FACE TO FACE EVALUATION WAS PERFORMED  Fanny Dance 07/17/2023, 8:07 AM

## 2023-07-18 ENCOUNTER — Other Ambulatory Visit (HOSPITAL_COMMUNITY): Payer: Self-pay

## 2023-07-18 ENCOUNTER — Inpatient Hospital Stay (HOSPITAL_COMMUNITY): Payer: Medicare Other

## 2023-07-18 ENCOUNTER — Encounter: Payer: Self-pay | Admitting: Oncology

## 2023-07-18 DIAGNOSIS — E876 Hypokalemia: Secondary | ICD-10-CM

## 2023-07-18 LAB — BASIC METABOLIC PANEL
Anion gap: 10 (ref 5–15)
BUN: 9 mg/dL (ref 8–23)
CO2: 24 mmol/L (ref 22–32)
Calcium: 8.5 mg/dL — ABNORMAL LOW (ref 8.9–10.3)
Chloride: 100 mmol/L (ref 98–111)
Creatinine, Ser: 0.66 mg/dL (ref 0.44–1.00)
GFR, Estimated: >60 mL/min (ref >60)
Glucose, Bld: 171 mg/dL — ABNORMAL HIGH (ref 70–99)
Potassium: 4.2 mmol/L (ref 3.5–5.1)
Sodium: 134 mmol/L — ABNORMAL LOW (ref 135–145)

## 2023-07-18 LAB — CBC
HCT: 33.1 % — ABNORMAL LOW (ref 36.0–46.0)
Hemoglobin: 10.4 g/dL — ABNORMAL LOW (ref 12.0–15.0)
MCH: 30.8 pg (ref 26.0–34.0)
MCHC: 31.4 g/dL (ref 30.0–36.0)
MCV: 97.9 fL (ref 80.0–100.0)
Platelets: 318 10*3/uL (ref 150–400)
RBC: 3.38 MIL/uL — ABNORMAL LOW (ref 3.87–5.11)
RDW: 13.6 % (ref 11.5–15.5)
WBC: 7.3 10*3/uL (ref 4.0–10.5)
nRBC: 0 % (ref 0.0–0.2)

## 2023-07-18 MED ORDER — DIAZEPAM 5 MG PO TABS
10.0000 mg | ORAL_TABLET | Freq: Every day | ORAL | 0 refills | Status: DC
  Filled 2023-07-18: qty 30, 15d supply, fill #0

## 2023-07-18 MED ORDER — TRAZODONE HCL 50 MG PO TABS
25.0000 mg | ORAL_TABLET | Freq: Every evening | ORAL | 0 refills | Status: DC | PRN
  Filled 2023-07-18: qty 30, 60d supply, fill #0

## 2023-07-18 MED ORDER — CALCIUM CARBONATE ANTACID 500 MG PO CHEW: 1.0000 | CHEWABLE_TABLET | Freq: Three times a day (TID) | ORAL | Status: DC

## 2023-07-18 MED ORDER — GABAPENTIN 300 MG PO CAPS
ORAL_CAPSULE | ORAL | 0 refills | Status: DC
  Filled 2023-07-18: qty 180, 23d supply, fill #0

## 2023-07-18 MED ORDER — DOCUSATE SODIUM 100 MG PO CAPS: 200.0000 mg | ORAL_CAPSULE | Freq: Every day | ORAL | Status: AC

## 2023-07-18 MED ORDER — OMEPRAZOLE 20 MG PO CPDR
20.0000 mg | DELAYED_RELEASE_CAPSULE | Freq: Every day | ORAL | 0 refills | Status: AC
  Filled 2023-07-18: qty 30, 30d supply, fill #0

## 2023-07-18 MED ORDER — HYDROCODONE-ACETAMINOPHEN 5-325 MG PO TABS
1.0000 | ORAL_TABLET | Freq: Four times a day (QID) | ORAL | 0 refills | Status: DC | PRN
  Filled 2023-07-18: qty 30, 4d supply, fill #0

## 2023-07-18 MED ORDER — METHOCARBAMOL 500 MG PO TABS
500.0000 mg | ORAL_TABLET | Freq: Four times a day (QID) | ORAL | 0 refills | Status: AC
  Filled 2023-07-18: qty 120, 30d supply, fill #0

## 2023-07-18 MED ORDER — TAMSULOSIN HCL 0.4 MG PO CAPS
0.4000 mg | ORAL_CAPSULE | Freq: Every evening | ORAL | 0 refills | Status: AC
  Filled 2023-07-18: qty 30, 30d supply, fill #0

## 2023-07-18 MED ORDER — APIXABAN 2.5 MG PO TABS
2.5000 mg | ORAL_TABLET | Freq: Two times a day (BID) | ORAL | 0 refills | Status: DC
  Filled 2023-07-18: qty 60, 30d supply, fill #0

## 2023-07-18 MED ORDER — METOPROLOL SUCCINATE ER 50 MG PO TB24
50.0000 mg | ORAL_TABLET | Freq: Every day | ORAL | 0 refills | Status: DC
  Filled 2023-07-18: qty 30, 30d supply, fill #0

## 2023-07-18 MED ORDER — VITAMIN D 25 MCG (1000 UNIT) PO TABS
1000.0000 [IU] | ORAL_TABLET | Freq: Every day | ORAL | 0 refills | Status: AC
  Filled 2023-07-18: qty 30, 30d supply, fill #0

## 2023-07-18 MED ORDER — LEVOTHYROXINE SODIUM 50 MCG PO TABS
50.0000 ug | ORAL_TABLET | Freq: Every day | ORAL | 0 refills | Status: AC
  Filled 2023-07-18: qty 30, 30d supply, fill #0

## 2023-07-18 NOTE — Plan of Care (Signed)
Problem: RH Balance Goal: LTG Patient will maintain dynamic standing with ADLs (OT) Description: LTG:  Patient will maintain dynamic standing balance with assist during activities of daily living (OT)  Outcome: Completed/Met   Problem: Sit to Stand Goal: LTG:  Patient will perform sit to stand in prep for activites of daily living with assistance level (OT) Description: LTG:  Patient will perform sit to stand in prep for activites of daily living with assistance level (OT) Outcome: Completed/Met   Problem: RH Bathing Goal: LTG Patient will bathe all body parts with assist levels (OT) Description: LTG: Patient will bathe all body parts with assist levels (OT) Outcome: Completed/Met   Problem: RH Dressing Goal: LTG Patient will perform upper body dressing (OT) Description: LTG Patient will perform upper body dressing with assist, with/without cues (OT). Outcome: Completed/Met Goal: LTG Patient will perform lower body dressing w/assist (OT) Description: LTG: Patient will perform lower body dressing with assist, with/without cues in positioning using equipment (OT) Outcome: Completed/Met   Problem: RH Toileting Goal: LTG Patient will perform toileting task (3/3 steps) with assistance level (OT) Description: LTG: Patient will perform toileting task (3/3 steps) with assistance level (OT)  Outcome: Completed/Met   Problem: RH Functional Use of Upper Extremity Goal: LTG Patient will use RT/LT upper extremity as a (OT) Description: LTG: Patient will use right/left upper extremity as a stabilizer/gross assist/diminished/nondominant/dominant level with assist, with/without cues during functional activity (OT) Outcome: Completed/Met   Problem: RH Simple Meal Prep Goal: LTG Patient will perform simple meal prep w/assist (OT) Description: LTG: Patient will perform simple meal prep with assistance, with/without cues (OT). Outcome: Completed/Met   Problem: RH Toilet Transfers Goal: LTG  Patient will perform toilet transfers w/assist (OT) Description: LTG: Patient will perform toilet transfers with assist, with/without cues using equipment (OT) Outcome: Completed/Met     Problem: RH Tub/Shower Transfers Goal: LTG Patient will perform tub/shower transfers w/assist (OT) Description: LTG: Patient will perform tub/shower transfers with assist, with/without cues using equipment (OT) Outcome: Completed/Met

## 2023-07-18 NOTE — Progress Notes (Signed)
Patient ID: Karen Allison, female   DOB: Mar 04, 1946, 77 y.o.   MRN: [id number]  Patient has all DME. HH established with [name].

## 2023-07-18 NOTE — Progress Notes (Signed)
Physical Therapy Discharge Summary  Patient Details  Name: Karen Allison MRN: [id number] Date of Birth: 01/10/1946  Date of Discharge from PT service:July 18, 2023  Patient has met 7 of 9 long term goals due to improved activity tolerance, improved balance, improved postural control, increased strength, increased range of motion, decreased pain, ability to compensate for deficits, improved attention, improved awareness, and improved coordination.  Patient to discharge at a wheelchair level Supervision using RW. Patient's care partner is independent to provide the necessary physical assistance at discharge. Pt's family attended family education training on 11/25 and verbalized and demonstrated confidence with all tasks to ensure safe discharge home.   Reasons goals not met: Pt did not meet ambulation goals of 71ft and 170ft as pt is currently only able to ambulate up to 15ft with RW and supervision due to difficulty adhering to RLE TDWB precautions from global weakness/deconditioning, pain, and decreased balance.   Recommendation:  Patient will benefit from ongoing skilled PT services in home health setting to continue to advance safe functional mobility, address ongoing impairments in transfers, generalized strengthening and endurance, dynamic standing balance/coordination, gait training, NMR, and to minimize fall risk.  Equipment: No equipment provided -pt already has RW and WC  Reasons for discharge: treatment goals met  Patient/family agrees with progress made and goals achieved: Yes  PT Discharge Precautions/Restrictions Precautions Precautions: Fall Precaution Comments: active cancer/chemo Restrictions Weight Bearing Restrictions: Yes RLE Weight Bearing: Touchdown weight bearing Pain Interference Pain Interference Pain Effect on Sleep: 2. Occasionally Pain Interference with Therapy Activities: 1. Rarely or not at all Pain Interference with Day-to-Day Activities: 1.  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 and numbness/tingling along posterior thigh/knee region Coordination Gross Motor Movements are Fluid and Coordinated: No Fine Motor Movements are Fluid and Coordinated: Yes Coordination and Movement Description: altered balance strategies due to RLE TDWB precautions Finger Nose Finger Test: [facility] bilaterally Heel Shin Test: unable to perform on RLE, WFL on LLE Motor  Motor Motor: Other (comment) Motor - Skilled Clinical Observations: altered balance strategies due to RLE TDWB precautions  Mobility Bed Mobility Bed Mobility: Rolling Right;Rolling Left;Sit to Supine;Supine to Sit Rolling Right: Independent with assistive device Rolling Left: Independent with assistive device Supine to Sit: Independent with assistive device Sit to Supine: Independent with assistive device Transfers Transfers: Sit to Stand;Stand to Sit;Stand Pivot Transfers Sit to Stand: Supervision/Verbal cueing Stand to Sit: Supervision/Verbal cueing Stand Pivot Transfers: Supervision/Verbal cueing Stand Pivot Transfer Details (indicate cue type and reason): occasional verbal cues for TDWB precautions Transfer (Assistive device): Rolling walker Locomotion  Gait Ambulation: Yes Gait Assistance: Supervision/Verbal cueing Gait Distance (Feet): 20 Feet Assistive device: Rolling walker Gait Assistance Details: Verbal cues for technique;Verbal cues for gait pattern Gait Assistance Details: occasional verbal cues for  adherance to RLE TDWB precautions Gait Gait: Yes Gait Pattern: Impaired Gait Pattern: Step-to pattern;Decreased step length - right;Decreased step length - left;Decreased stance time - right;Decreased stride length;Poor foot clearance - right;Poor foot clearance - left;Narrow base of support;Antalgic Gait velocity: Decreased Stairs / Additional Locomotion Stairs: No Ramp: Contact Guard/touching assist (RW) Naval architect Mobility: Yes Wheelchair Assistance: Independent with Scientist, research (life sciences): Both  upper extremities Wheelchair Parts Management: Needs assistance Distance: 166ft  Trunk/Postural Assessment  Cervical Assessment Cervical Assessment: Within Functional Limits Thoracic Assessment Thoracic Assessment: Within Functional Limits Lumbar Assessment Lumbar Assessment: Within Functional Limits Postural Control Postural Control: Deficits on evaluation Righting Reactions: delayed on R Protective Responses: delayed on R  Balance Balance Balance Assessed: Yes Static Sitting Balance Static Sitting - Balance Support: Feet supported;No upper extremity supported Static Sitting - Level of Assistance: 7: Independent Dynamic Sitting Balance Dynamic Sitting - Balance Support: No upper extremity supported;Feet supported Dynamic Sitting - Level of Assistance: 6: Modified independent (Device/Increase time) Static Standing Balance Static Standing - Balance Support: Bilateral upper extremity supported;During functional activity (RW) Static Standing - Level of Assistance: 5: Stand by assistance (supervision) Dynamic Standing Balance Dynamic Standing - Balance Support: Bilateral upper extremity supported;During functional activity (RW) Dynamic Standing - Level of Assistance: 5: Stand by assistance (supervision) Dynamic Standing - Comments: with transfers and gait Extremity Assessment  RLE Assessment RLE Assessment: Exceptions to [facility] General Strength  Comments: tested sitting in recliner RLE Strength Right Hip Flexion: 3-/5 Right Hip ABduction: 3+/5 Right Hip ADduction: 3+/5 Right Knee Flexion: 3+/5 Right Knee Extension: 3+/5 Right Ankle Dorsiflexion: 3+/5 LLE Assessment LLE Assessment: Exceptions to [facility] General Strength Comments: tested sitting in recliner LLE Strength Left Hip Flexion: 4-/5 Left Hip ABduction: 4-/5 Left Hip ADduction: 4-/5 Left Knee Flexion: 4-/5 Left Knee Extension: 4-/5 Left Ankle Dorsiflexion: 4/5 Left Ankle Plantar Flexion: 4/5   Marlana Salvage Zaunegger Blima Rich PT, DPT 07/18/2023, 7:06 AM

## 2023-07-18 NOTE — Progress Notes (Signed)
Occupational Therapy Discharge Summary  Patient Details  Name: Karen Allison MRN: [id number] Date of Birth: 1946/05/26  Date of Discharge from OT service:July 18, 2023   Patient has met 10 of 10 long term goals due to improved activity tolerance, improved balance, ability to compensate for deficits, functional use of  RIGHT lower and LEFT upper extremity, improved awareness, and improved coordination.  Patient to discharge at overall Supervision level.  Patient's care partner is independent to provide the necessary physical assistance at discharge. Pt's granddaughter and husband completed training/education prior to DC regarding functional transfer and ADL recommendations and verbalized their readiness to assist pt at their CLOF.   All goals met  Recommendation:  Patient will benefit from ongoing skilled OT services in home health setting to continue to advance functional skills in the area of BADL, iADL, and Reduce care partner burden.  Equipment: No equipment provided  Reasons for discharge: treatment goals met  Patient/family agrees with progress made and goals achieved: Yes  OT Discharge Precautions/Restrictions  Precautions Precautions: Fall Precaution Comments: active cancer/chemo Restrictions Weight Bearing Restrictions: Yes RLE Weight Bearing: Touchdown weight bearing ADL ADL Equipment Provided: Long-handled sponge Eating: Independent Where Assessed-Eating: Chair Grooming: Independent Where Assessed-Grooming: Sitting at sink Upper Body Bathing: Modified independent Where Assessed-Upper Body Bathing: Shower Lower Body Bathing: Setup Where Assessed-Lower Body Bathing: Shower Upper Body Dressing: Modified independent (Device) Where Assessed-Upper Body Dressing: Wheelchair Lower Body Dressing: Supervision/safety Where Assessed-Lower Body Dressing: Sitting at sink, Standing at sink Toileting: Supervision/safety Where Assessed-Toileting: Bedside Commode,  Actuary Transfer: Close supervision Toilet Transfer Method: Proofreader: Bedside commode, Grab bars Tub/Shower Transfer: Not assessed Tub/Shower Transfer Method: Unable to assess Film/video editor: Close supervision Film/video editor Method: Designer, industrial/product: Emergency planning/management officer, Grab bars Vision Baseline Vision/History: 1 Wears glasses;4 Cataracts Patient Visual Report: No change from baseline Vision Assessment?: No apparent visual deficits Perception  Perception: Within Functional Limits Praxis Praxis: WFL Cognition Cognition Overall Cognitive Status: Within Functional Limits for tasks assessed Arousal/Alertness: Awake/alert Orientation Level: Person;Place;Situation Person: Oriented Place: Oriented Situation: Oriented Memory: Appears intact Awareness: Appears intact Problem Solving: Appears intact Safety/Judgment: Appears intact Brief Interview for Mental Status (BIMS) Repetition of Three Words (First Attempt): 3 Temporal Orientation: Year: Correct Temporal Orientation: Month: Accurate within 5 days Temporal Orientation: Day: Correct Recall: "Sock": Yes, no cue required Recall: "Blue": Yes, no cue required Recall: "Bed": Yes, no cue required BIMS Summary Score: 15 Sensation Sensation Light Touch: Impaired Detail Light Touch Impaired Details: Impaired RLE Hot/Cold: Appears Intact Proprioception: Appears Intact Stereognosis: Not tested Additional Comments: 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 and numbness/tingling along posterior thigh/knee region Coordination Gross Motor Movements are Fluid and Coordinated: No Fine Motor Movements are Fluid and Coordinated: Yes Coordination and Movement Description: altered balance strategies due to RLE TDWB precautions Finger Nose Finger Test: [facility] bilaterally Motor  Motor Motor: Other (comment) Motor - Skilled Clinical Observations: altered balance strategies due to RLE TDWB  precautions Mobility  Bed Mobility Bed Mobility: Rolling Right;Rolling Left;Sit to Supine;Supine to Sit Rolling Right: Independent with assistive device Rolling Left: Independent with assistive device Supine to Sit: Independent with assistive device Sit to Supine: Independent with assistive device Transfers Sit to Stand: Supervision/Verbal cueing Stand to Sit: Supervision/Verbal cueing  Trunk/Postural Assessment  Cervical Assessment Cervical Assessment: Within Functional Limits Thoracic Assessment Thoracic Assessment: Within Functional Limits Lumbar Assessment Lumbar Assessment: Within Functional Limits Postural Control Postural Control: Deficits on evaluation Righting Reactions: delayed on R Protective Responses: delayed  on R  Balance Balance Balance Assessed: Yes Static Sitting Balance Static Sitting - Balance Support: Feet supported;No upper extremity supported Static Sitting - Level of Assistance: 7: Independent Dynamic Sitting Balance Dynamic Sitting - Balance Support: No upper extremity supported;Feet supported Dynamic Sitting - Level of Assistance: 6: Modified independent (Device/Increase time) Static Standing Balance Static Standing - Balance Support: Bilateral upper extremity supported;During functional activity (RW) Static Standing - Level of Assistance: 5: Stand by assistance (supervision) Dynamic Standing Balance Dynamic Standing - Balance Support: Bilateral upper extremity supported;During functional activity (RW) Dynamic Standing - Level of Assistance: 5: Stand by assistance (supervision) Dynamic Standing - Comments: with transfers and gait Extremity/Trunk Assessment RUE Assessment RUE Assessment: Exceptions to [facility] General Strength Comments: 3+/5 grossly LUE Assessment LUE Assessment: Exceptions to [facility] Active Range of Motion (AROM) Comments: Shouler limited to < 45 degrees SF from fall 4 years ago; non-operative General Strength Comments: 2-/5 proximally,  3+/5 distally   Karen [place] [name], MS, OTR/L  07/18/2023, 3:43 PM

## 2023-07-18 NOTE — Progress Notes (Signed)
PROGRESS NOTE   Subjective/Complaints: No acute events overnight noted.  She is happy with medication changes yesterday regarding decreasing Robaxin dose and medication timing adjustments.  She did not have any issues waking up last night with a bad taste in her mouth.   ROS: Patient denies fever, malaise, rash, sore throat, blurred vision, dizziness, nausea, vomiting, diarrhea, shortness of breath or chest pain,   headache, or mood change. + Left hip pain Insomnia-i improved Cough and bad taste in mouth-improved Objective:   No results found. No results for input(s): "WBC", "HGB", "HCT", "PLT" in the last 72 hours.  No results for input(s): "NA", "K", "CL", "CO2", "GLUCOSE", "BUN", "CREATININE", "CALCIUM" in the last 72 hours.   Intake/Output Summary (Last 24 hours) at 07/18/2023 1408 Last data filed at 07/18/2023 0730 Gross per 24 hour  Intake 960 ml  Output --  Net 960 ml        Physical Exam: Vital Signs Blood pressure (!) 122/57, pulse 80, temperature (!) 97.5 F (36.4 C), resp. rate 17, height 5\' 1"  (1.549 m), weight 60.5 kg, SpO2 97%. Constitutional: No distress . Vital signs reviewed.  Sitting up in bed. HEENT: NCAT, EOMI, oral membranes moist Neck: supple Cardiovascular: RRR without murmur. No JVD  .  Trace pitting edema bilateral lower extremities.   Respiratory/Chest: CTA Bilaterally without wheezes or rales.  Nonlabored breathing, on room air GI/Abdomen: BS +, non-tender, non-distended Ext: no clubbing, cyanosis Psych: pleasant and cooperative  Neuro: Awake, alert and awake, no apparent deficits. Strength:  Ue's 5-/5 B/L RLE-4/5 HF; KE 4+/5-improved. DF/PF 5-/5 LLE- HF 4/5; KE 4+/5; DF/PF 5-/5-unchanged  Skin: Surgical wound stable.  MSK: As expected mild tenderness around surgical incisions and mild tenderness in the thigh on the right    Assessment/Plan: 1. Functional deficits which require 3+  hours per day of interdisciplinary therapy in a comprehensive inpatient rehab setting. Physiatrist is providing close team supervision and 24 hour management of active medical problems listed below. Physiatrist and rehab team continue to assess barriers to discharge/monitor patient progress toward functional and medical goals  Care Tool:  Bathing    Body parts bathed by patient: Right arm, Left arm, Chest, Abdomen, Right upper leg, Left upper leg, Face, Front perineal area, Buttocks, Right lower leg, Left lower leg   Body parts bathed by helper: Right lower leg, Left lower leg, Front perineal area, Buttocks     Bathing assist Assist Level: Set up assist     Upper Body Dressing/Undressing Upper body dressing   What is the patient wearing?: Pull over shirt    Upper body assist Assist Level: Independent with assistive device    Lower Body Dressing/Undressing Lower body dressing      What is the patient wearing?: Incontinence brief, Pants     Lower body assist Assist for lower body dressing: Supervision/Verbal cueing     Toileting Toileting    Toileting assist Assist for toileting: Supervision/Verbal cueing     Transfers Chair/bed transfer  Transfers assist     Chair/bed transfer assist level: Supervision/Verbal cueing     Locomotion Ambulation   Ambulation assist      Assist level: Supervision/Verbal cueing Assistive  device: Walker-rolling Max distance: 11ft   Walk 10 feet activity   Assist     Assist level: Supervision/Verbal cueing Assistive device: Walker-rolling   Walk 50 feet activity   Assist Walk 50 feet with 2 turns activity did not occur: Safety/medical concerns (fatigue, weakness, pain, decreased balance)         Walk 150 feet activity   Assist Walk 150 feet activity did not occur: Safety/medical concerns (fatigue, weakness, pain, decreased balance)         Walk 10 feet on uneven surface  activity   Assist Walk 10 feet  on uneven surfaces activity did not occur: Safety/medical concerns (fatigue, pain, decreased adherance to RLE TDWB precautions)   Assist level: Contact Guard/Touching assist Assistive device: Walker-rolling   Wheelchair     Assist Is the patient using a wheelchair?: Yes Type of Wheelchair: Manual    Wheelchair assist level: Independent Max wheelchair distance: 121ft    Wheelchair 50 feet with 2 turns activity    Assist    Wheelchair 50 feet with 2 turns activity did not occur:  (fatigue)   Assist Level: Independent   Wheelchair 150 feet activity     Assist  Wheelchair 150 feet activity did not occur:  (fatigue)   Assist Level: Independent   Blood pressure (!) 122/57, pulse 80, temperature (!) 97.5 F (36.4 C), resp. rate 17, height 5\' 1"  (1.549 m), weight 60.5 kg, SpO2 97%.   Medical Problem List and Plan: 1. Functional deficits secondary to right comminuted acetabular/right inferior pubic ramus fracture fracture.  Status post ORIF right acetabular fracture 07/03/2023.  Touchdown weightbearing.  No posterior hip precautions needed             -patient may  shower- cover incision             -ELOS/Goals: 10-12 days mod I to supervision  Grounds pass ordered  -Continue CIR therapies including PT, OT    -Patient requested nighttime medications to be given earlier in the day if possible, Senokot and docusate timing adjusted-patient reports she did better with this medication change  -Plan for discharge home tomorrow 2.  Antithrombotics: -DVT/anticoagulation:  Pharmaceutical: Eliquis             -antiplatelet therapy: N/A 3. Pain Management: Neurontin 600 mg 3 times daily, Robaxin 500 mg 4 times daily, hydrocodone as needed- will increase Robaxin to 750 mg q6H  -11/17 order kpad today. Likely mild hip flexor strain  11-24: Ordered extra Vicodin nightly as needed  11/25 Robaxin decreased to 500 mg per patient request-patient reports this is working well for  her  4. Mood/Behavior/Sleep: Valium 10 mg nightly as prior to admission.  Provide emotional support             -antipsychotic agents: N/A 5. Neuropsych/cognition: This patient is capable of making decisions on her own behalf. 6. Skin/Wound Care: Routine skin checks 7. Fluids/Electrolytes/Nutrition: Routine in and outs with follow-up chemistries 8.  Atrial fibrillation/flutter with RVR.  Followed by Dr. Dulce Sellar.  Toprol XL 50 mg nightly.Refused Cardizem 9.  Hypothyroidism.  Synthroid 10.  History of rosacea.  Patient maintained on chronic doxycycline 11.  Hyperlipidemia.  Crestor 12.  Stage IV breast cancer with mets to liver and lung.  Patient receives Trastuzumab infusion every 3 weeks followed by Dr. Gery Pray of oncology services.  Discussed that infusion will have to be postponed to post-rehab 13.  Chronic left shoulder pain.  Patient did receive steroid injection 07/05/2023 per  orthopedic services  -pain mgt as above 14.  History of urinary retention.  Maintained on Flomax as prior to admission.   -pt voiding continently without issue 15. Chronic constipation.  Senokot-S 2 tablets twice daily, Colace 100 mg twice daily, Dulcolax suppository as needed- magnesium gluconate 250mg  ordered HS  11/18: moved bowels all night, decrease senna docusate to 2 tabs HS  11/19: type 5 stool this morning, decrease colace to daily  Last bowel movement 11-25-improved  16. Hyponatremia: encouraged salting foods  17. Suboptimal potassium: kdur ordered 11/15, ordered 11/18, check potassium today  -recheck to ensure levels stable  18. Lower ext edema  -11/17 give dose of lasix today x 1  11-23: 20 mg as needed Lasix given, along with 40 mill equivalents potassium  11-24: Improved per patient.  19. Insomnia: increase gabapentin to 1200mg  HS as she takes at home, decrease trazodone to 25mg  HS  -Trazodone changed to as needed per patient request  20. GERD: d/c protonix since she  brought in her home omeprazole. Added tums with meals, decreased to prn since symtoms improved with omeprazole  -07/17/23 symptoms of bad taste in her mouth could be related to GERD, considering going up on Prilosec or switching back to Protonix if this continues, we will schedule Tums again  -11/26 symptoms improved today  21. RLE cramping: VAS [place] ordered and discussed with patient that it is negative, calcium supplement added via tums prn  22. Hypocalcemia: added tums with meals, can wean if omeprazole helps with GERD, decrease to prn  23. Daytime somnolence: decrease trazodone to 25mg  HS, improved, continue this dose -No notable over this weekend  23. Hypotension: medications reviewed and nor on any medications purely for HTN -Vitals have remained stable, no symptomatic orthostasis -11/26 controlled continue to monitor     07/18/2023    5:10 AM 07/17/2023   10:06 PM 07/17/2023    7:47 PM  Vitals with BMI  Systolic 122 118 161  Diastolic 57 55 55  Pulse 80 80 80     LOS: 12 days A FACE TO FACE EVALUATION WAS PERFORMED  Fanny Dance 07/18/2023, 2:08 PM

## 2023-07-18 NOTE — Progress Notes (Signed)
Physical Therapy Session Note  Patient Details  Name: Karen Allison MRN: [id number] Date of Birth: Jan 13, 1946  Today's Date: 07/18/2023 PT Individual Time: 4098-1191 and 1301-1356 PT Individual Time Calculation (min): 55 min and 55 min  Short Term Goals: Week 1:  PT Short Term Goal 1 (Week 1): pt will transfer sit<>stand with LRAD and CGA PT Short Term Goal 1 - Progress (Week 1): Met PT Short Term Goal 2 (Week 1): pt will ambulate 62ft with LRAD and CGA PT Short Term Goal 2 - Progress (Week 1): Progressing toward goal PT Short Term Goal 3 (Week 1): pt will perform bed mobility with CGA PT Short Term Goal 3 - Progress (Week 1): Met Week 2:  PT Short Term Goal 1 (Week 2): STG=LTG due to LOS  Skilled Therapeutic Interventions/Progress Updates:   Treatment Session 1 Received pt sitting in recliner, pt agreeable to PT treatment, and reported pain 5/10 in RLE (premedicated). Session with emphasis on discharge planning, functional mobility/transfers, generalized strengthening and endurance, toileting, and gait training. Went through sensation, MMT, and pain interference questionnaire in preparation for discharge.   Donned L sock and shoe with max A and pt performed all transfers with RW and supervision throughout session. Pt performed WC mobility 167ft using BUE and mod I to dayroom with emphasis on UE strength. Pt then ambulated 61ft with RW and close supervision - noted pt placing almost full weight onto RLE and provided cues/reminders for TDWB precautions and technique to use. Transferred onto mat and stood and picked up object from floor using reacher and mod I. Pt reported urge to toilet and returned to room. Pt transferred on/off toilet with RW and supervision. Pt able to manage clothing standing with min A overall and continent of bowel and bladder - performed hygiene management without assist. Pt sat in WC at sink and performed hand hygiene without assist. Pt transferred to bed and  transferred sit<>supine mod I. Concluded session with pt semi-reclined in bed, needs within reach, and bed alarm on awaiting x-ray arrival.   Treatment Session 2 Received pt sitting upright in bed, pt agreeable to PT treatment, and reported pain 6/10 in R hip - RN notified and present to administer pain medication. Session with emphasis on functional mobility/transfers, toileting, generalized strengthening and endurance, and gait training. Pt transferred semi-reclined<>sitting R EOB with HOB elevated and mod I. Donned R non-skid sock and L shoe with max A. Pt reported urge to void and transferred bed<>WC<>toilet<>WC with RW and close supervision. Pt able to manage clothing, void, and perform hygiene management without assist. Sat in WC at sink and washed hands independently.   Pt transported to/from room in [facility] dependently for time management purposes. Pt stood from [facility] with RW and supervision and ambulated 88ft with RW and close supervision with min cues for adherence to RLE TDWB precautions. Pt then performed the following exercises with emphasis on BLE strength/ROM: - R hip flexion 2x10 - R hip abduction 2x10 - R hip extension 2x10  - L heel raises 2x10 Transitioned to R toe taps to cone 2x10 with emphasis on hip flexor strengthening. Transferred back into WC with RW and supervision and returned to room. Transferred into recliner with RW and supervision and concluded session with pt sitting in recliner with all needs within reach.   Therapy Documentation Precautions:  Precautions Precautions: Fall Precaution Comments: active cancer/chemo Restrictions Weight Bearing Restrictions: Yes RLE Weight Bearing: Touchdown weight bearing  Therapy/Group: Individual Therapy Marlana Salvage Zaunegger Tobi Bastos Zaunegger  PT, DPT 07/18/2023, 7:04 AM

## 2023-07-18 NOTE — Progress Notes (Signed)
Orthopaedic Trauma Progress Note  SUBJECTIVE: Patient doing well. Pain controlled. Therapies going well. Is excited about discharge home tomorrow.   OBJECTIVE:  Vitals:   07/17/23 2206 07/18/23 0510  BP: (!) 118/55 (!) 122/57  Pulse: 80 80  Resp:  17  Temp:  (!) 97.5 F (36.4 C)  SpO2:  97%   General: Sitting up in bedside chair, no acute distress Respiratory: No increased work of breathing.  Pelvis/right extremity: pelvic incision is clean, dry, intact.  Tenderness around the incision as expected.  No significant tenderness over the lateral hip.  Some baseline soreness throughout the thigh and calf.  Ankle DF/PF intact.  Toes warm and well-perfused.  Equal to contralateral side.  Able to wiggle toes.  Endorses sensation light touch over all aspects of the foot. + DP pulse  IMAGING: Order repeat imaging R acetabulum today  LABS:  No results found for this or any previous visit (from the past 24 hour(s)).   ASSESSMENT: Karen Allison is a 77 y.o. female s/p OPEN REDUCTION INTERNAL FIXATION RIGHT ACETABULAR FRACTURE STOPPA on 07/03/23   PLAN: Weightbearing: TDWB RLE ROM: Unrestricted hip motion.  No posterior hip precautions needed. Incisional and dressing care: Ok to leave incision open to air Showering: Okay to shower/get incisions wet Orthopedic device(s): None  Pain management: continue current regimen VTE prophylaxis: Eliquis, SCDs Impediments to Fracture Healing: Vitamin D level low normal at 33, continued on home dose supplementation of 1000 units daily Dispo: Continue care per CIR  Follow - up plan: 2 weeks after d/c for wound check and repeat x-rays   Contact information:  Truitt Merle MD, Thyra Breed PA-C. After hours and holidays please check [email or web address] for group call information for [facility]   Thompson Caul, PA-C [phone] (office) [email or web address]

## 2023-07-18 NOTE — Progress Notes (Signed)
Occupational Therapy Session Note  Patient Details  Name: Karen Allison MRN: [id number] Date of Birth: [birth year]  Today's Date: 07/18/2023 OT Individual Time: 1430-1530 OT Individual Time Calculation (min): 60 min  and Today's Date: 07/18/2023 OT Missed Time: 15 Minutes Missed Time Reason: Other (comment) (labs)   Short Term Goals: Week 2:  OT Short Term Goal 1 (Week 2): STG = LTG due to ELOS  Skilled Therapeutic Interventions/Progress Updates:  Skilled OT intervention completed with focus on ambulatory transfers, RLE pain management, RLE/LUE ROM. Pt received seated in recliner, nursing present for initially for labs, therefore pt missed 15 mins of OT intervention; OT will make up missed time as able.  Pt agreeable to session. Unrated Rt hip and knee pain reported along with fatigue; pre-medicated. OT offered heat, gentle stretches, rest breaks and repositioning throughout for pain reduction.  Completed all sit > stands and ambulatory transfers with close supervision using RW with good adherence to TDWB on RLE only. Ambulated > w/c about 10 ft. Transported dependently in w/c <> gym for pain management. Ambulatory transfer > EOM, then supervision to transition into supine position on mat with wedge/pillow/bolster offered for comfort. Hot pack applied to Rt knee (anterior and posterior) in prep for ROM/stretching; removed after 20 mins with skin intact.  In supine, pt completed the following AAROM exercises with 1 pound dowel to promote improved joint mobility in a pain free zone needed for UB dressing: -scapular protraction x15 -horizontal shoulder abduction x15 -shoulder flexion x15  Pt completed the following RLE exercises to promote ROM needed for functional mobility and LB care with pillow case applied under Rt foot to reduce friction: -SAQ x15 while on bolster -knee extension with x15 with 5 sec hold -heel slides x15 -hip abduction slides x5 with off loading assist  needed  Transitioned to EOM with min A via HHA for trunk elevation, then ambulatory transfer back to w/c. Back in room, ambulated > EOB and transitioned with mod I into supine in bed. Pt remained semi supine in bed, with bed alarm on/activated, and with all needs in reach at end of session.   Therapy Documentation Precautions:  Precautions Precautions: Fall Precaution Comments: active cancer/chemo Restrictions Weight Bearing Restrictions: Yes RLE Weight Bearing: Touchdown weight bearing    Therapy/Group: Individual Therapy  Melvyn Novas, MS, OTR/L  07/18/2023, 3:42 PM

## 2023-07-18 NOTE — Progress Notes (Addendum)
Inpatient Rehabilitation Discharge Medication Review by a Pharmacist  A complete drug regimen review was completed for this patient to identify any potential clinically significant medication issues.  High Risk Drug Classes Is patient taking? Indication by Medication  Antipsychotic No   Anticoagulant Yes Eliquis: AFib  Antibiotic Yes Doxycycline: rosacea  Opioid Yes Vicodin: pain  Antiplatelet No   Hypoglycemics/insulin No   Vasoactive Medication Yes Lasix, Toprol: HF, AFib, HTN Flomax: urinary retention  Chemotherapy Yes, IV Chemotherapy Herceptin: breast cancer  Other Yes Prilosec, Tums: GERD Tylenol: pain Calcium+D, VitD, B complex, MVI: supplement Diazepam: mood/sleep Docusate, Bisacodyl, Senokot: constipation Gabapentin: nerve pain Levothyroxine: hypothyroid Robaxin: muscle spasms Protonix: GERD Crestor: HLD Trazodone: sleep Metrocream: rosacea  Estradiol cream: vaginal atrophy     Type of Medication Issue Identified Description of Issue Recommendation(s)  Drug Interaction(s) (clinically significant)     Duplicate Therapy     Allergy     No Medication Administration End Date     Incorrect Dose     Additional Drug Therapy Needed     Significant med changes from prior encounter (inform family/care partners about these prior to discharge).    Other       Clinically significant medication issues were identified that warrant physician communication and completion of prescribed/recommended actions by midnight of the next day:  No   Time spent performing this drug regimen review (minutes): 30  Loura Back, PharmD, BCPS Clinical Pharmacist Clinical phone for 07/18/2023 is [phone] 07/18/2023 7:16 AM

## 2023-07-18 NOTE — Progress Notes (Signed)
Inpatient Rehabilitation Care Coordinator Discharge Note   Patient Details  Name: Karen Allison MRN: [id number] Date of Birth: [birth year]   Discharge location: Home  Length of Stay: 13 Days  Discharge activity level: Sup  Home/community participation: spouse and granddaughters  Patient response WU:[id number] Literacy - How often do you need to have someone help you when you read instructions, pamphlets, or other written material from your doctor or pharmacy?: Rarely  Patient response YN:[id number] Isolation - How often do you feel lonely or isolated from those around you?: Never  Services provided included: MD, PT, RD, OT, SLP, RN, CM, TR, Pharmacy, Neuropsych, SW  Financial Services:  Field seismologist Utilized: Medicare    Choices offered to/list presented to: patient  Follow-up services arranged:  Home Health Home Health Agency: [facility]         Patient response to transportation need: Is the patient able to respond to transportation needs?: Yes In the past 12 months, has lack of transportation kept you from medical appointments or from getting medications?: No In the past 12 months, has lack of transportation kept you from meetings, work, or from getting things needed for daily living?: No   Patient/Family verbalized understanding of follow-up arrangements:  Yes  Individual responsible for coordination of the follow-up plan: self or granddaughter, Lequita Halt  Confirmed correct DME delivered: Andria Rhein 07/18/2023    Comments (or additional information):  Summary of Stay    Date/Time Discharge Planning CSW  07/11/23 1552 Discharging home with support from spouse, daughter and 2 granddaughters (primarily). 2 steps to enter fornt door, 2 to enter side door and 5 to enter back door. CJB       Andria Rhein

## 2023-07-19 ENCOUNTER — Other Ambulatory Visit (HOSPITAL_BASED_OUTPATIENT_CLINIC_OR_DEPARTMENT_OTHER): Payer: Self-pay

## 2023-07-19 DIAGNOSIS — K5900 Constipation, unspecified: Secondary | ICD-10-CM

## 2023-07-19 NOTE — Progress Notes (Signed)
Recreational Therapy Discharge Summary Patient Details  Name: Karen Allison MRN: [id number] Date of Birth: 1946/01/31 Today's Date: 07/19/2023  Comments on progress toward goals: Pt has made good progress during LOS and is discharging home today with family at overall supervision level.  TR sessions focused on pt education in regards to leisure education, activity tolerance, coping/stress management, discharge planning, pet safety.  Reasons for discharge: discharge from hospital  Follow-up: Home Health  Patient/family agrees with progress made and goals achieved: Yes  Brytney Somes 07/19/2023, 9:22 AM

## 2023-07-19 NOTE — Progress Notes (Signed)
PROGRESS NOTE   Subjective/Complaints: No new concerns this AM. She is looking forward to be going home today.    ROS: Patient denies fever, chills, rash, sore throat, blurred vision, dizziness, nausea, vomiting, diarrhea, constipation, shortness of breath or chest pain,   headache, or mood change. + Left hip pain Insomnia-i improved Cough and bad taste in mouth-improved  Objective:   DG Pelvis Comp Min 3V  Result Date: 07/18/2023 CLINICAL DATA:  [id number] Acetabular fracture [facility]) [id number] Fracture follow-up [id number] EXAM: JUDET PELVIS - 3+ VIEW COMPARISON:  07/03/2023 FINDINGS: ORIF of right acetabular fracture with side plate and screw fixation construct. Hardware appears intact. Fracture alignment is near anatomic. Partially imaged lumbar fusion hardware. No new pelvic fractures. No diastasis. No hip joint dislocation. IMPRESSION: ORIF of right acetabular fracture without evidence of hardware complication. Electronically Signed   By: Duanne Guess D.O.   On: 07/18/2023 16:10   Recent Labs    07/18/23 1438  WBC 7.3  HGB 10.4*  HCT 33.1*  PLT 318    Recent Labs    07/18/23 1438  NA 134*  K 4.2  CL 100  CO2 24  GLUCOSE 171*  BUN 9  CREATININE 0.66  CALCIUM 8.5*     Intake/Output Summary (Last 24 hours) at 07/19/2023 0847 Last data filed at 07/19/2023 0739 Gross per 24 hour  Intake 721 ml  Output --  Net 721 ml        Physical Exam: Vital Signs Blood pressure 128/79, pulse (!) 103, temperature 98.2 F (36.8 C), resp. rate 16, height 5\' 1"  (1.549 m), weight 60.5 kg, SpO2 95%. Constitutional: No distress . Vital signs reviewed.  Sitting at edge of bed. Getting ready to go home.  HEENT: NCAT, EOMI, oral membranes moist Neck: supple Cardiovascular: RRR without murmur. No JVD  Respiratory/Chest: CTA Bilaterally without wheezes or rales.  Nonlabored breathing, on room air GI/Abdomen: BS +, non-tender,  non-distended Ext: no clubbing, cyanosis, trace B/L LE edema Psych: pleasant and cooperative  Neuro: Awake, alert and awake, CN 2-12 grossly intact, follows commands Strength:  Ue's 5-/5 B/L RLE 4/5 HF; KE 4+/5-improved. DF/PF 5-/5 LLE- HF 4/5; KE 4+/5; DF/PF 5-/5-unchanged  Skin: Surgical wound stable.  MSK: As expected mild tenderness around surgical incisions and mild tenderness in the thigh on the right    Assessment/Plan: 1. Functional deficits which require 3+ hours per day of interdisciplinary therapy in a comprehensive inpatient rehab setting. Physiatrist is providing close team supervision and 24 hour management of active medical problems listed below. Physiatrist and rehab team continue to assess barriers to discharge/monitor patient progress toward functional and medical goals  Care Tool:  Bathing    Body parts bathed by patient: Right arm, Left arm, Chest, Abdomen, Right upper leg, Left upper leg, Face, Front perineal area, Buttocks, Right lower leg, Left lower leg   Body parts bathed by helper: Right lower leg, Left lower leg, Front perineal area, Buttocks     Bathing assist Assist Level: Set up assist     Upper Body Dressing/Undressing Upper body dressing   What is the patient wearing?: Pull over shirt    Upper body assist Assist Level:  Independent with assistive device    Lower Body Dressing/Undressing Lower body dressing      What is the patient wearing?: Incontinence brief, Pants     Lower body assist Assist for lower body dressing: Supervision/Verbal cueing     Toileting Toileting    Toileting assist Assist for toileting: Supervision/Verbal cueing     Transfers Chair/bed transfer  Transfers assist     Chair/bed transfer assist level: Supervision/Verbal cueing     Locomotion Ambulation   Ambulation assist      Assist level: Supervision/Verbal cueing Assistive device: Walker-rolling Max distance: 29ft   Walk 10 feet  activity   Assist     Assist level: Supervision/Verbal cueing Assistive device: Walker-rolling   Walk 50 feet activity   Assist Walk 50 feet with 2 turns activity did not occur: Safety/medical concerns (fatigue, weakness, pain, decreased balance)         Walk 150 feet activity   Assist Walk 150 feet activity did not occur: Safety/medical concerns (fatigue, weakness, pain, decreased balance)         Walk 10 feet on uneven surface  activity   Assist Walk 10 feet on uneven surfaces activity did not occur: Safety/medical concerns (fatigue, pain, decreased adherance to RLE TDWB precautions)   Assist level: Contact Guard/Touching assist Assistive device: Walker-rolling   Wheelchair     Assist Is the patient using a wheelchair?: Yes Type of Wheelchair: Manual    Wheelchair assist level: Independent Max wheelchair distance: 15ft    Wheelchair 50 feet with 2 turns activity    Assist    Wheelchair 50 feet with 2 turns activity did not occur:  (fatigue)   Assist Level: Independent   Wheelchair 150 feet activity     Assist  Wheelchair 150 feet activity did not occur:  (fatigue)   Assist Level: Independent   Blood pressure 128/79, pulse (!) 103, temperature 98.2 F (36.8 C), resp. rate 16, height 5\' 1"  (1.549 m), weight 60.5 kg, SpO2 95%.   Medical Problem List and Plan: 1. Functional deficits secondary to right comminuted acetabular/right inferior pubic ramus fracture fracture.  Status post ORIF right acetabular fracture 07/03/2023.  Touchdown weightbearing.  No posterior hip precautions needed             -patient may  shower- cover incision             -ELOS/Goals: 10-12 days mod I to supervision  Grounds pass ordered  -Continue CIR therapies including PT, OT    -Patient requested nighttime medications to be given earlier in the day if possible, Senokot and docusate timing adjusted-patient reports she did better with this medication change  -DC  home today,  2.  Antithrombotics: -DVT/anticoagulation:  Pharmaceutical: Eliquis             -antiplatelet therapy: N/A 3. Pain Management: Neurontin 600 mg 3 times daily, Robaxin 500 mg 4 times daily, hydrocodone as needed- will increase Robaxin to 750 mg q6H  -11/17 order kpad today. Likely mild hip flexor strain  11-24: Ordered extra Vicodin nightly as needed  11/25 Robaxin decreased to 500 mg per patient request-patient reports this is working well for her  Reports pain is controlled.   4. Mood/Behavior/Sleep: Valium 10 mg nightly as prior to admission.  Provide emotional support             -antipsychotic agents: N/A 5. Neuropsych/cognition: This patient is capable of making decisions on her own behalf. 6. Skin/Wound Care: Routine skin checks 7.  Fluids/Electrolytes/Nutrition: Routine in and outs with follow-up chemistries 8.  Atrial fibrillation/flutter with RVR.  Followed by Dr. Dulce Sellar.  Toprol XL 50 mg nightly.Refused Cardizem 9.  Hypothyroidism.  Synthroid 10.  History of rosacea.  Patient maintained on chronic doxycycline 11.  Hyperlipidemia.  Crestor 12.  Stage IV breast cancer with mets to liver and lung.  Patient receives Trastuzumab infusion every 3 weeks followed by Dr. Gery Pray of oncology services.  Discussed that infusion will have to be postponed to post-rehab 13.  Chronic left shoulder pain.  Patient did receive steroid injection 07/05/2023 per orthopedic services  -pain mgt as above 14.  History of urinary retention.  Maintained on Flomax as prior to admission.   -pt voiding continently without issue 15. Chronic constipation.  Senokot-S 2 tablets twice daily, Colace 100 mg twice daily, Dulcolax suppository as needed- magnesium gluconate 250mg  ordered HS  11/18: moved bowels all night, decrease senna docusate to 2 tabs HS  11/19: type 5 stool this morning, decrease colace to daily  Last bowel movement 11-26-improved  16. Hyponatremia: encouraged salting  foods  17. Suboptimal potassium: kdur ordered 11/15, ordered 11/18, check potassium today  -11/27 K+ stable at 4.2  18. Lower ext edema  -11/17 give dose of lasix today x 1  11-23: 20 mg as needed Lasix given, along with 40 mill equivalents potassium  11-24: Improved per patient.    19. Insomnia: increase gabapentin to 1200mg  HS as she takes at home, decrease trazodone to 25mg  HS  -Trazodone changed to as needed per patient request  20. GERD: d/c protonix since she brought in her home omeprazole. Added tums with meals, decreased to prn since symtoms improved with omeprazole  -07/17/23 symptoms of bad taste in her mouth could be related to GERD, considering going up on Prilosec or switching back to Protonix if this continues, we will schedule Tums again  -11/27 symptoms remain improved,continue current regimen  21. RLE cramping: VAS [place] ordered and discussed with patient that it is negative, calcium supplement added via tums prn  22. Hypocalcemia: added tums with meals, can wean if omeprazole helps with GERD, decrease to prn  23. Daytime somnolence: decrease trazodone to 25mg  HS, improved, continue this dose -No notable over this weekend Improved   23. Hypotension: medications reviewed and nor on any medications purely for HTN -Vitals have remained stable, no symptomatic orthostasis -11/27 BP stable     07/19/2023    5:10 AM 07/18/2023    9:10 PM 07/18/2023    8:15 PM  Vitals with BMI  Systolic 128 137 409  Diastolic 79 63 63  Pulse 103 96 96     LOS: 13 days A FACE TO FACE EVALUATION WAS PERFORMED  Fanny Dance 07/19/2023, 8:47 AM

## 2023-07-19 NOTE — Progress Notes (Signed)
Patient ID: Karen Allison, female   DOB: [birth year], 77 y.o.   MRN: [id number]  SW met with patient to discuss d/c. SW discussed follow up appointments with patient. Granddaughter will be present to pick patient today. No additional questions or concerns.

## 2023-07-24 ENCOUNTER — Ambulatory Visit: Payer: Medicare Other

## 2023-07-24 DIAGNOSIS — Z79891 Long term (current) use of opiate analgesic: Secondary | ICD-10-CM | POA: Diagnosis not present

## 2023-07-24 DIAGNOSIS — K5909 Other constipation: Secondary | ICD-10-CM | POA: Diagnosis not present

## 2023-07-24 DIAGNOSIS — S32431D Displaced fracture of anterior column [iliopubic] of right acetabulum, subsequent encounter for fracture with routine healing: Secondary | ICD-10-CM | POA: Diagnosis not present

## 2023-07-24 DIAGNOSIS — E039 Hypothyroidism, unspecified: Secondary | ICD-10-CM | POA: Diagnosis not present

## 2023-07-24 DIAGNOSIS — I4891 Unspecified atrial fibrillation: Secondary | ICD-10-CM | POA: Diagnosis not present

## 2023-07-24 DIAGNOSIS — C787 Secondary malignant neoplasm of liver and intrahepatic bile duct: Secondary | ICD-10-CM | POA: Diagnosis not present

## 2023-07-24 DIAGNOSIS — C7800 Secondary malignant neoplasm of unspecified lung: Secondary | ICD-10-CM | POA: Diagnosis not present

## 2023-07-24 DIAGNOSIS — C50919 Malignant neoplasm of unspecified site of unspecified female breast: Secondary | ICD-10-CM | POA: Diagnosis not present

## 2023-07-24 DIAGNOSIS — G4700 Insomnia, unspecified: Secondary | ICD-10-CM | POA: Diagnosis not present

## 2023-07-24 DIAGNOSIS — Z7901 Long term (current) use of anticoagulants: Secondary | ICD-10-CM | POA: Diagnosis not present

## 2023-07-24 DIAGNOSIS — M25512 Pain in left shoulder: Secondary | ICD-10-CM | POA: Diagnosis not present

## 2023-07-24 DIAGNOSIS — E785 Hyperlipidemia, unspecified: Secondary | ICD-10-CM | POA: Diagnosis not present

## 2023-07-25 ENCOUNTER — Other Ambulatory Visit: Payer: Self-pay

## 2023-07-25 ENCOUNTER — Other Ambulatory Visit: Payer: Medicare Other

## 2023-07-25 MED ORDER — APIXABAN 5 MG PO TABS: ORAL_TABLET | ORAL | 3 refills | Status: DC

## 2023-07-26 DIAGNOSIS — C7800 Secondary malignant neoplasm of unspecified lung: Secondary | ICD-10-CM | POA: Diagnosis not present

## 2023-07-26 DIAGNOSIS — C50919 Malignant neoplasm of unspecified site of unspecified female breast: Secondary | ICD-10-CM | POA: Diagnosis not present

## 2023-07-26 DIAGNOSIS — E039 Hypothyroidism, unspecified: Secondary | ICD-10-CM | POA: Diagnosis not present

## 2023-07-26 DIAGNOSIS — C787 Secondary malignant neoplasm of liver and intrahepatic bile duct: Secondary | ICD-10-CM | POA: Diagnosis not present

## 2023-07-26 DIAGNOSIS — S32431D Displaced fracture of anterior column [iliopubic] of right acetabulum, subsequent encounter for fracture with routine healing: Secondary | ICD-10-CM | POA: Diagnosis not present

## 2023-07-26 DIAGNOSIS — I4891 Unspecified atrial fibrillation: Secondary | ICD-10-CM | POA: Diagnosis not present

## 2023-07-27 DIAGNOSIS — I4891 Unspecified atrial fibrillation: Secondary | ICD-10-CM | POA: Diagnosis not present

## 2023-07-27 DIAGNOSIS — S32431D Displaced fracture of anterior column [iliopubic] of right acetabulum, subsequent encounter for fracture with routine healing: Secondary | ICD-10-CM | POA: Diagnosis not present

## 2023-07-27 DIAGNOSIS — E039 Hypothyroidism, unspecified: Secondary | ICD-10-CM | POA: Diagnosis not present

## 2023-07-27 DIAGNOSIS — C50919 Malignant neoplasm of unspecified site of unspecified female breast: Secondary | ICD-10-CM | POA: Diagnosis not present

## 2023-07-27 DIAGNOSIS — C7800 Secondary malignant neoplasm of unspecified lung: Secondary | ICD-10-CM | POA: Diagnosis not present

## 2023-07-27 DIAGNOSIS — C787 Secondary malignant neoplasm of liver and intrahepatic bile duct: Secondary | ICD-10-CM | POA: Diagnosis not present

## 2023-07-28 ENCOUNTER — Inpatient Hospital Stay: Payer: Medicare Other | Attending: Oncology

## 2023-07-28 VITALS — BP 128/60 | HR 74 | Temp 98.0°F | Resp 16

## 2023-07-28 DIAGNOSIS — C787 Secondary malignant neoplasm of liver and intrahepatic bile duct: Secondary | ICD-10-CM | POA: Insufficient documentation

## 2023-07-28 DIAGNOSIS — C50912 Malignant neoplasm of unspecified site of left female breast: Secondary | ICD-10-CM | POA: Insufficient documentation

## 2023-07-28 DIAGNOSIS — Z5112 Encounter for antineoplastic immunotherapy: Secondary | ICD-10-CM | POA: Diagnosis not present

## 2023-07-28 DIAGNOSIS — Z170 Estrogen receptor positive status [ER+]: Secondary | ICD-10-CM | POA: Diagnosis not present

## 2023-07-28 DIAGNOSIS — I10 Essential (primary) hypertension: Secondary | ICD-10-CM | POA: Insufficient documentation

## 2023-07-28 DIAGNOSIS — Z79899 Other long term (current) drug therapy: Secondary | ICD-10-CM | POA: Insufficient documentation

## 2023-07-28 MED ORDER — HEPARIN SOD (PORK) LOCK FLUSH 100 UNIT/ML IV SOLN: 500.0000 [IU] | Freq: Once | INTRAVENOUS | Status: DC | PRN

## 2023-07-28 MED ORDER — SODIUM CHLORIDE 0.9% FLUSH: 10.0000 mL | INTRAVENOUS | Status: DC | PRN

## 2023-07-28 MED ORDER — SODIUM CHLORIDE 0.9 % IV SOLN
Freq: Once | INTRAVENOUS | Status: AC
Start: 2023-07-28 — End: 2023-07-28

## 2023-07-28 MED ORDER — TRASTUZUMAB-ANNS CHEMO 420 MG IV SOLR
6.0000 mg/kg | Freq: Once | INTRAVENOUS | Status: AC
  Administered 2023-07-28: 378 mg via INTRAVENOUS
  Filled 2023-07-28: qty 18

## 2023-07-28 NOTE — Patient Instructions (Signed)
Trastuzumab Injection What is this medication? TRASTUZUMAB (tras TOO zoo mab) treats breast cancer and stomach cancer. It works by blocking a protein that causes cancer cells to grow and multiply. This helps to slow or stop the spread of cancer cells. This medicine may be used for other purposes; ask your health care provider or pharmacist if you have questions. COMMON BRAND NAME(S): Herceptin, Herzuma, KANJINTI, Ogivri, Ontruzant, Trazimera What should I tell my care team before I take this medication? They need to know if you have any of these conditions: Heart failure Lung disease An unusual or allergic reaction to trastuzumab, other medications, foods, dyes, or preservatives Pregnant or trying to get pregnant Breast-feeding How should I use this medication? This medication is injected into a vein. It is given by your care team in a hospital or clinic setting. Talk to your care team about the use of this medication in children. It is not approved for use in children. Overdosage: If you think you have taken too much of this medicine contact a poison control center or emergency room at once. NOTE: This medicine is only for you. Do not share this medicine with others. What if I miss a dose? Keep appointments for follow-up doses. It is important not to miss your dose. Call your care team if you are unable to keep an appointment. What may interact with this medication? Certain types of chemotherapy, such as daunorubicin, doxorubicin, epirubicin, idarubicin This list may not describe all possible interactions. Give your health care provider a list of all the medicines, herbs, non-prescription drugs, or dietary supplements you use. Also tell them if you smoke, drink alcohol, or use illegal drugs. Some items may interact with your medicine. What should I watch for while using this medication? Your condition will be monitored carefully while you are receiving this medication. This medication may  make you feel generally unwell. This is not uncommon, as chemotherapy affects healthy cells as well as cancer cells. Report any side effects. Continue your course of treatment even though you feel ill unless your care team tells you to stop. This medication may increase your risk of getting an infection. Call your care team for advice if you get a fever, chills, sore throat, or other symptoms of a cold or flu. Do not treat yourself. Try to avoid being around people who are sick. Avoid taking medications that contain aspirin, acetaminophen, ibuprofen, naproxen, or ketoprofen unless instructed by your care team. These medications can hide a fever. Talk to your care team if you may be pregnant. Serious birth defects can occur if you take this medication during pregnancy and for 7 months after the last dose. You will need a negative pregnancy test before starting this medication. Contraception is recommended while taking this medication and for 7 months after the last dose. Your care team can help you find the option that works for you. Do not breastfeed while taking this medication and for 7 months after stopping treatment. What side effects may I notice from receiving this medication? Side effects that you should report to your care team as soon as possible: Allergic reactions or angioedema--skin rash, itching or hives, swelling of the face, eyes, lips, tongue, arms, or legs, trouble swallowing or breathing Dry cough, shortness of breath or trouble breathing Heart failure--shortness of breath, swelling of the ankles, feet, or hands, sudden weight gain, unusual weakness or fatigue Infection--fever, chills, cough, or sore throat Infusion reactions--chest pain, shortness of breath or trouble breathing, feeling faint or   lightheaded Side effects that usually do not require medical attention (report to your care team if they continue or are bothersome): Diarrhea Dizziness Headache Nausea Trouble  sleeping Vomiting This list may not describe all possible side effects. Call your doctor for medical advice about side effects. You may report side effects to FDA at 1-800-FDA-1088. Where should I keep my medication? This medication is given in a hospital or clinic. It will not be stored at home. NOTE: This sheet is a summary. It may not cover all possible information. If you have questions about this medicine, talk to your doctor, pharmacist, or health care provider.  2024 Elsevier/Gold Standard (2021-12-21 00:00:00)  

## 2023-07-28 NOTE — Progress Notes (Signed)
Do not reload trastuzumab dose per [facility] despite missing dose by more than 1 week.

## 2023-08-01 DIAGNOSIS — S32431D Displaced fracture of anterior column [iliopubic] of right acetabulum, subsequent encounter for fracture with routine healing: Secondary | ICD-10-CM | POA: Diagnosis not present

## 2023-08-01 DIAGNOSIS — C787 Secondary malignant neoplasm of liver and intrahepatic bile duct: Secondary | ICD-10-CM | POA: Diagnosis not present

## 2023-08-01 DIAGNOSIS — C50919 Malignant neoplasm of unspecified site of unspecified female breast: Secondary | ICD-10-CM | POA: Diagnosis not present

## 2023-08-01 DIAGNOSIS — E039 Hypothyroidism, unspecified: Secondary | ICD-10-CM | POA: Diagnosis not present

## 2023-08-01 DIAGNOSIS — C7800 Secondary malignant neoplasm of unspecified lung: Secondary | ICD-10-CM | POA: Diagnosis not present

## 2023-08-01 DIAGNOSIS — I4891 Unspecified atrial fibrillation: Secondary | ICD-10-CM | POA: Diagnosis not present

## 2023-08-07 DIAGNOSIS — S32401D Unspecified fracture of right acetabulum, subsequent encounter for fracture with routine healing: Secondary | ICD-10-CM | POA: Diagnosis not present

## 2023-08-07 DIAGNOSIS — M79604 Pain in right leg: Secondary | ICD-10-CM | POA: Diagnosis not present

## 2023-08-08 DIAGNOSIS — C50919 Malignant neoplasm of unspecified site of unspecified female breast: Secondary | ICD-10-CM | POA: Diagnosis not present

## 2023-08-08 DIAGNOSIS — E039 Hypothyroidism, unspecified: Secondary | ICD-10-CM | POA: Diagnosis not present

## 2023-08-08 DIAGNOSIS — C7800 Secondary malignant neoplasm of unspecified lung: Secondary | ICD-10-CM | POA: Diagnosis not present

## 2023-08-08 DIAGNOSIS — S32431D Displaced fracture of anterior column [iliopubic] of right acetabulum, subsequent encounter for fracture with routine healing: Secondary | ICD-10-CM | POA: Diagnosis not present

## 2023-08-08 DIAGNOSIS — C787 Secondary malignant neoplasm of liver and intrahepatic bile duct: Secondary | ICD-10-CM | POA: Diagnosis not present

## 2023-08-08 DIAGNOSIS — I4891 Unspecified atrial fibrillation: Secondary | ICD-10-CM | POA: Diagnosis not present

## 2023-08-10 DIAGNOSIS — C787 Secondary malignant neoplasm of liver and intrahepatic bile duct: Secondary | ICD-10-CM | POA: Diagnosis not present

## 2023-08-10 DIAGNOSIS — C7800 Secondary malignant neoplasm of unspecified lung: Secondary | ICD-10-CM | POA: Diagnosis not present

## 2023-08-10 DIAGNOSIS — S32431D Displaced fracture of anterior column [iliopubic] of right acetabulum, subsequent encounter for fracture with routine healing: Secondary | ICD-10-CM | POA: Diagnosis not present

## 2023-08-10 DIAGNOSIS — C50919 Malignant neoplasm of unspecified site of unspecified female breast: Secondary | ICD-10-CM | POA: Diagnosis not present

## 2023-08-10 DIAGNOSIS — I4891 Unspecified atrial fibrillation: Secondary | ICD-10-CM | POA: Diagnosis not present

## 2023-08-10 DIAGNOSIS — E039 Hypothyroidism, unspecified: Secondary | ICD-10-CM | POA: Diagnosis not present

## 2023-08-11 ENCOUNTER — Other Ambulatory Visit: Payer: Self-pay

## 2023-08-11 ENCOUNTER — Other Ambulatory Visit: Payer: Self-pay | Admitting: Oncology

## 2023-08-11 DIAGNOSIS — C50912 Malignant neoplasm of unspecified site of left female breast: Secondary | ICD-10-CM

## 2023-08-17 DIAGNOSIS — C787 Secondary malignant neoplasm of liver and intrahepatic bile duct: Secondary | ICD-10-CM | POA: Diagnosis not present

## 2023-08-17 DIAGNOSIS — C7800 Secondary malignant neoplasm of unspecified lung: Secondary | ICD-10-CM | POA: Diagnosis not present

## 2023-08-17 DIAGNOSIS — S32431D Displaced fracture of anterior column [iliopubic] of right acetabulum, subsequent encounter for fracture with routine healing: Secondary | ICD-10-CM | POA: Diagnosis not present

## 2023-08-17 DIAGNOSIS — C50919 Malignant neoplasm of unspecified site of unspecified female breast: Secondary | ICD-10-CM | POA: Diagnosis not present

## 2023-08-17 DIAGNOSIS — E039 Hypothyroidism, unspecified: Secondary | ICD-10-CM | POA: Diagnosis not present

## 2023-08-17 DIAGNOSIS — I4891 Unspecified atrial fibrillation: Secondary | ICD-10-CM | POA: Diagnosis not present

## 2023-08-18 ENCOUNTER — Inpatient Hospital Stay: Payer: Medicare Other

## 2023-08-18 VITALS — BP 154/67 | HR 79 | Temp 98.3°F | Resp 18

## 2023-08-18 DIAGNOSIS — Z170 Estrogen receptor positive status [ER+]: Secondary | ICD-10-CM | POA: Diagnosis not present

## 2023-08-18 DIAGNOSIS — C787 Secondary malignant neoplasm of liver and intrahepatic bile duct: Secondary | ICD-10-CM | POA: Diagnosis not present

## 2023-08-18 DIAGNOSIS — C50912 Malignant neoplasm of unspecified site of left female breast: Secondary | ICD-10-CM | POA: Diagnosis not present

## 2023-08-18 DIAGNOSIS — I10 Essential (primary) hypertension: Secondary | ICD-10-CM | POA: Diagnosis not present

## 2023-08-18 DIAGNOSIS — Z5112 Encounter for antineoplastic immunotherapy: Secondary | ICD-10-CM | POA: Diagnosis not present

## 2023-08-18 DIAGNOSIS — Z79899 Other long term (current) drug therapy: Secondary | ICD-10-CM | POA: Diagnosis not present

## 2023-08-18 MED ORDER — TRASTUZUMAB-ANNS CHEMO 420 MG IV SOLR
6.0000 mg/kg | Freq: Once | INTRAVENOUS | Status: AC
  Administered 2023-08-18: 378 mg via INTRAVENOUS
  Filled 2023-08-18: qty 18

## 2023-08-18 MED ORDER — SODIUM CHLORIDE 0.9 % IV SOLN
Freq: Once | INTRAVENOUS | Status: AC
Start: 2023-08-18 — End: 2023-08-18

## 2023-08-18 NOTE — Patient Instructions (Signed)
Trastuzumab Injection What is this medication? TRASTUZUMAB (tras TOO zoo mab) treats breast cancer and stomach cancer. It works by blocking a protein that causes cancer cells to grow and multiply. This helps to slow or stop the spread of cancer cells. This medicine may be used for other purposes; ask your health care provider or pharmacist if you have questions. COMMON BRAND NAME(S): Herceptin, Herzuma, KANJINTI, Ogivri, Ontruzant, Trazimera What should I tell my care team before I take this medication? They need to know if you have any of these conditions: Heart failure Lung disease An unusual or allergic reaction to trastuzumab, other medications, foods, dyes, or preservatives Pregnant or trying to get pregnant Breast-feeding How should I use this medication? This medication is injected into a vein. It is given by your care team in a hospital or clinic setting. Talk to your care team about the use of this medication in children. It is not approved for use in children. Overdosage: If you think you have taken too much of this medicine contact a poison control center or emergency room at once. NOTE: This medicine is only for you. Do not share this medicine with others. What if I miss a dose? Keep appointments for follow-up doses. It is important not to miss your dose. Call your care team if you are unable to keep an appointment. What may interact with this medication? Certain types of chemotherapy, such as daunorubicin, doxorubicin, epirubicin, idarubicin This list may not describe all possible interactions. Give your health care provider a list of all the medicines, herbs, non-prescription drugs, or dietary supplements you use. Also tell them if you smoke, drink alcohol, or use illegal drugs. Some items may interact with your medicine. What should I watch for while using this medication? Your condition will be monitored carefully while you are receiving this medication. This medication may  make you feel generally unwell. This is not uncommon, as chemotherapy affects healthy cells as well as cancer cells. Report any side effects. Continue your course of treatment even though you feel ill unless your care team tells you to stop. This medication may increase your risk of getting an infection. Call your care team for advice if you get a fever, chills, sore throat, or other symptoms of a cold or flu. Do not treat yourself. Try to avoid being around people who are sick. Avoid taking medications that contain aspirin, acetaminophen, ibuprofen, naproxen, or ketoprofen unless instructed by your care team. These medications can hide a fever. Talk to your care team if you may be pregnant. Serious birth defects can occur if you take this medication during pregnancy and for 7 months after the last dose. You will need a negative pregnancy test before starting this medication. Contraception is recommended while taking this medication and for 7 months after the last dose. Your care team can help you find the option that works for you. Do not breastfeed while taking this medication and for 7 months after stopping treatment. What side effects may I notice from receiving this medication? Side effects that you should report to your care team as soon as possible: Allergic reactions or angioedema--skin rash, itching or hives, swelling of the face, eyes, lips, tongue, arms, or legs, trouble swallowing or breathing Dry cough, shortness of breath or trouble breathing Heart failure--shortness of breath, swelling of the ankles, feet, or hands, sudden weight gain, unusual weakness or fatigue Infection--fever, chills, cough, or sore throat Infusion reactions--chest pain, shortness of breath or trouble breathing, feeling faint or   lightheaded Side effects that usually do not require medical attention (report to your care team if they continue or are bothersome): Diarrhea Dizziness Headache Nausea Trouble  sleeping Vomiting This list may not describe all possible side effects. Call your doctor for medical advice about side effects. You may report side effects to FDA at 1-800-FDA-1088. Where should I keep my medication? This medication is given in a hospital or clinic. It will not be stored at home. NOTE: This sheet is a summary. It may not cover all possible information. If you have questions about this medicine, talk to your doctor, pharmacist, or health care provider.  2024 Elsevier/Gold Standard (2021-12-21 00:00:00)  

## 2023-08-21 DIAGNOSIS — Z6824 Body mass index (BMI) 24.0-24.9, adult: Secondary | ICD-10-CM | POA: Diagnosis not present

## 2023-08-21 DIAGNOSIS — K5900 Constipation, unspecified: Secondary | ICD-10-CM | POA: Diagnosis not present

## 2023-08-21 DIAGNOSIS — I10 Essential (primary) hypertension: Secondary | ICD-10-CM | POA: Diagnosis not present

## 2023-08-21 DIAGNOSIS — E039 Hypothyroidism, unspecified: Secondary | ICD-10-CM | POA: Diagnosis not present

## 2023-08-21 DIAGNOSIS — F330 Major depressive disorder, recurrent, mild: Secondary | ICD-10-CM | POA: Diagnosis not present

## 2023-08-21 DIAGNOSIS — E785 Hyperlipidemia, unspecified: Secondary | ICD-10-CM | POA: Diagnosis not present

## 2023-08-21 DIAGNOSIS — D649 Anemia, unspecified: Secondary | ICD-10-CM | POA: Diagnosis not present

## 2023-08-21 DIAGNOSIS — Z79899 Other long term (current) drug therapy: Secondary | ICD-10-CM | POA: Diagnosis not present

## 2023-08-21 DIAGNOSIS — I480 Paroxysmal atrial fibrillation: Secondary | ICD-10-CM | POA: Diagnosis not present

## 2023-08-23 DIAGNOSIS — E785 Hyperlipidemia, unspecified: Secondary | ICD-10-CM | POA: Diagnosis not present

## 2023-08-23 DIAGNOSIS — Z79891 Long term (current) use of opiate analgesic: Secondary | ICD-10-CM | POA: Diagnosis not present

## 2023-08-23 DIAGNOSIS — G4700 Insomnia, unspecified: Secondary | ICD-10-CM | POA: Diagnosis not present

## 2023-08-23 DIAGNOSIS — C50919 Malignant neoplasm of unspecified site of unspecified female breast: Secondary | ICD-10-CM | POA: Diagnosis not present

## 2023-08-23 DIAGNOSIS — I4891 Unspecified atrial fibrillation: Secondary | ICD-10-CM | POA: Diagnosis not present

## 2023-08-23 DIAGNOSIS — E039 Hypothyroidism, unspecified: Secondary | ICD-10-CM | POA: Diagnosis not present

## 2023-08-23 DIAGNOSIS — K5909 Other constipation: Secondary | ICD-10-CM | POA: Diagnosis not present

## 2023-08-23 DIAGNOSIS — S32431D Displaced fracture of anterior column [iliopubic] of right acetabulum, subsequent encounter for fracture with routine healing: Secondary | ICD-10-CM | POA: Diagnosis not present

## 2023-08-23 DIAGNOSIS — C787 Secondary malignant neoplasm of liver and intrahepatic bile duct: Secondary | ICD-10-CM | POA: Diagnosis not present

## 2023-08-23 DIAGNOSIS — C7800 Secondary malignant neoplasm of unspecified lung: Secondary | ICD-10-CM | POA: Diagnosis not present

## 2023-08-23 DIAGNOSIS — Z7901 Long term (current) use of anticoagulants: Secondary | ICD-10-CM | POA: Diagnosis not present

## 2023-08-23 DIAGNOSIS — M25512 Pain in left shoulder: Secondary | ICD-10-CM | POA: Diagnosis not present

## 2023-08-24 DIAGNOSIS — S32431D Displaced fracture of anterior column [iliopubic] of right acetabulum, subsequent encounter for fracture with routine healing: Secondary | ICD-10-CM | POA: Diagnosis not present

## 2023-08-24 DIAGNOSIS — C7800 Secondary malignant neoplasm of unspecified lung: Secondary | ICD-10-CM | POA: Diagnosis not present

## 2023-08-24 DIAGNOSIS — C787 Secondary malignant neoplasm of liver and intrahepatic bile duct: Secondary | ICD-10-CM | POA: Diagnosis not present

## 2023-08-24 DIAGNOSIS — E039 Hypothyroidism, unspecified: Secondary | ICD-10-CM | POA: Diagnosis not present

## 2023-08-24 DIAGNOSIS — C50919 Malignant neoplasm of unspecified site of unspecified female breast: Secondary | ICD-10-CM | POA: Diagnosis not present

## 2023-08-24 DIAGNOSIS — I4891 Unspecified atrial fibrillation: Secondary | ICD-10-CM | POA: Diagnosis not present

## 2023-08-25 DIAGNOSIS — S32431D Displaced fracture of anterior column [iliopubic] of right acetabulum, subsequent encounter for fracture with routine healing: Secondary | ICD-10-CM | POA: Diagnosis not present

## 2023-08-25 DIAGNOSIS — C50919 Malignant neoplasm of unspecified site of unspecified female breast: Secondary | ICD-10-CM | POA: Diagnosis not present

## 2023-08-25 DIAGNOSIS — E039 Hypothyroidism, unspecified: Secondary | ICD-10-CM | POA: Diagnosis not present

## 2023-08-25 DIAGNOSIS — C7800 Secondary malignant neoplasm of unspecified lung: Secondary | ICD-10-CM | POA: Diagnosis not present

## 2023-08-25 DIAGNOSIS — C787 Secondary malignant neoplasm of liver and intrahepatic bile duct: Secondary | ICD-10-CM | POA: Diagnosis not present

## 2023-08-25 DIAGNOSIS — I4891 Unspecified atrial fibrillation: Secondary | ICD-10-CM | POA: Diagnosis not present

## 2023-08-28 DIAGNOSIS — E039 Hypothyroidism, unspecified: Secondary | ICD-10-CM | POA: Diagnosis not present

## 2023-08-28 DIAGNOSIS — C7800 Secondary malignant neoplasm of unspecified lung: Secondary | ICD-10-CM | POA: Diagnosis not present

## 2023-08-28 DIAGNOSIS — S32431D Displaced fracture of anterior column [iliopubic] of right acetabulum, subsequent encounter for fracture with routine healing: Secondary | ICD-10-CM | POA: Diagnosis not present

## 2023-08-28 DIAGNOSIS — C50919 Malignant neoplasm of unspecified site of unspecified female breast: Secondary | ICD-10-CM | POA: Diagnosis not present

## 2023-08-28 DIAGNOSIS — I4891 Unspecified atrial fibrillation: Secondary | ICD-10-CM | POA: Diagnosis not present

## 2023-08-28 DIAGNOSIS — C787 Secondary malignant neoplasm of liver and intrahepatic bile duct: Secondary | ICD-10-CM | POA: Diagnosis not present

## 2023-08-30 ENCOUNTER — Other Ambulatory Visit (HOSPITAL_COMMUNITY): Payer: Self-pay

## 2023-08-30 DIAGNOSIS — C50919 Malignant neoplasm of unspecified site of unspecified female breast: Secondary | ICD-10-CM | POA: Diagnosis not present

## 2023-08-30 DIAGNOSIS — I4891 Unspecified atrial fibrillation: Secondary | ICD-10-CM | POA: Diagnosis not present

## 2023-08-30 DIAGNOSIS — C7800 Secondary malignant neoplasm of unspecified lung: Secondary | ICD-10-CM | POA: Diagnosis not present

## 2023-08-30 DIAGNOSIS — C787 Secondary malignant neoplasm of liver and intrahepatic bile duct: Secondary | ICD-10-CM | POA: Diagnosis not present

## 2023-08-30 DIAGNOSIS — E039 Hypothyroidism, unspecified: Secondary | ICD-10-CM | POA: Diagnosis not present

## 2023-08-30 DIAGNOSIS — S32431D Displaced fracture of anterior column [iliopubic] of right acetabulum, subsequent encounter for fracture with routine healing: Secondary | ICD-10-CM | POA: Diagnosis not present

## 2023-08-31 ENCOUNTER — Other Ambulatory Visit: Payer: Medicare Other

## 2023-08-31 ENCOUNTER — Ambulatory Visit: Payer: Medicare Other | Admitting: Oncology

## 2023-09-04 DIAGNOSIS — S32431D Displaced fracture of anterior column [iliopubic] of right acetabulum, subsequent encounter for fracture with routine healing: Secondary | ICD-10-CM | POA: Diagnosis not present

## 2023-09-04 DIAGNOSIS — C7800 Secondary malignant neoplasm of unspecified lung: Secondary | ICD-10-CM | POA: Diagnosis not present

## 2023-09-04 DIAGNOSIS — E039 Hypothyroidism, unspecified: Secondary | ICD-10-CM | POA: Diagnosis not present

## 2023-09-04 DIAGNOSIS — C787 Secondary malignant neoplasm of liver and intrahepatic bile duct: Secondary | ICD-10-CM | POA: Diagnosis not present

## 2023-09-04 DIAGNOSIS — C50919 Malignant neoplasm of unspecified site of unspecified female breast: Secondary | ICD-10-CM | POA: Diagnosis not present

## 2023-09-04 DIAGNOSIS — I4891 Unspecified atrial fibrillation: Secondary | ICD-10-CM | POA: Diagnosis not present

## 2023-09-06 DIAGNOSIS — C7800 Secondary malignant neoplasm of unspecified lung: Secondary | ICD-10-CM | POA: Diagnosis not present

## 2023-09-06 DIAGNOSIS — S32431D Displaced fracture of anterior column [iliopubic] of right acetabulum, subsequent encounter for fracture with routine healing: Secondary | ICD-10-CM | POA: Diagnosis not present

## 2023-09-06 DIAGNOSIS — I4891 Unspecified atrial fibrillation: Secondary | ICD-10-CM | POA: Diagnosis not present

## 2023-09-06 DIAGNOSIS — C787 Secondary malignant neoplasm of liver and intrahepatic bile duct: Secondary | ICD-10-CM | POA: Diagnosis not present

## 2023-09-06 DIAGNOSIS — C50919 Malignant neoplasm of unspecified site of unspecified female breast: Secondary | ICD-10-CM | POA: Diagnosis not present

## 2023-09-06 DIAGNOSIS — E039 Hypothyroidism, unspecified: Secondary | ICD-10-CM | POA: Diagnosis not present

## 2023-09-07 ENCOUNTER — Ambulatory Visit: Payer: Medicare Other

## 2023-09-07 NOTE — Progress Notes (Signed)
[facility]  [address] [place],  [place]  [address] [phone]  Clinic Day: 09/08/2023  Referring physician: Philemon Kingdom, MD  CHIEF COMPLAINT:  CC: Stage IV breast cancer  Current Treatment:  Trastuzumab IV every 3 weeks  HISTORY OF PRESENT ILLNESS:  Karen Allison is a 78 y.o. 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  Estrogen and progesterone receptors were positive and HER2 was positive, and scans revealed liver metastases.  She was placed on a clinical trial at [facility] consisting of letrozole and trastuzumab, but she stopped the letrozole after several months because of severe arthralgias.  She also has known osteoarthritis, degenerative disc disease, and osteoporosis, and has had several back surgeries.  She is been on single agent trastuzumab now for over 12 years with good control of her disease.  This was stopped temporarily in 2011 but a PET scan a few months later revealed a new 9 mm lung nodule felt to represent recurrent disease, which did resolve after she was placed back on the trastuzumab.  She sees Dr. Costella Hatcher at [facility] yearly now, usually in July and he does scans and labs at that time.  We do perform periodic echocardiograms and her last one was in July.  She did have her last bone density scan in July of 2015, with no change, and receives Reclast every 6 months.  She had cervical disc surgery in September of 2016, and then developed a hospital-acquired pneumonia which took a long time to clear.  She had colonoscopy within the last 2 years.  She had a fall in September with 2 fractured ribs.  Her medication list was reviewed.  When I review her Reclast doses, she received this every 6 months from July of 2014 to January of 2016, and then yearly, with the last dose in January of 2017.  She was having some  tooth sensitivity but this is no longer an issue.  I did review her family history and the patient had endometrial cancer in her 33's and breast cancer in her 48's. She has a first cousin who had breast cancer and a paternal grandmother who had liver cancer, possibly metastatic.  An aunt had liver cancer.  A step uncle had multiple daughters with breast cancer.  The patient was seen in November of 2017 because of a tenderness and possible lump in the right upper outer quadrant.  A mammogram and ultrasound just revealed normal fibro fatty tissue.  She was evaluated at [facility] by Dr. Costella Hatcher in July of 2017 and there were some concerns about her scans.  There appeared to be a rim enhancing fluid collection intramuscular in the left vastus lateralis muscle measuring 2 cm which appeared new.  There was a new right posterior 6th rib expansile sclerotic lesion measuring 2.3 cm as well as an old healed right 5th rib fracture which had been stable from the year before.  She did have an MRI of the hip in August which was nonspecific with extensive postoperative findings in the lumbar spine as well as osteoarthritis, osteophytes, and degenerative changes.  There was a simple appearing fluid collection interposed between the iliotibial band and proximal margin of the vastus lateralis consistent with a small ganglion cyst or prior shearing injury.  We repeated the CT of the chest in January of 2018 to follow up on  the expansile lesion of the rib and this remained stable, and consistent with posttraumatic fracture.  She also fractured her right scapula after a fall and she has fractured several ribs on the right.  Her last ECHO in March 2022 remains normal, with an EF of 55 to 60%.  INTERVAL HISTORY:  Karen Allison is here for a follow up for stage IV breast cancer and is on maintenance Herceptin for many years. Patient states that she feels better but complains of right leg and hip pain rating 6/10 due to previous surgery. She  informed me that she had a severe fall in November and had a right acetabular fracture that required surgery. She is currently going through physical therapy for the last 8 weeks and ongoing and is healing well. She was previously on Reclast from July, 2014- January, 2017. Bone density scan from 2022 revealed osteopenia. I will start her on Prolia injections today. She continues oral vitamin D with calcium, calcium, and potassium supplements. She had labs done on 08/21/2023 and she has a WBC of 5.3, hemoglobin of 11.6,  and platelet count of 245,000. Her CMP was normal other than a low sodium of 133. She also had a low iron of 31 with a  saturation of 11%. I will order IV iron today. Her day 1 cycle 55 of Kanjinti is scheduled on 09/08/2023. She will have her echocardiogram done on February 4th at Dr. Hulen Shouts office. I will see her back in 3 months with CBC, CMP, and iron studies. We will plan to repeat CT scans in 6 months as well as bone density, and she will be due for her next dose of Prolia at that time. She denies signs of infection such as sore throat, sinus drainage, cough, or urinary symptoms.  She denies fevers or recurrent chills. She denies nausea, vomiting, chest pain, dyspnea or cough. Her appetite is ok and her weight has decreased 2 pounds over last 2 months .  REVIEW OF SYSTEMS:  Review of Systems  Constitutional: Negative.  Negative for appetite change, chills, diaphoresis, fatigue, fever and unexpected weight change.  HENT:  Negative.  Negative for hearing loss, lump/mass, mouth sores, nosebleeds, sore throat, tinnitus, trouble swallowing and voice change.   Eyes: Negative.  Negative for eye problems and icterus.  Respiratory: Negative.  Negative for chest tightness, cough, hemoptysis, shortness of breath and wheezing.   Cardiovascular: Negative.  Negative for chest pain, leg swelling and palpitations.  Gastrointestinal: Negative.  Negative for abdominal distention, abdominal pain, blood  in stool, constipation, diarrhea, nausea, rectal pain and vomiting.  Endocrine: Negative.   Genitourinary: Negative.  Negative for bladder incontinence, difficulty urinating, dyspareunia, dysuria, frequency, hematuria, menstrual problem, nocturia, pelvic pain, vaginal bleeding and vaginal discharge.   Musculoskeletal:  Positive for back pain and gait problem (using a walker). Negative for arthralgias, flank pain, myalgias, neck pain and neck stiffness.       She has osteopenia with many prior fractures Right hip pain, fracture/surgery 6/10  Skin: Negative.  Negative for itching, rash and wound.  Neurological:  Positive for gait problem (using a walker) and headaches. Negative for dizziness, extremity weakness, light-headedness, numbness, seizures and speech difficulty.  Hematological: Negative.  Negative for adenopathy. Does not bruise/bleed easily.  Psychiatric/Behavioral:  Positive for depression. Negative for confusion, decreased concentration, sleep disturbance and suicidal ideas. The patient is nervous/anxious.      VITALS:  Blood pressure 139/74, pulse 88, temperature (!) 97.5 F (36.4 C), temperature source Oral, resp. rate 18,  height 5\' 1"  (1.549 m), SpO2 99%.  Wt Readings from Last 3 Encounters:  07/19/23 131 lb 2.8 oz (59.5 kg)  07/02/23 129 lb 10.1 oz (58.8 kg)  06/26/23 133 lb 0.6 oz (60.3 kg)    Body mass index is 24.79 kg/m.  Performance status (ECOG): 1 - Symptomatic but completely ambulatory  PHYSICAL EXAM:  Physical Exam Vitals and nursing note reviewed. Exam conducted with a chaperone present.  Constitutional:      General: She is not in acute distress.    Appearance: Normal appearance. She is normal weight. She is not ill-appearing, toxic-appearing or diaphoretic.  HENT:     Head: Normocephalic and atraumatic.     Right Ear: Tympanic membrane, ear canal and external ear normal. There is no impacted cerumen.     Left Ear: Tympanic membrane, ear canal and external  ear normal. There is no impacted cerumen.     Nose: Nose normal. No congestion or rhinorrhea.     Mouth/Throat:     Mouth: Mucous membranes are moist.     Pharynx: Oropharynx is clear. No oropharyngeal exudate or posterior oropharyngeal erythema.  Eyes:     General: No scleral icterus.       Right eye: No discharge.        Left eye: No discharge.     Extraocular Movements: Extraocular movements intact.     Conjunctiva/sclera: Conjunctivae normal.     Pupils: Pupils are equal, round, and reactive to light.  Neck:     Vascular: No carotid bruit.  Cardiovascular:     Rate and Rhythm: Normal rate and regular rhythm.     Pulses: Normal pulses.     Heart sounds: Normal heart sounds. No murmur heard.    No friction rub. No gallop.  Pulmonary:     Effort: Pulmonary effort is normal. No respiratory distress.     Breath sounds: No stridor. No wheezing, rhonchi or rales.  Chest:     Chest wall: No mass or tenderness.  Breasts:    Right: Normal. No mass.     Left: Normal. No mass.     Comments: Well healed scar above the left areolar complex on the left breast Well healed scar in the left axilla Both breasts are without masses. Abdominal:     General: Bowel sounds are normal. There is no distension.     Palpations: Abdomen is soft. There is no hepatomegaly, splenomegaly or mass.     Tenderness: There is no abdominal tenderness. There is no right CVA tenderness, left CVA tenderness, guarding or rebound.     Hernia: No hernia is present.  Musculoskeletal:        General: No swelling, tenderness, deformity or signs of injury. Normal range of motion.     Cervical back: Normal range of motion and neck supple. No rigidity or tenderness.     Right lower leg: No edema.     Left lower leg: No edema.  Lymphadenopathy:     Cervical: No cervical adenopathy.  Skin:    General: Skin is warm and dry.     Coloration: Skin is not jaundiced or pale.     Findings: No bruising, erythema, lesion or rash.   Neurological:     General: No focal deficit present.     Mental Status: She is alert and oriented to person, place, and time. Mental status is at baseline.     Cranial Nerves: No cranial nerve deficit.     Sensory: No sensory  deficit.     Motor: No weakness.     Coordination: Coordination normal.     Gait: Gait normal.     Deep Tendon Reflexes: Reflexes normal.  Psychiatric:        Mood and Affect: Mood normal.        Behavior: Behavior normal.        Thought Content: Thought content normal.        Judgment: Judgment normal.     LABS:      Latest Ref Rng & Units 07/18/2023    2:38 PM 07/07/2023    6:10 AM 07/06/2023    9:26 AM  CBC  WBC 4.0 - 10.5 K/uL 7.3  9.6  9.7   Hemoglobin 12.0 - 15.0 g/dL 29.5  9.7  62.1   Hematocrit 36.0 - 46.0 % 33.1  29.5  31.1   Platelets 150 - 400 K/uL 318  214  175       Latest Ref Rng & Units 07/18/2023    2:38 PM 07/11/2023   12:48 PM 07/10/2023    6:18 AM  CMP  Glucose 70 - 99 mg/dL 308  657  846   BUN 8 - 23 mg/dL 9  10  9    Creatinine 0.44 - 1.00 mg/dL 9.62  9.52  8.41   Sodium 135 - 145 mmol/L 134  132  132   Potassium 3.5 - 5.1 mmol/L 4.2  4.6  3.5   Chloride 98 - 111 mmol/L 100  97  100   CO2 22 - 32 mmol/L 24  24  25    Calcium 8.9 - 10.3 mg/dL 8.5  8.6  8.1    Lab Results  Component Value Date   LDH 230 03/10/2006   STUDIES:      Allergies:  Allergies  Allergen Reactions   Cefuroxime Hives   Zithromax [Azithromycin] Hives    Current Medications: Current Outpatient Medications  Medication Sig Dispense Refill   diazepam (VALIUM) 10 MG tablet Take 10 mg by mouth at bedtime as needed.     DULoxetine (CYMBALTA) 30 MG capsule Take 30 mg by mouth daily.     ferrous sulfate 324 MG TBEC Take 324 mg by mouth.     HYDROcodone-acetaminophen (NORCO) 10-325 MG tablet Take 1 tablet by mouth every 4 (four) hours as needed.     LINZESS 145 MCG CAPS capsule Take 145 mcg by mouth every morning.     Potassium Chloride ER 20 MEQ  TBCR Take 1 tablet by mouth daily. Only takes when taking her fluid pill     acetaminophen (TYLENOL) 325 MG tablet Take 2 tablets (650 mg total) by mouth 3 (three) times daily as needed for mild pain (pain score 1-3).     apixaban (ELIQUIS) 5 MG TABS tablet Take 1/2 tablet twice daily 90 tablet 3   B Complex Vitamins (B COMPLEX PO) Take 1 tablet by mouth daily.     bisacodyl (DULCOLAX) 10 MG suppository Place 1 suppository (10 mg total) rectally daily as needed for moderate constipation.     Calcium Carbonate-Vitamin D (CALTRATE 600+D PO) Take 1 tablet by mouth 2 (two) times daily.     cholecalciferol (VITAMIN D3) 25 MCG (1000 UNIT) tablet Take 1 tablet (1,000 Units total) by mouth daily. 30 tablet 0   docusate sodium (COLACE) 100 MG capsule Take 2 capsules (200 mg total) by mouth daily after supper.     doxycycline (VIBRAMYCIN) 50 MG capsule Take 50 mg by mouth daily.  estradiol (ESTRACE) 0.1 MG/GM vaginal cream Place 1 Applicatorful vaginally 2 (two) times a week.     furosemide (LASIX) 20 MG tablet Take 20 mg by mouth as needed for edema or fluid.     gabapentin (NEURONTIN) 300 MG capsule Take 2 capsules (600 mg total) by mouth 2 (two) times daily AND 4 capsules (1,200 mg total) at bedtime. 180 capsule 0   levothyroxine (SYNTHROID) 50 MCG tablet Take 1 tablet (50 mcg total) by mouth daily. 30 tablet 0   methocarbamol (ROBAXIN) 500 MG tablet Take 1 tablet (500 mg total) by mouth 4 (four) times daily. 120 tablet 0   metoprolol succinate (TOPROL-XL) 50 MG 24 hr tablet Take 1 tablet (50 mg total) by mouth at bedtime. Take with or immediately following a meal. 30 tablet 0   metroNIDAZOLE (METROCREAM) 0.75 % cream Apply 1 Application topically as needed (rocesea).     Multiple Vitamin (MULTIVITAMIN WITH MINERALS) TABS Take 1 tablet by mouth daily.     omeprazole (PRILOSEC) 20 MG capsule Take 1 capsule (20 mg total) by mouth daily. 30 capsule 0   rosuvastatin (CRESTOR) 10 MG tablet Take 10 mg by  mouth once a week.     sennosides-docusate sodium (SENOKOT-S) 8.6-50 MG tablet Take 2 tablets by mouth 2 (two) times daily.     tamsulosin (FLOMAX) 0.4 MG CAPS capsule Take 1 capsule (0.4 mg total) by mouth every evening. 30 capsule 0   trastuzumab (HERCEPTIN) 440 MG injection 440 mg by Intravenous (Continuous Infusion) route every 21 ( twenty-one) days. infusion every 3 weeks     traZODone (DESYREL) 50 MG tablet Take 0.5 tablets (25 mg total) by mouth at bedtime as needed for sleep. 30 tablet 0   No current facility-administered medications for this visit.   ASSESSMENT & PLAN:  Assessment:   1.  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 diagnosed in December 2003.  Subsequent x-rays have shown possible bone metastases as well.  She was treated for a brief time with hormonal therapy but was unable to tolerate that.  She currently is on trastuzumab every 3 weeks and continues to have her disease under control. Her scans were done at [facility] in July of 2023, and were clear. She has now asked me to take over her follow up and annual scans and I have agreed. The latest scans from July of 2024 show no evidence of recurrence or metastatic disease.  2.  Osteoporosis, for which she was on yearly Reclast since July 2014.  She has had many fractures of spine, ribs, scapula, and now the right acetabulum.  However, she has now completed 10 doses and so I recommended that we stop the Reclast and continue to monitor her bone density.  Her bone density scan in May, 2023 was stable. We will plan to repeat one in the summer, but I feel she does need bone strengthening medicine in view of her many bone fractures. We will start her on Prolia today.   3.  Mild chronic hyponatremia of unknown etiology.  Her sodium is stable was 131 last time but went down to 113 in late May and she was hospitalized.   4. Multiple old and new rib fractures.   Plan: She  informed me that she had a severe fall in November and had a right acetabular fracture that required surgery. She is currently going through physical therapy for the last 8 weeks and  ongoing and is healing well. She was previously on Reclast from July, 2014- January, 2017. Bone density scan from 2022 revealed osteopenia. I will start her on Prolia injections today in view of her many fractures. She continues oral vitamin D with calcium, calcium, and potassium supplements. She had labs done on 08/21/2023 and she has a WBC of 5.3, hemoglobin of 11.6,  and platelet count of 245,000. Her CMP was normal besides a low sodium of 133. She also had a low iron of 31 with a  saturation of 11%. I will order IV iron today. Her day 1 cycle 55 of Kanjinti is scheduled on 09/08/2023. She will have her echocardiogram done on February 4th at Dr. Hulen Shouts office. I will see her back in 3 months with CBC, CMP, and iron studies. We will plan to repeat CT scans in 6 months as well as bone density, and she will also need labs in Prolia injection at that time. She knows to call sooner if problems arise regarding her breast cancer or its treatment.  She understands and agrees with this plan of care.   I provided 20 minutes of face-to-face time during this this encounter and > 50% was spent counseling as documented under my assessment and plan.   Dellia Beckwith, MD [facility] [address] [place] [address] Dept: [phone] Dept Fax: [phone]   No orders of the defined types were placed in this encounter.  I,Jasmine M Lassiter,acting as a scribe for Dellia Beckwith, MD.,have documented all relevant documentation on the behalf of Dellia Beckwith, MD,as directed by  Dellia Beckwith, MD while in the presence of Dellia Beckwith, MD.

## 2023-09-08 ENCOUNTER — Inpatient Hospital Stay: Payer: Medicare Other | Attending: Oncology | Admitting: Oncology

## 2023-09-08 ENCOUNTER — Telehealth: Payer: Self-pay

## 2023-09-08 ENCOUNTER — Other Ambulatory Visit: Payer: Self-pay | Admitting: Pharmacist

## 2023-09-08 ENCOUNTER — Inpatient Hospital Stay: Payer: Medicare Other

## 2023-09-08 ENCOUNTER — Encounter: Payer: Self-pay | Admitting: Oncology

## 2023-09-08 ENCOUNTER — Other Ambulatory Visit: Payer: Self-pay | Admitting: Oncology

## 2023-09-08 VITALS — BP 139/74 | HR 88 | Temp 97.5°F | Resp 18 | Ht 61.0 in

## 2023-09-08 VITALS — BP 143/77 | HR 84 | Temp 98.1°F | Resp 18

## 2023-09-08 DIAGNOSIS — M8000XG Age-related osteoporosis with current pathological fracture, unspecified site, subsequent encounter for fracture with delayed healing: Secondary | ICD-10-CM

## 2023-09-08 DIAGNOSIS — Z1721 Progesterone receptor positive status: Secondary | ICD-10-CM | POA: Insufficient documentation

## 2023-09-08 DIAGNOSIS — D509 Iron deficiency anemia, unspecified: Secondary | ICD-10-CM

## 2023-09-08 DIAGNOSIS — M810 Age-related osteoporosis without current pathological fracture: Secondary | ICD-10-CM | POA: Diagnosis not present

## 2023-09-08 DIAGNOSIS — Z1731 Human epidermal growth factor receptor 2 positive status: Secondary | ICD-10-CM | POA: Diagnosis not present

## 2023-09-08 DIAGNOSIS — Z7901 Long term (current) use of anticoagulants: Secondary | ICD-10-CM | POA: Diagnosis not present

## 2023-09-08 DIAGNOSIS — C787 Secondary malignant neoplasm of liver and intrahepatic bile duct: Secondary | ICD-10-CM | POA: Diagnosis not present

## 2023-09-08 DIAGNOSIS — Z79899 Other long term (current) drug therapy: Secondary | ICD-10-CM | POA: Diagnosis not present

## 2023-09-08 DIAGNOSIS — Z79620 Long term (current) use of immunosuppressive biologic: Secondary | ICD-10-CM | POA: Diagnosis not present

## 2023-09-08 DIAGNOSIS — Z5112 Encounter for antineoplastic immunotherapy: Secondary | ICD-10-CM | POA: Diagnosis not present

## 2023-09-08 DIAGNOSIS — Z170 Estrogen receptor positive status [ER+]: Secondary | ICD-10-CM | POA: Insufficient documentation

## 2023-09-08 DIAGNOSIS — C50912 Malignant neoplasm of unspecified site of left female breast: Secondary | ICD-10-CM

## 2023-09-08 DIAGNOSIS — E871 Hypo-osmolality and hyponatremia: Secondary | ICD-10-CM | POA: Insufficient documentation

## 2023-09-08 DIAGNOSIS — Z800 Family history of malignant neoplasm of digestive organs: Secondary | ICD-10-CM | POA: Insufficient documentation

## 2023-09-08 DIAGNOSIS — M549 Dorsalgia, unspecified: Secondary | ICD-10-CM | POA: Insufficient documentation

## 2023-09-08 DIAGNOSIS — Z881 Allergy status to other antibiotic agents status: Secondary | ICD-10-CM | POA: Insufficient documentation

## 2023-09-08 DIAGNOSIS — C7800 Secondary malignant neoplasm of unspecified lung: Secondary | ICD-10-CM | POA: Diagnosis not present

## 2023-09-08 HISTORY — DX: Iron deficiency anemia, unspecified: D50.9

## 2023-09-08 MED ORDER — IRON SUCROSE 20 MG/ML IV SOLN
200.0000 mg | Freq: Once | INTRAVENOUS | Status: AC
  Administered 2023-09-08: 200 mg via INTRAVENOUS
  Filled 2023-09-08: qty 10

## 2023-09-08 MED ORDER — TRASTUZUMAB-ANNS CHEMO 420 MG IV SOLR
6.0000 mg/kg | Freq: Once | INTRAVENOUS | Status: AC
  Administered 2023-09-08: 378 mg via INTRAVENOUS
  Filled 2023-09-08: qty 18

## 2023-09-08 MED ORDER — SODIUM CHLORIDE 0.9 % IV SOLN: Freq: Once | INTRAVENOUS | Status: DC

## 2023-09-08 MED ORDER — SODIUM CHLORIDE 0.9 % IV SOLN: Freq: Once | INTRAVENOUS | Status: AC

## 2023-09-08 MED ORDER — DENOSUMAB 60 MG/ML ~~LOC~~ SOSY
60.0000 mg | PREFILLED_SYRINGE | Freq: Once | SUBCUTANEOUS | Status: AC
Start: 2023-09-08 — End: 2023-09-08
  Administered 2023-09-08: 60 mg via SUBCUTANEOUS
  Filled 2023-09-08: qty 1

## 2023-09-08 NOTE — Telephone Encounter (Signed)
Pt had labs drawn @ PCP office on 08/21/2023. CBC, CMP, & iron panel. The results are under the Media tab, listed as Ambulatory correspondence on 08/25/2023 Cumulative Reports.

## 2023-09-08 NOTE — Patient Instructions (Signed)
Denosumab Injection (Osteoporosis) What is this medication? DENOSUMAB (den oh SUE mab) prevents and treats osteoporosis. It works by Interior and spatial designer stronger and less likely to break (fracture). It is a monoclonal antibody. This medicine may be used for other purposes; ask your health care provider or pharmacist if you have questions. COMMON BRAND NAME(S): Prolia What should I tell my care team before I take this medication? They need to know if you have any of these conditions: Dental or gum disease Had thyroid or parathyroid (glands located in neck) surgery Having dental surgery or a tooth pulled Kidney disease Low levels of calcium in the blood On dialysis Poor nutrition Thyroid disease Trouble absorbing nutrients from your food An unusual or allergic reaction to denosumab, other medications, foods, dyes, or preservatives Pregnant or trying to get pregnant Breastfeeding How should I use this medication? This medication is injected under the skin. It is given by your care team in a hospital or clinic setting. A special MedGuide will be given to you before each treatment. Be sure to read this information carefully each time. Talk to your care team about the use of this medication in children. Special care may be needed. Overdosage: If you think you have taken too much of this medicine contact a poison control center or emergency room at once. NOTE: This medicine is only for you. Do not share this medicine with others. What if I miss a dose? Keep appointments for follow-up doses. It is important not to miss your dose. Call your care team if you are unable to keep an appointment. What may interact with this medication? Do not take this medication with any of the following: Other medications that contain denosumab This medication may also interact with the following: Medications that lower your chance of fighting infection Steroid medications, such as prednisone or cortisone This  list may not describe all possible interactions. Give your health care provider a list of all the medicines, herbs, non-prescription drugs, or dietary supplements you use. Also tell them if you smoke, drink alcohol, or use illegal drugs. Some items may interact with your medicine. What should I watch for while using this medication? Your condition will be monitored carefully while you are receiving this medication. You may need blood work done while taking this medication. This medication may increase your risk of getting an infection. Call your care team for advice if you get a fever, chills, sore throat, or other symptoms of a cold or flu. Do not treat yourself. Try to avoid being around people who are sick. Tell your dentist and dental surgeon that you are taking this medication. You should not have major dental surgery while on this medication. See your dentist to have a dental exam and fix any dental problems before starting this medication. Take good care of your teeth while on this medication. Make sure you see your dentist for regular follow-up appointments. This medication may cause low levels of calcium in your body. The risk of severe side effects is increased in people with kidney disease. Your care team may prescribe calcium and vitamin D to help prevent low calcium levels while you take this medication. It is important to take calcium and vitamin D as directed by your care team. Talk to your care team if you may be pregnant. Serious birth defects may occur if you take this medication during pregnancy and for 5 months after the last dose. You will need a negative pregnancy test before starting this medication. Contraception  is recommended while taking this medication and for 5 months after the last dose. Your care team can help you find the option that works for you. Talk to your care team before breastfeeding. Changes to your treatment plan may be needed. What side effects may I notice from  receiving this medication? Side effects that you should report to your care team as soon as possible: Allergic reactions--skin rash, itching, hives, swelling of the face, lips, tongue, or throat Infection--fever, chills, cough, sore throat, wounds that don't heal, pain or trouble when passing urine, general feeling of discomfort or being unwell Low calcium level--muscle pain or cramps, confusion, tingling, or numbness in the hands or feet Osteonecrosis of the jaw--pain, swelling, or redness in the mouth, numbness of the jaw, poor healing after dental work, unusual discharge from the mouth, visible bones in the mouth Severe bone, joint, or muscle pain Skin infection--skin redness, swelling, warmth, or pain Side effects that usually do not require medical attention (report these to your care team if they continue or are bothersome): Back pain Headache Joint pain Muscle pain Pain in the hands, arms, legs, or feet Runny or stuffy nose Sore throat This list may not describe all possible side effects. Call your doctor for medical advice about side effects. You may report side effects to FDA at 1-800-FDA-1088. Where should I keep my medication? This medication is given in a hospital or clinic. It will not be stored at home. NOTE: This sheet is a summary. It may not cover all possible information. If you have questions about this medicine, talk to your doctor, pharmacist, or health care provider.  2024 Elsevier/Gold Standard (2022-09-13 00:00:00)Iron Sucrose Injection What is this medication? IRON SUCROSE (EYE ern SOO krose) treats low levels of iron (iron deficiency anemia) in people with kidney disease. Iron is a mineral that plays an important role in making red blood cells, which carry oxygen from your lungs to the rest of your body. This medicine may be used for other purposes; ask your health care provider or pharmacist if you have questions. COMMON BRAND NAME(S): Venofer What should I tell  my care team before I take this medication? They need to know if you have any of these conditions: Anemia not caused by low iron levels Heart disease High levels of iron in the blood Kidney disease Liver disease An unusual or allergic reaction to iron, other medications, foods, dyes, or preservatives Pregnant or trying to get pregnant Breastfeeding How should I use this medication? This medication is infused into a vein. It is given by your care team in a hospital or clinic setting. Talk to your care team about the use of this medication in children. While it may be prescribed for children as young as 2 years for selected conditions, precautions do apply. Overdosage: If you think you have taken too much of this medicine contact a poison control center or emergency room at once. NOTE: This medicine is only for you. Do not share this medicine with others. What if I miss a dose? Keep appointments for follow-up doses. It is important not to miss your dose. Call your care team if you are unable to keep an appointment. What may interact with this medication? Do not take this medication with any of the following: Deferoxamine Dimercaprol Other iron products This medication may also interact with the following: Chloramphenicol Deferasirox This list may not describe all possible interactions. Give your health care provider a list of all the medicines, herbs, non-prescription drugs,  or dietary supplements you use. Also tell them if you smoke, drink alcohol, or use illegal drugs. Some items may interact with your medicine. What should I watch for while using this medication? Visit your care team for regular checks on your progress. Tell your care team if your symptoms do not start to get better or if they get worse. You may need blood work done while you are taking this medication. You may need to eat more foods that contain iron. Talk to your care team. Foods that contain iron include whole grains  or cereals, dried fruits, beans, peas, leafy green vegetables, and organ meats (liver, kidney). What side effects may I notice from receiving this medication? Side effects that you should report to your care team as soon as possible: Allergic reactions--skin rash, itching, hives, swelling of the face, lips, tongue, or throat Low blood pressure--dizziness, feeling faint or lightheaded, blurry vision Shortness of breath Side effects that usually do not require medical attention (report to your care team if they continue or are bothersome): Flushing Headache Joint pain Muscle pain Nausea Pain, redness, or irritation at injection site This list may not describe all possible side effects. Call your doctor for medical advice about side effects. You may report side effects to FDA at 1-800-FDA-1088. Where should I keep my medication? This medication is given in a hospital or clinic. It will not be stored at home. NOTE: This sheet is a summary. It may not cover all possible information. If you have questions about this medicine, talk to your doctor, pharmacist, or health care provider.  2024 Elsevier/Gold Standard (2023-03-29 00:00:00)

## 2023-09-10 ENCOUNTER — Encounter (HOSPITAL_BASED_OUTPATIENT_CLINIC_OR_DEPARTMENT_OTHER): Payer: Self-pay

## 2023-09-10 ENCOUNTER — Ambulatory Visit (HOSPITAL_BASED_OUTPATIENT_CLINIC_OR_DEPARTMENT_OTHER)
Admission: EM | Admit: 2023-09-10 | Discharge: 2023-09-10 | Disposition: A | Discharge status: Home / Self Care | Payer: Medicare Other

## 2023-09-10 DIAGNOSIS — R040 Epistaxis: Secondary | ICD-10-CM | POA: Diagnosis not present

## 2023-09-10 NOTE — Discharge Instructions (Signed)
If you develop another nosebleed please lean forward and hold pressure for 15 to 20 minutes. You can use the Afrin as needed, do not use this for longer than 3 days in a row as it can cause rebound congestion. Please make sure you are nose is not too dry, you can swab it with Aquaphor or Vaseline to keep it moist.  Sleeping with a humidifier may help moisten the air.  Seek immediate care for nosebleed lasting longer than 20 minutes even with direct pressure or recurrent nosebleeds.  I suggest following up with your ENT in case you need cautery.  Return to clinic for any new or urgent symptoms.

## 2023-09-10 NOTE — ED Provider Notes (Signed)
[facility]    CSN: [id number] Arrival date & time: 09/10/23  1253      History   Chief Complaint Chief Complaint  Patient presents with   Epistaxis    HPI Karen Allison is a 78 y.o. female.   Patient presents to clinic for a nosebleed that has since resolved.  She normally puts Vaseline into her nares and forgot, they were feeling more dry than usual.  She went to blow her nose and it started to bleed around 1130 this morning.  She put pressure on her nose and leaned forward for several minutes.  She was concerned because she is on a blood thinner.  While on the way over her nosebleed stopped.  She takes 2.5 mg of Eliquis twice daily for A-fib.  She does have anemia and is recently started iron infusions, first 1 on Friday.  Reports she was spitting out some blood clots earlier.  She has broken her nose multiple times from previous falls.  The history is provided by the patient and medical records.  Epistaxis   Past Medical History:  Diagnosis Date   A-fib (HCC)    Acute respiratory failure with hypoxia (HCC) 05/09/2014   Adult hypothyroidism 05/03/2013   Arthritis    Blood dyscrasia    bleeds and bruises easily   Breast cancer (HCC)    left   Cervical spondylosis without myelopathy 05/06/2014   C3-4     Endometrial cancer (HCC) 01/21/1988   Fx lower humerus-closed 10/22/1997   Gait disorder 07/05/2016   Graves' disease 07/16/2021   HCAP (healthcare-associated pneumonia) 05/09/2014   Hepatitis     Hep A years ago   History of corticosteroid therapy 02/13/2023   History of endocrine disorder 02/13/2023   HNP (herniated nucleus pulposus), cervical 05/06/2014   Hypertension    Hyponatremia 12/02/2021   Hypothyroidism    Graves disease, age 61 yo   Malignant neoplasm of left breast (HCC) 06/05/2020   Nose trouble    right nostril will hemorrhage at times   Osteoporosis 11/04/2020   Other forms of scoliosis, thoracolumbar region 11/03/2016    Pneumonia    Rib pain on left side 09/26/2022   Rosacea    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   C3-4      Patient Active Problem List   Diagnosis Date Noted   Iron deficiency anemia 09/08/2023   Acetabular fracture (HCC) 06/29/2023   A-fib (HCC)    Arthritis    Blood dyscrasia    Breast cancer (HCC)    Hepatitis    Nose trouble    Rosacea    History of corticosteroid therapy 02/13/2023   History of endocrine disorder 02/13/2023   Rib pain on left side 09/26/2022   Hyponatremia 12/02/2021    Class: Chronic   Graves' disease 07/16/2021   Osteoporosis 11/04/2020    Class: Chronic   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   Malignant neoplasm of left breast (HCC) 06/05/2020   Other forms of scoliosis, thoracolumbar region 11/03/2016    Class: Chronic   Gait disorder 07/05/2016   HCAP (healthcare-associated pneumonia) 05/09/2014   Acute respiratory failure with hypoxia (HCC) 05/09/2014   Pneumonia 05/09/2014   Hypertension    Hypothyroidism    Spinal stenosis in cervical region 05/06/2014  Class: Chronic   Cervical spondylosis without myelopathy 05/06/2014   HNP (herniated nucleus pulposus), cervical 05/06/2014   Adult hypothyroidism 05/03/2013   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   Fx lower humerus-closed 10/22/1997   Endometrial cancer (HCC) 01/21/1988    Past Surgical History:  Procedure Laterality Date   ABDOMINAL HYSTERECTOMY     Age 16   ANTERIOR CERVICAL DECOMP/DISCECTOMY FUSION N/A 05/06/2014   Procedure: ANTERIOR CERVICAL DISCECTOMY FUSION C3-4 with transgraft, local bone graft, plate and screws;  Surgeon: Kerrin Champagne, MD;  Location: [facility] OR;  Service: Orthopedics;  Laterality: N/A;   APPENDECTOMY     BREAST LUMPECTOMY Left    COLONOSCOPY     EYE  SURGERY Bilateral    cataracts   LUMBAR SPINE SURGERY  2011   Removal of hematoma   OPEN REDUCTION INTERNAL FIXATION ACETABULAR FRACTURE STOPPA Right 07/03/2023   Procedure: OPEN REDUCTION INTERNAL FIXATION ACETABULAR FRACTURE STOPPA;  Surgeon: Roby Lofts, MD;  Location: [facility] OR;  Service: Orthopedics;  Laterality: Right;   ORIF HUMERUS FRACTURE Left 2013   THORACIC SPINE SURGERY  2011   Bone cages   THYROIDECTOMY     TONSILLECTOMY      OB History     Gravida  1   Para  1   Term  1   Preterm      AB      Living  1      SAB      IAB      Ectopic      Multiple      Live Births               Home Medications    Prior to Admission medications   Medication Sig Start Date End Date Taking? Authorizing Provider  acetaminophen (TYLENOL) 325 MG tablet Take 2 tablets (650 mg total) by mouth 3 (three) times daily as needed for mild pain (pain score 1-3). 07/06/23   Hongalgi, Maximino Greenland, MD  apixaban (ELIQUIS) 5 MG TABS tablet Take 1/2 tablet twice daily 07/25/23   Baldo Daub, MD  B Complex Vitamins (B COMPLEX PO) Take 1 tablet by mouth daily.    [provider]  bisacodyl (DULCOLAX) 10 MG suppository Place 1 suppository (10 mg total) rectally daily as needed for moderate constipation. 07/06/23   Hongalgi, Maximino Greenland, MD  Calcium Carbonate-Vitamin D (CALTRATE 600+D PO) Take 1 tablet by mouth 2 (two) times daily.    [provider]  cholecalciferol (VITAMIN D3) 25 MCG (1000 UNIT) tablet Take 1 tablet (1,000 Units total) by mouth daily. 07/18/23   Angiulli, Mcarthur Rossetti, PA-C  diazepam (VALIUM) 10 MG tablet Take 10 mg by mouth at bedtime as needed. 08/03/23   [provider]  docusate sodium (COLACE) 100 MG capsule Take 2 capsules (200 mg total) by mouth daily after supper. 07/18/23   Angiulli, Mcarthur Rossetti, PA-C  doxycycline (VIBRAMYCIN) 50 MG capsule Take 50 mg by mouth daily.    [provider]  DULoxetine (CYMBALTA) 30 MG capsule Take 30 mg  by mouth daily. 08/21/23   [provider]  estradiol (ESTRACE) 0.1 MG/GM vaginal cream Place 1 Applicatorful vaginally 2 (two) times a week. 07/06/23   Hongalgi, Maximino Greenland, MD  ferrous sulfate 324 MG TBEC Take 324 mg by mouth.    [provider]  furosemide (LASIX) 20 MG tablet Take 20 mg by mouth as needed for edema or fluid. 07/26/22   [provider]  gabapentin (NEURONTIN)  300 MG capsule Take 2 capsules (600 mg total) by mouth 2 (two) times daily AND 4 capsules (1,200 mg total) at bedtime. 07/18/23   Angiulli, Mcarthur Rossetti, PA-C  HYDROcodone-acetaminophen (NORCO) 10-325 MG tablet Take 1 tablet by mouth every 4 (four) hours as needed. 07/26/23   [provider]  levothyroxine (SYNTHROID) 50 MCG tablet Take 1 tablet (50 mcg total) by mouth daily. 07/18/23   Angiulli, Mcarthur Rossetti, PA-C  LINZESS 145 MCG CAPS capsule Take 145 mcg by mouth every morning. 08/21/23   [provider]  methocarbamol (ROBAXIN) 500 MG tablet Take 1 tablet (500 mg total) by mouth 4 (four) times daily. 07/18/23   Angiulli, Mcarthur Rossetti, PA-C  metoprolol succinate (TOPROL-XL) 50 MG 24 hr tablet Take 1 tablet (50 mg total) by mouth at bedtime. Take with or immediately following a meal. 07/18/23   Angiulli, Mcarthur Rossetti, PA-C  metroNIDAZOLE (METROCREAM) 0.75 % cream Apply 1 Application topically as needed (rocesea).    [provider]  Multiple Vitamin (MULTIVITAMIN WITH MINERALS) TABS Take 1 tablet by mouth daily.    [provider]  omeprazole (PRILOSEC) 20 MG capsule Take 1 capsule (20 mg total) by mouth daily. 07/18/23   Angiulli, Mcarthur Rossetti, PA-C  Potassium Chloride ER 20 MEQ TBCR Take 1 tablet by mouth daily. Only takes when taking her fluid pill 08/03/23   [provider]  rosuvastatin (CRESTOR) 10 MG tablet Take 10 mg by mouth once a week. 07/27/22   [provider]  sennosides-docusate sodium (SENOKOT-S) 8.6-50 MG tablet Take 2 tablets by mouth 2 (two) times  daily. 07/06/23   Hongalgi, Maximino Greenland, MD  tamsulosin (FLOMAX) 0.4 MG CAPS capsule Take 1 capsule (0.4 mg total) by mouth every evening. 07/18/23   Angiulli, Mcarthur Rossetti, PA-C  trastuzumab (HERCEPTIN) 440 MG injection 440 mg by Intravenous (Continuous Infusion) route every 21 ( twenty-one) days. infusion every 3 weeks 03/19/09   [provider]  traZODone (DESYREL) 50 MG tablet Take 0.5 tablets (25 mg total) by mouth at bedtime as needed for sleep. 07/18/23   Angiulli, Mcarthur Rossetti, PA-C    Family History Family History  Problem Relation Age of Onset   Congestive Heart Failure Mother    Heart disease Father    Rheum arthritis Daughter    Tongue cancer Grandchild    Breast cancer Neg Hx     Social History Social History   Tobacco Use   Smoking status: Former    Current packs/day: 0.00    Types: Cigarettes    Quit date: 04/23/1962    Years since quitting: 61.4   Smokeless tobacco: Never   Tobacco comments:    as teenager  Vaping Use   Vaping status: Never Used  Substance Use Topics   Alcohol use: Not Currently    Comment: Social   Drug use: No     Allergies   Cefuroxime and Zithromax [azithromycin]   Review of Systems Review of Systems  Per HPI   Physical Exam Triage Vital Signs ED Triage Vitals [09/10/23 1320]  Encounter Vitals Group     BP (!) 153/88     Systolic BP Percentile      Diastolic BP Percentile      Pulse Rate 85     Resp 20     Temp 98.4 F (36.9 C)     Temp Source Oral     SpO2 96 %     Weight      Height  Head Circumference      Peak Flow      Pain Score 0     Pain Loc      Pain Education      Exclude from Growth Chart    No data found.  Updated Vital Signs BP (!) 153/88 (BP Location: Right Arm)   Pulse 85   Temp 98.4 F (36.9 C) (Oral)   Resp 20   SpO2 96%   Visual Acuity Right Eye Distance:   Left Eye Distance:   Bilateral Distance:    Right Eye Near:   Left Eye Near:    Bilateral Near:     Physical Exam Vitals  and nursing note reviewed.  Constitutional:      Appearance: Normal appearance.  HENT:     Head: Normocephalic.     Right Ear: External ear normal.     Left Ear: External ear normal.     Nose: Nasal deformity and septal deviation present.     Comments: Patient with healed broken nose from previous falls, deformity more pronounced on the right, where the bleeding originated from. No current epistaxis. Right nare inflamed.     Mouth/Throat:     Mouth: Mucous membranes are moist.  Eyes:     Conjunctiva/sclera: Conjunctivae normal.  Cardiovascular:     Rate and Rhythm: Normal rate.  Pulmonary:     Effort: Pulmonary effort is normal. No respiratory distress.  Musculoskeletal:        General: Normal range of motion.  Skin:    General: Skin is warm and dry.  Neurological:     General: No focal deficit present.     Mental Status: She is alert and oriented to person, place, and time.  Psychiatric:        Mood and Affect: Mood normal.        Behavior: Behavior normal. Behavior is cooperative.      UC Treatments / Results  Labs (all labs ordered are listed, but only abnormal results are displayed) Labs Reviewed - No data to display  EKG   Radiology No results found.  Procedures Procedures (including critical care time)  Medications Ordered in UC Medications - No data to display  Initial Impression / Assessment and Plan / UC Course  I have reviewed the triage vital signs and the nursing notes.  Pertinent labs & imaging results that were available during my care of the patient were reviewed by me and considered in my medical decision making (see chart for details).  Vitals and triage reviewed, patient is hemodynamically stable.  No current epistaxis while in waiting room or in clinic, around an hour in total.  Posterior pharynx reveals no blood.  Nasal deformity from previous breaks from falls, suspect this may have played into epistaxis.  Symptomatic management for nosebleed  discussed, prevention also reviewed.  Plan of care, follow-up care return precautions given, no questions at this time.  ENT follow-up encouraged if this issue persists.     Final Clinical Impressions(s) / UC Diagnoses   Final diagnoses:  Epistaxis     Discharge Instructions      If you develop another nosebleed please lean forward and hold pressure for 15 to 20 minutes. You can use the Afrin as needed, do not use this for longer than 3 days in a row as it can cause rebound congestion. Please make sure you are nose is not too dry, you can swab it with Aquaphor or Vaseline to keep it moist.  Sleeping with a humidifier may help moisten the air.  Seek immediate care for nosebleed lasting longer than 20 minutes even with direct pressure or recurrent nosebleeds.  I suggest following up with your ENT in case you need cautery.  Return to clinic for any new or urgent symptoms.     ED Prescriptions   None    PDMP not reviewed this encounter.   Tessica Cupo, [place], [place] 09/10/23 [phone]

## 2023-09-10 NOTE — ED Triage Notes (Signed)
Patient blew her nose around 1130 and nose started to bleed. Patient has hx of afib and takes eliquis. Had pelvic fracture surgery in November. Bleeding controlled on arrival to Urgent care.

## 2023-09-11 ENCOUNTER — Ambulatory Visit: Payer: Medicare Other

## 2023-09-12 ENCOUNTER — Other Ambulatory Visit: Payer: Medicare Other

## 2023-09-12 ENCOUNTER — Ambulatory Visit: Payer: Medicare Other | Admitting: Oncology

## 2023-09-12 ENCOUNTER — Inpatient Hospital Stay: Payer: Medicare Other

## 2023-09-12 VITALS — BP 123/64 | HR 81 | Temp 98.6°F | Resp 18

## 2023-09-12 DIAGNOSIS — C50912 Malignant neoplasm of unspecified site of left female breast: Secondary | ICD-10-CM

## 2023-09-12 DIAGNOSIS — S32401D Unspecified fracture of right acetabulum, subsequent encounter for fracture with routine healing: Secondary | ICD-10-CM | POA: Diagnosis not present

## 2023-09-12 DIAGNOSIS — Z1721 Progesterone receptor positive status: Secondary | ICD-10-CM | POA: Diagnosis not present

## 2023-09-12 DIAGNOSIS — Z5112 Encounter for antineoplastic immunotherapy: Secondary | ICD-10-CM | POA: Diagnosis not present

## 2023-09-12 DIAGNOSIS — C7800 Secondary malignant neoplasm of unspecified lung: Secondary | ICD-10-CM | POA: Diagnosis not present

## 2023-09-12 DIAGNOSIS — Z170 Estrogen receptor positive status [ER+]: Secondary | ICD-10-CM | POA: Diagnosis not present

## 2023-09-12 DIAGNOSIS — C787 Secondary malignant neoplasm of liver and intrahepatic bile duct: Secondary | ICD-10-CM | POA: Diagnosis not present

## 2023-09-12 DIAGNOSIS — D509 Iron deficiency anemia, unspecified: Secondary | ICD-10-CM

## 2023-09-12 DIAGNOSIS — M79604 Pain in right leg: Secondary | ICD-10-CM | POA: Diagnosis not present

## 2023-09-12 MED ORDER — IRON SUCROSE 20 MG/ML IV SOLN
200.0000 mg | Freq: Once | INTRAVENOUS | Status: AC
  Administered 2023-09-12: 200 mg via INTRAVENOUS
  Filled 2023-09-12: qty 10

## 2023-09-12 NOTE — Patient Instructions (Signed)

## 2023-09-13 ENCOUNTER — Inpatient Hospital Stay: Payer: Medicare Other

## 2023-09-13 ENCOUNTER — Ambulatory Visit: Payer: Medicare Other

## 2023-09-13 DIAGNOSIS — C7800 Secondary malignant neoplasm of unspecified lung: Secondary | ICD-10-CM | POA: Diagnosis not present

## 2023-09-13 DIAGNOSIS — S32431D Displaced fracture of anterior column [iliopubic] of right acetabulum, subsequent encounter for fracture with routine healing: Secondary | ICD-10-CM | POA: Diagnosis not present

## 2023-09-13 DIAGNOSIS — C50919 Malignant neoplasm of unspecified site of unspecified female breast: Secondary | ICD-10-CM | POA: Diagnosis not present

## 2023-09-13 DIAGNOSIS — E039 Hypothyroidism, unspecified: Secondary | ICD-10-CM | POA: Diagnosis not present

## 2023-09-13 DIAGNOSIS — C787 Secondary malignant neoplasm of liver and intrahepatic bile duct: Secondary | ICD-10-CM | POA: Diagnosis not present

## 2023-09-13 DIAGNOSIS — I4891 Unspecified atrial fibrillation: Secondary | ICD-10-CM | POA: Diagnosis not present

## 2023-09-14 ENCOUNTER — Encounter: Payer: Self-pay | Admitting: Oncology

## 2023-09-14 ENCOUNTER — Ambulatory Visit: Payer: Medicare Other

## 2023-09-14 ENCOUNTER — Inpatient Hospital Stay: Payer: Medicare Other

## 2023-09-14 ENCOUNTER — Telehealth: Payer: Self-pay

## 2023-09-14 NOTE — Telephone Encounter (Signed)
Pt called, wanted to give you a heads up on need for CT tibia. Pt states that Dr Caryn Bee Haddix, (trauma surgeon, who repaired her right acetabulum in Nov) "saw something on her bone, which he felt was unusual for lower extremity" per pt. It concerned him as well as [place]. She has a prescription or something that reads "CT of tibia without contrast". She would like to have the scan done here, and wanted to know if we could help her to get appt. She has appt here tomorrow for iron infusion. She will bring the form/prescription with her tomorrow.

## 2023-09-15 ENCOUNTER — Ambulatory Visit: Payer: Medicare Other

## 2023-09-15 ENCOUNTER — Inpatient Hospital Stay: Payer: Medicare Other

## 2023-09-15 ENCOUNTER — Encounter: Payer: Self-pay | Admitting: Oncology

## 2023-09-15 ENCOUNTER — Ambulatory Visit (HOSPITAL_BASED_OUTPATIENT_CLINIC_OR_DEPARTMENT_OTHER)
Admission: RE | Admit: 2023-09-15 | Discharge: 2023-09-15 | Disposition: A | Discharge status: Home / Self Care | Payer: Medicare Other | Source: Ambulatory Visit | Attending: Orthopedic Surgery | Admitting: Orthopedic Surgery

## 2023-09-15 ENCOUNTER — Other Ambulatory Visit: Payer: Self-pay

## 2023-09-15 ENCOUNTER — Other Ambulatory Visit (HOSPITAL_BASED_OUTPATIENT_CLINIC_OR_DEPARTMENT_OTHER): Payer: Self-pay | Admitting: Orthopedic Surgery

## 2023-09-15 VITALS — BP 155/74 | HR 87 | Temp 98.0°F | Resp 18

## 2023-09-15 DIAGNOSIS — C50912 Malignant neoplasm of unspecified site of left female breast: Secondary | ICD-10-CM | POA: Diagnosis not present

## 2023-09-15 DIAGNOSIS — M899 Disorder of bone, unspecified: Secondary | ICD-10-CM

## 2023-09-15 DIAGNOSIS — M1711 Unilateral primary osteoarthritis, right knee: Secondary | ICD-10-CM | POA: Diagnosis not present

## 2023-09-15 DIAGNOSIS — Z1721 Progesterone receptor positive status: Secondary | ICD-10-CM | POA: Diagnosis not present

## 2023-09-15 DIAGNOSIS — C7981 Secondary malignant neoplasm of breast: Secondary | ICD-10-CM | POA: Diagnosis not present

## 2023-09-15 DIAGNOSIS — C787 Secondary malignant neoplasm of liver and intrahepatic bile duct: Secondary | ICD-10-CM | POA: Diagnosis not present

## 2023-09-15 DIAGNOSIS — M8580 Other specified disorders of bone density and structure, unspecified site: Secondary | ICD-10-CM | POA: Diagnosis not present

## 2023-09-15 DIAGNOSIS — Z5112 Encounter for antineoplastic immunotherapy: Secondary | ICD-10-CM | POA: Diagnosis not present

## 2023-09-15 DIAGNOSIS — D509 Iron deficiency anemia, unspecified: Secondary | ICD-10-CM

## 2023-09-15 DIAGNOSIS — Z170 Estrogen receptor positive status [ER+]: Secondary | ICD-10-CM | POA: Diagnosis not present

## 2023-09-15 DIAGNOSIS — C7800 Secondary malignant neoplasm of unspecified lung: Secondary | ICD-10-CM | POA: Diagnosis not present

## 2023-09-15 DIAGNOSIS — M19071 Primary osteoarthritis, right ankle and foot: Secondary | ICD-10-CM | POA: Diagnosis not present

## 2023-09-15 MED ORDER — IRON SUCROSE 20 MG/ML IV SOLN
200.0000 mg | Freq: Once | INTRAVENOUS | Status: AC
  Administered 2023-09-15: 200 mg via INTRAVENOUS
  Filled 2023-09-15: qty 10

## 2023-09-15 MED ORDER — SODIUM CHLORIDE 0.9 % IV SOLN
INTRAVENOUS | Status: DC
Start: 2023-09-15 — End: 2023-09-15

## 2023-09-15 NOTE — Patient Instructions (Signed)

## 2023-09-18 ENCOUNTER — Ambulatory Visit: Payer: Medicare Other

## 2023-09-18 ENCOUNTER — Ambulatory Visit: Payer: Medicare Other | Admitting: Cardiology

## 2023-09-19 ENCOUNTER — Inpatient Hospital Stay: Payer: Medicare Other

## 2023-09-19 ENCOUNTER — Encounter: Payer: Self-pay | Admitting: Oncology

## 2023-09-19 VITALS — BP 143/89 | HR 81 | Temp 98.4°F | Resp 18

## 2023-09-19 DIAGNOSIS — C50912 Malignant neoplasm of unspecified site of left female breast: Secondary | ICD-10-CM | POA: Diagnosis not present

## 2023-09-19 DIAGNOSIS — Z5112 Encounter for antineoplastic immunotherapy: Secondary | ICD-10-CM | POA: Diagnosis not present

## 2023-09-19 DIAGNOSIS — Z170 Estrogen receptor positive status [ER+]: Secondary | ICD-10-CM | POA: Diagnosis not present

## 2023-09-19 DIAGNOSIS — C7800 Secondary malignant neoplasm of unspecified lung: Secondary | ICD-10-CM | POA: Diagnosis not present

## 2023-09-19 DIAGNOSIS — C787 Secondary malignant neoplasm of liver and intrahepatic bile duct: Secondary | ICD-10-CM | POA: Diagnosis not present

## 2023-09-19 DIAGNOSIS — Z1721 Progesterone receptor positive status: Secondary | ICD-10-CM | POA: Diagnosis not present

## 2023-09-19 DIAGNOSIS — D509 Iron deficiency anemia, unspecified: Secondary | ICD-10-CM

## 2023-09-19 MED ORDER — IRON SUCROSE 20 MG/ML IV SOLN
200.0000 mg | Freq: Once | INTRAVENOUS | Status: AC
Start: 2023-09-19 — End: 2023-09-19
  Administered 2023-09-19: 200 mg via INTRAVENOUS
  Filled 2023-09-19: qty 10

## 2023-09-19 NOTE — Patient Instructions (Signed)

## 2023-09-20 DIAGNOSIS — S32431D Displaced fracture of anterior column [iliopubic] of right acetabulum, subsequent encounter for fracture with routine healing: Secondary | ICD-10-CM | POA: Diagnosis not present

## 2023-09-20 DIAGNOSIS — C787 Secondary malignant neoplasm of liver and intrahepatic bile duct: Secondary | ICD-10-CM | POA: Diagnosis not present

## 2023-09-20 DIAGNOSIS — C7800 Secondary malignant neoplasm of unspecified lung: Secondary | ICD-10-CM | POA: Diagnosis not present

## 2023-09-20 DIAGNOSIS — E039 Hypothyroidism, unspecified: Secondary | ICD-10-CM | POA: Diagnosis not present

## 2023-09-20 DIAGNOSIS — I4891 Unspecified atrial fibrillation: Secondary | ICD-10-CM | POA: Diagnosis not present

## 2023-09-20 DIAGNOSIS — C50919 Malignant neoplasm of unspecified site of unspecified female breast: Secondary | ICD-10-CM | POA: Diagnosis not present

## 2023-09-21 ENCOUNTER — Inpatient Hospital Stay: Payer: Medicare Other

## 2023-09-21 VITALS — BP 144/85 | HR 81 | Temp 98.6°F | Resp 18

## 2023-09-21 DIAGNOSIS — Z170 Estrogen receptor positive status [ER+]: Secondary | ICD-10-CM | POA: Diagnosis not present

## 2023-09-21 DIAGNOSIS — Z1721 Progesterone receptor positive status: Secondary | ICD-10-CM | POA: Diagnosis not present

## 2023-09-21 DIAGNOSIS — Z5112 Encounter for antineoplastic immunotherapy: Secondary | ICD-10-CM | POA: Diagnosis not present

## 2023-09-21 DIAGNOSIS — C50912 Malignant neoplasm of unspecified site of left female breast: Secondary | ICD-10-CM | POA: Diagnosis not present

## 2023-09-21 DIAGNOSIS — C7800 Secondary malignant neoplasm of unspecified lung: Secondary | ICD-10-CM | POA: Diagnosis not present

## 2023-09-21 DIAGNOSIS — E039 Hypothyroidism, unspecified: Secondary | ICD-10-CM | POA: Diagnosis not present

## 2023-09-21 DIAGNOSIS — D509 Iron deficiency anemia, unspecified: Secondary | ICD-10-CM

## 2023-09-21 DIAGNOSIS — C787 Secondary malignant neoplasm of liver and intrahepatic bile duct: Secondary | ICD-10-CM | POA: Diagnosis not present

## 2023-09-21 DIAGNOSIS — I4891 Unspecified atrial fibrillation: Secondary | ICD-10-CM | POA: Diagnosis not present

## 2023-09-21 DIAGNOSIS — S32431D Displaced fracture of anterior column [iliopubic] of right acetabulum, subsequent encounter for fracture with routine healing: Secondary | ICD-10-CM | POA: Diagnosis not present

## 2023-09-21 DIAGNOSIS — C50919 Malignant neoplasm of unspecified site of unspecified female breast: Secondary | ICD-10-CM | POA: Diagnosis not present

## 2023-09-21 MED ORDER — IRON SUCROSE 20 MG/ML IV SOLN
200.0000 mg | Freq: Once | INTRAVENOUS | Status: AC
  Administered 2023-09-21: 200 mg via INTRAVENOUS
  Filled 2023-09-21: qty 10

## 2023-09-21 MED ORDER — SODIUM CHLORIDE 0.9 % IV SOLN: INTRAVENOUS | Status: DC

## 2023-09-21 NOTE — Patient Instructions (Signed)

## 2023-09-22 DIAGNOSIS — G8929 Other chronic pain: Secondary | ICD-10-CM | POA: Diagnosis not present

## 2023-09-22 DIAGNOSIS — M47812 Spondylosis without myelopathy or radiculopathy, cervical region: Secondary | ICD-10-CM | POA: Diagnosis not present

## 2023-09-22 DIAGNOSIS — E039 Hypothyroidism, unspecified: Secondary | ICD-10-CM | POA: Diagnosis not present

## 2023-09-22 DIAGNOSIS — G4700 Insomnia, unspecified: Secondary | ICD-10-CM | POA: Diagnosis not present

## 2023-09-22 DIAGNOSIS — E0500 Thyrotoxicosis with diffuse goiter without thyrotoxic crisis or storm: Secondary | ICD-10-CM | POA: Diagnosis not present

## 2023-09-22 DIAGNOSIS — Z7901 Long term (current) use of anticoagulants: Secondary | ICD-10-CM | POA: Diagnosis not present

## 2023-09-22 DIAGNOSIS — I4892 Unspecified atrial flutter: Secondary | ICD-10-CM | POA: Diagnosis not present

## 2023-09-22 DIAGNOSIS — Z8701 Personal history of pneumonia (recurrent): Secondary | ICD-10-CM | POA: Diagnosis not present

## 2023-09-22 DIAGNOSIS — M25512 Pain in left shoulder: Secondary | ICD-10-CM | POA: Diagnosis not present

## 2023-09-22 DIAGNOSIS — M80051D Age-related osteoporosis with current pathological fracture, right femur, subsequent encounter for fracture with routine healing: Secondary | ICD-10-CM | POA: Diagnosis not present

## 2023-09-22 DIAGNOSIS — I4891 Unspecified atrial fibrillation: Secondary | ICD-10-CM | POA: Diagnosis not present

## 2023-09-22 DIAGNOSIS — M4802 Spinal stenosis, cervical region: Secondary | ICD-10-CM | POA: Diagnosis not present

## 2023-09-22 DIAGNOSIS — E785 Hyperlipidemia, unspecified: Secondary | ICD-10-CM | POA: Diagnosis not present

## 2023-09-22 DIAGNOSIS — Z87891 Personal history of nicotine dependence: Secondary | ICD-10-CM | POA: Diagnosis not present

## 2023-09-22 DIAGNOSIS — Z79891 Long term (current) use of opiate analgesic: Secondary | ICD-10-CM | POA: Diagnosis not present

## 2023-09-22 DIAGNOSIS — M4135 Thoracogenic scoliosis, thoracolumbar region: Secondary | ICD-10-CM | POA: Diagnosis not present

## 2023-09-22 DIAGNOSIS — C7800 Secondary malignant neoplasm of unspecified lung: Secondary | ICD-10-CM | POA: Diagnosis not present

## 2023-09-22 DIAGNOSIS — K219 Gastro-esophageal reflux disease without esophagitis: Secondary | ICD-10-CM | POA: Diagnosis not present

## 2023-09-22 DIAGNOSIS — C50919 Malignant neoplasm of unspecified site of unspecified female breast: Secondary | ICD-10-CM | POA: Diagnosis not present

## 2023-09-22 DIAGNOSIS — C787 Secondary malignant neoplasm of liver and intrahepatic bile duct: Secondary | ICD-10-CM | POA: Diagnosis not present

## 2023-09-22 DIAGNOSIS — Z981 Arthrodesis status: Secondary | ICD-10-CM | POA: Diagnosis not present

## 2023-09-22 DIAGNOSIS — K5909 Other constipation: Secondary | ICD-10-CM | POA: Diagnosis not present

## 2023-09-22 DIAGNOSIS — Z79899 Other long term (current) drug therapy: Secondary | ICD-10-CM | POA: Diagnosis not present

## 2023-09-22 DIAGNOSIS — Z8542 Personal history of malignant neoplasm of other parts of uterus: Secondary | ICD-10-CM | POA: Diagnosis not present

## 2023-09-25 NOTE — Progress Notes (Signed)
 Cardiology Office Note:  .   Date:  09/26/2023  ID:  Karen Allison, DOB 1945/12/14, MRN [id number] PCP: Jefferey Fitch, MD  [facility] Providers Cardiologist:  Redell Leiter, MD    History of Present Illness: Karen   Karen Allison is a 78 y.o. female with a past medical history of atrial fibrillation, hypertension, hepatitis, hypothyroidism, history of endometrial cancer, history of breast cancer stage IV with mets to liver and lung.  04/05/23 echo EF 50-55%, moderately elevated PASP, mild MR 01/10/23 echo EF 55-60%, trivial MR  Most recently evaluated by Dr. Leiter on 06/16/2023, she was started on reduced dose Eliquis , maintaining sinus rhythm, she was advised to follow-up in 3 months.  In November 2024 she suffered a fall resulting in an acetabular fracture.  He had a prolonged hospitalization, completed a inpatient rehab and was ultimately discharged home.  She continues to work with PT at home.  She is in a wheelchair today however she typically ambulates around her house with a walker.  She is somewhat frustrated at how slowly she has been progressing.  She is tolerating her Eliquis  without any adverse side effects.  No recurrent falls. She denies chest pain, palpitations, dyspnea, pnd, orthopnea, n, v, dizziness, syncope, edema, weight gain, or early satiety.   ROS: Review of Systems  Constitutional:  Positive for malaise/fatigue.  Musculoskeletal:  Positive for myalgias.  All other systems reviewed and are negative.   Studies Reviewed: .        Cardiac Studies & Procedures      ECHOCARDIOGRAM  ECHOCARDIOGRAM COMPLETE 09/26/2023  Narrative ECHOCARDIOGRAM REPORT    Patient Name:   Karen Allison [facility] Date of Exam: 09/26/2023 Medical Rec #:  [id number]             Height:       61.0 in Accession #:    [id number]            Weight:       131.2 lb Date of Birth:  [birth year]              BSA:          1.579 m Patient Age:    77 years              BP:            150/80 mmHg Patient Gender: F                     HR:           93 bpm. Exam Location:    Procedure: 2D Echo, Cardiac Doppler and Color Doppler  Indications:    Longstanding persistent atrial fibrillation (HCC) [I48.11 (ICD-10-CM)]; Primary hypertension [I10 (ICD-10-CM)]; Hyponatremia [E87.1 (ICD-10-CM)]  History:        Patient has prior history of Echocardiogram examinations, most recent 04/05/2023. Arrythmias:Atrial Fibrillation; Risk Factors:Hypertension. Malignant neoplasm of left breast in female, estrogen receptor positive, unspecified site of breast (HCC) [C50.912, Z17.0 (ICD-10-CM)].  Sonographer:    Charlie Jointer RDCS Referring Phys: [id number] REDELL JINNY LEITER   Sonographer Comments: Restricted mobility. Global longitudinal strain was attempted. IMPRESSIONS   1. Atrial fibrillation with a controlled rate . Left ventricular ejection fraction, by estimation, is 55 to 60%. The left ventricle has normal function. The left ventricle has no regional wall motion abnormalities. Left ventricular diastolic parameters are indeterminate. Elevated left ventricular end-diastolic pressure. 2. Right ventricular systolic function is normal. The right ventricular size is  normal. There is moderately elevated pulmonary artery systolic pressure. 3. The mitral valve is normal in structure. Mild mitral valve regurgitation. No evidence of mitral stenosis. 4. Tricuspid valve regurgitation is moderate. 5. The aortic valve is tricuspid. Aortic valve regurgitation is mild. Aortic valve sclerosis is present, with no evidence of aortic valve stenosis. 6. Aortic DTA is normal. 7. Elevated LA pressure . The inferior vena cava is normal in size with greater than 50% respiratory variability, suggesting right atrial pressure of 3 mmHg.  FINDINGS Left Ventricle: Atrial fibrillation with a controlled rate. Left ventricular ejection fraction, by estimation, is 55 to 60%. The left ventricle has normal  function. The left ventricle has no regional wall motion abnormalities. The left ventricular internal cavity size was normal in size. There is no left ventricular hypertrophy. Left ventricular diastolic parameters are indeterminate. Elevated left ventricular end-diastolic pressure.  Right Ventricle: The right ventricular size is normal. No increase in right ventricular wall thickness. Right ventricular systolic function is normal. There is moderately elevated pulmonary artery systolic pressure. The tricuspid regurgitant velocity is 3.28 m/s, and with an assumed right atrial pressure of 3 mmHg, the estimated right ventricular systolic pressure is 46.0 mmHg.  Left Atrium: Left atrial size was normal in size.  Right Atrium: Right atrial size was normal in size.  Pericardium: There is no evidence of pericardial effusion.  Mitral Valve: The mitral valve is normal in structure. Mild mitral valve regurgitation. No evidence of mitral valve stenosis.  Tricuspid Valve: The tricuspid valve is normal in structure. Tricuspid valve regurgitation is moderate . No evidence of tricuspid stenosis.  Aortic Valve: The aortic valve is tricuspid. Aortic valve regurgitation is mild. Aortic regurgitation PHT measures 590 msec. Aortic valve sclerosis is present, with no evidence of aortic valve stenosis.  Pulmonic Valve: The pulmonic valve was normal in structure. Pulmonic valve regurgitation is trivial. No evidence of pulmonic stenosis.  Aorta: DTA is normal, the aortic arch was not well visualized and the aortic root and ascending aorta are structurally normal, with no evidence of dilitation.  Venous: Elevated LA pressure. A systolic blunting flow pattern is recorded from the right upper pulmonary vein. The inferior vena cava is normal in size with greater than 50% respiratory variability, suggesting right atrial pressure of 3 mmHg.  IAS/Shunts: No atrial level shunt detected by color flow Doppler.   LEFT  VENTRICLE PLAX 2D LVIDd:         4.20 cm   Diastology LVIDs:         3.00 cm   LV e' medial:    9.79 cm/s LV PW:         0.90 cm   LV E/e' medial:  11.7 LV IVS:        0.90 cm   LV e' lateral:   7.83 cm/s LVOT diam:     1.80 cm   LV E/e' lateral: 14.6 LV SV:         23 LV SV Index:   15 LVOT Area:     2.54 cm   RIGHT VENTRICLE             IVC RV Basal diam:  2.50 cm     IVC diam: 1.80 cm RV Mid diam:    2.10 cm RV S prime:     11.70 cm/s TAPSE (M-mode): 1.9 cm  LEFT ATRIUM             Index        RIGHT ATRIUM  Index LA diam:        3.00 cm 1.90 cm/m   RA Area:     13.50 cm LA Vol (A2C):   34.3 ml 21.73 ml/m  RA Volume:   27.60 ml  17.48 ml/m LA Vol (A4C):   45.7 ml 28.95 ml/m LA Biplane Vol: 40.1 ml 25.40 ml/m AORTIC VALVE                   PULMONIC VALVE LVOT Vmax:         58.77 cm/s  PR End Diast Vel: 3.88 msec LVOT Vmean:        40.400 cm/s LVOT VTI:          0.091 m AI PHT:            590 msec AR Vena Contracta: 0.20 cm  AORTA Ao Root diam: 3.10 cm Ao Asc diam:  2.90 cm Ao Desc diam: 2.00 cm  MITRAL VALVE                TRICUSPID VALVE MV Area (PHT): 5.14 cm     TR Peak grad:   43.0 mmHg MV Decel Time: 148 msec     TR Vmax:        328.00 cm/s MV E velocity: 114.50 cm/s MV A velocity: 51.90 cm/s   SHUNTS MV E/A ratio:  2.21         Systemic VTI:  0.09 m Systemic Diam: 1.80 cm  Redell Leiter MD Electronically signed by Redell Leiter MD Signature Date/Time: 09/26/2023/2:58:11 PM    Final             Risk Assessment/Calculations:    CHA2DS2-VASc Score = 4   This indicates a 4.8% annual risk of stroke. The patient's score is based upon: CHF History: 0 HTN History: 1 Diabetes History: 0 Stroke History: 0 Vascular Disease History: 0 Age Score: 2 Gender Score: 1         Physical Exam:   VS:  BP (!) 150/80 (BP Location: Right Arm, Patient Position: Sitting, Cuff Size: Normal)   Pulse 93   Ht 5' 1 (1.549 m)   Wt 129 lb (58.5 kg)   SpO2  96%   BMI 24.37 kg/m    Wt Readings from Last 3 Encounters:  09/26/23 129 lb (58.5 kg)  07/19/23 131 lb 2.8 oz (59.5 kg)  07/02/23 129 lb 10.1 oz (58.8 kg)    GEN: Well nourished, well developed in no acute distress NECK: No JVD; No carotid bruits CARDIAC: RRR, no murmurs, rubs, gallops RESPIRATORY:  Clear to auscultation without rales, wheezing or rhonchi  ABDOMEN: Soft, non-tender, non-distended EXTREMITIES:  No edema; No deformity   ASSESSMENT AND PLAN: .   Atrial fibrillation/hypercoagulable state-CHA2DS2-VASc score of 4, her heart rate is controlled today.  Continue Eliquis  2.5 mg twice daily-as she is on reduced dose secondary to her primary cardiologist and her shared decision making secondary to frailty and propensity to falls.  Continue metoprolol  25 mg daily.  She did have an episode of epistaxis however any further episodes of adverse bleeding effects.    Iron  deficiency anemia-follows with the cancer center and Dr. Cornelius.  Hypertension -blood pressure is elevated today at 150/80 however in light of her recent fall and other comorbid conditions we should likely allow for permissive hypertension.  Continue metoprolol  25 mg daily.  Metastatic breast cancer-we have been repeating echocardiograms, August 2024 her echo revealed echo EF 50-55%, moderately elevated PASP, mild MR.  Dispo: Follow-up with Dr. Monetta in 3 to 4 months  Signed, Delon JAYSON Hoover, NP

## 2023-09-26 ENCOUNTER — Encounter: Payer: Self-pay | Admitting: *Deleted

## 2023-09-26 ENCOUNTER — Ambulatory Visit (INDEPENDENT_AMBULATORY_CARE_PROVIDER_SITE_OTHER): Payer: Medicare Other | Admitting: Cardiology

## 2023-09-26 ENCOUNTER — Ambulatory Visit: Payer: Medicare Other | Attending: Cardiology

## 2023-09-26 VITALS — BP 150/80 | HR 93 | Ht 61.0 in | Wt 129.0 lb

## 2023-09-26 DIAGNOSIS — C50919 Malignant neoplasm of unspecified site of unspecified female breast: Secondary | ICD-10-CM | POA: Diagnosis not present

## 2023-09-26 DIAGNOSIS — D509 Iron deficiency anemia, unspecified: Secondary | ICD-10-CM | POA: Insufficient documentation

## 2023-09-26 DIAGNOSIS — I10 Essential (primary) hypertension: Secondary | ICD-10-CM

## 2023-09-26 DIAGNOSIS — I4811 Longstanding persistent atrial fibrillation: Secondary | ICD-10-CM

## 2023-09-26 DIAGNOSIS — E871 Hypo-osmolality and hyponatremia: Secondary | ICD-10-CM | POA: Diagnosis not present

## 2023-09-26 DIAGNOSIS — D6859 Other primary thrombophilia: Secondary | ICD-10-CM

## 2023-09-26 LAB — ECHOCARDIOGRAM COMPLETE
AV Vena cont: 0.2 cm
Area-P 1/2: 5.14 cm2
P 1/2 time: 590 ms
S' Lateral: 3 cm

## 2023-09-26 NOTE — Patient Instructions (Signed)
Medication Instructions:  Your physician recommends that you continue on your current medications as directed. Please refer to the Current Medication list given to you today.  *If you need a refill on your cardiac medications before your next appointment, please call your pharmacy*   Lab Work: None ordered If you have labs (blood work) drawn today and your tests are completely normal, you will receive your results only by: MyChart Message (if you have MyChart) OR A paper copy in the mail If you have any lab test that is abnormal or we need to change your treatment, we will call you to review the results.   Testing/Procedures: None ordered   Follow-Up: At [facility], you and your health needs are our priority.  As part of our continuing mission to provide you with exceptional heart care, we have created designated Provider Care Teams.  These Care Teams include your primary Cardiologist (physician) and Advanced Practice Providers (APPs -  Physician Assistants and Nurse Practitioners) who all work together to provide you with the care you need, when you need it.  We recommend signing up for the patient portal called "MyChart".  Sign up information is provided on this After Visit Summary.  MyChart is used to connect with patients for Virtual Visits (Telemedicine).  Patients are able to view lab/test results, encounter notes, upcoming appointments, etc.  Non-urgent messages can be sent to your provider as well.   To learn more about what you can do with MyChart, go to [email or web address].    Your next appointment:   4 month(s)  The format for your next appointment:   In Person  Provider:   Norman Herrlich, MD    Other Instructions none  Important Information About Sugar

## 2023-09-27 ENCOUNTER — Other Ambulatory Visit: Payer: Self-pay

## 2023-09-27 DIAGNOSIS — M80051D Age-related osteoporosis with current pathological fracture, right femur, subsequent encounter for fracture with routine healing: Secondary | ICD-10-CM | POA: Diagnosis not present

## 2023-09-27 DIAGNOSIS — I4892 Unspecified atrial flutter: Secondary | ICD-10-CM | POA: Diagnosis not present

## 2023-09-27 DIAGNOSIS — C50919 Malignant neoplasm of unspecified site of unspecified female breast: Secondary | ICD-10-CM | POA: Diagnosis not present

## 2023-09-27 DIAGNOSIS — C787 Secondary malignant neoplasm of liver and intrahepatic bile duct: Secondary | ICD-10-CM | POA: Diagnosis not present

## 2023-09-27 DIAGNOSIS — C7800 Secondary malignant neoplasm of unspecified lung: Secondary | ICD-10-CM | POA: Diagnosis not present

## 2023-09-27 DIAGNOSIS — I4891 Unspecified atrial fibrillation: Secondary | ICD-10-CM | POA: Diagnosis not present

## 2023-09-28 DIAGNOSIS — I4892 Unspecified atrial flutter: Secondary | ICD-10-CM | POA: Diagnosis not present

## 2023-09-28 DIAGNOSIS — M80051D Age-related osteoporosis with current pathological fracture, right femur, subsequent encounter for fracture with routine healing: Secondary | ICD-10-CM | POA: Diagnosis not present

## 2023-09-28 DIAGNOSIS — C50919 Malignant neoplasm of unspecified site of unspecified female breast: Secondary | ICD-10-CM | POA: Diagnosis not present

## 2023-09-28 DIAGNOSIS — I4891 Unspecified atrial fibrillation: Secondary | ICD-10-CM | POA: Diagnosis not present

## 2023-09-28 DIAGNOSIS — C787 Secondary malignant neoplasm of liver and intrahepatic bile duct: Secondary | ICD-10-CM | POA: Diagnosis not present

## 2023-09-28 DIAGNOSIS — C7800 Secondary malignant neoplasm of unspecified lung: Secondary | ICD-10-CM | POA: Diagnosis not present

## 2023-09-29 ENCOUNTER — Inpatient Hospital Stay: Payer: Medicare Other | Attending: Oncology

## 2023-09-29 ENCOUNTER — Telehealth: Payer: Self-pay | Admitting: Oncology

## 2023-09-29 ENCOUNTER — Other Ambulatory Visit: Payer: Self-pay | Admitting: Oncology

## 2023-09-29 VITALS — BP 128/77 | HR 85 | Temp 97.7°F | Resp 18 | Ht 61.0 in | Wt 129.0 lb

## 2023-09-29 DIAGNOSIS — Z79899 Other long term (current) drug therapy: Secondary | ICD-10-CM | POA: Diagnosis not present

## 2023-09-29 DIAGNOSIS — C787 Secondary malignant neoplasm of liver and intrahepatic bile duct: Secondary | ICD-10-CM | POA: Insufficient documentation

## 2023-09-29 DIAGNOSIS — Z5112 Encounter for antineoplastic immunotherapy: Secondary | ICD-10-CM | POA: Diagnosis not present

## 2023-09-29 DIAGNOSIS — Z79620 Long term (current) use of immunosuppressive biologic: Secondary | ICD-10-CM | POA: Insufficient documentation

## 2023-09-29 DIAGNOSIS — Z170 Estrogen receptor positive status [ER+]: Secondary | ICD-10-CM | POA: Diagnosis not present

## 2023-09-29 DIAGNOSIS — C50912 Malignant neoplasm of unspecified site of left female breast: Secondary | ICD-10-CM

## 2023-09-29 MED ORDER — SODIUM CHLORIDE 0.9 % IV SOLN: Freq: Once | INTRAVENOUS | Status: AC

## 2023-09-29 MED ORDER — TRASTUZUMAB-ANNS CHEMO 420 MG IV SOLR
6.0000 mg/kg | Freq: Once | INTRAVENOUS | Status: AC
  Administered 2023-09-29: 378 mg via INTRAVENOUS
  Filled 2023-09-29: qty 18

## 2023-09-29 NOTE — Telephone Encounter (Signed)
 Contacted pt to schedule next set of appts. Unable to reach via phone, voicemail was left.

## 2023-09-29 NOTE — Patient Instructions (Signed)
 Trastuzumab Injection What is this medication? TRASTUZUMAB (tras TOO zoo mab) treats breast cancer and stomach cancer. It works by blocking a protein that causes cancer cells to grow and multiply. This helps to slow or stop the spread of cancer cells. This medicine may be used for other purposes; ask your health care provider or pharmacist if you have questions. COMMON BRAND NAME(S): Herceptin, HERCESSI, Herzuma, KANJINTI, Ogivri, Ontruzant, Trazimera What should I tell my care team before I take this medication? They need to know if you have any of these conditions: Heart failure Lung disease An unusual or allergic reaction to trastuzumab, other medications, foods, dyes, or preservatives Pregnant or trying to get pregnant Breast-feeding How should I use this medication? This medication is injected into a vein. It is given by your care team in a hospital or clinic setting. Talk to your care team about the use of this medication in children. It is not approved for use in children. Overdosage: If you think you have taken too much of this medicine contact a poison control center or emergency room at once. NOTE: This medicine is only for you. Do not share this medicine with others. What if I miss a dose? Keep appointments for follow-up doses. It is important not to miss your dose. Call your care team if you are unable to keep an appointment. What may interact with this medication? Certain types of chemotherapy, such as daunorubicin, doxorubicin, epirubicin, idarubicin This list may not describe all possible interactions. Give your health care provider a list of all the medicines, herbs, non-prescription drugs, or dietary supplements you use. Also tell them if you smoke, drink alcohol, or use illegal drugs. Some items may interact with your medicine. What should I watch for while using this medication? Your condition will be monitored carefully while you are receiving this medication. This  medication may make you feel generally unwell. This is not uncommon, as chemotherapy affects healthy cells as well as cancer cells. Report any side effects. Continue your course of treatment even though you feel ill unless your care team tells you to stop. This medication may increase your risk of getting an infection. Call your care team for advice if you get a fever, chills, sore throat, or other symptoms of a cold or flu. Do not treat yourself. Try to avoid being around people who are sick. Avoid taking medications that contain aspirin, acetaminophen, ibuprofen, naproxen, or ketoprofen unless instructed by your care team. These medications can hide a fever. Talk to your care team if you may be pregnant. Serious birth defects can occur if you take this medication during pregnancy and for 7 months after the last dose. You will need a negative pregnancy test before starting this medication. Contraception is recommended while taking this medication and for 7 months after the last dose. Your care team can help you find the option that works for you. Do not breastfeed while taking this medication and for 7 months after stopping treatment. What side effects may I notice from receiving this medication? Side effects that you should report to your care team as soon as possible: Allergic reactions or angioedema--skin rash, itching or hives, swelling of the face, eyes, lips, tongue, arms, or legs, trouble swallowing or breathing Dry cough, shortness of breath or trouble breathing Heart failure--shortness of breath, swelling of the ankles, feet, or hands, sudden weight gain, unusual weakness or fatigue Infection--fever, chills, cough, or sore throat Infusion reactions--chest pain, shortness of breath or trouble breathing, feeling faint  or lightheaded Side effects that usually do not require medical attention (report to your care team if they continue or are  bothersome): Diarrhea Dizziness Headache Nausea Trouble sleeping Vomiting This list may not describe all possible side effects. Call your doctor for medical advice about side effects. You may report side effects to FDA at 1-800-FDA-1088. Where should I keep my medication? This medication is given in a hospital or clinic. It will not be stored at home. NOTE: This sheet is a summary. It may not cover all possible information. If you have questions about this medicine, talk to your doctor, pharmacist, or health care provider.  2024 Elsevier/Gold Standard (2021-12-21 00:00:00)

## 2023-10-03 DIAGNOSIS — M79604 Pain in right leg: Secondary | ICD-10-CM | POA: Diagnosis not present

## 2023-10-03 DIAGNOSIS — S32401D Unspecified fracture of right acetabulum, subsequent encounter for fracture with routine healing: Secondary | ICD-10-CM | POA: Diagnosis not present

## 2023-10-03 DIAGNOSIS — M1711 Unilateral primary osteoarthritis, right knee: Secondary | ICD-10-CM | POA: Diagnosis not present

## 2023-10-04 ENCOUNTER — Other Ambulatory Visit: Payer: Self-pay | Admitting: Student

## 2023-10-04 DIAGNOSIS — M1610 Unilateral primary osteoarthritis, unspecified hip: Secondary | ICD-10-CM

## 2023-10-06 DIAGNOSIS — I4892 Unspecified atrial flutter: Secondary | ICD-10-CM | POA: Diagnosis not present

## 2023-10-06 DIAGNOSIS — I4891 Unspecified atrial fibrillation: Secondary | ICD-10-CM | POA: Diagnosis not present

## 2023-10-06 DIAGNOSIS — M80051D Age-related osteoporosis with current pathological fracture, right femur, subsequent encounter for fracture with routine healing: Secondary | ICD-10-CM | POA: Diagnosis not present

## 2023-10-06 DIAGNOSIS — C7800 Secondary malignant neoplasm of unspecified lung: Secondary | ICD-10-CM | POA: Diagnosis not present

## 2023-10-06 DIAGNOSIS — C787 Secondary malignant neoplasm of liver and intrahepatic bile duct: Secondary | ICD-10-CM | POA: Diagnosis not present

## 2023-10-06 DIAGNOSIS — C50919 Malignant neoplasm of unspecified site of unspecified female breast: Secondary | ICD-10-CM | POA: Diagnosis not present

## 2023-10-06 DIAGNOSIS — M25551 Pain in right hip: Secondary | ICD-10-CM | POA: Insufficient documentation

## 2023-10-09 DIAGNOSIS — C7800 Secondary malignant neoplasm of unspecified lung: Secondary | ICD-10-CM | POA: Diagnosis not present

## 2023-10-09 DIAGNOSIS — I4891 Unspecified atrial fibrillation: Secondary | ICD-10-CM | POA: Diagnosis not present

## 2023-10-09 DIAGNOSIS — C787 Secondary malignant neoplasm of liver and intrahepatic bile duct: Secondary | ICD-10-CM | POA: Diagnosis not present

## 2023-10-09 DIAGNOSIS — M80051D Age-related osteoporosis with current pathological fracture, right femur, subsequent encounter for fracture with routine healing: Secondary | ICD-10-CM | POA: Diagnosis not present

## 2023-10-09 DIAGNOSIS — I4892 Unspecified atrial flutter: Secondary | ICD-10-CM | POA: Diagnosis not present

## 2023-10-09 DIAGNOSIS — C50919 Malignant neoplasm of unspecified site of unspecified female breast: Secondary | ICD-10-CM | POA: Diagnosis not present

## 2023-10-11 DIAGNOSIS — I4892 Unspecified atrial flutter: Secondary | ICD-10-CM | POA: Diagnosis not present

## 2023-10-11 DIAGNOSIS — C787 Secondary malignant neoplasm of liver and intrahepatic bile duct: Secondary | ICD-10-CM | POA: Diagnosis not present

## 2023-10-11 DIAGNOSIS — M80051D Age-related osteoporosis with current pathological fracture, right femur, subsequent encounter for fracture with routine healing: Secondary | ICD-10-CM | POA: Diagnosis not present

## 2023-10-11 DIAGNOSIS — I4891 Unspecified atrial fibrillation: Secondary | ICD-10-CM | POA: Diagnosis not present

## 2023-10-11 DIAGNOSIS — C7800 Secondary malignant neoplasm of unspecified lung: Secondary | ICD-10-CM | POA: Diagnosis not present

## 2023-10-11 DIAGNOSIS — C50919 Malignant neoplasm of unspecified site of unspecified female breast: Secondary | ICD-10-CM | POA: Diagnosis not present

## 2023-10-12 ENCOUNTER — Other Ambulatory Visit (HOSPITAL_BASED_OUTPATIENT_CLINIC_OR_DEPARTMENT_OTHER): Payer: Self-pay

## 2023-10-12 MED ORDER — HYDROCODONE-ACETAMINOPHEN 10-325 MG PO TABS
1.0000 | ORAL_TABLET | ORAL | 0 refills | Status: DC | PRN
  Filled 2023-10-12: qty 150, 25d supply, fill #0

## 2023-10-17 DIAGNOSIS — M25551 Pain in right hip: Secondary | ICD-10-CM | POA: Diagnosis not present

## 2023-10-19 DIAGNOSIS — C7800 Secondary malignant neoplasm of unspecified lung: Secondary | ICD-10-CM | POA: Diagnosis not present

## 2023-10-19 DIAGNOSIS — I4891 Unspecified atrial fibrillation: Secondary | ICD-10-CM | POA: Diagnosis not present

## 2023-10-19 DIAGNOSIS — I4892 Unspecified atrial flutter: Secondary | ICD-10-CM | POA: Diagnosis not present

## 2023-10-19 DIAGNOSIS — C50919 Malignant neoplasm of unspecified site of unspecified female breast: Secondary | ICD-10-CM | POA: Diagnosis not present

## 2023-10-19 DIAGNOSIS — C787 Secondary malignant neoplasm of liver and intrahepatic bile duct: Secondary | ICD-10-CM | POA: Diagnosis not present

## 2023-10-19 DIAGNOSIS — M80051D Age-related osteoporosis with current pathological fracture, right femur, subsequent encounter for fracture with routine healing: Secondary | ICD-10-CM | POA: Diagnosis not present

## 2023-10-20 ENCOUNTER — Inpatient Hospital Stay: Payer: Medicare Other

## 2023-10-20 VITALS — BP 153/83 | HR 75 | Temp 97.5°F | Resp 18 | Ht 61.0 in | Wt 127.2 lb

## 2023-10-20 DIAGNOSIS — Z5112 Encounter for antineoplastic immunotherapy: Secondary | ICD-10-CM | POA: Diagnosis not present

## 2023-10-20 DIAGNOSIS — C50912 Malignant neoplasm of unspecified site of left female breast: Secondary | ICD-10-CM | POA: Diagnosis not present

## 2023-10-20 DIAGNOSIS — Z170 Estrogen receptor positive status [ER+]: Secondary | ICD-10-CM | POA: Diagnosis not present

## 2023-10-20 DIAGNOSIS — Z79899 Other long term (current) drug therapy: Secondary | ICD-10-CM | POA: Diagnosis not present

## 2023-10-20 DIAGNOSIS — Z79620 Long term (current) use of immunosuppressive biologic: Secondary | ICD-10-CM | POA: Diagnosis not present

## 2023-10-20 DIAGNOSIS — C787 Secondary malignant neoplasm of liver and intrahepatic bile duct: Secondary | ICD-10-CM | POA: Diagnosis not present

## 2023-10-20 MED ORDER — HEPARIN SOD (PORK) LOCK FLUSH 100 UNIT/ML IV SOLN: 500.0000 [IU] | Freq: Once | INTRAVENOUS | Status: DC | PRN

## 2023-10-20 MED ORDER — SODIUM CHLORIDE 0.9% FLUSH: 10.0000 mL | INTRAVENOUS | Status: DC | PRN

## 2023-10-20 MED ORDER — SODIUM CHLORIDE 0.9 % IV SOLN: Freq: Once | INTRAVENOUS | Status: AC

## 2023-10-20 MED ORDER — TRASTUZUMAB-ANNS CHEMO 420 MG IV SOLR
6.0000 mg/kg | Freq: Once | INTRAVENOUS | Status: AC
  Administered 2023-10-20: 378 mg via INTRAVENOUS
  Filled 2023-10-20: qty 18

## 2023-10-20 NOTE — Patient Instructions (Signed)
 Ado-Trastuzumab Emtansine Injection What is this medication? ADO-TRASTUZUMAB EMTANSINE (ADD oh traz TOO zuh mab em TAN zine) treats breast cancer. It works by blocking a protein that causes cancer cells to grow and multiply. This helps to slow or stop the spread of cancer cells. This medicine may be used for other purposes; ask your health care provider or pharmacist if you have questions. COMMON BRAND NAME(S): Kadcyla What should I tell my care team before I take this medication? They need to know if you have any of these conditions: Heart failure Liver disease Low platelet levels Lung disease Tingling of the fingers or toes or other nerve disorder An unusual or allergic reaction to ado-trastuzumab emtansine, other medications, foods, dyes, or preservatives Pregnant or trying to get pregnant Breast-feeding How should I use this medication? This medication is infused into a vein. It is given by your care team in a hospital or clinic setting. Talk to your care team about the use of this medication in children. Special care may be needed. Overdosage: If you think you have taken too much of this medicine contact a poison control center or emergency room at once. NOTE: This medicine is only for you. Do not share this medicine with others. What if I miss a dose? Keep appointments for follow-up doses. It is important not to miss your dose. Call your care team if you are unable to keep an appointment. What may interact with this medication? Atazanavir Boceprevir Clarithromycin Dalfopristin; quinupristin Delavirdine Indinavir Isoniazid, INH Itraconazole Ketoconazole Nefazodone Nelfinavir Ritonavir Telaprevir Telithromycin Tipranavir Voriconazole This list may not describe all possible interactions. Give your health care provider a list of all the medicines, herbs, non-prescription drugs, or dietary supplements you use. Also tell them if you smoke, drink alcohol, or use illegal drugs.  Some items may interact with your medicine. What should I watch for while using this medication? This medication may make you feel generally unwell. This is not uncommon, as chemotherapy can affect healthy cells as well as cancer cells. Report any side effects. Continue your course of treatment even though you feel ill unless your care team tells you to stop. You may need blood work while taking this medication. This medication may increase your risk to bruise or bleed. Call your care team if you notice any unusual bleeding. Be careful brushing or flossing your teeth or using a toothpick because you may get an infection or bleed more easily. If you have any dental work done, tell your dentist you are receiving this medication. Talk to your care team if you may be pregnant. Serious birth defects can occur if you take this medication during pregnancy and for 7 months after the last dose. You will need a negative pregnancy test before starting this medication. Contraception is recommended while taking this medication and for 7 months after the last dose. Your care team can help you find the option that works for you. If your partner can get pregnant, use a condom during sex while taking this medication and for 4 months after the last dose. Do not breastfeed while taking this medication and for 7 months after the last dose. This medication may cause infertility. Talk to your care team if you are concerned with your fertility. What side effects may I notice from receiving this medication? Side effects that you should report to your care team as soon as possible: Allergic reactions--skin rash, itching, hives, swelling of the face, lips, tongue, or throat Bleeding--bloody or black, tar-like stools, vomiting  blood or brown material that looks like coffee grounds, red or dark brown urine, small red or purple spots on skin, unusual bruising or bleeding Dry cough, shortness of breath or trouble breathing Heart  failure--shortness of breath, swelling of the ankles, feet, or hands, sudden weight gain, unusual weakness or fatigue Infusion reactions--chest pain, shortness of breath or trouble breathing, feeling faint or lightheaded Liver injury--right upper belly pain, loss of appetite, nausea, light-colored stool, dark yellow or brown urine, yellowing skin or eyes, unusual weakness or fatigue Pain, tingling, or numbness in the hands or feet Painful swelling, warmth, or redness of the skin, blisters or sores at the infusion site Side effects that usually do not require medical attention (report to your care team if they continue or are bothersome): Constipation Fatigue Headache Muscle pain Nausea This list may not describe all possible side effects. Call your doctor for medical advice about side effects. You may report side effects to FDA at 1-800-FDA-1088. Where should I keep my medication? This medication is given in a hospital or clinic. It will not be stored at home. NOTE: This sheet is a summary. It may not cover all possible information. If you have questions about this medicine, talk to your doctor, pharmacist, or health care provider.  2024 Elsevier/Gold Standard (2021-12-24 00:00:00) [facility] [place] - A DEPT OF [facility]  Discharge Instructions: Thank you for choosing [facility] to provide your oncology and hematology care.  If you have a lab appointment with the Cancer Center, please go directly to the Cancer Center and check in at the registration area.   Wear comfortable clothing and clothing appropriate for easy access to any Portacath or PICC line.   We strive to give you quality time with your provider. You may need to reschedule your appointment if you arrive late (15 or more minutes).  Arriving late affects you and other patients whose appointments are after yours.  Also, if you miss three or more appointments without notifying the office, you  may be dismissed from the clinic at the provider's discretion.      For prescription refill requests, have your pharmacy contact our office and allow 72 hours for refills to be completed.    Today you received the following chemotherapy and/or immunotherapy agents Trastuzumab      To help prevent nausea and vomiting after your treatment, we encourage you to take your nausea medication as directed.  BELOW ARE SYMPTOMS THAT SHOULD BE REPORTED IMMEDIATELY: *FEVER GREATER THAN 100.4 F (38 C) OR HIGHER *CHILLS OR SWEATING *NAUSEA AND VOMITING THAT IS NOT CONTROLLED WITH YOUR NAUSEA MEDICATION *UNUSUAL SHORTNESS OF BREATH *UNUSUAL BRUISING OR BLEEDING *URINARY PROBLEMS (pain or burning when urinating, or frequent urination) *BOWEL PROBLEMS (unusual diarrhea, constipation, pain near the anus) TENDERNESS IN MOUTH AND THROAT WITH OR WITHOUT PRESENCE OF ULCERS (sore throat, sores in mouth, or a toothache) UNUSUAL RASH, SWELLING OR PAIN  UNUSUAL VAGINAL DISCHARGE OR ITCHING   Items with * indicate a potential emergency and should be followed up as soon as possible or go to the Emergency Department if any problems should occur.  Please show the CHEMOTHERAPY ALERT CARD or IMMUNOTHERAPY ALERT CARD at check-in to the Emergency Department and triage nurse.  Should you have questions after your visit or need to cancel or reschedule your appointment, please contact [facility] [place] - A DEPT OF [facility]  Dept: [phone]  and follow the prompts.  Office hours are 8:00 a.m. to 4:30 p.m. Monday - Friday. Please note that voicemails left after 4:00 p.m. may not be returned until the following business day.  We are closed weekends and major holidays. You have access to a nurse at all times for urgent questions. Please call the main number to the clinic Dept: [phone] and follow the prompts.  For any non-urgent questions, you may also contact your provider using MyChart. We now  offer e-Visits for anyone 90 and older to request care online for non-urgent symptoms. For details visit [email or web address].   Also download the MyChart app! Go to the app store, search "MyChart", open the app, select [place], and log in with your MyChart username and password.

## 2023-10-22 DIAGNOSIS — Z79899 Other long term (current) drug therapy: Secondary | ICD-10-CM | POA: Diagnosis not present

## 2023-10-22 DIAGNOSIS — G4700 Insomnia, unspecified: Secondary | ICD-10-CM | POA: Diagnosis not present

## 2023-10-22 DIAGNOSIS — K5909 Other constipation: Secondary | ICD-10-CM | POA: Diagnosis not present

## 2023-10-22 DIAGNOSIS — Z7901 Long term (current) use of anticoagulants: Secondary | ICD-10-CM | POA: Diagnosis not present

## 2023-10-22 DIAGNOSIS — M25512 Pain in left shoulder: Secondary | ICD-10-CM | POA: Diagnosis not present

## 2023-10-22 DIAGNOSIS — C787 Secondary malignant neoplasm of liver and intrahepatic bile duct: Secondary | ICD-10-CM | POA: Diagnosis not present

## 2023-10-22 DIAGNOSIS — E039 Hypothyroidism, unspecified: Secondary | ICD-10-CM | POA: Diagnosis not present

## 2023-10-22 DIAGNOSIS — Z79891 Long term (current) use of opiate analgesic: Secondary | ICD-10-CM | POA: Diagnosis not present

## 2023-10-22 DIAGNOSIS — M47812 Spondylosis without myelopathy or radiculopathy, cervical region: Secondary | ICD-10-CM | POA: Diagnosis not present

## 2023-10-22 DIAGNOSIS — C50919 Malignant neoplasm of unspecified site of unspecified female breast: Secondary | ICD-10-CM | POA: Diagnosis not present

## 2023-10-22 DIAGNOSIS — E785 Hyperlipidemia, unspecified: Secondary | ICD-10-CM | POA: Diagnosis not present

## 2023-10-22 DIAGNOSIS — M4135 Thoracogenic scoliosis, thoracolumbar region: Secondary | ICD-10-CM | POA: Diagnosis not present

## 2023-10-22 DIAGNOSIS — Z8701 Personal history of pneumonia (recurrent): Secondary | ICD-10-CM | POA: Diagnosis not present

## 2023-10-22 DIAGNOSIS — Z87891 Personal history of nicotine dependence: Secondary | ICD-10-CM | POA: Diagnosis not present

## 2023-10-22 DIAGNOSIS — G8929 Other chronic pain: Secondary | ICD-10-CM | POA: Diagnosis not present

## 2023-10-22 DIAGNOSIS — I4891 Unspecified atrial fibrillation: Secondary | ICD-10-CM | POA: Diagnosis not present

## 2023-10-22 DIAGNOSIS — E0500 Thyrotoxicosis with diffuse goiter without thyrotoxic crisis or storm: Secondary | ICD-10-CM | POA: Diagnosis not present

## 2023-10-22 DIAGNOSIS — M4802 Spinal stenosis, cervical region: Secondary | ICD-10-CM | POA: Diagnosis not present

## 2023-10-22 DIAGNOSIS — M80051D Age-related osteoporosis with current pathological fracture, right femur, subsequent encounter for fracture with routine healing: Secondary | ICD-10-CM | POA: Diagnosis not present

## 2023-10-22 DIAGNOSIS — Z8542 Personal history of malignant neoplasm of other parts of uterus: Secondary | ICD-10-CM | POA: Diagnosis not present

## 2023-10-22 DIAGNOSIS — Z981 Arthrodesis status: Secondary | ICD-10-CM | POA: Diagnosis not present

## 2023-10-22 DIAGNOSIS — C7800 Secondary malignant neoplasm of unspecified lung: Secondary | ICD-10-CM | POA: Diagnosis not present

## 2023-10-22 DIAGNOSIS — I4892 Unspecified atrial flutter: Secondary | ICD-10-CM | POA: Diagnosis not present

## 2023-10-22 DIAGNOSIS — K219 Gastro-esophageal reflux disease without esophagitis: Secondary | ICD-10-CM | POA: Diagnosis not present

## 2023-10-24 DIAGNOSIS — C787 Secondary malignant neoplasm of liver and intrahepatic bile duct: Secondary | ICD-10-CM | POA: Diagnosis not present

## 2023-10-24 DIAGNOSIS — C50919 Malignant neoplasm of unspecified site of unspecified female breast: Secondary | ICD-10-CM | POA: Diagnosis not present

## 2023-10-24 DIAGNOSIS — M80051D Age-related osteoporosis with current pathological fracture, right femur, subsequent encounter for fracture with routine healing: Secondary | ICD-10-CM | POA: Diagnosis not present

## 2023-10-24 DIAGNOSIS — C7800 Secondary malignant neoplasm of unspecified lung: Secondary | ICD-10-CM | POA: Diagnosis not present

## 2023-10-24 DIAGNOSIS — I4892 Unspecified atrial flutter: Secondary | ICD-10-CM | POA: Diagnosis not present

## 2023-10-24 DIAGNOSIS — I4891 Unspecified atrial fibrillation: Secondary | ICD-10-CM | POA: Diagnosis not present

## 2023-10-31 ENCOUNTER — Other Ambulatory Visit (HOSPITAL_BASED_OUTPATIENT_CLINIC_OR_DEPARTMENT_OTHER): Payer: Self-pay

## 2023-11-03 DIAGNOSIS — I4892 Unspecified atrial flutter: Secondary | ICD-10-CM | POA: Diagnosis not present

## 2023-11-03 DIAGNOSIS — I4891 Unspecified atrial fibrillation: Secondary | ICD-10-CM | POA: Diagnosis not present

## 2023-11-03 DIAGNOSIS — C7800 Secondary malignant neoplasm of unspecified lung: Secondary | ICD-10-CM | POA: Diagnosis not present

## 2023-11-03 DIAGNOSIS — C787 Secondary malignant neoplasm of liver and intrahepatic bile duct: Secondary | ICD-10-CM | POA: Diagnosis not present

## 2023-11-03 DIAGNOSIS — M80051D Age-related osteoporosis with current pathological fracture, right femur, subsequent encounter for fracture with routine healing: Secondary | ICD-10-CM | POA: Diagnosis not present

## 2023-11-03 DIAGNOSIS — C50919 Malignant neoplasm of unspecified site of unspecified female breast: Secondary | ICD-10-CM | POA: Diagnosis not present

## 2023-11-06 ENCOUNTER — Other Ambulatory Visit (HOSPITAL_BASED_OUTPATIENT_CLINIC_OR_DEPARTMENT_OTHER): Payer: Self-pay

## 2023-11-06 DIAGNOSIS — M549 Dorsalgia, unspecified: Secondary | ICD-10-CM | POA: Diagnosis not present

## 2023-11-06 DIAGNOSIS — M25561 Pain in right knee: Secondary | ICD-10-CM | POA: Diagnosis not present

## 2023-11-06 DIAGNOSIS — Z6823 Body mass index (BMI) 23.0-23.9, adult: Secondary | ICD-10-CM | POA: Diagnosis not present

## 2023-11-06 DIAGNOSIS — I480 Paroxysmal atrial fibrillation: Secondary | ICD-10-CM | POA: Diagnosis not present

## 2023-11-06 DIAGNOSIS — D6869 Other thrombophilia: Secondary | ICD-10-CM | POA: Diagnosis not present

## 2023-11-06 DIAGNOSIS — F330 Major depressive disorder, recurrent, mild: Secondary | ICD-10-CM | POA: Diagnosis not present

## 2023-11-06 DIAGNOSIS — I10 Essential (primary) hypertension: Secondary | ICD-10-CM | POA: Diagnosis not present

## 2023-11-06 DIAGNOSIS — E039 Hypothyroidism, unspecified: Secondary | ICD-10-CM | POA: Diagnosis not present

## 2023-11-06 MED ORDER — HYDROCODONE-ACETAMINOPHEN 10-325 MG PO TABS
1.0000 | ORAL_TABLET | ORAL | 0 refills | Status: DC | PRN
  Filled 2023-11-06: qty 121, 21d supply, fill #0
  Filled 2023-11-06: qty 29, 4d supply, fill #0
  Filled 2023-11-06: qty 150, 25d supply, fill #0

## 2023-11-07 ENCOUNTER — Other Ambulatory Visit (HOSPITAL_BASED_OUTPATIENT_CLINIC_OR_DEPARTMENT_OTHER): Payer: Self-pay

## 2023-11-07 DIAGNOSIS — M1711 Unilateral primary osteoarthritis, right knee: Secondary | ICD-10-CM | POA: Diagnosis not present

## 2023-11-07 DIAGNOSIS — M79604 Pain in right leg: Secondary | ICD-10-CM | POA: Diagnosis not present

## 2023-11-07 DIAGNOSIS — S32401D Unspecified fracture of right acetabulum, subsequent encounter for fracture with routine healing: Secondary | ICD-10-CM | POA: Diagnosis not present

## 2023-11-07 MED ORDER — DIAZEPAM 10 MG PO TABS
10.0000 mg | ORAL_TABLET | Freq: Every evening | ORAL | 3 refills | Status: DC | PRN
  Filled 2023-11-07: qty 30, 30d supply, fill #0

## 2023-11-08 ENCOUNTER — Other Ambulatory Visit (HOSPITAL_BASED_OUTPATIENT_CLINIC_OR_DEPARTMENT_OTHER): Payer: Self-pay | Admitting: Student

## 2023-11-08 DIAGNOSIS — C50919 Malignant neoplasm of unspecified site of unspecified female breast: Secondary | ICD-10-CM | POA: Diagnosis not present

## 2023-11-08 DIAGNOSIS — S32401D Unspecified fracture of right acetabulum, subsequent encounter for fracture with routine healing: Secondary | ICD-10-CM

## 2023-11-08 DIAGNOSIS — I4891 Unspecified atrial fibrillation: Secondary | ICD-10-CM | POA: Diagnosis not present

## 2023-11-08 DIAGNOSIS — C787 Secondary malignant neoplasm of liver and intrahepatic bile duct: Secondary | ICD-10-CM | POA: Diagnosis not present

## 2023-11-08 DIAGNOSIS — I4892 Unspecified atrial flutter: Secondary | ICD-10-CM | POA: Diagnosis not present

## 2023-11-08 DIAGNOSIS — C7800 Secondary malignant neoplasm of unspecified lung: Secondary | ICD-10-CM | POA: Diagnosis not present

## 2023-11-08 DIAGNOSIS — M80051D Age-related osteoporosis with current pathological fracture, right femur, subsequent encounter for fracture with routine healing: Secondary | ICD-10-CM | POA: Diagnosis not present

## 2023-11-10 ENCOUNTER — Inpatient Hospital Stay: Payer: Medicare Other | Attending: Oncology

## 2023-11-10 ENCOUNTER — Ambulatory Visit (HOSPITAL_BASED_OUTPATIENT_CLINIC_OR_DEPARTMENT_OTHER)
Admission: RE | Admit: 2023-11-10 | Discharge: 2023-11-10 | Disposition: A | Discharge status: Home / Self Care | Source: Ambulatory Visit | Attending: Student | Admitting: Student

## 2023-11-10 VITALS — BP 160/85 | HR 80 | Temp 97.9°F | Resp 18 | Ht 61.0 in | Wt 127.0 lb

## 2023-11-10 DIAGNOSIS — C787 Secondary malignant neoplasm of liver and intrahepatic bile duct: Secondary | ICD-10-CM | POA: Diagnosis not present

## 2023-11-10 DIAGNOSIS — E871 Hypo-osmolality and hyponatremia: Secondary | ICD-10-CM | POA: Diagnosis not present

## 2023-11-10 DIAGNOSIS — S32401D Unspecified fracture of right acetabulum, subsequent encounter for fracture with routine healing: Secondary | ICD-10-CM | POA: Diagnosis not present

## 2023-11-10 DIAGNOSIS — S32402D Unspecified fracture of left acetabulum, subsequent encounter for fracture with routine healing: Secondary | ICD-10-CM

## 2023-11-10 DIAGNOSIS — Z170 Estrogen receptor positive status [ER+]: Secondary | ICD-10-CM | POA: Diagnosis not present

## 2023-11-10 DIAGNOSIS — Z79899 Other long term (current) drug therapy: Secondary | ICD-10-CM | POA: Insufficient documentation

## 2023-11-10 DIAGNOSIS — I10 Essential (primary) hypertension: Secondary | ICD-10-CM | POA: Diagnosis not present

## 2023-11-10 DIAGNOSIS — S32401A Unspecified fracture of right acetabulum, initial encounter for closed fracture: Secondary | ICD-10-CM | POA: Diagnosis not present

## 2023-11-10 DIAGNOSIS — Z79620 Long term (current) use of immunosuppressive biologic: Secondary | ICD-10-CM | POA: Insufficient documentation

## 2023-11-10 DIAGNOSIS — I4891 Unspecified atrial fibrillation: Secondary | ICD-10-CM | POA: Insufficient documentation

## 2023-11-10 DIAGNOSIS — S32511A Fracture of superior rim of right pubis, initial encounter for closed fracture: Secondary | ICD-10-CM | POA: Diagnosis not present

## 2023-11-10 DIAGNOSIS — S72001A Fracture of unspecified part of neck of right femur, initial encounter for closed fracture: Secondary | ICD-10-CM | POA: Diagnosis not present

## 2023-11-10 DIAGNOSIS — Z5112 Encounter for antineoplastic immunotherapy: Secondary | ICD-10-CM | POA: Diagnosis not present

## 2023-11-10 DIAGNOSIS — C50912 Malignant neoplasm of unspecified site of left female breast: Secondary | ICD-10-CM | POA: Diagnosis not present

## 2023-11-10 MED ORDER — TRASTUZUMAB-ANNS CHEMO 150 MG IV SOLR
6.0000 mg/kg | Freq: Once | INTRAVENOUS | Status: AC
  Administered 2023-11-10: 378 mg via INTRAVENOUS
  Filled 2023-11-10: qty 18

## 2023-11-10 MED ORDER — SODIUM CHLORIDE 0.9 % IV SOLN: Freq: Once | INTRAVENOUS | Status: AC

## 2023-11-10 NOTE — Patient Instructions (Signed)
 Trastuzumab Injection What is this medication? TRASTUZUMAB (tras TOO zoo mab) treats breast cancer and stomach cancer. It works by blocking a protein that causes cancer cells to grow and multiply. This helps to slow or stop the spread of cancer cells. This medicine may be used for other purposes; ask your health care provider or pharmacist if you have questions. COMMON BRAND NAME(S): Herceptin, HERCESSI, Herzuma, KANJINTI, Ogivri, Ontruzant, Trazimera What should I tell my care team before I take this medication? They need to know if you have any of these conditions: Heart failure Lung disease An unusual or allergic reaction to trastuzumab, other medications, foods, dyes, or preservatives Pregnant or trying to get pregnant Breast-feeding How should I use this medication? This medication is injected into a vein. It is given by your care team in a hospital or clinic setting. Talk to your care team about the use of this medication in children. It is not approved for use in children. Overdosage: If you think you have taken too much of this medicine contact a poison control center or emergency room at once. NOTE: This medicine is only for you. Do not share this medicine with others. What if I miss a dose? Keep appointments for follow-up doses. It is important not to miss your dose. Call your care team if you are unable to keep an appointment. What may interact with this medication? Certain types of chemotherapy, such as daunorubicin, doxorubicin, epirubicin, idarubicin This list may not describe all possible interactions. Give your health care provider a list of all the medicines, herbs, non-prescription drugs, or dietary supplements you use. Also tell them if you smoke, drink alcohol, or use illegal drugs. Some items may interact with your medicine. What should I watch for while using this medication? Your condition will be monitored carefully while you are receiving this medication. This  medication may make you feel generally unwell. This is not uncommon, as chemotherapy affects healthy cells as well as cancer cells. Report any side effects. Continue your course of treatment even though you feel ill unless your care team tells you to stop. This medication may increase your risk of getting an infection. Call your care team for advice if you get a fever, chills, sore throat, or other symptoms of a cold or flu. Do not treat yourself. Try to avoid being around people who are sick. Avoid taking medications that contain aspirin, acetaminophen, ibuprofen, naproxen, or ketoprofen unless instructed by your care team. These medications can hide a fever. Talk to your care team if you may be pregnant. Serious birth defects can occur if you take this medication during pregnancy and for 7 months after the last dose. You will need a negative pregnancy test before starting this medication. Contraception is recommended while taking this medication and for 7 months after the last dose. Your care team can help you find the option that works for you. Do not breastfeed while taking this medication and for 7 months after stopping treatment. What side effects may I notice from receiving this medication? Side effects that you should report to your care team as soon as possible: Allergic reactions or angioedema--skin rash, itching or hives, swelling of the face, eyes, lips, tongue, arms, or legs, trouble swallowing or breathing Dry cough, shortness of breath or trouble breathing Heart failure--shortness of breath, swelling of the ankles, feet, or hands, sudden weight gain, unusual weakness or fatigue Infection--fever, chills, cough, or sore throat Infusion reactions--chest pain, shortness of breath or trouble breathing, feeling faint  or lightheaded Side effects that usually do not require medical attention (report to your care team if they continue or are  bothersome): Diarrhea Dizziness Headache Nausea Trouble sleeping Vomiting This list may not describe all possible side effects. Call your doctor for medical advice about side effects. You may report side effects to FDA at 1-800-FDA-1088. Where should I keep my medication? This medication is given in a hospital or clinic. It will not be stored at home. NOTE: This sheet is a summary. It may not cover all possible information. If you have questions about this medicine, talk to your doctor, pharmacist, or health care provider.  2024 Elsevier/Gold Standard (2021-12-21 00:00:00)

## 2023-11-17 DIAGNOSIS — C787 Secondary malignant neoplasm of liver and intrahepatic bile duct: Secondary | ICD-10-CM | POA: Diagnosis not present

## 2023-11-17 DIAGNOSIS — I4891 Unspecified atrial fibrillation: Secondary | ICD-10-CM | POA: Diagnosis not present

## 2023-11-17 DIAGNOSIS — C50919 Malignant neoplasm of unspecified site of unspecified female breast: Secondary | ICD-10-CM | POA: Diagnosis not present

## 2023-11-17 DIAGNOSIS — M80051D Age-related osteoporosis with current pathological fracture, right femur, subsequent encounter for fracture with routine healing: Secondary | ICD-10-CM | POA: Diagnosis not present

## 2023-11-17 DIAGNOSIS — I4892 Unspecified atrial flutter: Secondary | ICD-10-CM | POA: Diagnosis not present

## 2023-11-17 DIAGNOSIS — C7800 Secondary malignant neoplasm of unspecified lung: Secondary | ICD-10-CM | POA: Diagnosis not present

## 2023-11-21 ENCOUNTER — Ambulatory Visit: Payer: Self-pay | Admitting: Student

## 2023-11-21 ENCOUNTER — Encounter: Payer: Self-pay | Admitting: Oncology

## 2023-11-21 ENCOUNTER — Other Ambulatory Visit (HOSPITAL_BASED_OUTPATIENT_CLINIC_OR_DEPARTMENT_OTHER): Payer: Self-pay

## 2023-11-21 DIAGNOSIS — Z6823 Body mass index (BMI) 23.0-23.9, adult: Secondary | ICD-10-CM | POA: Diagnosis not present

## 2023-11-21 DIAGNOSIS — C7800 Secondary malignant neoplasm of unspecified lung: Secondary | ICD-10-CM | POA: Diagnosis not present

## 2023-11-21 DIAGNOSIS — E785 Hyperlipidemia, unspecified: Secondary | ICD-10-CM | POA: Diagnosis not present

## 2023-11-21 DIAGNOSIS — Z9181 History of falling: Secondary | ICD-10-CM | POA: Diagnosis not present

## 2023-11-21 DIAGNOSIS — M47812 Spondylosis without myelopathy or radiculopathy, cervical region: Secondary | ICD-10-CM | POA: Diagnosis not present

## 2023-11-21 DIAGNOSIS — E039 Hypothyroidism, unspecified: Secondary | ICD-10-CM | POA: Diagnosis not present

## 2023-11-21 DIAGNOSIS — M4135 Thoracogenic scoliosis, thoracolumbar region: Secondary | ICD-10-CM | POA: Diagnosis not present

## 2023-11-21 DIAGNOSIS — Z87891 Personal history of nicotine dependence: Secondary | ICD-10-CM | POA: Diagnosis not present

## 2023-11-21 DIAGNOSIS — R300 Dysuria: Secondary | ICD-10-CM | POA: Diagnosis not present

## 2023-11-21 DIAGNOSIS — G8929 Other chronic pain: Secondary | ICD-10-CM | POA: Diagnosis not present

## 2023-11-21 DIAGNOSIS — G4700 Insomnia, unspecified: Secondary | ICD-10-CM | POA: Diagnosis not present

## 2023-11-21 DIAGNOSIS — N390 Urinary tract infection, site not specified: Secondary | ICD-10-CM | POA: Diagnosis not present

## 2023-11-21 DIAGNOSIS — Z7901 Long term (current) use of anticoagulants: Secondary | ICD-10-CM | POA: Diagnosis not present

## 2023-11-21 DIAGNOSIS — E0500 Thyrotoxicosis with diffuse goiter without thyrotoxic crisis or storm: Secondary | ICD-10-CM | POA: Diagnosis not present

## 2023-11-21 DIAGNOSIS — I4892 Unspecified atrial flutter: Secondary | ICD-10-CM | POA: Diagnosis not present

## 2023-11-21 DIAGNOSIS — K219 Gastro-esophageal reflux disease without esophagitis: Secondary | ICD-10-CM | POA: Diagnosis not present

## 2023-11-21 DIAGNOSIS — M25561 Pain in right knee: Secondary | ICD-10-CM | POA: Diagnosis not present

## 2023-11-21 DIAGNOSIS — Z981 Arthrodesis status: Secondary | ICD-10-CM | POA: Diagnosis not present

## 2023-11-21 DIAGNOSIS — M80051D Age-related osteoporosis with current pathological fracture, right femur, subsequent encounter for fracture with routine healing: Secondary | ICD-10-CM | POA: Diagnosis not present

## 2023-11-21 DIAGNOSIS — M4802 Spinal stenosis, cervical region: Secondary | ICD-10-CM | POA: Diagnosis not present

## 2023-11-21 DIAGNOSIS — C787 Secondary malignant neoplasm of liver and intrahepatic bile duct: Secondary | ICD-10-CM | POA: Diagnosis not present

## 2023-11-21 DIAGNOSIS — C50919 Malignant neoplasm of unspecified site of unspecified female breast: Secondary | ICD-10-CM | POA: Diagnosis not present

## 2023-11-21 DIAGNOSIS — Z79891 Long term (current) use of opiate analgesic: Secondary | ICD-10-CM | POA: Diagnosis not present

## 2023-11-21 DIAGNOSIS — K5909 Other constipation: Secondary | ICD-10-CM | POA: Diagnosis not present

## 2023-11-21 DIAGNOSIS — Z79899 Other long term (current) drug therapy: Secondary | ICD-10-CM | POA: Diagnosis not present

## 2023-11-21 DIAGNOSIS — Z8542 Personal history of malignant neoplasm of other parts of uterus: Secondary | ICD-10-CM | POA: Diagnosis not present

## 2023-11-21 DIAGNOSIS — Z8701 Personal history of pneumonia (recurrent): Secondary | ICD-10-CM | POA: Diagnosis not present

## 2023-11-21 DIAGNOSIS — I4891 Unspecified atrial fibrillation: Secondary | ICD-10-CM | POA: Diagnosis not present

## 2023-11-21 MED ORDER — SULFAMETHOXAZOLE-TRIMETHOPRIM 800-160 MG PO TABS
1.0000 | ORAL_TABLET | Freq: Two times a day (BID) | ORAL | 0 refills | Status: AC
  Filled 2023-11-21: qty 10, 5d supply, fill #0

## 2023-11-21 MED ORDER — RAMELTEON 8 MG PO TABS
8.0000 mg | ORAL_TABLET | Freq: Every evening | ORAL | 3 refills | Status: DC | PRN
Start: 2023-11-21 — End: 2024-04-17
  Filled 2023-11-21: qty 30, 30d supply, fill #0
  Filled 2024-02-02: qty 30, 30d supply, fill #1

## 2023-11-22 ENCOUNTER — Other Ambulatory Visit (HOSPITAL_BASED_OUTPATIENT_CLINIC_OR_DEPARTMENT_OTHER): Payer: Self-pay

## 2023-11-24 ENCOUNTER — Telehealth: Payer: Self-pay | Admitting: Cardiology

## 2023-11-24 NOTE — Telephone Encounter (Signed)
 Pre-op team,   Patient is currently on Eliquis. Please clarify with requesting office if they would like her to hold Eliquis prior to surgery.   Thank you!  DW

## 2023-11-24 NOTE — Telephone Encounter (Signed)
   Pre-operative Risk Assessment    Patient Name: Karen Allison  DOB: 1945/09/05 MRN: [id number]   Date of last office visit: 09/26/2023 Date of next office visit: 02/06/24   Request for Surgical Clearance    Procedure:   Hip replacement   Date of Surgery:  Clearance 12/21/23                                Surgeon:  Dr. Truitt Merle Surgeon's Group or Practice Name:  [facility] trauma specialist  Phone number:  [phone] Fax number:  [phone]   Type of Clearance Requested:   - Medical    Type of Anesthesia:  General    Additional requests/questions:    SignedAllean Found   11/24/2023, 1:43 PM

## 2023-11-24 NOTE — Telephone Encounter (Signed)
 Per surgeon's the pt will need to hold Eliquis.

## 2023-11-27 DIAGNOSIS — M80051D Age-related osteoporosis with current pathological fracture, right femur, subsequent encounter for fracture with routine healing: Secondary | ICD-10-CM | POA: Diagnosis not present

## 2023-11-27 DIAGNOSIS — C787 Secondary malignant neoplasm of liver and intrahepatic bile duct: Secondary | ICD-10-CM | POA: Diagnosis not present

## 2023-11-27 DIAGNOSIS — I4892 Unspecified atrial flutter: Secondary | ICD-10-CM | POA: Diagnosis not present

## 2023-11-27 DIAGNOSIS — C50919 Malignant neoplasm of unspecified site of unspecified female breast: Secondary | ICD-10-CM | POA: Diagnosis not present

## 2023-11-27 DIAGNOSIS — I4891 Unspecified atrial fibrillation: Secondary | ICD-10-CM | POA: Diagnosis not present

## 2023-11-27 DIAGNOSIS — C7800 Secondary malignant neoplasm of unspecified lung: Secondary | ICD-10-CM | POA: Diagnosis not present

## 2023-11-29 ENCOUNTER — Telehealth (INDEPENDENT_AMBULATORY_CARE_PROVIDER_SITE_OTHER): Admitting: Emergency Medicine

## 2023-11-29 ENCOUNTER — Telehealth: Payer: Self-pay | Admitting: *Deleted

## 2023-11-29 DIAGNOSIS — Z01810 Encounter for preprocedural cardiovascular examination: Secondary | ICD-10-CM | POA: Diagnosis not present

## 2023-11-29 NOTE — Telephone Encounter (Signed)
 Patient scheduled 4/25

## 2023-11-29 NOTE — Progress Notes (Signed)
 Virtual Visit via Telephone Note   Because of Karen Allison co-morbid illnesses, she is at least at moderate risk for complications without adequate follow up.  This format is felt to be most appropriate for this patient at this time.  Due to technical limitations with video connection (technology), today's appointment will be conducted as an audio only telehealth visit, and Karen Allison verbally agreed to proceed in this manner.   All issues noted in this document were discussed and addressed.  No physical exam could be performed with this format.  Evaluation Performed:  Preoperative cardiovascular risk assessment _____________   Date:  11/29/2023   Patient ID:  Karen Allison, DOB Nov 10, 1945, MRN [id number] Patient Location:  Home Provider location:   Office  Primary Care Provider:  Philemon Kingdom, MD Primary Cardiologist:  Norman Herrlich, MD  Chief Complaint / Patient Profile   78 y.o. y/o female with a h/o atrial fibrillation, hypertension, hepatitis, hypothyroidism, history of endometrial cancer, history of breast cancer stage IV with mets to liver and lung, iron deficiency anemia who is pending hip replacement on 12/21/2023 with orthopedic trauma specialist by Dr. Jena Gauss and presents today for telephonic preoperative cardiovascular risk assessment.  History of Present Illness    Karen Allison is a 78 y.o. female who presents via audio/video conferencing for a telehealth visit today.  Pt was last seen in cardiology clinic on 09/26/2023 by Wallis Bamberg, NP.  At that time Fantashia Shupert was doing well.  The patient is now pending procedure as outlined above. Since her last visit, she denies chest pain, shortness of breath, lower extremity edema, fatigue, palpitations, melena, hematuria, hemoptysis, diaphoresis, weakness, presyncope, syncope, orthopnea, falls, and PND.  Today patient is without any acute cardiovascular concerns or complaints today.   She denies any recent falls.  She notes that she has been far less active as of late due to her hip pain and that is why she is now pending hip replacement.  She notes that she does walk around the house with a walker.  She notes that she was far more active before her fractured acetabulum back in November 2024.  However with her more limited activity she is still able to obtain > 4 METS without anginal symptoms.  She also notes she has been taking her blood pressure at home more consistently with blood pressures under for better control. Past Medical History    Past Medical History:  Diagnosis Date   A-fib (HCC)    Acute respiratory failure with hypoxia (HCC) 05/09/2014   Adult hypothyroidism 05/03/2013   Arthritis    Blood dyscrasia    bleeds and bruises easily   Breast cancer (HCC)    left   Cervical spondylosis without myelopathy 05/06/2014   C3-4     Endometrial cancer (HCC) 01/21/1988   Fx lower humerus-closed 10/22/1997   Gait disorder 07/05/2016   Graves' disease 07/16/2021   HCAP (healthcare-associated pneumonia) 05/09/2014   Hepatitis     Hep A years ago   History of corticosteroid therapy 02/13/2023   History of endocrine disorder 02/13/2023   HNP (herniated nucleus pulposus), cervical 05/06/2014   Hypertension    Hyponatremia 12/02/2021   Hypothyroidism    Graves disease, age 30 yo   Malignant neoplasm of left breast (HCC) 06/05/2020   Nose trouble    right nostril will hemorrhage at times   Osteoporosis 11/04/2020   Other forms of scoliosis, thoracolumbar region 11/03/2016   Pneumonia  Rib pain on left side 09/26/2022   Rosacea    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   C3-4     Past Surgical History:  Procedure Laterality Date   ABDOMINAL HYSTERECTOMY     Age 25    ANTERIOR CERVICAL DECOMP/DISCECTOMY FUSION N/A 05/06/2014   Procedure: ANTERIOR CERVICAL DISCECTOMY FUSION C3-4 with transgraft, local bone graft, plate and screws;  Surgeon: Kerrin Champagne, MD;  Location: [facility] OR;  Service: Orthopedics;  Laterality: N/A;   APPENDECTOMY     BREAST LUMPECTOMY Left    COLONOSCOPY     EYE SURGERY Bilateral    cataracts   LUMBAR SPINE SURGERY  2011   Removal of hematoma   OPEN REDUCTION INTERNAL FIXATION ACETABULAR FRACTURE STOPPA Right 07/03/2023   Procedure: OPEN REDUCTION INTERNAL FIXATION ACETABULAR FRACTURE STOPPA;  Surgeon: Roby Lofts, MD;  Location: [facility] OR;  Service: Orthopedics;  Laterality: Right;   ORIF HUMERUS FRACTURE Left 2013   THORACIC SPINE SURGERY  2011   Bone cages   THYROIDECTOMY     TONSILLECTOMY      Allergies  Allergies  Allergen Reactions   Cefuroxime Hives   Zithromax [Azithromycin] Hives    Home Medications    Prior to Admission medications   Medication Sig Start Date End Date Taking? Authorizing Provider  acetaminophen (TYLENOL) 325 MG tablet Take 2 tablets (650 mg total) by mouth 3 (three) times daily as needed for mild pain (pain score 1-3). 07/06/23   Hongalgi, Maximino Greenland, MD  apixaban (ELIQUIS) 5 MG TABS tablet Take 1/2 tablet twice daily 07/25/23   Baldo Daub, MD  B Complex Vitamins (B COMPLEX PO) Take 1 tablet by mouth daily.    [provider]  bisacodyl (DULCOLAX) 10 MG suppository Place 1 suppository (10 mg total) rectally daily as needed for moderate constipation. 07/06/23   Hongalgi, Maximino Greenland, MD  Calcium Carbonate-Vitamin D (CALTRATE 600+D PO) Take 1 tablet by mouth 2 (two) times daily.    [provider]  cholecalciferol (VITAMIN D3) 25 MCG (1000 UNIT) tablet Take 1 tablet (1,000 Units total) by mouth daily. 07/18/23   Angiulli, Mcarthur Rossetti, PA-C  diazepam (VALIUM) 10 MG tablet Take 10 mg by mouth at bedtime as needed. 08/03/23   [provider]  diazepam (VALIUM) 10 MG tablet Take 1  tablet (10 mg total) by mouth at bedtime as needed. 06/27/23     docusate sodium (COLACE) 100 MG capsule Take 2 capsules (200 mg total) by mouth daily after supper. 07/18/23   Angiulli, Mcarthur Rossetti, PA-C  doxycycline (VIBRAMYCIN) 50 MG capsule Take 50 mg by mouth daily.    [provider]  DULoxetine (CYMBALTA) 30 MG capsule Take 30 mg by mouth daily. 08/21/23   [provider]  estradiol (ESTRACE) 0.1 MG/GM vaginal cream Place 1 Applicatorful vaginally 2 (two) times a week. 07/06/23   Hongalgi, Maximino Greenland, MD  ferrous sulfate 324 MG TBEC Take 324 mg by mouth.    [provider]  furosemide (LASIX) 20 MG tablet Take 20 mg by mouth as needed for edema or fluid. 07/26/22   [provider]  gabapentin (NEURONTIN) 300 MG capsule Take 2 capsules (600 mg total) by mouth 2 (two) times daily AND 4 capsules (1,200 mg total) at bedtime. 07/18/23  Angiulli, Mcarthur Rossetti, PA-C  HYDROcodone-acetaminophen (NORCO) 10-325 MG tablet Take 1 tablet by mouth every 6 (six) hours as needed.    [provider]  levothyroxine (SYNTHROID) 50 MCG tablet Take 1 tablet (50 mcg total) by mouth daily. 07/18/23   Angiulli, Mcarthur Rossetti, PA-C  LINZESS 145 MCG CAPS capsule Take 145 mcg by mouth every morning. 08/21/23   [provider]  methocarbamol (ROBAXIN) 500 MG tablet Take 1 tablet (500 mg total) by mouth 4 (four) times daily. 07/18/23   Angiulli, Mcarthur Rossetti, PA-C  metoprolol succinate (TOPROL-XL) 25 MG 24 hr tablet Take 25 mg by mouth daily. 09/18/23   [provider]  metroNIDAZOLE (METROCREAM) 0.75 % cream Apply 1 Application topically as needed (rocesea).    [provider]  Multiple Vitamin (MULTIVITAMIN WITH MINERALS) TABS Take 1 tablet by mouth daily.    [provider]  omeprazole (PRILOSEC) 20 MG capsule Take 1 capsule (20 mg total) by mouth daily. 07/18/23   Angiulli, Mcarthur Rossetti, PA-C  Potassium Chloride ER 20 MEQ TBCR Take 1 tablet by mouth daily. Only  takes when taking her fluid pill 08/03/23   [provider]  ramelteon (ROZEREM) 8 MG tablet Take 1 tablet (8 mg total) by mouth at bedtime as needed. 11/21/23     rosuvastatin (CRESTOR) 10 MG tablet Take 10 mg by mouth once a week. 07/27/22   [provider]  sennosides-docusate sodium (SENOKOT-S) 8.6-50 MG tablet Take 2 tablets by mouth 2 (two) times daily. 07/06/23   Hongalgi, Maximino Greenland, MD  tamsulosin (FLOMAX) 0.4 MG CAPS capsule Take 1 capsule (0.4 mg total) by mouth every evening. 07/18/23   Angiulli, Mcarthur Rossetti, PA-C  trastuzumab (HERCEPTIN) 440 MG injection 440 mg by Intravenous (Continuous Infusion) route every 21 ( twenty-one) days. infusion every 3 weeks 03/19/09   [provider]  traZODone (DESYREL) 50 MG tablet Take 0.5 tablets (25 mg total) by mouth at bedtime as needed for sleep. 07/18/23   Charlton Amor, PA-C    Physical Exam    Vital Signs:  Renessa Wellnitz does not have vital signs available for review today.  Given telephonic nature of communication, physical exam is limited. AAOx3. NAD. Normal affect.  Speech and respirations are unlabored.  Accessory Clinical Findings    None  Assessment & Plan    1.  Preoperative Cardiovascular Risk Assessment: According to the Revised Cardiac Risk Index (RCRI), her Perioperative Risk of Major Cardiac Event is (%): 0.4. Her Functional Capacity in METs is: 4.4 according to the Duke Activity Status Index (DASI). Therefore, based on ACC/AHA guidelines, patient would be at acceptable risk for the planned procedure without further cardiovascular testing.  The patient was advised that if she develops new symptoms prior to surgery to contact our office to arrange for a follow-up visit, and she verbalized understanding.  Per office protocol, patient can hold Eliquis for 3 days prior to procedure.   Patient will not need bridging with Lovenox (enoxaparin) around procedure.  A copy of this note will be routed  to requesting surgeon.  Time:   Today, I have spent 9 minutes with the patient with telehealth technology discussing medical history, symptoms, and management plan.     Denyce Robert, NP  11/29/2023, 10:14 AM

## 2023-11-29 NOTE — Telephone Encounter (Signed)
 Patient with diagnosis of atrial fibrillation on Eliquis for anticoagulation.    Procedure:   Hip replacement    Date of Surgery:  Clearance 12/21/23     CHA2DS2-VASc Score = 4   This indicates a 4.8% annual risk of stroke. The patient's score is based upon: CHF History: 0 HTN History: 1 Diabetes History: 0 Stroke History: 0 Vascular Disease History: 0 Age Score: 2 Gender Score: 1   CrCl 64 Platelet count 318  Per office protocol, patient can hold Eliquis for 3 days prior to procedure.   Patient will not need bridging with Lovenox (enoxaparin) around procedure.  **This guidance is not considered finalized until pre-operative APP has relayed final recommendations.**

## 2023-11-29 NOTE — Telephone Encounter (Signed)
   Name: Karen Allison  DOB: July 19, 1946  MRN: [id number]  Primary Cardiologist: Norman Herrlich, MD   Preoperative team, please contact this patient and set up a phone call appointment for further preoperative risk assessment. Please obtain consent and complete medication review. Thank you for your help.  I confirm that guidance regarding antiplatelet and oral anticoagulation therapy has been completed and, if necessary, noted below.  Per office protocol, patient can hold Eliquis for 3 days prior to procedure.   Patient will not need bridging with Lovenox (enoxaparin) around procedure.  I also confirmed the patient resides in the state of [place]. As per [facility] [employer] telemedicine laws, the patient must reside in the state in which the provider is licensed.   Ronney Asters, NP 11/29/2023, 8:35 AM [facility]

## 2023-11-29 NOTE — Telephone Encounter (Signed)
  Patient Consent for Virtual Visit       Karen Allison has provided verbal consent on 11/29/2023 for a virtual visit (video or telephone).   CONSENT FOR VIRTUAL VISIT FOR:  Karen Allison  By participating in this virtual visit I agree to the following:  I hereby voluntarily request, consent and authorize [facility] and its employed or contracted physicians, physician assistants, nurse practitioners or other licensed health care professionals (the Practitioner), to provide me with telemedicine health care services (the "Services") as deemed necessary by the treating Practitioner. I acknowledge and consent to receive the Services by the Practitioner via telemedicine. I understand that the telemedicine visit will involve communicating with the Practitioner through live audiovisual communication technology and the disclosure of certain medical information by electronic transmission. I acknowledge that I have been given the opportunity to request an in-person assessment or other available alternative prior to the telemedicine visit and am voluntarily participating in the telemedicine visit.  I understand that I have the right to withhold or withdraw my consent to the use of telemedicine in the course of my care at any time, without affecting my right to future care or treatment, and that the Practitioner or I may terminate the telemedicine visit at any time. I understand that I have the right to inspect all information obtained and/or recorded in the course of the telemedicine visit and may receive copies of available information for a reasonable fee.  I understand that some of the potential risks of receiving the Services via telemedicine include:  Delay or interruption in medical evaluation due to technological equipment failure or disruption; Information transmitted may not be sufficient (e.g. poor resolution of images) to allow for appropriate medical decision making by the  Practitioner; and/or  In rare instances, security protocols could fail, causing a breach of personal health information.  Furthermore, I acknowledge that it is my responsibility to provide information about my medical history, conditions and care that is complete and accurate to the best of my ability. I acknowledge that Practitioner's advice, recommendations, and/or decision may be based on factors not within their control, such as incomplete or inaccurate data provided by me or distortions of diagnostic images or specimens that may result from electronic transmissions. I understand that the practice of medicine is not an exact science and that Practitioner makes no warranties or guarantees regarding treatment outcomes. I acknowledge that a copy of this consent can be made available to me via my patient portal [facility] MyChart), or I can request a printed copy by calling the office of [facility].    I understand that my insurance will be billed for this visit.   I have read or had this consent read to me. I understand the contents of this consent, which adequately explains the benefits and risks of the Services being provided via telemedicine.  I have been provided ample opportunity to ask questions regarding this consent and the Services and have had my questions answered to my satisfaction. I give my informed consent for the services to be provided through the use of telemedicine in my medical care

## 2023-11-30 ENCOUNTER — Other Ambulatory Visit: Payer: Self-pay | Admitting: Cardiology

## 2023-11-30 ENCOUNTER — Encounter (HOSPITAL_COMMUNITY): Payer: Self-pay | Admitting: Student

## 2023-11-30 DIAGNOSIS — M25559 Pain in unspecified hip: Secondary | ICD-10-CM

## 2023-11-30 NOTE — Progress Notes (Signed)
 [facility]  [address] [place],  [place]  [address] [phone]  Clinic Day: 12/01/2023  Referring physician: Olan Bering, MD  CHIEF COMPLAINT:  CC: Stage IV breast cancer  Current Treatment:  Trastuzumab  IV every 3 weeks  HISTORY OF PRESENT ILLNESS:  Karen Allison is a 78 y.o. 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  Estrogen and progesterone receptors were positive and HER2 was positive, and scans revealed liver metastases.  She was placed on a clinical trial at [facility] consisting of letrozole and trastuzumab , but she stopped the letrozole after several months because of severe arthralgias.  She also has known osteoarthritis, degenerative disc disease, and osteoporosis, and has had several back surgeries.  She is been on single agent trastuzumab  now for over 12 years with good control of her disease.  This was stopped temporarily in 2011 but a PET scan a few months later revealed a new 9 mm lung nodule felt to represent recurrent disease, which did resolve after she was placed back on the trastuzumab .  She sees Dr. Glennda Langton at [facility] yearly now, usually in July and he does scans and labs at that time.  We do perform periodic echocardiograms and her last one was in July.  She did have her last bone density scan in July of 2015, with no change, and receives Reclast  every 6 months.  She had cervical disc surgery in September of 2016, and then developed a hospital-acquired pneumonia which took a long time to clear.  She had colonoscopy within the last 2 years.  She had a fall in September with 2 fractured ribs.  Her medication list was reviewed.  When I review her Reclast  doses, she received this every 6 months from July of 2014 to January of 2016, and then yearly, with the last dose in January of 2017.  She was having some tooth sensitivity but  this is no longer an issue.  I did review her family history and the patient had endometrial cancer in her 42's and breast cancer in her 48's. She has a first cousin who had breast cancer and a paternal grandmother who had liver cancer, possibly metastatic.  An aunt had liver cancer.  A step uncle had multiple daughters with breast cancer.  The patient was seen in November of 2017 because of a tenderness and possible lump in the right upper outer quadrant.  A mammogram and ultrasound just revealed normal fibro fatty tissue.  She was evaluated at [facility] by Dr. Glennda Langton in July of 2017 and there were some concerns about her scans.  There appeared to be a rim enhancing fluid collection intramuscular in the left vastus lateralis muscle measuring 2 cm which appeared new.  There was a new right posterior 6th rib expansile sclerotic lesion measuring 2.3 cm as well as an old healed right 5th rib fracture which had been stable from the year before.  She did have an MRI of the hip in August which was nonspecific with extensive postoperative findings in the lumbar spine as well as osteoarthritis, osteophytes, and degenerative changes.  There was a simple appearing fluid collection interposed between the iliotibial band and proximal margin of the vastus lateralis consistent with a small ganglion cyst or prior shearing injury.  We repeated the CT of the chest in January of 2018 to follow up on the expansile  lesion of the rib and this remained stable, and consistent with posttraumatic fracture.  She also fractured her right scapula after a fall and she has fractured several ribs on the right.  Her last ECHO in March 2022 remains normal, with an EF of 55 to 60%.  INTERVAL HISTORY:  Karen Allison is here for a follow up for stage IV breast cancer and is on maintenance Herceptin  for many years. Patient states that she is in pain as she complains of severe right hip, leg, and groin pain rating a 7/10. She had fracture of the pelvis and  right hip in November requiring reconstructive surgery but now has worsening problems. She informed me that she will be having hip replacement surgery soon and will have to change her infusion date. This will be changed to April 30th. We recommend a PET scan since she has known history of metastatic disease to liver and lung in the past. I usually do annual CT scans but I think a PET would be more appropriate at this time. I want to include her lower extremities because a CT showed some scattered areas of relative lucency in the tibia and fibula. I will order a whole body PET scan for her today. Her day 1 cycle 59 of Kanjinti  is scheduled on 12/01/2023.  She will have her next dose on April 30 but we will probably have to skip a dose in May while she recovers from her major surgery.  She has a WBC 8.5, hemoglobin of 14.9 improved from 10.4, and platelet count of 221,000. She recently had IV iron  due to severe iron  deficiency and has responded nicely to that. She has a low sodium of 128, elevated AST of 44 up from 19, and alkaline phosphatase of 134 up from 72. The rest of her CMP is normal. She will be due for her Prolia  injection in July. I will see her back in 3 months with CBC and CMP. She denies signs of infection such as sore throat, sinus drainage, cough, or urinary symptoms.  She denies fevers or recurrent chills. She denies nausea, vomiting, chest pain, dyspnea or cough. Her appetite is good.   REVIEW OF SYSTEMS:  Review of Systems  Constitutional: Negative.  Negative for appetite change, chills, diaphoresis, fatigue, fever and unexpected weight change.  HENT:  Negative.  Negative for hearing loss, lump/mass, mouth sores, nosebleeds, sore throat, tinnitus, trouble swallowing and voice change.   Eyes: Negative.  Negative for eye problems and icterus.  Respiratory: Negative.  Negative for chest tightness, cough, hemoptysis, shortness of breath and wheezing.   Cardiovascular: Negative.  Negative for  chest pain, leg swelling and palpitations.  Gastrointestinal: Negative.  Negative for abdominal distention, abdominal pain, blood in stool, constipation, diarrhea, nausea, rectal pain and vomiting.  Endocrine: Negative.   Genitourinary: Negative.  Negative for bladder incontinence, difficulty urinating, dyspareunia, dysuria, frequency, hematuria, menstrual problem, nocturia, pelvic pain, vaginal bleeding and vaginal discharge.   Musculoskeletal:  Positive for back pain and gait problem (using a walker). Negative for arthralgias, flank pain, myalgias, neck pain and neck stiffness.       She has osteopenia with many prior fractures severe right hip, leg, and groin pain rating a 7/10  Skin: Negative.  Negative for itching, rash and wound.  Neurological:  Positive for gait problem (using a walker). Negative for dizziness, extremity weakness, headaches, light-headedness, numbness, seizures and speech difficulty.  Hematological: Negative.  Negative for adenopathy. Does not bruise/bleed easily.  Psychiatric/Behavioral:  Negative for confusion,  decreased concentration, depression, sleep disturbance and suicidal ideas. The patient is not nervous/anxious.     VITALS:  Blood pressure (!) 149/85, pulse 98, temperature 97.8 F (36.6 C), temperature source Oral, resp. rate 16, height 5\' 1"  (1.549 m), SpO2 98%.  Wt Readings from Last 3 Encounters:  12/01/23 126 lb 8 oz (57.4 kg)  11/10/23 127 lb (57.6 kg)  10/20/23 127 lb 4 oz (57.7 kg)    Body mass index is 24 kg/m.  Performance status (ECOG): 2 - Symptomatic, <50% confined to bed  PHYSICAL EXAM:  Physical Exam Vitals and nursing note reviewed. Exam conducted with a chaperone present.  Constitutional:      General: She is not in acute distress.    Appearance: Normal appearance. She is normal weight. She is not ill-appearing, toxic-appearing or diaphoretic.  HENT:     Head: Normocephalic and atraumatic.     Right Ear: Tympanic membrane, ear canal  and external ear normal. There is no impacted cerumen.     Left Ear: Tympanic membrane, ear canal and external ear normal. There is no impacted cerumen.     Nose: Nose normal. No congestion or rhinorrhea.     Mouth/Throat:     Mouth: Mucous membranes are moist.     Pharynx: Oropharynx is clear. No oropharyngeal exudate or posterior oropharyngeal erythema.  Eyes:     General: No scleral icterus.       Right eye: No discharge.        Left eye: No discharge.     Extraocular Movements: Extraocular movements intact.     Conjunctiva/sclera: Conjunctivae normal.     Pupils: Pupils are equal, round, and reactive to light.  Neck:     Vascular: No carotid bruit.  Cardiovascular:     Rate and Rhythm: Normal rate and regular rhythm.     Pulses: Normal pulses.     Heart sounds: Normal heart sounds. No murmur heard.    No friction rub. No gallop.  Pulmonary:     Effort: Pulmonary effort is normal. No respiratory distress.     Breath sounds: Normal breath sounds. No stridor. No wheezing, rhonchi or rales.  Chest:     Chest wall: No mass or tenderness.  Breasts:    Right: Normal. No mass.     Left: Normal. No mass.     Comments: Well healed scar above the left areolar complex on the left breast Well healed scar in the left axilla Both breasts are without masses. Abdominal:     General: Bowel sounds are normal. There is no distension.     Palpations: Abdomen is soft. There is no hepatomegaly, splenomegaly or mass.     Tenderness: There is no abdominal tenderness. There is no right CVA tenderness, left CVA tenderness, guarding or rebound.     Hernia: No hernia is present.  Musculoskeletal:        General: No swelling, tenderness, deformity or signs of injury. Normal range of motion.     Cervical back: Normal range of motion and neck supple. No rigidity or tenderness.     Right lower leg: Edema (trace) present.     Left lower leg: Edema (trace) present.  Lymphadenopathy:     Cervical: No  cervical adenopathy.  Skin:    General: Skin is warm and dry.     Coloration: Skin is not jaundiced or pale.     Findings: No bruising, erythema, lesion or rash.  Neurological:     General: No focal  deficit present.     Mental Status: She is alert and oriented to person, place, and time. Mental status is at baseline.     Cranial Nerves: No cranial nerve deficit.     Sensory: No sensory deficit.     Motor: No weakness.     Coordination: Coordination normal.     Gait: Gait normal.     Deep Tendon Reflexes: Reflexes normal.  Psychiatric:        Mood and Affect: Mood normal.        Behavior: Behavior normal.        Thought Content: Thought content normal.        Judgment: Judgment normal.    LABS:      Latest Ref Rng & Units 12/01/2023   11:05 AM 07/18/2023    2:38 PM 07/07/2023    6:10 AM  CBC  WBC 4.0 - 10.5 K/uL 8.5  7.3  9.6   Hemoglobin 12.0 - 15.0 g/dL 04.5  40.9  9.7   Hematocrit 36.0 - 46.0 % 44.5  33.1  29.5   Platelets 150 - 400 K/uL 221  318  214       Latest Ref Rng & Units 12/01/2023   11:05 AM 07/18/2023    2:38 PM 07/11/2023   12:48 PM  CMP  Glucose 70 - 99 mg/dL 811  914  782   BUN 8 - 23 mg/dL 7  9  10    Creatinine 0.44 - 1.00 mg/dL 9.56  2.13  0.86   Sodium 135 - 145 mmol/L 128  134  132   Potassium 3.5 - 5.1 mmol/L 3.8  4.2  4.6   Chloride 98 - 111 mmol/L 94  100  97   CO2 22 - 32 mmol/L 23  24  24    Calcium  8.9 - 10.3 mg/dL 9.3  8.5  8.6   Total Protein 6.5 - 8.1 g/dL 6.9     Total Bilirubin 0.0 - 1.2 mg/dL 0.4     Alkaline Phos 38 - 126 U/L 134     AST 15 - 41 U/L 44     ALT 0 - 44 U/L 37      Lab Results  Component Value Date   LDH 230 03/10/2006   Lab Results  Component Value Date   TSH 1.825 07/01/2023   STUDIES:      Allergies:  Allergies  Allergen Reactions   Cefuroxime Hives   Zithromax [Azithromycin] Hives    Current Medications: Current Outpatient Medications  Medication Sig Dispense Refill   acetaminophen  (TYLENOL )  325 MG tablet Take 2 tablets (650 mg total) by mouth 3 (three) times daily as needed for mild pain (pain score 1-3).     apixaban  (ELIQUIS ) 5 MG TABS tablet Take 1/2 tablet twice daily 90 tablet 3   B Complex Vitamins (B COMPLEX PO) Take 1 tablet by mouth daily.     Calcium  Carbonate-Vitamin D  (CALTRATE 600+D PO) Take 1 tablet by mouth 2 (two) times daily.     cholecalciferol  (VITAMIN D3) 25 MCG (1000 UNIT) tablet Take 1 tablet (1,000 Units total) by mouth daily. 30 tablet 0   denosumab  (PROLIA ) 60 MG/ML SOSY injection Inject 60 mg into the skin every 6 (six) months.     diazepam  (VALIUM ) 10 MG tablet Take 1 tablet (10 mg total) by mouth at bedtime as needed. (Patient taking differently: Take 5-10 mg by mouth at bedtime as needed for sleep.) 30 tablet 5   diclofenac  Sodium (  VOLTAREN ) 1 % GEL Apply 1 Application topically 4 (four) times daily as needed (pain).     docusate sodium  (COLACE) 100 MG capsule Take 2 capsules (200 mg total) by mouth daily after supper. (Patient taking differently: Take 200 mg by mouth at bedtime.)     doxycycline  (VIBRAMYCIN ) 50 MG capsule Take 50 mg by mouth daily as needed (rosacea).     estradiol  (ESTRACE ) 0.1 MG/GM vaginal cream Place 1 Applicatorful vaginally 2 (two) times a week.     fluconazole (DIFLUCAN) 150 MG tablet Take 150 mg by mouth daily as needed (irritation).     furosemide  (LASIX ) 20 MG tablet Take 20 mg by mouth as needed for edema or fluid.     gabapentin  (NEURONTIN ) 600 MG tablet Take 600-1,200 mg by mouth See admin instructions. 300 mg three times daily, 1200 mg at bedtime     HYDROcodone -acetaminophen  (NORCO) 10-325 MG tablet Take 1 tablet by mouth every 4 (four) to 6 (six) hours as needed. 150 tablet 0   levothyroxine  (SYNTHROID ) 50 MCG tablet Take 1 tablet (50 mcg total) by mouth daily. 30 tablet 0   methocarbamol  (ROBAXIN ) 500 MG tablet Take 1 tablet (500 mg total) by mouth 4 (four) times daily. 120 tablet 0   metoprolol  succinate (TOPROL -XL) 25 MG  24 hr tablet Take 1 tablet (25 mg total) by mouth daily. 90 tablet 2   Multiple Vitamin (MULTIVITAMIN WITH MINERALS) TABS Take 1 tablet by mouth daily.     omeprazole  (PRILOSEC) 20 MG capsule Take 1 capsule (20 mg total) by mouth daily. (Patient taking differently: Take 20 mg by mouth daily as needed (acid refux).) 30 capsule 0   ondansetron  (ZOFRAN ) 8 MG tablet Take 8 mg by mouth every 8 (eight) hours as needed for nausea or vomiting.     Potassium Chloride  ER 20 MEQ TBCR Take 20 mEq by mouth daily. Only takes when taking her fluid pill     rosuvastatin  (CRESTOR ) 10 MG tablet Take 10 mg by mouth once a week. Fridays     sennosides-docusate sodium  (SENOKOT-S) 8.6-50 MG tablet Take 2 tablets by mouth 2 (two) times daily. (Patient taking differently: Take 3-4 tablets by mouth at bedtime.)     tamsulosin  (FLOMAX ) 0.4 MG CAPS capsule Take 1 capsule (0.4 mg total) by mouth every evening. 30 capsule 0   trastuzumab  (HERCEPTIN ) 440 MG injection 440 mg by Intravenous (Continuous Infusion) route every 21 ( twenty-one) days. infusion every 3 weeks     traZODone  (DESYREL ) 50 MG tablet Take 50 mg by mouth at bedtime as needed for sleep.     No current facility-administered medications for this visit.   ASSESSMENT & PLAN:  Assessment:   1.  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 diagnosed in December 2003.  Subsequent x-rays have shown possible bone metastases as well.  She was treated for a brief time with hormonal therapy but was unable to tolerate that.  She currently is on trastuzumab  every 3 weeks and continues to have her disease under control. Her scans were done at [facility] in July of 2023, and were clear. She has now asked me to take over her follow up and annual scans and I have agreed. The latest scans from July of 2024 show no evidence of recurrence or metastatic disease. I recommend a PET scan since she has known history of metastatic  disease to liver and lung in the past. I usually  do annual CT scans but I think a PET would be more appropriate at this time. I want to include her lower extremities because a CT showed some scattered areas of relative lucency in the tibia and fibula. I will order a whole body PET scan for her today.  2.  Osteoporosis, for which she was on yearly Reclast  since July 2014.  She has had many fractures of spine, ribs, scapula, and now the right acetabulum.  However, she has now completed 10 doses and so I recommended that we stop the Reclast  and continue to monitor her bone density.  Her bone density scan in May, 2023 was stable. We will plan to repeat one in the summer, but I feel she does need bone strengthening medicine in view of her many bone fractures. She will be due for her Prolia  injection in July.   3.  Mild chronic hyponatremia of unknown etiology.  Her sodium is stable was 131 last time but went down to 113 in late May and she was hospitalized. Today it is low at 128.   4. Multiple old and new rib fractures. She now has to have a right hip replacement due to further problems with the hip and acetabulum.   5. Iron  deficiency, resolved with IV iron . We will monitor.   Plan: She had fracture of the pelvis and right hip in November requiring reconstructive surgery but now has worsening problems. She informed me that she will be having hip replacement surgery soon and will have to change her infusion date. This will be changed to April 30th. We recommend a PET scan since she has known history of metastatic disease to liver and lung in the past. I usually do annual CT scans but I think a PET would be more appropriate at this time. I want to include her lower extremities because a CT showed some scattered areas of relative lucency in the tibia and fibula. I will order a whole body PET scan for her today. Her day 1 cycle 59 of Kanjinti  is scheduled on 12/01/2023.She will have her next dose on April 30 but  we will probably have to skip a dose in May while she recovers from her major surgery. She has a WBC 8.5, hemoglobin of 14.9 improved from 10.4, and platelet count of 221,000. She recently had IV iron  due to severe iron  deficiency and has responded nicely to that. She has a low sodium of 128, elevated AST of 44 up from 19, and alkaline phosphatase of 134 up from 72. The rest of her CMP is normal. She will be due for her Prolia  injection in July. I will see her back in 3 months with CBC and CMP. She knows to call sooner if problems arise regarding her breast cancer or its treatment.  She understands and agrees with this plan of care.   I provided 27 minutes of face-to-face time during this this encounter and > 50% was spent counseling as documented under my assessment and plan.   Nolia Baumgartner, MD [facility] [place] - A DEPT OF [facility] [address] [place] [address] Dept: [phone] Dept Fax: [phone]   No orders of the defined types were placed in this encounter.  I,Jasmine M Lassiter,acting as a scribe for Nolia Baumgartner, MD.,have documented all relevant documentation on the behalf of Nolia Baumgartner, MD,as directed by  Nolia Baumgartner, MD while in the presence of Randeen Busman  Almer Jacobson, MD.

## 2023-12-01 ENCOUNTER — Other Ambulatory Visit: Payer: Self-pay | Admitting: Student

## 2023-12-01 ENCOUNTER — Inpatient Hospital Stay (HOSPITAL_BASED_OUTPATIENT_CLINIC_OR_DEPARTMENT_OTHER): Payer: Medicare Other | Admitting: Oncology

## 2023-12-01 ENCOUNTER — Other Ambulatory Visit: Payer: Self-pay | Admitting: Oncology

## 2023-12-01 ENCOUNTER — Other Ambulatory Visit (HOSPITAL_COMMUNITY): Payer: Self-pay | Admitting: Student

## 2023-12-01 ENCOUNTER — Encounter: Payer: Self-pay | Admitting: Oncology

## 2023-12-01 ENCOUNTER — Inpatient Hospital Stay: Payer: Medicare Other

## 2023-12-01 ENCOUNTER — Inpatient Hospital Stay: Payer: Medicare Other | Attending: Oncology

## 2023-12-01 VITALS — BP 149/85 | HR 98 | Temp 97.8°F | Resp 16 | Ht 61.0 in

## 2023-12-01 VITALS — Ht 61.0 in | Wt 126.5 lb

## 2023-12-01 DIAGNOSIS — Z170 Estrogen receptor positive status [ER+]: Secondary | ICD-10-CM

## 2023-12-01 DIAGNOSIS — Z79620 Long term (current) use of immunosuppressive biologic: Secondary | ICD-10-CM | POA: Diagnosis not present

## 2023-12-01 DIAGNOSIS — Z5112 Encounter for antineoplastic immunotherapy: Secondary | ICD-10-CM | POA: Insufficient documentation

## 2023-12-01 DIAGNOSIS — M1610 Unilateral primary osteoarthritis, unspecified hip: Secondary | ICD-10-CM

## 2023-12-01 DIAGNOSIS — M8000XG Age-related osteoporosis with current pathological fracture, unspecified site, subsequent encounter for fracture with delayed healing: Secondary | ICD-10-CM | POA: Diagnosis not present

## 2023-12-01 DIAGNOSIS — C787 Secondary malignant neoplasm of liver and intrahepatic bile duct: Secondary | ICD-10-CM

## 2023-12-01 DIAGNOSIS — D509 Iron deficiency anemia, unspecified: Secondary | ICD-10-CM

## 2023-12-01 DIAGNOSIS — C50912 Malignant neoplasm of unspecified site of left female breast: Secondary | ICD-10-CM | POA: Insufficient documentation

## 2023-12-01 LAB — CBC WITH DIFFERENTIAL (CANCER CENTER ONLY)
Abs Immature Granulocytes: 0.03 10*3/uL (ref 0.00–0.07)
Basophils Absolute: 0 10*3/uL (ref 0.0–0.1)
Basophils Relative: 0 %
Eosinophils Absolute: 0 10*3/uL (ref 0.0–0.5)
Eosinophils Relative: 0 %
HCT: 44.5 % (ref 36.0–46.0)
Hemoglobin: 14.9 g/dL (ref 12.0–15.0)
Immature Granulocytes: 0 %
Lymphocytes Relative: 16 %
Lymphs Abs: 1.4 10*3/uL (ref 0.7–4.0)
MCH: 31 pg (ref 26.0–34.0)
MCHC: 33.5 g/dL (ref 30.0–36.0)
MCV: 92.7 fL (ref 80.0–100.0)
Monocytes Absolute: 0.8 10*3/uL (ref 0.1–1.0)
Monocytes Relative: 9 %
Neutro Abs: 6.3 10*3/uL (ref 1.7–7.7)
Neutrophils Relative %: 75 %
Platelet Count: 221 10*3/uL (ref 150–400)
RBC: 4.8 MIL/uL (ref 3.87–5.11)
RDW: 13.1 % (ref 11.5–15.5)
WBC Count: 8.5 10*3/uL (ref 4.0–10.5)
nRBC: 0 % (ref 0.0–0.2)
nRBC: 0 /100{WBCs}

## 2023-12-01 LAB — CMP (CANCER CENTER ONLY)
ALT: 37 U/L (ref 0–44)
AST: 44 U/L — ABNORMAL HIGH (ref 15–41)
Albumin: 4.2 g/dL (ref 3.5–5.0)
Alkaline Phosphatase: 134 U/L — ABNORMAL HIGH (ref 38–126)
Anion gap: 11 (ref 5–15)
BUN: 7 mg/dL — ABNORMAL LOW (ref 8–23)
CO2: 23 mmol/L (ref 22–32)
Calcium: 9.3 mg/dL (ref 8.9–10.3)
Chloride: 94 mmol/L — ABNORMAL LOW (ref 98–111)
Creatinine: 0.59 mg/dL (ref 0.44–1.00)
GFR, Estimated: >60 mL/min (ref >60)
Glucose, Bld: 137 mg/dL — ABNORMAL HIGH (ref 70–99)
Potassium: 3.8 mmol/L (ref 3.5–5.1)
Sodium: 128 mmol/L — ABNORMAL LOW (ref 135–145)
Total Bilirubin: 0.4 mg/dL (ref 0.0–1.2)
Total Protein: 6.9 g/dL (ref 6.5–8.1)

## 2023-12-01 LAB — IRON AND TIBC
Iron: 170 ug/dL (ref 28–170)
Saturation Ratios: 68 % — ABNORMAL HIGH (ref 10.4–31.8)
TIBC: 251 ug/dL (ref 250–450)
UIBC: 81 ug/dL

## 2023-12-01 LAB — FERRITIN: Ferritin: 309 ng/mL — ABNORMAL HIGH (ref 11–307)

## 2023-12-01 MED ORDER — SODIUM CHLORIDE 0.9 % IV SOLN: Freq: Once | INTRAVENOUS | Status: AC

## 2023-12-01 MED ORDER — SODIUM CHLORIDE 0.9 % IV SOLN
6.0000 mg/kg | Freq: Once | INTRAVENOUS | Status: AC
  Administered 2023-12-01: 378 mg via INTRAVENOUS
  Filled 2023-12-01: qty 18

## 2023-12-01 NOTE — Patient Instructions (Signed)
 [facility] [place] - A DEPT [facility]  Discharge Instructions: Thank you for choosing [facility] to provide your oncology and hematology care.  If you have a lab appointment with the Cancer Center, please go directly to the Cancer Center and check in at the registration area.   Wear comfortable clothing and clothing appropriate for easy access to any Portacath or PICC line.   We strive to give you quality time with your provider. You may need to reschedule your appointment if you arrive late (15 or more minutes).  Arriving late affects you and other patients whose appointments are after yours.  Also, if you miss three or more appointments without notifying the office, you may be dismissed from the clinic at the provider's discretion.      For prescription refill requests, have your pharmacy contact our office and allow 72 hours for refills to be completed.    Today you received the following chemotherapy and/or immunotherapy agents Kanjinti      To help prevent nausea and vomiting after your treatment, we encourage you to take your nausea medication as directed.  BELOW ARE SYMPTOMS THAT SHOULD BE REPORTED IMMEDIATELY: *FEVER GREATER THAN 100.4 F (38 C) OR HIGHER *CHILLS OR SWEATING *NAUSEA AND VOMITING THAT IS NOT CONTROLLED WITH YOUR NAUSEA MEDICATION *UNUSUAL SHORTNESS OF BREATH *UNUSUAL BRUISING OR BLEEDING *URINARY PROBLEMS (pain or burning when urinating, or frequent urination) *BOWEL PROBLEMS (unusual diarrhea, constipation, pain near the anus) TENDERNESS IN MOUTH AND THROAT WITH OR WITHOUT PRESENCE OF ULCERS (sore throat, sores in mouth, or a toothache) UNUSUAL RASH, SWELLING OR PAIN  UNUSUAL VAGINAL DISCHARGE OR ITCHING   Items with * indicate a potential emergency and should be followed up as soon as possible or go to the Emergency Department if any problems should occur.  Please show the CHEMOTHERAPY ALERT CARD or IMMUNOTHERAPY ALERT  CARD at check-in to the Emergency Department and triage nurse.  Should you have questions after your visit or need to cancel or reschedule your appointment, please contact [facility] [place] - A DEPT OF [facility]  Dept: [phone]  and follow the prompts.  Office hours are 8:00 a.m. to 4:30 p.m. Monday - Friday. Please note that voicemails left after 4:00 p.m. may not be returned until the following business day.  We are closed weekends and major holidays. You have access to a nurse at all times for urgent questions. Please call the main number to the clinic Dept: [phone] and follow the prompts.  For any non-urgent questions, you may also contact your provider using MyChart. We now offer e-Visits for anyone 80 and older to request care online for non-urgent symptoms. For details visit [email or web address].   Also download the MyChart app! Go to the app store, search "MyChart", open the app, select [place], and log in with your MyChart username and password.

## 2023-12-01 NOTE — Progress Notes (Signed)
 Sterling Big, MD  Claudean Kinds; Estell Harpin; Zollie Scale This is not an IR procedure but rather a fluoro procedure and not something we do in the hospital.  Recommend referring to [facility].  Added Larene Beach and Victorino Dike to assist  [facility]       Previous Messages    ----- Message ----- From: Claudean Kinds Sent: 12/01/2023  10:40 AM EDT To: Claudean Kinds; Ir Procedure Requests Subject: DG FLUORO GUIDED NEEDLE PLC ASPIRATION/INJEC*  Procedure : DG FLUORO GUIDED NEEDLE PLC ASPIRATION/INJECTION LOC  Reason :hip pain Dx: Hip arthritis [M16.10 (ICD-10-CM)]  Ordering Comments  Fluro guided R hip inj     History : CT right hip w/o , CT pelvis w/o, xray hip , xray pelvis , CT tibia fibula right w/o  Provider: Roby Lofts, MD  Provider contact: [phone]    ----- was not sure if review needed

## 2023-12-02 ENCOUNTER — Other Ambulatory Visit: Payer: Self-pay

## 2023-12-04 ENCOUNTER — Other Ambulatory Visit: Payer: Self-pay | Admitting: Student

## 2023-12-04 ENCOUNTER — Ambulatory Visit (HOSPITAL_BASED_OUTPATIENT_CLINIC_OR_DEPARTMENT_OTHER)
Admission: RE | Admit: 2023-12-04 | Discharge: 2023-12-04 | Disposition: A | Discharge status: Home / Self Care | Source: Ambulatory Visit | Attending: Oncology | Admitting: Oncology

## 2023-12-04 DIAGNOSIS — C50912 Malignant neoplasm of unspecified site of left female breast: Secondary | ICD-10-CM

## 2023-12-04 DIAGNOSIS — I700 Atherosclerosis of aorta: Secondary | ICD-10-CM | POA: Diagnosis not present

## 2023-12-04 DIAGNOSIS — Z170 Estrogen receptor positive status [ER+]: Secondary | ICD-10-CM | POA: Diagnosis not present

## 2023-12-04 DIAGNOSIS — C787 Secondary malignant neoplasm of liver and intrahepatic bile duct: Secondary | ICD-10-CM

## 2023-12-04 DIAGNOSIS — M1610 Unilateral primary osteoarthritis, unspecified hip: Secondary | ICD-10-CM

## 2023-12-04 DIAGNOSIS — I6523 Occlusion and stenosis of bilateral carotid arteries: Secondary | ICD-10-CM | POA: Diagnosis not present

## 2023-12-04 DIAGNOSIS — Z981 Arthrodesis status: Secondary | ICD-10-CM | POA: Diagnosis not present

## 2023-12-04 DIAGNOSIS — S32401A Unspecified fracture of right acetabulum, initial encounter for closed fracture: Secondary | ICD-10-CM | POA: Diagnosis not present

## 2023-12-04 MED ORDER — FLUDEOXYGLUCOSE F - 18 (FDG) INJECTION
11.5000 | Freq: Once | INTRAVENOUS | Status: AC | PRN
  Administered 2023-12-04: 6.12 via INTRAVENOUS

## 2023-12-06 DIAGNOSIS — M80051D Age-related osteoporosis with current pathological fracture, right femur, subsequent encounter for fracture with routine healing: Secondary | ICD-10-CM | POA: Diagnosis not present

## 2023-12-06 DIAGNOSIS — C787 Secondary malignant neoplasm of liver and intrahepatic bile duct: Secondary | ICD-10-CM | POA: Diagnosis not present

## 2023-12-06 DIAGNOSIS — I4891 Unspecified atrial fibrillation: Secondary | ICD-10-CM | POA: Diagnosis not present

## 2023-12-06 DIAGNOSIS — I4892 Unspecified atrial flutter: Secondary | ICD-10-CM | POA: Diagnosis not present

## 2023-12-06 DIAGNOSIS — C50919 Malignant neoplasm of unspecified site of unspecified female breast: Secondary | ICD-10-CM | POA: Diagnosis not present

## 2023-12-06 DIAGNOSIS — C7800 Secondary malignant neoplasm of unspecified lung: Secondary | ICD-10-CM | POA: Diagnosis not present

## 2023-12-07 ENCOUNTER — Telehealth: Payer: Self-pay

## 2023-12-07 ENCOUNTER — Other Ambulatory Visit (HOSPITAL_BASED_OUTPATIENT_CLINIC_OR_DEPARTMENT_OTHER): Payer: Self-pay

## 2023-12-07 MED ORDER — HYDROCODONE-ACETAMINOPHEN 10-325 MG PO TABS
1.0000 | ORAL_TABLET | ORAL | 0 refills | Status: DC | PRN
  Filled 2023-12-07: qty 150, 25d supply, fill #0

## 2023-12-07 NOTE — Telephone Encounter (Signed)
 Called patient and informed her of the PET scan report. Patient states she will make sure to let Dr. Almer Jacobson look at her arm at her next infusion appointment on 12/20/23.

## 2023-12-07 NOTE — Telephone Encounter (Signed)
-----   Message from Nolia Baumgartner sent at 12/06/2023  3:48 PM EDT ----- Regarding: call Tell her PET scan looks good, no lesions in the bones except activity shows up where the fracture was, due to the injury.  The only thing I see is a tiny nodule of the right deltoid area which shows a little activity.  Has she had an injection or injury there?  Send copy of PET report to her PCP and her orthopedic surgeon  Jearlean Mince, I would like to check this area when she is here 4/30 for her treatment

## 2023-12-08 ENCOUNTER — Other Ambulatory Visit (HOSPITAL_BASED_OUTPATIENT_CLINIC_OR_DEPARTMENT_OTHER): Payer: Self-pay

## 2023-12-08 MED ORDER — DIAZEPAM 10 MG PO TABS
10.0000 mg | ORAL_TABLET | Freq: Every evening | ORAL | 5 refills | Status: DC | PRN
  Filled 2023-12-08: qty 30, 30d supply, fill #0
  Filled 2024-01-09: qty 30, 30d supply, fill #1
  Filled 2024-02-09: qty 30, 30d supply, fill #2
  Filled 2024-03-13: qty 30, 30d supply, fill #3
  Filled 2024-04-23: qty 30, 30d supply, fill #4
  Filled 2024-05-27: qty 30, 30d supply, fill #5

## 2023-12-13 ENCOUNTER — Encounter: Payer: Self-pay | Admitting: Oncology

## 2023-12-14 ENCOUNTER — Telehealth: Payer: Self-pay | Admitting: Cardiology

## 2023-12-14 NOTE — Telephone Encounter (Signed)
 Returned pt's call and made her aware that we were aware that her surgery is 12/21/23 and that clearance had already been sent over.  She was thankful for the call back.

## 2023-12-14 NOTE — Telephone Encounter (Signed)
 Patient called to report her surgery is now scheduled for 5/1.

## 2023-12-14 NOTE — Pre-Procedure Instructions (Signed)
 Surgical Instructions   Your procedure is scheduled on Dec 21, 2023. Report to [facility] Main Entrance "A" at 5:30 A.M., then check in with the Admitting office. Any questions or running late day of surgery: call [phone]  Questions prior to your surgery date: call [phone], Monday-Friday, 8am-4pm. If you experience any cold or flu symptoms such as cough, fever, chills, shortness of breath, etc. between now and your scheduled surgery, please notify us  at the above number.     Remember:  Do not eat after midnight the night before your surgery  You may drink clear liquids until 4:30 AM the morning of your surgery.   Clear liquids allowed are: Water, Non-Citrus Juices (without pulp), Carbonated Beverages, Clear Tea (no milk, honey, etc.), Black Coffee Only (NO MILK, CREAM OR POWDERED CREAMER of any kind), and Gatorade.  Patient Instructions  The night before surgery:  No food after midnight. ONLY clear liquids after midnight  The day of surgery (if you do NOT have diabetes):  Drink ONE (1) Pre-Surgery Clear Ensure by 4:30 AM the morning of surgery. Drink in one sitting. Do not sip.  This drink was given to you during your hospital  pre-op appointment visit.  Nothing else to drink after completing the  Pre-Surgery Clear Ensure.         If you have questions, please contact your surgeon's office.    Take these medicines the morning of surgery with A SIP OF WATER: gabapentin  (NEURONTIN )  levothyroxine  (SYNTHROID )  methocarbamol  (ROBAXIN )  metoprolol  succinate (TOPROL -XL)    May take these medicines IF NEEDED: acetaminophen  (TYLENOL )  diazepam  (VALIUM )  doxycycline  (VIBRAMYCIN )  fluconazole (DIFLUCAN)  HYDROcodone -acetaminophen  (NORCO)  omeprazole  (PRILOSEC)  ondansetron  (ZOFRAN )    Follow your surgeon's instructions on when to stop apixaban  (ELIQUIS ).  If no instructions were given by your surgeon then you will need to call the office to get those instructions.      One week prior to surgery, STOP taking any Aspirin  (unless otherwise instructed by your surgeon) Aleve, Naproxen, Ibuprofen , Motrin , Advil , Goody's, BC's, all herbal medications, fish oil, and non-prescription vitamins. This includes your medication: diclofenac  Sodium (VOLTAREN ) GEL                      Do NOT Smoke (Tobacco/Vaping) for 24 hours prior to your procedure.  If you use a CPAP at night, you may bring your mask/headgear for your overnight stay.   You will be asked to remove any contacts, glasses, piercing's, hearing aid's, dentures/partials prior to surgery. Please bring cases for these items if needed.    Patients discharged the day of surgery will not be allowed to drive home, and someone needs to stay with them for 24 hours.  SURGICAL WAITING ROOM VISITATION Patients may have no more than 2 support people in the waiting area - these visitors may rotate.   Pre-op nurse will coordinate an appropriate time for 1 ADULT support person, who may not rotate, to accompany patient in pre-op.  Children under the age of 48 must have an adult with them who is not the patient and must remain in the main waiting area with an adult.  If the patient needs to stay at the hospital during part of their recovery, the visitor guidelines for inpatient rooms apply.  Please refer to the [facility] website for the visitor guidelines for any additional information.   If you received a COVID test during your pre-op visit  it is requested that  you wear a mask when out in public, stay away from anyone that may not be feeling well and notify your surgeon if you develop symptoms. If you have been in contact with anyone that has tested positive in the last 10 days please notify you surgeon.      Pre-operative 5 CHG Bathing Instructions   You can play a key role in reducing the risk of infection after surgery. Your skin needs to be as free of germs as possible. You can reduce the number of germs on  your skin by washing with CHG (chlorhexidine  gluconate) soap before surgery. CHG is an antiseptic soap that kills germs and continues to kill germs even after washing.   DO NOT use if you have an allergy to chlorhexidine /CHG or antibacterial soaps. If your skin becomes reddened or irritated, stop using the CHG and notify one of our RNs at [phone].   Please shower with the CHG soap starting 4 days before surgery using the following schedule:     Please keep in mind the following:  DO NOT shave, including legs and underarms, starting the day of your first shower.   You may shave your face at any point before/day of surgery.  Place clean sheets on your bed the day you start using CHG soap. Use a clean washcloth (not used since being washed) for each shower. DO NOT sleep with pets once you start using the CHG.   CHG Shower Instructions:  Wash your face and private area with normal soap. If you choose to wash your hair, wash first with your normal shampoo.  After you use shampoo/soap, rinse your hair and body thoroughly to remove shampoo/soap residue.  Turn the water OFF and apply about 3 tablespoons (45 ml) of CHG soap to a CLEAN washcloth.  Apply CHG soap ONLY FROM YOUR NECK DOWN TO YOUR TOES (washing for 3-5 minutes)  DO NOT use CHG soap on face, private areas, open wounds, or sores.  Pay special attention to the area where your surgery is being performed.  If you are having back surgery, having someone wash your back for you may be helpful. Wait 2 minutes after CHG soap is applied, then you may rinse off the CHG soap.  Pat dry with a clean towel  Put on clean clothes/pajamas   If you choose to wear lotion, please use ONLY the CHG-compatible lotions that are listed below.  Additional instructions for the day of surgery: DO NOT APPLY any lotions, deodorants, cologne, or perfumes.   Do not bring valuables to the hospital. [facility] is not responsible for any belongings/valuables. Do  not wear nail polish, gel polish, artificial nails, or any other type of covering on natural nails (fingers and toes) Do not wear jewelry or makeup Put on clean/comfortable clothes.  Please brush your teeth.  Ask your nurse before applying any prescription medications to the skin.     CHG Compatible Lotions   Aveeno Moisturizing lotion  Cetaphil Moisturizing Cream  Cetaphil Moisturizing Lotion  Clairol Herbal Essence Moisturizing Lotion, Dry Skin  Clairol Herbal Essence Moisturizing Lotion, Extra Dry Skin  Clairol Herbal Essence Moisturizing Lotion, Normal Skin  Curel Age Defying Therapeutic Moisturizing Lotion with Alpha Hydroxy  Curel Extreme Care Body Lotion  Curel Soothing Hands Moisturizing Hand Lotion  Curel Therapeutic Moisturizing Cream, Fragrance-Free  Curel Therapeutic Moisturizing Lotion, Fragrance-Free  Curel Therapeutic Moisturizing Lotion, Original Formula  Eucerin Daily Replenishing Lotion  Eucerin Dry Skin Therapy Plus Alpha Hydroxy Crme  Eucerin  Dry Skin Therapy Plus Alpha Hydroxy Lotion  Eucerin Original Crme  Eucerin Original Lotion  Eucerin Plus Crme Eucerin Plus Lotion  Eucerin TriLipid Replenishing Lotion  Keri Anti-Bacterial Hand Lotion  Keri Deep Conditioning Original Lotion Dry Skin Formula Softly Scented  Keri Deep Conditioning Original Lotion, Fragrance Free Sensitive Skin Formula  Keri Lotion Fast Absorbing Fragrance Free Sensitive Skin Formula  Keri Lotion Fast Absorbing Softly Scented Dry Skin Formula  Keri Original Lotion  Keri Skin Renewal Lotion Keri Silky Smooth Lotion  Keri Silky Smooth Sensitive Skin Lotion  Nivea Body Creamy Conditioning Oil  Nivea Body Extra Enriched Lotion  Nivea Body Original Lotion  Nivea Body Sheer Moisturizing Lotion Nivea Crme  Nivea Skin Firming Lotion  NutraDerm 30 Skin Lotion  NutraDerm Skin Lotion  NutraDerm Therapeutic Skin Cream  NutraDerm Therapeutic Skin Lotion  ProShield Protective Hand Cream   Provon moisturizing lotion  Please read over the following fact sheets that you were given.

## 2023-12-15 ENCOUNTER — Other Ambulatory Visit: Payer: Self-pay

## 2023-12-15 ENCOUNTER — Encounter (HOSPITAL_COMMUNITY): Payer: Self-pay

## 2023-12-15 ENCOUNTER — Encounter (HOSPITAL_COMMUNITY)
Admission: RE | Admit: 2023-12-15 | Discharge: 2023-12-15 | Disposition: A | Discharge status: Home / Self Care | Source: Ambulatory Visit | Attending: Student | Admitting: Student

## 2023-12-15 VITALS — BP 105/60 | HR 97 | Temp 98.0°F | Resp 18 | Ht 61.0 in | Wt 130.6 lb

## 2023-12-15 DIAGNOSIS — I10 Essential (primary) hypertension: Secondary | ICD-10-CM | POA: Diagnosis not present

## 2023-12-15 DIAGNOSIS — Z981 Arthrodesis status: Secondary | ICD-10-CM | POA: Insufficient documentation

## 2023-12-15 DIAGNOSIS — E039 Hypothyroidism, unspecified: Secondary | ICD-10-CM | POA: Diagnosis not present

## 2023-12-15 DIAGNOSIS — G4733 Obstructive sleep apnea (adult) (pediatric): Secondary | ICD-10-CM | POA: Insufficient documentation

## 2023-12-15 DIAGNOSIS — I4891 Unspecified atrial fibrillation: Secondary | ICD-10-CM | POA: Insufficient documentation

## 2023-12-15 DIAGNOSIS — M259 Joint disorder, unspecified: Secondary | ICD-10-CM

## 2023-12-15 DIAGNOSIS — E871 Hypo-osmolality and hyponatremia: Secondary | ICD-10-CM | POA: Diagnosis not present

## 2023-12-15 DIAGNOSIS — M2419 Other articular cartilage disorders, other specified site: Secondary | ICD-10-CM

## 2023-12-15 DIAGNOSIS — C787 Secondary malignant neoplasm of liver and intrahepatic bile duct: Secondary | ICD-10-CM | POA: Diagnosis not present

## 2023-12-15 DIAGNOSIS — Z7901 Long term (current) use of anticoagulants: Secondary | ICD-10-CM | POA: Insufficient documentation

## 2023-12-15 DIAGNOSIS — I340 Nonrheumatic mitral (valve) insufficiency: Secondary | ICD-10-CM | POA: Insufficient documentation

## 2023-12-15 DIAGNOSIS — C7800 Secondary malignant neoplasm of unspecified lung: Secondary | ICD-10-CM | POA: Diagnosis not present

## 2023-12-15 DIAGNOSIS — Z01812 Encounter for preprocedural laboratory examination: Secondary | ICD-10-CM | POA: Diagnosis not present

## 2023-12-15 DIAGNOSIS — I2722 Pulmonary hypertension due to left heart disease: Secondary | ICD-10-CM | POA: Insufficient documentation

## 2023-12-15 DIAGNOSIS — S32401D Unspecified fracture of right acetabulum, subsequent encounter for fracture with routine healing: Secondary | ICD-10-CM

## 2023-12-15 DIAGNOSIS — Z853 Personal history of malignant neoplasm of breast: Secondary | ICD-10-CM | POA: Insufficient documentation

## 2023-12-15 DIAGNOSIS — Z01818 Encounter for other preprocedural examination: Secondary | ICD-10-CM

## 2023-12-15 HISTORY — DX: Unspecified asthma, uncomplicated: J45.909

## 2023-12-15 HISTORY — DX: Depression, unspecified: F32.A

## 2023-12-15 HISTORY — DX: Cardiac arrhythmia, unspecified: I49.9

## 2023-12-15 HISTORY — DX: Anemia, unspecified: D64.9

## 2023-12-15 HISTORY — DX: Anxiety disorder, unspecified: F41.9

## 2023-12-15 LAB — COMPREHENSIVE METABOLIC PANEL WITH GFR
ALT: 18 U/L (ref 0–44)
AST: 22 U/L (ref 15–41)
Albumin: 3.6 g/dL (ref 3.5–5.0)
Alkaline Phosphatase: 84 U/L (ref 38–126)
Anion gap: 9 (ref 5–15)
BUN: 6 mg/dL — ABNORMAL LOW (ref 8–23)
CO2: 26 mmol/L (ref 22–32)
Calcium: 9 mg/dL (ref 8.9–10.3)
Chloride: 94 mmol/L — ABNORMAL LOW (ref 98–111)
Creatinine, Ser: 0.5 mg/dL (ref 0.44–1.00)
GFR, Estimated: >60 mL/min (ref >60)
Glucose, Bld: 119 mg/dL — ABNORMAL HIGH (ref 70–99)
Potassium: 4.2 mmol/L (ref 3.5–5.1)
Sodium: 129 mmol/L — ABNORMAL LOW (ref 135–145)
Total Bilirubin: 0.6 mg/dL (ref 0.0–1.2)
Total Protein: 6.7 g/dL (ref 6.5–8.1)

## 2023-12-15 LAB — CBC WITH DIFFERENTIAL/PLATELET
Abs Immature Granulocytes: 0.03 10*3/uL (ref 0.00–0.07)
Basophils Absolute: 0 10*3/uL (ref 0.0–0.1)
Basophils Relative: 0 %
Eosinophils Absolute: 0.1 10*3/uL (ref 0.0–0.5)
Eosinophils Relative: 1 %
HCT: 41.8 % (ref 36.0–46.0)
Hemoglobin: 13.9 g/dL (ref 12.0–15.0)
Immature Granulocytes: 1 %
Lymphocytes Relative: 22 %
Lymphs Abs: 1.4 10*3/uL (ref 0.7–4.0)
MCH: 32.3 pg (ref 26.0–34.0)
MCHC: 33.3 g/dL (ref 30.0–36.0)
MCV: 97.2 fL (ref 80.0–100.0)
Monocytes Absolute: 0.6 10*3/uL (ref 0.1–1.0)
Monocytes Relative: 9 %
Neutro Abs: 4.3 10*3/uL (ref 1.7–7.7)
Neutrophils Relative %: 67 %
Platelets: 185 10*3/uL (ref 150–400)
RBC: 4.3 MIL/uL (ref 3.87–5.11)
RDW: 13.1 % (ref 11.5–15.5)
WBC: 6.4 10*3/uL (ref 4.0–10.5)
nRBC: 0 % (ref 0.0–0.2)

## 2023-12-15 LAB — SURGICAL PCR SCREEN
MRSA, PCR: NEGATIVE
Staphylococcus aureus: NEGATIVE

## 2023-12-15 LAB — TYPE AND SCREEN
ABO/RH(D): O NEG
Antibody Screen: NEGATIVE

## 2023-12-15 LAB — PROTIME-INR
INR: 1.2 (ref 0.8–1.2)
Prothrombin Time: 15.3 s — ABNORMAL HIGH (ref 11.4–15.2)

## 2023-12-15 NOTE — Progress Notes (Addendum)
 PCP - Dr. Olan Bering Cardiologist - Dr. Zoe Hinds - Last office visit 09/26/2023 Oncology - Dr. Aurther Blue  PPM/ICD - Denies Device Orders - n/a Rep Notified - n/a  Chest x-ray - 01/06/2023 (CE) EKG - 07/03/2023 Stress Test - Denies ECHO - 09/26/2023 - Pt gets echo's every 6 months for monitoring during chemotherapy Cardiac Cath - Denies  Sleep Study - +OSA, but pt unable to tolerate CPAP  No DM  Last dose of GLP1 agonist- n/a GLP1 instructions: n/a  Blood Thinner Instructions: Pt instructed to hold Eliquis  three days prior to surgery. Last dose will be April 27th Aspirin  Instructions: n/a  ERAS Protcol - Clear liquids until 0430 morning of surgery PRE-SURGERY Ensure or G2- Ensure given to pt with instructions  COVID TEST- n/a   Anesthesia review: Yes. Cardiac clearance. Hx of HTN, A.Fib, OSA, and stage 4 Breast Cancer with IV chemotherapy every 3 weeks. Pre-Op Sodium result 2.9  Patient denies shortness of breath, fever, cough and chest pain at PAT appointment. Pt denies any respiratory illness/infection in the last two months.    All instructions explained to the patient, with a verbal understanding of the material. Patient agrees to go over the instructions while at home for a better understanding. Patient also instructed to self quarantine after being tested for COVID-19. The opportunity to ask questions was provided.

## 2023-12-18 ENCOUNTER — Telehealth: Payer: Self-pay

## 2023-12-18 NOTE — Anesthesia Preprocedure Evaluation (Addendum)
 Anesthesia Evaluation  Patient identified by MRN, date of birth, ID band Patient awake    Reviewed: Allergy & Precautions, NPO status , Patient's Chart, lab work & pertinent test results  Airway Mallampati: II  TM Distance: >3 FB Neck ROM: Limited    Dental no notable dental hx. (+) Teeth Intact, Dental Advisory Given, Caps   Pulmonary asthma , sleep apnea , pneumonia, resolved, former smoker   Pulmonary exam normal breath sounds clear to auscultation       Cardiovascular hypertension, Pt. on medications Normal cardiovascular exam+ dysrhythmias Atrial Fibrillation  Rhythm:Regular Rate:Normal  Echo 09/26/23 1. Atrial fibrillation with a controlled rate      . Left ventricular ejection fraction, by estimation, is 55 to 60%. The  left ventricle has normal function. The left ventricle has no regional  wall motion abnormalities. Left ventricular diastolic parameters are  indeterminate. Elevated left  ventricular end-diastolic pressure.   2. Right ventricular systolic function is normal. The right ventricular  size is normal. There is moderately elevated pulmonary artery systolic  pressure.   3. The mitral valve is normal in structure. Mild mitral valve  regurgitation. No evidence of mitral stenosis.   4. Tricuspid valve regurgitation is moderate.   5. The aortic valve is tricuspid. Aortic valve regurgitation is mild.  Aortic valve sclerosis is present, with no evidence of aortic valve  stenosis.   6. Aortic DTA is normal.   7. Elevated LA pressure      . The inferior vena cava is normal in size with greater than 50%  respiratory variability, suggesting right atrial pressure of 3 mmHg.      Neuro/Psych  PSYCHIATRIC DISORDERS Anxiety Depression       GI/Hepatic negative GI ROS,,,(+) Hepatitis -  Endo/Other  Hypothyroidism  Hx/o breast Ca Hyperlipidemia Hx/o Grave's disease  Renal/GU   negative genitourinary    Musculoskeletal  (+) Arthritis , Osteoarthritis,  Post traumatic arthritis right hip Cervical spinal stenosis   Abdominal   Peds  Hematology  (+) Blood dyscrasia, anemia Eliquis  last dose   Anesthesia Other Findings   Reproductive/Obstetrics                             Anesthesia Physical Anesthesia Plan  ASA: 3  Anesthesia Plan: Spinal   Post-op Pain Management: Minimal or no pain anticipated   Induction: Intravenous  PONV Risk Score and Plan: 3 and Propofol  infusion and Treatment may vary due to age or medical condition  Airway Management Planned: Natural Airway and Simple Face Mask  Additional Equipment: None  Intra-op Plan:   Post-operative Plan:   Informed Consent: I have reviewed the patients History and Physical, chart, labs and discussed the procedure including the risks, benefits and alternatives for the proposed anesthesia with the patient or authorized representative who has indicated his/her understanding and acceptance.     Dental advisory given  Plan Discussed with: CRNA and Anesthesiologist  Anesthesia Plan Comments: (PAT note by Rudy Costain, PA-C:  78 year old female with pertinent history including HTN and atrial fibrillation on Eliquis .  Echo 03/2023 showed EF 50 to 55%, moderately elevated PASP, mild MR. Last seen by Palmer Bobo, NP on 11/29/2023 for preop eval.  Per note, "According to the Revised Cardiac Risk Index (RCRI), her Perioperative Risk of Major Cardiac Event is (%): 0.4. Her Functional Capacity in METs is: 4.4 according to the Duke Activity Status Index (DASI). Therefore, based on ACC/AHA guidelines, patient would  be at acceptable risk for the planned procedure without further cardiovascular testing. The patient was advised that if she develops new symptoms prior to surgery to contact our office to arrange for a follow-up visit, and she verbalized understanding. Per office protocol, patient can hold Eliquis  for 3  days prior to procedure. Patient will not need bridging with Lovenox  (enoxaparin ) around procedure."  Follows with oncology for history of breast cancer stage IV with mets to liver and lung, diagnosed 2003.  Per last note 12/01/2023, she is currently on trastuzumab  and her disease is under control.  Other pertinent history includes hypothyroidism, C3-4 ACDF 2015, OSA intolerant to CPAP, chronic hyponatremia.  Recently underwent ORIF of acetabular fracture on 07/03/2023 without complication.  Patient reports last dose of Eliquis  12/17/2023.  Preop labs reviewed, moderate hyponatremia sodium 129 (baseline appears to be ~130), otherwise unremarkable.  EKG 06/30/2023: Right and left arm electrode reversal, interpretation assumes no reversal.  Sinus rhythm.  Rate 80.  PAC.  Right axis deviation.  Low voltage, precordial leads.  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  TTE 09/26/2023: 1. Atrial fibrillation with a controlled rate   . Left ventricular ejection fraction, by estimation, is 55 to 60%. The  left ventricle has normal function. The left ventricle has no regional  wall motion abnormalities. Left ventricular diastolic parameters are  indeterminate. Elevated left  ventricular end-diastolic pressure.  2. Right ventricular systolic function is normal. The right ventricular  size is normal. There is moderately elevated pulmonary artery systolic  pressure.  3. The mitral valve is normal in structure. Mild mitral valve  regurgitation. No evidence of mitral stenosis.  4. Tricuspid valve regurgitation is moderate.  5. The aortic valve is tricuspid. Aortic valve regurgitation is mild.  Aortic valve sclerosis is present, with no evidence of aortic valve  stenosis.  6. Aortic DTA is normal.  7. Elevated LA pressure   . The inferior vena cava is normal in size with greater than 50%  respiratory variability, suggesting right atrial pressure of 3 mmHg.   )        Anesthesia Quick  Evaluation

## 2023-12-18 NOTE — Progress Notes (Signed)
 Anesthesia Chart Review:  78 year old female with pertinent history including HTN and atrial fibrillation on Eliquis .  Echo 03/2023 showed EF 50 to 55%, moderately elevated PASP, mild MR. Last seen by Karen Bobo, NP on 11/29/2023 for preop eval.  Per note, "According to the Revised Cardiac Risk Index (RCRI), her Perioperative Risk of Major Cardiac Event is (%): 0.4. Her Functional Capacity in METs is: 4.4 according to the Duke Activity Status Index (DASI). Therefore, based on ACC/AHA guidelines, patient would be at acceptable risk for the planned procedure without further cardiovascular testing. The patient was advised that if she develops new symptoms prior to surgery to contact our office to arrange for a follow-up visit, and she verbalized understanding. Per office protocol, patient can hold Eliquis  for 3 days prior to procedure. Patient will not need bridging with Lovenox  (enoxaparin ) around procedure."  Follows with oncology for history of breast cancer stage IV with mets to liver and lung, diagnosed 2003.  Per last note 12/01/2023, she is currently on trastuzumab  and her disease is under control.  Other pertinent history includes hypothyroidism, C3-4 ACDF 2015, OSA intolerant to CPAP, chronic hyponatremia.  Recently underwent ORIF of acetabular fracture on 07/03/2023 without complication.  Patient reports last dose of Eliquis  12/17/2023.  Preop labs reviewed, moderate hyponatremia sodium 129 (baseline appears to be ~130), otherwise unremarkable.  EKG 06/30/2023: Right and left arm electrode reversal, interpretation assumes no reversal.  Sinus rhythm.  Rate 80.  PAC.  Right axis deviation.  Low voltage, precordial leads.  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  TTE 09/26/2023:  1. Atrial fibrillation with a controlled rate      . Left ventricular ejection fraction, by estimation, is 55 to 60%. The  left ventricle has normal function. The left ventricle has no regional  wall motion abnormalities. Left  ventricular diastolic parameters are  indeterminate. Elevated left  ventricular end-diastolic pressure.   2. Right ventricular systolic function is normal. The right ventricular  size is normal. There is moderately elevated pulmonary artery systolic  pressure.   3. The mitral valve is normal in structure. Mild mitral valve  regurgitation. No evidence of mitral stenosis.   4. Tricuspid valve regurgitation is moderate.   5. The aortic valve is tricuspid. Aortic valve regurgitation is mild.  Aortic valve sclerosis is present, with no evidence of aortic valve  stenosis.   6. Aortic DTA is normal.   7. Elevated LA pressure      . The inferior vena cava is normal in size with greater than 50%  respiratory variability, suggesting right atrial pressure of 3 mmHg.     Karen Allison Karen Allison Karen Allison Phone [phone] 12/18/2023 2:03 PM

## 2023-12-18 NOTE — Telephone Encounter (Signed)
 Spoke with patient. Said that she had already spoke with Dr. Curtiss Dowdy office and they are ordering machine.

## 2023-12-18 NOTE — Telephone Encounter (Signed)
 Patient left message asking about hip cryotherapy unit.  Please call.

## 2023-12-19 DIAGNOSIS — S32401D Unspecified fracture of right acetabulum, subsequent encounter for fracture with routine healing: Secondary | ICD-10-CM | POA: Diagnosis not present

## 2023-12-19 DIAGNOSIS — M1711 Unilateral primary osteoarthritis, right knee: Secondary | ICD-10-CM | POA: Diagnosis not present

## 2023-12-19 DIAGNOSIS — M79604 Pain in right leg: Secondary | ICD-10-CM | POA: Diagnosis not present

## 2023-12-20 ENCOUNTER — Encounter (HOSPITAL_COMMUNITY): Payer: Self-pay | Admitting: Student

## 2023-12-20 ENCOUNTER — Inpatient Hospital Stay

## 2023-12-20 ENCOUNTER — Telehealth: Payer: Self-pay

## 2023-12-20 NOTE — H&P (Signed)
 [facility]uma Service (OTS) H&P  Patient ID: Karen Allison MRN: [id number] DOB/AGE: [birth year] 80 y.o.  Reason for surgery: Posttraumatic arthritis right hip   HPI: Karen Allison is a 78 y.o. female with past medical history significant of atrial fibrillation on Eliquis , hypertension, stage IV triple positive breast cancer, hypothyroidism presenting for surgery on right lower extremity.  Patient sustained a fall in November 2024, resulting in right acetabular fracture with protrusio of the femoral head.  Degenerative changes of the right hip joint were noted at the time of initial injury.  Patient underwent ORIF of the right anterior column/posterior hemitransverse acetabular fracture by Dr. Curtiss Dowdy on 07/03/2023.  Over the last 4 to 5 months, patient has made progress and has advanced to weightbearing on the right lower extremity.  Unfortunately, patient has had worsening of the pre-existing arthritis in the right hip joint.  This is significantly affecting her activities of daily living.  She has failed conservative management the hip arthritis.  She presents now for hip replacement surgery. Patient does have a history of atrial fibrillation and is currently on Eliquis  2.5 mg twice daily.  Last Eliquis  dose 12/17/2023.  She lives in a two-level home with her spouse.  Her bed and bath are on the main level.  Does have a ramped entrance.  She alternates between a rollator walker and a single-point cane for ambulation.  Past Medical History:  Diagnosis Date   A-fib [facility])    Acute respiratory failure with hypoxia (HCC) 05/09/2014   Adult hypothyroidism 05/03/2013   Anemia    Iron  Deficiency   Anxiety    Arthritis    Asthma    As a child   Breast cancer (HCC)    left   Cervical spondylosis without myelopathy 05/06/2014   C3-4     Depression    Dysrhythmia    A. Fib   Endometrial cancer (HCC) 01/21/1988   Fx lower humerus-closed 10/22/1997   Gait disorder 07/05/2016    Graves' disease 07/16/2021   HCAP (healthcare-associated pneumonia) 05/09/2014   Hepatitis     Hep A years ago   History of corticosteroid therapy 02/13/2023   History of endocrine disorder 02/13/2023   HNP (herniated nucleus pulposus), cervical 05/06/2014   Hypertension    Hyponatremia 12/02/2021   Hypothyroidism    Graves disease, age 8 yo   Malignant neoplasm of left breast (HCC) 06/05/2020   Nose trouble    right nostril will hemorrhage at times   Osteoporosis 11/04/2020   Other forms of scoliosis, thoracolumbar region 11/03/2016   Pneumonia    Rib pain on left side 09/26/2022   Rosacea    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   Overview:   Due to steroids and resolved with normal cortisol stimulation in 2011     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HCC)    Sleep apnea 2021   Pt unable to tolerate CPAP   Spinal stenosis in cervical region 05/06/2014   C3-4     Past Surgical History:  Procedure Laterality Date   ABDOMINAL HYSTERECTOMY     Age 7   ANTERIOR CERVICAL DECOMP/DISCECTOMY FUSION N/A 05/06/2014   Procedure: ANTERIOR CERVICAL DISCECTOMY FUSION C3-4 with transgraft, local bone graft, plate and screws;  Surgeon: Alphonso Jean, MD;  Location: [facility] OR;  Service: Orthopedics;  Laterality: N/A;   APPENDECTOMY     BREAST LUMPECTOMY Left    COLONOSCOPY     EYE SURGERY Bilateral    cataracts   LUMBAR SPINE  SURGERY  2011   Removal of hematoma   OPEN REDUCTION INTERNAL FIXATION ACETABULAR FRACTURE STOPPA Right 07/03/2023   Procedure: OPEN REDUCTION INTERNAL FIXATION ACETABULAR FRACTURE STOPPA;  Surgeon: Laneta Pintos, MD;  Location: [facility] OR;  Service: Orthopedics;  Laterality: Right;   ORIF HUMERUS FRACTURE Left 2013   THORACIC SPINE SURGERY  2011   Bone cages   THYROIDECTOMY     85% removed due to Grave's Disease   TONSILLECTOMY     Family History  Problem Relation Age of Onset   Congestive Heart Failure Mother    Heart disease Father    Rheum arthritis  Daughter    Tongue cancer Grandchild    Breast cancer Neg Hx     Social History:  reports that she quit smoking about 61 years ago. Her smoking use included cigarettes. She has never used smokeless tobacco. She reports that she does not currently use alcohol . She reports that she does not use drugs.  Allergies:  Allergies  Allergen Reactions   Cefuroxime Hives   Zithromax [Azithromycin] Hives    Medications: Prior to Admission medications   Medication Sig Start Date End Date Taking? Authorizing Provider  acetaminophen  (TYLENOL ) 325 MG tablet Take 2 tablets (650 mg total) by mouth 3 (three) times daily as needed for mild pain (pain score 1-3). 07/06/23  Yes Hongalgi, Thomasene Flemings, MD  apixaban  (ELIQUIS ) 5 MG TABS tablet Take 1/2 tablet twice daily 07/25/23  Yes Hassan Links, MD  B Complex Vitamins (B COMPLEX PO) Take 1 tablet by mouth daily.   Yes [provider]  Calcium  Carbonate-Vitamin D  (CALTRATE 600+D PO) Take 1 tablet by mouth 2 (two) times daily.   Yes [provider]  cholecalciferol  (VITAMIN D3) 25 MCG (1000 UNIT) tablet Take 1 tablet (1,000 Units total) by mouth daily. 07/18/23  Yes Angiulli, Everlyn Hockey, PA-C  denosumab  (PROLIA ) 60 MG/ML SOSY injection Inject 60 mg into the skin every 6 (six) months.   Yes [provider]  diazepam  (VALIUM ) 10 MG tablet Take 1 tablet (10 mg total) by mouth at bedtime as needed. Patient taking differently: Take 5-10 mg by mouth at bedtime as needed for sleep. 12/08/23  Yes   diclofenac  Sodium (VOLTAREN ) 1 % GEL Apply 1 Application topically 4 (four) times daily as needed (pain).   Yes [provider]  docusate sodium  (COLACE) 100 MG capsule Take 2 capsules (200 mg total) by mouth daily after supper. Patient taking differently: Take 200 mg by mouth at bedtime. 07/18/23  Yes Angiulli, Everlyn Hockey, PA-C  doxycycline  (VIBRAMYCIN ) 50 MG capsule Take 50 mg by mouth daily as needed (rosacea). Patient not taking: Reported on  12/15/2023   Yes [provider]  estradiol  (ESTRACE ) 0.1 MG/GM vaginal cream Place 1 Applicatorful vaginally 2 (two) times a week. 07/06/23  Yes Hongalgi, Anand D, MD  fluconazole (DIFLUCAN) 150 MG tablet Take 150 mg by mouth daily as needed (irritation).   Yes [provider]  furosemide  (LASIX ) 20 MG tablet Take 20 mg by mouth as needed for edema or fluid. 07/26/22  Yes [provider]  gabapentin  (NEURONTIN ) 600 MG tablet Take 600-1,200 mg by mouth See admin instructions. 300 mg three times daily, 1200 mg at bedtime   Yes [provider]  HYDROcodone -acetaminophen  (NORCO) 10-325 MG tablet Take 1 tablet by mouth every 4 (four) to 6 (six) hours as needed. 12/07/23  Yes   levothyroxine  (SYNTHROID ) 50 MCG tablet Take 1 tablet (50 mcg total) by mouth  daily. 07/18/23  Yes Angiulli, Everlyn Hockey, PA-C  methocarbamol  (ROBAXIN ) 500 MG tablet Take 1 tablet (500 mg total) by mouth 4 (four) times daily. 07/18/23  Yes Angiulli, Everlyn Hockey, PA-C  metoprolol  succinate (TOPROL -XL) 25 MG 24 hr tablet Take 1 tablet (25 mg total) by mouth daily. 11/30/23  Yes Terrance Ferretti, NP  Multiple Vitamin (MULTIVITAMIN WITH MINERALS) TABS Take 1 tablet by mouth daily.   Yes [provider]  omeprazole  (PRILOSEC) 20 MG capsule Take 1 capsule (20 mg total) by mouth daily. Patient taking differently: Take 20 mg by mouth daily as needed (acid refux). 07/18/23  Yes Angiulli, Everlyn Hockey, PA-C  ondansetron  (ZOFRAN ) 8 MG tablet Take 8 mg by mouth every 8 (eight) hours as needed for nausea or vomiting.   Yes [provider]  Potassium Chloride  ER 20 MEQ TBCR Take 20 mEq by mouth daily. Only takes when taking her fluid pill 08/03/23  Yes [provider]  rosuvastatin  (CRESTOR ) 10 MG tablet Take 10 mg by mouth once a week. Fridays 07/27/22  Yes [provider]  sennosides-docusate sodium  (SENOKOT-S) 8.6-50 MG tablet Take 2 tablets by mouth 2 (two) times daily. Patient taking  differently: Take 3-4 tablets by mouth at bedtime. 07/06/23  Yes Hongalgi, Thomasene Flemings, MD  tamsulosin  (FLOMAX ) 0.4 MG CAPS capsule Take 1 capsule (0.4 mg total) by mouth every evening. 07/18/23  Yes Angiulli, Everlyn Hockey, PA-C  trastuzumab  (HERCEPTIN ) 440 MG injection 440 mg by Intravenous (Continuous Infusion) route every 21 ( twenty-one) days. infusion every 3 weeks 03/19/09  Yes [provider]  traZODone  (DESYREL ) 50 MG tablet Take 50 mg by mouth at bedtime as needed for sleep.   Yes [provider]   I have reviewed the patient's current medications.  Positive ROS: All other systems have been reviewed and were otherwise negative with the exception of those mentioned in the HPI and as above.  Exam: There were no vitals taken for this visit. General: Alert and oriented, no acute distress Cardiovascular: No pedal edema Respiratory: No cyanosis, no use of accessory musculature GI: No organomegaly, abdomen is soft and non-tender Skin: No lesions in the area of chief complaint Neurologic: Sensation intact distally Psychiatric: Patient is competent for consent with normal mood and affect  Musculoskeletal: Right lower extremity: Well-healed Pfannenstiel incision to the anterior pelvis.  No significant tenderness with palpation over the lateral hip or throughout the thigh.  Nontender to the knee or throughout the lower leg, ankle, foot.  Patient able to flex and extend the hip but does endorse discomfort with this.  Able to get full knee extension.  Ankle dorsiflexion and plantarflexion are intact.  Sensory function at baseline.  Neurovascularly at baseline.  Left lower extremity: Skin without lesions. No tenderness to palpation. Full painless ROM, full strength in each muscle group without evidence of instability. Motor/sensory function at baseline. Neurovascularly intact.   Medical Decision Making: Data: Imaging: X-ray and CT scan of the right hip demonstrate significant joint  space narrowing of the right femoral acetabular joint with associated cortical flattening, remodeling, and erosion of the femoral head   Labs: No results found for this or any previous visit (from the past 24 hours).   Medical history and chart was reviewed and case discussed with attending provider.  Assessment/Plan: 79 year old female s/p ORIF right anterior column/posterior hemitransverse acetabular fracture on 07/03/2023 following a fall.  Patient now presenting with progression of pre-existing right hip arthritis   Patient has unfortunately  had worsening of her pre-existing right hip osteoarthritis following her acetabular fixation.  Patient notes significant pain in the right hip joint that has limited her ability to perform daily activities.  She has failed conservative management including NSAIDs and physical therapy.  At this point, I would recommend moving forward with hip replacement surgery.  A lengthy discussion has been had with patient and her family regarding the surgical approach and anticipated postoperative course.  Risk and benefits of procedure have been discussed with the patient.  She agrees to proceed with surgery.  Consent will be obtained.  We will plan to admit the patient postoperatively for pain control and therapies.   [name] PA-C [facility] [phone] (office) [email or web address]

## 2023-12-21 ENCOUNTER — Other Ambulatory Visit: Payer: Self-pay

## 2023-12-21 ENCOUNTER — Inpatient Hospital Stay (HOSPITAL_COMMUNITY)
Admission: RE | Admit: 2023-12-21 | Discharge: 2023-12-23 | DRG: 464 | Disposition: A | Discharge status: Home Health | Attending: Student | Admitting: Student

## 2023-12-21 ENCOUNTER — Inpatient Hospital Stay (HOSPITAL_COMMUNITY): Payer: Self-pay | Admitting: Certified Registered Nurse Anesthetist

## 2023-12-21 ENCOUNTER — Encounter (HOSPITAL_COMMUNITY)
Admission: RE | Disposition: A | Discharge status: Home Health | Payer: Self-pay | Source: Home / Self Care | Attending: Student

## 2023-12-21 ENCOUNTER — Inpatient Hospital Stay (HOSPITAL_COMMUNITY)

## 2023-12-21 ENCOUNTER — Encounter (HOSPITAL_COMMUNITY): Payer: Self-pay | Admitting: Student

## 2023-12-21 ENCOUNTER — Encounter: Payer: Self-pay | Admitting: Oncology

## 2023-12-21 ENCOUNTER — Other Ambulatory Visit: Payer: Self-pay | Admitting: Pharmacist

## 2023-12-21 ENCOUNTER — Inpatient Hospital Stay (HOSPITAL_COMMUNITY): Payer: Self-pay | Admitting: Physician Assistant

## 2023-12-21 DIAGNOSIS — E871 Hypo-osmolality and hyponatremia: Secondary | ICD-10-CM | POA: Diagnosis present

## 2023-12-21 DIAGNOSIS — Z87891 Personal history of nicotine dependence: Secondary | ICD-10-CM

## 2023-12-21 DIAGNOSIS — Z79890 Hormone replacement therapy (postmenopausal): Secondary | ICD-10-CM | POA: Diagnosis not present

## 2023-12-21 DIAGNOSIS — C787 Secondary malignant neoplasm of liver and intrahepatic bile duct: Secondary | ICD-10-CM | POA: Diagnosis not present

## 2023-12-21 DIAGNOSIS — Z9181 History of falling: Secondary | ICD-10-CM | POA: Diagnosis not present

## 2023-12-21 DIAGNOSIS — Z8542 Personal history of malignant neoplasm of other parts of uterus: Secondary | ICD-10-CM | POA: Diagnosis not present

## 2023-12-21 DIAGNOSIS — Z7901 Long term (current) use of anticoagulants: Secondary | ICD-10-CM | POA: Diagnosis not present

## 2023-12-21 DIAGNOSIS — M810 Age-related osteoporosis without current pathological fracture: Secondary | ICD-10-CM | POA: Diagnosis present

## 2023-12-21 DIAGNOSIS — Z79891 Long term (current) use of opiate analgesic: Secondary | ICD-10-CM | POA: Diagnosis not present

## 2023-12-21 DIAGNOSIS — Z9049 Acquired absence of other specified parts of digestive tract: Secondary | ICD-10-CM | POA: Diagnosis not present

## 2023-12-21 DIAGNOSIS — Z90710 Acquired absence of both cervix and uterus: Secondary | ICD-10-CM | POA: Diagnosis not present

## 2023-12-21 DIAGNOSIS — M1611 Unilateral primary osteoarthritis, right hip: Secondary | ICD-10-CM

## 2023-12-21 DIAGNOSIS — M21751 Unequal limb length (acquired), right femur: Secondary | ICD-10-CM | POA: Diagnosis present

## 2023-12-21 DIAGNOSIS — R609 Edema, unspecified: Secondary | ICD-10-CM | POA: Diagnosis not present

## 2023-12-21 DIAGNOSIS — K5909 Other constipation: Secondary | ICD-10-CM | POA: Diagnosis not present

## 2023-12-21 DIAGNOSIS — M1651 Unilateral post-traumatic osteoarthritis, right hip: Secondary | ICD-10-CM | POA: Diagnosis not present

## 2023-12-21 DIAGNOSIS — K219 Gastro-esophageal reflux disease without esophagitis: Secondary | ICD-10-CM | POA: Diagnosis not present

## 2023-12-21 DIAGNOSIS — Z8249 Family history of ischemic heart disease and other diseases of the circulatory system: Secondary | ICD-10-CM | POA: Diagnosis not present

## 2023-12-21 DIAGNOSIS — M80051D Age-related osteoporosis with current pathological fracture, right femur, subsequent encounter for fracture with routine healing: Secondary | ICD-10-CM | POA: Diagnosis not present

## 2023-12-21 DIAGNOSIS — G4730 Sleep apnea, unspecified: Secondary | ICD-10-CM | POA: Diagnosis present

## 2023-12-21 DIAGNOSIS — E039 Hypothyroidism, unspecified: Secondary | ICD-10-CM | POA: Diagnosis not present

## 2023-12-21 DIAGNOSIS — S32401A Unspecified fracture of right acetabulum, initial encounter for closed fracture: Secondary | ICD-10-CM | POA: Diagnosis present

## 2023-12-21 DIAGNOSIS — M47812 Spondylosis without myelopathy or radiculopathy, cervical region: Secondary | ICD-10-CM | POA: Diagnosis present

## 2023-12-21 DIAGNOSIS — M4802 Spinal stenosis, cervical region: Secondary | ICD-10-CM | POA: Diagnosis not present

## 2023-12-21 DIAGNOSIS — E0500 Thyrotoxicosis with diffuse goiter without thyrotoxic crisis or storm: Secondary | ICD-10-CM | POA: Diagnosis not present

## 2023-12-21 DIAGNOSIS — I10 Essential (primary) hypertension: Secondary | ICD-10-CM | POA: Diagnosis present

## 2023-12-21 DIAGNOSIS — Z79899 Other long term (current) drug therapy: Secondary | ICD-10-CM | POA: Diagnosis not present

## 2023-12-21 DIAGNOSIS — J45909 Unspecified asthma, uncomplicated: Secondary | ICD-10-CM

## 2023-12-21 DIAGNOSIS — M12551 Traumatic arthropathy, right hip: Secondary | ICD-10-CM | POA: Diagnosis present

## 2023-12-21 DIAGNOSIS — I4891 Unspecified atrial fibrillation: Secondary | ICD-10-CM | POA: Diagnosis not present

## 2023-12-21 DIAGNOSIS — M2419 Other articular cartilage disorders, other specified site: Secondary | ICD-10-CM

## 2023-12-21 DIAGNOSIS — C7800 Secondary malignant neoplasm of unspecified lung: Secondary | ICD-10-CM | POA: Diagnosis not present

## 2023-12-21 DIAGNOSIS — Z96641 Presence of right artificial hip joint: Secondary | ICD-10-CM | POA: Diagnosis not present

## 2023-12-21 DIAGNOSIS — M5020 Other cervical disc displacement, unspecified cervical region: Secondary | ICD-10-CM | POA: Diagnosis present

## 2023-12-21 DIAGNOSIS — C50912 Malignant neoplasm of unspecified site of left female breast: Secondary | ICD-10-CM

## 2023-12-21 DIAGNOSIS — M259 Joint disorder, unspecified: Secondary | ICD-10-CM

## 2023-12-21 DIAGNOSIS — M4135 Thoracogenic scoliosis, thoracolumbar region: Secondary | ICD-10-CM | POA: Diagnosis not present

## 2023-12-21 DIAGNOSIS — E785 Hyperlipidemia, unspecified: Secondary | ICD-10-CM | POA: Diagnosis not present

## 2023-12-21 DIAGNOSIS — Z471 Aftercare following joint replacement surgery: Secondary | ICD-10-CM | POA: Diagnosis not present

## 2023-12-21 DIAGNOSIS — I4892 Unspecified atrial flutter: Secondary | ICD-10-CM | POA: Diagnosis not present

## 2023-12-21 DIAGNOSIS — E890 Postprocedural hypothyroidism: Secondary | ICD-10-CM | POA: Diagnosis present

## 2023-12-21 DIAGNOSIS — Z981 Arthrodesis status: Secondary | ICD-10-CM | POA: Diagnosis not present

## 2023-12-21 DIAGNOSIS — C50919 Malignant neoplasm of unspecified site of unspecified female breast: Secondary | ICD-10-CM | POA: Diagnosis not present

## 2023-12-21 DIAGNOSIS — D62 Acute posthemorrhagic anemia: Secondary | ICD-10-CM | POA: Diagnosis not present

## 2023-12-21 DIAGNOSIS — Z853 Personal history of malignant neoplasm of breast: Secondary | ICD-10-CM

## 2023-12-21 DIAGNOSIS — S32401D Unspecified fracture of right acetabulum, subsequent encounter for fracture with routine healing: Principal | ICD-10-CM

## 2023-12-21 DIAGNOSIS — G4700 Insomnia, unspecified: Secondary | ICD-10-CM | POA: Diagnosis not present

## 2023-12-21 DIAGNOSIS — Z888 Allergy status to other drugs, medicaments and biological substances status: Secondary | ICD-10-CM

## 2023-12-21 DIAGNOSIS — Z8701 Personal history of pneumonia (recurrent): Secondary | ICD-10-CM | POA: Diagnosis not present

## 2023-12-21 DIAGNOSIS — S72001A Fracture of unspecified part of neck of right femur, initial encounter for closed fracture: Secondary | ICD-10-CM | POA: Diagnosis not present

## 2023-12-21 DIAGNOSIS — Z881 Allergy status to other antibiotic agents status: Secondary | ICD-10-CM

## 2023-12-21 DIAGNOSIS — G8929 Other chronic pain: Secondary | ICD-10-CM | POA: Diagnosis not present

## 2023-12-21 HISTORY — DX: Unilateral primary osteoarthritis, right hip: M16.11

## 2023-12-21 HISTORY — PX: CONVERSION TO TOTAL HIP: SHX5784

## 2023-12-21 SURGERY — CONVERSION, PREVIOUS HIP SURGERY, TO TOTAL HIP ARTHROPLASTY
Anesthesia: Spinal | Site: Hip | Laterality: Right

## 2023-12-21 MED ORDER — PANTOPRAZOLE SODIUM 40 MG PO TBEC
40.0000 mg | DELAYED_RELEASE_TABLET | Freq: Every day | ORAL | Status: DC
  Administered 2023-12-22 – 2023-12-23 (×2): 40 mg via ORAL
  Filled 2023-12-21 (×3): qty 1

## 2023-12-21 MED ORDER — FUROSEMIDE 20 MG PO TABS: 20.0000 mg | ORAL_TABLET | ORAL | Status: DC | PRN

## 2023-12-21 MED ORDER — HYDROMORPHONE HCL 1 MG/ML IJ SOLN
INTRAMUSCULAR | Status: AC
  Filled 2023-12-21: qty 1

## 2023-12-21 MED ORDER — ROCURONIUM BROMIDE 10 MG/ML (PF) SYRINGE
PREFILLED_SYRINGE | INTRAVENOUS | Status: AC
  Filled 2023-12-21: qty 10

## 2023-12-21 MED ORDER — ROSUVASTATIN CALCIUM 5 MG PO TABS
10.0000 mg | ORAL_TABLET | ORAL | Status: DC
Start: 2023-12-21 — End: 2023-12-23
  Administered 2023-12-22: 10 mg via ORAL
  Filled 2023-12-21: qty 2

## 2023-12-21 MED ORDER — BUPIVACAINE IN DEXTROSE 0.75-8.25 % IT SOLN
INTRATHECAL | Status: DC | PRN
  Administered 2023-12-21: 1.7 mL via INTRATHECAL

## 2023-12-21 MED ORDER — GABAPENTIN 300 MG PO CAPS
300.0000 mg | ORAL_CAPSULE | Freq: Three times a day (TID) | ORAL | Status: DC
  Administered 2023-12-21 – 2023-12-23 (×5): 300 mg via ORAL
  Filled 2023-12-21 (×5): qty 1

## 2023-12-21 MED ORDER — PROPOFOL 500 MG/50ML IV EMUL
INTRAVENOUS | Status: DC | PRN
  Administered 2023-12-21: 100 ug/kg/min via INTRAVENOUS

## 2023-12-21 MED ORDER — DIAZEPAM 5 MG PO TABS
5.0000 mg | ORAL_TABLET | Freq: Every evening | ORAL | Status: DC | PRN
  Filled 2023-12-21: qty 1

## 2023-12-21 MED ORDER — PHENYLEPHRINE HCL-NACL 20-0.9 MG/250ML-% IV SOLN
INTRAVENOUS | Status: DC | PRN
  Administered 2023-12-21: 30 ug/min via INTRAVENOUS

## 2023-12-21 MED ORDER — DOCUSATE SODIUM 100 MG PO CAPS
200.0000 mg | ORAL_CAPSULE | Freq: Every day | ORAL | Status: DC
  Administered 2023-12-21: 200 mg via ORAL
  Filled 2023-12-21: qty 2

## 2023-12-21 MED ORDER — VANCOMYCIN HCL IN DEXTROSE 1-5 GM/200ML-% IV SOLN
INTRAVENOUS | Status: AC
Start: 2023-12-21 — End: 2023-12-21
  Administered 2023-12-21: 1000 mg via INTRAVENOUS
  Filled 2023-12-21: qty 200

## 2023-12-21 MED ORDER — ACETAMINOPHEN 325 MG PO TABS: 650.0000 mg | ORAL_TABLET | Freq: Four times a day (QID) | ORAL | Status: DC | PRN

## 2023-12-21 MED ORDER — ONDANSETRON HCL 4 MG/2ML IJ SOLN
INTRAMUSCULAR | Status: DC | PRN
  Administered 2023-12-21: 4 mg via INTRAVENOUS

## 2023-12-21 MED ORDER — DEXAMETHASONE SODIUM PHOSPHATE 10 MG/ML IJ SOLN
INTRAMUSCULAR | Status: AC
Start: 2023-12-21 — End: ?
  Filled 2023-12-21: qty 1

## 2023-12-21 MED ORDER — GABAPENTIN 400 MG PO CAPS
1200.0000 mg | ORAL_CAPSULE | Freq: Every day | ORAL | Status: DC
  Administered 2023-12-21 – 2023-12-22 (×2): 1200 mg via ORAL
  Filled 2023-12-21 (×2): qty 3

## 2023-12-21 MED ORDER — SODIUM CHLORIDE 0.9 % IR SOLN
Status: DC | PRN
  Administered 2023-12-21: 1000 mL

## 2023-12-21 MED ORDER — GABAPENTIN 600 MG PO TABS
600.0000 mg | ORAL_TABLET | ORAL | Status: DC
Start: 2023-12-21 — End: 2023-12-21

## 2023-12-21 MED ORDER — ORAL CARE MOUTH RINSE: 15.0000 mL | Freq: Once | OROMUCOSAL | Status: AC

## 2023-12-21 MED ORDER — ONDANSETRON HCL 4 MG/2ML IJ SOLN
4.0000 mg | Freq: Four times a day (QID) | INTRAMUSCULAR | Status: DC | PRN
Start: 2023-12-21 — End: 2023-12-23

## 2023-12-21 MED ORDER — SODIUM CHLORIDE 0.9% FLUSH: 3.0000 mL | INTRAVENOUS | Status: DC | PRN

## 2023-12-21 MED ORDER — SENNOSIDES-DOCUSATE SODIUM 8.6-50 MG PO TABS
1.0000 | ORAL_TABLET | Freq: Two times a day (BID) | ORAL | Status: DC
  Administered 2023-12-21 – 2023-12-22 (×2): 1 via ORAL
  Filled 2023-12-21 (×2): qty 1

## 2023-12-21 MED ORDER — SODIUM CHLORIDE 0.9% FLUSH
3.0000 mL | Freq: Two times a day (BID) | INTRAVENOUS | Status: DC
  Administered 2023-12-21 – 2023-12-22 (×2): 3 mL via INTRAVENOUS

## 2023-12-21 MED ORDER — PHENOL 1.4 % MT LIQD: 1.0000 | OROMUCOSAL | Status: DC | PRN

## 2023-12-21 MED ORDER — METOCLOPRAMIDE HCL 5 MG/ML IJ SOLN: 5.0000 mg | Freq: Three times a day (TID) | INTRAMUSCULAR | Status: DC | PRN

## 2023-12-21 MED ORDER — FENTANYL CITRATE (PF) 250 MCG/5ML IJ SOLN
INTRAMUSCULAR | Status: DC | PRN
Start: 2023-12-21 — End: 2023-12-21
  Administered 2023-12-21: 50 ug via INTRAVENOUS

## 2023-12-21 MED ORDER — VANCOMYCIN HCL 1000 MG IV SOLR
INTRAVENOUS | Status: DC | PRN
  Administered 2023-12-21: 1000 mg

## 2023-12-21 MED ORDER — LEVOFLOXACIN IN D5W 500 MG/100ML IV SOLN
500.0000 mg | INTRAVENOUS | Status: AC
  Administered 2023-12-21: 500 mg via INTRAVENOUS

## 2023-12-21 MED ORDER — FENTANYL CITRATE (PF) 100 MCG/2ML IJ SOLN
50.0000 ug | Freq: Once | INTRAMUSCULAR | Status: AC
  Administered 2023-12-21: 50 ug via INTRAVENOUS

## 2023-12-21 MED ORDER — LEVOFLOXACIN IN D5W 500 MG/100ML IV SOLN
INTRAVENOUS | Status: AC
  Filled 2023-12-21: qty 100

## 2023-12-21 MED ORDER — CHLORHEXIDINE GLUCONATE 0.12 % MT SOLN
15.0000 mL | Freq: Once | OROMUCOSAL | Status: AC
Start: 2023-12-21 — End: 2023-12-21

## 2023-12-21 MED ORDER — OXYCODONE HCL 5 MG PO TABS
10.0000 mg | ORAL_TABLET | ORAL | Status: DC | PRN
  Administered 2023-12-21: 15 mg via ORAL
  Administered 2023-12-22 (×2): 10 mg via ORAL
  Filled 2023-12-21 (×2): qty 2
  Filled 2023-12-21: qty 3

## 2023-12-21 MED ORDER — TRAZODONE HCL 50 MG PO TABS
50.0000 mg | ORAL_TABLET | Freq: Every evening | ORAL | Status: DC | PRN
  Filled 2023-12-21 (×2): qty 1

## 2023-12-21 MED ORDER — PROPOFOL 1000 MG/100ML IV EMUL
INTRAVENOUS | Status: AC
  Filled 2023-12-21: qty 100

## 2023-12-21 MED ORDER — HYDROMORPHONE HCL 1 MG/ML IJ SOLN
0.2500 mg | INTRAMUSCULAR | Status: DC | PRN
  Administered 2023-12-21: 0.25 mg via INTRAVENOUS

## 2023-12-21 MED ORDER — TRANEXAMIC ACID 650 MG PO TABS
1950.0000 mg | ORAL_TABLET | ORAL | Status: DC
  Filled 2023-12-21: qty 3

## 2023-12-21 MED ORDER — LEVOTHYROXINE SODIUM 50 MCG PO TABS
50.0000 ug | ORAL_TABLET | Freq: Every day | ORAL | Status: DC
  Administered 2023-12-22 – 2023-12-23 (×2): 50 ug via ORAL
  Filled 2023-12-21 (×2): qty 1

## 2023-12-21 MED ORDER — METHOCARBAMOL 500 MG PO TABS
500.0000 mg | ORAL_TABLET | Freq: Four times a day (QID) | ORAL | Status: DC
  Administered 2023-12-21 – 2023-12-23 (×7): 500 mg via ORAL
  Filled 2023-12-21 (×7): qty 1

## 2023-12-21 MED ORDER — METOPROLOL SUCCINATE ER 25 MG PO TB24: 25.0000 mg | ORAL_TABLET | Freq: Every day | ORAL | Status: DC

## 2023-12-21 MED ORDER — TAMSULOSIN HCL 0.4 MG PO CAPS
0.4000 mg | ORAL_CAPSULE | Freq: Every evening | ORAL | Status: DC
  Administered 2023-12-21 – 2023-12-22 (×2): 0.4 mg via ORAL
  Filled 2023-12-21 (×2): qty 1

## 2023-12-21 MED ORDER — ONDANSETRON HCL 4 MG/2ML IJ SOLN
INTRAMUSCULAR | Status: AC
  Filled 2023-12-21: qty 2

## 2023-12-21 MED ORDER — MENTHOL 3 MG MT LOZG: 1.0000 | LOZENGE | OROMUCOSAL | Status: DC | PRN

## 2023-12-21 MED ORDER — HYDROCODONE-ACETAMINOPHEN 10-325 MG PO TABS
1.0000 | ORAL_TABLET | ORAL | Status: DC | PRN
  Administered 2023-12-21 (×2): 1 via ORAL
  Filled 2023-12-21 (×2): qty 1

## 2023-12-21 MED ORDER — MORPHINE SULFATE (PF) 2 MG/ML IV SOLN
0.5000 mg | INTRAVENOUS | Status: DC | PRN
  Administered 2023-12-21: 1 mg via INTRAVENOUS
  Filled 2023-12-21: qty 1

## 2023-12-21 MED ORDER — VANCOMYCIN HCL 1000 MG IV SOLR
INTRAVENOUS | Status: AC
  Filled 2023-12-21: qty 20

## 2023-12-21 MED ORDER — CHLORHEXIDINE GLUCONATE 0.12 % MT SOLN
OROMUCOSAL | Status: AC
Start: 2023-12-21 — End: 2023-12-21
  Administered 2023-12-21: 15 mL via OROMUCOSAL
  Filled 2023-12-21: qty 15

## 2023-12-21 MED ORDER — LIDOCAINE 2% (20 MG/ML) 5 ML SYRINGE
INTRAMUSCULAR | Status: AC
  Filled 2023-12-21: qty 5

## 2023-12-21 MED ORDER — OXYCODONE HCL 5 MG PO TABS
5.0000 mg | ORAL_TABLET | Freq: Once | ORAL | Status: AC | PRN
  Administered 2023-12-21: 5 mg via ORAL

## 2023-12-21 MED ORDER — SENNA-DOCUSATE SODIUM 8.6-50 MG PO TABS: 2.0000 | ORAL_TABLET | Freq: Two times a day (BID) | ORAL | Status: DC

## 2023-12-21 MED ORDER — OXYCODONE HCL 5 MG PO TABS
ORAL_TABLET | ORAL | Status: AC
Start: 2023-12-21 — End: 2023-12-21
  Filled 2023-12-21: qty 1

## 2023-12-21 MED ORDER — ONDANSETRON HCL 4 MG PO TABS: 4.0000 mg | ORAL_TABLET | Freq: Four times a day (QID) | ORAL | Status: DC | PRN

## 2023-12-21 MED ORDER — PROPOFOL 10 MG/ML IV BOLUS
INTRAVENOUS | Status: DC | PRN
Start: 2023-12-21 — End: 2023-12-21
  Administered 2023-12-21: 25 mg via INTRAVENOUS

## 2023-12-21 MED ORDER — ALBUMIN HUMAN 5 % IV SOLN
12.5000 g | Freq: Once | INTRAVENOUS | Status: AC
  Administered 2023-12-21: 12.5 g via INTRAVENOUS

## 2023-12-21 MED ORDER — TRANEXAMIC ACID-NACL 1000-0.7 MG/100ML-% IV SOLN
INTRAVENOUS | Status: AC
  Filled 2023-12-21: qty 100

## 2023-12-21 MED ORDER — 0.9 % SODIUM CHLORIDE (POUR BTL) OPTIME
TOPICAL | Status: DC | PRN
  Administered 2023-12-21: 1000 mL

## 2023-12-21 MED ORDER — OXYCODONE HCL 5 MG/5ML PO SOLN: 5.0000 mg | Freq: Once | ORAL | Status: AC | PRN

## 2023-12-21 MED ORDER — FENTANYL CITRATE (PF) 250 MCG/5ML IJ SOLN
INTRAMUSCULAR | Status: AC
  Filled 2023-12-21: qty 5

## 2023-12-21 MED ORDER — METOCLOPRAMIDE HCL 5 MG PO TABS: 5.0000 mg | ORAL_TABLET | Freq: Three times a day (TID) | ORAL | Status: DC | PRN

## 2023-12-21 MED ORDER — ACETAMINOPHEN 10 MG/ML IV SOLN
INTRAVENOUS | Status: DC | PRN
  Administered 2023-12-21: 858 mg via INTRAVENOUS

## 2023-12-21 MED ORDER — LACTATED RINGERS IV SOLN: INTRAVENOUS | Status: DC

## 2023-12-21 MED ORDER — VANCOMYCIN HCL IN DEXTROSE 1-5 GM/200ML-% IV SOLN: 1000.0000 mg | INTRAVENOUS | Status: AC

## 2023-12-21 MED ORDER — KETOROLAC TROMETHAMINE 15 MG/ML IJ SOLN
15.0000 mg | Freq: Three times a day (TID) | INTRAMUSCULAR | Status: AC
  Administered 2023-12-21 – 2023-12-23 (×5): 15 mg via INTRAVENOUS
  Filled 2023-12-21 (×5): qty 1

## 2023-12-21 MED ORDER — VANCOMYCIN HCL IN DEXTROSE 1-5 GM/200ML-% IV SOLN
1000.0000 mg | Freq: Two times a day (BID) | INTRAVENOUS | Status: AC
  Administered 2023-12-21: 1000 mg via INTRAVENOUS
  Filled 2023-12-21: qty 200

## 2023-12-21 MED ORDER — PROPOFOL 10 MG/ML IV BOLUS
INTRAVENOUS | Status: AC
  Filled 2023-12-21: qty 20

## 2023-12-21 MED ORDER — DEXAMETHASONE SODIUM PHOSPHATE 10 MG/ML IJ SOLN
INTRAMUSCULAR | Status: DC | PRN
  Administered 2023-12-21: 5 mg via INTRAVENOUS

## 2023-12-21 MED ORDER — TRANEXAMIC ACID-NACL 1000-0.7 MG/100ML-% IV SOLN
1000.0000 mg | Freq: Once | INTRAVENOUS | Status: AC
  Administered 2023-12-21: 1000 mg via INTRAVENOUS
  Filled 2023-12-21: qty 100

## 2023-12-21 MED ORDER — ONDANSETRON HCL 4 MG/2ML IJ SOLN: 4.0000 mg | Freq: Once | INTRAMUSCULAR | Status: DC | PRN

## 2023-12-21 MED ORDER — TRANEXAMIC ACID-NACL 1000-0.7 MG/100ML-% IV SOLN
1000.0000 mg | INTRAVENOUS | Status: AC
  Administered 2023-12-21: 1000 mg via INTRAVENOUS

## 2023-12-21 MED ORDER — ALBUMIN HUMAN 5 % IV SOLN
INTRAVENOUS | Status: AC
Start: 2023-12-21 — End: 2023-12-21
  Filled 2023-12-21: qty 250

## 2023-12-21 SURGICAL SUPPLY — 44 items
BAG COUNTER SPONGE SURGICOUNT (BAG) ×1 IMPLANT
BIT DRILL 7/64X5 DISP (BIT) ×1 IMPLANT
BLADE SAW SGTL 18X1.27X75 (BLADE) ×1 IMPLANT
BNDG COHESIVE 6X5 TAN ST LF (GAUZE/BANDAGES/DRESSINGS) ×1 IMPLANT
CHLORAPREP W/TINT 26 (MISCELLANEOUS) ×1 IMPLANT
COVER PERINEAL POST (MISCELLANEOUS) ×1 IMPLANT
COVER SURGICAL LIGHT HANDLE (MISCELLANEOUS) ×1 IMPLANT
DERMABOND ADVANCED .7 DNX12 (GAUZE/BANDAGES/DRESSINGS) IMPLANT
DRAPE HALF SHEET 40X57 (DRAPES) ×2 IMPLANT
DRAPE IMP U-DRAPE 54X76 (DRAPES) ×2 IMPLANT
DRAPE INCISE IOBAN 85X60 (DRAPES) ×1 IMPLANT
DRAPE STERI IOBAN 125X83 (DRAPES) ×1 IMPLANT
DRAPE SURG 17X23 STRL (DRAPES) ×1 IMPLANT
DRAPE SURG ORHT 6 SPLT 77X108 (DRAPES) IMPLANT
DRAPE U-SHAPE 47X51 STRL (DRAPES) IMPLANT
DRSG AQUACEL AG ADV 3.5X10 (GAUZE/BANDAGES/DRESSINGS) IMPLANT
DRSG MEPILEX POST OP 4X8 (GAUZE/BANDAGES/DRESSINGS) ×1 IMPLANT
ELECT BLADE 6.5 EXT (BLADE) IMPLANT
ELECT CAUTERY BLADE 6.4 (BLADE) IMPLANT
ELECTRODE REM PT RTRN 9FT ADLT (ELECTROSURGICAL) ×1 IMPLANT
GLOVE BIO SURGEON STRL SZ 6.5 (GLOVE) ×3 IMPLANT
GLOVE BIO SURGEON STRL SZ7.5 (GLOVE) ×3 IMPLANT
GLOVE BIOGEL PI IND STRL 6.5 (GLOVE) ×1 IMPLANT
GLOVE BIOGEL PI IND STRL 7.5 (GLOVE) ×1 IMPLANT
GOWN STRL REUS W/ TWL LRG LVL3 (GOWN DISPOSABLE) ×2 IMPLANT
HEAD FEM -3XOFST 36XMDLR (Head) IMPLANT
KIT BASIN OR (CUSTOM PROCEDURE TRAY) ×1 IMPLANT
KIT TURNOVER KIT B (KITS) ×1 IMPLANT
LINER ACETAB VIT E +5 36 F (Liner) IMPLANT
MANIFOLD NEPTUNE II (INSTRUMENTS) ×1 IMPLANT
NS IRRIG 1000ML POUR BTL (IV SOLUTION) ×1 IMPLANT
PACK TOTAL JOINT (CUSTOM PROCEDURE TRAY) ×1 IMPLANT
PAD ARMBOARD POSITIONER FOAM (MISCELLANEOUS) ×2 IMPLANT
SHELL ACETAB 54 4H HIP (Shell) IMPLANT
STAPLER VISISTAT 35W (STAPLE) ×1 IMPLANT
STEM FEM CMTLS HIP 14X148 123D (Stem) IMPLANT
SUT ETHIBOND NAB CT1 #1 30IN (SUTURE) ×1 IMPLANT
SUT MNCRL AB 3-0 PS2 18 (SUTURE) IMPLANT
SUT MNCRL+ AB 3-0 CT1 36 (SUTURE) IMPLANT
SUT MON AB 2-0 CT1 36 (SUTURE) IMPLANT
SUT MON AB 2-0 SH 27 (SUTURE) ×1 IMPLANT
SUT VIC AB 1 CT1 27XBRD ANBCTR (SUTURE) ×1 IMPLANT
TOWEL GREEN STERILE (TOWEL DISPOSABLE) ×1 IMPLANT
WATER STERILE IRR 1000ML POUR (IV SOLUTION) ×1 IMPLANT

## 2023-12-21 NOTE — Interval H&P Note (Signed)
 History and Physical Interval Note:  12/21/2023 7:27 AM  Karen Allison  has presented today for surgery, with the diagnosis of POST TRAUMATIC ARTHRITIS RIGHT HIP.  The various methods of treatment have been discussed with the patient and family. After consideration of risks, benefits and other options for treatment, the patient has consented to  Procedure(s): CONVERSION, PREVIOUS HIP SURGERY, TO TOTAL HIP ARTHROPLASTY (Right) as a surgical intervention.  The patient's history has been reviewed, patient examined, no change in status, stable for surgery.  I have reviewed the patient's chart and labs.  Questions were answered to the patient's satisfaction.     Denise Bramblett P Ziyon Soltau

## 2023-12-21 NOTE — Op Note (Signed)
 Orthopaedic Surgery Operative Note (CSN: [id number] ) Date of Surgery: 12/21/2023  Admit Date: 12/21/2023   Diagnoses: Pre-Op Diagnoses: Posttraumatic arthritis right hip following right acetabular fracture  Post-Op Diagnosis: Same  Procedures: CPT 27132-Conversion right total hip arthroplasty  Surgeons : Primary: Laneta Pintos, MD  Assistant: 1. Albina Hull, MD 2. Alona Jamaica, PA-C  Location: OR 5   Anesthesia:Spinal   Antibiotics: Ancef  2g preop with 1 gm vancomycin  powder placed topically   Tourniquet time:None   Estimated Blood Loss: 150 mL  Complications:None  Specimens:* No specimens in log *   Implants: Implant Name Type Inv. Item Serial No. Manufacturer Lot No. LRB No. Used Action  SHELL ACETAB 54 4H HIP - [id number] Shell SHELL ACETAB 54 4H HIP  [employer](ORTH,TRAU,BIO,SG) [id number] Right 1 Implanted  LINER ACETAB VIT E +5 36 F - [id number] Liner LINER ACETAB VIT E +5 36 F  [employer](ORTH,TRAU,BIO,SG) [id number] Right 1 Implanted  HEAD FEM -3XOFST 36XMDLR - [id number] Head HEAD FEM -3XOFST 36XMDLR  [employer](ORTH,TRAU,BIO,SG) [id number] Right 1 Implanted  STEM FEM CMTLS HIP 14X148 123D - [id number] Stem STEM FEM CMTLS HIP 14X148 123D  [employer]ON(ORTH,TRAU,BIO,SG) [id number] Right 1 Implanted     Indications for Surgery: 78 year old female who sustained a right acetabular fracture that underwent open reduction internal fixation in November 2024.  She subsequently went on to develop end-stage posttraumatic arthritis.  Due to her severe pain and limited ambulation I recommended proceeding with total hip arthroplasty.  A CT scan was obtained to make sure the fracture was healed enough.  Risks and benefits were discussed with the patient.  Risks included but not limited to bleeding, infection, periprosthetic fracture, leg length discrepancy, hip dislocation, need for revision surgery, even the possibility anesthetic complications.  She agreed to proceed with  surgery and consent was obtained.  Operative Findings: 1.  Conversion of right hip to total hip arthroplasty using [employer] G7 acetabular shell size 54 2.  [employer] G7 acetabular vitamin E highly cross-linked polyethylene liner with +5 mm offset for 36 mm head 3.  [employer] Taperloc reduced distal size 14 mm high offset stem 4.  A 36 mm [employer] modular head cobalt chrome with a -3 neck.  Procedure: The patient was identified in the preoperative holding area. Consent was confirmed with the patient and their family and all questions were answered. The operative extremity was marked after confirmation with the patient. she was then brought back to the operating room by our anesthesia colleagues.  She was placed under spinal anesthetic and carefully transferred over to the [employer] [facility] table.  All bony prominences were well-padded.  Fluoroscopic imaging was obtained to show the leg length discrepancy as well as the significant degenerative changes of the right hip.  The right hip was then prepped and draped in usual sterile fashion.  A timeout was performed to verify the patient, the procedure, and the extremity.  Preoperative antibiotics were dosed.  A standard anterior approach to the hip was made and carried down through skin subcutaneous tissue.  Identified the TFL fascia and incised through this.  I then swept the fascia off of the TFL muscle and went into the interval between that and the sartorius.  I cauterized the ascending branches of the vessels.  I cleaned off the anterior neck to visualize the entirety of the capsule.  I then was able to make a capsulotomy and tried to retain as much capsule as possible and then I released it all the  way down until I was at the lesser trochanter.  I then performed a neck cut stain approximately 1 fingerbreadth above the lesser trochanter.  The femoral head was removed and sized appropriately.  Once the head was out of the acetabulum I then performed  excisional debridement of the periphery and the labrum.  I cleaned out all the soft tissues to have adequate exposure of the acetabulum for reaming.  Under fluoroscopic imaging I sequentially reamed from 45 mm up to 53 mm and obtained excellent fit with the 53 mm reamer.  There was a screw that was present but it was not causing any limitations in the fit of the acetabulum.  As result I felt that retention of the screw that was intra-articular would not be an issue and we chose a 54 mm shell.  This was press-fit into the acetabulum with excellent fixation.  Due to her hip and the previous surgery felt that a high offset liner was most appropriate and this was placed and positioned into the shell.  We then turned our attention to the femur.  The femur was then delivered through the incision and we performed a superior capsular release to expose the femur enough to allow for adequate broaching.  We then sequentially broached until we got good fit which was a size 13.  We trialed off of this with a standard offset neck with a -3 femoral head.  We did not have enough offset to be adequate.  We also felt that we could go up on our stem size due to the fit on the x-ray.  We then broached to a 14 mm and had excellent fit and rotational stability.  We then decided to use a high offset size 14 stem and the final implant was placed with good purchase and rotational stability.  We then proceeded to place a 36 mm cobalt chrome head with a -3 neck.  The hip was then reduced and stability was tested.  Fluoroscopic imaging showed adequate leg lengths compared to the contralateral side.  Final fluoroscopic imaging was obtained the incision was copiously irrigated.  A gram of vancomycin  powder was placed into the incision.  The capsule was closed with a #1 Ethibond suture.  The TFL fascia was closed with #1 Vicryl suture.  The skin was closed with 2-0 Monocryl and 3-0 Monocryl and Dermabond.  Sterile dressings were applied.   The patient was then awoke from anesthesia and taken to the PACU in stable condition.  Post Op Plan/Instructions: The patient will be weightbearing as tolerated to the right lower extremity.  She will have no range of motion restrictions.  We will have her mobilize with physical and Occupational Therapy.  We will restart Eliquis  on postoperative day 2.  I was present and performed the entire surgery.  Alona Jamaica, PA-C did assist me throughout the case. An assistant was necessary given the difficulty in approach, maintenance of reduction and ability to instrument the fracture.   Katheryne Pane, MD [facility]

## 2023-12-21 NOTE — Transfer of Care (Signed)
 Immediate Anesthesia Transfer of Care Note  Patient: Karen Allison  Procedure(s) Performed: TOTAL HIP ARTHROPLASTY (Right: Hip)  Patient Location: PACU  Anesthesia Type:Spinal, MAC  Level of Consciousness: awake, alert , and oriented  Airway & Oxygen  Therapy: Patient Spontanous Breathing  Post-op Assessment: Report given to RN and Post -op Vital signs reviewed and stable  Post vital signs: Reviewed and stable  Last Vitals:  Vitals Value Taken Time  BP 81/53 12/21/23 1030  Temp 36.7 C 12/21/23 1025  Pulse 75 12/21/23 1031  Resp 10 12/21/23 1031  SpO2 94 % 12/21/23 1031  Vitals shown include unfiled device data.  Last Pain:  Vitals:   12/21/23 1025  TempSrc:   PainSc: Asleep         Complications: No notable events documented.

## 2023-12-21 NOTE — Anesthesia Procedure Notes (Signed)
 Spinal  Patient location during procedure: OR Start time: 12/21/2023 7:38 AM End time: 12/21/2023 7:42 AM Reason for block: surgical anesthesia Staffing Performed: anesthesiologist  Anesthesiologist: Tura Gaines, MD Performed by: Tura Gaines, MD Authorized by: Tura Gaines, MD   Preanesthetic Checklist Completed: patient identified, IV checked, site marked, risks and benefits discussed, surgical consent, monitors and equipment checked, pre-op evaluation and timeout performed Spinal Block Patient position: sitting Prep: DuraPrep and site prepped and draped Patient monitoring: heart rate, cardiac monitor, continuous pulse ox and blood pressure Approach: midline Location: L3-4 Injection technique: single-shot Needle Needle type: Pencan  Needle gauge: 24 G Needle length: 9 cm Needle insertion depth: 6 cm Assessment Sensory level: T4 Events: CSF return Additional Notes Patient tolerated procedure well. Adequate sensory level.

## 2023-12-21 NOTE — Plan of Care (Signed)

## 2023-12-21 NOTE — Anesthesia Postprocedure Evaluation (Addendum)
 Anesthesia Post Note  Patient: Carly Roady  Procedure(s) Performed: TOTAL HIP ARTHROPLASTY (Right: Hip)     Patient location during evaluation: PACU Anesthesia Type: Spinal Level of consciousness: oriented and awake and alert Pain management: pain level controlled Vital Signs Assessment: post-procedure vital signs reviewed and stable Respiratory status: spontaneous breathing, respiratory function stable and nonlabored ventilation Cardiovascular status: blood pressure returned to baseline and stable Postop Assessment: no headache, no backache, no apparent nausea or vomiting, spinal receding and patient able to bend at knees Anesthetic complications: no   No notable events documented.  Last Vitals:  Vitals:   12/21/23 1055 12/21/23 1100  BP: (!) 83/50 (!) 88/48  Pulse: 77 75  Resp: 10 11  Temp:    SpO2: 99% 98%    Last Pain:  Vitals:   12/21/23 1025  TempSrc:   PainSc: Asleep                 Nusayba Cadenas A.

## 2023-12-22 ENCOUNTER — Ambulatory Visit: Admitting: Oncology

## 2023-12-22 ENCOUNTER — Ambulatory Visit

## 2023-12-22 ENCOUNTER — Encounter (HOSPITAL_COMMUNITY): Payer: Self-pay | Admitting: Student

## 2023-12-22 LAB — BASIC METABOLIC PANEL WITH GFR
Anion gap: 11 (ref 5–15)
BUN: 6 mg/dL — ABNORMAL LOW (ref 8–23)
CO2: 23 mmol/L (ref 22–32)
Calcium: 8.3 mg/dL — ABNORMAL LOW (ref 8.9–10.3)
Chloride: 94 mmol/L — ABNORMAL LOW (ref 98–111)
Creatinine, Ser: 0.66 mg/dL (ref 0.44–1.00)
GFR, Estimated: >60 mL/min (ref >60)
Glucose, Bld: 189 mg/dL — ABNORMAL HIGH (ref 70–99)
Potassium: 4.5 mmol/L (ref 3.5–5.1)
Sodium: 128 mmol/L — ABNORMAL LOW (ref 135–145)

## 2023-12-22 LAB — CBC
HCT: 33.5 % — ABNORMAL LOW (ref 36.0–46.0)
Hemoglobin: 11.1 g/dL — ABNORMAL LOW (ref 12.0–15.0)
MCH: 32.6 pg (ref 26.0–34.0)
MCHC: 33.1 g/dL (ref 30.0–36.0)
MCV: 98.5 fL (ref 80.0–100.0)
Platelets: 165 10*3/uL (ref 150–400)
RBC: 3.4 MIL/uL — ABNORMAL LOW (ref 3.87–5.11)
RDW: 12.9 % (ref 11.5–15.5)
WBC: 9.6 10*3/uL (ref 4.0–10.5)
nRBC: 0 % (ref 0.0–0.2)

## 2023-12-22 MED ORDER — SODIUM CHLORIDE 0.9 % IV SOLN: INTRAVENOUS | Status: DC

## 2023-12-22 MED ORDER — BISACODYL 10 MG RE SUPP: 10.0000 mg | Freq: Every day | RECTAL | Status: DC | PRN

## 2023-12-22 MED ORDER — DOCUSATE SODIUM 100 MG PO CAPS
200.0000 mg | ORAL_CAPSULE | Freq: Two times a day (BID) | ORAL | Status: DC
  Administered 2023-12-22 – 2023-12-23 (×2): 200 mg via ORAL
  Filled 2023-12-22 (×2): qty 2

## 2023-12-22 MED ORDER — OXYCODONE HCL 5 MG PO TABS
5.0000 mg | ORAL_TABLET | ORAL | Status: DC | PRN
  Administered 2023-12-22: 5 mg via ORAL
  Administered 2023-12-23 (×2): 10 mg via ORAL
  Filled 2023-12-22 (×2): qty 2
  Filled 2023-12-22: qty 1

## 2023-12-22 MED ORDER — DIAZEPAM 5 MG PO TABS: 5.0000 mg | ORAL_TABLET | Freq: Every evening | ORAL | Status: DC | PRN

## 2023-12-22 MED ORDER — SENNOSIDES-DOCUSATE SODIUM 8.6-50 MG PO TABS: 3.0000 | ORAL_TABLET | Freq: Every day | ORAL | Status: DC

## 2023-12-22 NOTE — Evaluation (Signed)
 Occupational Therapy Evaluation Patient Details Name: Karen Allison MRN: [id number] DOB: 1946/05/30 Today's Date: 12/22/2023   History of Present Illness   Patient is 78 y.o. female s/p Rt THA on 12/21/23 due to post traumatic arthritis after fall with Rt acetabular fx and repair 07/03/23. PMH significant for HTN, afib on Eliquis , anxiety, depression, endometrial cancer, stage IV triple positive breast cancer, arthritis, osteoporosis, ACDF C3-4, ORIF Lt humerus.     Clinical Impressions PTA patient independent with ADLs, using RW for mobility, spouse assisting with IADLs. Admitted for above and presents with problem list below.  She requires up to min assist for Adls, min guard for transfers and mobility in room.  Will have support of spouse at dc.  Anticipate she will progress well with no further OT needs after dc home. Will follow acutely.      If plan is discharge home, recommend the following:   A little help with walking and/or transfers;A little help with bathing/dressing/bathroom;Assistance with cooking/housework;Assist for transportation;Help with stairs or ramp for entrance     Functional Status Assessment         Equipment Recommendations   None recommended by OT     Recommendations for Other Services         Precautions/Restrictions   Precautions Precautions: Fall Recall of Precautions/Restrictions: Intact Restrictions Weight Bearing Restrictions Per Provider Order: No RLE Weight Bearing Per Provider Order: Weight bearing as tolerated     Mobility Bed Mobility               General bed mobility comments: OOB in recliner    Transfers Overall transfer level: Needs assistance Equipment used: Rolling walker (2 wheels) Transfers: Sit to/from Stand Sit to Stand: Contact guard assist           General transfer comment: cues for hand placement and CGA for safety with rise to RW      Balance Overall balance assessment: Needs  assistance, History of Falls Sitting-balance support: No upper extremity supported, Feet supported Sitting balance-Leahy Scale: Good     Standing balance support: Reliant on assistive device for balance, During functional activity, Bilateral upper extremity supported Standing balance-Leahy Scale: Poor Standing balance comment: relies on RW                           ADL either performed or assessed with clinical judgement   ADL Overall ADL's : Needs assistance/impaired     Grooming: Contact guard assist;Standing           Upper Body Dressing : Set up;Sitting   Lower Body Dressing: Minimal assistance;Sit to/from stand   Toilet Transfer: Contact guard assist;Ambulation;Rolling walker (2 wheels)           Functional mobility during ADLs: Minimal assistance;Rolling walker (2 wheels);Cueing for sequencing;Cueing for safety       Vision         Perception         Praxis         Pertinent Vitals/Pain Pain Assessment Pain Assessment: Faces Faces Pain Scale: Hurts a little bit Pain Location: Rt hip Pain Descriptors / Indicators: Aching, Dull, Discomfort Pain Intervention(s): Limited activity within patient's tolerance, Monitored during session, Repositioned     Extremity/Trunk Assessment Upper Extremity Assessment Upper Extremity Assessment: Generalized weakness;LUE deficits/detail LUE Deficits / Details: hx of fx wtih limited shoulder flexion to 45* LUE Coordination: decreased gross motor   Lower Extremity Assessment Lower Extremity Assessment: Defer to  PT evaluation       Communication Communication Communication: No apparent difficulties   Cognition Arousal: Alert Behavior During Therapy: WFL for tasks assessed/performed Cognition: No apparent impairments                               Following commands: Intact       Cueing  General Comments   Cueing Techniques: Verbal cues      Exercises     Shoulder Instructions       Home Living                                          Prior Functioning/Environment                      OT Problem List:     OT Treatment/Interventions:        OT Goals(Current goals can be found in the care plan section)       OT Frequency:  Min 2X/week    Co-evaluation              AM-PAC OT "6 Clicks" Daily Activity     Outcome Measure Help from another person eating meals?: None Help from another person taking care of personal grooming?: A Little Help from another person toileting, which includes using toliet, bedpan, or urinal?: A Little Help from another person bathing (including washing, rinsing, drying)?: A Little Help from another person to put on and taking off regular upper body clothing?: A Little Help from another person to put on and taking off regular lower body clothing?: A Little 6 Click Score: 19   End of Session Equipment Utilized During Treatment: Rolling walker (2 wheels) Nurse Communication: Mobility status  Activity Tolerance: Patient tolerated treatment well Patient left: in chair;with call bell/phone within reach;with chair alarm set  OT Visit Diagnosis: Unsteadiness on feet (R26.81);Pain;Muscle weakness (generalized) (M62.81) Pain - Right/Left: Right Pain - part of body: Hip                Time: 1028-1100 OT Time Calculation (min): 32 min Charges:  OT General Charges $OT Visit: 1 Visit OT Evaluation $OT Eval Moderate Complexity: 1 Mod OT Treatments $Self Care/Home Management : 8-22 mins  Karen Allison, OT Acute Rehabilitation Services Office [phone] Secure Chat Preferred    Karen Allison 12/22/2023, 2:20 PM

## 2023-12-22 NOTE — Progress Notes (Addendum)
 Patient would not like her foley removed at this time she would like to wait a few hours.

## 2023-12-22 NOTE — Evaluation (Signed)
 Physical Therapy Evaluation Patient Details Name: Karen Allison MRN: [id number] DOB: [birth year] Today's Date: 12/22/2023  History of Present Illness  Patient is 78 y.o. female s/p Rt THA on 12/21/23 due to post traumatic arthritis after fall with Rt acetabular fx and repair 07/03/23. PMH significant for HTN, afib on Eliquis , anxiety, depression, endometrial cancer, stage IV triple positive breast cancer, arthritis, osteoporosis, ACDF C3-4, ORIF Lt humerus.   Clinical Impression  Karen Allison is a 78 y.o. female POD 1 s/p Rt THA anterior approach. Patient reports mod ind with RW/rollator for mobility at baseline. Patient is now limited by functional impairments (see PT problem list below) and requires CGA for transfers and gait with RW. Patient was able to ambulate ~130 feet with RW and CGA. Pt encouraged to perform ankle pumps for circulation while OOB in recliner and while at rest. Patient will benefit from continued skilled PT interventions to address impairments and progress towards PLOF. Acute PT will follow to progress mobility and stair training in preparation for safe discharge home.         If plan is discharge home, recommend the following: A little help with walking and/or transfers;A little help with bathing/dressing/bathroom;Assistance with cooking/housework;Assist for transportation;Help with stairs or ramp for entrance   Can travel by private vehicle        Equipment Recommendations None recommended by PT  Recommendations for Other Services       Functional Status Assessment Patient has had a recent decline in their functional status and demonstrates the ability to make significant improvements in function in a reasonable and predictable amount of time.     Precautions / Restrictions Precautions Precautions: Fall Recall of Precautions/Restrictions: Intact Restrictions Weight Bearing Restrictions Per Provider Order: No RLE Weight Bearing Per Provider Order:  Weight bearing as tolerated      Mobility  Bed Mobility Overal bed mobility: Needs Assistance Bed Mobility: Supine to Sit     Supine to sit: Contact guard, Supervision     General bed mobility comments: extra time and use of bed rail    Transfers Overall transfer level: Needs assistance Equipment used: Rolling walker (2 wheels) Transfers: Sit to/from Stand Sit to Stand: Contact guard assist           General transfer comment: cues for hand placement and CGA for safety with rise to RW    Ambulation/Gait Ambulation/Gait assistance: Contact guard assist, Supervision Gait Distance (Feet): 130 Feet Assistive device: Rolling walker (2 wheels) Gait Pattern/deviations: Step-through pattern, Decreased stride length, Decreased weight shift to right, Decreased stance time - right Gait velocity: decr     General Gait Details: excellent awareness of walker management and maintained safe proximity and step pattern. no overt LOB noted.  Stairs            Wheelchair Mobility     Tilt Bed    Modified Rankin (Stroke Patients Only)       Balance Overall balance assessment: Needs assistance, History of Falls Sitting-balance support: No upper extremity supported, Feet supported       Standing balance support: Reliant on assistive device for balance, During functional activity, Bilateral upper extremity supported Standing balance-Leahy Scale: Poor                               Pertinent Vitals/Pain Pain Assessment Pain Assessment: Faces Faces Pain Scale: Hurts a little bit Pain Location: Rt hip Pain Descriptors / Indicators: Aching,  Dull, Discomfort Pain Intervention(s): Limited activity within patient's tolerance, Monitored during session, Repositioned    Home Living Family/patient expects to be discharged to:: Private residence Living Arrangements: Spouse/significant other;Children;Other relatives Available Help at Discharge: Family;Available 24  hours/day (granddaughter is across the street may be gone to school this fall) Type of Home: House Home Access: Stairs to enter;Ramped entrance Entrance Stairs-Rails: Left Entrance Stairs-Number of Steps: 2 Alternate Level Stairs-Number of Steps: stair lift Home Layout: Two level;Able to live on main level with bedroom/bathroom Home Equipment: Tub bench;Shower seat;BSC/3in1;Rollator (4 wheels);Cane - single point;Adaptive equipment Additional Comments: Has chair lift to go upstairs, has long shower chair in roll in shower, rollator, BSC, custom w/c, transport chair    Prior Function Prior Level of Function : Independent/Modified Independent;Driving             Mobility Comments: RW in home since last ADLs Comments: ind ADLs, spouse assists with IADLs     Extremity/Trunk Assessment   Upper Extremity Assessment Upper Extremity Assessment: Defer to OT evaluation;Generalized weakness    Lower Extremity Assessment Lower Extremity Assessment: Generalized weakness    Cervical / Trunk Assessment Cervical / Trunk Assessment: Normal  Communication   Communication Communication: No apparent difficulties    Cognition Arousal: Alert Behavior During Therapy: WFL for tasks assessed/performed   PT - Cognitive impairments: No apparent impairments                         Following commands: Intact       Cueing Cueing Techniques: Verbal cues     General Comments      Exercises     Assessment/Plan    PT Assessment Patient needs continued PT services  PT Problem List Decreased strength;Decreased coordination;Decreased mobility;Decreased balance;Decreased activity tolerance;Decreased range of motion;Decreased knowledge of use of DME;Decreased knowledge of precautions;Pain       PT Treatment Interventions DME instruction;Gait training;Stair training;Functional mobility training;Therapeutic activities;Therapeutic exercise;Balance training;Patient/family  education;Neuromuscular re-education    PT Goals (Current goals can be found in the Care Plan section)  Acute Rehab PT Goals Patient Stated Goal: recover mobility and stop hurting at hip and knee PT Goal Formulation: With patient Time For Goal Achievement: 01/05/24 Potential to Achieve Goals: Good    Frequency 7X/week     Co-evaluation               AM-PAC PT "6 Clicks" Mobility  Outcome Measure Help needed turning from your back to your side while in a flat bed without using bedrails?: A Little Help needed moving from lying on your back to sitting on the side of a flat bed without using bedrails?: A Little Help needed moving to and from a bed to a chair (including a wheelchair)?: A Little Help needed standing up from a chair using your arms (e.g., wheelchair or bedside chair)?: A Little Help needed to walk in hospital room?: A Little Help needed climbing 3-5 steps with a railing? : A Little 6 Click Score: 18    End of Session Equipment Utilized During Treatment: Gait belt Activity Tolerance: Patient tolerated treatment well Patient left: with call bell/phone within reach;with chair alarm set;in chair Nurse Communication: Mobility status PT Visit Diagnosis: Other abnormalities of gait and mobility (R26.89);Muscle weakness (generalized) (M62.81);Difficulty in walking, not elsewhere classified (R26.2);Other symptoms and signs involving the nervous system (R29.898)    Time: 1610-9604 PT Time Calculation (min) (ACUTE ONLY): 38 min   Charges:   PT Evaluation $PT Eval Low  Complexity: 1 Low PT Treatments $Gait Training: 8-22 mins $Therapeutic Activity: 8-22 mins PT General Charges $$ ACUTE PT VISIT: 1 Visit         Tish Forge, DPT Acute Rehabilitation Services Office [phone]  12/22/23 10:37 AM

## 2023-12-22 NOTE — Progress Notes (Signed)
 Orthopaedic Trauma Progress Note  SUBJECTIVE: Patient doing well this morning.  Had a lot of pain overnight, pain medication was increased. Doing better this morning.  Has not been up out of bed yet since surgery but is about to work with therapy.  Is eager to see how she does because prior to surgery she was having significant pain in the right hip with limited mobility.  No chest pain. No SOB. No nausea/vomiting. No other complaints.  Tolerating diet and fluids.  1345: Doing well this afternoon. Grand-daughter at bedside. Therapy went really well, was able to ambulate 130 ft. BP has been a little low since surgery. Will hold her Metoprolol . Patient noted to have chronic hyponatremia (128 this AM). Will restart IVF and add salt tab. No BM since admission, will adjust stool softener and laxative to her home doses. No other issues of note this afternoon  OBJECTIVE:  Vitals:   12/22/23 0254 12/22/23 0835  BP: (!) 106/58 106/64  Pulse: 79 93  Resp: 16 20  Temp: 97.8 F (36.6 C) 97.9 F (36.6 C)  SpO2: 96% 99%    Opiates Today (MME): Today's  total administered Morphine  Milligram Equivalents: 15 Opiates Yesterday (MME): Yesterday's total administered Morphine  Milligram Equivalents: 88  General: Sitting up on EOB Respiratory: No increased work of breathing.  Operative Extremity (RLE): Dressing CDI. Swelling and bruising noted over the lateral hip and thigh. Ankle DF/PF intact. Endorses sensation throughout extremity. +DP pulse  IMAGING: Stable post op imaging.   LABS: No results found for this or any previous visit (from the past 24 hours).  ASSESSMENT: Karen Allison is a 78 y.o. female, 1 Day Post-Op s/p right TOTAL HIP ARTHROPLASTY  CV/Blood loss: ABLA, Hgb 11.1 this AM. BP soft  PLAN: Weightbearing: WBAT RLE ROM: Unrestricted ROM.  No hip precautions needed Incisional and dressing care: Dressings left intact until follow-up  Showering: Okay to shower and get Aquacel  dressing wet Orthopedic device(s): None  Pain management:  1. Tylenol  650 mg q 6 hours scheduled 2. Robaxin  500 mg 4 times daily  3. Oxycodone  5-10 mg q 4 hours PRN 4. Morphine  0.5-1 mg q 2 hours PRN 5. Toradol  15 mg every 8 hours 6. Neurontin  300 mg 3 times daily and 1200 mg nightly VTE prophylaxis: SCDs.  Restart home dose Eliquis  on POD #2 ID: Vancomycin  post op Foley/Lines: Remove Foley today.  KVO IVFs Dispo: PT/OT evaluation today.  Pending pain control and mobility with therapies, anticipate discharge home over the next 24 to 48 hours.    D/C recommendations: - Oxycodone , Robaxin , Tylenol  for pain control - Home dose Eliquis  for DVT prophylaxis - Continue home dose Vit D supplementation  Follow - up plan: 2 weeks after d/c for wound check and repeat x-rays   Contact information:  Katheryne Pane MD, Alona Jamaica PA-C. After hours and holidays please check [email or web address] for group call information for [facility]   [name], PA-C [phone] (office) [email or web address]

## 2023-12-22 NOTE — Plan of Care (Signed)

## 2023-12-22 NOTE — Discharge Instructions (Signed)
 [facility] Discharge Instructions   General Discharge Instructions  WEIGHT BEARING STATUS: Weightbearing as tolerated  RANGE OF MOTION/ACTIVITY: Unrestricted range of motion of the hip and knee  Wound Care: Leave dressing in place over the right hip until follow-up appointment.  If water becomes trapped under dressing or dressing becomes soiled, please remove dressing and clean incision gently with soap and water. If dressing is removed, replace with clean/dry bandage   DVT/PE prophylaxis:  Eliquis   Diet: as you were eating previously.  Can use over the counter stool softeners and bowel preparations, such as Miralax , to help with bowel movements.  Narcotics can be constipating.  Be sure to drink plenty of fluids  PAIN MEDICATION USE AND EXPECTATIONS  You have likely been given narcotic medications to help control your pain.  After a traumatic event that results in an fracture (broken bone) with or without surgery, it is ok to use narcotic pain medications to help control one's pain.  We understand that everyone responds to pain differently and each individual patient will be evaluated on a regular basis for the continued need for narcotic medications. Ideally, narcotic medication use should last no more than 6-8 weeks (coinciding with fracture healing).   As a patient it is your responsibility as well to monitor narcotic medication use and report the amount and frequency you use these medications when you come to your office visit.   We would also advise that if you are using narcotic medications, you should take a dose prior to therapy to maximize you participation.  IF YOU ARE ON NARCOTIC MEDICATIONS IT IS NOT PERMISSIBLE TO OPERATE A MOTOR VEHICLE (MOTORCYCLE/CAR/TRUCK/MOPED) OR HEAVY MACHINERY DO NOT MIX NARCOTICS WITH OTHER CNS (CENTRAL NERVOUS SYSTEM) DEPRESSANTS SUCH AS ALCOHOL   POST-OPERATIVE OPIOID TAPER INSTRUCTIONS: It is important to wean off of your opioid  medication as soon as possible. If you do not need pain medication after your surgery it is ok to stop day one. Opioids include: Codeine, Hydrocodone (Norco, Vicodin), Oxycodone (Percocet, oxycontin ) and hydromorphone  amongst others.  Long term and even short term use of opiods can cause: Increased pain response Dependence Constipation Depression Respiratory depression And more.  Withdrawal symptoms can include Flu like symptoms Nausea, vomiting And more Techniques to manage these symptoms Hydrate well Eat regular healthy meals Stay active Use relaxation techniques(deep breathing, meditating, yoga) Do Not substitute Alcohol  to help with tapering If you have been on opioids for less than two weeks and do not have pain than it is ok to stop all together.  Plan to wean off of opioids This plan should start within one week post op of your fracture surgery  Maintain the same interval or time between taking each dose and first decrease the dose.  Cut the total daily intake of opioids by one tablet each day Next start to increase the time between doses. The last dose that should be eliminated is the evening dose.    STOP SMOKING OR USING NICOTINE PRODUCTS!!!!  As discussed nicotine severely impairs your body's ability to heal surgical and traumatic wounds but also impairs bone healing.  Wounds and bone heal by forming microscopic blood vessels (angiogenesis) and nicotine is a vasoconstrictor (essentially, shrinks blood vessels).  Therefore, if vasoconstriction occurs to these microscopic blood vessels they essentially disappear and are unable to deliver necessary nutrients to the healing tissue.  This is one modifiable factor that you can do to dramatically increase your chances of healing your injury.  (This means no smoking,  no nicotine gum, patches, etc)  DO NOT USE NONSTEROIDAL ANTI-INFLAMMATORY DRUGS (NSAID'S)  Using products such as Advil  (ibuprofen ), Aleve (naproxen), Motrin   (ibuprofen ) for additional pain control during fracture healing can delay and/or prevent the healing response.  If you would like to take over the counter (OTC) medication, Tylenol  (acetaminophen ) is ok.  However, some narcotic medications that are given for pain control contain acetaminophen  as well. Therefore, you should not exceed more than 4000 mg of tylenol  in a day if you do not have liver disease.  Also note that there are may OTC medicines, such as cold medicines and allergy medicines that my contain tylenol  as well.  If you have any questions about medications and/or interactions please ask your doctor/PA or your pharmacist.      ICE AND ELEVATE INJURED/OPERATIVE EXTREMITY  Using ice and elevating the injured extremity above your heart can help with swelling and pain control.  Icing in a pulsatile fashion, such as 20 minutes on and 20 minutes off, can be followed.    Do not place ice directly on skin. Make sure there is a barrier between to skin and the ice pack.    Using frozen items such as frozen peas works well as the conform nicely to the are that needs to be iced.  USE AN ACE WRAP OR TED HOSE FOR SWELLING CONTROL  In addition to icing and elevation, Ace wraps or TED hose are used to help limit and resolve swelling.  It is recommended to use Ace wraps or TED hose until you are informed to stop.    When using Ace Wraps start the wrapping distally (farthest away from the body) and wrap proximally (closer to the body)   Example: If you had surgery on your leg or thing and you do not have a splint on, start the ace wrap at the toes and work your way up to the thigh        If you had surgery on your upper extremity and do not have a splint on, start the ace wrap at your fingers and work your way up to the upper arm   CALL THE OFFICE FOR MEDICATION REFILLS OR WITH ANY QUESTIONS/CONCERNS: [phone]   VISIT OUR WEBSITE FOR ADDITIONAL INFORMATION: [email or web address]   Discharge Wound Care  Instructions  Do NOT apply any ointments, solutions or lotions to pin sites or surgical wounds.  These prevent needed drainage and even though solutions like hydrogen peroxide kill bacteria, they also damage cells lining the pin sites that help fight infection.  Applying lotions or ointments can keep the wounds moist and can cause them to breakdown and open up as well. This can increase the risk for infection. When in doubt call the office.  Surgical incisions should be dressed daily.  Once the incision is completely dry and without drainage, it may be left open to air out.  Showering may begin 36-48 hours later.  Cleaning gently with soap and water.   Call office for the following: Temperature greater than 101F Persistent nausea and vomiting Severe uncontrolled pain Redness, tenderness, or signs of infection (pain, swelling, redness, odor or green/yellow discharge around the site) Difficulty breathing, headache or visual disturbances Hives Persistent dizziness or light-headedness Extreme fatigue Any other questions or concerns you may have after discharge  In an emergency, call 911 or go to an Emergency Department at a nearby hospital  OTHER HELPFUL INFORMATION  If you had a block, it will wear off between 8-24 hrs  postop typically.  This is period when your pain may go from nearly zero to the pain you would have had postop without the block.  This is an abrupt transition but nothing dangerous is happening.  You may take an extra dose of narcotic when this happens.  You should wean off your narcotic medicines as soon as you are able.  Most patients will be off or using minimal narcotics before their first postop appointment.   We suggest you use the pain medication the first night prior to going to bed, in order to ease any pain when the anesthesia wears off. You should avoid taking pain medications on an empty stomach as it will make you nauseous.  Do not drink alcoholic beverages or  take illicit drugs when taking pain medications.  In most states it is against the law to drive while you are in a splint or sling.  And certainly against the law to drive while taking narcotics.  You may return to work/school in the next couple of days when you feel up to it.   Pain medication may make you constipated.  Below are a few solutions to try in this order: Decrease the amount of pain medication if you aren't having pain. Drink lots of decaffeinated fluids. Drink prune juice and/or each dried prunes  If the first 3 don't work start with additional solutions Take Colace - an over-the-counter stool softener Take Senokot - an over-the-counter laxative Take Miralax  - a stronger over-the-counter laxative

## 2023-12-23 LAB — CBC
HCT: 32.1 % — ABNORMAL LOW (ref 36.0–46.0)
Hemoglobin: 10.7 g/dL — ABNORMAL LOW (ref 12.0–15.0)
MCH: 32.6 pg (ref 26.0–34.0)
MCHC: 33.3 g/dL (ref 30.0–36.0)
MCV: 97.9 fL (ref 80.0–100.0)
Platelets: 143 10*3/uL — ABNORMAL LOW (ref 150–400)
RBC: 3.28 MIL/uL — ABNORMAL LOW (ref 3.87–5.11)
RDW: 12.8 % (ref 11.5–15.5)
WBC: 8.8 10*3/uL (ref 4.0–10.5)
nRBC: 0 % (ref 0.0–0.2)

## 2023-12-23 LAB — BASIC METABOLIC PANEL WITH GFR
Anion gap: 6 (ref 5–15)
BUN: 6 mg/dL — ABNORMAL LOW (ref 8–23)
CO2: 26 mmol/L (ref 22–32)
Calcium: 7.8 mg/dL — ABNORMAL LOW (ref 8.9–10.3)
Chloride: 96 mmol/L — ABNORMAL LOW (ref 98–111)
Creatinine, Ser: 0.59 mg/dL (ref 0.44–1.00)
GFR, Estimated: >60 mL/min (ref >60)
Glucose, Bld: 152 mg/dL — ABNORMAL HIGH (ref 70–99)
Potassium: 4.2 mmol/L (ref 3.5–5.1)
Sodium: 128 mmol/L — ABNORMAL LOW (ref 135–145)

## 2023-12-23 MED ORDER — OXYCODONE HCL 5 MG PO TABS: 5.0000 mg | ORAL_TABLET | ORAL | 0 refills | Status: DC | PRN

## 2023-12-23 NOTE — TOC Transition Note (Signed)
 Transition of Care [name]) - Discharge Note   Patient Details  Name: Karen Allison MRN: [id number] Date of Birth: [birth year]  [facility] CM/SW Contact:  Jannine Meo, RN Phone Number: 12/23/2023, 9:55 AM   Clinical Narrative:   Patient is being discharged today. HH PT order noted and referral sent to Amy with [name]. Patient used [name] in Dec 2024 and shows still current with them. Amy with [name] confirmed acceptance for [name] PT. Contact information on AVS.    Final next level of care: Home w Home Health Services     Patient Goals and CMS Choice            Discharge Placement                       Discharge Plan and Services Additional resources added to the After Visit Summary for                            [name] Arranged: PT HH Agency: [name] Date [facility] Agency Contacted: 12/23/23 Time HH Agency Contacted: [phone] (accepted @ [phone]) Representative spoke with at [facility] Agency: Amy  Social Drivers of Health (SDOH) Interventions SDOH Screenings   Food Insecurity: No Food Insecurity (12/21/2023)  Housing: Low Risk  (12/21/2023)  Transportation Needs: No Transportation Needs (12/21/2023)  Utilities: Not At Risk (12/21/2023)  Social Connections: Moderately Isolated (12/21/2023)  Tobacco Use: Medium Risk (12/21/2023)     Readmission Risk Interventions     No data to display

## 2023-12-23 NOTE — Progress Notes (Signed)
 Physical Therapy Treatment Patient Details Name: Karen Allison MRN: [id number] DOB: 1946-08-20 Today's Date: 12/23/2023   History of Present Illness Patient is 78 y.o. 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 Other PMH includes HTN, afib on Eliquis , anxiety, depression, endometrial cancer, stage IV triple positive breast cancer, arthritis, osteoporosis, ACDF C3-4, ORIF Lt humerus.   PT Comments  Pt progressing well with mobility. Today's session focused on transfer and gait training with RW; pt performing mobility and self-care tasks at supervision-level. Pt preparing for d/c home today; reviewed education, pt reports no further questions or concerns. Continue to recommend HHPT services to maximize functional mobility and independence upon return home. If to remain admitted, will continue to follow acutely to address established goals.     If plan is discharge home, recommend the following: A little help with bathing/dressing/bathroom;Assistance with cooking/housework;Assist for transportation;Help with stairs or ramp for entrance   Can travel by private vehicle      Yes  Equipment Recommendations  None recommended by PT    Recommendations for Other Services  Mobility Specialist     Precautions / Restrictions Precautions Precautions: Fall Recall of Precautions/Restrictions: Intact Restrictions RLE Weight Bearing Per Provider Order: Weight bearing as tolerated Other Position/Activity Restrictions: "unrestricted ROM"     Mobility  Bed Mobility Overal bed mobility: Modified Independent Bed Mobility: Supine to Sit           General bed mobility comments: increased time and effort secondary to pain, use of bed rail    Transfers Overall transfer level: Needs assistance Equipment used: Rolling walker (2 wheels) Transfers: Sit to/from Stand Sit to Stand: Supervision           General  transfer comment: multiple sit<>stands from EOB, BSC (over toilet) and recliner to RW, supervision for safety; good awareness of hand placement and sequencing    Ambulation/Gait Ambulation/Gait assistance: Supervision Gait Distance (Feet): 40 Feet Assistive device: Rolling walker (2 wheels) Gait Pattern/deviations: Step-through pattern, Decreased stride length, Decreased weight shift to right, Decreased stance time - right, Antalgic Gait velocity: Decreased     General Gait Details: slow, antalgic gait with RW and supervision for safety; good awareness of safe RW manage and sequencing; distance limited by c/o R hip soreness ("I think I sat in the chair too long yesterday")   Stairs             Wheelchair Mobility     Tilt Bed    Modified Rankin (Stroke Patients Only)       Balance Overall balance assessment: Needs assistance, History of Falls Sitting-balance support: No upper extremity supported, Feet supported Sitting balance-Leahy Scale: Good Sitting balance - Comments: able to don shoes sitting EOB, indep with pericare on toilet   Standing balance support: During functional activity, No upper extremity supported, Single extremity supported Standing balance-Leahy Scale: Fair Standing balance comment: can static stand without UE support to pull down/up underwear; reliant on RW to walk                            Communication Communication Communication: No apparent difficulties  Cognition Arousal: Alert Behavior During Therapy: WFL for tasks assessed/performed   PT - Cognitive impairments: No apparent impairments                         Following commands: Intact  Cueing    Exercises General Exercises - Lower Extremity Ankle Circles/Pumps: AROM, Both Quad Sets: AROM, Both Straight Leg Raises: AROM, Right (partial range)    General Comments General comments (skin integrity, edema, etc.): pt preparing for d/c home today. reviewed  educ re: POC, precautions, positioning, activity recommendations, therex/AROM, d/c needs      Pertinent Vitals/Pain Pain Assessment Pain Assessment: Faces Faces Pain Scale: Hurts little more Pain Location: Rt hip Pain Descriptors / Indicators: Dull, Discomfort, Sore Pain Intervention(s): Monitored during session, Patient requesting pain meds-RN notified, Limited activity within patient's tolerance    Home Living                          Prior Function            PT Goals (current goals can now be found in the care plan section) Progress towards PT goals: Progressing toward goals    Frequency    7X/week      PT Plan      Co-evaluation              AM-PAC PT "6 Clicks" Mobility   Outcome Measure  Help needed turning from your back to your side while in a flat bed without using bedrails?: None Help needed moving from lying on your back to sitting on the side of a flat bed without using bedrails?: A Little Help needed moving to and from a bed to a chair (including a wheelchair)?: A Little Help needed standing up from a chair using your arms (e.g., wheelchair or bedside chair)?: A Little Help needed to walk in hospital room?: A Little Help needed climbing 3-5 steps with a railing? : A Little 6 Click Score: 19    End of Session   Activity Tolerance: Patient tolerated treatment well;Patient limited by pain Patient left: in chair;with call bell/phone within reach;with chair alarm set Nurse Communication: Mobility status;Patient requests pain meds PT Visit Diagnosis: Other abnormalities of gait and mobility (R26.89);Muscle weakness (generalized) (M62.81);Difficulty in walking, not elsewhere classified (R26.2);Other symptoms and signs involving the nervous system (R29.898)     Time: 9147-8295 PT Time Calculation (min) (ACUTE ONLY): 24 min  Charges:    $Therapeutic Activity: 23-37 mins PT General Charges $$ ACUTE PT VISIT: 1 Visit                      Blase Bur, PT, DPT Acute Rehabilitation Services  Personal: Secure Chat Rehab Office: [phone]  Albino Hum 12/23/2023, 11:20 AM

## 2023-12-23 NOTE — Discharge Summary (Signed)
 Orthopaedic Trauma Service (OTS) Discharge Summary   Patient ID: Karen Allison MRN: [id number] DOB/AGE: [birth year] 80 y.o.  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 Fib   Endometrial cancer (HCC) 01/21/1988   Fx lower humerus-closed 10/22/1997   Gait disorder 07/05/2016   Graves' disease 07/16/2021   HCAP (healthcare-associated pneumonia) 05/09/2014   Hepatitis     Hep A years ago   History of corticosteroid therapy 02/13/2023   History of endocrine disorder 02/13/2023   HNP (herniated nucleus pulposus), cervical 05/06/2014   Hypertension    Hyponatremia 12/02/2021   Hypothyroidism    Graves disease, age 62 yo   Malignant neoplasm of left breast (HCC) 06/05/2020   Nose trouble    right nostril will hemorrhage at times   Osteoporosis 11/04/2020   Other forms of scoliosis, thoracolumbar region 11/03/2016   Pneumonia    Rib pain on left side 09/26/2022   Rosacea    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  Taken to the operating room by Dr. Curtiss Dowdy for the above procedure.  She tolerated this well without complications.  Was admitted to the orthopedic service postoperatively for pain control and therapies.  Began working with physical and Occupational Therapy on postoperative day #1.  Patient's home dose Eliquis  was held for 48 hours postoperatively but was restarted on postoperative day #2. On 12/23/2023, the patient was tolerating diet, working well with therapies, pain well controlled, vital signs stable, dressings clean, dry, intact and felt stable for discharge to home. Patient will follow up as below and knows to call with questions or concerns.     Consults: None  Significant Diagnostic Studies:   Results for orders placed or performed during the hospital encounter of 12/21/23 (from the past week)  CBC   Collection Time: 12/22/23  8:48 AM  Result Value Ref Range   WBC 9.6 4.0 - 10.5 K/uL   RBC 3.40 (L) 3.87 - 5.11 MIL/uL   Hemoglobin 11.1 (L) 12.0 - 15.0 g/dL   HCT 82.9 (L) 56.2 - 13.0 %   MCV 98.5 80.0 - 100.0 fL   MCH 32.6 26.0 - 34.0 pg   MCHC 33.1 30.0 - 36.0 g/dL   RDW 86.5 78.4 - 69.6 %   Platelets 165 150 - 400 K/uL   nRBC 0.0 0.0 - 0.2 %  Basic metabolic panel   Collection Time: 12/22/23  8:48 AM  Result Value Ref Range   Sodium 128 (L) 135 - 145 mmol/L   Potassium 4.5 3.5 - 5.1 mmol/L   Chloride 94 (L) 98 - 111 mmol/L   CO2 23 22 - 32 mmol/L   Glucose, Bld 189 (H) 70 - 99 mg/dL   BUN 6 (L) 8 - 23 mg/dL   Creatinine, Ser 1.61 0.44 - 1.00 mg/dL   Calcium  8.3 (L) 8.9 - 10.3 mg/dL   GFR, Estimated >09 >60 mL/min   Anion gap 11 5 - 15  CBC   Collection Time: 12/23/23  6:46 AM  Result Value Ref Range   WBC 8.8 4.0 - 10.5 K/uL   RBC 3.28 (L) 3.87 - 5.11 MIL/uL   Hemoglobin 10.7 (L) 12.0 - 15.0 g/dL   HCT 45.4 (L) 09.8 - 11.9 %   MCV 97.9 80.0 - 100.0 fL   MCH 32.6 26.0 - 34.0 pg   MCHC 33.3 30.0 - 36.0 g/dL   RDW 14.7 82.9 - 56.2 %    Platelets 143 (L) 150 - 400 K/uL   nRBC 0.0 0.0 - 0.2 %  Basic metabolic panel   Collection Time: 12/23/23  6:46 AM  Result Value Ref Range   Sodium 128 (L) 135 - 145 mmol/L   Potassium 4.2 3.5 - 5.1 mmol/L   Chloride 96 (L) 98 - 111 mmol/L   CO2 26 22 - 32 mmol/L   Glucose, Bld 152 (H) 70 - 99 mg/dL   BUN 6 (L) 8 - 23 mg/dL   Creatinine, Ser 1.30 0.44 - 1.00 mg/dL   Calcium  7.8 (L) 8.9 - 10.3 mg/dL   GFR, Estimated >86 >57 mL/min   Anion gap 6 5 - 15     Treatments: IV hydration, antibiotics: Ancef , analgesia: acetaminophen , Vicodin, Morphine , and Toradol , cardiac meds: metoprolol , anticoagulation: Eliquis , therapies: PT and OT, and surgery: As above  Discharge Exam: General: Sitting up in bed eating breakfast.  Respiratory: No increased work of breathing.  Operative Extremity (RLE): Dressing CDI. Swelling and bruising noted over the lateral hip and thigh. Ankle DF/PF intact. Endorses sensation throughout extremity. +DP pulse  Disposition: Discharge disposition: 06-Home-Health Care Svc       Discharge Instructions     Call MD / Call 911   Complete by: As directed    If you experience chest pain or shortness of breath, CALL 911 and be transported to the hospital emergency room.  If you develope a fever above 101 F, pus (white drainage) or increased drainage or redness at the wound, or calf pain, call your surgeon's office.   Constipation Prevention   Complete by: As directed    Drink plenty of fluids.  Prune juice may be helpful.  You may use a stool softener, such as Colace (over the counter) 100 mg twice a day.  Use MiraLax  (over the counter) for constipation as needed.   Diet - low sodium heart healthy   Complete by: As directed    Increase activity slowly as tolerated   Complete by: As directed    Post-operative opioid taper instructions:   Complete by: As directed    POST-OPERATIVE OPIOID TAPER INSTRUCTIONS: It is important to wean off of your opioid medication  as soon as possible. If you do not need pain medication after your surgery it is ok to stop day one. Opioids include: Codeine, Hydrocodone (Norco, Vicodin), Oxycodone (Percocet, oxycontin ) and hydromorphone  amongst others.  Long term and even short term use of opiods can cause: Increased  pain response Dependence Constipation Depression Respiratory depression And more.  Withdrawal symptoms can include Flu like symptoms Nausea, vomiting And more Techniques to manage these symptoms Hydrate well Eat regular healthy meals Stay active Use relaxation techniques(deep breathing, meditating, yoga) Do Not substitute Alcohol  to help with tapering If you have been on opioids for less than two weeks and do not have pain than it is ok to stop all together.  Plan to wean off of opioids This plan should start within one week post op of your joint replacement. Maintain the same interval or time between taking each dose and first decrease the dose.  Cut the total daily intake of opioids by one tablet each day Next start to increase the time between doses. The last dose that should be eliminated is the evening dose.         Allergies as of 12/23/2023       Reactions   Cefuroxime Hives   Zithromax [azithromycin] Hives        Medication List     STOP taking these medications    HYDROcodone -acetaminophen  10-325 MG tablet Commonly known as: NORCO       TAKE these medications    acetaminophen  325 MG tablet Commonly known as: TYLENOL  Take 2 tablets (650 mg total) by mouth 3 (three) times daily as needed for mild pain (pain score 1-3).   apixaban  5 MG Tabs tablet Commonly known as: ELIQUIS  Take 1/2 tablet twice daily   B COMPLEX PO Take 1 tablet by mouth daily.   CALTRATE 600+D PO Take 1 tablet by mouth 2 (two) times daily.   denosumab  60 MG/ML Sosy injection Commonly known as: PROLIA  Inject 60 mg into the skin every 6 (six) months.   diazepam  10 MG tablet Commonly known as:  VALIUM  Take 1 tablet (10 mg total) by mouth at bedtime as needed. What changed:  how much to take reasons to take this   diclofenac  Sodium 1 % Gel Commonly known as: VOLTAREN  Apply 1 Application topically 4 (four) times daily as needed (pain).   docusate sodium  100 MG capsule Commonly known as: COLACE Take 2 capsules (200 mg total) by mouth daily after supper. What changed: when to take this   doxycycline  50 MG capsule Commonly known as: VIBRAMYCIN  Take 50 mg by mouth daily as needed (rosacea).   estradiol  0.1 MG/GM vaginal cream Commonly known as: ESTRACE  Place 1 Applicatorful vaginally 2 (two) times a week.   fluconazole 150 MG tablet Commonly known as: DIFLUCAN Take 150 mg by mouth daily as needed (irritation).   furosemide  20 MG tablet Commonly known as: LASIX  Take 20 mg by mouth as needed for edema or fluid.   gabapentin  600 MG tablet Commonly known as: NEURONTIN  Take 600-1,200 mg by mouth See admin instructions. 300 mg three times daily, 1200 mg at bedtime   Herceptin  440 MG injection Generic drug: trastuzumab  440 mg by Intravenous (Continuous Infusion) route every 21 ( twenty-one) days. infusion every 3 weeks   levothyroxine  50 MCG tablet Commonly known as: SYNTHROID  Take 1 tablet (50 mcg total) by mouth daily.   methocarbamol  500 MG tablet Commonly known as: ROBAXIN  Take 1 tablet (500 mg total) by mouth 4 (four) times daily.   metoprolol  succinate 25 MG 24 hr tablet Commonly known as: TOPROL -XL Take 1 tablet (25 mg total) by mouth daily.   multivitamin with minerals Tabs tablet Take 1 tablet by mouth daily.   omeprazole  20 MG capsule Commonly known as: PRILOSEC Take 1  capsule (20 mg total) by mouth daily. What changed:  when to take this reasons to take this   ondansetron  8 MG tablet Commonly known as: ZOFRAN  Take 8 mg by mouth every 8 (eight) hours as needed for nausea or vomiting.   oxyCODONE  5 MG immediate release tablet Commonly known as:  Oxy IR/ROXICODONE  Take 1-2 tablets (5-10 mg total) by mouth every 4 (four) hours as needed for severe pain (pain score 7-10) or moderate pain (pain score 4-6).   Potassium Chloride  ER 20 MEQ Tbcr Take 20 mEq by mouth daily. Only takes when taking her fluid pill   rosuvastatin  10 MG tablet Commonly known as: CRESTOR  Take 10 mg by mouth once a week. Fridays   sennosides-docusate sodium  8.6-50 MG tablet Commonly known as: SENOKOT-S Take 2 tablets by mouth 2 (two) times daily. What changed:  how much to take when to take this   tamsulosin  0.4 MG Caps capsule Commonly known as: FLOMAX  Take 1 capsule (0.4 mg total) by mouth every evening.   traZODone  50 MG tablet Commonly known as: DESYREL  Take 50 mg by mouth at bedtime as needed for sleep.   vitamin D3 25 MCG tablet Commonly known as: CHOLECALCIFEROL  Take 1 tablet (1,000 Units total) by mouth daily.        Follow-up Information     Haddix, Florentina Huntsman, MD. Schedule an appointment as soon as possible for a visit in 2 week(s).   Specialty: Orthopedic Surgery Why: for wound check and repeat x-rays Contact information: [address] [place] [address] [phone]                 Discharge Instructions and Plan: Patient will be discharged to home. Will be discharged on  Eliquis   for DVT prophylaxis. Patient has been provided with all the necessary DME for discharge. Patient will follow up with Dr. Curtiss Dowdy in 2 weeks for repeat x-rays and suture removal.   Signed:  [name], PA-C ?[phone]? (phone) 12/23/2023, 9:01 AM  [facility] [address] [place] [address] [phone] Cathlean Co (F)

## 2023-12-23 NOTE — Progress Notes (Signed)
 Karen Allison to be D/C'd per MD order. Discussed with the patient and all questions fully answered. ? VSS, Skin clean, dry and intact without evidence of skin break down, no evidence of skin tears noted. ? IV catheter discontinued intact. Site without signs and symptoms of complications. Dressing and pressure applied. ? An After Visit Summary was printed and given to the patient. Patient informed where to pickup prescriptions. ? D/c education completed with patient/family including follow up instructions, medication list, d/c activities limitations if indicated, with other d/c instructions as indicated by MD - patient able to verbalize understanding, all questions fully answered.  ? Patient instructed to return to ED, call 911, or call MD for any changes in condition.  ? Patient to be escorted via WC, and D/C home via private auto.

## 2023-12-25 DIAGNOSIS — I4892 Unspecified atrial flutter: Secondary | ICD-10-CM | POA: Diagnosis not present

## 2023-12-25 DIAGNOSIS — M80051D Age-related osteoporosis with current pathological fracture, right femur, subsequent encounter for fracture with routine healing: Secondary | ICD-10-CM | POA: Diagnosis not present

## 2023-12-25 DIAGNOSIS — C50919 Malignant neoplasm of unspecified site of unspecified female breast: Secondary | ICD-10-CM | POA: Diagnosis not present

## 2023-12-25 DIAGNOSIS — C787 Secondary malignant neoplasm of liver and intrahepatic bile duct: Secondary | ICD-10-CM | POA: Diagnosis not present

## 2023-12-25 DIAGNOSIS — I4891 Unspecified atrial fibrillation: Secondary | ICD-10-CM | POA: Diagnosis not present

## 2023-12-25 DIAGNOSIS — C7800 Secondary malignant neoplasm of unspecified lung: Secondary | ICD-10-CM | POA: Diagnosis not present

## 2023-12-27 ENCOUNTER — Ambulatory Visit (HOSPITAL_BASED_OUTPATIENT_CLINIC_OR_DEPARTMENT_OTHER)
Admission: RE | Admit: 2023-12-27 | Discharge: 2023-12-27 | Disposition: A | Discharge status: Home / Self Care | Source: Ambulatory Visit | Attending: Student | Admitting: Student

## 2023-12-27 ENCOUNTER — Other Ambulatory Visit (HOSPITAL_BASED_OUTPATIENT_CLINIC_OR_DEPARTMENT_OTHER): Payer: Self-pay | Admitting: Student

## 2023-12-27 DIAGNOSIS — R2241 Localized swelling, mass and lump, right lower limb: Secondary | ICD-10-CM

## 2023-12-27 DIAGNOSIS — R600 Localized edema: Secondary | ICD-10-CM | POA: Diagnosis not present

## 2023-12-27 DIAGNOSIS — Z96641 Presence of right artificial hip joint: Secondary | ICD-10-CM | POA: Diagnosis not present

## 2023-12-27 DIAGNOSIS — M79604 Pain in right leg: Secondary | ICD-10-CM | POA: Diagnosis not present

## 2023-12-29 DIAGNOSIS — C787 Secondary malignant neoplasm of liver and intrahepatic bile duct: Secondary | ICD-10-CM | POA: Diagnosis not present

## 2023-12-29 DIAGNOSIS — M80051D Age-related osteoporosis with current pathological fracture, right femur, subsequent encounter for fracture with routine healing: Secondary | ICD-10-CM | POA: Diagnosis not present

## 2023-12-29 DIAGNOSIS — C50919 Malignant neoplasm of unspecified site of unspecified female breast: Secondary | ICD-10-CM | POA: Diagnosis not present

## 2023-12-29 DIAGNOSIS — I4892 Unspecified atrial flutter: Secondary | ICD-10-CM | POA: Diagnosis not present

## 2023-12-29 DIAGNOSIS — C7800 Secondary malignant neoplasm of unspecified lung: Secondary | ICD-10-CM | POA: Diagnosis not present

## 2023-12-29 DIAGNOSIS — I4891 Unspecified atrial fibrillation: Secondary | ICD-10-CM | POA: Diagnosis not present

## 2024-01-02 DIAGNOSIS — S32401D Unspecified fracture of right acetabulum, subsequent encounter for fracture with routine healing: Secondary | ICD-10-CM | POA: Diagnosis not present

## 2024-01-02 DIAGNOSIS — M1611 Unilateral primary osteoarthritis, right hip: Secondary | ICD-10-CM | POA: Diagnosis not present

## 2024-01-03 DIAGNOSIS — C7800 Secondary malignant neoplasm of unspecified lung: Secondary | ICD-10-CM | POA: Diagnosis not present

## 2024-01-03 DIAGNOSIS — C787 Secondary malignant neoplasm of liver and intrahepatic bile duct: Secondary | ICD-10-CM | POA: Diagnosis not present

## 2024-01-03 DIAGNOSIS — I4892 Unspecified atrial flutter: Secondary | ICD-10-CM | POA: Diagnosis not present

## 2024-01-03 DIAGNOSIS — C50919 Malignant neoplasm of unspecified site of unspecified female breast: Secondary | ICD-10-CM | POA: Diagnosis not present

## 2024-01-03 DIAGNOSIS — M80051D Age-related osteoporosis with current pathological fracture, right femur, subsequent encounter for fracture with routine healing: Secondary | ICD-10-CM | POA: Diagnosis not present

## 2024-01-03 DIAGNOSIS — I4891 Unspecified atrial fibrillation: Secondary | ICD-10-CM | POA: Diagnosis not present

## 2024-01-05 DIAGNOSIS — C50919 Malignant neoplasm of unspecified site of unspecified female breast: Secondary | ICD-10-CM | POA: Diagnosis not present

## 2024-01-05 DIAGNOSIS — M80051D Age-related osteoporosis with current pathological fracture, right femur, subsequent encounter for fracture with routine healing: Secondary | ICD-10-CM | POA: Diagnosis not present

## 2024-01-05 DIAGNOSIS — C787 Secondary malignant neoplasm of liver and intrahepatic bile duct: Secondary | ICD-10-CM | POA: Diagnosis not present

## 2024-01-05 DIAGNOSIS — C7800 Secondary malignant neoplasm of unspecified lung: Secondary | ICD-10-CM | POA: Diagnosis not present

## 2024-01-05 DIAGNOSIS — I4891 Unspecified atrial fibrillation: Secondary | ICD-10-CM | POA: Diagnosis not present

## 2024-01-05 DIAGNOSIS — I4892 Unspecified atrial flutter: Secondary | ICD-10-CM | POA: Diagnosis not present

## 2024-01-08 DIAGNOSIS — C50919 Malignant neoplasm of unspecified site of unspecified female breast: Secondary | ICD-10-CM | POA: Diagnosis not present

## 2024-01-08 DIAGNOSIS — C787 Secondary malignant neoplasm of liver and intrahepatic bile duct: Secondary | ICD-10-CM | POA: Diagnosis not present

## 2024-01-08 DIAGNOSIS — I4891 Unspecified atrial fibrillation: Secondary | ICD-10-CM | POA: Diagnosis not present

## 2024-01-08 DIAGNOSIS — M80051D Age-related osteoporosis with current pathological fracture, right femur, subsequent encounter for fracture with routine healing: Secondary | ICD-10-CM | POA: Diagnosis not present

## 2024-01-08 DIAGNOSIS — I4892 Unspecified atrial flutter: Secondary | ICD-10-CM | POA: Diagnosis not present

## 2024-01-08 DIAGNOSIS — C7800 Secondary malignant neoplasm of unspecified lung: Secondary | ICD-10-CM | POA: Diagnosis not present

## 2024-01-09 ENCOUNTER — Other Ambulatory Visit (HOSPITAL_BASED_OUTPATIENT_CLINIC_OR_DEPARTMENT_OTHER): Payer: Self-pay

## 2024-01-10 DIAGNOSIS — M80051D Age-related osteoporosis with current pathological fracture, right femur, subsequent encounter for fracture with routine healing: Secondary | ICD-10-CM | POA: Diagnosis not present

## 2024-01-10 DIAGNOSIS — C50919 Malignant neoplasm of unspecified site of unspecified female breast: Secondary | ICD-10-CM | POA: Diagnosis not present

## 2024-01-10 DIAGNOSIS — I4892 Unspecified atrial flutter: Secondary | ICD-10-CM | POA: Diagnosis not present

## 2024-01-10 DIAGNOSIS — C787 Secondary malignant neoplasm of liver and intrahepatic bile duct: Secondary | ICD-10-CM | POA: Diagnosis not present

## 2024-01-10 DIAGNOSIS — I4891 Unspecified atrial fibrillation: Secondary | ICD-10-CM | POA: Diagnosis not present

## 2024-01-10 DIAGNOSIS — C7800 Secondary malignant neoplasm of unspecified lung: Secondary | ICD-10-CM | POA: Diagnosis not present

## 2024-01-11 ENCOUNTER — Encounter: Payer: Self-pay | Admitting: Oncology

## 2024-01-12 ENCOUNTER — Telehealth: Payer: Self-pay

## 2024-01-12 ENCOUNTER — Inpatient Hospital Stay: Attending: Oncology

## 2024-01-12 ENCOUNTER — Other Ambulatory Visit (HOSPITAL_BASED_OUTPATIENT_CLINIC_OR_DEPARTMENT_OTHER): Payer: Self-pay

## 2024-01-12 ENCOUNTER — Encounter: Payer: Self-pay | Admitting: Oncology

## 2024-01-12 VITALS — BP 151/83 | HR 88 | Temp 97.7°F | Resp 18 | Ht 61.0 in | Wt 137.8 lb

## 2024-01-12 DIAGNOSIS — Z79899 Other long term (current) drug therapy: Secondary | ICD-10-CM | POA: Insufficient documentation

## 2024-01-12 DIAGNOSIS — Z79620 Long term (current) use of immunosuppressive biologic: Secondary | ICD-10-CM | POA: Diagnosis not present

## 2024-01-12 DIAGNOSIS — C50912 Malignant neoplasm of unspecified site of left female breast: Secondary | ICD-10-CM | POA: Diagnosis not present

## 2024-01-12 DIAGNOSIS — Z5112 Encounter for antineoplastic immunotherapy: Secondary | ICD-10-CM | POA: Diagnosis not present

## 2024-01-12 DIAGNOSIS — Z170 Estrogen receptor positive status [ER+]: Secondary | ICD-10-CM | POA: Diagnosis not present

## 2024-01-12 MED ORDER — SODIUM CHLORIDE 0.9 % IV SOLN: Freq: Once | INTRAVENOUS | Status: AC

## 2024-01-12 MED ORDER — TRASTUZUMAB-ANNS CHEMO 150 MG IV SOLR
6.0000 mg/kg | Freq: Once | INTRAVENOUS | Status: AC
  Administered 2024-01-12: 378 mg via INTRAVENOUS
  Filled 2024-01-12: qty 18

## 2024-01-12 NOTE — Progress Notes (Signed)
 Do not reload despite delay in trastuzumab  per Dr. Almer Jacobson.

## 2024-01-12 NOTE — Patient Instructions (Signed)
 Ado-Trastuzumab  Emtansine Injection What is this medication? ADO-TRASTUZUMAB  EMTANSINE (ADD oh traz TOO zuh mab em TAN zine) treats breast cancer. It works by blocking a protein that causes cancer cells to grow and multiply. This helps to slow or stop the spread of cancer cells. This medicine may be used for other purposes; ask your health care provider or pharmacist if you have questions. COMMON BRAND NAME(S): Kadcyla What should I tell my care team before I take this medication? They need to know if you have any of these conditions: Heart failure Liver disease Low platelet levels Lung disease Tingling of the fingers or toes or other nerve disorder An unusual or allergic reaction to ado-trastuzumab  emtansine, other medications, foods, dyes, or preservatives Pregnant or trying to get pregnant Breast-feeding How should I use this medication? This medication is infused into a vein. It is given by your care team in a hospital or clinic setting. Talk to your care team about the use of this medication in children. Special care may be needed. Overdosage: If you think you have taken too much of this medicine contact a poison control center or emergency room at once. NOTE: This medicine is only for you. Do not share this medicine with others. What if I miss a dose? Keep appointments for follow-up doses. It is important not to miss your dose. Call your care team if you are unable to keep an appointment. What may interact with this medication? Atazanavir Boceprevir Clarithromycin Dalfopristin; quinupristin Delavirdine Indinavir Isoniazid, INH Itraconazole Ketoconazole Nefazodone Nelfinavir Ritonavir Telaprevir Telithromycin Tipranavir Voriconazole This list may not describe all possible interactions. Give your health care provider a list of all the medicines, herbs, non-prescription drugs, or dietary supplements you use. Also tell them if you smoke, drink alcohol , or use illegal drugs.  Some items may interact with your medicine. What should I watch for while using this medication? This medication may make you feel generally unwell. This is not uncommon, as chemotherapy can affect healthy cells as well as cancer cells. Report any side effects. Continue your course of treatment even though you feel ill unless your care team tells you to stop. You may need blood work while taking this medication. This medication may increase your risk to bruise or bleed. Call your care team if you notice any unusual bleeding. Be careful brushing or flossing your teeth or using a toothpick because you may get an infection or bleed more easily. If you have any dental work done, tell your dentist you are receiving this medication. Talk to your care team if you may be pregnant. Serious birth defects can occur if you take this medication during pregnancy and for 7 months after the last dose. You will need a negative pregnancy test before starting this medication. Contraception is recommended while taking this medication and for 7 months after the last dose. Your care team can help you find the option that works for you. If your partner can get pregnant, use a condom during sex while taking this medication and for 4 months after the last dose. Do not breastfeed while taking this medication and for 7 months after the last dose. This medication may cause infertility. Talk to your care team if you are concerned with your fertility. What side effects may I notice from receiving this medication? Side effects that you should report to your care team as soon as possible: Allergic reactions--skin rash, itching, hives, swelling of the face, lips, tongue, or throat Bleeding--bloody or black, tar-like stools, vomiting  blood or brown material that looks like coffee grounds, red or dark brown urine, small red or purple spots on skin, unusual bruising or bleeding Dry cough, shortness of breath or trouble breathing Heart  failure--shortness of breath, swelling of the ankles, feet, or hands, sudden weight gain, unusual weakness or fatigue Infusion reactions--chest pain, shortness of breath or trouble breathing, feeling faint or lightheaded Liver injury--right upper belly pain, loss of appetite, nausea, light-colored stool, dark yellow or brown urine, yellowing skin or eyes, unusual weakness or fatigue Pain, tingling, or numbness in the hands or feet Painful swelling, warmth, or redness of the skin, blisters or sores at the infusion site Side effects that usually do not require medical attention (report to your care team if they continue or are bothersome): Constipation Fatigue Headache Muscle pain Nausea This list may not describe all possible side effects. Call your doctor for medical advice about side effects. You may report side effects to FDA at 1-800-FDA-1088. Where should I keep my medication? This medication is given in a hospital or clinic. It will not be stored at home. NOTE: This sheet is a summary. It may not cover all possible information. If you have questions about this medicine, talk to your doctor, pharmacist, or health care provider.  2024 Elsevier/Gold Standard (2021-12-24 00:00:00) [facility] - A DEPT OF [facility]  Discharge Instructions: Thank you for choosing  Cancer Center to provide your oncology and hematology care.  If you have a lab appointment with the Cancer Center, please go directly to the Cancer Center and check in at the registration area.   Wear comfortable clothing and clothing appropriate for easy access to any Portacath or PICC line.   We strive to give you quality time with your provider. You may need to reschedule your appointment if you arrive late (15 or more minutes).  Arriving late affects you and other patients whose appointments are after yours.  Also, if you miss three or more appointments without notifying the office, you  may be dismissed from the clinic at the provider's discretion.      For prescription refill requests, have your pharmacy contact our office and allow 72 hours for refills to be completed.    Today you received the following chemotherapy and/or immunotherapy agents TRASTUZUMAB       To help prevent nausea and vomiting after your treatment, we encourage you to take your nausea medication as directed.  BELOW ARE SYMPTOMS THAT SHOULD BE REPORTED IMMEDIATELY: *FEVER GREATER THAN 100.4 F (38 C) OR HIGHER *CHILLS OR SWEATING *NAUSEA AND VOMITING THAT IS NOT CONTROLLED WITH YOUR NAUSEA MEDICATION *UNUSUAL SHORTNESS OF BREATH *UNUSUAL BRUISING OR BLEEDING *URINARY PROBLEMS (pain or burning when urinating, or frequent urination) *BOWEL PROBLEMS (unusual diarrhea, constipation, pain near the anus) TENDERNESS IN MOUTH AND THROAT WITH OR WITHOUT PRESENCE OF ULCERS (sore throat, sores in mouth, or a toothache) UNUSUAL RASH, SWELLING OR PAIN  UNUSUAL VAGINAL DISCHARGE OR ITCHING   Items with * indicate a potential emergency and should be followed up as soon as possible or go to the Emergency Department if any problems should occur.  Please show the CHEMOTHERAPY ALERT CARD or IMMUNOTHERAPY ALERT CARD at check-in to the Emergency Department and triage nurse.  Should you have questions after your visit or need to cancel or reschedule your appointment, please contact [facility] [place] - A DEPT OF [facility]  Dept: [phone]  and follow the prompts.  Office hours are 8:00 a.m. to 4:30 p.m. Monday - Friday. Please note that voicemails left after 4:00 p.m. may not be returned until the following business day.  We are closed weekends and major holidays. You have access to a nurse at all times for urgent questions. Please call the main number to the clinic Dept: [phone] and follow the prompts.  For any non-urgent questions, you may also contact your provider using MyChart. We now  offer e-Visits for anyone 7 and older to request care online for non-urgent symptoms. For details visit [email or web address].   Also download the MyChart app! Go to the app store, search "MyChart", open the app, select [facility], and log in with your MyChart username and password.

## 2024-01-12 NOTE — Progress Notes (Signed)
 SEE NOTES FROM 1413 AND 1600.

## 2024-01-12 NOTE — Telephone Encounter (Signed)
 Karen Allison called and left message she would be here today at 2pm for infusion and , Dr Almer Jacobson wanted to check her , secure chats sent to [facility] and Dr Almer Jacobson.(CT knot on deltoid)

## 2024-01-17 DIAGNOSIS — I4891 Unspecified atrial fibrillation: Secondary | ICD-10-CM | POA: Diagnosis not present

## 2024-01-17 DIAGNOSIS — I4892 Unspecified atrial flutter: Secondary | ICD-10-CM | POA: Diagnosis not present

## 2024-01-17 DIAGNOSIS — C787 Secondary malignant neoplasm of liver and intrahepatic bile duct: Secondary | ICD-10-CM | POA: Diagnosis not present

## 2024-01-17 DIAGNOSIS — C7800 Secondary malignant neoplasm of unspecified lung: Secondary | ICD-10-CM | POA: Diagnosis not present

## 2024-01-17 DIAGNOSIS — C50919 Malignant neoplasm of unspecified site of unspecified female breast: Secondary | ICD-10-CM | POA: Diagnosis not present

## 2024-01-17 DIAGNOSIS — M80051D Age-related osteoporosis with current pathological fracture, right femur, subsequent encounter for fracture with routine healing: Secondary | ICD-10-CM | POA: Diagnosis not present

## 2024-01-18 ENCOUNTER — Encounter: Payer: Self-pay | Admitting: Cardiology

## 2024-01-19 DIAGNOSIS — C50919 Malignant neoplasm of unspecified site of unspecified female breast: Secondary | ICD-10-CM | POA: Diagnosis not present

## 2024-01-19 DIAGNOSIS — M80051D Age-related osteoporosis with current pathological fracture, right femur, subsequent encounter for fracture with routine healing: Secondary | ICD-10-CM | POA: Diagnosis not present

## 2024-01-19 DIAGNOSIS — C7800 Secondary malignant neoplasm of unspecified lung: Secondary | ICD-10-CM | POA: Diagnosis not present

## 2024-01-19 DIAGNOSIS — I4891 Unspecified atrial fibrillation: Secondary | ICD-10-CM | POA: Diagnosis not present

## 2024-01-19 DIAGNOSIS — C787 Secondary malignant neoplasm of liver and intrahepatic bile duct: Secondary | ICD-10-CM | POA: Diagnosis not present

## 2024-01-19 DIAGNOSIS — I4892 Unspecified atrial flutter: Secondary | ICD-10-CM | POA: Diagnosis not present

## 2024-01-20 DIAGNOSIS — G4700 Insomnia, unspecified: Secondary | ICD-10-CM | POA: Diagnosis not present

## 2024-01-20 DIAGNOSIS — F32A Depression, unspecified: Secondary | ICD-10-CM | POA: Diagnosis not present

## 2024-01-20 DIAGNOSIS — G8929 Other chronic pain: Secondary | ICD-10-CM | POA: Diagnosis not present

## 2024-01-20 DIAGNOSIS — D509 Iron deficiency anemia, unspecified: Secondary | ICD-10-CM | POA: Diagnosis not present

## 2024-01-20 DIAGNOSIS — G4730 Sleep apnea, unspecified: Secondary | ICD-10-CM | POA: Diagnosis not present

## 2024-01-20 DIAGNOSIS — M47812 Spondylosis without myelopathy or radiculopathy, cervical region: Secondary | ICD-10-CM | POA: Diagnosis not present

## 2024-01-20 DIAGNOSIS — Z7901 Long term (current) use of anticoagulants: Secondary | ICD-10-CM | POA: Diagnosis not present

## 2024-01-20 DIAGNOSIS — M80051D Age-related osteoporosis with current pathological fracture, right femur, subsequent encounter for fracture with routine healing: Secondary | ICD-10-CM | POA: Diagnosis not present

## 2024-01-20 DIAGNOSIS — E785 Hyperlipidemia, unspecified: Secondary | ICD-10-CM | POA: Diagnosis not present

## 2024-01-20 DIAGNOSIS — I4892 Unspecified atrial flutter: Secondary | ICD-10-CM | POA: Diagnosis not present

## 2024-01-20 DIAGNOSIS — M4802 Spinal stenosis, cervical region: Secondary | ICD-10-CM | POA: Diagnosis not present

## 2024-01-20 DIAGNOSIS — F419 Anxiety disorder, unspecified: Secondary | ICD-10-CM | POA: Diagnosis not present

## 2024-01-20 DIAGNOSIS — I4891 Unspecified atrial fibrillation: Secondary | ICD-10-CM | POA: Diagnosis not present

## 2024-01-20 DIAGNOSIS — J45909 Unspecified asthma, uncomplicated: Secondary | ICD-10-CM | POA: Diagnosis not present

## 2024-01-20 DIAGNOSIS — E0500 Thyrotoxicosis with diffuse goiter without thyrotoxic crisis or storm: Secondary | ICD-10-CM | POA: Diagnosis not present

## 2024-01-20 DIAGNOSIS — M4135 Thoracogenic scoliosis, thoracolumbar region: Secondary | ICD-10-CM | POA: Diagnosis not present

## 2024-01-20 DIAGNOSIS — Z96641 Presence of right artificial hip joint: Secondary | ICD-10-CM | POA: Diagnosis not present

## 2024-01-20 DIAGNOSIS — E039 Hypothyroidism, unspecified: Secondary | ICD-10-CM | POA: Diagnosis not present

## 2024-01-20 DIAGNOSIS — C787 Secondary malignant neoplasm of liver and intrahepatic bile duct: Secondary | ICD-10-CM | POA: Diagnosis not present

## 2024-01-20 DIAGNOSIS — C50919 Malignant neoplasm of unspecified site of unspecified female breast: Secondary | ICD-10-CM | POA: Diagnosis not present

## 2024-01-20 DIAGNOSIS — K219 Gastro-esophageal reflux disease without esophagitis: Secondary | ICD-10-CM | POA: Diagnosis not present

## 2024-01-20 DIAGNOSIS — I10 Essential (primary) hypertension: Secondary | ICD-10-CM | POA: Diagnosis not present

## 2024-01-20 DIAGNOSIS — C7800 Secondary malignant neoplasm of unspecified lung: Secondary | ICD-10-CM | POA: Diagnosis not present

## 2024-01-20 DIAGNOSIS — K5909 Other constipation: Secondary | ICD-10-CM | POA: Diagnosis not present

## 2024-01-24 ENCOUNTER — Other Ambulatory Visit: Payer: Self-pay | Admitting: Oncology

## 2024-01-24 DIAGNOSIS — Z1231 Encounter for screening mammogram for malignant neoplasm of breast: Secondary | ICD-10-CM

## 2024-01-24 DIAGNOSIS — M80051D Age-related osteoporosis with current pathological fracture, right femur, subsequent encounter for fracture with routine healing: Secondary | ICD-10-CM | POA: Diagnosis not present

## 2024-01-24 DIAGNOSIS — E039 Hypothyroidism, unspecified: Secondary | ICD-10-CM | POA: Diagnosis not present

## 2024-01-24 DIAGNOSIS — Z6824 Body mass index (BMI) 24.0-24.9, adult: Secondary | ICD-10-CM | POA: Diagnosis not present

## 2024-01-24 DIAGNOSIS — C787 Secondary malignant neoplasm of liver and intrahepatic bile duct: Secondary | ICD-10-CM | POA: Diagnosis not present

## 2024-01-24 DIAGNOSIS — D509 Iron deficiency anemia, unspecified: Secondary | ICD-10-CM | POA: Diagnosis not present

## 2024-01-24 DIAGNOSIS — Z96641 Presence of right artificial hip joint: Secondary | ICD-10-CM | POA: Diagnosis not present

## 2024-01-24 DIAGNOSIS — E871 Hypo-osmolality and hyponatremia: Secondary | ICD-10-CM | POA: Diagnosis not present

## 2024-01-24 DIAGNOSIS — Z1239 Encounter for other screening for malignant neoplasm of breast: Secondary | ICD-10-CM

## 2024-01-24 DIAGNOSIS — I4891 Unspecified atrial fibrillation: Secondary | ICD-10-CM | POA: Diagnosis not present

## 2024-01-24 DIAGNOSIS — C50919 Malignant neoplasm of unspecified site of unspecified female breast: Secondary | ICD-10-CM | POA: Diagnosis not present

## 2024-01-24 DIAGNOSIS — I4892 Unspecified atrial flutter: Secondary | ICD-10-CM | POA: Diagnosis not present

## 2024-01-26 ENCOUNTER — Other Ambulatory Visit (HOSPITAL_BASED_OUTPATIENT_CLINIC_OR_DEPARTMENT_OTHER): Payer: Self-pay

## 2024-01-26 DIAGNOSIS — C787 Secondary malignant neoplasm of liver and intrahepatic bile duct: Secondary | ICD-10-CM | POA: Diagnosis not present

## 2024-01-26 DIAGNOSIS — I4891 Unspecified atrial fibrillation: Secondary | ICD-10-CM | POA: Diagnosis not present

## 2024-01-26 DIAGNOSIS — M80051D Age-related osteoporosis with current pathological fracture, right femur, subsequent encounter for fracture with routine healing: Secondary | ICD-10-CM | POA: Diagnosis not present

## 2024-01-26 DIAGNOSIS — C50919 Malignant neoplasm of unspecified site of unspecified female breast: Secondary | ICD-10-CM | POA: Diagnosis not present

## 2024-01-26 DIAGNOSIS — I4892 Unspecified atrial flutter: Secondary | ICD-10-CM | POA: Diagnosis not present

## 2024-01-26 DIAGNOSIS — Z96641 Presence of right artificial hip joint: Secondary | ICD-10-CM | POA: Diagnosis not present

## 2024-01-26 MED ORDER — HYDROCODONE-ACETAMINOPHEN 10-325 MG PO TABS
1.0000 | ORAL_TABLET | ORAL | 0 refills | Status: DC | PRN
  Filled 2024-01-26: qty 150, 25d supply, fill #0

## 2024-01-28 ENCOUNTER — Other Ambulatory Visit: Payer: Self-pay

## 2024-01-29 DIAGNOSIS — Z96641 Presence of right artificial hip joint: Secondary | ICD-10-CM | POA: Diagnosis not present

## 2024-01-29 DIAGNOSIS — I4891 Unspecified atrial fibrillation: Secondary | ICD-10-CM | POA: Diagnosis not present

## 2024-01-29 DIAGNOSIS — C787 Secondary malignant neoplasm of liver and intrahepatic bile duct: Secondary | ICD-10-CM | POA: Diagnosis not present

## 2024-01-29 DIAGNOSIS — M80051D Age-related osteoporosis with current pathological fracture, right femur, subsequent encounter for fracture with routine healing: Secondary | ICD-10-CM | POA: Diagnosis not present

## 2024-01-29 DIAGNOSIS — C50919 Malignant neoplasm of unspecified site of unspecified female breast: Secondary | ICD-10-CM | POA: Diagnosis not present

## 2024-01-29 DIAGNOSIS — I4892 Unspecified atrial flutter: Secondary | ICD-10-CM | POA: Diagnosis not present

## 2024-01-30 DIAGNOSIS — M1611 Unilateral primary osteoarthritis, right hip: Secondary | ICD-10-CM | POA: Diagnosis not present

## 2024-01-30 DIAGNOSIS — S32401D Unspecified fracture of right acetabulum, subsequent encounter for fracture with routine healing: Secondary | ICD-10-CM | POA: Diagnosis not present

## 2024-01-31 ENCOUNTER — Other Ambulatory Visit (HOSPITAL_BASED_OUTPATIENT_CLINIC_OR_DEPARTMENT_OTHER): Payer: Self-pay

## 2024-01-31 ENCOUNTER — Other Ambulatory Visit: Payer: Self-pay | Admitting: Cardiology

## 2024-01-31 DIAGNOSIS — C787 Secondary malignant neoplasm of liver and intrahepatic bile duct: Secondary | ICD-10-CM | POA: Diagnosis not present

## 2024-01-31 DIAGNOSIS — I4891 Unspecified atrial fibrillation: Secondary | ICD-10-CM | POA: Diagnosis not present

## 2024-01-31 DIAGNOSIS — I4811 Longstanding persistent atrial fibrillation: Secondary | ICD-10-CM

## 2024-01-31 DIAGNOSIS — I4892 Unspecified atrial flutter: Secondary | ICD-10-CM | POA: Diagnosis not present

## 2024-01-31 DIAGNOSIS — M80051D Age-related osteoporosis with current pathological fracture, right femur, subsequent encounter for fracture with routine healing: Secondary | ICD-10-CM | POA: Diagnosis not present

## 2024-01-31 DIAGNOSIS — Z96641 Presence of right artificial hip joint: Secondary | ICD-10-CM | POA: Diagnosis not present

## 2024-01-31 DIAGNOSIS — C50919 Malignant neoplasm of unspecified site of unspecified female breast: Secondary | ICD-10-CM | POA: Diagnosis not present

## 2024-01-31 NOTE — Telephone Encounter (Signed)
 Prescription refill request for Eliquis  received. Indication: Afib   Last office visit:11/29/23 Karen Allison)  Scr: 0.59 (12/23/23)  Age: 78 Weight: 62.5kg  Per office visit note on 09/26/23: Continue Eliquis  2.5 mg twice daily-as she is on reduced dose secondary to her primary cardiologist and her shared decision making secondary to frailty and propensity to falls   Refill sent.

## 2024-02-02 ENCOUNTER — Encounter: Payer: Self-pay | Admitting: Oncology

## 2024-02-02 ENCOUNTER — Other Ambulatory Visit (HOSPITAL_BASED_OUTPATIENT_CLINIC_OR_DEPARTMENT_OTHER): Payer: Self-pay

## 2024-02-02 ENCOUNTER — Inpatient Hospital Stay: Attending: Oncology

## 2024-02-02 VITALS — BP 151/80 | HR 100 | Temp 97.6°F | Resp 18 | Ht 61.0 in | Wt 134.2 lb

## 2024-02-02 DIAGNOSIS — C50912 Malignant neoplasm of unspecified site of left female breast: Secondary | ICD-10-CM

## 2024-02-02 DIAGNOSIS — E871 Hypo-osmolality and hyponatremia: Secondary | ICD-10-CM | POA: Insufficient documentation

## 2024-02-02 DIAGNOSIS — Z5112 Encounter for antineoplastic immunotherapy: Secondary | ICD-10-CM | POA: Insufficient documentation

## 2024-02-02 DIAGNOSIS — Z79620 Long term (current) use of immunosuppressive biologic: Secondary | ICD-10-CM | POA: Diagnosis not present

## 2024-02-02 DIAGNOSIS — D509 Iron deficiency anemia, unspecified: Secondary | ICD-10-CM | POA: Diagnosis not present

## 2024-02-02 DIAGNOSIS — M810 Age-related osteoporosis without current pathological fracture: Secondary | ICD-10-CM | POA: Insufficient documentation

## 2024-02-02 DIAGNOSIS — Z79899 Other long term (current) drug therapy: Secondary | ICD-10-CM | POA: Insufficient documentation

## 2024-02-02 DIAGNOSIS — Z853 Personal history of malignant neoplasm of breast: Secondary | ICD-10-CM | POA: Diagnosis not present

## 2024-02-02 MED ORDER — TRASTUZUMAB-ANNS CHEMO 150 MG IV SOLR
6.0000 mg/kg | Freq: Once | INTRAVENOUS | Status: AC
  Administered 2024-02-02: 378 mg via INTRAVENOUS
  Filled 2024-02-02: qty 18

## 2024-02-02 MED ORDER — SODIUM CHLORIDE 0.9 % IV SOLN: Freq: Once | INTRAVENOUS | Status: AC

## 2024-02-02 NOTE — Patient Instructions (Signed)
 Trastuzumab  Injection What is this medication? TRASTUZUMAB  (tras TOO zoo mab) treats breast cancer and stomach cancer. It works by blocking a protein that causes cancer cells to grow and multiply. This helps to slow or stop the spread of cancer cells. This medicine may be used for other purposes; ask your health care provider or pharmacist if you have questions. COMMON BRAND NAME(S): Herceptin , HERCESSI, Herzuma , KANJINTI , Ogivri , Ontruzant , Trazimera  What should I tell my care team before I take this medication? They need to know if you have any of these conditions: Heart failure Lung disease An unusual or allergic reaction to trastuzumab , other medications, foods, dyes, or preservatives Pregnant or trying to get pregnant Breast-feeding How should I use this medication? This medication is injected into a vein. It is given by your care team in a hospital or clinic setting. Talk to your care team about the use of this medication in children. It is not approved for use in children. Overdosage: If you think you have taken too much of this medicine contact a poison control center or emergency room at once. NOTE: This medicine is only for you. Do not share this medicine with others. What if I miss a dose? Keep appointments for follow-up doses. It is important not to miss your dose. Call your care team if you are unable to keep an appointment. What may interact with this medication? Certain types of chemotherapy, such as daunorubicin, doxorubicin, epirubicin, idarubicin This list may not describe all possible interactions. Give your health care provider a list of all the medicines, herbs, non-prescription drugs, or dietary supplements you use. Also tell them if you smoke, drink alcohol , or use illegal drugs. Some items may interact with your medicine. What should I watch for while using this medication? Your condition will be monitored carefully while you are receiving this medication. This  medication may make you feel generally unwell. This is not uncommon, as chemotherapy affects healthy cells as well as cancer cells. Report any side effects. Continue your course of treatment even though you feel ill unless your care team tells you to stop. This medication may increase your risk of getting an infection. Call your care team for advice if you get a fever, chills, sore throat, or other symptoms of a cold or flu. Do not treat yourself. Try to avoid being around people who are sick. Avoid taking medications that contain aspirin , acetaminophen , ibuprofen , naproxen, or ketoprofen unless instructed by your care team. These medications can hide a fever. Talk to your care team if you may be pregnant. Serious birth defects can occur if you take this medication during pregnancy and for 7 months after the last dose. You will need a negative pregnancy test before starting this medication. Contraception is recommended while taking this medication and for 7 months after the last dose. Your care team can help you find the option that works for you. Do not breastfeed while taking this medication and for 7 months after stopping treatment. What side effects may I notice from receiving this medication? Side effects that you should report to your care team as soon as possible: Allergic reactions or angioedema--skin rash, itching or hives, swelling of the face, eyes, lips, tongue, arms, or legs, trouble swallowing or breathing Dry cough, shortness of breath or trouble breathing Heart failure--shortness of breath, swelling of the ankles, feet, or hands, sudden weight gain, unusual weakness or fatigue Infection--fever, chills, cough, or sore throat Infusion reactions--chest pain, shortness of breath or trouble breathing, feeling faint  or lightheaded Side effects that usually do not require medical attention (report to your care team if they continue or are  bothersome): Diarrhea Dizziness Headache Nausea Trouble sleeping Vomiting This list may not describe all possible side effects. Call your doctor for medical advice about side effects. You may report side effects to FDA at 1-800-FDA-1088. Where should I keep my medication? This medication is given in a hospital or clinic. It will not be stored at home. NOTE: This sheet is a summary. It may not cover all possible information. If you have questions about this medicine, talk to your doctor, pharmacist, or health care provider.  2024 Elsevier/Gold Standard (2021-12-21 00:00:00) [facility] [place] - A DEPT OF [facility]  Discharge Instructions: Thank you for choosing [facility] to provide your oncology and hematology care.  If you have a lab appointment with the Cancer Center, please go directly to the Cancer Center and check in at the registration area.   Wear comfortable clothing and clothing appropriate for easy access to any Portacath or PICC line.   We strive to give you quality time with your provider. You may need to reschedule your appointment if you arrive late (15 or more minutes).  Arriving late affects you and other patients whose appointments are after yours.  Also, if you miss three or more appointments without notifying the office, you may be dismissed from the clinic at the provider's discretion.      For prescription refill requests, have your pharmacy contact our office and allow 72 hours for refills to be completed.    Today you received the following chemotherapy and/or immunotherapy agents TRASTUZUMAB       To help prevent nausea and vomiting after your treatment, we encourage you to take your nausea medication as directed.  BELOW ARE SYMPTOMS THAT SHOULD BE REPORTED IMMEDIATELY: *FEVER GREATER THAN 100.4 F (38 C) OR HIGHER *CHILLS OR SWEATING *NAUSEA AND VOMITING THAT IS NOT CONTROLLED WITH YOUR NAUSEA MEDICATION *UNUSUAL  SHORTNESS OF BREATH *UNUSUAL BRUISING OR BLEEDING *URINARY PROBLEMS (pain or burning when urinating, or frequent urination) *BOWEL PROBLEMS (unusual diarrhea, constipation, pain near the anus) TENDERNESS IN MOUTH AND THROAT WITH OR WITHOUT PRESENCE OF ULCERS (sore throat, sores in mouth, or a toothache) UNUSUAL RASH, SWELLING OR PAIN  UNUSUAL VAGINAL DISCHARGE OR ITCHING   Items with * indicate a potential emergency and should be followed up as soon as possible or go to the Emergency Department if any problems should occur.  Please show the CHEMOTHERAPY ALERT CARD or IMMUNOTHERAPY ALERT CARD at check-in to the Emergency Department and triage nurse.  Should you have questions after your visit or need to cancel or reschedule your appointment, please contact [facility] [place] - A DEPT OF [facility]  Dept: [phone]  and follow the prompts.  Office hours are 8:00 a.m. to 4:30 p.m. Monday - Friday. Please note that voicemails left after 4:00 p.m. may not be returned until the following business day.  We are closed weekends and major holidays. You have access to a nurse at all times for urgent questions. Please call the main number to the clinic Dept: [phone] and follow the prompts.  For any non-urgent questions, you may also contact your provider using MyChart. We now offer e-Visits for anyone 6 and older to request care online for non-urgent symptoms. For details visit [email or web address].   Also download the MyChart app! Go to the app store, search MyChart,  open the app, select [place], and log in with your MyChart username and password.

## 2024-02-06 ENCOUNTER — Other Ambulatory Visit: Payer: Self-pay

## 2024-02-06 ENCOUNTER — Ambulatory Visit: Payer: Medicare Other | Admitting: Cardiology

## 2024-02-06 DIAGNOSIS — Z96641 Presence of right artificial hip joint: Secondary | ICD-10-CM | POA: Diagnosis not present

## 2024-02-06 DIAGNOSIS — I4892 Unspecified atrial flutter: Secondary | ICD-10-CM | POA: Diagnosis not present

## 2024-02-06 DIAGNOSIS — C50919 Malignant neoplasm of unspecified site of unspecified female breast: Secondary | ICD-10-CM | POA: Diagnosis not present

## 2024-02-06 DIAGNOSIS — I4891 Unspecified atrial fibrillation: Secondary | ICD-10-CM | POA: Diagnosis not present

## 2024-02-06 DIAGNOSIS — M80051D Age-related osteoporosis with current pathological fracture, right femur, subsequent encounter for fracture with routine healing: Secondary | ICD-10-CM | POA: Diagnosis not present

## 2024-02-06 DIAGNOSIS — C787 Secondary malignant neoplasm of liver and intrahepatic bile duct: Secondary | ICD-10-CM | POA: Diagnosis not present

## 2024-02-06 MED ORDER — ESTRADIOL 0.1 MG/GM VA CREA: 1.0000 | TOPICAL_CREAM | VAGINAL | Status: DC

## 2024-02-08 DIAGNOSIS — E039 Hypothyroidism, unspecified: Secondary | ICD-10-CM | POA: Diagnosis not present

## 2024-02-08 DIAGNOSIS — Z0000 Encounter for general adult medical examination without abnormal findings: Secondary | ICD-10-CM | POA: Diagnosis not present

## 2024-02-08 DIAGNOSIS — C50912 Malignant neoplasm of unspecified site of left female breast: Secondary | ICD-10-CM | POA: Diagnosis not present

## 2024-02-08 DIAGNOSIS — Z1331 Encounter for screening for depression: Secondary | ICD-10-CM | POA: Diagnosis not present

## 2024-02-08 DIAGNOSIS — Z8679 Personal history of other diseases of the circulatory system: Secondary | ICD-10-CM | POA: Diagnosis not present

## 2024-02-08 DIAGNOSIS — I480 Paroxysmal atrial fibrillation: Secondary | ICD-10-CM | POA: Diagnosis not present

## 2024-02-08 DIAGNOSIS — Z6824 Body mass index (BMI) 24.0-24.9, adult: Secondary | ICD-10-CM | POA: Diagnosis not present

## 2024-02-09 ENCOUNTER — Other Ambulatory Visit (HOSPITAL_BASED_OUTPATIENT_CLINIC_OR_DEPARTMENT_OTHER): Payer: Self-pay

## 2024-02-09 DIAGNOSIS — C50919 Malignant neoplasm of unspecified site of unspecified female breast: Secondary | ICD-10-CM | POA: Diagnosis not present

## 2024-02-09 DIAGNOSIS — M80051D Age-related osteoporosis with current pathological fracture, right femur, subsequent encounter for fracture with routine healing: Secondary | ICD-10-CM | POA: Diagnosis not present

## 2024-02-09 DIAGNOSIS — I4891 Unspecified atrial fibrillation: Secondary | ICD-10-CM | POA: Diagnosis not present

## 2024-02-09 DIAGNOSIS — Z96641 Presence of right artificial hip joint: Secondary | ICD-10-CM | POA: Diagnosis not present

## 2024-02-09 DIAGNOSIS — C787 Secondary malignant neoplasm of liver and intrahepatic bile duct: Secondary | ICD-10-CM | POA: Diagnosis not present

## 2024-02-09 DIAGNOSIS — I4892 Unspecified atrial flutter: Secondary | ICD-10-CM | POA: Diagnosis not present

## 2024-02-13 DIAGNOSIS — C787 Secondary malignant neoplasm of liver and intrahepatic bile duct: Secondary | ICD-10-CM | POA: Diagnosis not present

## 2024-02-13 DIAGNOSIS — Z96641 Presence of right artificial hip joint: Secondary | ICD-10-CM | POA: Diagnosis not present

## 2024-02-13 DIAGNOSIS — I4892 Unspecified atrial flutter: Secondary | ICD-10-CM | POA: Diagnosis not present

## 2024-02-13 DIAGNOSIS — I4891 Unspecified atrial fibrillation: Secondary | ICD-10-CM | POA: Diagnosis not present

## 2024-02-13 DIAGNOSIS — M80051D Age-related osteoporosis with current pathological fracture, right femur, subsequent encounter for fracture with routine healing: Secondary | ICD-10-CM | POA: Diagnosis not present

## 2024-02-13 DIAGNOSIS — C50919 Malignant neoplasm of unspecified site of unspecified female breast: Secondary | ICD-10-CM | POA: Diagnosis not present

## 2024-02-14 ENCOUNTER — Other Ambulatory Visit: Payer: Self-pay

## 2024-02-15 DIAGNOSIS — I4892 Unspecified atrial flutter: Secondary | ICD-10-CM | POA: Diagnosis not present

## 2024-02-15 DIAGNOSIS — Z96641 Presence of right artificial hip joint: Secondary | ICD-10-CM | POA: Diagnosis not present

## 2024-02-15 DIAGNOSIS — I4891 Unspecified atrial fibrillation: Secondary | ICD-10-CM | POA: Diagnosis not present

## 2024-02-15 DIAGNOSIS — C787 Secondary malignant neoplasm of liver and intrahepatic bile duct: Secondary | ICD-10-CM | POA: Diagnosis not present

## 2024-02-15 DIAGNOSIS — M80051D Age-related osteoporosis with current pathological fracture, right femur, subsequent encounter for fracture with routine healing: Secondary | ICD-10-CM | POA: Diagnosis not present

## 2024-02-15 DIAGNOSIS — C50919 Malignant neoplasm of unspecified site of unspecified female breast: Secondary | ICD-10-CM | POA: Diagnosis not present

## 2024-02-16 ENCOUNTER — Other Ambulatory Visit: Payer: Self-pay | Admitting: Oncology

## 2024-02-16 ENCOUNTER — Telehealth: Payer: Self-pay

## 2024-02-16 ENCOUNTER — Encounter: Payer: Self-pay | Admitting: Oncology

## 2024-02-16 DIAGNOSIS — C50912 Malignant neoplasm of unspecified site of left female breast: Secondary | ICD-10-CM

## 2024-02-16 NOTE — Telephone Encounter (Signed)
 Pt called to report that her hip replacement surgeon requests that she skip the next Prolia  injection. He would like it held until August 2025.

## 2024-02-19 DIAGNOSIS — K5909 Other constipation: Secondary | ICD-10-CM | POA: Diagnosis not present

## 2024-02-19 DIAGNOSIS — F32A Depression, unspecified: Secondary | ICD-10-CM | POA: Diagnosis not present

## 2024-02-19 DIAGNOSIS — G8929 Other chronic pain: Secondary | ICD-10-CM | POA: Diagnosis not present

## 2024-02-19 DIAGNOSIS — Z96641 Presence of right artificial hip joint: Secondary | ICD-10-CM | POA: Diagnosis not present

## 2024-02-19 DIAGNOSIS — M4802 Spinal stenosis, cervical region: Secondary | ICD-10-CM | POA: Diagnosis not present

## 2024-02-19 DIAGNOSIS — C7800 Secondary malignant neoplasm of unspecified lung: Secondary | ICD-10-CM | POA: Diagnosis not present

## 2024-02-19 DIAGNOSIS — Z7901 Long term (current) use of anticoagulants: Secondary | ICD-10-CM | POA: Diagnosis not present

## 2024-02-19 DIAGNOSIS — E039 Hypothyroidism, unspecified: Secondary | ICD-10-CM | POA: Diagnosis not present

## 2024-02-19 DIAGNOSIS — F419 Anxiety disorder, unspecified: Secondary | ICD-10-CM | POA: Diagnosis not present

## 2024-02-19 DIAGNOSIS — E0500 Thyrotoxicosis with diffuse goiter without thyrotoxic crisis or storm: Secondary | ICD-10-CM | POA: Diagnosis not present

## 2024-02-19 DIAGNOSIS — J45909 Unspecified asthma, uncomplicated: Secondary | ICD-10-CM | POA: Diagnosis not present

## 2024-02-19 DIAGNOSIS — M80051D Age-related osteoporosis with current pathological fracture, right femur, subsequent encounter for fracture with routine healing: Secondary | ICD-10-CM | POA: Diagnosis not present

## 2024-02-19 DIAGNOSIS — C787 Secondary malignant neoplasm of liver and intrahepatic bile duct: Secondary | ICD-10-CM | POA: Diagnosis not present

## 2024-02-19 DIAGNOSIS — E785 Hyperlipidemia, unspecified: Secondary | ICD-10-CM | POA: Diagnosis not present

## 2024-02-19 DIAGNOSIS — C50919 Malignant neoplasm of unspecified site of unspecified female breast: Secondary | ICD-10-CM | POA: Diagnosis not present

## 2024-02-19 DIAGNOSIS — I4892 Unspecified atrial flutter: Secondary | ICD-10-CM | POA: Diagnosis not present

## 2024-02-19 DIAGNOSIS — I10 Essential (primary) hypertension: Secondary | ICD-10-CM | POA: Diagnosis not present

## 2024-02-19 DIAGNOSIS — K219 Gastro-esophageal reflux disease without esophagitis: Secondary | ICD-10-CM | POA: Diagnosis not present

## 2024-02-19 DIAGNOSIS — G4730 Sleep apnea, unspecified: Secondary | ICD-10-CM | POA: Diagnosis not present

## 2024-02-19 DIAGNOSIS — M47812 Spondylosis without myelopathy or radiculopathy, cervical region: Secondary | ICD-10-CM | POA: Diagnosis not present

## 2024-02-19 DIAGNOSIS — G4700 Insomnia, unspecified: Secondary | ICD-10-CM | POA: Diagnosis not present

## 2024-02-19 DIAGNOSIS — D509 Iron deficiency anemia, unspecified: Secondary | ICD-10-CM | POA: Diagnosis not present

## 2024-02-19 DIAGNOSIS — I4891 Unspecified atrial fibrillation: Secondary | ICD-10-CM | POA: Diagnosis not present

## 2024-02-19 DIAGNOSIS — M4135 Thoracogenic scoliosis, thoracolumbar region: Secondary | ICD-10-CM | POA: Diagnosis not present

## 2024-02-22 ENCOUNTER — Inpatient Hospital Stay: Attending: Oncology

## 2024-02-22 ENCOUNTER — Ambulatory Visit (HOSPITAL_BASED_OUTPATIENT_CLINIC_OR_DEPARTMENT_OTHER)
Admission: RE | Admit: 2024-02-22 | Discharge: 2024-02-22 | Disposition: A | Discharge status: Home / Self Care | Source: Ambulatory Visit | Attending: Oncology | Admitting: Oncology

## 2024-02-22 ENCOUNTER — Encounter (HOSPITAL_BASED_OUTPATIENT_CLINIC_OR_DEPARTMENT_OTHER): Payer: Self-pay | Admitting: Radiology

## 2024-02-22 ENCOUNTER — Encounter: Payer: Self-pay | Admitting: Oncology

## 2024-02-22 VITALS — BP 157/64 | HR 99 | Temp 98.2°F | Resp 18 | Wt 134.0 lb

## 2024-02-22 DIAGNOSIS — C787 Secondary malignant neoplasm of liver and intrahepatic bile duct: Secondary | ICD-10-CM | POA: Diagnosis not present

## 2024-02-22 DIAGNOSIS — C50912 Malignant neoplasm of unspecified site of left female breast: Secondary | ICD-10-CM | POA: Insufficient documentation

## 2024-02-22 DIAGNOSIS — Z5112 Encounter for antineoplastic immunotherapy: Secondary | ICD-10-CM | POA: Diagnosis not present

## 2024-02-22 DIAGNOSIS — Z800 Family history of malignant neoplasm of digestive organs: Secondary | ICD-10-CM | POA: Insufficient documentation

## 2024-02-22 DIAGNOSIS — Z1731 Human epidermal growth factor receptor 2 positive status: Secondary | ICD-10-CM | POA: Insufficient documentation

## 2024-02-22 DIAGNOSIS — Z79890 Hormone replacement therapy (postmenopausal): Secondary | ICD-10-CM | POA: Diagnosis not present

## 2024-02-22 DIAGNOSIS — Z79620 Long term (current) use of immunosuppressive biologic: Secondary | ICD-10-CM | POA: Insufficient documentation

## 2024-02-22 DIAGNOSIS — E871 Hypo-osmolality and hyponatremia: Secondary | ICD-10-CM | POA: Insufficient documentation

## 2024-02-22 DIAGNOSIS — I700 Atherosclerosis of aorta: Secondary | ICD-10-CM | POA: Insufficient documentation

## 2024-02-22 DIAGNOSIS — M549 Dorsalgia, unspecified: Secondary | ICD-10-CM | POA: Insufficient documentation

## 2024-02-22 DIAGNOSIS — Z1231 Encounter for screening mammogram for malignant neoplasm of breast: Secondary | ICD-10-CM

## 2024-02-22 DIAGNOSIS — C7800 Secondary malignant neoplasm of unspecified lung: Secondary | ICD-10-CM | POA: Diagnosis not present

## 2024-02-22 DIAGNOSIS — Z1721 Progesterone receptor positive status: Secondary | ICD-10-CM | POA: Insufficient documentation

## 2024-02-22 DIAGNOSIS — M25512 Pain in left shoulder: Secondary | ICD-10-CM | POA: Insufficient documentation

## 2024-02-22 DIAGNOSIS — E611 Iron deficiency: Secondary | ICD-10-CM | POA: Diagnosis not present

## 2024-02-22 DIAGNOSIS — Z1239 Encounter for other screening for malignant neoplasm of breast: Secondary | ICD-10-CM

## 2024-02-22 DIAGNOSIS — Z7901 Long term (current) use of anticoagulants: Secondary | ICD-10-CM | POA: Insufficient documentation

## 2024-02-22 DIAGNOSIS — Z881 Allergy status to other antibiotic agents status: Secondary | ICD-10-CM | POA: Insufficient documentation

## 2024-02-22 DIAGNOSIS — S32401A Unspecified fracture of right acetabulum, initial encounter for closed fracture: Secondary | ICD-10-CM | POA: Diagnosis not present

## 2024-02-22 DIAGNOSIS — Z79899 Other long term (current) drug therapy: Secondary | ICD-10-CM | POA: Diagnosis not present

## 2024-02-22 MED ORDER — TRASTUZUMAB-ANNS CHEMO 150 MG IV SOLR
6.0000 mg/kg | Freq: Once | INTRAVENOUS | Status: AC
  Administered 2024-02-22: 378 mg via INTRAVENOUS
  Filled 2024-02-22: qty 18

## 2024-02-22 MED ORDER — SODIUM CHLORIDE 0.9 % IV SOLN: Freq: Once | INTRAVENOUS | Status: AC

## 2024-02-22 NOTE — Patient Instructions (Signed)
 Trastuzumab Injection What is this medication? TRASTUZUMAB (tras TOO zoo mab) treats breast cancer and stomach cancer. It works by blocking a protein that causes cancer cells to grow and multiply. This helps to slow or stop the spread of cancer cells. This medicine may be used for other purposes; ask your health care provider or pharmacist if you have questions. COMMON BRAND NAME(S): Herceptin, HERCESSI, Herzuma, KANJINTI, Ogivri, Ontruzant, Trazimera What should I tell my care team before I take this medication? They need to know if you have any of these conditions: Heart failure Lung disease An unusual or allergic reaction to trastuzumab, other medications, foods, dyes, or preservatives Pregnant or trying to get pregnant Breast-feeding How should I use this medication? This medication is injected into a vein. It is given by your care team in a hospital or clinic setting. Talk to your care team about the use of this medication in children. It is not approved for use in children. Overdosage: If you think you have taken too much of this medicine contact a poison control center or emergency room at once. NOTE: This medicine is only for you. Do not share this medicine with others. What if I miss a dose? Keep appointments for follow-up doses. It is important not to miss your dose. Call your care team if you are unable to keep an appointment. What may interact with this medication? Certain types of chemotherapy, such as daunorubicin, doxorubicin, epirubicin, idarubicin This list may not describe all possible interactions. Give your health care provider a list of all the medicines, herbs, non-prescription drugs, or dietary supplements you use. Also tell them if you smoke, drink alcohol, or use illegal drugs. Some items may interact with your medicine. What should I watch for while using this medication? Your condition will be monitored carefully while you are receiving this medication. This  medication may make you feel generally unwell. This is not uncommon, as chemotherapy affects healthy cells as well as cancer cells. Report any side effects. Continue your course of treatment even though you feel ill unless your care team tells you to stop. This medication may increase your risk of getting an infection. Call your care team for advice if you get a fever, chills, sore throat, or other symptoms of a cold or flu. Do not treat yourself. Try to avoid being around people who are sick. Avoid taking medications that contain aspirin, acetaminophen, ibuprofen, naproxen, or ketoprofen unless instructed by your care team. These medications can hide a fever. Talk to your care team if you may be pregnant. Serious birth defects can occur if you take this medication during pregnancy and for 7 months after the last dose. You will need a negative pregnancy test before starting this medication. Contraception is recommended while taking this medication and for 7 months after the last dose. Your care team can help you find the option that works for you. Do not breastfeed while taking this medication and for 7 months after stopping treatment. What side effects may I notice from receiving this medication? Side effects that you should report to your care team as soon as possible: Allergic reactions or angioedema--skin rash, itching or hives, swelling of the face, eyes, lips, tongue, arms, or legs, trouble swallowing or breathing Dry cough, shortness of breath or trouble breathing Heart failure--shortness of breath, swelling of the ankles, feet, or hands, sudden weight gain, unusual weakness or fatigue Infection--fever, chills, cough, or sore throat Infusion reactions--chest pain, shortness of breath or trouble breathing, feeling faint  or lightheaded Side effects that usually do not require medical attention (report to your care team if they continue or are  bothersome): Diarrhea Dizziness Headache Nausea Trouble sleeping Vomiting This list may not describe all possible side effects. Call your doctor for medical advice about side effects. You may report side effects to FDA at 1-800-FDA-1088. Where should I keep my medication? This medication is given in a hospital or clinic. It will not be stored at home. NOTE: This sheet is a summary. It may not cover all possible information. If you have questions about this medicine, talk to your doctor, pharmacist, or health care provider.  2024 Elsevier/Gold Standard (2021-12-21 00:00:00)

## 2024-02-27 ENCOUNTER — Encounter: Payer: Self-pay | Admitting: Oncology

## 2024-02-28 ENCOUNTER — Other Ambulatory Visit (HOSPITAL_BASED_OUTPATIENT_CLINIC_OR_DEPARTMENT_OTHER): Payer: Self-pay

## 2024-02-28 MED ORDER — HYDROCODONE-ACETAMINOPHEN 10-325 MG PO TABS
1.0000 | ORAL_TABLET | ORAL | 0 refills | Status: DC | PRN
  Filled 2024-02-28: qty 150, 25d supply, fill #0

## 2024-02-29 ENCOUNTER — Encounter (HOSPITAL_BASED_OUTPATIENT_CLINIC_OR_DEPARTMENT_OTHER): Payer: Self-pay

## 2024-02-29 DIAGNOSIS — C787 Secondary malignant neoplasm of liver and intrahepatic bile duct: Secondary | ICD-10-CM | POA: Diagnosis not present

## 2024-02-29 DIAGNOSIS — C50919 Malignant neoplasm of unspecified site of unspecified female breast: Secondary | ICD-10-CM | POA: Diagnosis not present

## 2024-02-29 DIAGNOSIS — Z96641 Presence of right artificial hip joint: Secondary | ICD-10-CM | POA: Diagnosis not present

## 2024-02-29 DIAGNOSIS — I4891 Unspecified atrial fibrillation: Secondary | ICD-10-CM | POA: Diagnosis not present

## 2024-02-29 DIAGNOSIS — M80051D Age-related osteoporosis with current pathological fracture, right femur, subsequent encounter for fracture with routine healing: Secondary | ICD-10-CM | POA: Diagnosis not present

## 2024-02-29 DIAGNOSIS — I4892 Unspecified atrial flutter: Secondary | ICD-10-CM | POA: Diagnosis not present

## 2024-02-29 NOTE — Progress Notes (Signed)
 [facility]  [address] [place],  [place]  [address] [phone]  Clinic Day:03/01/24  Referring physician: Jefferey Fitch, MD  CHIEF COMPLAINT:  CC: Stage IV breast cancer  Current Treatment:  Trastuzumab  IV every 3 weeks  HISTORY OF PRESENT ILLNESS:  Karen Allison is a 78 y.o. 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  Estrogen and progesterone receptors were positive and HER2 was positive, and scans revealed liver metastases.  She was placed on a clinical trial at [facility] consisting of letrozole and trastuzumab , but she stopped the letrozole after several months because of severe arthralgias.  She also has known osteoarthritis, degenerative disc disease, and osteoporosis, and has had several back surgeries.  She is been on single agent trastuzumab  now for over 12 years with good control of her disease.  This was stopped temporarily in 2011 but a PET scan a few months later revealed a new 9 mm lung nodule felt to represent recurrent disease, which did resolve after she was placed back on the trastuzumab .  She sees Dr. Burnard Allison at [facility] yearly now, usually in July and he does scans and labs at that time.  We do perform periodic echocardiograms and her last one was in July.  She did have her last bone density scan in July of 2015, with no change, and receives Reclast  every 6 months.  She had cervical disc surgery in September of 2016, and then developed a hospital-acquired pneumonia which took a long time to clear.  She had colonoscopy within the last 2 years.  She had a fall in September with 2 fractured ribs.  Her medication list was reviewed.  When I review her Reclast  doses, she received this every 6 months from July of 2014 to January of 2016, and then yearly, with the last dose in January of 2017.  She was having some tooth sensitivity but  this is no longer an issue.  I did review her family history and the patient had endometrial cancer in her 36's and breast cancer in her 20's. She has a first cousin who had breast cancer and a paternal grandmother who had liver cancer, possibly metastatic.  An aunt had liver cancer.  A step uncle had multiple daughters with breast cancer.  The patient was seen in November of 2017 because of a tenderness and possible lump in the right upper outer quadrant.  A mammogram and ultrasound just revealed normal fibro fatty tissue.  She was evaluated at [facility] by Dr. Burnard Allison in July of 2017 and there were some concerns about her scans.  There appeared to be a rim enhancing fluid collection intramuscular in the left vastus lateralis muscle measuring 2 cm which appeared new.  There was a new right posterior 6th rib expansile sclerotic lesion measuring 2.3 cm as well as an old healed right 5th rib fracture which had been stable from the year before.  She did have an MRI of the hip in August which was nonspecific with extensive postoperative findings in the lumbar spine as well as osteoarthritis, osteophytes, and degenerative changes.  There was a simple appearing fluid collection interposed between the iliotibial band and proximal margin of the vastus lateralis consistent with a small ganglion cyst or prior shearing injury.  We repeated the CT of the chest in January of 2018 to follow up on the expansile lesion  of the rib and this remained stable, and consistent with posttraumatic fracture.  She also fractured her right scapula after a fall and she has fractured several ribs on the right.  Her last ECHO in March 2022 remains normal, with an EF of 55 to 60%.  INTERVAL HISTORY:  Karen Allison is here for a follow up for stage IV breast cancer and is on maintenance Herceptin  for many years. Patient states that she feels well but complains of left shoulder pain. She has been having problems with getting a properly fitting bra. I  gave her a pamphlet for [facility] [employer] in [place]. She had a severe fracture of her right hip with complete reconstruction surgery and she is finally healing well. She had a PET scan done on 12/04/2023 which revealed a small 5 mm subcutaneous nodule along the anterior aspect of the right anterior deltoid muscle with hypermetabolism with no other findings for metastatic disease. Stable extensive surgical changes related to the right acetabular fractures with diffuse not unexpected hypermetabolism, stable fusion hardware involving the spine, and stable advanced arthrosclerotic calcifications involving the aorta and iliac arteries were also noted. She had a screening bilateral mammogram done on 02/22/2024 which was clear. I brought her in today to discuss the PET scan and we reviewed the images together. When I examine her I cannot find an corresponding lesion of the right anterior shoulder, which appears to be right beneath the skin. There might be a small soft cyst but nothing definite. At this time we have decided to just monitor this area since it was a solitary finding and not likely malignant. I will continue to examine her every 6 months and she will tell me if something develops sooner than that. She does physical therapy multiple times a month.  She informed me that her orthopedic surgeon instructed her to hold off on her Prolia  injection until her follow-up with him in August due to concern of healing of the bone. Her day 1 cycle 63 of Kanjinti  is scheduled on 03/15/2024. Her last bone density scan was done in May, 2023 and will schedule this now. I will see her back in  6 months with CBC and CMP. She denies fever, chills, night sweats, or other signs of infection. She denies cardiorespiratory and gastrointestinal issues. She  denies pain. Her appetite is good and Her weight has decreased 3 pounds over last 4 weeks.   REVIEW OF SYSTEMS:  Review of Systems  Constitutional: Negative.  Negative for  appetite change, chills, diaphoresis, fatigue, fever and unexpected weight change.  HENT:  Negative.  Negative for hearing loss, lump/mass, mouth sores, nosebleeds, sore throat, tinnitus, trouble swallowing and voice change.   Eyes: Negative.  Negative for eye problems and icterus.  Respiratory: Negative.  Negative for chest tightness, cough, hemoptysis, shortness of breath and wheezing.   Cardiovascular: Negative.  Negative for chest pain, leg swelling and palpitations.  Gastrointestinal: Negative.  Negative for abdominal distention, abdominal pain, blood in stool, constipation, diarrhea, nausea, rectal pain and vomiting.  Endocrine: Negative.   Genitourinary: Negative.  Negative for bladder incontinence, difficulty urinating, dyspareunia, dysuria, frequency, hematuria, menstrual problem, nocturia, pelvic pain, vaginal bleeding and vaginal discharge.   Musculoskeletal:  Positive for back pain and gait problem (using a walker). Negative for arthralgias, flank pain, myalgias, neck pain and neck stiffness.       She has osteopenia with many prior fractures severe right hip, leg, and groin pain Pain in her left shoulder  Skin: Negative.  Negative for itching, rash and wound.  Neurological:  Positive for gait problem (using a walker). Negative for dizziness, extremity weakness, headaches, light-headedness, numbness, seizures and speech difficulty.  Hematological: Negative.  Negative for adenopathy. Does not bruise/bleed easily.  Psychiatric/Behavioral:  Negative for confusion, decreased concentration, depression, sleep disturbance and suicidal ideas. The patient is not nervous/anxious.     VITALS:  Blood pressure 123/69, pulse 82, temperature 98 F (36.7 C), temperature source Oral, resp. rate 18, height 5' 1 (1.549 m), weight 131 lb 14.4 oz (59.8 kg), SpO2 100%.  Wt Readings from Last 3 Encounters:  03/15/24 129 lb (58.5 kg)  03/01/24 131 lb 14.4 oz (59.8 kg)  02/22/24 134 lb (60.8 kg)    Body  mass index is 24.92 kg/m.  Performance status (ECOG): 1 - Symptomatic but completely ambulatory  PHYSICAL EXAM:  Physical Exam Vitals and nursing note reviewed. Exam conducted with a chaperone present.  Constitutional:      General: She is not in acute distress.    Appearance: Normal appearance. She is normal weight. She is not ill-appearing, toxic-appearing or diaphoretic.  HENT:     Head: Normocephalic and atraumatic.     Right Ear: Tympanic membrane, ear canal and external ear normal. There is no impacted cerumen.     Left Ear: Tympanic membrane, ear canal and external ear normal. There is no impacted cerumen.     Nose: Nose normal. No congestion or rhinorrhea.     Mouth/Throat:     Mouth: Mucous membranes are moist.     Pharynx: Oropharynx is clear. No oropharyngeal exudate or posterior oropharyngeal erythema.  Eyes:     General: No scleral icterus.       Right eye: No discharge.        Left eye: No discharge.     Extraocular Movements: Extraocular movements intact.     Conjunctiva/sclera: Conjunctivae normal.     Pupils: Pupils are equal, round, and reactive to light.  Neck:     Vascular: No carotid bruit.  Cardiovascular:     Rate and Rhythm: Normal rate and regular rhythm.     Pulses: Normal pulses.     Heart sounds: Normal heart sounds. No murmur heard.    No friction rub. No gallop.  Pulmonary:     Effort: Pulmonary effort is normal. No respiratory distress.     Breath sounds: Normal breath sounds. No stridor. No wheezing, rhonchi or rales.  Chest:     Chest wall: No mass or tenderness.  Breasts:    Right: Normal. No mass.     Left: Normal. No mass.     Comments: Well healed scar above the left areolar complex on the left breast Well healed scar in the left axilla Both breasts are without masses. Abdominal:     General: Bowel sounds are normal. There is no distension.     Palpations: Abdomen is soft. There is no hepatomegaly, splenomegaly or mass.      Tenderness: There is no abdominal tenderness. There is no right CVA tenderness, left CVA tenderness, guarding or rebound.     Hernia: No hernia is present.     Comments: Long 4 inch incision in the anterior right hip area which is healing well  Musculoskeletal:        General: No swelling, tenderness, deformity or signs of injury. Normal range of motion.     Cervical back: Normal range of motion and neck supple. No rigidity or tenderness.     Right  lower leg: Edema (trace) present.     Left lower leg: Edema (trace) present.  Lymphadenopathy:     Cervical: No cervical adenopathy.  Skin:    General: Skin is warm and dry.     Coloration: Skin is not jaundiced or pale.     Findings: No bruising, erythema, lesion or rash.  Neurological:     General: No focal deficit present.     Mental Status: She is alert and oriented to person, place, and time. Mental status is at baseline.     Cranial Nerves: No cranial nerve deficit.     Sensory: No sensory deficit.     Motor: No weakness.     Coordination: Coordination normal.     Gait: Gait normal.     Deep Tendon Reflexes: Reflexes normal.  Psychiatric:        Mood and Affect: Mood normal.        Behavior: Behavior normal.        Thought Content: Thought content normal.        Judgment: Judgment normal.    LABS:      Latest Ref Rng & Units 12/23/2023    6:46 AM 12/22/2023    8:48 AM 12/15/2023    2:00 PM  CBC  WBC 4.0 - 10.5 K/uL 8.8  9.6  6.4   Hemoglobin 12.0 - 15.0 g/dL 89.2  88.8  86.0   Hematocrit 36.0 - 46.0 % 32.1  33.5  41.8   Platelets 150 - 400 K/uL 143  165  185       Latest Ref Rng & Units 12/23/2023    6:46 AM 12/22/2023    8:48 AM 12/15/2023    2:00 PM  CMP  Glucose 70 - 99 mg/dL 847  810  880   BUN 8 - 23 mg/dL 6  6  6    Creatinine 0.44 - 1.00 mg/dL 9.40  9.33  9.49   Sodium 135 - 145 mmol/L 128  128  129   Potassium 3.5 - 5.1 mmol/L 4.2  4.5  4.2   Chloride 98 - 111 mmol/L 96  94  94   CO2 22 - 32 mmol/L 26  23  26     Calcium  8.9 - 10.3 mg/dL 7.8  8.3  9.0   Total Protein 6.5 - 8.1 g/dL   6.7   Total Bilirubin 0.0 - 1.2 mg/dL   0.6   Alkaline Phos 38 - 126 U/L   84   AST 15 - 41 U/L   22   ALT 0 - 44 U/L   18    Lab Results  Component Value Date   LDH 230 03/10/2006   Lab Results  Component Value Date   TSH 1.825 07/01/2023   STUDIES:  EXAM: 02/22/2024 DIGITAL SCREENING BILATERAL MAMMOGRAM WITH TOMOSYNTHESIS AND CAD IMPRESSION: No mammographic evidence of malignancy.   EXAM: 12/04/2023 NUCLEAR MEDICINE PET WHOLE BODY IMPRESSION: 1. Small (5 mm) subcutaneous nodule along the anterior aspect of the right anterior deltoid muscle with hypermetabolism. Indeterminate finding but could not exclude the possibility of a metastatic subcutaneous nodule. MRI may be helpful for further evaluation. 2. No other findings for metastatic disease. 3. Stable extensive surgical changes related to the right acetabular fractures with diffuse not unexpected hypermetabolism. 4. Stable fusion hardware involving the spine. 5. Stable advanced atherosclerotic calcifications involving the aorta and iliac arteries. 6. Aortic atherosclerosis.  Allergies:  Allergies  Allergen Reactions   Cefuroxime Hives   Zithromax [Azithromycin] Hives  Current Medications: Current Outpatient Medications  Medication Sig Dispense Refill   acetaminophen  (TYLENOL ) 325 MG tablet Take 2 tablets (650 mg total) by mouth 3 (three) times daily as needed for mild pain (pain score 1-3).     apixaban  (ELIQUIS ) 5 MG TABS tablet TAKE 1/2 TABLET (2.5mg ) BY MOUTH TWICE DAILY 90 tablet 1   B Complex Vitamins (B COMPLEX PO) Take 1 tablet by mouth daily.     Calcium  Carbonate-Vitamin D  (CALTRATE 600+D PO) Take 1 tablet by mouth 2 (two) times daily.     cholecalciferol  (VITAMIN D3) 25 MCG (1000 UNIT) tablet Take 1 tablet (1,000 Units total) by mouth daily. 30 tablet 0   denosumab  (PROLIA ) 60 MG/ML SOSY injection Inject 60 mg into the skin every 6  (six) months. (Patient not taking: Reported on 03/01/2024)     diazepam  (VALIUM ) 10 MG tablet Take 1 tablet (10 mg total) by mouth at bedtime as needed. (Patient taking differently: Take 5-10 mg by mouth at bedtime as needed for sleep.) 30 tablet 5   diclofenac  Sodium (VOLTAREN ) 1 % GEL Apply 1 Application topically 4 (four) times daily as needed (pain).     docusate sodium  (COLACE) 100 MG capsule Take 2 capsules (200 mg total) by mouth daily after supper. (Patient taking differently: Take 200 mg by mouth at bedtime.)     doxycycline  (VIBRAMYCIN ) 50 MG capsule Take 50 mg by mouth daily as needed (rosacea). (Patient not taking: Reported on 12/15/2023)     estradiol  (ESTRACE ) 0.1 MG/GM vaginal cream Place 1 Applicatorful vaginally 2 (two) times a week.     fluconazole (DIFLUCAN) 150 MG tablet Take 150 mg by mouth daily as needed (irritation).     furosemide  (LASIX ) 20 MG tablet Take 20 mg by mouth as needed for edema or fluid.     gabapentin  (NEURONTIN ) 600 MG tablet Take 600-1,200 mg by mouth See admin instructions. 300 mg three times daily, 1200 mg at bedtime     HYDROcodone -acetaminophen  (NORCO) 10-325 MG tablet Take 1 tablet by mouth every 4-6 hours as needed. 150 tablet 0   levothyroxine  (SYNTHROID ) 50 MCG tablet Take 1 tablet (50 mcg total) by mouth daily. 30 tablet 0   methocarbamol  (ROBAXIN ) 500 MG tablet Take 1 tablet (500 mg total) by mouth 4 (four) times daily. 120 tablet 0   metoprolol  succinate (TOPROL -XL) 25 MG 24 hr tablet Take 1 tablet (25 mg total) by mouth daily. 90 tablet 2   Multiple Vitamin (MULTIVITAMIN WITH MINERALS) TABS Take 1 tablet by mouth daily.     omeprazole  (PRILOSEC) 20 MG capsule Take 1 capsule (20 mg total) by mouth daily. (Patient taking differently: Take 20 mg by mouth daily as needed (acid refux).) 30 capsule 0   ondansetron  (ZOFRAN ) 8 MG tablet Take 8 mg by mouth every 8 (eight) hours as needed for nausea or vomiting.     Potassium Chloride  ER 20 MEQ TBCR Take 20  mEq by mouth daily. Only takes when taking her fluid pill     ramelteon  (ROZEREM ) 8 MG tablet Take 1 tablet (8 mg total) by mouth at bedtime as needed. (Patient taking differently: Take 8 mg by mouth at bedtime as needed for sleep.) 30 tablet 3   rosuvastatin  (CRESTOR ) 10 MG tablet Take 10 mg by mouth once a week. Fridays     sennosides-docusate sodium  (SENOKOT-S) 8.6-50 MG tablet Take 2 tablets by mouth 2 (two) times daily. (Patient taking differently: Take 3-4 tablets by mouth at bedtime.)  tamsulosin  (FLOMAX ) 0.4 MG CAPS capsule Take 1 capsule (0.4 mg total) by mouth every evening. 30 capsule 0   trastuzumab  (HERCEPTIN ) 440 MG injection 440 mg by Intravenous (Continuous Infusion) route every 21 ( twenty-one) days. infusion every 3 weeks     No current facility-administered medications for this visit.   ASSESSMENT & PLAN:  Assessment:   1.  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 diagnosed in December 2003.  Subsequent x-rays have shown possible bone metastases as well.  She was treated for a brief time with hormonal therapy but was unable to tolerate that.  She currently is on trastuzumab  every 3 weeks and continues to have her disease under control. Her scans were done at [facility] in July of 2023, and were clear. She has now asked me to take over her follow up and annual scans and I have agreed. The latest scans from July of 2024 show no evidence of recurrence or metastatic disease. I recommend a PET scan this year since she has known history of metastatic disease to liver and lung in the past. I usually do annual CT scans but I felt a PET would be more appropriate since a CT showed some scattered areas of relative lucency in the tibia and fibula. PET scan done on April 14th, 2025 revealed small 5 mm subcutaneous nodule along the anterior aspect of the right anterior deltoid muscle with hypermetabolism with no other findings for  metastatic disease. Stable extensive surgical changes related to the right acetabular fractures with diffuse not unexpected hypermetabolism, stable fusion hardware involving the spine, and stable advanced arthrosclerotic calcifications involving the aorta and iliac arteries was also noted.   2.  Osteoporosis, for which she was on yearly Reclast  since July 2014.  She has had many fractures of spine, ribs, scapula, and now the right acetabulum. However, she has completed 10 doses and so I recommended that we stop the Reclast  and continue to monitor her bone density.  Her bone density scan in May, 2023 was stable. We will plan to repeat one in the summer, but I feel she does need bone strengthening medicine in view of her many bone fractures. She will be due for her Prolia  injection in July, but her orthopedic surgeon has requested we delay until August.   3.  Mild chronic hyponatremia of unknown etiology.  Her sodium is stable was 131 last time but went down to 113 in late May and she was hospitalized.   4. Multiple old and new rib fractures. She now has to have a right hip replacement due to further problems with the hip and acetabulum.   5. Iron  deficiency, resolved with IV iron . We will monitor.   6. Small subcutaneous nodule along the anterior aspect of the right shoulder measuring 5 mm, which had an SUV of 6.22 on the PET scan. I cannot palpate a corresponding lesion on physical exam. Rather than pursue extensive imaging or biopsy, we have agreed to simply follow this area as it is not likely to be cancer.   Plan: She has been having problems with getting a properly fitting bra. I gave her a pamphlet for [facility] [employer] in [place]. She had a severe fracture of her right hip with complete reconstruction surgery and she is finally healing well. She had a PET scan done on 12/04/2023 which revealed small 5 mm subcutaneous nodule along the anterior aspect of the right anterior deltoid  muscle with  hypermetabolism with no other findings for metastatic disease. Stable extensive surgical changes related to the right acetabular fractures with diffuse not unexpected hypermetabolism, stable fusion hardware involving the spine, and stable advanced arthrosclerotic calcifications involving the aorta and iliac arteries was also noted. She also had a screening bilateral mammogram done on 02/22/2024 which clear. I brought her in today to discuss the PET scan and we reviewed the images together. When I examine her I cannot find an corresponding lesion of the right anterior shoulder. There might be a small soft cyst but nothing definite. At this time we have decided to just monitor this area since it was a solitary finding and not likely malignant. I will continue to examine her every 6 months and she will tell me if something develops sooner than that. She does physical therapy multiple times a month.  She informed me that her orthopedic surgeon instructed her to hold off on her Prolia  injection until her follow-up with him in August due to concern of healing of the bone. Her day 1 cycle 63 of Kanjinti  is scheduled on 03/15/2024. Her last bone density scan was done in May, 2023 and will schedule it for this month. I will see her back in  6 months with CBC and CMP. She knows to call sooner if problems arise regarding her breast cancer or its treatment.  She understands and agrees with this plan of care.   I provided 27 minutes of face-to-face time during this this encounter and > 50% was spent counseling as documented under my assessment and plan.   Wanda VEAR Cornish, MD [facility] [address] [place] [address] Dept: [phone] Dept Fax: [phone]   No orders of the defined types were placed in this encounter.  I,Jasmine M Lassiter,acting as a scribe for Wanda VEAR Cornish, MD.,have documented all relevant  documentation on the behalf of Wanda VEAR Cornish, MD,as directed by  Wanda VEAR Cornish, MD while in the presence of Wanda VEAR Cornish, MD.

## 2024-03-01 ENCOUNTER — Inpatient Hospital Stay

## 2024-03-01 ENCOUNTER — Inpatient Hospital Stay (HOSPITAL_BASED_OUTPATIENT_CLINIC_OR_DEPARTMENT_OTHER): Admitting: Oncology

## 2024-03-01 ENCOUNTER — Encounter: Payer: Self-pay | Admitting: Oncology

## 2024-03-01 ENCOUNTER — Other Ambulatory Visit: Payer: Self-pay | Admitting: Oncology

## 2024-03-01 VITALS — BP 123/69 | HR 82 | Temp 98.0°F | Resp 18 | Ht 61.0 in | Wt 131.9 lb

## 2024-03-01 DIAGNOSIS — C787 Secondary malignant neoplasm of liver and intrahepatic bile duct: Secondary | ICD-10-CM | POA: Diagnosis not present

## 2024-03-01 DIAGNOSIS — C50912 Malignant neoplasm of unspecified site of left female breast: Secondary | ICD-10-CM | POA: Diagnosis not present

## 2024-03-01 DIAGNOSIS — Z170 Estrogen receptor positive status [ER+]: Secondary | ICD-10-CM

## 2024-03-01 DIAGNOSIS — M8000XG Age-related osteoporosis with current pathological fracture, unspecified site, subsequent encounter for fracture with delayed healing: Secondary | ICD-10-CM

## 2024-03-01 DIAGNOSIS — Z1721 Progesterone receptor positive status: Secondary | ICD-10-CM | POA: Diagnosis not present

## 2024-03-01 DIAGNOSIS — C7800 Secondary malignant neoplasm of unspecified lung: Secondary | ICD-10-CM | POA: Diagnosis not present

## 2024-03-01 DIAGNOSIS — M800B1G Age-related osteoporosis with current pathological fracture, right pelvis, subsequent encounter for fracture with delayed healing: Secondary | ICD-10-CM

## 2024-03-01 DIAGNOSIS — Z1731 Human epidermal growth factor receptor 2 positive status: Secondary | ICD-10-CM | POA: Diagnosis not present

## 2024-03-01 DIAGNOSIS — Z5112 Encounter for antineoplastic immunotherapy: Secondary | ICD-10-CM | POA: Diagnosis not present

## 2024-03-02 ENCOUNTER — Other Ambulatory Visit: Payer: Self-pay

## 2024-03-05 DIAGNOSIS — M80051D Age-related osteoporosis with current pathological fracture, right femur, subsequent encounter for fracture with routine healing: Secondary | ICD-10-CM | POA: Diagnosis not present

## 2024-03-05 DIAGNOSIS — I4892 Unspecified atrial flutter: Secondary | ICD-10-CM | POA: Diagnosis not present

## 2024-03-05 DIAGNOSIS — I4891 Unspecified atrial fibrillation: Secondary | ICD-10-CM | POA: Diagnosis not present

## 2024-03-05 DIAGNOSIS — C50919 Malignant neoplasm of unspecified site of unspecified female breast: Secondary | ICD-10-CM | POA: Diagnosis not present

## 2024-03-05 DIAGNOSIS — C787 Secondary malignant neoplasm of liver and intrahepatic bile duct: Secondary | ICD-10-CM | POA: Diagnosis not present

## 2024-03-05 DIAGNOSIS — Z96641 Presence of right artificial hip joint: Secondary | ICD-10-CM | POA: Diagnosis not present

## 2024-03-11 MED ORDER — ESTRADIOL 0.1 MG/GM VA CREA: 1.0000 | TOPICAL_CREAM | VAGINAL | Status: DC

## 2024-03-12 DIAGNOSIS — S32401D Unspecified fracture of right acetabulum, subsequent encounter for fracture with routine healing: Secondary | ICD-10-CM | POA: Diagnosis not present

## 2024-03-12 DIAGNOSIS — M1611 Unilateral primary osteoarthritis, right hip: Secondary | ICD-10-CM | POA: Diagnosis not present

## 2024-03-13 ENCOUNTER — Other Ambulatory Visit (HOSPITAL_BASED_OUTPATIENT_CLINIC_OR_DEPARTMENT_OTHER): Payer: Self-pay

## 2024-03-15 ENCOUNTER — Ambulatory Visit (HOSPITAL_BASED_OUTPATIENT_CLINIC_OR_DEPARTMENT_OTHER)
Admission: RE | Admit: 2024-03-15 | Discharge: 2024-03-15 | Disposition: A | Discharge status: Home / Self Care | Source: Ambulatory Visit | Attending: Oncology | Admitting: Oncology

## 2024-03-15 ENCOUNTER — Inpatient Hospital Stay

## 2024-03-15 ENCOUNTER — Other Ambulatory Visit: Payer: Self-pay | Admitting: *Deleted

## 2024-03-15 ENCOUNTER — Inpatient Hospital Stay
Admission: RE | Admit: 2024-03-15 | Discharge: 2024-03-15 | Disposition: A | Discharge status: Home / Self Care | Payer: Self-pay | Source: Ambulatory Visit | Attending: Oncology | Admitting: Oncology

## 2024-03-15 VITALS — BP 132/68 | HR 89 | Temp 98.1°F | Resp 18 | Ht 61.0 in | Wt 129.0 lb

## 2024-03-15 DIAGNOSIS — C50912 Malignant neoplasm of unspecified site of left female breast: Secondary | ICD-10-CM

## 2024-03-15 DIAGNOSIS — Z5112 Encounter for antineoplastic immunotherapy: Secondary | ICD-10-CM | POA: Diagnosis not present

## 2024-03-15 DIAGNOSIS — Z1721 Progesterone receptor positive status: Secondary | ICD-10-CM | POA: Diagnosis not present

## 2024-03-15 DIAGNOSIS — C787 Secondary malignant neoplasm of liver and intrahepatic bile duct: Secondary | ICD-10-CM | POA: Diagnosis not present

## 2024-03-15 DIAGNOSIS — C7800 Secondary malignant neoplasm of unspecified lung: Secondary | ICD-10-CM | POA: Diagnosis not present

## 2024-03-15 DIAGNOSIS — M8000XG Age-related osteoporosis with current pathological fracture, unspecified site, subsequent encounter for fracture with delayed healing: Secondary | ICD-10-CM

## 2024-03-15 DIAGNOSIS — Z1731 Human epidermal growth factor receptor 2 positive status: Secondary | ICD-10-CM | POA: Diagnosis not present

## 2024-03-15 DIAGNOSIS — M800B1G Age-related osteoporosis with current pathological fracture, right pelvis, subsequent encounter for fracture with delayed healing: Secondary | ICD-10-CM | POA: Diagnosis not present

## 2024-03-15 DIAGNOSIS — Z780 Asymptomatic menopausal state: Secondary | ICD-10-CM | POA: Diagnosis not present

## 2024-03-15 DIAGNOSIS — M810 Age-related osteoporosis without current pathological fracture: Secondary | ICD-10-CM | POA: Diagnosis not present

## 2024-03-15 MED ORDER — TRASTUZUMAB-ANNS CHEMO 150 MG IV SOLR
6.0000 mg/kg | Freq: Once | INTRAVENOUS | Status: AC
  Administered 2024-03-15: 378 mg via INTRAVENOUS
  Filled 2024-03-15: qty 18

## 2024-03-15 MED ORDER — SODIUM CHLORIDE 0.9 % IV SOLN
Freq: Once | INTRAVENOUS | Status: AC
Start: 2024-03-15 — End: 2024-03-15

## 2024-03-15 NOTE — Patient Instructions (Signed)
 Trastuzumab Injection What is this medication? TRASTUZUMAB (tras TOO zoo mab) treats breast cancer and stomach cancer. It works by blocking a protein that causes cancer cells to grow and multiply. This helps to slow or stop the spread of cancer cells. This medicine may be used for other purposes; ask your health care provider or pharmacist if you have questions. COMMON BRAND NAME(S): Herceptin, HERCESSI, Herzuma, KANJINTI, Ogivri, Ontruzant, Trazimera What should I tell my care team before I take this medication? They need to know if you have any of these conditions: Heart failure Lung disease An unusual or allergic reaction to trastuzumab, other medications, foods, dyes, or preservatives Pregnant or trying to get pregnant Breast-feeding How should I use this medication? This medication is injected into a vein. It is given by your care team in a hospital or clinic setting. Talk to your care team about the use of this medication in children. It is not approved for use in children. Overdosage: If you think you have taken too much of this medicine contact a poison control center or emergency room at once. NOTE: This medicine is only for you. Do not share this medicine with others. What if I miss a dose? Keep appointments for follow-up doses. It is important not to miss your dose. Call your care team if you are unable to keep an appointment. What may interact with this medication? Certain types of chemotherapy, such as daunorubicin, doxorubicin, epirubicin, idarubicin This list may not describe all possible interactions. Give your health care provider a list of all the medicines, herbs, non-prescription drugs, or dietary supplements you use. Also tell them if you smoke, drink alcohol, or use illegal drugs. Some items may interact with your medicine. What should I watch for while using this medication? Your condition will be monitored carefully while you are receiving this medication. This  medication may make you feel generally unwell. This is not uncommon, as chemotherapy affects healthy cells as well as cancer cells. Report any side effects. Continue your course of treatment even though you feel ill unless your care team tells you to stop. This medication may increase your risk of getting an infection. Call your care team for advice if you get a fever, chills, sore throat, or other symptoms of a cold or flu. Do not treat yourself. Try to avoid being around people who are sick. Avoid taking medications that contain aspirin, acetaminophen, ibuprofen, naproxen, or ketoprofen unless instructed by your care team. These medications can hide a fever. Talk to your care team if you may be pregnant. Serious birth defects can occur if you take this medication during pregnancy and for 7 months after the last dose. You will need a negative pregnancy test before starting this medication. Contraception is recommended while taking this medication and for 7 months after the last dose. Your care team can help you find the option that works for you. Do not breastfeed while taking this medication and for 7 months after stopping treatment. What side effects may I notice from receiving this medication? Side effects that you should report to your care team as soon as possible: Allergic reactions or angioedema--skin rash, itching or hives, swelling of the face, eyes, lips, tongue, arms, or legs, trouble swallowing or breathing Dry cough, shortness of breath or trouble breathing Heart failure--shortness of breath, swelling of the ankles, feet, or hands, sudden weight gain, unusual weakness or fatigue Infection--fever, chills, cough, or sore throat Infusion reactions--chest pain, shortness of breath or trouble breathing, feeling faint  or lightheaded Side effects that usually do not require medical attention (report to your care team if they continue or are  bothersome): Diarrhea Dizziness Headache Nausea Trouble sleeping Vomiting This list may not describe all possible side effects. Call your doctor for medical advice about side effects. You may report side effects to FDA at 1-800-FDA-1088. Where should I keep my medication? This medication is given in a hospital or clinic. It will not be stored at home. NOTE: This sheet is a summary. It may not cover all possible information. If you have questions about this medicine, talk to your doctor, pharmacist, or health care provider.  2024 Elsevier/Gold Standard (2021-12-21 00:00:00)

## 2024-03-19 ENCOUNTER — Encounter: Payer: Self-pay | Admitting: Oncology

## 2024-03-21 DIAGNOSIS — M25512 Pain in left shoulder: Secondary | ICD-10-CM | POA: Diagnosis not present

## 2024-03-26 DIAGNOSIS — M542 Cervicalgia: Secondary | ICD-10-CM | POA: Diagnosis not present

## 2024-03-26 DIAGNOSIS — M6281 Muscle weakness (generalized): Secondary | ICD-10-CM | POA: Diagnosis not present

## 2024-03-26 DIAGNOSIS — R296 Repeated falls: Secondary | ICD-10-CM | POA: Diagnosis not present

## 2024-03-26 DIAGNOSIS — M1611 Unilateral primary osteoarthritis, right hip: Secondary | ICD-10-CM | POA: Diagnosis not present

## 2024-03-26 DIAGNOSIS — R293 Abnormal posture: Secondary | ICD-10-CM | POA: Diagnosis not present

## 2024-03-26 DIAGNOSIS — M25512 Pain in left shoulder: Secondary | ICD-10-CM | POA: Diagnosis not present

## 2024-03-26 DIAGNOSIS — M25651 Stiffness of right hip, not elsewhere classified: Secondary | ICD-10-CM | POA: Diagnosis not present

## 2024-03-26 DIAGNOSIS — R2689 Other abnormalities of gait and mobility: Secondary | ICD-10-CM | POA: Diagnosis not present

## 2024-03-26 DIAGNOSIS — M25612 Stiffness of left shoulder, not elsewhere classified: Secondary | ICD-10-CM | POA: Diagnosis not present

## 2024-03-28 DIAGNOSIS — R2689 Other abnormalities of gait and mobility: Secondary | ICD-10-CM | POA: Diagnosis not present

## 2024-03-28 DIAGNOSIS — M25512 Pain in left shoulder: Secondary | ICD-10-CM | POA: Diagnosis not present

## 2024-03-28 DIAGNOSIS — M542 Cervicalgia: Secondary | ICD-10-CM | POA: Diagnosis not present

## 2024-03-28 DIAGNOSIS — M25612 Stiffness of left shoulder, not elsewhere classified: Secondary | ICD-10-CM | POA: Diagnosis not present

## 2024-03-28 DIAGNOSIS — M1611 Unilateral primary osteoarthritis, right hip: Secondary | ICD-10-CM | POA: Diagnosis not present

## 2024-03-28 DIAGNOSIS — M6281 Muscle weakness (generalized): Secondary | ICD-10-CM | POA: Diagnosis not present

## 2024-03-29 ENCOUNTER — Other Ambulatory Visit (HOSPITAL_BASED_OUTPATIENT_CLINIC_OR_DEPARTMENT_OTHER): Payer: Self-pay

## 2024-03-29 DIAGNOSIS — M542 Cervicalgia: Secondary | ICD-10-CM | POA: Diagnosis not present

## 2024-03-29 MED ORDER — HYDROCODONE-ACETAMINOPHEN 10-325 MG PO TABS
1.0000 | ORAL_TABLET | ORAL | 0 refills | Status: DC | PRN
  Filled 2024-03-29: qty 150, 25d supply, fill #0

## 2024-04-01 DIAGNOSIS — M1611 Unilateral primary osteoarthritis, right hip: Secondary | ICD-10-CM | POA: Diagnosis not present

## 2024-04-01 DIAGNOSIS — R2689 Other abnormalities of gait and mobility: Secondary | ICD-10-CM | POA: Diagnosis not present

## 2024-04-01 DIAGNOSIS — M542 Cervicalgia: Secondary | ICD-10-CM | POA: Diagnosis not present

## 2024-04-01 DIAGNOSIS — M6281 Muscle weakness (generalized): Secondary | ICD-10-CM | POA: Diagnosis not present

## 2024-04-01 DIAGNOSIS — M25512 Pain in left shoulder: Secondary | ICD-10-CM | POA: Diagnosis not present

## 2024-04-01 DIAGNOSIS — M25612 Stiffness of left shoulder, not elsewhere classified: Secondary | ICD-10-CM | POA: Diagnosis not present

## 2024-04-02 ENCOUNTER — Other Ambulatory Visit (HOSPITAL_BASED_OUTPATIENT_CLINIC_OR_DEPARTMENT_OTHER): Payer: Self-pay | Admitting: Neurosurgery

## 2024-04-02 DIAGNOSIS — M542 Cervicalgia: Secondary | ICD-10-CM

## 2024-04-04 ENCOUNTER — Ambulatory Visit (HOSPITAL_BASED_OUTPATIENT_CLINIC_OR_DEPARTMENT_OTHER)
Admission: RE | Admit: 2024-04-04 | Discharge: 2024-04-04 | Disposition: A | Discharge status: Home / Self Care | Source: Ambulatory Visit | Attending: Neurosurgery | Admitting: Neurosurgery

## 2024-04-04 DIAGNOSIS — M542 Cervicalgia: Secondary | ICD-10-CM | POA: Diagnosis not present

## 2024-04-04 DIAGNOSIS — M6281 Muscle weakness (generalized): Secondary | ICD-10-CM | POA: Diagnosis not present

## 2024-04-04 DIAGNOSIS — M25512 Pain in left shoulder: Secondary | ICD-10-CM | POA: Diagnosis not present

## 2024-04-04 DIAGNOSIS — M1611 Unilateral primary osteoarthritis, right hip: Secondary | ICD-10-CM | POA: Diagnosis not present

## 2024-04-04 DIAGNOSIS — R2689 Other abnormalities of gait and mobility: Secondary | ICD-10-CM | POA: Diagnosis not present

## 2024-04-04 DIAGNOSIS — M25612 Stiffness of left shoulder, not elsewhere classified: Secondary | ICD-10-CM | POA: Diagnosis not present

## 2024-04-05 ENCOUNTER — Inpatient Hospital Stay: Attending: Oncology

## 2024-04-05 ENCOUNTER — Other Ambulatory Visit: Payer: Self-pay | Admitting: Obstetrics and Gynecology

## 2024-04-05 VITALS — BP 160/64 | HR 92 | Temp 98.4°F | Resp 18 | Ht 61.0 in | Wt 126.0 lb

## 2024-04-05 DIAGNOSIS — C787 Secondary malignant neoplasm of liver and intrahepatic bile duct: Secondary | ICD-10-CM | POA: Insufficient documentation

## 2024-04-05 DIAGNOSIS — Z5112 Encounter for antineoplastic immunotherapy: Secondary | ICD-10-CM | POA: Diagnosis not present

## 2024-04-05 DIAGNOSIS — M810 Age-related osteoporosis without current pathological fracture: Secondary | ICD-10-CM | POA: Diagnosis not present

## 2024-04-05 DIAGNOSIS — C7800 Secondary malignant neoplasm of unspecified lung: Secondary | ICD-10-CM | POA: Diagnosis not present

## 2024-04-05 DIAGNOSIS — D509 Iron deficiency anemia, unspecified: Secondary | ICD-10-CM | POA: Diagnosis not present

## 2024-04-05 DIAGNOSIS — Z17410 Hormone receptor positive with human epidermal growth factor receptor 2 positive status: Secondary | ICD-10-CM | POA: Insufficient documentation

## 2024-04-05 DIAGNOSIS — C50912 Malignant neoplasm of unspecified site of left female breast: Secondary | ICD-10-CM | POA: Diagnosis not present

## 2024-04-05 DIAGNOSIS — Z79899 Other long term (current) drug therapy: Secondary | ICD-10-CM | POA: Diagnosis not present

## 2024-04-05 DIAGNOSIS — E871 Hypo-osmolality and hyponatremia: Secondary | ICD-10-CM | POA: Diagnosis not present

## 2024-04-05 MED ORDER — SODIUM CHLORIDE 0.9 % IV SOLN: Freq: Once | INTRAVENOUS | Status: AC

## 2024-04-05 MED ORDER — TRASTUZUMAB-ANNS CHEMO 150 MG IV SOLR
6.0000 mg/kg | Freq: Once | INTRAVENOUS | Status: AC
  Administered 2024-04-05: 378 mg via INTRAVENOUS
  Filled 2024-04-05: qty 18

## 2024-04-05 NOTE — Patient Instructions (Signed)
 Trastuzumab Injection What is this medication? TRASTUZUMAB (tras TOO zoo mab) treats breast cancer and stomach cancer. It works by blocking a protein that causes cancer cells to grow and multiply. This helps to slow or stop the spread of cancer cells. This medicine may be used for other purposes; ask your health care provider or pharmacist if you have questions. COMMON BRAND NAME(S): Herceptin, HERCESSI, Herzuma, KANJINTI, Ogivri, Ontruzant, Trazimera What should I tell my care team before I take this medication? They need to know if you have any of these conditions: Heart failure Lung disease An unusual or allergic reaction to trastuzumab, other medications, foods, dyes, or preservatives Pregnant or trying to get pregnant Breast-feeding How should I use this medication? This medication is injected into a vein. It is given by your care team in a hospital or clinic setting. Talk to your care team about the use of this medication in children. It is not approved for use in children. Overdosage: If you think you have taken too much of this medicine contact a poison control center or emergency room at once. NOTE: This medicine is only for you. Do not share this medicine with others. What if I miss a dose? Keep appointments for follow-up doses. It is important not to miss your dose. Call your care team if you are unable to keep an appointment. What may interact with this medication? Certain types of chemotherapy, such as daunorubicin, doxorubicin, epirubicin, idarubicin This list may not describe all possible interactions. Give your health care provider a list of all the medicines, herbs, non-prescription drugs, or dietary supplements you use. Also tell them if you smoke, drink alcohol, or use illegal drugs. Some items may interact with your medicine. What should I watch for while using this medication? Your condition will be monitored carefully while you are receiving this medication. This  medication may make you feel generally unwell. This is not uncommon, as chemotherapy affects healthy cells as well as cancer cells. Report any side effects. Continue your course of treatment even though you feel ill unless your care team tells you to stop. This medication may increase your risk of getting an infection. Call your care team for advice if you get a fever, chills, sore throat, or other symptoms of a cold or flu. Do not treat yourself. Try to avoid being around people who are sick. Avoid taking medications that contain aspirin, acetaminophen, ibuprofen, naproxen, or ketoprofen unless instructed by your care team. These medications can hide a fever. Talk to your care team if you may be pregnant. Serious birth defects can occur if you take this medication during pregnancy and for 7 months after the last dose. You will need a negative pregnancy test before starting this medication. Contraception is recommended while taking this medication and for 7 months after the last dose. Your care team can help you find the option that works for you. Do not breastfeed while taking this medication and for 7 months after stopping treatment. What side effects may I notice from receiving this medication? Side effects that you should report to your care team as soon as possible: Allergic reactions or angioedema--skin rash, itching or hives, swelling of the face, eyes, lips, tongue, arms, or legs, trouble swallowing or breathing Dry cough, shortness of breath or trouble breathing Heart failure--shortness of breath, swelling of the ankles, feet, or hands, sudden weight gain, unusual weakness or fatigue Infection--fever, chills, cough, or sore throat Infusion reactions--chest pain, shortness of breath or trouble breathing, feeling faint  or lightheaded Side effects that usually do not require medical attention (report to your care team if they continue or are  bothersome): Diarrhea Dizziness Headache Nausea Trouble sleeping Vomiting This list may not describe all possible side effects. Call your doctor for medical advice about side effects. You may report side effects to FDA at 1-800-FDA-1088. Where should I keep my medication? This medication is given in a hospital or clinic. It will not be stored at home. NOTE: This sheet is a summary. It may not cover all possible information. If you have questions about this medicine, talk to your doctor, pharmacist, or health care provider.  2024 Elsevier/Gold Standard (2021-12-21 00:00:00)

## 2024-04-08 DIAGNOSIS — M542 Cervicalgia: Secondary | ICD-10-CM | POA: Diagnosis not present

## 2024-04-08 DIAGNOSIS — M25512 Pain in left shoulder: Secondary | ICD-10-CM | POA: Diagnosis not present

## 2024-04-08 DIAGNOSIS — R2689 Other abnormalities of gait and mobility: Secondary | ICD-10-CM | POA: Diagnosis not present

## 2024-04-08 DIAGNOSIS — M1611 Unilateral primary osteoarthritis, right hip: Secondary | ICD-10-CM | POA: Diagnosis not present

## 2024-04-08 DIAGNOSIS — M6281 Muscle weakness (generalized): Secondary | ICD-10-CM | POA: Diagnosis not present

## 2024-04-08 DIAGNOSIS — M25612 Stiffness of left shoulder, not elsewhere classified: Secondary | ICD-10-CM | POA: Diagnosis not present

## 2024-04-10 DIAGNOSIS — M25612 Stiffness of left shoulder, not elsewhere classified: Secondary | ICD-10-CM | POA: Diagnosis not present

## 2024-04-10 DIAGNOSIS — R2689 Other abnormalities of gait and mobility: Secondary | ICD-10-CM | POA: Diagnosis not present

## 2024-04-10 DIAGNOSIS — M6281 Muscle weakness (generalized): Secondary | ICD-10-CM | POA: Diagnosis not present

## 2024-04-10 DIAGNOSIS — M542 Cervicalgia: Secondary | ICD-10-CM | POA: Diagnosis not present

## 2024-04-10 DIAGNOSIS — M25512 Pain in left shoulder: Secondary | ICD-10-CM | POA: Diagnosis not present

## 2024-04-10 DIAGNOSIS — M1611 Unilateral primary osteoarthritis, right hip: Secondary | ICD-10-CM | POA: Diagnosis not present

## 2024-04-12 NOTE — Progress Notes (Signed)
 Cardiology Office Note:    Date:  04/17/2024   ID:  Karen, Allison 1946-01-06, MRN [id number]  PCP:  Jefferey Fitch, MD  Cardiologist:  Redell Leiter, MD    Referring MD: Jefferey Fitch, MD    ASSESSMENT:    1. Longstanding persistent atrial fibrillation (HCC)   2. Chronic anticoagulation   3. Stage IV breast cancer in female (HCC)   4. Primary hypertension    PLAN:    In order of problems listed above:  She remains in atrial fibrillation chronic rate controlled anticoagulated relatively asymptomatic Hypertension is stable treated with as needed diuretic beta-blocker Continue her statin Stable breast cancer managed by oncology   Next appointment: 9-month   Medication Adjustments/Labs and Tests Ordered: Current medicines are reviewed at length with the patient today.  Concerns regarding medicines are outlined above.  No orders of the defined types were placed in this encounter.  No orders of the defined types were placed in this encounter.    History of Present Illness:    Karen Allison is a 78 y.o. female with a hx of longstanding persistent atrial fibrillation with anticoagulation  hypertension hyponatremia and breast cancer last seen 06/16/2023.  She had an echocardiogram performed February 2025 left ventricular function was normal EF 55 to 60% there is mild mitral regurgitation mild aortic regurgitation with aortic valve sclerosis and elevated left atrial pressure.  Compliance with diet, lifestyle and medications: Yes  In the interim she has had successful total hip replacement and has made a good recovery. She tolerates her anticoagulant without bleeding At not taking her diuretic recently and continues to take gabapentin  which has caused edema in the past She had an EKG last visit she is in rate controlled atrial fibrillation with a beta-blocker She is not having edema shortness of breath palp potation's syncope or chest pain Hemoglobin  November 10 .7 been evaluated and treated for iron  deficiency anemia last serum sodium 128 GFR greater than 60 cc Past Medical History:  Diagnosis Date   A-fib (HCC)    Acetabular fracture (HCC) 06/29/2023   Acute respiratory failure with hypoxia (HCC) 05/09/2014   Adult hypothyroidism 05/03/2013   Anemia    Iron  Deficiency   Anxiety    Arthritis    Asthma    As a child   Backache 07/08/2013   Breast cancer (HCC)    left   Cervical spondylosis without myelopathy 05/06/2014   C3-4     Depression    Dysrhythmia    A. Fib   Endometrial cancer (HCC) 01/21/1988   Fx lower humerus-closed 10/22/1997   Fx pelvis following insrt ortho implnt/prosth/bone plt (HCC) 07/03/2023   Gait disorder 07/05/2016   Graves' disease 07/16/2021   HCAP (healthcare-associated pneumonia) 05/09/2014   Hepatitis     Hep A years ago   History of corticosteroid therapy 02/13/2023   History of endocrine disorder 02/13/2023   HNP (herniated nucleus pulposus), cervical 05/06/2014   Hypertension    Hyponatremia 12/02/2021   Hypothyroidism    Graves disease, age 19 yo   Iron  deficiency anemia 09/08/2023   Malignant neoplasm of left breast (HCC) 06/05/2020   Neck pain 06/11/2013   Nose trouble    right nostril will hemorrhage at times   Osteoarthritis of right hip 12/21/2023   Osteoporosis 11/04/2020   Other forms of scoliosis, thoracolumbar region 11/03/2016   Pain 04/16/2024   Pain of right hip joint 10/06/2023   Pneumonia    Rib pain on left  side 09/26/2022   Rosacea    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   apixaban  (ELIQUIS ) 5 MG TABS tablet TAKE 1/2 TABLET (2.5mg ) BY MOUTH TWICE DAILY   B Complex Vitamins (B COMPLEX PO) Take 1 tablet by mouth daily.   Calcium  Carbonate-Vitamin D  (CALTRATE 600+D PO) Take 1 tablet by mouth 2 (two) times daily.   cholecalciferol  (VITAMIN D3) 25 MCG (1000 UNIT) tablet Take 1 tablet (1,000 Units total) by mouth daily.   diazepam  (VALIUM ) 10 MG tablet Take 1 tablet (10 mg total) by mouth at bedtime as needed. (Patient taking differently: Take 5-10 mg by mouth at bedtime as needed for sleep.)   diclofenac  Sodium (VOLTAREN ) 1 % GEL Apply 1 Application topically 4 (four) times daily as needed (pain).   docusate sodium  (COLACE) 100 MG capsule Take 2 capsules (200 mg total) by mouth daily after supper. (Patient taking differently: Take 200 mg by mouth at bedtime.)   estradiol  (ESTRACE ) 0.1 MG/GM vaginal cream PLACE 0.5 GRAMS VAGINALLY NIGHTLY FOR TWO WEEKS THEN 0.5 GRAMS VAGINALLY NIGHTLY TWICE PER WEEK   fluconazole (DIFLUCAN) 150 MG tablet Take 150 mg by mouth daily as needed (irritation).   furosemide  (LASIX ) 20 MG tablet Take 20 mg by mouth as needed for edema or fluid.   gabapentin  (NEURONTIN ) 600 MG tablet Take 600-1,200 mg by mouth See admin instructions. 300 mg three times daily, 1200 mg at bedtime   HYDROcodone -acetaminophen  (NORCO) 10-325 MG tablet Take 1 tablet by mouth every 4-6 hours as needed.   levothyroxine  (SYNTHROID ) 50 MCG tablet Take 1 tablet (50 mcg total) by mouth daily.   methocarbamol  (ROBAXIN ) 500 MG tablet Take 1 tablet (500 mg total) by mouth 4 (four) times daily.   metoprolol  succinate (TOPROL -XL) 25 MG 24 hr tablet Take 1 tablet (25 mg total) by mouth daily.   Multiple Vitamin (MULTIVITAMIN WITH MINERALS) TABS Take 1 tablet by mouth daily.   omeprazole  (PRILOSEC) 20 MG capsule Take 1 capsule (20 mg total) by mouth daily. (Patient taking differently: Take 20 mg by mouth daily as needed (acid refux).)    ondansetron  (ZOFRAN ) 8 MG tablet Take 8 mg by mouth every 8 (eight) hours as needed for nausea or vomiting.   Potassium Chloride  ER 20 MEQ TBCR Take 20 mEq by mouth daily. Only takes when taking her fluid pill   rosuvastatin  (CRESTOR ) 10 MG tablet Take 10 mg by mouth once a week. Fridays   sennosides-docusate sodium  (SENOKOT-S) 8.6-50 MG tablet Take 2 tablets by mouth 2 (two) times daily. (Patient taking differently: Take 3-4 tablets by mouth at bedtime.)   tamsulosin  (FLOMAX ) 0.4 MG CAPS capsule Take 1 capsule (0.4 mg total) by mouth every evening.   trastuzumab  (HERCEPTIN ) 440 MG injection 440 mg by Intravenous (Continuous Infusion) route every 21 ( twenty-one) days. infusion every 3 weeks      EKGs/Labs/Other Studies Reviewed:    The following studies were reviewed today:         Recent Labs:  07/01/2023: TSH 1.825 07/06/2023: Magnesium  1.7 12/15/2023: ALT 18 12/23/2023: BUN 6; Creatinine, Ser 0.59; Hemoglobin 10.7; Platelets 143; Potassium 4.2; Sodium 128  Recent Lipid Panel No results found for: CHOL, TRIG, HDL, CHOLHDL, VLDL, LDLCALC, LDLDIRECT  Physical Exam:    VS:  BP 116/76   Pulse (!) 54   Ht 5' 1 (1.549 m)   Wt 132 lb 3.2 oz (60 kg)   SpO2 94%   BMI 24.98 kg/m     Wt Readings from Last 3 Encounters:  04/17/24 132 lb 3.2 oz (60 kg)  04/05/24 126 lb (57.2 kg)  03/15/24 129 lb (58.5 kg)     GEN:  Well nourished, well developed in no acute distress HEENT: Normal NECK: No JVD; No carotid bruits LYMPHATICS: No lymphadenopathy CARDIAC: Irregular rate and rhythm RRR, no murmurs, rubs, gallops RESPIRATORY:  Clear to auscultation without rales, wheezing or rhonchi  ABDOMEN: Soft, non-tender, non-distended MUSCULOSKELETAL:  No edema; No deformity  SKIN: Warm and dry NEUROLOGIC:  Alert and oriented x 3 PSYCHIATRIC:  Normal affect    Signed, Redell Leiter, MD  04/17/2024 1:14 PM    [facility]

## 2024-04-15 DIAGNOSIS — M25612 Stiffness of left shoulder, not elsewhere classified: Secondary | ICD-10-CM | POA: Diagnosis not present

## 2024-04-15 DIAGNOSIS — R2689 Other abnormalities of gait and mobility: Secondary | ICD-10-CM | POA: Diagnosis not present

## 2024-04-15 DIAGNOSIS — M6281 Muscle weakness (generalized): Secondary | ICD-10-CM | POA: Diagnosis not present

## 2024-04-15 DIAGNOSIS — M1611 Unilateral primary osteoarthritis, right hip: Secondary | ICD-10-CM | POA: Diagnosis not present

## 2024-04-15 DIAGNOSIS — M25512 Pain in left shoulder: Secondary | ICD-10-CM | POA: Diagnosis not present

## 2024-04-15 DIAGNOSIS — M542 Cervicalgia: Secondary | ICD-10-CM | POA: Diagnosis not present

## 2024-04-16 ENCOUNTER — Other Ambulatory Visit: Payer: Self-pay

## 2024-04-16 DIAGNOSIS — R52 Pain, unspecified: Secondary | ICD-10-CM | POA: Insufficient documentation

## 2024-04-16 DIAGNOSIS — D649 Anemia, unspecified: Secondary | ICD-10-CM | POA: Insufficient documentation

## 2024-04-16 DIAGNOSIS — F32A Depression, unspecified: Secondary | ICD-10-CM | POA: Insufficient documentation

## 2024-04-16 DIAGNOSIS — I499 Cardiac arrhythmia, unspecified: Secondary | ICD-10-CM | POA: Insufficient documentation

## 2024-04-16 DIAGNOSIS — J45909 Unspecified asthma, uncomplicated: Secondary | ICD-10-CM | POA: Insufficient documentation

## 2024-04-16 DIAGNOSIS — F419 Anxiety disorder, unspecified: Secondary | ICD-10-CM | POA: Insufficient documentation

## 2024-04-17 ENCOUNTER — Ambulatory Visit: Attending: Cardiology | Admitting: Cardiology

## 2024-04-17 ENCOUNTER — Encounter: Payer: Self-pay | Admitting: Cardiology

## 2024-04-17 VITALS — BP 116/76 | HR 54 | Ht 61.0 in | Wt 132.2 lb

## 2024-04-17 DIAGNOSIS — I10 Essential (primary) hypertension: Secondary | ICD-10-CM | POA: Diagnosis not present

## 2024-04-17 DIAGNOSIS — Z7901 Long term (current) use of anticoagulants: Secondary | ICD-10-CM | POA: Diagnosis not present

## 2024-04-17 DIAGNOSIS — I4811 Longstanding persistent atrial fibrillation: Secondary | ICD-10-CM | POA: Insufficient documentation

## 2024-04-17 DIAGNOSIS — C50919 Malignant neoplasm of unspecified site of unspecified female breast: Secondary | ICD-10-CM | POA: Insufficient documentation

## 2024-04-17 NOTE — Patient Instructions (Signed)
 Medication Instructions:  Your physician recommends that you continue on your current medications as directed. Please refer to the Current Medication list given to you today.  *If you need a refill on your cardiac medications before your next appointment, please call your pharmacy*  Lab Work: None If you have labs (blood work) drawn today and your tests are completely normal, you will receive your results only by: MyChart Message (if you have MyChart) OR A paper copy in the mail If you have any lab test that is abnormal or we need to change your treatment, we will call you to review the results.  Testing/Procedures: None  Follow-Up: At [facility], you and your health needs are our priority.  As part of our continuing mission to provide you with exceptional heart care, our providers are all part of one team.  This team includes your primary Cardiologist (physician) and Advanced Practice Providers or APPs (Physician Assistants and Nurse Practitioners) who all work together to provide you with the care you need, when you need it.  Your next appointment:   9 month(s)  Provider:   Zoe Hinds, MD    We recommend signing up for the patient portal called "MyChart".  Sign up information is provided on this After Visit Summary.  MyChart is used to connect with patients for Virtual Visits (Telemedicine).  Patients are able to view lab/test results, encounter notes, upcoming appointments, etc.  Non-urgent messages can be sent to your provider as well.   To learn more about what you can do with MyChart, go to [email or web address].   Other Instructions None

## 2024-04-18 DIAGNOSIS — M25512 Pain in left shoulder: Secondary | ICD-10-CM | POA: Diagnosis not present

## 2024-04-18 DIAGNOSIS — R2689 Other abnormalities of gait and mobility: Secondary | ICD-10-CM | POA: Diagnosis not present

## 2024-04-18 DIAGNOSIS — M542 Cervicalgia: Secondary | ICD-10-CM | POA: Diagnosis not present

## 2024-04-18 DIAGNOSIS — M25612 Stiffness of left shoulder, not elsewhere classified: Secondary | ICD-10-CM | POA: Diagnosis not present

## 2024-04-18 DIAGNOSIS — M6281 Muscle weakness (generalized): Secondary | ICD-10-CM | POA: Diagnosis not present

## 2024-04-18 DIAGNOSIS — M1611 Unilateral primary osteoarthritis, right hip: Secondary | ICD-10-CM | POA: Diagnosis not present

## 2024-04-23 ENCOUNTER — Other Ambulatory Visit (HOSPITAL_BASED_OUTPATIENT_CLINIC_OR_DEPARTMENT_OTHER): Payer: Self-pay

## 2024-04-23 DIAGNOSIS — R296 Repeated falls: Secondary | ICD-10-CM | POA: Diagnosis not present

## 2024-04-23 DIAGNOSIS — M2560 Stiffness of unspecified joint, not elsewhere classified: Secondary | ICD-10-CM | POA: Diagnosis not present

## 2024-04-23 DIAGNOSIS — R2689 Other abnormalities of gait and mobility: Secondary | ICD-10-CM | POA: Diagnosis not present

## 2024-04-23 DIAGNOSIS — M1611 Unilateral primary osteoarthritis, right hip: Secondary | ICD-10-CM | POA: Diagnosis not present

## 2024-04-23 DIAGNOSIS — M6281 Muscle weakness (generalized): Secondary | ICD-10-CM | POA: Diagnosis not present

## 2024-04-23 DIAGNOSIS — M25512 Pain in left shoulder: Secondary | ICD-10-CM | POA: Diagnosis not present

## 2024-04-23 DIAGNOSIS — R293 Abnormal posture: Secondary | ICD-10-CM | POA: Diagnosis not present

## 2024-04-23 DIAGNOSIS — M542 Cervicalgia: Secondary | ICD-10-CM | POA: Diagnosis not present

## 2024-04-23 DIAGNOSIS — M25612 Stiffness of left shoulder, not elsewhere classified: Secondary | ICD-10-CM | POA: Diagnosis not present

## 2024-04-25 DIAGNOSIS — H04123 Dry eye syndrome of bilateral lacrimal glands: Secondary | ICD-10-CM | POA: Diagnosis not present

## 2024-04-25 DIAGNOSIS — H26491 Other secondary cataract, right eye: Secondary | ICD-10-CM | POA: Diagnosis not present

## 2024-04-25 DIAGNOSIS — Z961 Presence of intraocular lens: Secondary | ICD-10-CM | POA: Diagnosis not present

## 2024-04-25 DIAGNOSIS — H10823 Rosacea conjunctivitis, bilateral: Secondary | ICD-10-CM | POA: Diagnosis not present

## 2024-04-25 DIAGNOSIS — D3131 Benign neoplasm of right choroid: Secondary | ICD-10-CM | POA: Diagnosis not present

## 2024-04-26 ENCOUNTER — Inpatient Hospital Stay: Attending: Oncology

## 2024-04-26 ENCOUNTER — Inpatient Hospital Stay

## 2024-04-26 VITALS — BP 136/61 | HR 100 | Temp 98.7°F | Resp 18 | Wt 131.0 lb

## 2024-04-26 DIAGNOSIS — Z5112 Encounter for antineoplastic immunotherapy: Secondary | ICD-10-CM | POA: Diagnosis present

## 2024-04-26 DIAGNOSIS — Z170 Estrogen receptor positive status [ER+]: Secondary | ICD-10-CM | POA: Insufficient documentation

## 2024-04-26 DIAGNOSIS — C50912 Malignant neoplasm of unspecified site of left female breast: Secondary | ICD-10-CM | POA: Diagnosis present

## 2024-04-26 DIAGNOSIS — Z79899 Other long term (current) drug therapy: Secondary | ICD-10-CM | POA: Diagnosis not present

## 2024-04-26 DIAGNOSIS — D509 Iron deficiency anemia, unspecified: Secondary | ICD-10-CM

## 2024-04-26 DIAGNOSIS — Z79620 Long term (current) use of immunosuppressive biologic: Secondary | ICD-10-CM | POA: Diagnosis not present

## 2024-04-26 DIAGNOSIS — C787 Secondary malignant neoplasm of liver and intrahepatic bile duct: Secondary | ICD-10-CM | POA: Insufficient documentation

## 2024-04-26 LAB — CMP (CANCER CENTER ONLY)
ALT: 19 U/L (ref 0–44)
AST: 29 U/L (ref 15–41)
Albumin: 3.9 g/dL (ref 3.5–5.0)
Alkaline Phosphatase: 96 U/L (ref 38–126)
Anion gap: 12 (ref 5–15)
BUN: 11 mg/dL (ref 8–23)
CO2: 25 mmol/L (ref 22–32)
Calcium: 8.8 mg/dL — ABNORMAL LOW (ref 8.9–10.3)
Chloride: 97 mmol/L — ABNORMAL LOW (ref 98–111)
Creatinine: 0.69 mg/dL (ref 0.44–1.00)
GFR, Estimated: >60 mL/min (ref >60)
Glucose, Bld: 127 mg/dL — ABNORMAL HIGH (ref 70–99)
Potassium: 4 mmol/L (ref 3.5–5.1)
Sodium: 134 mmol/L — ABNORMAL LOW (ref 135–145)
Total Bilirubin: 0.5 mg/dL (ref 0.0–1.2)
Total Protein: 6.5 g/dL (ref 6.5–8.1)

## 2024-04-26 MED ORDER — SODIUM CHLORIDE 0.9% FLUSH: 10.0000 mL | INTRAVENOUS | Status: DC | PRN

## 2024-04-26 MED ORDER — DENOSUMAB 60 MG/ML ~~LOC~~ SOSY
60.0000 mg | PREFILLED_SYRINGE | Freq: Once | SUBCUTANEOUS | Status: AC
  Administered 2024-04-26: 60 mg via SUBCUTANEOUS
  Filled 2024-04-26: qty 1

## 2024-04-26 MED ORDER — SODIUM CHLORIDE 0.9 % IV SOLN: Freq: Once | INTRAVENOUS | Status: AC

## 2024-04-26 MED ORDER — TRASTUZUMAB-ANNS CHEMO 150 MG IV SOLR
6.0000 mg/kg | Freq: Once | INTRAVENOUS | Status: AC
  Administered 2024-04-26: 378 mg via INTRAVENOUS
  Filled 2024-04-26: qty 18

## 2024-04-26 NOTE — Patient Instructions (Signed)
 Trastuzumab  Injection What is this medication? TRASTUZUMAB  (tras TOO zoo mab) treats breast cancer and stomach cancer. It works by blocking a protein that causes cancer cells to grow and multiply. This helps to slow or stop the spread of cancer cells. This medicine may be used for other purposes; ask your health care provider or pharmacist if you have questions. COMMON BRAND NAME(S): Herceptin , HERCESSI, Herzuma , KANJINTI , Ogivri , Ontruzant , Trazimera  What should I tell my care team before I take this medication? They need to know if you have any of these conditions: Heart failure Lung disease An unusual or allergic reaction to trastuzumab , other medications, foods, dyes, or preservatives Pregnant or trying to get pregnant Breast-feeding How should I use this medication? This medication is injected into a vein. It is given by your care team in a hospital or clinic setting. Talk to your care team about the use of this medication in children. It is not approved for use in children. Overdosage: If you think you have taken too much of this medicine contact a poison control center or emergency room at once. NOTE: This medicine is only for you. Do not share this medicine with others. What if I miss a dose? Keep appointments for follow-up doses. It is important not to miss your dose. Call your care team if you are unable to keep an appointment. What may interact with this medication? Certain types of chemotherapy, such as daunorubicin, doxorubicin, epirubicin, idarubicin This list may not describe all possible interactions. Give your health care provider a list of all the medicines, herbs, non-prescription drugs, or dietary supplements you use. Also tell them if you smoke, drink alcohol , or use illegal drugs. Some items may interact with your medicine. What should I watch for while using this medication? Your condition will be monitored carefully while you are receiving this medication. This  medication may make you feel generally unwell. This is not uncommon, as chemotherapy affects healthy cells as well as cancer cells. Report any side effects. Continue your course of treatment even though you feel ill unless your care team tells you to stop. This medication may increase your risk of getting an infection. Call your care team for advice if you get a fever, chills, sore throat, or other symptoms of a cold or flu. Do not treat yourself. Try to avoid being around people who are sick. Avoid taking medications that contain aspirin , acetaminophen , ibuprofen , naproxen, or ketoprofen unless instructed by your care team. These medications can hide a fever. Talk to your care team if you may be pregnant. Serious birth defects can occur if you take this medication during pregnancy and for 7 months after the last dose. You will need a negative pregnancy test before starting this medication. Contraception is recommended while taking this medication and for 7 months after the last dose. Your care team can help you find the option that works for you. Do not breastfeed while taking this medication and for 7 months after stopping treatment. What side effects may I notice from receiving this medication? Side effects that you should report to your care team as soon as possible: Allergic reactions or angioedema--skin rash, itching or hives, swelling of the face, eyes, lips, tongue, arms, or legs, trouble swallowing or breathing Dry cough, shortness of breath or trouble breathing Heart failure--shortness of breath, swelling of the ankles, feet, or hands, sudden weight gain, unusual weakness or fatigue Infection--fever, chills, cough, or sore throat Infusion reactions--chest pain, shortness of breath or trouble breathing, feeling faint  or lightheaded Side effects that usually do not require medical attention (report to your care team if they continue or are  bothersome): Diarrhea Dizziness Headache Nausea Trouble sleeping Vomiting This list may not describe all possible side effects. Call your doctor for medical advice about side effects. You may report side effects to FDA at 1-800-FDA-1088. Where should I keep my medication? This medication is given in a hospital or clinic. It will not be stored at home. NOTE: This sheet is a summary. It may not cover all possible information. If you have questions about this medicine, talk to your doctor, pharmacist, or health care provider.  2024 Elsevier/Gold Standard (2021-12-21 00:00:00)Denosumab  Injection (Osteoporosis) What is this medication? DENOSUMAB  (den oh SUE mab) prevents and treats osteoporosis. It works by Interior and spatial designer stronger and less likely to break (fracture). It is a monoclonal antibody. This medicine may be used for other purposes; ask your health care provider or pharmacist if you have questions. COMMON BRAND NAME(S): Prolia  What should I tell my care team before I take this medication? They need to know if you have any of these conditions: Dental or gum disease Had thyroid  or parathyroid (glands located in neck) surgery Having dental surgery or a tooth pulled Kidney disease Low levels of calcium  in the blood On dialysis Poor nutrition Thyroid  disease Trouble absorbing nutrients from your food An unusual or allergic reaction to denosumab , other medications, foods, dyes, or preservatives Pregnant or trying to get pregnant Breastfeeding How should I use this medication? This medication is injected under the skin. It is given by your care team in a hospital or clinic setting. A special MedGuide will be given to you before each treatment. Be sure to read this information carefully each time. Talk to your care team about the use of this medication in children. Special care may be needed. Overdosage: If you think you have taken too much of this medicine contact a poison control  center or emergency room at once. NOTE: This medicine is only for you. Do not share this medicine with others. What if I miss a dose? Keep appointments for follow-up doses. It is important not to miss your dose. Call your care team if you are unable to keep an appointment. What may interact with this medication? Do not take this medication with any of the following: Other medications that contain denosumab  This medication may also interact with the following: Medications that lower your chance of fighting infection Steroid medications, such as prednisone or cortisone This list may not describe all possible interactions. Give your health care provider a list of all the medicines, herbs, non-prescription drugs, or dietary supplements you use. Also tell them if you smoke, drink alcohol , or use illegal drugs. Some items may interact with your medicine. What should I watch for while using this medication? Your condition will be monitored carefully while you are receiving this medication. You may need blood work done while taking this medication. This medication may increase your risk of getting an infection. Call your care team for advice if you get a fever, chills, sore throat, or other symptoms of a cold or flu. Do not treat yourself. Try to avoid being around people who are sick. Tell your dentist and dental surgeon that you are taking this medication. You should not have major dental surgery while on this medication. See your dentist to have a dental exam and fix any dental problems before starting this medication. Take good care of your teeth while on  this medication. Make sure you see your dentist for regular follow-up appointments. This medication may cause low levels of calcium  in your body. The risk of severe side effects is increased in people with kidney disease. Your care team may prescribe calcium  and vitamin D  to help prevent low calcium  levels while you take this medication. It is important  to take calcium  and vitamin D  as directed by your care team. Talk to your care team if you may be pregnant. Serious birth defects may occur if you take this medication during pregnancy and for 5 months after the last dose. You will need a negative pregnancy test before starting this medication. Contraception is recommended while taking this medication and for 5 months after the last dose. Your care team can help you find the option that works for you. Talk to your care team before breastfeeding. Changes to your treatment plan may be needed. What side effects may I notice from receiving this medication? Side effects that you should report to your care team as soon as possible: Allergic reactions--skin rash, itching, hives, swelling of the face, lips, tongue, or throat Infection--fever, chills, cough, sore throat, wounds that don't heal, pain or trouble when passing urine, general feeling of discomfort or being unwell Low calcium  level--muscle pain or cramps, confusion, tingling, or numbness in the hands or feet Osteonecrosis of the jaw--pain, swelling, or redness in the mouth, numbness of the jaw, poor healing after dental work, unusual discharge from the mouth, visible bones in the mouth Severe bone, joint, or muscle pain Skin infection--skin redness, swelling, warmth, or pain Side effects that usually do not require medical attention (report these to your care team if they continue or are bothersome): Back pain Headache Joint pain Muscle pain Pain in the hands, arms, legs, or feet Runny or stuffy nose Sore throat This list may not describe all possible side effects. Call your doctor for medical advice about side effects. You may report side effects to FDA at 1-800-FDA-1088. Where should I keep my medication? This medication is given in a hospital or clinic. It will not be stored at home. NOTE: This sheet is a summary. It may not cover all possible information. If you have questions about this  medicine, talk to your doctor, pharmacist, or health care provider.  2024 Elsevier/Gold Standard (2022-09-13 00:00:00)

## 2024-04-29 DIAGNOSIS — R2689 Other abnormalities of gait and mobility: Secondary | ICD-10-CM | POA: Diagnosis not present

## 2024-04-29 DIAGNOSIS — M25612 Stiffness of left shoulder, not elsewhere classified: Secondary | ICD-10-CM | POA: Diagnosis not present

## 2024-04-29 DIAGNOSIS — M542 Cervicalgia: Secondary | ICD-10-CM | POA: Diagnosis not present

## 2024-04-29 DIAGNOSIS — M1611 Unilateral primary osteoarthritis, right hip: Secondary | ICD-10-CM | POA: Diagnosis not present

## 2024-04-29 DIAGNOSIS — M6281 Muscle weakness (generalized): Secondary | ICD-10-CM | POA: Diagnosis not present

## 2024-04-29 DIAGNOSIS — M25512 Pain in left shoulder: Secondary | ICD-10-CM | POA: Diagnosis not present

## 2024-04-30 ENCOUNTER — Other Ambulatory Visit (HOSPITAL_BASED_OUTPATIENT_CLINIC_OR_DEPARTMENT_OTHER): Payer: Self-pay

## 2024-04-30 MED ORDER — HYDROCODONE-ACETAMINOPHEN 10-325 MG PO TABS
1.0000 | ORAL_TABLET | ORAL | 0 refills | Status: DC | PRN
  Filled 2024-04-30: qty 150, 25d supply, fill #0

## 2024-05-01 DIAGNOSIS — R2689 Other abnormalities of gait and mobility: Secondary | ICD-10-CM | POA: Diagnosis not present

## 2024-05-01 DIAGNOSIS — M25512 Pain in left shoulder: Secondary | ICD-10-CM | POA: Diagnosis not present

## 2024-05-01 DIAGNOSIS — M1611 Unilateral primary osteoarthritis, right hip: Secondary | ICD-10-CM | POA: Diagnosis not present

## 2024-05-01 DIAGNOSIS — M542 Cervicalgia: Secondary | ICD-10-CM | POA: Diagnosis not present

## 2024-05-01 DIAGNOSIS — M6281 Muscle weakness (generalized): Secondary | ICD-10-CM | POA: Diagnosis not present

## 2024-05-01 DIAGNOSIS — M25612 Stiffness of left shoulder, not elsewhere classified: Secondary | ICD-10-CM | POA: Diagnosis not present

## 2024-05-06 DIAGNOSIS — R2689 Other abnormalities of gait and mobility: Secondary | ICD-10-CM | POA: Diagnosis not present

## 2024-05-06 DIAGNOSIS — M1611 Unilateral primary osteoarthritis, right hip: Secondary | ICD-10-CM | POA: Diagnosis not present

## 2024-05-06 DIAGNOSIS — M25612 Stiffness of left shoulder, not elsewhere classified: Secondary | ICD-10-CM | POA: Diagnosis not present

## 2024-05-06 DIAGNOSIS — M25512 Pain in left shoulder: Secondary | ICD-10-CM | POA: Diagnosis not present

## 2024-05-06 DIAGNOSIS — M542 Cervicalgia: Secondary | ICD-10-CM | POA: Diagnosis not present

## 2024-05-06 DIAGNOSIS — M6281 Muscle weakness (generalized): Secondary | ICD-10-CM | POA: Diagnosis not present

## 2024-05-08 DIAGNOSIS — M542 Cervicalgia: Secondary | ICD-10-CM | POA: Diagnosis not present

## 2024-05-08 DIAGNOSIS — M25612 Stiffness of left shoulder, not elsewhere classified: Secondary | ICD-10-CM | POA: Diagnosis not present

## 2024-05-08 DIAGNOSIS — M25512 Pain in left shoulder: Secondary | ICD-10-CM | POA: Diagnosis not present

## 2024-05-08 DIAGNOSIS — M1611 Unilateral primary osteoarthritis, right hip: Secondary | ICD-10-CM | POA: Diagnosis not present

## 2024-05-08 DIAGNOSIS — M6281 Muscle weakness (generalized): Secondary | ICD-10-CM | POA: Diagnosis not present

## 2024-05-08 DIAGNOSIS — R2689 Other abnormalities of gait and mobility: Secondary | ICD-10-CM | POA: Diagnosis not present

## 2024-05-10 DIAGNOSIS — Z981 Arthrodesis status: Secondary | ICD-10-CM | POA: Diagnosis not present

## 2024-05-10 DIAGNOSIS — M542 Cervicalgia: Secondary | ICD-10-CM | POA: Diagnosis not present

## 2024-05-14 DIAGNOSIS — M1611 Unilateral primary osteoarthritis, right hip: Secondary | ICD-10-CM | POA: Diagnosis not present

## 2024-05-14 DIAGNOSIS — S32401D Unspecified fracture of right acetabulum, subsequent encounter for fracture with routine healing: Secondary | ICD-10-CM | POA: Diagnosis not present

## 2024-05-15 DIAGNOSIS — Z859 Personal history of malignant neoplasm, unspecified: Secondary | ICD-10-CM | POA: Diagnosis not present

## 2024-05-15 DIAGNOSIS — E039 Hypothyroidism, unspecified: Secondary | ICD-10-CM | POA: Diagnosis not present

## 2024-05-15 DIAGNOSIS — M542 Cervicalgia: Secondary | ICD-10-CM | POA: Diagnosis not present

## 2024-05-15 DIAGNOSIS — R2689 Other abnormalities of gait and mobility: Secondary | ICD-10-CM | POA: Diagnosis not present

## 2024-05-15 DIAGNOSIS — I480 Paroxysmal atrial fibrillation: Secondary | ICD-10-CM | POA: Diagnosis not present

## 2024-05-15 DIAGNOSIS — M1611 Unilateral primary osteoarthritis, right hip: Secondary | ICD-10-CM | POA: Diagnosis not present

## 2024-05-15 DIAGNOSIS — M25519 Pain in unspecified shoulder: Secondary | ICD-10-CM | POA: Diagnosis not present

## 2024-05-15 DIAGNOSIS — E871 Hypo-osmolality and hyponatremia: Secondary | ICD-10-CM | POA: Diagnosis not present

## 2024-05-15 DIAGNOSIS — M6281 Muscle weakness (generalized): Secondary | ICD-10-CM | POA: Diagnosis not present

## 2024-05-15 DIAGNOSIS — Z6824 Body mass index (BMI) 24.0-24.9, adult: Secondary | ICD-10-CM | POA: Diagnosis not present

## 2024-05-15 DIAGNOSIS — I10 Essential (primary) hypertension: Secondary | ICD-10-CM | POA: Diagnosis not present

## 2024-05-15 DIAGNOSIS — M25612 Stiffness of left shoulder, not elsewhere classified: Secondary | ICD-10-CM | POA: Diagnosis not present

## 2024-05-15 DIAGNOSIS — M810 Age-related osteoporosis without current pathological fracture: Secondary | ICD-10-CM | POA: Diagnosis not present

## 2024-05-15 DIAGNOSIS — M25512 Pain in left shoulder: Secondary | ICD-10-CM | POA: Diagnosis not present

## 2024-05-17 ENCOUNTER — Inpatient Hospital Stay

## 2024-05-17 VITALS — BP 122/61 | HR 88 | Temp 98.0°F | Resp 18 | Wt 129.0 lb

## 2024-05-17 DIAGNOSIS — C787 Secondary malignant neoplasm of liver and intrahepatic bile duct: Secondary | ICD-10-CM | POA: Diagnosis not present

## 2024-05-17 DIAGNOSIS — Z79899 Other long term (current) drug therapy: Secondary | ICD-10-CM | POA: Diagnosis not present

## 2024-05-17 DIAGNOSIS — C50912 Malignant neoplasm of unspecified site of left female breast: Secondary | ICD-10-CM | POA: Diagnosis not present

## 2024-05-17 DIAGNOSIS — Z5112 Encounter for antineoplastic immunotherapy: Secondary | ICD-10-CM | POA: Diagnosis not present

## 2024-05-17 DIAGNOSIS — Z170 Estrogen receptor positive status [ER+]: Secondary | ICD-10-CM | POA: Diagnosis not present

## 2024-05-17 DIAGNOSIS — Z79620 Long term (current) use of immunosuppressive biologic: Secondary | ICD-10-CM | POA: Diagnosis not present

## 2024-05-17 MED ORDER — SODIUM CHLORIDE 0.9 % IV SOLN: Freq: Once | INTRAVENOUS | Status: DC

## 2024-05-17 MED ORDER — TRASTUZUMAB-ANNS CHEMO 150 MG IV SOLR
6.0000 mg/kg | Freq: Once | INTRAVENOUS | Status: AC
  Administered 2024-05-17: 378 mg via INTRAVENOUS
  Filled 2024-05-17: qty 18

## 2024-05-17 NOTE — Patient Instructions (Signed)
 Trastuzumab Injection What is this medication? TRASTUZUMAB (tras TOO zoo mab) treats breast cancer and stomach cancer. It works by blocking a protein that causes cancer cells to grow and multiply. This helps to slow or stop the spread of cancer cells. This medicine may be used for other purposes; ask your health care provider or pharmacist if you have questions. COMMON BRAND NAME(S): Herceptin, HERCESSI, Herzuma, KANJINTI, Ogivri, Ontruzant, Trazimera What should I tell my care team before I take this medication? They need to know if you have any of these conditions: Heart failure Lung disease An unusual or allergic reaction to trastuzumab, other medications, foods, dyes, or preservatives Pregnant or trying to get pregnant Breast-feeding How should I use this medication? This medication is injected into a vein. It is given by your care team in a hospital or clinic setting. Talk to your care team about the use of this medication in children. It is not approved for use in children. Overdosage: If you think you have taken too much of this medicine contact a poison control center or emergency room at once. NOTE: This medicine is only for you. Do not share this medicine with others. What if I miss a dose? Keep appointments for follow-up doses. It is important not to miss your dose. Call your care team if you are unable to keep an appointment. What may interact with this medication? Certain types of chemotherapy, such as daunorubicin, doxorubicin, epirubicin, idarubicin This list may not describe all possible interactions. Give your health care provider a list of all the medicines, herbs, non-prescription drugs, or dietary supplements you use. Also tell them if you smoke, drink alcohol, or use illegal drugs. Some items may interact with your medicine. What should I watch for while using this medication? Your condition will be monitored carefully while you are receiving this medication. This  medication may make you feel generally unwell. This is not uncommon, as chemotherapy affects healthy cells as well as cancer cells. Report any side effects. Continue your course of treatment even though you feel ill unless your care team tells you to stop. This medication may increase your risk of getting an infection. Call your care team for advice if you get a fever, chills, sore throat, or other symptoms of a cold or flu. Do not treat yourself. Try to avoid being around people who are sick. Avoid taking medications that contain aspirin, acetaminophen, ibuprofen, naproxen, or ketoprofen unless instructed by your care team. These medications can hide a fever. Talk to your care team if you may be pregnant. Serious birth defects can occur if you take this medication during pregnancy and for 7 months after the last dose. You will need a negative pregnancy test before starting this medication. Contraception is recommended while taking this medication and for 7 months after the last dose. Your care team can help you find the option that works for you. Do not breastfeed while taking this medication and for 7 months after stopping treatment. What side effects may I notice from receiving this medication? Side effects that you should report to your care team as soon as possible: Allergic reactions or angioedema--skin rash, itching or hives, swelling of the face, eyes, lips, tongue, arms, or legs, trouble swallowing or breathing Dry cough, shortness of breath or trouble breathing Heart failure--shortness of breath, swelling of the ankles, feet, or hands, sudden weight gain, unusual weakness or fatigue Infection--fever, chills, cough, or sore throat Infusion reactions--chest pain, shortness of breath or trouble breathing, feeling faint  or lightheaded Side effects that usually do not require medical attention (report to your care team if they continue or are  bothersome): Diarrhea Dizziness Headache Nausea Trouble sleeping Vomiting This list may not describe all possible side effects. Call your doctor for medical advice about side effects. You may report side effects to FDA at 1-800-FDA-1088. Where should I keep my medication? This medication is given in a hospital or clinic. It will not be stored at home. NOTE: This sheet is a summary. It may not cover all possible information. If you have questions about this medicine, talk to your doctor, pharmacist, or health care provider.  2024 Elsevier/Gold Standard (2021-12-21 00:00:00)

## 2024-05-21 DIAGNOSIS — M542 Cervicalgia: Secondary | ICD-10-CM | POA: Diagnosis not present

## 2024-05-21 DIAGNOSIS — M1611 Unilateral primary osteoarthritis, right hip: Secondary | ICD-10-CM | POA: Diagnosis not present

## 2024-05-21 DIAGNOSIS — R2689 Other abnormalities of gait and mobility: Secondary | ICD-10-CM | POA: Diagnosis not present

## 2024-05-21 DIAGNOSIS — M25512 Pain in left shoulder: Secondary | ICD-10-CM | POA: Diagnosis not present

## 2024-05-21 DIAGNOSIS — M25612 Stiffness of left shoulder, not elsewhere classified: Secondary | ICD-10-CM | POA: Diagnosis not present

## 2024-05-21 DIAGNOSIS — M6281 Muscle weakness (generalized): Secondary | ICD-10-CM | POA: Diagnosis not present

## 2024-05-27 ENCOUNTER — Other Ambulatory Visit (HOSPITAL_BASED_OUTPATIENT_CLINIC_OR_DEPARTMENT_OTHER): Payer: Self-pay

## 2024-05-30 ENCOUNTER — Other Ambulatory Visit (HOSPITAL_BASED_OUTPATIENT_CLINIC_OR_DEPARTMENT_OTHER): Payer: Self-pay

## 2024-05-30 MED ORDER — HYDROCODONE-ACETAMINOPHEN 10-325 MG PO TABS
1.0000 | ORAL_TABLET | ORAL | 0 refills | Status: DC | PRN
  Filled 2024-05-30: qty 150, 25d supply, fill #0

## 2024-06-07 ENCOUNTER — Inpatient Hospital Stay: Attending: Oncology

## 2024-06-07 VITALS — BP 95/62 | HR 89 | Temp 98.7°F | Resp 18 | Ht 61.0 in | Wt 137.0 lb

## 2024-06-07 DIAGNOSIS — C50912 Malignant neoplasm of unspecified site of left female breast: Secondary | ICD-10-CM | POA: Diagnosis not present

## 2024-06-07 DIAGNOSIS — Z23 Encounter for immunization: Secondary | ICD-10-CM | POA: Insufficient documentation

## 2024-06-07 DIAGNOSIS — Z79899 Other long term (current) drug therapy: Secondary | ICD-10-CM | POA: Insufficient documentation

## 2024-06-07 DIAGNOSIS — Z79620 Long term (current) use of immunosuppressive biologic: Secondary | ICD-10-CM | POA: Diagnosis not present

## 2024-06-07 DIAGNOSIS — Z5112 Encounter for antineoplastic immunotherapy: Secondary | ICD-10-CM | POA: Diagnosis not present

## 2024-06-07 DIAGNOSIS — Z170 Estrogen receptor positive status [ER+]: Secondary | ICD-10-CM | POA: Diagnosis not present

## 2024-06-07 MED ORDER — INFLUENZA VAC SPLIT HIGH-DOSE 0.5 ML IM SUSY
0.5000 mL | PREFILLED_SYRINGE | Freq: Once | INTRAMUSCULAR | Status: AC
  Administered 2024-06-07: 0.5 mL via INTRAMUSCULAR
  Filled 2024-06-07: qty 0.5

## 2024-06-07 MED ORDER — SODIUM CHLORIDE 0.9 % IV SOLN: Freq: Once | INTRAVENOUS | Status: AC

## 2024-06-07 MED ORDER — TRASTUZUMAB-ANNS CHEMO 150 MG IV SOLR
6.0000 mg/kg | Freq: Once | INTRAVENOUS | Status: AC
  Administered 2024-06-07: 378 mg via INTRAVENOUS
  Filled 2024-06-07: qty 18

## 2024-06-07 NOTE — Patient Instructions (Signed)
 Trastuzumab  Injection What is this medication? TRASTUZUMAB  (tras TOO zoo mab) treats breast cancer and stomach cancer. It works by blocking a protein that causes cancer cells to grow and multiply. This helps to slow or stop the spread of cancer cells. This medicine may be used for other purposes; ask your health care provider or pharmacist if you have questions. COMMON BRAND NAME(S): Herceptin , HERCESSI, Herzuma , KANJINTI , Ogivri , Ontruzant , Trazimera  What should I tell my care team before I take this medication? They need to know if you have any of these conditions: Heart failure Lung disease An unusual or allergic reaction to trastuzumab , other medications, foods, dyes, or preservatives Pregnant or trying to get pregnant Breast-feeding How should I use this medication? This medication is injected into a vein. It is given by your care team in a hospital or clinic setting. Talk to your care team about the use of this medication in children. It is not approved for use in children. Overdosage: If you think you have taken too much of this medicine contact a poison control center or emergency room at once. NOTE: This medicine is only for you. Do not share this medicine with others. What if I miss a dose? Keep appointments for follow-up doses. It is important not to miss your dose. Call your care team if you are unable to keep an appointment. What may interact with this medication? Certain types of chemotherapy, such as daunorubicin, doxorubicin, epirubicin, idarubicin This list may not describe all possible interactions. Give your health care provider a list of all the medicines, herbs, non-prescription drugs, or dietary supplements you use. Also tell them if you smoke, drink alcohol , or use illegal drugs. Some items may interact with your medicine. What should I watch for while using this medication? Your condition will be monitored carefully while you are receiving this medication. This  medication may make you feel generally unwell. This is not uncommon, as chemotherapy affects healthy cells as well as cancer cells. Report any side effects. Continue your course of treatment even though you feel ill unless your care team tells you to stop. This medication may increase your risk of getting an infection. Call your care team for advice if you get a fever, chills, sore throat, or other symptoms of a cold or flu. Do not treat yourself. Try to avoid being around people who are sick. Avoid taking medications that contain aspirin , acetaminophen , ibuprofen , naproxen, or ketoprofen unless instructed by your care team. These medications can hide a fever. Talk to your care team if you may be pregnant. Serious birth defects can occur if you take this medication during pregnancy and for 7 months after the last dose. You will need a negative pregnancy test before starting this medication. Contraception is recommended while taking this medication and for 7 months after the last dose. Your care team can help you find the option that works for you. Do not breastfeed while taking this medication and for 7 months after stopping treatment. What side effects may I notice from receiving this medication? Side effects that you should report to your care team as soon as possible: Allergic reactions or angioedema--skin rash, itching or hives, swelling of the face, eyes, lips, tongue, arms, or legs, trouble swallowing or breathing Dry cough, shortness of breath or trouble breathing Heart failure--shortness of breath, swelling of the ankles, feet, or hands, sudden weight gain, unusual weakness or fatigue Infection--fever, chills, cough, or sore throat Infusion reactions--chest pain, shortness of breath or trouble breathing, feeling faint  or lightheaded Side effects that usually do not require medical attention (report to your care team if they continue or are  bothersome): Diarrhea Dizziness Headache Nausea Trouble sleeping Vomiting This list may not describe all possible side effects. Call your doctor for medical advice about side effects. You may report side effects to FDA at 1-800-FDA-1088. Where should I keep my medication? This medication is given in a hospital or clinic. It will not be stored at home. NOTE: This sheet is a summary. It may not cover all possible information. If you have questions about this medicine, talk to your doctor, pharmacist, or health care provider.  2024 Elsevier/Gold Standard (2021-12-21 00:00:00)Denosumab  Injection (Osteoporosis) What is this medication? DENOSUMAB  (den oh SUE mab) prevents and treats osteoporosis. It works by Interior and spatial designer stronger and less likely to break (fracture). It is a monoclonal antibody. This medicine may be used for other purposes; ask your health care provider or pharmacist if you have questions. COMMON BRAND NAME(S): Prolia  What should I tell my care team before I take this medication? They need to know if you have any of these conditions: Dental or gum disease Had thyroid  or parathyroid (glands located in neck) surgery Having dental surgery or a tooth pulled Kidney disease Low levels of calcium  in the blood On dialysis Poor nutrition Thyroid  disease Trouble absorbing nutrients from your food An unusual or allergic reaction to denosumab , other medications, foods, dyes, or preservatives Pregnant or trying to get pregnant Breastfeeding How should I use this medication? This medication is injected under the skin. It is given by your care team in a hospital or clinic setting. A special MedGuide will be given to you before each treatment. Be sure to read this information carefully each time. Talk to your care team about the use of this medication in children. Special care may be needed. Overdosage: If you think you have taken too much of this medicine contact a poison control  center or emergency room at once. NOTE: This medicine is only for you. Do not share this medicine with others. What if I miss a dose? Keep appointments for follow-up doses. It is important not to miss your dose. Call your care team if you are unable to keep an appointment. What may interact with this medication? Do not take this medication with any of the following: Other medications that contain denosumab  This medication may also interact with the following: Medications that lower your chance of fighting infection Steroid medications, such as prednisone or cortisone This list may not describe all possible interactions. Give your health care provider a list of all the medicines, herbs, non-prescription drugs, or dietary supplements you use. Also tell them if you smoke, drink alcohol , or use illegal drugs. Some items may interact with your medicine. What should I watch for while using this medication? Your condition will be monitored carefully while you are receiving this medication. You may need blood work done while taking this medication. This medication may increase your risk of getting an infection. Call your care team for advice if you get a fever, chills, sore throat, or other symptoms of a cold or flu. Do not treat yourself. Try to avoid being around people who are sick. Tell your dentist and dental surgeon that you are taking this medication. You should not have major dental surgery while on this medication. See your dentist to have a dental exam and fix any dental problems before starting this medication. Take good care of your teeth while on  this medication. Make sure you see your dentist for regular follow-up appointments. This medication may cause low levels of calcium  in your body. The risk of severe side effects is increased in people with kidney disease. Your care team may prescribe calcium  and vitamin D  to help prevent low calcium  levels while you take this medication. It is important  to take calcium  and vitamin D  as directed by your care team. Talk to your care team if you may be pregnant. Serious birth defects may occur if you take this medication during pregnancy and for 5 months after the last dose. You will need a negative pregnancy test before starting this medication. Contraception is recommended while taking this medication and for 5 months after the last dose. Your care team can help you find the option that works for you. Talk to your care team before breastfeeding. Changes to your treatment plan may be needed. What side effects may I notice from receiving this medication? Side effects that you should report to your care team as soon as possible: Allergic reactions--skin rash, itching, hives, swelling of the face, lips, tongue, or throat Infection--fever, chills, cough, sore throat, wounds that don't heal, pain or trouble when passing urine, general feeling of discomfort or being unwell Low calcium  level--muscle pain or cramps, confusion, tingling, or numbness in the hands or feet Osteonecrosis of the jaw--pain, swelling, or redness in the mouth, numbness of the jaw, poor healing after dental work, unusual discharge from the mouth, visible bones in the mouth Severe bone, joint, or muscle pain Skin infection--skin redness, swelling, warmth, or pain Side effects that usually do not require medical attention (report these to your care team if they continue or are bothersome): Back pain Headache Joint pain Muscle pain Pain in the hands, arms, legs, or feet Runny or stuffy nose Sore throat This list may not describe all possible side effects. Call your doctor for medical advice about side effects. You may report side effects to FDA at 1-800-FDA-1088. Where should I keep my medication? This medication is given in a hospital or clinic. It will not be stored at home. NOTE: This sheet is a summary. It may not cover all possible information. If you have questions about this  medicine, talk to your doctor, pharmacist, or health care provider.  2024 Elsevier/Gold Standard (2022-09-13 00:00:00)

## 2024-06-13 ENCOUNTER — Other Ambulatory Visit: Payer: Self-pay | Admitting: Cardiology

## 2024-06-13 DIAGNOSIS — I4811 Longstanding persistent atrial fibrillation: Secondary | ICD-10-CM

## 2024-06-14 MED ORDER — APIXABAN 2.5 MG PO TABS: 2.5000 mg | ORAL_TABLET | Freq: Two times a day (BID) | ORAL | 1 refills | Status: DC

## 2024-06-14 NOTE — Telephone Encounter (Signed)
 Refill request

## 2024-06-14 NOTE — Telephone Encounter (Signed)
 Eliquis  5mg  refill request received. Patient is 78 years old, weight-62.1kg, Crea-0.69 on 04/26/24, Diagnosis-Afib, and last seen by Dr. Monetta on 04/17/24.  Per 09/26/23 OV note it states Continue Eliquis  2.5 mg twice daily-as she is on reduced dose secondary to her primary cardiologist and her shared decision making secondary to frailty and propensity to falls. Eliquis  2.5mg  twice a day refill sent.

## 2024-06-18 ENCOUNTER — Telehealth: Payer: Self-pay | Admitting: *Deleted

## 2024-06-18 MED ORDER — APIXABAN 5 MG PO TABS: 5.0000 mg | ORAL_TABLET | Freq: Two times a day (BID) | ORAL | 2 refills | Status: DC

## 2024-06-18 NOTE — Telephone Encounter (Signed)
 Pt needs the 5 mg of Eliquis  to take 1/2 tab twice daily. Someone sent in the 2.5 mg tablet. Received fax form [facility] requesting original Rx.

## 2024-06-20 ENCOUNTER — Other Ambulatory Visit (HOSPITAL_BASED_OUTPATIENT_CLINIC_OR_DEPARTMENT_OTHER): Payer: Self-pay

## 2024-06-20 ENCOUNTER — Encounter: Payer: Self-pay | Admitting: Cardiology

## 2024-06-24 ENCOUNTER — Other Ambulatory Visit (HOSPITAL_BASED_OUTPATIENT_CLINIC_OR_DEPARTMENT_OTHER): Payer: Self-pay

## 2024-06-24 ENCOUNTER — Telehealth: Payer: Self-pay | Admitting: Cardiology

## 2024-06-24 ENCOUNTER — Encounter: Payer: Self-pay | Admitting: Radiology

## 2024-06-24 NOTE — Telephone Encounter (Signed)
*  STAT* If patient is at the pharmacy, call can be transferred to refill team.   1. Which medications need to be refilled? (please list name of each medication and dose if known)  pixaban (ELIQUIS ) 5 MG TABS tablet   2. Which pharmacy/location (including street and city if local pharmacy) is medication to be sent to?  [facility] - [place], [place] - [address]    3. Do they need a 30 day or 90 day supply? 90   Pharmacy states they didn't get the prescription

## 2024-06-25 MED ORDER — APIXABAN 5 MG PO TABS: 5.0000 mg | ORAL_TABLET | Freq: Every day | ORAL | 3 refills | Status: DC

## 2024-06-25 NOTE — Telephone Encounter (Signed)
 Prescription refill request for Eliquis  received. Indication: A. FIB Last office visit: 04/17/24 Scr: 0.59 (EPIC 12/23/23) Age: 78 Weight: 62.1 kg  Patient meets protocol to continue at current dose. Patient is taking differently. I tablet by mouth daily. Will send to Dr. Monetta to see if this is okay.

## 2024-06-25 NOTE — Telephone Encounter (Signed)
Pt's medication has already been sent to pt's pharmacy. Confirmation received.

## 2024-06-27 ENCOUNTER — Other Ambulatory Visit (HOSPITAL_BASED_OUTPATIENT_CLINIC_OR_DEPARTMENT_OTHER): Payer: Self-pay

## 2024-06-27 MED ORDER — APIXABAN 5 MG PO TABS: 5.0000 mg | ORAL_TABLET | Freq: Every day | ORAL | 3 refills | Status: AC

## 2024-06-27 MED ORDER — HYDROCODONE-ACETAMINOPHEN 10-325 MG PO TABS
1.0000 | ORAL_TABLET | ORAL | 0 refills | Status: DC | PRN
  Filled 2024-06-27: qty 150, 25d supply, fill #0

## 2024-06-27 NOTE — Addendum Note (Signed)
 Addended by: ONEITA BERLINER on: 06/27/2024 07:03 AM   Modules accepted: Orders

## 2024-06-28 ENCOUNTER — Inpatient Hospital Stay: Attending: Oncology

## 2024-06-28 ENCOUNTER — Encounter: Payer: Self-pay | Admitting: Oncology

## 2024-06-28 VITALS — BP 135/69 | HR 76 | Temp 97.9°F | Resp 18

## 2024-06-28 DIAGNOSIS — Z79899 Other long term (current) drug therapy: Secondary | ICD-10-CM | POA: Insufficient documentation

## 2024-06-28 DIAGNOSIS — Z5112 Encounter for antineoplastic immunotherapy: Secondary | ICD-10-CM | POA: Insufficient documentation

## 2024-06-28 DIAGNOSIS — C50912 Malignant neoplasm of unspecified site of left female breast: Secondary | ICD-10-CM | POA: Insufficient documentation

## 2024-06-28 DIAGNOSIS — Z79620 Long term (current) use of immunosuppressive biologic: Secondary | ICD-10-CM | POA: Diagnosis not present

## 2024-06-28 DIAGNOSIS — Z170 Estrogen receptor positive status [ER+]: Secondary | ICD-10-CM | POA: Insufficient documentation

## 2024-06-28 MED ORDER — TRASTUZUMAB-ANNS CHEMO 150 MG IV SOLR
6.0000 mg/kg | Freq: Once | INTRAVENOUS | Status: AC
  Administered 2024-06-28: 378 mg via INTRAVENOUS
  Filled 2024-06-28: qty 18

## 2024-06-28 MED ORDER — SODIUM CHLORIDE 0.9 % IV SOLN: Freq: Once | INTRAVENOUS | Status: AC

## 2024-06-28 NOTE — Patient Instructions (Signed)
 Trastuzumab Injection What is this medication? TRASTUZUMAB (tras TOO zoo mab) treats breast cancer and stomach cancer. It works by blocking a protein that causes cancer cells to grow and multiply. This helps to slow or stop the spread of cancer cells. This medicine may be used for other purposes; ask your health care provider or pharmacist if you have questions. COMMON BRAND NAME(S): Herceptin, HERCESSI, Herzuma, KANJINTI, Ogivri, Ontruzant, Trazimera What should I tell my care team before I take this medication? They need to know if you have any of these conditions: Heart failure Lung disease An unusual or allergic reaction to trastuzumab, other medications, foods, dyes, or preservatives Pregnant or trying to get pregnant Breast-feeding How should I use this medication? This medication is injected into a vein. It is given by your care team in a hospital or clinic setting. Talk to your care team about the use of this medication in children. It is not approved for use in children. Overdosage: If you think you have taken too much of this medicine contact a poison control center or emergency room at once. NOTE: This medicine is only for you. Do not share this medicine with others. What if I miss a dose? Keep appointments for follow-up doses. It is important not to miss your dose. Call your care team if you are unable to keep an appointment. What may interact with this medication? Certain types of chemotherapy, such as daunorubicin, doxorubicin, epirubicin, idarubicin This list may not describe all possible interactions. Give your health care provider a list of all the medicines, herbs, non-prescription drugs, or dietary supplements you use. Also tell them if you smoke, drink alcohol, or use illegal drugs. Some items may interact with your medicine. What should I watch for while using this medication? Your condition will be monitored carefully while you are receiving this medication. This  medication may make you feel generally unwell. This is not uncommon, as chemotherapy affects healthy cells as well as cancer cells. Report any side effects. Continue your course of treatment even though you feel ill unless your care team tells you to stop. This medication may increase your risk of getting an infection. Call your care team for advice if you get a fever, chills, sore throat, or other symptoms of a cold or flu. Do not treat yourself. Try to avoid being around people who are sick. Avoid taking medications that contain aspirin, acetaminophen, ibuprofen, naproxen, or ketoprofen unless instructed by your care team. These medications can hide a fever. Talk to your care team if you may be pregnant. Serious birth defects can occur if you take this medication during pregnancy and for 7 months after the last dose. You will need a negative pregnancy test before starting this medication. Contraception is recommended while taking this medication and for 7 months after the last dose. Your care team can help you find the option that works for you. Do not breastfeed while taking this medication and for 7 months after stopping treatment. What side effects may I notice from receiving this medication? Side effects that you should report to your care team as soon as possible: Allergic reactions or angioedema--skin rash, itching or hives, swelling of the face, eyes, lips, tongue, arms, or legs, trouble swallowing or breathing Dry cough, shortness of breath or trouble breathing Heart failure--shortness of breath, swelling of the ankles, feet, or hands, sudden weight gain, unusual weakness or fatigue Infection--fever, chills, cough, or sore throat Infusion reactions--chest pain, shortness of breath or trouble breathing, feeling faint  or lightheaded Side effects that usually do not require medical attention (report to your care team if they continue or are  bothersome): Diarrhea Dizziness Headache Nausea Trouble sleeping Vomiting This list may not describe all possible side effects. Call your doctor for medical advice about side effects. You may report side effects to FDA at 1-800-FDA-1088. Where should I keep my medication? This medication is given in a hospital or clinic. It will not be stored at home. NOTE: This sheet is a summary. It may not cover all possible information. If you have questions about this medicine, talk to your doctor, pharmacist, or health care provider.  2024 Elsevier/Gold Standard (2021-12-21 00:00:00)

## 2024-06-29 ENCOUNTER — Other Ambulatory Visit: Payer: Self-pay | Admitting: Cardiology

## 2024-07-03 ENCOUNTER — Other Ambulatory Visit: Payer: Self-pay

## 2024-07-11 ENCOUNTER — Other Ambulatory Visit: Payer: Self-pay

## 2024-07-19 ENCOUNTER — Ambulatory Visit

## 2024-07-22 ENCOUNTER — Inpatient Hospital Stay: Attending: Oncology

## 2024-07-22 ENCOUNTER — Encounter: Payer: Self-pay | Admitting: Oncology

## 2024-07-22 ENCOUNTER — Other Ambulatory Visit (HOSPITAL_BASED_OUTPATIENT_CLINIC_OR_DEPARTMENT_OTHER): Payer: Self-pay

## 2024-07-22 VITALS — BP 141/72 | HR 71 | Temp 98.5°F | Resp 18

## 2024-07-22 DIAGNOSIS — Z79899 Other long term (current) drug therapy: Secondary | ICD-10-CM | POA: Diagnosis not present

## 2024-07-22 DIAGNOSIS — D509 Iron deficiency anemia, unspecified: Secondary | ICD-10-CM | POA: Diagnosis not present

## 2024-07-22 DIAGNOSIS — C50912 Malignant neoplasm of unspecified site of left female breast: Secondary | ICD-10-CM | POA: Diagnosis present

## 2024-07-22 DIAGNOSIS — Z79620 Long term (current) use of immunosuppressive biologic: Secondary | ICD-10-CM | POA: Diagnosis not present

## 2024-07-22 DIAGNOSIS — Z5112 Encounter for antineoplastic immunotherapy: Secondary | ICD-10-CM | POA: Diagnosis present

## 2024-07-22 DIAGNOSIS — Z170 Estrogen receptor positive status [ER+]: Secondary | ICD-10-CM | POA: Diagnosis not present

## 2024-07-22 DIAGNOSIS — I10 Essential (primary) hypertension: Secondary | ICD-10-CM | POA: Insufficient documentation

## 2024-07-22 MED ORDER — TRASTUZUMAB-ANNS CHEMO 150 MG IV SOLR
6.0000 mg/kg | Freq: Once | INTRAVENOUS | Status: AC
  Administered 2024-07-22: 378 mg via INTRAVENOUS
  Filled 2024-07-22: qty 18

## 2024-07-22 MED ORDER — SODIUM CHLORIDE 0.9 % IV SOLN: Freq: Once | INTRAVENOUS | Status: AC

## 2024-07-23 DIAGNOSIS — M1611 Unilateral primary osteoarthritis, right hip: Secondary | ICD-10-CM | POA: Diagnosis not present

## 2024-07-23 DIAGNOSIS — S32401D Unspecified fracture of right acetabulum, subsequent encounter for fracture with routine healing: Secondary | ICD-10-CM | POA: Diagnosis not present

## 2024-07-24 DIAGNOSIS — L209 Atopic dermatitis, unspecified: Secondary | ICD-10-CM | POA: Diagnosis not present

## 2024-07-24 DIAGNOSIS — L719 Rosacea, unspecified: Secondary | ICD-10-CM | POA: Diagnosis not present

## 2024-07-25 ENCOUNTER — Other Ambulatory Visit (HOSPITAL_BASED_OUTPATIENT_CLINIC_OR_DEPARTMENT_OTHER): Payer: Self-pay

## 2024-07-25 MED ORDER — HYDROCODONE-ACETAMINOPHEN 10-325 MG PO TABS
1.0000 | ORAL_TABLET | ORAL | 0 refills | Status: DC | PRN
  Filled 2024-07-25: qty 150, 25d supply, fill #0

## 2024-07-25 MED ORDER — DIAZEPAM 10 MG PO TABS
10.0000 mg | ORAL_TABLET | Freq: Every evening | ORAL | 5 refills | Status: AC | PRN
  Filled 2024-07-25: qty 30, 30d supply, fill #0
  Filled 2024-08-23: qty 30, 30d supply, fill #1

## 2024-07-26 ENCOUNTER — Other Ambulatory Visit (HOSPITAL_COMMUNITY): Payer: Self-pay

## 2024-07-26 ENCOUNTER — Other Ambulatory Visit (HOSPITAL_BASED_OUTPATIENT_CLINIC_OR_DEPARTMENT_OTHER): Payer: Self-pay

## 2024-07-26 MED ORDER — DIAZEPAM 10 MG PO TABS
10.0000 mg | ORAL_TABLET | Freq: Every evening | ORAL | 5 refills | Status: AC | PRN
  Filled 2024-09-26: qty 30, 30d supply, fill #0

## 2024-07-30 ENCOUNTER — Encounter (HOSPITAL_BASED_OUTPATIENT_CLINIC_OR_DEPARTMENT_OTHER): Payer: Self-pay | Admitting: Student

## 2024-08-01 ENCOUNTER — Encounter: Payer: Self-pay | Admitting: Student

## 2024-08-01 ENCOUNTER — Other Ambulatory Visit (HOSPITAL_BASED_OUTPATIENT_CLINIC_OR_DEPARTMENT_OTHER): Payer: Self-pay | Admitting: Student

## 2024-08-01 DIAGNOSIS — M25551 Pain in right hip: Secondary | ICD-10-CM

## 2024-08-02 ENCOUNTER — Ambulatory Visit (INDEPENDENT_AMBULATORY_CARE_PROVIDER_SITE_OTHER)
Admission: RE | Admit: 2024-08-02 | Discharge: 2024-08-02 | Disposition: A | Source: Ambulatory Visit | Attending: Student | Admitting: Student

## 2024-08-02 ENCOUNTER — Other Ambulatory Visit: Payer: Self-pay | Admitting: Oncology

## 2024-08-02 DIAGNOSIS — M25551 Pain in right hip: Secondary | ICD-10-CM

## 2024-08-02 DIAGNOSIS — C50912 Malignant neoplasm of unspecified site of left female breast: Secondary | ICD-10-CM

## 2024-08-02 DIAGNOSIS — Z96641 Presence of right artificial hip joint: Secondary | ICD-10-CM | POA: Diagnosis not present

## 2024-08-07 ENCOUNTER — Other Ambulatory Visit: Payer: Self-pay

## 2024-08-09 ENCOUNTER — Inpatient Hospital Stay

## 2024-08-09 VITALS — BP 160/81 | HR 74 | Temp 97.7°F | Resp 18 | Ht 61.0 in | Wt 140.0 lb

## 2024-08-09 DIAGNOSIS — C50912 Malignant neoplasm of unspecified site of left female breast: Secondary | ICD-10-CM

## 2024-08-09 DIAGNOSIS — Z5112 Encounter for antineoplastic immunotherapy: Secondary | ICD-10-CM | POA: Diagnosis not present

## 2024-08-09 MED ORDER — TRASTUZUMAB-ANNS CHEMO 150 MG IV SOLR
6.0000 mg/kg | Freq: Once | INTRAVENOUS | Status: AC
  Administered 2024-08-09: 378 mg via INTRAVENOUS
  Filled 2024-08-09: qty 18

## 2024-08-09 MED ORDER — SODIUM CHLORIDE 0.9 % IV SOLN: Freq: Once | INTRAVENOUS | Status: AC

## 2024-08-09 NOTE — Patient Instructions (Signed)
 Trastuzumab  Injection What is this medication? TRASTUZUMAB  (tras TOO zoo mab) treats breast cancer and stomach cancer. It works by blocking a protein that causes cancer cells to grow and multiply. This helps to slow or stop the spread of cancer cells. This medicine may be used for other purposes; ask your health care provider or pharmacist if you have questions. COMMON BRAND NAME(S): Herceptin , HERCESSI, Herzuma , KANJINTI , Ogivri , Ontruzant , Trazimera  What should I tell my care team before I take this medication? They need to know if you have any of these conditions: Heart failure Lung disease An unusual or allergic reaction to trastuzumab , other medications, foods, dyes, or preservatives Pregnant or trying to get pregnant Breast-feeding How should I use this medication? This medication is injected into a vein. It is given by your care team in a hospital or clinic setting. Talk to your care team about the use of this medication in children. It is not approved for use in children. Overdosage: If you think you have taken too much of this medicine contact a poison control center or emergency room at once. NOTE: This medicine is only for you. Do not share this medicine with others. What if I miss a dose? Keep appointments for follow-up doses. It is important not to miss your dose. Call your care team if you are unable to keep an appointment. What may interact with this medication? Certain types of chemotherapy, such as daunorubicin, doxorubicin, epirubicin, idarubicin This list may not describe all possible interactions. Give your health care provider a list of all the medicines, herbs, non-prescription drugs, or dietary supplements you use. Also tell them if you smoke, drink alcohol , or use illegal drugs. Some items may interact with your medicine. What should I watch for while using this medication? Your condition will be monitored carefully while you are receiving this medication. This  medication may make you feel generally unwell. This is not uncommon, as chemotherapy affects healthy cells as well as cancer cells. Report any side effects. Continue your course of treatment even though you feel ill unless your care team tells you to stop. This medication may increase your risk of getting an infection. Call your care team for advice if you get a fever, chills, sore throat, or other symptoms of a cold or flu. Do not treat yourself. Try to avoid being around people who are sick. Avoid taking medications that contain aspirin , acetaminophen , ibuprofen , naproxen, or ketoprofen unless instructed by your care team. These medications can hide a fever. Talk to your care team if you may be pregnant. Serious birth defects can occur if you take this medication during pregnancy and for 7 months after the last dose. You will need a negative pregnancy test before starting this medication. Contraception is recommended while taking this medication and for 7 months after the last dose. Your care team can help you find the option that works for you. Do not breastfeed while taking this medication and for 7 months after stopping treatment. What side effects may I notice from receiving this medication? Side effects that you should report to your care team as soon as possible: Allergic reactions or angioedema--skin rash, itching or hives, swelling of the face, eyes, lips, tongue, arms, or legs, trouble swallowing or breathing Dry cough, shortness of breath or trouble breathing Heart failure--shortness of breath, swelling of the ankles, feet, or hands, sudden weight gain, unusual weakness or fatigue Infection--fever, chills, cough, or sore throat Infusion reactions--chest pain, shortness of breath or trouble breathing, feeling faint  or lightheaded Side effects that usually do not require medical attention (report to your care team if they continue or are  bothersome): Diarrhea Dizziness Headache Nausea Trouble sleeping Vomiting This list may not describe all possible side effects. Call your doctor for medical advice about side effects. You may report side effects to FDA at 1-800-FDA-1088. Where should I keep my medication? This medication is given in a hospital or clinic. It will not be stored at home. NOTE: This sheet is a summary. It may not cover all possible information. If you have questions about this medicine, talk to your doctor, pharmacist, or health care provider.  2024 Elsevier/Gold Standard (2021-12-21 00:00:00) [facility] [place] - A DEPT OF [facility]  Discharge Instructions: Thank you for choosing [facility] to provide your oncology and hematology care.  If you have a lab appointment with the Cancer Center, please go directly to the Cancer Center and check in at the registration area.   Wear comfortable clothing and clothing appropriate for easy access to any Portacath or PICC line.   We strive to give you quality time with your provider. You may need to reschedule your appointment if you arrive late (15 or more minutes).  Arriving late affects you and other patients whose appointments are after yours.  Also, if you miss three or more appointments without notifying the office, you may be dismissed from the clinic at the provider's discretion.      For prescription refill requests, have your pharmacy contact our office and allow 72 hours for refills to be completed.    Today you received the following chemotherapy and/or immunotherapy agents TRASTUZUMAB       To help prevent nausea and vomiting after your treatment, we encourage you to take your nausea medication as directed.  BELOW ARE SYMPTOMS THAT SHOULD BE REPORTED IMMEDIATELY: *FEVER GREATER THAN 100.4 F (38 C) OR HIGHER *CHILLS OR SWEATING *NAUSEA AND VOMITING THAT IS NOT CONTROLLED WITH YOUR NAUSEA MEDICATION *UNUSUAL  SHORTNESS OF BREATH *UNUSUAL BRUISING OR BLEEDING *URINARY PROBLEMS (pain or burning when urinating, or frequent urination) *BOWEL PROBLEMS (unusual diarrhea, constipation, pain near the anus) TENDERNESS IN MOUTH AND THROAT WITH OR WITHOUT PRESENCE OF ULCERS (sore throat, sores in mouth, or a toothache) UNUSUAL RASH, SWELLING OR PAIN  UNUSUAL VAGINAL DISCHARGE OR ITCHING   Items with * indicate a potential emergency and should be followed up as soon as possible or go to the Emergency Department if any problems should occur.  Please show the CHEMOTHERAPY ALERT CARD or IMMUNOTHERAPY ALERT CARD at check-in to the Emergency Department and triage nurse.  Should you have questions after your visit or need to cancel or reschedule your appointment, please contact [facility] [place] - A DEPT OF [facility]  Dept: [phone]  and follow the prompts.  Office hours are 8:00 a.m. to 4:30 p.m. Monday - Friday. Please note that voicemails left after 4:00 p.m. may not be returned until the following business day.  We are closed weekends and major holidays. You have access to a nurse at all times for urgent questions. Please call the main number to the clinic Dept: [phone] and follow the prompts.  For any non-urgent questions, you may also contact your provider using MyChart. We now offer e-Visits for anyone 6 and older to request care online for non-urgent symptoms. For details visit [email or web address].   Also download the MyChart app! Go to the app store, search MyChart,  open the app, select [place], and log in with your MyChart username and password.

## 2024-08-11 ENCOUNTER — Encounter: Payer: Self-pay | Admitting: Oncology

## 2024-08-12 ENCOUNTER — Other Ambulatory Visit: Payer: Self-pay | Admitting: Oncology

## 2024-08-12 DIAGNOSIS — C50912 Malignant neoplasm of unspecified site of left female breast: Secondary | ICD-10-CM

## 2024-08-20 ENCOUNTER — Other Ambulatory Visit (HOSPITAL_BASED_OUTPATIENT_CLINIC_OR_DEPARTMENT_OTHER): Payer: Self-pay

## 2024-08-20 MED ORDER — HYDROCODONE-ACETAMINOPHEN 10-325 MG PO TABS
1.0000 | ORAL_TABLET | ORAL | 0 refills | Status: AC | PRN
  Filled 2024-08-20 (×2): qty 150, 25d supply, fill #0

## 2024-08-23 ENCOUNTER — Other Ambulatory Visit (HOSPITAL_BASED_OUTPATIENT_CLINIC_OR_DEPARTMENT_OTHER): Payer: Self-pay

## 2024-08-26 ENCOUNTER — Other Ambulatory Visit (HOSPITAL_BASED_OUTPATIENT_CLINIC_OR_DEPARTMENT_OTHER): Payer: Self-pay

## 2024-08-29 ENCOUNTER — Inpatient Hospital Stay: Attending: Oncology

## 2024-08-29 ENCOUNTER — Telehealth: Payer: Self-pay | Admitting: Oncology

## 2024-08-29 ENCOUNTER — Inpatient Hospital Stay

## 2024-08-29 ENCOUNTER — Other Ambulatory Visit: Payer: Self-pay | Admitting: Oncology

## 2024-08-29 ENCOUNTER — Inpatient Hospital Stay: Attending: Oncology | Admitting: Oncology

## 2024-08-29 ENCOUNTER — Encounter: Payer: Self-pay | Admitting: Oncology

## 2024-08-29 VITALS — BP 130/60 | HR 85 | Temp 98.3°F | Resp 18 | Ht 61.0 in | Wt 138.2 lb

## 2024-08-29 VITALS — BP 133/77 | HR 111 | Resp 18

## 2024-08-29 DIAGNOSIS — M8000XG Age-related osteoporosis with current pathological fracture, unspecified site, subsequent encounter for fracture with delayed healing: Secondary | ICD-10-CM | POA: Diagnosis not present

## 2024-08-29 DIAGNOSIS — B379 Candidiasis, unspecified: Secondary | ICD-10-CM | POA: Diagnosis not present

## 2024-08-29 DIAGNOSIS — Z7983 Long term (current) use of bisphosphonates: Secondary | ICD-10-CM | POA: Insufficient documentation

## 2024-08-29 DIAGNOSIS — M810 Age-related osteoporosis without current pathological fracture: Secondary | ICD-10-CM | POA: Insufficient documentation

## 2024-08-29 DIAGNOSIS — I700 Atherosclerosis of aorta: Secondary | ICD-10-CM | POA: Insufficient documentation

## 2024-08-29 DIAGNOSIS — Z7901 Long term (current) use of anticoagulants: Secondary | ICD-10-CM | POA: Insufficient documentation

## 2024-08-29 DIAGNOSIS — C50812 Malignant neoplasm of overlapping sites of left female breast: Secondary | ICD-10-CM

## 2024-08-29 DIAGNOSIS — C7800 Secondary malignant neoplasm of unspecified lung: Secondary | ICD-10-CM | POA: Insufficient documentation

## 2024-08-29 DIAGNOSIS — Z8542 Personal history of malignant neoplasm of other parts of uterus: Secondary | ICD-10-CM | POA: Diagnosis not present

## 2024-08-29 DIAGNOSIS — M4802 Spinal stenosis, cervical region: Secondary | ICD-10-CM | POA: Diagnosis not present

## 2024-08-29 DIAGNOSIS — C50912 Malignant neoplasm of unspecified site of left female breast: Secondary | ICD-10-CM | POA: Diagnosis not present

## 2024-08-29 DIAGNOSIS — Z79899 Other long term (current) drug therapy: Secondary | ICD-10-CM | POA: Insufficient documentation

## 2024-08-29 DIAGNOSIS — Z881 Allergy status to other antibiotic agents status: Secondary | ICD-10-CM | POA: Diagnosis not present

## 2024-08-29 DIAGNOSIS — M25512 Pain in left shoulder: Secondary | ICD-10-CM | POA: Diagnosis not present

## 2024-08-29 DIAGNOSIS — Z79620 Long term (current) use of immunosuppressive biologic: Secondary | ICD-10-CM | POA: Insufficient documentation

## 2024-08-29 DIAGNOSIS — M2578 Osteophyte, vertebrae: Secondary | ICD-10-CM | POA: Insufficient documentation

## 2024-08-29 DIAGNOSIS — S32401A Unspecified fracture of right acetabulum, initial encounter for closed fracture: Secondary | ICD-10-CM | POA: Insufficient documentation

## 2024-08-29 DIAGNOSIS — C50919 Malignant neoplasm of unspecified site of unspecified female breast: Secondary | ICD-10-CM

## 2024-08-29 DIAGNOSIS — M4804 Spinal stenosis, thoracic region: Secondary | ICD-10-CM | POA: Diagnosis not present

## 2024-08-29 DIAGNOSIS — Z17410 Hormone receptor positive with human epidermal growth factor receptor 2 positive status: Secondary | ICD-10-CM | POA: Insufficient documentation

## 2024-08-29 DIAGNOSIS — B372 Candidiasis of skin and nail: Secondary | ICD-10-CM

## 2024-08-29 DIAGNOSIS — Z9181 History of falling: Secondary | ICD-10-CM | POA: Insufficient documentation

## 2024-08-29 DIAGNOSIS — Z01810 Encounter for preprocedural cardiovascular examination: Secondary | ICD-10-CM

## 2024-08-29 DIAGNOSIS — Z79890 Hormone replacement therapy (postmenopausal): Secondary | ICD-10-CM | POA: Diagnosis not present

## 2024-08-29 DIAGNOSIS — Z5112 Encounter for antineoplastic immunotherapy: Secondary | ICD-10-CM | POA: Diagnosis present

## 2024-08-29 DIAGNOSIS — Z170 Estrogen receptor positive status [ER+]: Secondary | ICD-10-CM | POA: Diagnosis not present

## 2024-08-29 DIAGNOSIS — M1610 Unilateral primary osteoarthritis, unspecified hip: Secondary | ICD-10-CM | POA: Diagnosis not present

## 2024-08-29 DIAGNOSIS — C787 Secondary malignant neoplasm of liver and intrahepatic bile duct: Secondary | ICD-10-CM | POA: Insufficient documentation

## 2024-08-29 DIAGNOSIS — Z800 Family history of malignant neoplasm of digestive organs: Secondary | ICD-10-CM | POA: Insufficient documentation

## 2024-08-29 DIAGNOSIS — S32599A Other specified fracture of unspecified pubis, initial encounter for closed fracture: Secondary | ICD-10-CM | POA: Diagnosis not present

## 2024-08-29 DIAGNOSIS — M4803 Spinal stenosis, cervicothoracic region: Secondary | ICD-10-CM | POA: Diagnosis not present

## 2024-08-29 LAB — CBC WITH DIFFERENTIAL (CANCER CENTER ONLY)
Abs Immature Granulocytes: 0.02 K/uL (ref 0.00–0.07)
Basophils Absolute: 0 K/uL (ref 0.0–0.1)
Basophils Relative: 0 %
Eosinophils Absolute: 0.2 K/uL (ref 0.0–0.5)
Eosinophils Relative: 4 %
HCT: 41.4 % (ref 36.0–46.0)
Hemoglobin: 13.7 g/dL (ref 12.0–15.0)
Immature Granulocytes: 0 %
Lymphocytes Relative: 23 %
Lymphs Abs: 1.4 K/uL (ref 0.7–4.0)
MCH: 32.2 pg (ref 26.0–34.0)
MCHC: 33.1 g/dL (ref 30.0–36.0)
MCV: 97.4 fL (ref 80.0–100.0)
Monocytes Absolute: 0.4 K/uL (ref 0.1–1.0)
Monocytes Relative: 7 %
Neutro Abs: 3.9 K/uL (ref 1.7–7.7)
Neutrophils Relative %: 66 %
Platelet Count: 152 K/uL (ref 150–400)
RBC: 4.25 MIL/uL (ref 3.87–5.11)
RDW: 11.9 % (ref 11.5–15.5)
WBC Count: 5.9 K/uL (ref 4.0–10.5)
nRBC: 0 % (ref 0.0–0.2)

## 2024-08-29 LAB — CMP (CANCER CENTER ONLY)
ALT: 20 U/L (ref 0–44)
AST: 34 U/L (ref 15–41)
Albumin: 4.3 g/dL (ref 3.5–5.0)
Alkaline Phosphatase: 71 U/L (ref 38–126)
Anion gap: 11 (ref 5–15)
BUN: 10 mg/dL (ref 8–23)
CO2: 29 mmol/L (ref 22–32)
Calcium: 9.6 mg/dL (ref 8.9–10.3)
Chloride: 98 mmol/L (ref 98–111)
Creatinine: 0.76 mg/dL (ref 0.44–1.00)
GFR, Estimated: >60 mL/min
Glucose, Bld: 131 mg/dL — ABNORMAL HIGH (ref 70–99)
Potassium: 3.8 mmol/L (ref 3.5–5.1)
Sodium: 137 mmol/L (ref 135–145)
Total Bilirubin: 0.5 mg/dL (ref 0.0–1.2)
Total Protein: 6.8 g/dL (ref 6.5–8.1)

## 2024-08-29 MED ORDER — SODIUM CHLORIDE 0.9 % IV SOLN: Freq: Once | INTRAVENOUS | Status: AC

## 2024-08-29 MED ORDER — TRASTUZUMAB-ANNS CHEMO 150 MG IV SOLR
6.0000 mg/kg | Freq: Once | INTRAVENOUS | Status: AC
  Administered 2024-08-29: 378 mg via INTRAVENOUS
  Filled 2024-08-29: qty 18

## 2024-08-29 MED ORDER — NYSTATIN 100000 UNIT/GM EX POWD: 1.0000 | Freq: Three times a day (TID) | CUTANEOUS | 0 refills | Status: AC

## 2024-08-29 NOTE — Progress Notes (Signed)
 " [facility]  [address] [place],  [place]  [address] [phone]  Clinic Day: 08/29/2024  Referring physician: Jefferey Fitch, MD  CHIEF COMPLAINT:  CC: Stage IV breast cancer  Current Treatment:  Trastuzumab  IV every 3 weeks  HISTORY OF PRESENT ILLNESS:  Karen Allison is a 79 y.o. 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  Estrogen and progesterone receptors were positive and HER2 was positive, and scans revealed liver metastases.  She was placed on a clinical trial at [facility] consisting of letrozole and trastuzumab , but she stopped the letrozole after several months because of severe arthralgias.  She also has known osteoarthritis, degenerative disc disease, and osteoporosis, and has had several back surgeries.  She is been on single agent trastuzumab  now for over 12 years with good control of her disease.  This was stopped temporarily in 2011 but a PET scan a few months later revealed a new 9 mm lung nodule felt to represent recurrent disease, which did resolve after she was placed back on the trastuzumab .  She sees Dr. Burnard Legato at [facility] yearly now, usually in July and he does scans and labs at that time.  We do perform periodic echocardiograms and her last one was in July.  She did have her last bone density scan in July of 2015, with no change, and receives Reclast  every 6 months.  She had cervical disc surgery in September of 2016, and then developed a hospital-acquired pneumonia which took a long time to clear.  She had colonoscopy within the last 2 years.  She had a fall in September with 2 fractured ribs.  Her medication list was reviewed.  When I review her Reclast  doses, she received this every 6 months from July of 2014 to January of 2016, and then yearly, with the last dose in January of 2017.  She was having some tooth sensitivity but  this is no longer an issue.  I did review her family history and the patient had endometrial cancer in her 43's and breast cancer in her 53's. She has a first cousin who had breast cancer and a paternal grandmother who had liver cancer, possibly metastatic.  An aunt had liver cancer.  A step uncle had multiple daughters with breast cancer.  The patient was seen in November of 2017 because of a tenderness and possible lump in the right upper outer quadrant.  A mammogram and ultrasound just revealed normal fibro fatty tissue.  She was evaluated at [facility] by Dr. Burnard Legato in July of 2017 and there were some concerns about her scans.  There appeared to be a rim enhancing fluid collection intramuscular in the left vastus lateralis muscle measuring 2 cm which appeared new.  There was a new right posterior 6th rib expansile sclerotic lesion measuring 2.3 cm as well as an old healed right 5th rib fracture which had been stable from the year before.  She did have an MRI of the hip in August which was nonspecific with extensive postoperative findings in the lumbar spine as well as osteoarthritis, osteophytes, and degenerative changes.  There was a simple appearing fluid collection interposed between the iliotibial band and proximal margin of the vastus lateralis consistent with a small ganglion cyst or prior shearing injury.  We repeated the CT of the chest in January of 2018 to follow up on the  expansile lesion of the rib and this remained stable, and consistent with posttraumatic fracture.  She also fractured her right scapula after a fall and she has fractured several ribs on the right.  Her last ECHO in March 2022 remains normal, with an EF of 55 to 60%.  INTERVAL HISTORY:  Karen Allison is here for a follow up for stage IV breast cancer and is on maintenance Herceptin  for many years. She was originally diagnosed with stage IV breast cancer in 2003 already metastatic to the liver. Hormone receptors and HER2 were positive,  but she did not tolerate hormonal therapy. Her disease has remained stable over 20 years on Herceptin . Patient states that she feels fair and complains of left shoulder pain 7/10.  Her bone density scan on 03/15/2024 revealed T-scores of -1.6 for the left femoral neck . -1.5 for the left total hip, and -2.6 for the left forearm radius, consistent with osteoporosis. She had an MRI cervical spine without contrast on 04/04/2024 which revealed anterior cervical fusion at C3-C4, moderate canal stenosis at C3-C4 secondary to disc osteophyte complex, severe bilateral foraminal stenosis at C3-C4 secondary to uncovertebral joint disease, and moderate right foraminal stenosis at C2-C3 and C7-T1. CT right hip without contrast on 08/02/2024 revealed right total hip arthroplasty with beam hardening artifact partially obscuring the adjacent soft tissue and osseous structures, no periarticular fluid collection or osteolysis, healed right superior and inferior pubic ramus fractures, healed right sacral insufficiency fracture. She continues to have some difficulty with her right hip but will be following up with her orthopedic surgeons. She has candidiasis of the inframammary fold and I prescribed Nystatin  powder. She is scheduled for day 1, cycle 71 of trastuzumab  today. She is scheduled for day 1, cycle 72 of trastuzumab  on 09/20/2024. She has a WBC of 5.9, hemoglobin of 13.7, and platelet count of 152,000. Her CMP is completely normal. She will see Dr. Monetta in February for an echocardiogram. I will see her back in 3 months with CBC, CMP, and CT scans of chest, abdomen, and pelvis.  She will be due for her Prolia  injection in April and mammogram in July.   She denies fever, chills, night sweats, or other signs of infection. She denies cardiorespiratory and gastrointestinal issues. Her appetite is good and Her weight has decreased 2 pounds over last 3 weeks.   Patient states that she feels well but complains of left shoulder  pain. She has been having problems with getting a properly fitting bra. I gave her a pamphlet for [employer] in [place]. She had a severe fracture of her right hip with complete reconstruction surgery and she is finally healing well. She had a PET scan done on 12/04/2023 which revealed a small 5 mm subcutaneous nodule along the anterior aspect of the right anterior deltoid muscle with hypermetabolism with no other findings for metastatic disease. Stable extensive surgical changes related to the right acetabular fractures with diffuse not unexpected hypermetabolism, stable fusion hardware involving the spine, and stable advanced arthrosclerotic calcifications involving the aorta and iliac arteries were also noted. She had a screening bilateral mammogram done on 02/22/2024 which was clear. I brought her in today to discuss the PET scan and we reviewed the images together. When I examine her I cannot find an corresponding lesion of the right anterior shoulder, which appears to be right beneath the skin. There might be a small soft cyst but nothing definite. At this time we have decided to just monitor this area since  it was a solitary finding and not likely malignant. I will continue to examine her every 6 months and she will tell me if something develops sooner than that. She does physical therapy multiple times a month.  She informed me that her orthopedic surgeon instructed her to hold off on her Prolia  injection until her follow-up with him in August due to concern of healing of the bone. Her day 1 cycle 63 of Kanjinti  is scheduled on 03/15/2024. Her last bone density scan was done in May, 2023 and will schedule this now. I will see her back in  6 months with CBC and CMP. She denies fever, chills, night sweats, or other signs of infection. She denies cardiorespiratory and gastrointestinal issues. She  denies pain. Her appetite is good and Her weight has decreased 3 pounds over last 4 weeks.   REVIEW OF  SYSTEMS:  Review of Systems  Constitutional: Negative.  Negative for appetite change, chills, diaphoresis, fatigue, fever and unexpected weight change.  HENT:  Negative.  Negative for hearing loss, lump/mass, mouth sores, nosebleeds, sore throat, tinnitus, trouble swallowing and voice change.   Eyes: Negative.  Negative for eye problems and icterus.  Respiratory: Negative.  Negative for chest tightness, cough, hemoptysis, shortness of breath and wheezing.   Cardiovascular: Negative.  Negative for chest pain, leg swelling and palpitations.  Gastrointestinal: Negative.  Negative for abdominal distention, abdominal pain, blood in stool, constipation, diarrhea, nausea, rectal pain and vomiting.  Endocrine: Negative.   Genitourinary: Negative.  Negative for bladder incontinence, difficulty urinating, dyspareunia, dysuria, frequency, hematuria, menstrual problem, nocturia, pelvic pain, vaginal bleeding and vaginal discharge.   Musculoskeletal:  Positive for arthralgias, back pain and gait problem (using a walker). Negative for flank pain, myalgias, neck pain and neck stiffness.       She has osteopenia with many prior fractures severe right hip, leg, and groin pain Pain in her left shoulder 7/10  Skin: Negative.  Negative for itching, rash and wound.  Neurological:  Positive for gait problem (using a walker). Negative for dizziness, extremity weakness, headaches, light-headedness, numbness, seizures and speech difficulty.  Hematological: Negative.  Negative for adenopathy. Does not bruise/bleed easily.  Psychiatric/Behavioral:  Negative for confusion, decreased concentration, depression, sleep disturbance and suicidal ideas. The patient is not nervous/anxious.     VITALS:  Blood pressure 130/60, pulse 85, temperature 98.3 F (36.8 C), temperature source Oral, resp. rate 18, height 5' 1 (1.549 m), weight 138 lb 3.2 oz (62.7 kg), SpO2 99%.  Wt Readings from Last 3 Encounters:  08/29/24 138 lb 3.2 oz  (62.7 kg)  08/09/24 140 lb (63.5 kg)  06/07/24 137 lb (62.1 kg)    Body mass index is 26.11 kg/m.  Performance status (ECOG): 1 - Symptomatic but completely ambulatory  PHYSICAL EXAM:  Physical Exam Vitals and nursing note reviewed. Exam conducted with a chaperone present.  Constitutional:      General: She is not in acute distress.    Appearance: Normal appearance. She is normal weight. She is not ill-appearing, toxic-appearing or diaphoretic.  HENT:     Head: Normocephalic and atraumatic.     Right Ear: Tympanic membrane, ear canal and external ear normal. There is no impacted cerumen.     Left Ear: Tympanic membrane, ear canal and external ear normal. There is no impacted cerumen.     Nose: Nose normal. No congestion or rhinorrhea.     Mouth/Throat:     Mouth: Mucous membranes are moist.     Pharynx:  Oropharynx is clear. No oropharyngeal exudate or posterior oropharyngeal erythema.  Eyes:     General: No scleral icterus.       Right eye: No discharge.        Left eye: No discharge.     Extraocular Movements: Extraocular movements intact.     Conjunctiva/sclera: Conjunctivae normal.     Pupils: Pupils are equal, round, and reactive to light.  Neck:     Vascular: No carotid bruit.  Cardiovascular:     Rate and Rhythm: Normal rate and regular rhythm.     Pulses: Normal pulses.     Heart sounds: Normal heart sounds. No murmur heard.    No friction rub. No gallop.  Pulmonary:     Effort: Pulmonary effort is normal. No respiratory distress.     Breath sounds: Normal breath sounds. No stridor. No wheezing, rhonchi or rales.  Chest:     Chest wall: No mass or tenderness.  Breasts:    Right: Normal. No mass.     Left: Normal. No mass.     Comments: Well healed scar above the left areolar complex on the left breast. Well healed scar in the left axilla. Both breasts are without masses. Abdominal:     General: Bowel sounds are normal. There is no distension.     Palpations:  Abdomen is soft. There is no hepatomegaly, splenomegaly or mass.     Tenderness: There is no abdominal tenderness. There is no right CVA tenderness, left CVA tenderness, guarding or rebound.     Hernia: No hernia is present.     Comments: Long 4 inch incision in the anterior right hip area which is healing well  Musculoskeletal:        General: No swelling, tenderness, deformity or signs of injury. Normal range of motion.     Cervical back: Normal range of motion and neck supple. No rigidity or tenderness.     Right lower leg: No edema.     Left lower leg: No edema.  Lymphadenopathy:     Cervical: No cervical adenopathy.  Skin:    General: Skin is warm and dry.     Coloration: Skin is not jaundiced or pale.     Findings: No bruising, erythema, lesion or rash.  Neurological:     General: No focal deficit present.     Mental Status: She is alert and oriented to person, place, and time. Mental status is at baseline.     Cranial Nerves: No cranial nerve deficit.     Sensory: No sensory deficit.     Motor: No weakness.     Coordination: Coordination normal.     Gait: Gait normal.     Deep Tendon Reflexes: Reflexes normal.  Psychiatric:        Mood and Affect: Mood normal.        Behavior: Behavior normal.        Thought Content: Thought content normal.        Judgment: Judgment normal.    LABS:      Latest Ref Rng & Units 08/29/2024    2:26 PM 12/23/2023    6:46 AM 12/22/2023    8:48 AM  CBC  WBC 4.0 - 10.5 K/uL 5.9  8.8  9.6   Hemoglobin 12.0 - 15.0 g/dL 86.2  89.2  88.8   Hematocrit 36.0 - 46.0 % 41.4  32.1  33.5   Platelets 150 - 400 K/uL 152  143  165  Latest Ref Rng & Units 08/29/2024    2:26 PM 04/26/2024    3:17 PM 12/23/2023    6:46 AM  CMP  Glucose 70 - 99 mg/dL 868  872  847   BUN 8 - 23 mg/dL 10  11  6    Creatinine 0.44 - 1.00 mg/dL 9.23  9.30  9.40   Sodium 135 - 145 mmol/L 137  134  128   Potassium 3.5 - 5.1 mmol/L 3.8  4.0  4.2   Chloride 98 - 111 mmol/L 98   97  96   CO2 22 - 32 mmol/L 29  25  26    Calcium  8.9 - 10.3 mg/dL 9.6  8.8  7.8   Total Protein 6.5 - 8.1 g/dL 6.8  6.5    Total Bilirubin 0.0 - 1.2 mg/dL 0.5  0.5    Alkaline Phos 38 - 126 U/L 71  96    AST 15 - 41 U/L 34  29    ALT 0 - 44 U/L 20  19     Lab Results  Component Value Date   LDH 230 03/10/2006   Lab Results  Component Value Date   TSH 1.825 07/01/2023   STUDIES:  EXAM: 08/02/2024 CT OF THE RIGHT HIP WITHOUT CONTRAST IMPRESSION: 1. Right total hip arthroplasty with beam hardening artifact partially obscuring the adjacent soft tissue and osseous structures. No periarticular fluid collection or osteolysis. 2. Healed right superior and inferior pubic ramus fractures. 3. Healed right sacral insufficiency fracture.  EXAM: 04/04/2024 MRI CERVICAL SPINE WITHOUT CONTRAST IMPRESSION: 1. Anterior cervical fusion at C3-C4. 2. Moderate canal stenosis at C3-C4 secondary to disc osteophyte complex. 3. Severe bilateral foraminal stenosis at C3-C4 secondary to uncovertebral joint disease. 4. Moderate right foraminal stenosis at C2-C3 and C7-T1.  EXAM: 03/15/2024 DUAL X-RAY ABSORPTIOMETRY (DXA) FOR BONE MINERAL DENSITY FINDINGS: LEFT FEMORAL NECK: T-score: -1.6 LEFT TOTAL HIP: T-score: -1.5 LEFT FOREARM (RADIUS 33%): T-score: -2.6 IMPRESSION: Osteoporosis based on BMD.  EXAM: 02/22/2024 DIGITAL SCREENING BILATERAL MAMMOGRAM WITH TOMOSYNTHESIS AND CAD IMPRESSION: No mammographic evidence of malignancy.   EXAM: 12/04/2023 NUCLEAR MEDICINE PET WHOLE BODY IMPRESSION: 1. Small (5 mm) subcutaneous nodule along the anterior aspect of the right anterior deltoid muscle with hypermetabolism. Indeterminate finding but could not exclude the possibility of a metastatic subcutaneous nodule. MRI may be helpful for further evaluation. 2. No other findings for metastatic disease. 3. Stable extensive surgical changes related to the right acetabular fractures with diffuse not  unexpected hypermetabolism. 4. Stable fusion hardware involving the spine. 5. Stable advanced atherosclerotic calcifications involving the aorta and iliac arteries. 6. Aortic atherosclerosis.  Allergies:  Allergies  Allergen Reactions   Cefuroxime Hives   Zithromax [Azithromycin] Hives    Current Medications: Current Outpatient Medications  Medication Sig Dispense Refill   acetaminophen  (TYLENOL ) 325 MG tablet Take 2 tablets (650 mg total) by mouth 3 (three) times daily as needed for mild pain (pain score 1-3).     apixaban  (ELIQUIS ) 5 MG TABS tablet Take 1 tablet (5 mg total) by mouth daily in the afternoon. 90 tablet 3   B Complex Vitamins (B COMPLEX PO) Take 1 tablet by mouth daily.     Calcium  Carbonate-Vitamin D  (CALTRATE 600+D PO) Take 1 tablet by mouth 2 (two) times daily.     cholecalciferol  (VITAMIN D3) 25 MCG (1000 UNIT) tablet Take 1 tablet (1,000 Units total) by mouth daily. 30 tablet 0   diazepam  (VALIUM ) 10 MG tablet Take 1 tablet (10 mg total)  by mouth at bedtime as needed. 30 tablet 5   diazepam  (VALIUM ) 10 MG tablet Take 1 tablet (10 mg total) by mouth at bedtime as needed. 30 tablet 5   diclofenac  Sodium (VOLTAREN ) 1 % GEL Apply 1 Application topically 4 (four) times daily as needed (pain).     docusate sodium  (COLACE) 100 MG capsule Take 2 capsules (200 mg total) by mouth daily after supper. (Patient taking differently: Take 200 mg by mouth at bedtime.)     estradiol  (ESTRACE ) 0.1 MG/GM vaginal cream PLACE 0.5 GRAMS VAGINALLY NIGHTLY FOR TWO WEEKS THEN 0.5 GRAMS VAGINALLY NIGHTLY TWICE PER WEEK 42.5 g 3   fluconazole (DIFLUCAN) 150 MG tablet Take 150 mg by mouth daily as needed (irritation).     furosemide  (LASIX ) 20 MG tablet Take 20 mg by mouth as needed for edema or fluid.     gabapentin  (NEURONTIN ) 600 MG tablet Take 600-1,200 mg by mouth See admin instructions. 300 mg three times daily, 1200 mg at bedtime     HYDROcodone -acetaminophen  (NORCO) 10-325 MG tablet Take  1 tablet by mouth every 4-6 hours as needed. 150 tablet 0   levothyroxine  (SYNTHROID ) 50 MCG tablet Take 1 tablet (50 mcg total) by mouth daily. 30 tablet 0   methocarbamol  (ROBAXIN ) 500 MG tablet Take 1 tablet (500 mg total) by mouth 4 (four) times daily. 120 tablet 0   metoprolol  succinate (TOPROL -XL) 25 MG 24 hr tablet TAKE 1 TABLET EVERY DAY 90 tablet 2   Multiple Vitamin (MULTIVITAMIN WITH MINERALS) TABS Take 1 tablet by mouth daily.     nystatin  (MYCOSTATIN /NYSTOP ) powder Apply 1 Application topically 3 (three) times daily. 15 g 0   omeprazole  (PRILOSEC) 20 MG capsule Take 1 capsule (20 mg total) by mouth daily. (Patient taking differently: Take 20 mg by mouth daily as needed (acid refux).) 30 capsule 0   ondansetron  (ZOFRAN ) 8 MG tablet Take 8 mg by mouth every 8 (eight) hours as needed for nausea or vomiting.     Potassium Chloride  ER 20 MEQ TBCR Take 20 mEq by mouth daily. Only takes when taking her fluid pill     rosuvastatin  (CRESTOR ) 10 MG tablet Take 10 mg by mouth once a week. Fridays     sennosides-docusate sodium  (SENOKOT-S) 8.6-50 MG tablet Take 2 tablets by mouth 2 (two) times daily. (Patient taking differently: Take 3-4 tablets by mouth at bedtime.)     tamsulosin  (FLOMAX ) 0.4 MG CAPS capsule Take 1 capsule (0.4 mg total) by mouth every evening. 30 capsule 0   trastuzumab  (HERCEPTIN ) 440 MG injection 440 mg by Intravenous (Continuous Infusion) route every 21 ( twenty-one) days. infusion every 3 weeks     No current facility-administered medications for this visit.   ASSESSMENT & PLAN:  Assessment:   1.  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 diagnosed in December 2003.  Subsequent x-rays have shown possible bone metastases as well.  She was treated for a brief time with hormonal therapy but was unable to tolerate that.  She currently is on trastuzumab  every 3 weeks and continues to have her disease under  control. Her scans were done at [facility] in July of 2023, and were clear. She has now asked me to take over her follow up and annual scans and I have agreed. The latest scans from July of 2024 show no evidence of recurrence or metastatic disease. PET scan done on April 14th, 2025 revealed small  5 mm subcutaneous nodule along the anterior aspect of the right anterior deltoid muscle with hypermetabolism with no other findings for metastatic disease. Stable extensive surgical changes related to the right acetabular fractures with diffuse not unexpected hypermetabolism, stable fusion hardware involving the spine, and stable advanced arthrosclerotic calcifications involving the aorta and iliac arteries was also noted. She will due for repeat imaging in April.  2.  Osteoporosis, for which she was on yearly Reclast  since July 2014.  She has had many fractures of spine, ribs, scapula, and now the right acetabulum. However, she has completed 10 doses and so I recommended that we stop the Reclast  and continue to monitor her bone density.  Her bone density scan on 03/15/2024 revealed T-scores of -1.6 for the left femoral neck . -1.5 for the left total hip, and -2.6 for the left forearm radius, consistent with osteoporosis. I feel she does need bone strengthening medicine in view of her many bone fractures. Her last Prolia  injection was in September and we will administer the next one in April after I see her with repeat scans.  3. Multiple old and new rib fractures. She now has to have a right hip replacement due to further problems with the hip and acetabulum. She has also had fractures of the pubic ramus and pelvic bone.  4. Small subcutaneous nodule along the anterior aspect of the right shoulder measuring 5 mm, which had an SUV of 6.22 on the PET scan. I cannot palpate a corresponding lesion on physical exam. Rather than pursue extensive imaging or biopsy, we have agreed to simply follow this area as it is not likely to  be cancer.   Plan: She complains of left shoulder pain 7/10.  Her bone density scan on 03/15/2024 revealed T-scores of -1.6 for the left femoral neck . -1.5 for the left total hip, and -2.6 for the left forearm radius, consistent with osteoporosis. She had an MRI cervical spine without contrast on 04/04/2024 which revealed anterior cervical fusion at C3-C4, moderate canal stenosis at C3-C4 secondary to disc osteophyte complex, severe bilateral foraminal stenosis at C3-C4 secondary to uncovertebral joint disease, and moderate right foraminal stenosis at C2-C3 and C7-T1. CT right hip without contrast on 08/02/2024 revealed right total hip arthroplasty with beam hardening artifact partially obscuring the adjacent soft tissue and osseous structures, no periarticular fluid collection or osteolysis, healed right superior and inferior pubic ramus fractures, healed right sacral insufficiency fracture. She continues to have some difficulty with her right hip but will be following up with her orthopedic surgeons. She has candidiasis of the inframammary fold and I prescribed Nystatin  powder. She is scheduled for day 1, cycle 71 of trastuzumab  today. She is scheduled for day 1, cycle 72 of trastuzumab  on 09/20/2024. She has a WBC of 5.9, hemoglobin of 13.7, and platelet count of 152,000. Her CMP is completely normal. She will see Dr. Monetta in February for an echocardiogram. I will see her back in 3 months with CBC, CMP, and CT scans of chest, abdomen, and pelvis.  She will be due for her Prolia  injection in April and mammogram in July. She understands and agrees with this plan of care.   I provided 28 minutes of face-to-face time during this this encounter and > 50% was spent counseling as documented under my assessment and plan.   Wanda VEAR Cornish, MD [facility] [place] - A DEPT OF [place]. [facility] [address]   [address]  Dept: [phone] Dept Fax:  [phone]   No orders of the defined types were placed in this encounter.  LILLETTE Aretta Cook, acting as a scribe for Wanda VEAR Cornish, MD, have documented all relevant documentation on the behalf of Wanda VEAR Cornish, MD, as directed by Wanda VEAR Cornish, MD, while in the presence of Wanda VEAR Cornish, MD.  I have reviewed this report as typed by the medical scribe, and it is complete and accurate.    "

## 2024-08-29 NOTE — Patient Instructions (Signed)
 Trastuzumab  Injection What is this medication? TRASTUZUMAB  (tras TOO zoo mab) treats breast cancer and stomach cancer. It works by blocking a protein that causes cancer cells to grow and multiply. This helps to slow or stop the spread of cancer cells. This medicine may be used for other purposes; ask your health care provider or pharmacist if you have questions. COMMON BRAND NAME(S): Herceptin , HERCESSI, Herzuma , KANJINTI , Ogivri , Ontruzant , Trazimera  What should I tell my care team before I take this medication? They need to know if you have any of these conditions: Heart failure Lung disease An unusual or allergic reaction to trastuzumab , other medications, foods, dyes, or preservatives Pregnant or trying to get pregnant Breast-feeding How should I use this medication? This medication is injected into a vein. It is given by your care team in a hospital or clinic setting. Talk to your care team about the use of this medication in children. It is not approved for use in children. Overdosage: If you think you have taken too much of this medicine contact a poison control center or emergency room at once. NOTE: This medicine is only for you. Do not share this medicine with others. What if I miss a dose? Keep appointments for follow-up doses. It is important not to miss your dose. Call your care team if you are unable to keep an appointment. What may interact with this medication? Certain types of chemotherapy, such as daunorubicin, doxorubicin, epirubicin, idarubicin This list may not describe all possible interactions. Give your health care provider a list of all the medicines, herbs, non-prescription drugs, or dietary supplements you use. Also tell them if you smoke, drink alcohol , or use illegal drugs. Some items may interact with your medicine. What should I watch for while using this medication? Your condition will be monitored carefully while you are receiving this medication. This  medication may make you feel generally unwell. This is not uncommon, as chemotherapy affects healthy cells as well as cancer cells. Report any side effects. Continue your course of treatment even though you feel ill unless your care team tells you to stop. This medication may increase your risk of getting an infection. Call your care team for advice if you get a fever, chills, sore throat, or other symptoms of a cold or flu. Do not treat yourself. Try to avoid being around people who are sick. Avoid taking medications that contain aspirin , acetaminophen , ibuprofen , naproxen, or ketoprofen unless instructed by your care team. These medications can hide a fever. Talk to your care team if you may be pregnant. Serious birth defects can occur if you take this medication during pregnancy and for 7 months after the last dose. You will need a negative pregnancy test before starting this medication. Contraception is recommended while taking this medication and for 7 months after the last dose. Your care team can help you find the option that works for you. Do not breastfeed while taking this medication and for 7 months after stopping treatment. What side effects may I notice from receiving this medication? Side effects that you should report to your care team as soon as possible: Allergic reactions or angioedema--skin rash, itching or hives, swelling of the face, eyes, lips, tongue, arms, or legs, trouble swallowing or breathing Dry cough, shortness of breath or trouble breathing Heart failure--shortness of breath, swelling of the ankles, feet, or hands, sudden weight gain, unusual weakness or fatigue Infection--fever, chills, cough, or sore throat Infusion reactions--chest pain, shortness of breath or trouble breathing, feeling faint  or lightheaded Side effects that usually do not require medical attention (report to your care team if they continue or are  bothersome): Diarrhea Dizziness Headache Nausea Trouble sleeping Vomiting This list may not describe all possible side effects. Call your doctor for medical advice about side effects. You may report side effects to FDA at 1-800-FDA-1088. Where should I keep my medication? This medication is given in a hospital or clinic. It will not be stored at home. NOTE: This sheet is a summary. It may not cover all possible information. If you have questions about this medicine, talk to your doctor, pharmacist, or health care provider.  2024 Elsevier/Gold Standard (2021-12-21 00:00:00) [facility] [place] - A DEPT OF [facility]  Discharge Instructions: Thank you for choosing [facility] to provide your oncology and hematology care.  If you have a lab appointment with the Cancer Center, please go directly to the Cancer Center and check in at the registration area.   Wear comfortable clothing and clothing appropriate for easy access to any Portacath or PICC line.   We strive to give you quality time with your provider. You may need to reschedule your appointment if you arrive late (15 or more minutes).  Arriving late affects you and other patients whose appointments are after yours.  Also, if you miss three or more appointments without notifying the office, you may be dismissed from the clinic at the provider's discretion.      For prescription refill requests, have your pharmacy contact our office and allow 72 hours for refills to be completed.    Today you received the following chemotherapy and/or immunotherapy agents TRASTUZUMAB       To help prevent nausea and vomiting after your treatment, we encourage you to take your nausea medication as directed.  BELOW ARE SYMPTOMS THAT SHOULD BE REPORTED IMMEDIATELY: *FEVER GREATER THAN 100.4 F (38 C) OR HIGHER *CHILLS OR SWEATING *NAUSEA AND VOMITING THAT IS NOT CONTROLLED WITH YOUR NAUSEA MEDICATION *UNUSUAL  SHORTNESS OF BREATH *UNUSUAL BRUISING OR BLEEDING *URINARY PROBLEMS (pain or burning when urinating, or frequent urination) *BOWEL PROBLEMS (unusual diarrhea, constipation, pain near the anus) TENDERNESS IN MOUTH AND THROAT WITH OR WITHOUT PRESENCE OF ULCERS (sore throat, sores in mouth, or a toothache) UNUSUAL RASH, SWELLING OR PAIN  UNUSUAL VAGINAL DISCHARGE OR ITCHING   Items with * indicate a potential emergency and should be followed up as soon as possible or go to the Emergency Department if any problems should occur.  Please show the CHEMOTHERAPY ALERT CARD or IMMUNOTHERAPY ALERT CARD at check-in to the Emergency Department and triage nurse.  Should you have questions after your visit or need to cancel or reschedule your appointment, please contact [facility] [place] - A DEPT OF [facility]  Dept: [phone]  and follow the prompts.  Office hours are 8:00 a.m. to 4:30 p.m. Monday - Friday. Please note that voicemails left after 4:00 p.m. may not be returned until the following business day.  We are closed weekends and major holidays. You have access to a nurse at all times for urgent questions. Please call the main number to the clinic Dept: [phone] and follow the prompts.  For any non-urgent questions, you may also contact your provider using MyChart. We now offer e-Visits for anyone 6 and older to request care online for non-urgent symptoms. For details visit [email or web address].   Also download the MyChart app! Go to the app store, search MyChart,  open the app, select [place], and log in with your MyChart username and password.

## 2024-08-29 NOTE — Telephone Encounter (Signed)
 Patient has been scheduled for follow-up visit per 08/29/2024 LOS.  Pt given an appt calendar with date and time.

## 2024-08-30 ENCOUNTER — Other Ambulatory Visit: Payer: Self-pay

## 2024-08-31 ENCOUNTER — Other Ambulatory Visit: Payer: Self-pay

## 2024-09-02 ENCOUNTER — Encounter: Payer: Self-pay | Admitting: *Deleted

## 2024-09-03 ENCOUNTER — Encounter: Payer: Self-pay | Admitting: Oncology

## 2024-09-04 ENCOUNTER — Other Ambulatory Visit: Payer: Self-pay

## 2024-09-04 ENCOUNTER — Ambulatory Visit: Admitting: Orthopaedic Surgery

## 2024-09-19 ENCOUNTER — Other Ambulatory Visit (HOSPITAL_BASED_OUTPATIENT_CLINIC_OR_DEPARTMENT_OTHER): Payer: Self-pay

## 2024-09-19 ENCOUNTER — Inpatient Hospital Stay

## 2024-09-19 MED ORDER — HYDROCODONE-ACETAMINOPHEN 10-325 MG PO TABS
1.0000 | ORAL_TABLET | ORAL | 0 refills | Status: AC
  Filled 2024-09-19: qty 150, 25d supply, fill #0

## 2024-09-20 ENCOUNTER — Inpatient Hospital Stay

## 2024-09-20 VITALS — BP 140/81 | HR 94 | Temp 98.4°F | Resp 18

## 2024-09-20 DIAGNOSIS — C50912 Malignant neoplasm of unspecified site of left female breast: Secondary | ICD-10-CM

## 2024-09-20 DIAGNOSIS — Z5112 Encounter for antineoplastic immunotherapy: Secondary | ICD-10-CM | POA: Diagnosis not present

## 2024-09-20 MED ORDER — TRASTUZUMAB-ANNS CHEMO 150 MG IV SOLR
6.0000 mg/kg | Freq: Once | INTRAVENOUS | Status: AC
  Administered 2024-09-20: 378 mg via INTRAVENOUS
  Filled 2024-09-20: qty 18

## 2024-09-20 MED ORDER — SODIUM CHLORIDE 0.9 % IV SOLN: Freq: Once | INTRAVENOUS | Status: AC

## 2024-09-20 NOTE — Patient Instructions (Signed)
 Trastuzumab  Injection What is this medication? TRASTUZUMAB  (tras TOO zoo mab) treats breast cancer and stomach cancer. It works by blocking a protein that causes cancer cells to grow and multiply. This helps to slow or stop the spread of cancer cells. This medicine may be used for other purposes; ask your health care provider or pharmacist if you have questions. COMMON BRAND NAME(S): Herceptin , HERCESSI, Herzuma , KANJINTI , Ogivri , Ontruzant , Trazimera  What should I tell my care team before I take this medication? They need to know if you have any of these conditions: Heart failure Lung disease An unusual or allergic reaction to trastuzumab , other medications, foods, dyes, or preservatives Pregnant or trying to get pregnant Breast-feeding How should I use this medication? This medication is infused into a vein. It is given by your care team in a hospital or clinic setting. Talk to your care team about the use of this medication in children. Special care may be needed. Overdosage: If you think you have taken too much of this medicine contact a poison control center or emergency room at once. NOTE: This medicine is only for you. Do not share this medicine with others. What if I miss a dose? Keep appointments for follow-up doses. It is important not to miss your dose. Call your care team if you are unable to keep an appointment. What may interact with this medication? Certain types of chemotherapy, such as daunorubicin, doxorubicin, epirubicin, idarubicin This list may not describe all possible interactions. Give your health care provider a list of all the medicines, herbs, non-prescription drugs, or dietary supplements you use. Also tell them if you smoke, drink alcohol, or use illegal drugs. Some items may interact with your medicine. What should I watch for while using this medication? Your condition will be monitored carefully while you are receiving this medication. This medication may make  you feel generally unwell. This is not uncommon, as chemotherapy affects healthy cells as well as cancer cells. Report any side effects. Continue your course of treatment even though you feel ill unless your care team tells you to stop. This medication may increase your risk of getting an infection. Call your care team for advice if you get a fever, chills, sore throat, or other symptoms of a cold or flu. Do not treat yourself. Try to avoid being around people who are sick. Avoid taking medications that contain aspirin, acetaminophen , ibuprofen, naproxen, or ketoprofen unless instructed by your care team. These medications can hide a fever. Talk to your care team if you may be pregnant. Serious birth defects can occur if you take this medication during pregnancy and for 7 months after the last dose. You will need a negative pregnancy test before starting this medication. Contraception is recommended while taking this medication and for 7 months after the last dose. Your care team can help you find the option that works for you. Do not breastfeed while taking this medication and for 7 months after stopping treatment. What side effects may I notice from receiving this medication? Side effects that you should report to your care team as soon as possible: Allergic reactions or angioedema--skin rash, itching or hives, swelling of the face, eyes, lips, tongue, arms, or legs, trouble swallowing or breathing Dry cough, shortness of breath or trouble breathing Heart failure--shortness of breath, swelling of the ankles, feet, or hands, sudden weight gain, unusual weakness or fatigue Infection--fever, chills, cough, or sore throat Infusion reactions--chest pain, shortness of breath or trouble breathing, feeling faint or lightheaded Side  effects that usually do not require medical attention (report to your care team if they continue or are bothersome): Diarrhea Dizziness Headache Nausea Trouble  sleeping Vomiting This list may not describe all possible side effects. Call your doctor for medical advice about side effects. You may report side effects to FDA at 1-800-FDA-1088. Where should I keep my medication? This medication is given in a hospital or clinic. It will not be stored at home. NOTE: This sheet is a summary. It may not cover all possible information. If you have questions about this medicine, talk to your doctor, pharmacist, or health care provider.  2025 [employer] Standard (2024-06-13 00:00:00)

## 2024-09-25 ENCOUNTER — Other Ambulatory Visit: Payer: Self-pay

## 2024-09-26 ENCOUNTER — Other Ambulatory Visit (HOSPITAL_BASED_OUTPATIENT_CLINIC_OR_DEPARTMENT_OTHER): Payer: Self-pay

## 2024-09-27 ENCOUNTER — Ambulatory Visit

## 2024-09-27 DIAGNOSIS — C50919 Malignant neoplasm of unspecified site of unspecified female breast: Secondary | ICD-10-CM

## 2024-09-27 DIAGNOSIS — Z01810 Encounter for preprocedural cardiovascular examination: Secondary | ICD-10-CM

## 2024-09-27 LAB — ECHOCARDIOGRAM COMPLETE
AV Vena cont: 0.2 cm
Area-P 1/2: 4.54 cm2
MV M vel: 5.51 m/s
MV Peak grad: 121.4 mmHg
P 1/2 time: 341 ms
S' Lateral: 3.1 cm

## 2024-10-02 ENCOUNTER — Ambulatory Visit: Admitting: Orthopaedic Surgery

## 2024-10-07 ENCOUNTER — Ambulatory Visit: Admitting: Orthopaedic Surgery

## 2024-10-10 ENCOUNTER — Inpatient Hospital Stay

## 2024-10-11 ENCOUNTER — Inpatient Hospital Stay

## 2024-10-31 ENCOUNTER — Inpatient Hospital Stay

## 2024-11-01 ENCOUNTER — Inpatient Hospital Stay

## 2024-11-21 ENCOUNTER — Inpatient Hospital Stay

## 2024-11-22 ENCOUNTER — Inpatient Hospital Stay

## 2024-11-28 ENCOUNTER — Other Ambulatory Visit (HOSPITAL_BASED_OUTPATIENT_CLINIC_OR_DEPARTMENT_OTHER): Admitting: Radiology

## 2024-11-28 ENCOUNTER — Inpatient Hospital Stay

## 2024-12-04 ENCOUNTER — Inpatient Hospital Stay: Admitting: Oncology

## 2024-12-04 ENCOUNTER — Inpatient Hospital Stay

## 2024-12-12 ENCOUNTER — Inpatient Hospital Stay

## 2024-12-13 ENCOUNTER — Inpatient Hospital Stay

## 2025-01-15 ENCOUNTER — Ambulatory Visit: Admitting: Cardiology
